# Patient Record
Sex: Female | Born: 1940 | Race: Black or African American | Hispanic: No | Smoking: Former smoker
Health system: Southern US, Community
[De-identification: ages and names within clinical notes are randomized; demographics above are authoritative.]

## PROBLEM LIST (undated history)

## (undated) DIAGNOSIS — E13319 Other specified diabetes mellitus with unspecified diabetic retinopathy without macular edema: Secondary | ICD-10-CM

## (undated) DIAGNOSIS — O039 Complete or unspecified spontaneous abortion without complication: Secondary | ICD-10-CM

## (undated) DIAGNOSIS — M545 Low back pain, unspecified: Secondary | ICD-10-CM

## (undated) DIAGNOSIS — I219 Acute myocardial infarction, unspecified: Secondary | ICD-10-CM

## (undated) DIAGNOSIS — N19 Unspecified kidney failure: Secondary | ICD-10-CM

## (undated) DIAGNOSIS — I499 Cardiac arrhythmia, unspecified: Secondary | ICD-10-CM

## (undated) DIAGNOSIS — F419 Anxiety disorder, unspecified: Secondary | ICD-10-CM

## (undated) DIAGNOSIS — I10 Essential (primary) hypertension: Secondary | ICD-10-CM

## (undated) DIAGNOSIS — N289 Disorder of kidney and ureter, unspecified: Secondary | ICD-10-CM

## (undated) DIAGNOSIS — Z860100 Personal history of colon polyps, unspecified: Secondary | ICD-10-CM

## (undated) DIAGNOSIS — E119 Type 2 diabetes mellitus without complications: Secondary | ICD-10-CM

## (undated) DIAGNOSIS — D649 Anemia, unspecified: Secondary | ICD-10-CM

## (undated) DIAGNOSIS — IMO0001 Reserved for inherently not codable concepts without codable children: Secondary | ICD-10-CM

## (undated) DIAGNOSIS — K219 Gastro-esophageal reflux disease without esophagitis: Secondary | ICD-10-CM

## (undated) DIAGNOSIS — E1321 Other specified diabetes mellitus with diabetic nephropathy: Secondary | ICD-10-CM

## (undated) DIAGNOSIS — E785 Hyperlipidemia, unspecified: Secondary | ICD-10-CM

## (undated) DIAGNOSIS — M109 Gout, unspecified: Secondary | ICD-10-CM

## (undated) DIAGNOSIS — G629 Polyneuropathy, unspecified: Secondary | ICD-10-CM

## (undated) DIAGNOSIS — Z992 Dependence on renal dialysis: Secondary | ICD-10-CM

## (undated) DIAGNOSIS — I2510 Atherosclerotic heart disease of native coronary artery without angina pectoris: Secondary | ICD-10-CM

## (undated) HISTORY — DX: Gastro-esophageal reflux disease without esophagitis: K21.9

## (undated) HISTORY — DX: Personal history of colonic polyps: Z86.010

## (undated) HISTORY — DX: Essential (primary) hypertension: I10

## (undated) HISTORY — DX: Type 2 diabetes mellitus without complications: E11.9

## (undated) HISTORY — PX: CHOLECYSTECTOMY: SHX55

## (undated) HISTORY — PX: PERITONEAL CATHETER INSERTION: SHX2223

## (undated) HISTORY — DX: Hyperlipidemia, unspecified: E78.5

## (undated) HISTORY — DX: Disorder of kidney and ureter, unspecified: N28.9

## (undated) HISTORY — DX: Personal history of colon polyps, unspecified: Z86.0100

## (undated) HISTORY — DX: Other specified diabetes mellitus with unspecified diabetic retinopathy without macular edema: E13.319

## (undated) HISTORY — DX: Other specified diabetes mellitus with diabetic nephropathy: E13.21

## (undated) HISTORY — DX: Anemia, unspecified: D64.9

## (undated) HISTORY — DX: Reserved for inherently not codable concepts without codable children: IMO0001

## (undated) HISTORY — DX: Unspecified kidney failure: N19

## (undated) HISTORY — DX: Complete or unspecified spontaneous abortion without complication: O03.9

---

## 1981-11-05 HISTORY — PX: ABDOMINAL HYSTERECTOMY: SHX81

## 1985-11-05 HISTORY — PX: BACK SURGERY: SHX140

## 2004-08-05 ENCOUNTER — Ambulatory Visit: Payer: Self-pay | Admitting: Internal Medicine

## 2004-09-21 ENCOUNTER — Ambulatory Visit: Payer: Self-pay | Admitting: Internal Medicine

## 2004-10-16 ENCOUNTER — Ambulatory Visit: Payer: Self-pay | Admitting: Internal Medicine

## 2004-11-05 ENCOUNTER — Ambulatory Visit: Payer: Self-pay | Admitting: Internal Medicine

## 2004-12-06 ENCOUNTER — Ambulatory Visit: Payer: Self-pay | Admitting: Internal Medicine

## 2005-01-17 ENCOUNTER — Ambulatory Visit: Payer: Self-pay | Admitting: Internal Medicine

## 2005-01-19 ENCOUNTER — Ambulatory Visit: Payer: Self-pay | Admitting: Internal Medicine

## 2005-02-03 ENCOUNTER — Ambulatory Visit: Payer: Self-pay | Admitting: Internal Medicine

## 2005-03-05 ENCOUNTER — Ambulatory Visit: Payer: Self-pay | Admitting: Internal Medicine

## 2005-03-07 ENCOUNTER — Ambulatory Visit: Payer: Self-pay | Admitting: Internal Medicine

## 2005-05-04 ENCOUNTER — Ambulatory Visit: Payer: Self-pay | Admitting: Gastroenterology

## 2005-05-23 ENCOUNTER — Ambulatory Visit: Payer: Self-pay | Admitting: Internal Medicine

## 2005-05-30 ENCOUNTER — Ambulatory Visit: Payer: Self-pay | Admitting: Internal Medicine

## 2005-06-14 ENCOUNTER — Ambulatory Visit: Payer: Self-pay | Admitting: Internal Medicine

## 2005-07-06 ENCOUNTER — Ambulatory Visit: Payer: Self-pay | Admitting: Internal Medicine

## 2005-08-05 ENCOUNTER — Ambulatory Visit: Payer: Self-pay | Admitting: Internal Medicine

## 2005-09-07 ENCOUNTER — Ambulatory Visit: Payer: Self-pay | Admitting: Internal Medicine

## 2005-10-16 ENCOUNTER — Ambulatory Visit: Payer: Self-pay | Admitting: Internal Medicine

## 2005-11-02 ENCOUNTER — Ambulatory Visit: Payer: Self-pay | Admitting: Family Medicine

## 2006-01-01 ENCOUNTER — Ambulatory Visit: Payer: Self-pay | Admitting: Family Medicine

## 2006-01-17 ENCOUNTER — Ambulatory Visit: Payer: Self-pay | Admitting: Internal Medicine

## 2006-02-03 ENCOUNTER — Ambulatory Visit: Payer: Self-pay | Admitting: Internal Medicine

## 2006-02-14 ENCOUNTER — Ambulatory Visit: Payer: Self-pay | Admitting: Internal Medicine

## 2006-05-01 ENCOUNTER — Ambulatory Visit: Payer: Self-pay | Admitting: Internal Medicine

## 2006-05-05 ENCOUNTER — Ambulatory Visit: Payer: Self-pay | Admitting: Internal Medicine

## 2006-06-03 ENCOUNTER — Ambulatory Visit: Payer: Self-pay | Admitting: Gastroenterology

## 2006-06-03 LAB — HM COLONOSCOPY

## 2006-06-05 ENCOUNTER — Ambulatory Visit: Payer: Self-pay | Admitting: Internal Medicine

## 2006-08-19 ENCOUNTER — Ambulatory Visit: Payer: Self-pay | Admitting: Internal Medicine

## 2006-08-27 ENCOUNTER — Ambulatory Visit: Payer: Self-pay

## 2006-09-22 ENCOUNTER — Other Ambulatory Visit: Payer: Self-pay

## 2006-09-22 ENCOUNTER — Emergency Department: Payer: Self-pay | Admitting: Emergency Medicine

## 2006-10-23 ENCOUNTER — Ambulatory Visit: Payer: Self-pay | Admitting: Internal Medicine

## 2006-11-22 ENCOUNTER — Ambulatory Visit: Payer: Self-pay | Admitting: Internal Medicine

## 2006-11-28 ENCOUNTER — Ambulatory Visit: Payer: Self-pay | Admitting: Family Medicine

## 2006-12-06 ENCOUNTER — Ambulatory Visit: Payer: Self-pay | Admitting: Internal Medicine

## 2007-01-14 ENCOUNTER — Ambulatory Visit: Payer: Self-pay | Admitting: Internal Medicine

## 2007-01-20 ENCOUNTER — Encounter: Payer: Self-pay | Admitting: Orthopedic Surgery

## 2007-02-04 ENCOUNTER — Encounter: Payer: Self-pay | Admitting: Orthopedic Surgery

## 2007-02-19 ENCOUNTER — Ambulatory Visit: Payer: Self-pay | Admitting: Internal Medicine

## 2007-02-19 LAB — CONVERTED CEMR LAB
ALT: 18 units/L (ref 0–40)
Albumin: 3.2 g/dL — ABNORMAL LOW (ref 3.5–5.2)
BUN: 35 mg/dL — ABNORMAL HIGH (ref 6–23)
CO2: 27 meq/L (ref 19–32)
Cholesterol: 137 mg/dL (ref 0–200)
Creatinine,U: 147.5 mg/dL
Eosinophils Absolute: 0.2 10*3/uL (ref 0.0–0.6)
GFR calc non Af Amer: 30 mL/min
HDL: 52.8 mg/dL (ref >39.0)
Hemoglobin: 8.9 g/dL — ABNORMAL LOW (ref 12.0–15.0)
Hgb A1c MFr Bld: 7 % — ABNORMAL HIGH (ref 4.6–6.0)
Lymphocytes Relative: 29.4 % (ref 12.0–46.0)
MCHC: 34.3 g/dL (ref 30.0–36.0)
MCV: 94.2 fL (ref 78.0–100.0)
Monocytes Absolute: 0.5 10*3/uL (ref 0.2–0.7)
Monocytes Relative: 8 % (ref 3.0–11.0)
Neutro Abs: 4 10*3/uL (ref 1.4–7.7)
Phosphorus: 4.2 mg/dL (ref 2.3–4.6)
Platelets: 235 10*3/uL (ref 150–400)
Potassium: 4.1 meq/L (ref 3.5–5.1)
Sodium: 143 meq/L (ref 135–145)
Triglycerides: 93 mg/dL (ref 0–149)

## 2007-02-22 ENCOUNTER — Emergency Department: Payer: Self-pay | Admitting: Emergency Medicine

## 2007-02-25 ENCOUNTER — Emergency Department: Payer: Self-pay | Admitting: Emergency Medicine

## 2007-02-28 ENCOUNTER — Encounter: Payer: Self-pay | Admitting: Internal Medicine

## 2007-02-28 ENCOUNTER — Telehealth (INDEPENDENT_AMBULATORY_CARE_PROVIDER_SITE_OTHER): Payer: Self-pay | Admitting: *Deleted

## 2007-02-28 DIAGNOSIS — M545 Low back pain: Secondary | ICD-10-CM

## 2007-03-06 ENCOUNTER — Encounter: Payer: Self-pay | Admitting: Orthopedic Surgery

## 2007-03-11 ENCOUNTER — Telehealth (INDEPENDENT_AMBULATORY_CARE_PROVIDER_SITE_OTHER): Payer: Self-pay | Admitting: *Deleted

## 2007-03-12 ENCOUNTER — Ambulatory Visit: Payer: Self-pay | Admitting: Internal Medicine

## 2007-03-12 LAB — CONVERTED CEMR LAB
Glucose, Urine, Semiquant: NEGATIVE
Ketones, urine, test strip: NEGATIVE
Urobilinogen, UA: 0.2
pH: 5

## 2007-03-13 ENCOUNTER — Encounter: Admission: RE | Admit: 2007-03-13 | Discharge: 2007-03-13 | Payer: Self-pay | Admitting: Orthopedic Surgery

## 2007-04-06 HISTORY — PX: BACK SURGERY: SHX140

## 2007-04-17 ENCOUNTER — Inpatient Hospital Stay (HOSPITAL_COMMUNITY): Admission: RE | Admit: 2007-04-17 | Discharge: 2007-04-19 | Payer: Self-pay | Admitting: Orthopedic Surgery

## 2007-05-06 ENCOUNTER — Ambulatory Visit: Payer: Self-pay | Admitting: Internal Medicine

## 2007-05-14 DIAGNOSIS — I10 Essential (primary) hypertension: Secondary | ICD-10-CM

## 2007-05-14 DIAGNOSIS — E785 Hyperlipidemia, unspecified: Secondary | ICD-10-CM | POA: Insufficient documentation

## 2007-05-14 DIAGNOSIS — D539 Nutritional anemia, unspecified: Secondary | ICD-10-CM | POA: Insufficient documentation

## 2007-05-21 ENCOUNTER — Ambulatory Visit: Payer: Self-pay | Admitting: Internal Medicine

## 2007-05-22 LAB — CONVERTED CEMR LAB
Albumin: 3.2 g/dL — ABNORMAL LOW (ref 3.5–5.2)
Basophils Relative: 0.7 % (ref 0.0–1.0)
CO2: 27 meq/L (ref 19–32)
Chloride: 111 meq/L (ref 96–112)
Creatinine, Ser: 1.9 mg/dL — ABNORMAL HIGH (ref 0.4–1.2)
Eosinophils Relative: 3.8 % (ref 0.0–5.0)
GFR calc non Af Amer: 28 mL/min
Glucose, Bld: 134 mg/dL — ABNORMAL HIGH (ref 70–99)
HCT: 25.5 % — ABNORMAL LOW (ref 36.0–46.0)
HDL: 42.4 mg/dL (ref >39.0)
Hemoglobin: 8.8 g/dL — ABNORMAL LOW (ref 12.0–15.0)
LDL Cholesterol: 74 mg/dL (ref 0–99)
Lymphocytes Relative: 33 % (ref 12.0–46.0)
Monocytes Absolute: 0.5 10*3/uL (ref 0.2–0.7)
Neutro Abs: 3.6 10*3/uL (ref 1.4–7.7)
Neutrophils Relative %: 54.4 % (ref 43.0–77.0)
Phosphorus: 3.6 mg/dL (ref 2.3–4.6)
RDW: 15.2 % — ABNORMAL HIGH (ref 11.5–14.6)
Sodium: 147 meq/L — ABNORMAL HIGH (ref 135–145)
TSH: 1.15 microintl units/mL (ref 0.35–5.50)
VLDL: 19 mg/dL (ref 0–40)
WBC: 6.6 10*3/uL (ref 4.5–10.5)

## 2007-06-05 ENCOUNTER — Encounter: Payer: Self-pay | Admitting: Internal Medicine

## 2007-06-06 ENCOUNTER — Ambulatory Visit: Payer: Self-pay | Admitting: Internal Medicine

## 2007-06-10 ENCOUNTER — Encounter: Payer: Self-pay | Admitting: Orthopedic Surgery

## 2007-06-27 ENCOUNTER — Ambulatory Visit: Payer: Self-pay | Admitting: Internal Medicine

## 2007-07-07 ENCOUNTER — Ambulatory Visit: Payer: Self-pay | Admitting: Internal Medicine

## 2007-07-07 ENCOUNTER — Encounter: Payer: Self-pay | Admitting: Orthopedic Surgery

## 2007-07-18 ENCOUNTER — Ambulatory Visit: Payer: Self-pay | Admitting: Family Medicine

## 2007-07-18 DIAGNOSIS — J0110 Acute frontal sinusitis, unspecified: Secondary | ICD-10-CM

## 2007-08-06 ENCOUNTER — Encounter: Payer: Self-pay | Admitting: Orthopedic Surgery

## 2007-09-25 ENCOUNTER — Ambulatory Visit: Payer: Self-pay | Admitting: Internal Medicine

## 2007-10-06 ENCOUNTER — Ambulatory Visit: Payer: Self-pay | Admitting: Internal Medicine

## 2007-10-22 ENCOUNTER — Ambulatory Visit: Payer: Self-pay | Admitting: Internal Medicine

## 2007-10-23 LAB — CONVERTED CEMR LAB
Albumin: 3.6 g/dL (ref 3.5–5.2)
BUN: 45 mg/dL — ABNORMAL HIGH (ref 6–23)
Basophils Absolute: 0 10*3/uL (ref 0.0–0.1)
Bilirubin, Direct: 0.1 mg/dL (ref 0.0–0.3)
Chloride: 109 meq/L (ref 96–112)
Cholesterol: 143 mg/dL (ref 0–200)
Creatinine, Ser: 2.1 mg/dL — ABNORMAL HIGH (ref 0.4–1.2)
Eosinophils Absolute: 0.4 10*3/uL (ref 0.0–0.6)
GFR calc non Af Amer: 25 mL/min
HCT: 29.5 % — ABNORMAL LOW (ref 36.0–46.0)
HDL: 47.2 mg/dL (ref >39.0)
Hgb A1c MFr Bld: 7.6 % — ABNORMAL HIGH (ref 4.6–6.0)
MCHC: 33.3 g/dL (ref 30.0–36.0)
MCV: 92.2 fL (ref 78.0–100.0)
Monocytes Absolute: 0.5 10*3/uL (ref 0.2–0.7)
Monocytes Relative: 6.9 % (ref 3.0–11.0)
Neutrophils Relative %: 56.8 % (ref 43.0–77.0)
RBC: 3.2 M/uL — ABNORMAL LOW (ref 3.87–5.11)
TSH: 0.98 microintl units/mL (ref 0.35–5.50)
Total CHOL/HDL Ratio: 3

## 2007-11-27 ENCOUNTER — Encounter: Payer: Self-pay | Admitting: Internal Medicine

## 2007-11-27 ENCOUNTER — Ambulatory Visit: Payer: Self-pay | Admitting: Internal Medicine

## 2007-12-01 ENCOUNTER — Encounter (INDEPENDENT_AMBULATORY_CARE_PROVIDER_SITE_OTHER): Payer: Self-pay | Admitting: *Deleted

## 2007-12-07 ENCOUNTER — Ambulatory Visit: Payer: Self-pay | Admitting: Internal Medicine

## 2008-01-20 ENCOUNTER — Telehealth (INDEPENDENT_AMBULATORY_CARE_PROVIDER_SITE_OTHER): Payer: Self-pay | Admitting: *Deleted

## 2008-02-04 ENCOUNTER — Ambulatory Visit: Payer: Self-pay | Admitting: Internal Medicine

## 2008-02-17 ENCOUNTER — Telehealth: Payer: Self-pay | Admitting: Family Medicine

## 2008-02-23 ENCOUNTER — Ambulatory Visit: Payer: Self-pay | Admitting: Internal Medicine

## 2008-02-23 DIAGNOSIS — K219 Gastro-esophageal reflux disease without esophagitis: Secondary | ICD-10-CM

## 2008-02-26 LAB — CONVERTED CEMR LAB
ALT: 16 units/L (ref 0–35)
Basophils Absolute: 0.1 10*3/uL (ref 0.0–0.1)
Cholesterol: 148 mg/dL (ref 0–200)
Glucose, Bld: 218 mg/dL — ABNORMAL HIGH (ref 70–99)
HCT: 30.9 % — ABNORMAL LOW (ref 36.0–46.0)
Hemoglobin: 9.9 g/dL — ABNORMAL LOW (ref 12.0–15.0)
Hgb A1c MFr Bld: 8.6 % — ABNORMAL HIGH (ref 4.6–6.0)
MCHC: 32 g/dL (ref 30.0–36.0)
MCV: 95.5 fL (ref 78.0–100.0)
Monocytes Absolute: 0.4 10*3/uL (ref 0.1–1.0)
Monocytes Relative: 6.6 % (ref 3.0–12.0)
Neutro Abs: 3.6 10*3/uL (ref 1.4–7.7)
Phosphorus: 3.6 mg/dL (ref 2.3–4.6)
Potassium: 4 meq/L (ref 3.5–5.1)
RDW: 13.6 % (ref 11.5–14.6)
Sodium: 145 meq/L (ref 135–145)
TSH: 0.68 microintl units/mL (ref 0.35–5.50)
Total Bilirubin: 0.6 mg/dL (ref 0.3–1.2)
Triglycerides: 125 mg/dL (ref 0–149)

## 2008-02-27 ENCOUNTER — Telehealth: Payer: Self-pay | Admitting: Internal Medicine

## 2008-03-26 ENCOUNTER — Ambulatory Visit: Payer: Self-pay | Admitting: Internal Medicine

## 2008-04-05 ENCOUNTER — Ambulatory Visit: Payer: Self-pay | Admitting: Internal Medicine

## 2008-06-05 ENCOUNTER — Ambulatory Visit: Payer: Self-pay | Admitting: Internal Medicine

## 2008-06-25 ENCOUNTER — Ambulatory Visit: Payer: Self-pay | Admitting: Internal Medicine

## 2008-06-28 ENCOUNTER — Telehealth: Payer: Self-pay | Admitting: Internal Medicine

## 2008-06-29 ENCOUNTER — Ambulatory Visit: Payer: Self-pay | Admitting: Internal Medicine

## 2008-07-05 LAB — CONVERTED CEMR LAB
CO2: 26 meq/L (ref 19–32)
Chloride: 106 meq/L (ref 96–112)
GFR calc non Af Amer: 19 mL/min
Glucose, Bld: 174 mg/dL — ABNORMAL HIGH (ref 70–99)
Hemoglobin: 10.5 g/dL — ABNORMAL LOW (ref 12.0–15.0)
Hgb A1c MFr Bld: 7.2 % — ABNORMAL HIGH (ref 4.6–6.0)
Lymphocytes Relative: 31.1 % (ref 12.0–46.0)
Monocytes Relative: 7.1 % (ref 3.0–12.0)
Platelets: 230 10*3/uL (ref 150–400)
Potassium: 4.3 meq/L (ref 3.5–5.1)
RDW: 13.2 % (ref 11.5–14.6)
Sodium: 144 meq/L (ref 135–145)

## 2008-07-06 ENCOUNTER — Ambulatory Visit: Payer: Self-pay | Admitting: Internal Medicine

## 2008-07-08 ENCOUNTER — Telehealth: Payer: Self-pay | Admitting: Internal Medicine

## 2008-10-05 ENCOUNTER — Telehealth: Payer: Self-pay | Admitting: Internal Medicine

## 2008-10-25 ENCOUNTER — Ambulatory Visit: Payer: Self-pay | Admitting: Internal Medicine

## 2008-10-25 DIAGNOSIS — K5289 Other specified noninfective gastroenteritis and colitis: Secondary | ICD-10-CM | POA: Insufficient documentation

## 2008-10-27 LAB — CONVERTED CEMR LAB
BUN: 49 mg/dL — ABNORMAL HIGH (ref 6–23)
Bilirubin, Direct: 0.1 mg/dL (ref 0.0–0.3)
Chloride: 106 meq/L (ref 96–112)
Eosinophils Absolute: 0.3 10*3/uL (ref 0.0–0.7)
Eosinophils Relative: 4.3 % (ref 0.0–5.0)
GFR calc Af Amer: 24 mL/min
HCT: 30.3 % — ABNORMAL LOW (ref 36.0–46.0)
HDL: 40.1 mg/dL (ref >39.0)
Hgb A1c MFr Bld: 9.7 % — ABNORMAL HIGH (ref 4.6–6.0)
MCV: 94.9 fL (ref 78.0–100.0)
Monocytes Absolute: 0.6 10*3/uL (ref 0.1–1.0)
Neutro Abs: 3.5 10*3/uL (ref 1.4–7.7)
Platelets: 181 10*3/uL (ref 150–400)
Potassium: 3.6 meq/L (ref 3.5–5.1)
RDW: 12.5 % (ref 11.5–14.6)
Sodium: 141 meq/L (ref 135–145)
TSH: 1.21 microintl units/mL (ref 0.35–5.50)
Total Bilirubin: 0.8 mg/dL (ref 0.3–1.2)
Total Protein: 6.6 g/dL (ref 6.0–8.3)
Triglycerides: 126 mg/dL (ref 0–149)

## 2008-11-15 ENCOUNTER — Ambulatory Visit: Payer: Self-pay | Admitting: Nephrology

## 2008-11-22 ENCOUNTER — Encounter: Payer: Self-pay | Admitting: Internal Medicine

## 2008-11-23 ENCOUNTER — Telehealth: Payer: Self-pay | Admitting: Internal Medicine

## 2008-11-25 ENCOUNTER — Ambulatory Visit: Payer: Self-pay | Admitting: Family Medicine

## 2008-11-25 DIAGNOSIS — E119 Type 2 diabetes mellitus without complications: Secondary | ICD-10-CM

## 2008-11-30 ENCOUNTER — Telehealth: Payer: Self-pay | Admitting: Internal Medicine

## 2008-12-08 ENCOUNTER — Encounter: Payer: Self-pay | Admitting: Internal Medicine

## 2008-12-08 ENCOUNTER — Telehealth: Payer: Self-pay | Admitting: Internal Medicine

## 2009-01-12 ENCOUNTER — Encounter: Payer: Self-pay | Admitting: Internal Medicine

## 2009-01-17 ENCOUNTER — Encounter: Payer: Self-pay | Admitting: Internal Medicine

## 2009-01-26 ENCOUNTER — Telehealth: Payer: Self-pay | Admitting: Internal Medicine

## 2009-01-27 ENCOUNTER — Encounter: Payer: Self-pay | Admitting: Internal Medicine

## 2009-01-27 ENCOUNTER — Ambulatory Visit: Payer: Self-pay | Admitting: *Deleted

## 2009-01-28 ENCOUNTER — Encounter: Payer: Self-pay | Admitting: Internal Medicine

## 2009-02-15 ENCOUNTER — Ambulatory Visit: Payer: Self-pay | Admitting: Internal Medicine

## 2009-02-15 DIAGNOSIS — G479 Sleep disorder, unspecified: Secondary | ICD-10-CM | POA: Insufficient documentation

## 2009-02-15 DIAGNOSIS — M109 Gout, unspecified: Secondary | ICD-10-CM

## 2009-02-16 LAB — CONVERTED CEMR LAB
Albumin: 3.5 g/dL (ref 3.5–5.2)
BUN: 42 mg/dL — ABNORMAL HIGH (ref 6–23)
Basophils Absolute: 0 10*3/uL (ref 0.0–0.1)
Chloride: 110 meq/L (ref 96–112)
Creatinine, Ser: 2.4 mg/dL — ABNORMAL HIGH (ref 0.4–1.2)
Eosinophils Relative: 1.9 % (ref 0.0–5.0)
Glucose, Bld: 185 mg/dL — ABNORMAL HIGH (ref 70–99)
HCT: 32.6 % — ABNORMAL LOW (ref 36.0–46.0)
Hgb A1c MFr Bld: 7.7 % — ABNORMAL HIGH (ref 4.6–6.5)
Lymphocytes Relative: 40.9 % (ref 12.0–46.0)
Monocytes Relative: 9.5 % (ref 3.0–12.0)
Neutrophils Relative %: 47.3 % (ref 43.0–77.0)
Phosphorus: 3.7 mg/dL (ref 2.3–4.6)
Platelets: 187 10*3/uL (ref 150.0–400.0)
RDW: 13.6 % (ref 11.5–14.6)
WBC: 4.7 10*3/uL (ref 4.5–10.5)

## 2009-03-02 ENCOUNTER — Encounter: Payer: Self-pay | Admitting: Internal Medicine

## 2009-04-13 ENCOUNTER — Encounter: Payer: Self-pay | Admitting: Internal Medicine

## 2009-06-20 ENCOUNTER — Ambulatory Visit: Payer: Self-pay | Admitting: Internal Medicine

## 2009-06-22 LAB — CONVERTED CEMR LAB
Basophils Relative: 0 % (ref 0.0–3.0)
CO2: 24 meq/L (ref 19–32)
Calcium: 8.9 mg/dL (ref 8.4–10.5)
Creatinine, Ser: 2.3 mg/dL — ABNORMAL HIGH (ref 0.4–1.2)
Eosinophils Absolute: 0.1 10*3/uL (ref 0.0–0.7)
Eosinophils Relative: 1.4 % (ref 0.0–5.0)
Hemoglobin: 10.7 g/dL — ABNORMAL LOW (ref 12.0–15.0)
Lymphocytes Relative: 32.8 % (ref 12.0–46.0)
MCHC: 33.8 g/dL (ref 30.0–36.0)
MCV: 99.1 fL (ref 78.0–100.0)
Monocytes Absolute: 0.2 10*3/uL (ref 0.1–1.0)
Neutro Abs: 3.8 10*3/uL (ref 1.4–7.7)
Neutrophils Relative %: 62.1 % (ref 43.0–77.0)
Potassium: 4.5 meq/L (ref 3.5–5.1)
RBC: 3.18 M/uL — ABNORMAL LOW (ref 3.87–5.11)
Sodium: 141 meq/L (ref 135–145)
WBC: 6.1 10*3/uL (ref 4.5–10.5)

## 2009-07-04 ENCOUNTER — Encounter: Payer: Self-pay | Admitting: Internal Medicine

## 2009-07-13 ENCOUNTER — Ambulatory Visit: Payer: Self-pay | Admitting: Internal Medicine

## 2009-07-13 ENCOUNTER — Encounter: Payer: Self-pay | Admitting: Internal Medicine

## 2009-07-15 ENCOUNTER — Encounter: Payer: Self-pay | Admitting: Internal Medicine

## 2009-07-20 ENCOUNTER — Telehealth: Payer: Self-pay | Admitting: Internal Medicine

## 2009-08-05 ENCOUNTER — Ambulatory Visit: Payer: Self-pay | Admitting: Internal Medicine

## 2009-08-18 ENCOUNTER — Encounter: Payer: Self-pay | Admitting: Internal Medicine

## 2009-08-18 ENCOUNTER — Ambulatory Visit: Payer: Self-pay | Admitting: Internal Medicine

## 2009-09-05 ENCOUNTER — Ambulatory Visit: Payer: Self-pay | Admitting: Internal Medicine

## 2009-09-12 ENCOUNTER — Encounter: Payer: Self-pay | Admitting: Internal Medicine

## 2009-11-23 ENCOUNTER — Encounter: Payer: Self-pay | Admitting: Internal Medicine

## 2009-12-09 ENCOUNTER — Telehealth: Payer: Self-pay | Admitting: Internal Medicine

## 2009-12-12 ENCOUNTER — Encounter: Payer: Self-pay | Admitting: Internal Medicine

## 2009-12-13 ENCOUNTER — Ambulatory Visit: Payer: Self-pay | Admitting: Internal Medicine

## 2009-12-13 DIAGNOSIS — G589 Mononeuropathy, unspecified: Secondary | ICD-10-CM | POA: Insufficient documentation

## 2009-12-13 DIAGNOSIS — R002 Palpitations: Secondary | ICD-10-CM | POA: Insufficient documentation

## 2009-12-13 DIAGNOSIS — E119 Type 2 diabetes mellitus without complications: Secondary | ICD-10-CM | POA: Insufficient documentation

## 2009-12-19 ENCOUNTER — Encounter: Payer: Self-pay | Admitting: Internal Medicine

## 2009-12-19 ENCOUNTER — Ambulatory Visit: Payer: Self-pay | Admitting: Internal Medicine

## 2009-12-20 LAB — CONVERTED CEMR LAB
Albumin: 3.2 g/dL — ABNORMAL LOW (ref 3.5–5.2)
Eosinophils Relative: 3.7 % (ref 0.0–5.0)
HCT: 30.9 % — ABNORMAL LOW (ref 36.0–46.0)
HDL: 57.6 mg/dL (ref >39.00)
Hemoglobin: 10.3 g/dL — ABNORMAL LOW (ref 12.0–15.0)
Hgb A1c MFr Bld: 9.4 % — ABNORMAL HIGH (ref 4.6–6.5)
LDL Cholesterol: 110 mg/dL — ABNORMAL HIGH (ref 0–99)
Lymphs Abs: 1.8 10*3/uL (ref 0.7–4.0)
MCV: 98.1 fL (ref 78.0–100.0)
Monocytes Absolute: 0.6 10*3/uL (ref 0.1–1.0)
Monocytes Relative: 9.7 % (ref 3.0–12.0)
Neutro Abs: 3.2 10*3/uL (ref 1.4–7.7)
Total Bilirubin: 0.7 mg/dL (ref 0.3–1.2)
Total CHOL/HDL Ratio: 3
Triglycerides: 161 mg/dL — ABNORMAL HIGH (ref 0.0–149.0)
VLDL: 32.2 mg/dL (ref 0.0–40.0)
WBC: 5.8 10*3/uL (ref 4.5–10.5)

## 2010-01-16 ENCOUNTER — Encounter: Payer: Self-pay | Admitting: Internal Medicine

## 2010-02-03 ENCOUNTER — Ambulatory Visit: Payer: Self-pay | Admitting: Internal Medicine

## 2010-02-06 ENCOUNTER — Encounter: Payer: Self-pay | Admitting: Internal Medicine

## 2010-02-20 ENCOUNTER — Encounter: Payer: Self-pay | Admitting: Internal Medicine

## 2010-02-20 ENCOUNTER — Ambulatory Visit: Payer: Self-pay | Admitting: Internal Medicine

## 2010-03-01 ENCOUNTER — Encounter: Payer: Self-pay | Admitting: Internal Medicine

## 2010-03-05 ENCOUNTER — Ambulatory Visit: Payer: Self-pay | Admitting: Internal Medicine

## 2010-03-08 ENCOUNTER — Telehealth: Payer: Self-pay | Admitting: Internal Medicine

## 2010-03-08 ENCOUNTER — Encounter: Payer: Self-pay | Admitting: Internal Medicine

## 2010-03-13 ENCOUNTER — Ambulatory Visit: Payer: Self-pay | Admitting: Internal Medicine

## 2010-03-15 LAB — CONVERTED CEMR LAB: Hgb A1c MFr Bld: 8.5 % — ABNORMAL HIGH (ref 4.6–6.5)

## 2010-03-17 ENCOUNTER — Telehealth: Payer: Self-pay | Admitting: Internal Medicine

## 2010-03-28 ENCOUNTER — Encounter: Payer: Self-pay | Admitting: Internal Medicine

## 2010-05-26 ENCOUNTER — Encounter: Payer: Self-pay | Admitting: Internal Medicine

## 2010-06-21 ENCOUNTER — Ambulatory Visit: Payer: Self-pay | Admitting: Internal Medicine

## 2010-06-23 LAB — CONVERTED CEMR LAB: Hgb A1c MFr Bld: 8.9 % — ABNORMAL HIGH (ref 4.6–6.5)

## 2010-07-21 ENCOUNTER — Encounter: Payer: Self-pay | Admitting: Internal Medicine

## 2010-07-21 ENCOUNTER — Ambulatory Visit: Payer: Self-pay | Admitting: Internal Medicine

## 2010-07-24 ENCOUNTER — Encounter: Payer: Self-pay | Admitting: Internal Medicine

## 2010-07-31 ENCOUNTER — Encounter: Payer: Self-pay | Admitting: Internal Medicine

## 2010-08-01 ENCOUNTER — Ambulatory Visit: Payer: Self-pay | Admitting: Vascular Surgery

## 2010-08-02 ENCOUNTER — Observation Stay: Payer: Self-pay | Admitting: Nephrology

## 2010-08-03 ENCOUNTER — Ambulatory Visit: Payer: Self-pay | Admitting: Vascular Surgery

## 2010-08-24 ENCOUNTER — Telehealth: Payer: Self-pay | Admitting: Internal Medicine

## 2010-09-08 ENCOUNTER — Telehealth: Payer: Self-pay | Admitting: Internal Medicine

## 2010-09-19 ENCOUNTER — Encounter: Payer: Self-pay | Admitting: Internal Medicine

## 2010-09-27 ENCOUNTER — Telehealth: Payer: Self-pay | Admitting: Internal Medicine

## 2010-10-05 ENCOUNTER — Ambulatory Visit: Payer: Self-pay | Admitting: Internal Medicine

## 2010-10-06 LAB — CONVERTED CEMR LAB
ALT: 45 units/L — ABNORMAL HIGH (ref 0–35)
AST: 43 units/L — ABNORMAL HIGH (ref 0–37)
Albumin: 2.9 g/dL — ABNORMAL LOW (ref 3.5–5.2)
Alkaline Phosphatase: 155 units/L — ABNORMAL HIGH (ref 39–117)
Cholesterol: 196 mg/dL (ref 0–200)
Total CHOL/HDL Ratio: 5
Total Protein: 5.8 g/dL — ABNORMAL LOW (ref 6.0–8.3)
Triglycerides: 191 mg/dL — ABNORMAL HIGH (ref 0.0–149.0)

## 2010-10-23 ENCOUNTER — Ambulatory Visit: Payer: Self-pay | Admitting: Internal Medicine

## 2010-10-23 DIAGNOSIS — N186 End stage renal disease: Secondary | ICD-10-CM | POA: Insufficient documentation

## 2010-10-23 LAB — HM DIABETES FOOT EXAM

## 2010-10-24 LAB — CONVERTED CEMR LAB
AST: 22 units/L (ref 0–37)
Albumin: 3.1 g/dL — ABNORMAL LOW (ref 3.5–5.2)
Free T4: 0.83 ng/dL (ref 0.60–1.60)
Hgb A1c MFr Bld: 9.4 % — ABNORMAL HIGH (ref 4.6–6.5)

## 2010-11-07 ENCOUNTER — Encounter (INDEPENDENT_AMBULATORY_CARE_PROVIDER_SITE_OTHER): Payer: Self-pay | Admitting: *Deleted

## 2010-12-05 NOTE — Letter (Signed)
Summary: [name]   Imported By: Edmonia James 04/10/2010 08:47:45  _____________________________________________________________________  External Attachment:    Type:   Image     Comment:   External Document  Appended Document: [name] withdrawn from follow up with Dr Inez Pilgrim chronic anemia from CRF sees Dr Holley Raring

## 2010-12-05 NOTE — Letter (Signed)
Summary: [facility]   Imported By: Edmonia James 03/16/2010 10:26:44  _____________________________________________________________________  External Attachment:    Type:   Image     Comment:   External Document

## 2010-12-05 NOTE — Letter (Signed)
Summary: Results Follow up Letter  [place] [facility]  [address] [place], [place] [address]   Phone: [phone]  Fax: [phone]    07/24/2010 MRN: [id number]  Laurie Steele [address] [place],   [address]  Dear Laurie Steele,  The following are the results of your recent test(s):  Test         Result    Pap Smear:        Normal _____  Not Normal _____ Comments: ______________________________________________________ Cholesterol: LDL(Bad cholesterol):         Your goal is less than:         HDL (Good cholesterol):       Your goal is more than: Comments:  ______________________________________________________ Mammogram:        Normal __X___  Not Normal _____ Comments: mammo is fine repeat recommended in 1-2 years  ___________________________________________________________________ Hemoccult:        Normal _____  Not normal _______ Comments:    _____________________________________________________________________ Other Tests:    We routinely do not discuss normal results over the telephone.  If you desire a copy of the results, or you have any questions about this information we can discuss them at your next office visit.   Sincerely,      Viviana Simpler, MD

## 2010-12-05 NOTE — Assessment & Plan Note (Signed)
Summary: 3MTH FOLLOW UP / LFW   Vital Signs:  Patient profile:   70 year old female Weight:      214 pounds Temp:     97.6 degrees F oral Pulse rate:   68 / minute Pulse rhythm:   regular BP sitting:   156 / 80  (left arm) Cuff size:   large  Vitals Entered By: Edwin Dada CMA Deborra Medina) (Mar 13, 2010 10:07 AM) CC: check blood pressure   History of Present Illness: Has increased insulin up to 50 units Finished the diabetic counselling tried to follow diet but has some practical problems with it Seems that if she ate through the day as they recommended, her sugars were higher in PM AM  81-174  PM 80-332 Did have one hypoglycemic reaction--she has tried to follow a better regimen for her  Renal function has worsened Has decided on peritoneal dialysis--att least to start  BP up last week Off lisinopril since last visit Saw CMA for BP viist last week --no changes made  Has red area on tongue for over a month no pain  Allergies: No Known Drug Allergies  Past History:  Past medical, surgical, family and social histories (including risk factors) reviewed for relevance to current acute and chronic problems.  Past Medical History: Reviewed history from 06/20/2009 and no changes required. Colonic polyps, hx of Hyperlipidemia Hypertension Renal insufficiency----------------------------------------------Dr Lateef NIDDM with retinopathy and nephropathy--------------------Dr Brennan(eyes) Chronic anemia---------------------------------------------------Dr Gittin Glaucoma GERD Gout  Past Surgical History: Reviewed history from 05/21/2007 and no changes required. Cholecystectomy 1997 Hysterectomy 1983 Disc (Back) 1987 Miscarriage/  D & C Vaginal deliveries x 4 Adenosone Cardiolite normal EF 58% 10/07 6/08  Back surgery again ([place])  Family History: Reviewed history from 05/14/2007 and no changes required. Dad died @67   MI Mom with HTN, hyperlipidemia CAD  strong in family  Social History: Reviewed history from 05/14/2007 and no changes required. Retired [employer] 2003 Divorced with 4 children Former Smoker--quit 1992 Alcohol use-no  Review of Systems       did lose 7# since last visit some swelling of left hand over the past week Sleep has been affected by worrying about impending dialysis--this makes her wake up  Physical Exam  General:  alert.  NAD Mouth:  tongue has reddish areas bilaterally at tip of tongue (where papillae gone) No inflammation No distinct lesion No palpable abnormality Neck:  supple, no masses, and no cervical lymphadenopathy.   Lungs:  normal respiratory effort and normal breath sounds.   Heart:  normal rate, regular rhythm, no murmur, and no gallop.   Extremities:  no sig edema Psych:  normally interactive, good eye contact, not anxious appearing, and not depressed appearing.     Impression & Recommendations:  Problem # 1:  DIABETES MELLITUS, TYPE II, UNCONTROLLED (ICD-250.02) Assessment Improved  seems to be better has too much variability on the NPH---will change to lantus  The following medications were removed from the medication list:    Lisinopril 40 Mg Tabs (Lisinopril) .Marland Kitchen... Take one by mouth daily    Humulin N 100 Unit/ml Susp (Insulin isophane human) ..... Inject 50 units in the am and 20 units in the pm Her updated medication list for this problem includes:    Lantus 100 Unit/ml Soln (Insulin glargine) .Marland KitchenMarland KitchenMarland KitchenMarland Kitchen 70 units daily for diabetes    Glipizide Xl 10 Mg Tb24 (Glipizide) .Marland Kitchen... Take one by mouth daily    Diovan 320 Mg Tabs (Valsartan) .Marland Kitchen... Take 1 by mouth once  daily  Labs Reviewed: Creat: 2.3 (06/20/2009)     Last Eye Exam: stable retinopathy (07/06/2009) Reviewed HgBA1c results: 9.4 (12/13/2009)  10.1 (06/20/2009)  Orders: Prescription Created Electronically [phone]) Venipuncture [id number]) TLB-A1C / Hgb A1C (Glycohemoglobin) (83036-A1C)  Problem # 2:  HYPERTENSION  (ICD-401.9) Assessment: Comment Only was up last week but back to her usual no changes off lisinopril now--not sure if any change in renal function with this  The following medications were removed from the medication list:    Lisinopril 40 Mg Tabs (Lisinopril) .Marland Kitchen... Take one by mouth daily Her updated medication list for this problem includes:    Terazosin Hcl 10 Mg Caps (Terazosin hcl) .Marland Kitchen... Take one by mouth daily    Clonidine Hcl 0.3 Mg Tabs (Clonidine hcl) .Marland Kitchen... Take one by mouth two times a day    Diovan 320 Mg Tabs (Valsartan) .Marland Kitchen... Take 1 by mouth once daily  BP today: 156/80 Prior BP: 150/80 (12/13/2009)  Labs Reviewed: K+: 4.5 (06/20/2009) Creat: : 2.3 (06/20/2009)   Chol: 200 (12/13/2009)   HDL: 57.60 (12/13/2009)   LDL: 110 (12/13/2009)   TG: 161.0 (12/13/2009)  Problem # 3:  RENAL INSUFFICIENCY (ICD-588.9) Assessment: Comment Only Dr Holley Raring managing will look to peritoneal dialysis when she needs renal replacement  Complete Medication List: 1)  Lantus 100 Unit/ml Soln (Insulin glargine) .... 70 units daily for diabetes 2)  Terazosin Hcl 10 Mg Caps (Terazosin hcl) .... Take one by mouth daily 3)  Clonidine Hcl 0.3 Mg Tabs (Clonidine hcl) .... Take one by mouth two times a day 4)  Simvastatin 80 Mg Tabs (Simvastatin) .... Take one by mouth daily 5)  Ferrous Sulfate 324 Mg Tbec (Ferrous sulfate) .... Take one by mouth daily 6)  Glipizide Xl 10 Mg Tb24 (Glipizide) .... Take one by mouth daily 7)  Norco 5-325 Mg Tabs (Hydrocodone-acetaminophen) .... Take 1 tablet every 2-4 hours as needed pain 8)  Diovan 320 Mg Tabs (Valsartan) .... Take 1 by mouth once daily 9)  Allopurinol 300 Mg Tabs (Allopurinol) .... Take 1 by mouth once daily as needed 10)  Multivitamins Tabs (Multiple vitamin) .... Take one by mouth daily 11)  Bd Syringes 0.3cc  12)  Freestyle Test Strp (Glucose blood) .... Use twice daily 13)  Freestyle Lancets Misc (Lancets) .... Test  two times a day or as  directed  Patient Instructions: 1)  Please schedule a follow-up appointment in 3 months .  Prescriptions: LANTUS 100 UNIT/ML SOLN (INSULIN GLARGINE) 70 units daily for diabetes  #3000 units x 11   Entered and Authorized by:   Claris Gower MD   Signed by:   Claris Gower MD on 03/13/2010   Method used:   Electronically to        [facility] (retail)       [address]       [place], [place]  [address]       Ph: [phone]       Fax: [phone]   [id number]   Current Allergies (reviewed today): No known allergies

## 2010-12-05 NOTE — Progress Notes (Signed)
Summary: PRAVASTATIN SODIUM  Phone Note Outgoing Call   Summary of Call: Please call patient new information has come out about simvastatin and it is not recommended to be taken along with amlodipine (which she needs for her blood pressure)  Please have her stop the simvastatin and change to pravastatin 80mg  daily (1 year Rx) have her come in for lipid and hepatic profiles in 4-6 weeks Initial call taken by: Claris Gower MD,  August 24, 2010 1:38 PM  Follow-up for Phone Call        Spoke with patient and advised results. Rx sent to Pharmacy, pt will call back to schedule lab appt, pt was sleep. Follow-up by: Edwin Dada CMA Deborra Medina),  August 25, 2010 8:49 AM    New/Updated Medications: PRAVASTATIN SODIUM 80 MG TABS (PRAVASTATIN SODIUM) Take 1 by mouth once daily Prescriptions: PRAVASTATIN SODIUM 80 MG TABS (PRAVASTATIN SODIUM) Take 1 by mouth once daily  #30 x 12   Entered by:   Edwin Dada CMA ([place])   Authorized by:   Claris Gower MD   Signed by:   Edwin Dada CMA ([place]) on 08/25/2010   Method used:   Electronically to        [facility] (retail)       [place], [place]  [address]       Ph: [phone]       Fax: [phone]   RxID:   [id number]    Influenza Immunization History:    Influenza # 1:  Historical (08/16/2010)

## 2010-12-05 NOTE — Consult Note (Signed)
Summary: [facility]   Imported By: Edmonia James 07/03/2010 08:25:20  _____________________________________________________________________  External Attachment:    Type:   Image     Comment:   External Document  Appended Document: [place] stable anemia Hgb 10.3 No symptoms to warrant Rx with procrit

## 2010-12-05 NOTE — Consult Note (Signed)
Summary: [facility]   Imported By: Laural Benes 06/02/2010 10:58:09  _____________________________________________________________________  External Attachment:    Type:   Image     Comment:   External Document  Appended Document: [facility] stable but close to needing dialysis

## 2010-12-05 NOTE — Letter (Signed)
Summary: [facility]   Imported By: Laural Benes 01/23/2010 09:18:08  _____________________________________________________________________  External Attachment:    Type:   Image     Comment:   External Document  Appended Document: [facility] going to "choices" class soon as she will likely need renal replacement therapy soon

## 2010-12-05 NOTE — Letter (Signed)
Summary: [facility]   Imported By: Edmonia James 08/25/2009 12:21:01  _____________________________________________________________________  External Attachment:    Type:   Image     Comment:   External Document

## 2010-12-05 NOTE — Consult Note (Signed)
Summary: [facility]   Imported By: Edmonia James 04/05/2010 10:04:55  _____________________________________________________________________  External Attachment:    Type:   Image     Comment:   External Document  Appended Document: [facility] approaching dialysis time has chosen peritoneal dialysis

## 2010-12-05 NOTE — Consult Note (Signed)
Summary: Laurie Legato, MD  Laurie Legato, MD   Imported By: Edmonia James 11/29/2009 13:53:57  _____________________________________________________________________  External Attachment:    Type:   Image     Comment:   External Document  Appended Document: Laurie Holley Raring, MD CKD stage IV slowly progressing has begun discussions about dialysis

## 2010-12-05 NOTE — Progress Notes (Signed)
Summary: pneumonia shot   Phone Note Call from Patient Call back at Home Phone [phone]   Caller: Patient Call For: Claris Gower MD Summary of Call: Patient is asking if she needs a pneumonia shot. If so she would like to come in for nurse visit. Please advise.  Initial call taken by: Lacretia Nicks,  September 08, 2010 9:11 AM  Follow-up for Phone Call        yes, she should get one It was an oversight that she didn't get one yet  okay as long as she got her flu shot over 1 month ago Follow-up by: Claris Gower MD,  September 08, 2010 9:44 AM  Additional Follow-up for Phone Call Additional follow up Details #1::        [facility] for patient to return my call.  Lacretia Nicks  September 08, 2010 9:47 AM  Left message with husband for patient to return my call.  Lacretia Nicks  September 11, 2010 3:37 PM      Additional Follow-up for Phone Call Additional follow up Details #2::    Patient called back and said that she will be receiving her pneumonia shot through [facility].  Follow-up by: Lacretia Nicks,  September 11, 2010 4:23 PM

## 2010-12-05 NOTE — Progress Notes (Signed)
Summary: blood sugar is elevated  Phone Note Call from Patient Call back at Home Phone [phone]   Caller: Patient Summary of Call: Pt is concerned about her blood sugar being up, runs between 200-300.  She is taking 10 mg of glipizide and 70 units of lantus daily.  She takes her lantus in the morning because she goes to dialysis at night, but she is asking if she should change that.  She wanted Dr. Silvio Pate to give her some advise about this but I told her he is out until monday. Initial call taken by: Marty Heck CMA, [place],  September 27, 2010 12:11 PM  Follow-up for Phone Call        Looks like her diabetes  has not been under great control for a while.  I would leave dose for now and discuss with Dr. Silvio Pate next week. Arnette Norris MD  September 27, 2010 12:46 PM  Advised pt. Follow-up by: Marty Heck CMA, AAMA,  September 27, 2010 12:53 PM  Additional Follow-up for Phone Call Additional follow up Details #1::        the dialysis may be affecting her diabetic control She probably needs more lantus It shouldn't matter when she takes it but occ it helps to split the dose  Have her try 40 units two times a day and see how that works for a couple of weeks. If no better, have her call back Claris Gower MD  September 29, 2010 8:38 AM   Spoke with patient and advised results.  Additional Follow-up by: Edwin Dada CMA Deborra Medina),  October 02, 2010 4:14 PM

## 2010-12-05 NOTE — Consult Note (Signed)
Summary: [facility]   Imported By: Edmonia James 03/08/2010 11:56:14  _____________________________________________________________________  External Attachment:    Type:   Image     Comment:   External Document  Appended Document: [facility] worsening nephropathy trying off lisinopril [facility] peritoneal dialysis ---when needed

## 2010-12-05 NOTE — Assessment & Plan Note (Signed)
Summary: 3 M F/U DLO   Vital Signs:  Patient profile:   70 year old female Weight:      218 pounds Temp:     97.7 degrees F oral Pulse rate:   64 / minute Pulse rhythm:   regular BP sitting:   140 / 70  (left arm) Cuff size:   large  Vitals Entered By: Edwin Dada CMA Deborra Medina) (June 21, 2010 10:39 AM) CC: 3 month follow-up   History of Present Illness: DOing fairly well Still planning peritoneal dialysis when she needs it Has upcoming appt to consider putting in the abd port  Changed lantus to AM this month Felt her AM sugars were dropping too much with evening dosing Doing much better--"better regulated" No recent hypoglycemic reactions but has been as low as 67 (without symptoms)  Has tried exericsing at [employer] Caused a lot of joint pain and weakness so she stopped Plans to try water aerobics discussed trying to walk   No chest pain No SOB No headaches  Allergies: No Known Drug Allergies  Past History:  Past medical, surgical, family and social histories (including risk factors) reviewed for relevance to current acute and chronic problems.  Past Medical History: Reviewed history from 06/20/2009 and no changes required. Colonic polyps, hx of Hyperlipidemia Hypertension Renal insufficiency----------------------------------------------Dr Lateef NIDDM with retinopathy and nephropathy--------------------Dr Brennan(eyes) Chronic anemia---------------------------------------------------Dr Gittin Glaucoma GERD Gout  Past Surgical History: Reviewed history from 05/21/2007 and no changes required. Cholecystectomy 1997 Hysterectomy 1983 Disc (Back) 1987 Miscarriage/  D & C Vaginal deliveries x 4 Adenosone Cardiolite normal EF 58% 10/07 6/08  Back surgery again ([place])  Family History: Reviewed history from 05/14/2007 and no changes required. Dad died @67   MI Mom with HTN, hyperlipidemia CAD strong in family  Social History: Reviewed history from  05/14/2007 and no changes required. Retired [employer] 2003 Divorced with 4 children Former Smoker--quit 1992 Alcohol use-no  Review of Systems       weight is up 4#--she feels she has cut back on eating May have some fluid in legs Had been depressed for a while about the prospect of dialysis---feels she is coming to terms with it  Physical Exam  General:  alert and normal appearance.   Neck:  supple, no masses, no thyromegaly, no carotid bruits, and no cervical lymphadenopathy.   Lungs:  normal respiratory effort, no intercostal retractions, no accessory muscle use, and normal breath sounds.   Heart:  normal rate, regular rhythm, no murmur, and no gallop.   Pulses:  1+ in feet Extremities:  trace edema in feet Skin:  no rashes and no suspicious lesions.   Psych:  normally interactive, good eye contact, not anxious appearing, and not depressed appearing.    Diabetes Management Exam:    Foot Exam (with socks and/or shoes not present):       Sensory-Pinprick/Light touch:          Left medial foot (L-4): normal          Left dorsal foot (L-5): normal          Left lateral foot (S-1): normal          Right medial foot (L-4): normal          Right dorsal foot (L-5): normal          Right lateral foot (S-1): normal       Inspection:          Left foot: normal  Right foot: normal       Nails:          Left foot: normal          Right foot: normal   Impression & Recommendations:  Problem # 1:  DIABETES MELLITUS, TYPE II (ICD-250.00) Assessment Comment Only  seems to be better goal is A1c <9% Important to avoid sig hypoglycemia--esp with renal failure  Her updated medication list for this problem includes:    Lantus 100 Unit/ml Soln (Insulin glargine) .Marland KitchenMarland KitchenMarland KitchenMarland Kitchen 70 units daily for diabetes    Glipizide Xl 10 Mg Tb24 (Glipizide) .Marland Kitchen... Take one by mouth daily    Diovan 320 Mg Tabs (Valsartan) .Marland Kitchen... Take 1 by mouth once daily  Orders: Venipuncture [id number]) TLB-A1C /  Hgb A1C (Glycohemoglobin) (83036-A1C)  Problem # 2:  HYPERTENSION (ICD-401.9) Assessment: Improved seems better in general she thinks the high numbers were due to stress as she was getting used to the idea of dialysis  Her updated medication list for this problem includes:    Terazosin Hcl 10 Mg Caps (Terazosin hcl) .Marland Kitchen... Take one by mouth daily    Clonidine Hcl 0.3 Mg Tabs (Clonidine hcl) .Marland Kitchen... Take one by mouth two times a day    Diovan 320 Mg Tabs (Valsartan) .Marland Kitchen... Take 1 by mouth once daily    Amlodipine Besylate 5 Mg Tabs (Amlodipine besylate) .Marland Kitchen... Take 1 by mouth once daily  BP today: 140/70 Prior BP: 156/80 (03/13/2010)  Labs Reviewed: K+: 4.5 (06/20/2009) Creat: : 2.3 (06/20/2009)   Chol: 200 (12/13/2009)   HDL: 57.60 (12/13/2009)   LDL: 110 (12/13/2009)   TG: 161.0 (12/13/2009)  Problem # 3:  GOUT (ICD-274.9) Assessment: Unchanged has been quiet eating better now also dose decreased by Dr Holley Raring  The following medications were removed from the medication list:    Allopurinol 300 Mg Tabs (Allopurinol) .Marland Kitchen... Take 1 by mouth once daily as needed Her updated medication list for this problem includes:    Allopurinol 100 Mg Tabs (Allopurinol) .Marland Kitchen... 1 tab daily to prevent gout  Problem # 4:  RENAL INSUFFICIENCY (ICD-588.9) Assessment: Comment Only planning peritoneal dialysis  Complete Medication List: 1)  Lantus 100 Unit/ml Soln (Insulin glargine) .... 70 units daily for diabetes 2)  Terazosin Hcl 10 Mg Caps (Terazosin hcl) .... Take one by mouth daily 3)  Clonidine Hcl 0.3 Mg Tabs (Clonidine hcl) .... Take one by mouth two times a day 4)  Simvastatin 80 Mg Tabs (Simvastatin) .... Take one by mouth daily 5)  Ferrous Sulfate 324 Mg Tbec (Ferrous sulfate) .... Take one by mouth daily 6)  Glipizide Xl 10 Mg Tb24 (Glipizide) .... Take one by mouth daily 7)  Norco 5-325 Mg Tabs (Hydrocodone-acetaminophen) .... Take 1 tablet every 2-4 hours as needed pain 8)  Diovan 320 Mg  Tabs (Valsartan) .... Take 1 by mouth once daily 9)  Amlodipine Besylate 5 Mg Tabs (Amlodipine besylate) .... Take 1 by mouth once daily 10)  Multivitamins Tabs (Multiple vitamin) .... Take one by mouth daily 11)  Freestyle Test Strp (Glucose blood) .... Use twice daily 12)  Freestyle Lancets Misc (Lancets) .... Test  two times a day or as directed 13)  Allopurinol 100 Mg Tabs (Allopurinol) .Marland Kitchen.. 1 tab daily to prevent gout  Other Orders: Radiology Referral (Radiology)  Patient Instructions: 1)  Please schedule a follow-up appointment in 4 months .  2)  Schedule your mammogram.   Current Allergies (reviewed today): No known allergies

## 2010-12-05 NOTE — Assessment & Plan Note (Signed)
Summary: 75 M F/U DLO   Vital Signs:  Patient profile:   70 year old female Weight:      221 pounds BMI:     34.74 Temp:     98 degrees F oral Pulse rate:   72 / minute Pulse rhythm:   regular BP sitting:   150 / 80  (left arm) Cuff size:   large  Vitals Entered By: Edwin Dada CMA Deborra Medina) (December 13, 2009 9:03 AM) CC: 6 month follow-up   History of Present Illness: Still having trouble with her feet Doesn't hurt like the gout mostly at night Occ affects her sleep  Having trouble with sugar--going up a lot trying to walk but hard with the cold weather [facility] Occ mild hypoglycemic reactions. Down to  ~60 twice---did feel it slightly has noted in early morning and at night  Notes some palpitations at times mostly in bed at night--can hear it Not really too fast---"kinda" No SOB  Gout fairly quiet but occ flares can't afford the colchicine now---only colcrys now   Allergies: No Known Drug Allergies  Past History:  Past medical, surgical, family and social histories (including risk factors) reviewed for relevance to current acute and chronic problems.  Past Medical History: Reviewed history from 06/20/2009 and no changes required. Colonic polyps, hx of Hyperlipidemia Hypertension Renal insufficiency----------------------------------------------Dr Lateef NIDDM with retinopathy and nephropathy--------------------Dr Brennan(eyes) Chronic anemia---------------------------------------------------Dr Gittin Glaucoma GERD Gout  Past Surgical History: Reviewed history from 05/21/2007 and no changes required. Cholecystectomy 1997 Hysterectomy 1983 Disc (Back) 1987 Miscarriage/  D & C Vaginal deliveries x 4 Adenosone Cardiolite normal EF 58% 10/07 6/08  Back surgery again ([place])  Family History: Reviewed history from 05/14/2007 and no changes required. Dad died @67   MI Mom with HTN, hyperlipidemia CAD strong in family  Social  History: Reviewed history from 05/14/2007 and no changes required. Retired [employer] 2003 Divorced with 4 children Former Smoker--quit 1992 Alcohol use-no  Review of Systems       The patient complains of dyspnea on exertion.  The patient denies syncope.         Has gained 15# since last visit!!! some swelling in ankles Mild change in exercise tolerance  Physical Exam  General:  alert and normal appearance.   Neck:  supple, no masses, no thyromegaly, no carotid bruits, and no cervical lymphadenopathy.   Lungs:  normal respiratory effort and normal breath sounds.   Heart:  normal rate, regular rhythm, no murmur, and no gallop.   Abdomen:  soft and non-tender.   Pulses:  1+ in feet Extremities:  no edema Neurologic:  alert & oriented X3, strength normal in all extremities, and gait normal.   Skin:  no suspicious lesions and no ulcerations.   Psych:  normally interactive, good eye contact, not anxious appearing, and not depressed appearing.    Diabetes Management Exam:    Foot Exam (with socks and/or shoes not present):       Sensory-Pinprick/Light touch:          Left medial foot (L-4): diminished          Left dorsal foot (L-5): diminished          Left lateral foot (S-1): diminished          Right medial foot (L-4): diminished          Right dorsal foot (L-5): diminished          Right lateral foot (S-1): diminished  Inspection:          Left foot: normal          Right foot: normal       Nails:          Left foot: normal          Right foot: normal    Eye Exam:       Eye Exam done elsewhere          Date: 07/06/2009          Results: stable retinopathy          Done by: Dr Tobe Sos   Impression & Recommendations:  Problem # 1:  DIABETES MELLITUS, TYPE II, UNCONTROLLED (ICD-250.02) Assessment New  clearly not eating right has gained weight discussed alternatives P: [facility] may want to adjust insulin dosing but mainly needs  counselling about lifestyle  The following medications were removed from the medication list:    Diovan Hct 320-25 Mg Tabs (Valsartan-hydrochlorothiazide) .Marland Kitchen... Take 1 tablet by mouth once a day Her updated medication list for this problem includes:    Lisinopril 40 Mg Tabs (Lisinopril) .Marland Kitchen... Take one by mouth daily    Humulin N 100 Unit/ml Susp (Insulin isophane human) ..... Inject 35 units in the am and 20 units in the pm    Glipizide Xl 10 Mg Tb24 (Glipizide) .Marland Kitchen... Take one by mouth daily    Diovan 320 Mg Tabs (Valsartan) .Marland Kitchen... Take 1 by mouth once daily  Orders: TLB-A1C / Hgb A1C (Glycohemoglobin) (83036-A1C) Diabetic Clinic Referral (Diabetic)  Problem # 2:  PALPITATIONS (ICD-785.1) Assessment: New  doesn't really sound pathologic  Orders: EKG w/ Interpretation (93000)  Problem # 3:  NEUROPATHY (ICD-355.9) Assessment: Deteriorated  will check B12 start gabapentin at night to see if this helps  Orders: TLB-B12, Serum-Total ONLY [id number]) Venipuncture HR:[id number])  Problem # 4:  RENAL INSUFFICIENCY (ICD-588.9) Assessment: Comment Only Dr Holley Raring managing  Problem # 5:  HYPERTENSION (ICD-401.9) Assessment: Unchanged  fair control no changes now will work on lifestyle  The following medications were removed from the medication list:    Diovan Hct 320-25 Mg Tabs (Valsartan-hydrochlorothiazide) .Marland Kitchen... Take 1 tablet by mouth once a day Her updated medication list for this problem includes:    Terazosin Hcl 10 Mg Caps (Terazosin hcl) .Marland Kitchen... Take one by mouth daily    Lisinopril 40 Mg Tabs (Lisinopril) .Marland Kitchen... Take one by mouth daily    Clonidine Hcl 0.3 Mg Tabs (Clonidine hcl) .Marland Kitchen... Take one by mouth two times a day    Diovan 320 Mg Tabs (Valsartan) .Marland Kitchen... Take 1 by mouth once daily  BP today: 150/80 Prior BP: 150/80 (06/20/2009)  Labs Reviewed: K+: 4.5 (06/20/2009) Creat: : 2.3 (06/20/2009)   Chol: 135 (10/25/2008)   HDL: 40.1 (10/25/2008)   LDL: 70 (10/25/2008)   TG: 126  (10/25/2008)  Orders: TLB-CBC Platelet - w/Differential (85025-CBCD) TLB-TSH (Thyroid Stimulating Hormone) (84443-TSH)  Complete Medication List: 1)  Terazosin Hcl 10 Mg Caps (Terazosin hcl) .... Take one by mouth daily 2)  Lisinopril 40 Mg Tabs (Lisinopril) .... Take one by mouth daily 3)  Clonidine Hcl 0.3 Mg Tabs (Clonidine hcl) .... Take one by mouth two times a day 4)  Humulin N 100 Unit/ml Susp (Insulin isophane human) .... Inject 35 units in the am and 20 units in the pm 5)  Simvastatin 80 Mg Tabs (Simvastatin) .... Take one by mouth daily 6)  Ferrous Sulfate 324 Mg Tbec (Ferrous sulfate) .Marland KitchenMarland KitchenMarland Kitchen  Take one by mouth daily 7)  Multivitamins Tabs (Multiple vitamin) .... Take one by mouth daily 8)  Glipizide Xl 10 Mg Tb24 (Glipizide) .... Take one by mouth daily 9)  Bd Syringes 0.3cc  10)  Freestyle Test Strp (Glucose blood) .... Use twice daily 11)  Freestyle Lancets Misc (Lancets) .... Test  two times a day or as directed 12)  Colchicine 0.6 Mg Tabs (Colchicine) .Marland Kitchen.. 1 two times a day for gout 13)  Norco 5-325 Mg Tabs (Hydrocodone-acetaminophen) .... Take 1 tablet every 2-4 hours as needed pain 14)  Diovan 320 Mg Tabs (Valsartan) .... Take 1 by mouth once daily 15)  Gabapentin 300 Mg Caps (Gabapentin) .Marland Kitchen.. 1-2 tabs at bedtime for diabetic nerve pain in feet  Other Orders: TLB-Lipid Panel (80061-LIPID) TLB-Hepatic/Liver Function Pnl (80076-HEPATIC)  Patient Instructions: 1)  Please schedule a follow-up appointment in 3 months .  2)  Referral Appointment Information 3)  Day/Date: 4)  Time: 5)  Place/MD: 6)  Address: 7)  Phone/Fax: 8)  Patient given appointment information. Information/Orders faxed/mailed. Prescriptions: GABAPENTIN 300 MG CAPS (GABAPENTIN) 1-2 tabs at bedtime for diabetic nerve pain in feet  #60 x 11   Entered and Authorized by:   Claris Gower MD   Signed by:   Claris Gower MD on 12/13/2009   Method used:   Electronically to        [facility].* (retail)       [address]       [place], [place]  [address]       Ph: [phone]       Fax: [phone]   RxID:   [id number]   Current Allergies (reviewed today): No known allergies    EKG  Procedure date:  12/13/2009  Findings:      sinus @60  Normal

## 2010-12-05 NOTE — Letter (Signed)
Summary: [place] / [place] / [place] By: Laural Benes 02/13/2010 14:25:00  _____________________________________________________________________  External Attachment:    Type:   Image     Comment:   External Document  Appended Document: [facility] [place] done with full lifestyle counselling

## 2010-12-05 NOTE — Letter (Signed)
Summary: [facility]   Imported By: Laural Benes 08/04/2010 14:26:15  _____________________________________________________________________  External Attachment:    Type:   Image     Comment:   External Document  Appended Document: [facility] planning placement of peritoneal dialysis catheter and starting dialysis 3 weeks later

## 2010-12-05 NOTE — Letter (Signed)
Summary: [facility]  [facility]   Imported By: Bubba Hales 09/27/2010 12:44:37  _____________________________________________________________________  External Attachment:    Type:   Image     Comment:   External Document  Appended Document: [facility] post op peritoneal catheter

## 2010-12-05 NOTE — Progress Notes (Signed)
Summary: blood sugar low  Phone Note Call from Patient Call back at Home Phone [phone]   Caller: Patient Call For: Claris Gower MD Summary of Call: Pt started lantus yesterday around 5 pm and this morning her fasting blood sugar was 61.  She is getting 70 units of lantus and she is asking if she should continue with that dose, should she take this later in the evening?  Please advise on what she should do. Initial call taken by: Marty Heck CMA,  Mar 17, 2010 11:06 AM  Follow-up for Phone Call        have her change to 60 units daily May be too much at 15 If she has more low sugar reactions over the weekend, have her go down to 50 units and call to let us know on Monday Follow-up by: Claris Gower MD,  Mar 17, 2010 1:46 PM  Additional Follow-up for Phone Call Additional follow up Details #1::        left message on machine at home for patient to return my call. tried to leave message, machine just beeps. DeShannon Smith CMA Deborra Medina)  Mar 17, 2010 3:31 PM   Advised pt.                Marty Heck CMA  Mar 17, 2010 4:06 PM

## 2010-12-05 NOTE — Progress Notes (Signed)
Summary: refill request for vicodin  Phone Note Refill Request Message from:  Fax from Pharmacy  Refills Requested: Medication #1:  vicodin 5-325   Last Refilled: 01/24/2009 Faxed refill request from [facility], this is no longer on pt's med list.  [phone]  Initial call taken by: Marty Heck CMA,  December 09, 2009 4:11 PM  Follow-up for Phone Call        need to clarify with pt since no longer on med list  Follow-up by: Allena Earing MD,  December 09, 2009 4:52 PM  Additional Follow-up for Phone Call Additional follow up Details #1::        Pt's phone number is disconnected.             Marty Heck CMA  December 09, 2009 5:08 PM  Okay to refill has been given this in past but taken off list at last visit Okay #60 x 0  Additional Follow-up by: Claris Gower MD,  December 12, 2009 1:31 PM    Additional Follow-up for Phone Call Additional follow up Details #2::    rx called into pharmacy Follow-up by: Edwin Dada CMA Deborra Medina),  December 12, 2009 3:57 PM  New/Updated Medications: NORCO 5-325 MG TABS (HYDROCODONE-ACETAMINOPHEN) take 1 tablet every 2-4 hours as needed pain Prescriptions: NORCO 5-325 MG TABS (HYDROCODONE-ACETAMINOPHEN) take 1 tablet every 2-4 hours as needed pain  #60 x 0   Entered by:   Edwin Dada CMA ([place])   Authorized by:   Claris Gower MD   Signed by:   Edwin Dada CMA ([place]) on 12/12/2009   Method used:   Telephoned to ...       [facility] (retail)       [address]       [place], [place]  [address]       Ph: [phone]       Fax: [phone]   RxID:   [id number]

## 2010-12-05 NOTE — Progress Notes (Signed)
Summary: bp  Phone Note Call from Patient Call back at Home Phone [phone]   Caller: Patient Call For: Claris Gower MD Summary of Call: Patient was seen by her kidney dr. this morning and her bp was up. It was 206/93. Patient has an appt. with you on the 9th. I let patient know to call her kidney dr. to see if they wanted to change any of her medications and to keep her appt. with you on the 9th. She said that she would call kidney dr. Initial call taken by: Lacretia Nicks,  Mar 08, 2010 2:07 PM  Follow-up for Phone Call        Please call Dr Holley Raring to make sure we are treating her correctly. Does he want her back on lisinopril? Are there any other changes he recommends before I can see her next week? Richard Horton Marshall MD  Mar 08, 2010 2:27 PM   calling pt to see if she spoke with Dr. Holley Raring, left message on machine at home for patient to return my call.  DeShannon Smith CMA Deborra Medina)  Mar 08, 2010 4:04 PM   patient has appt with Dr. Silvio Pate, 03/13/2010 @ 10:00pm, pt never called back, I left several messages, answering machine was a little different, just started beeping. DeShannon Smith CMA Deborra Medina),  Mar 10, 2010 11:13 AM  patient called back, pt states she got upset when Dr. Holley Raring told her she will have to go on dialysis, she thinks that's why her bp was so high. Pt also states her pinky finger is swollen, she thinks it's gout, she was given Allopurinal by Dr. Paulla Dolly and pt states it doesn't work, but pt stated she doesn't take it everyday either. I advised pt to take everyday until she see Dr. Silvio Pate on Monday. DeShannon Tamala Julian CMA Deborra Medina)  Mar 10, 2010 11:30 AM   Additional Follow-up for Phone Call Additional follow up Details #1::        I asked YOU to call Dr Holley Raring or get his last note--not have her call him!!!!!  I will review her BP at upcoming appt but need the note from Dr Holley Raring!! Richard Horton Marshall MD  Mar 11, 2010 1:00 PM   last office note is in chart, pt was last  seen 03/01/2010 , pt never saw Dr. Holley Raring on 03/08/2010 she just went in for BP check, his nurse is faxing over BP check. I never spoke to pt until Friday, I was going by phone note that Caryl Pina asked pt to call Dr. Holley Raring. DeShannon Smith CMA Deborra Medina)  Mar 13, 2010 8:54 AM   See note BP 206/93 Better today in office here Additional Follow-up by: Claris Gower MD,  Mar 13, 2010 10:36 AM

## 2010-12-07 NOTE — Assessment & Plan Note (Signed)
Summary: 4MTH FOLLOW UP / LFW   Vital Signs:  Patient profile:   70 year old female Weight:      222 pounds BMI:     34.90 Temp:     97.7 degrees F oral Pulse rate:   64 / minute Pulse rhythm:   regular BP sitting:   112 / 62  (left arm) Cuff size:   large  Vitals Entered By: Edwin Dada CMA Deborra Medina) (October 23, 2010 10:07 AM) CC: 4 month follow-up   History of Present Illness: DOing okay  Still takes getting used to the peritoneal dialysis does it overnight "feels like I'm pregnant" May effect her sleep at times No abd pain or infections  Did cut the statin dose No problems with this  Diabetes control seems to be improving Better in the past few weeks--only over 200 twice Mostly in mid 100's trying to increase walking---knees not hurting as much No hypoglycemia --there is some glucose in the dialysate  No chest pain No SOB  Allergies: No Known Drug Allergies  Past History:  Past medical, surgical, family and social histories (including risk factors) reviewed for relevance to current acute and chronic problems.  Past Medical History: Reviewed history from 06/20/2009 and no changes required. Colonic polyps, hx of Hyperlipidemia Hypertension Renal insufficiency----------------------------------------------Dr Lateef NIDDM with retinopathy and nephropathy--------------------Dr Brennan(eyes) Chronic anemia---------------------------------------------------Dr Gittin Glaucoma GERD Gout  Past Surgical History: Reviewed history from 05/21/2007 and no changes required. Cholecystectomy 1997 Hysterectomy 1983 Disc (Back) 1987 Miscarriage/  D & C Vaginal deliveries x 4 Adenosone Cardiolite normal EF 58% 10/07 6/08  Back surgery again ([place])  Family History: Reviewed history from 05/14/2007 and no changes required. Dad died @67   MI Mom with HTN, hyperlipidemia CAD strong in family  Social History: Reviewed history from 05/14/2007 and no changes  required. Retired [employer] 2003 Divorced with 4 children Former Smoker--quit 1992 Alcohol use-no  Review of Systems       appetite is fair weight is up 4# since last visit sleeps is okay despite the dialysis  Physical Exam  General:  alert and normal appearance.   Neck:  supple, no masses, no thyromegaly, no carotid bruits, and no cervical lymphadenopathy.   Lungs:  normal respiratory effort, no intercostal retractions, no accessory muscle use, and normal breath sounds.   Heart:  normal rate, regular rhythm, no murmur, and no gallop.   Pulses:  faint in feet Extremities:  no sig edema Skin:  no suspicious lesions and no ulcerations.   Psych:  normally interactive, good eye contact, not anxious appearing, and not depressed appearing.    Diabetes Management Exam:    Foot Exam (with socks and/or shoes not present):       Sensory-Pinprick/Light touch:          Left medial foot (L-4): normal          Left dorsal foot (L-5): normal          Left lateral foot (S-1): normal          Right medial foot (L-4): normal          Right dorsal foot (L-5): normal          Right lateral foot (S-1): normal       Inspection:          Left foot: normal          Right foot: normal       Nails:  Left foot: normal          Right foot: normal    Eye Exam:       Eye Exam done elsewhere          Date: 01/24/202011          Results: no retinopathy          Done by: Dr Tobe Sos   Impression & Recommendations:  Problem # 1:  DIABETES MELLITUS, TYPE II (ICD-250.00) Assessment Comment Only  will accept A1c up to about 9% given past hypoglycemia and renal failure consider increasing lantus as needed  The following medications were removed from the medication list:    Diovan 320 Mg Tabs (Valsartan) .Marland Kitchen... Take 1 by mouth once daily Her updated medication list for this problem includes:    Lantus 100 Unit/ml Soln (Insulin glargine) .Marland KitchenMarland KitchenMarland KitchenMarland Kitchen 70 units daily for diabetes    Glipizide Xl 10  Mg Tb24 (Glipizide) .Marland Kitchen... Take one by mouth daily  Labs Reviewed: Creat: 2.3 (06/20/2009)     Last Eye Exam: no retinopathy (01/24/202011) Reviewed HgBA1c results: 8.9 (06/21/2010)  8.5 (03/13/2010)  Orders: TLB-A1C / Hgb A1C (Glycohemoglobin) (83036-A1C) TLB-T4 (Thyrox), Free [phone])  Problem # 2:  HYPERTENSION (ICD-401.9) Assessment: Unchanged good control no changes needed  The following medications were removed from the medication list:    Diovan 320 Mg Tabs (Valsartan) .Marland Kitchen... Take 1 by mouth once daily Her updated medication list for this problem includes:    Amlodipine Besylate 5 Mg Tabs (Amlodipine besylate) .Marland Kitchen... Take 1 by mouth once daily    Terazosin Hcl 10 Mg Caps (Terazosin hcl) .Marland Kitchen... Take one by mouth daily    Clonidine Hcl 0.3 Mg Tabs (Clonidine hcl) .Marland Kitchen... Take one by mouth two times a day    Hydralazine Hcl 50 Mg Tabs (Hydralazine hcl) .Marland Kitchen... Take 1 by mouth three times a day  BP today: 112/62 Prior BP: 140/70 (06/21/2010)  Labs Reviewed: K+: 4.5 (06/20/2009) Creat: : 2.3 (06/20/2009)   Chol: 196 (11/09/2018/04/2410)   HDL: 41.90 (12/04/2018/04/2410)   LDL: 116 (11/19/2018/04/2410)   TG: 191.0 (11/18/2018/04/2410)  Problem # 3:  HYPERLIPIDEMIA (ICD-272.4) Assessment: Unchanged  no side effects will recheck LFTs though  Her updated medication list for this problem includes:    Pravastatin Sodium 80 Mg Tabs (Pravastatin sodium) .Marland Kitchen... Take 1/2  by mouth once daily  Labs Reviewed: SGOT: 43 (11/05/2018/04/2410)   SGPT: 45 (11/23/2018/04/2410)   HDL:41.90 (11/23/2018/04/2410), 57.60 (12/13/2009)  LDL:116 (11/25/2018/04/2410), 110 (12/13/2009)  Chol:196 (11/08/2018/04/2410), 200 (12/13/2009)  Trig:191.0 (11/07/2018/04/2410), 161.0 (12/13/2009)  Orders: TLB-Hepatic/Liver Function Pnl (80076-HEPATIC) Venipuncture [id number])  Problem # 4:  END STAGE RENAL DISEASE (ICD-585.6) Assessment: Comment Only Dr Holley Raring managing seems to be doing okay with the peritoneal dialysis  Complete Medication List: 1)  Amlodipine Besylate 5 Mg Tabs  (Amlodipine besylate) .... Take 1 by mouth once daily 2)  Allopurinol 100 Mg Tabs (Allopurinol) .Marland Kitchen.. 1 tab daily to prevent gout 3)  Pravastatin Sodium 80 Mg Tabs (Pravastatin sodium) .... Take 1/2  by mouth once daily 4)  Lantus 100 Unit/ml Soln (Insulin glargine) .... 70 units daily for diabetes 5)  Terazosin Hcl 10 Mg Caps (Terazosin hcl) .... Take one by mouth daily 6)  Clonidine Hcl 0.3 Mg Tabs (Clonidine hcl) .... Take one by mouth two times a day 7)  Glipizide Xl 10 Mg Tb24 (Glipizide) .... Take one by mouth daily 8)  Freestyle Test Strp (Glucose blood) .... Use twice daily 9)  Freestyle Lancets Misc (Lancets) .... Test  two times a day or as directed 10)  Hydralazine Hcl 50 Mg Tabs (Hydralazine hcl) .... Take 1 by mouth three times a day 11)  Calcitriol 0.5 Mcg Caps (Calcitriol) .... Take 1 by mouth once daily  Patient Instructions: 1)  Please schedule a follow-up appointment in 4 months .    Orders Added: 1)  Est. Patient Level IV [id number] 2)  TLB-Hepatic/Liver Function Pnl [80076-HEPATIC] 3)  Venipuncture [36415] 4)  TLB-A1C / Hgb A1C (Glycohemoglobin) [83036-A1C] 5)  TLB-T4 (Thyrox), Free [id number]   Immunization History:  Tetanus/Td Immunization History:    Tetanus/Td:  Td (05/22/2004)  Influenza Immunization History:    Influenza:  Historical (08/16/2010)  Pneumovax Immunization History:    Pneumovax:  Historical (08/25/2010)   Immunization History:  Pneumovax Immunization History:    Pneumovax:  Historical (08/25/2010)  Current Allergies (reviewed today): No known allergies

## 2010-12-07 NOTE — Miscellaneous (Signed)
Summary: med list update- syringes  Clinical Lists Changes  Medications: Added new medication of BD INSULIN SYR ULTRAFINE II 31G X 5/16" 1 ML MISC (INSULIN SYRINGE-NEEDLE U-100) use as directed     Prior Medications: AMLODIPINE BESYLATE 5 MG TABS (AMLODIPINE BESYLATE) take 1 by mouth once daily ALLOPURINOL 100 MG TABS (ALLOPURINOL) 1 tab daily to prevent gout PRAVASTATIN SODIUM 80 MG TABS (PRAVASTATIN SODIUM) Take 1/2  by mouth once daily LANTUS 100 UNIT/ML SOLN (INSULIN GLARGINE) 40 units two times a day TERAZOSIN HCL 10 MG CAPS (TERAZOSIN HCL) Take one by mouth daily CLONIDINE HCL 0.3 MG  TABS (CLONIDINE HCL) Take one by mouth two times a day GLIPIZIDE XL 10 MG  TB24 (GLIPIZIDE) Take one by mouth daily FREESTYLE TEST  STRP (GLUCOSE BLOOD) Use twice daily FREESTYLE LANCETS  MISC (LANCETS) test  two times a day or as directed HYDRALAZINE HCL 50 MG TABS (HYDRALAZINE HCL) take 1 by mouth three times a day CALCITRIOL 0.5 MCG CAPS (CALCITRIOL) take 1 by mouth once daily BD INSULIN SYR ULTRAFINE II 31G X 5/16" 1 ML MISC (INSULIN SYRINGE-NEEDLE U-100) use as directed Current Allergies: No known allergies

## 2010-12-08 NOTE — Consult Note (Signed)
Summary: [facility]   Imported By: Edmonia James 12/26/2009 13:31:10  _____________________________________________________________________  External Attachment:    Type:   Image     Comment:   External Document  Appended Document: [facility] done follow up planned

## 2011-01-06 ENCOUNTER — Encounter: Payer: Self-pay | Admitting: Internal Medicine

## 2011-01-16 ENCOUNTER — Encounter: Payer: Self-pay | Admitting: Internal Medicine

## 2011-01-17 ENCOUNTER — Telehealth: Payer: Self-pay | Admitting: Internal Medicine

## 2011-01-19 ENCOUNTER — Emergency Department: Payer: Self-pay | Admitting: Unknown Physician Specialty

## 2011-01-23 NOTE — Progress Notes (Signed)
Summary: form for diabetic supplies  Phone Note From Pharmacy   Caller: [facility] [place] Summary of Call: Form for diabetic supplies is on your desk, I spoke with the patient and she does want these supplies. Initial call taken by: Marty Heck CMA, AAMA,  January 17, 2011 10:38 AM  Follow-up for Phone Call        this was done yesterday by Dr.Pavan Bring, form was signed and sent back, also will be scanned. Follow-up by: Edwin Dada CMA Deborra Medina),  January 17, 2011 11:15 AM

## 2011-02-01 NOTE — Medication Information (Signed)
Summary: [facility]   Imported By: Jamelle Haring 01/22/2011 11:33:43  _____________________________________________________________________  External Attachment:    Type:   Image     Comment:   External Document

## 2011-02-06 ENCOUNTER — Telehealth: Payer: Self-pay | Admitting: *Deleted

## 2011-02-06 NOTE — Telephone Encounter (Signed)
Patient calling asking what she can do about her blood sugars being so high, pt states they have been over 200, this morning at 3am blood sugar was 301, and again this morning it was 222. Patient states that her dialysis medication has some sort of sugar in it, per the nurse. Please advise

## 2011-02-06 NOTE — Telephone Encounter (Signed)
Yes it does and when she has the dwell in --it can increase her sugars Since her diabetes control has not been overall optimal--I would have her increase her PM lantus to 50 (make sure she was taking 40 now) If her AM sugars remain over 180, have her call in the next couple of weeks and we can decide about further increases

## 2011-02-07 NOTE — Telephone Encounter (Signed)
Spoke with patient and advised results, she will call if anymore changes.

## 2011-02-20 ENCOUNTER — Ambulatory Visit (INDEPENDENT_AMBULATORY_CARE_PROVIDER_SITE_OTHER): Payer: Medicare Other | Admitting: Internal Medicine

## 2011-02-20 ENCOUNTER — Encounter: Payer: Self-pay | Admitting: Internal Medicine

## 2011-02-20 VITALS — BP 138/60 | HR 64 | Temp 97.8°F | Ht 67.0 in | Wt 229.0 lb

## 2011-02-20 DIAGNOSIS — E119 Type 2 diabetes mellitus without complications: Secondary | ICD-10-CM

## 2011-02-20 DIAGNOSIS — I10 Essential (primary) hypertension: Secondary | ICD-10-CM

## 2011-02-20 DIAGNOSIS — N186 End stage renal disease: Secondary | ICD-10-CM

## 2011-02-20 DIAGNOSIS — E785 Hyperlipidemia, unspecified: Secondary | ICD-10-CM

## 2011-02-20 NOTE — Progress Notes (Signed)
Subjective:    Patient ID: Laurie Steele, female    DOB: Jun 02, 1941, 70 y.o.   MRN: [id number]  HPI Has increased the lantus to 40/50 Averaging about 179 Checks bid usually Fasting sugars 120-130 No hypoglycemic reactions Tries to be careful with eating  Still on peritoneal dialysis Leaves her dwells in 11PM -7AM Seems to be doing well on this Still doesn't like the feeling-- "like being pregnant"  BP has been good She checks daily No chest pain No SOB Occ edema---as the day goes on. Generally gone in AM  Current outpatient prescriptions:allopurinol (ZYLOPRIM) 100 MG tablet, Take 100 mg by mouth daily. To prevent gout , Disp: , Rfl: ;  amLODipine (NORVASC) 5 MG tablet, Take 5 mg by mouth daily.  , Disp: , Rfl: ;  calcitRIOL (ROCALTROL) 0.5 MCG capsule, Take 0.5 mcg by mouth daily.  , Disp: , Rfl: ;  cinacalcet (SENSIPAR) 30 MG tablet, Take 30 mg by mouth daily.  , Disp: , Rfl:  cloNIDine (CATAPRES) 0.3 MG tablet, Take 0.3 mg by mouth 2 (two) times daily.  , Disp: , Rfl: ;  glipiZIDE (GLUCOTROL) 10 MG 24 hr tablet, Take 10 mg by mouth daily.  , Disp: , Rfl: ;  glucose blood (FREESTYLE TEST STRIPS) test strip, 1 each by Other route as needed. Use as instructed , Disp: , Rfl: ;  hydrALAZINE (APRESOLINE) 50 MG tablet, Take 50 mg by mouth 3 (three) times daily.  , Disp: , Rfl:  insulin glargine (LANTUS) 100 UNIT/ML injection, Inject 40 Units into the skin 2 (two) times daily.  , Disp: , Rfl: ;  Insulin Syringe-Needle U-100 (B-D INS SYRINGE 0.5CC/31GX5/16) 31G X 5/16" 0.5 ML MISC, by Does not apply route as directed.  , Disp: , Rfl: ;  Lancets (FREESTYLE) lancets, 1 each by Other route as needed. Use as instructed , Disp: , Rfl: ;  losartan (COZAAR) 50 MG tablet, Take 50 mg by mouth daily.  , Disp: , Rfl:  pravastatin (PRAVACHOL) 80 MG tablet, Take 80 mg by mouth. 1/2 by mouth once a day , Disp: , Rfl: ;  sevelamer (RENVELA) 800 MG tablet, Take 800 mg by mouth 3 (three) times daily with meals.   , Disp: , Rfl: ;  terazosin (HYTRIN) 10 MG capsule, Take 10 mg by mouth daily.  , Disp: , Rfl:   Past Medical History  Diagnosis Date  . Hx of colonic polyps   . Hyperlipidemia   . Hypertension   . Renal failure     Dr Holley Raring  . 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 Chronic anemia     Dr Inez Pilgrim  . Glaucoma   . GERD (gastroesophageal reflux disease)   . Gout   . Miscarriage   . Vaginal delivery     x 4    Past Surgical History  Procedure Date  . Cholecystectomy   . Abdominal hysterectomy 1983  . Back surgery 6/08    Dr Collier Salina  . Back surgery 1987    disc    Family History  Problem Relation Age of Onset  . Heart attack Father   . Hypertension Mother   . Hyperlipidemia Mother   . Coronary artery disease      strong fam hx    History   Social History  .  Marital Status: Divorced    Spouse Name: N/A    Number of Children: 67  . Years of Education: N/A   Occupational History  . retired     [employer] 2003   Social History Main Topics  . Smoking status: Former Smoker    Quit date: 11/05/1990  . Smokeless tobacco: Not on file  . Alcohol Use: No  . Drug Use: Not on file  . Sexually Active: Not on file   Other Topics Concern  . Not on file   Social History Narrative  . No narrative on file      Review of Systems No appetite in the morning--generally does okay at other times Weight is up a few pounds--not eating a lot though Sleeps okay most of the time--if her stomach is not too uncomfortable     Objective:   Physical Exam  Constitutional: She appears well-developed and well-nourished. No distress.  Neck: Normal range of motion. No thyromegaly present.  Cardiovascular: Normal rate, regular rhythm and normal heart sounds.  Exam reveals no gallop.   No murmur heard.      Faint pulses in  each foot  Pulmonary/Chest: Effort normal and breath sounds normal. No respiratory distress. She has no wheezes. She has no rales.  Abdominal: Soft. There is no tenderness.  Musculoskeletal: She exhibits no edema and no tenderness.  Lymphadenopathy:    She has no cervical adenopathy.  Skin: No rash noted.       No foot ulcers  Psychiatric: She has a normal mood and affect. Her behavior is normal. Judgment and thought content normal.          Assessment & Plan:

## 2011-02-26 ENCOUNTER — Other Ambulatory Visit: Payer: Self-pay | Admitting: *Deleted

## 2011-02-26 MED ORDER — FREESTYLE LANCETS MISC: Status: DC

## 2011-02-26 MED ORDER — GLUCOSE BLOOD VI STRP: ORAL_STRIP | Status: DC

## 2011-03-20 NOTE — Op Note (Signed)
NAMENOELIA, Laurie Steele              ACCOUNT NO.:  [id number]   MEDICAL RECORD NO.:  [id number]          PATIENT TYPE:  AMB   LOCATION:  DAY                          FACILITY[facility]  [facility]   PHYSICIAN:  Tarri Glenn, M.D.  DATE OF BIRTH:  04-17-1941   DATE OF PROCEDURE:  04/17/2007  DATE OF DISCHARGE:                               OPERATIVE REPORT   PREOPERATIVE DIAGNOSIS:  1. 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  2. 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  3. Central and foraminal stenosis L3-4, L4-5, L5-S1.   POSTOPERATIVE DIAGNOSIS:  1. 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  2. 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  3. Central and foraminal stenosis L3-4, L4-5, L5-S1.   OPERATION:  Central and foraminal decompression L3-4, L4-5, L5-S1.   SURGEON:  Tarri Glenn, M.D.   ASSISTANT:  Kipp Brood. Gladstone Lighter, M.D.   ANESTHESIA:  General.   INDICATIONS FOR PROCEDURE:  She had had a laminectomy at L5-S1 and she  thinks on the left side back in early 1990s by now retired Publishing rights manager  in town. She is having bilateral buttock and leg pain left greater than  right.  She is a diabetic and has very narrow L5-S1 disk space and  degenerative spondylolisthesis of L4-L5 which on myelogram  flexion/extension views demonstrate only 1 mm of shifting.  Post  myelogram CT scan suggested that there was a disk herniation at the L5-  S1 left.  See operative description below for additional details of  surgery and findings.   PROCEDURE:  Prophylactic antibiotics, satisfactory general anesthesia,  knee-chest position on the [place] frame, time-out performed. Back was  prepped with DuraPrep draped in sterile field. Ioban employed.  Initial  entry at the old surgical incision. Myelogram was unclear as to how much  decompression she had had at 5-1, could have been central.  We  first to  identified what I felt was the spinous process of L4, tagged with a  Kocher clamp.  The lateral x-ray confirmed that this was L4. Based on  this I then began dissecting soft tissue off the neural arches from L4  to the sacrum. At L5-S1 we took great care bilaterally but did find  fairly intact L5 laminae and sacrum.  Working first cephalad I removed  the spinous process of L4 as well as the neural arch working superiorly  and continuing to decompress as long as there was spinal stenosis. This  involved our decompressing her all way up to the neural arch of L3  confirmed with a second lateral x-ray with markers.  We did some minor  lateral recess decompression as well and then began working distally  down to the sacrum. Then brought in the microscope and we did the finer  points of decompression bilaterally to clear L4-5 which seemed to be  most major portion of her compression. She had only 1 mm of slippage of  flexion/extension lateral x-rays so we did not feel that any fusion was  indicated.  L5-S1 on the right side we had no problems at all  decompressing the dura and performing wide foraminotomy for the S1 nerve  root. On the left side the foraminotomy was easily performed.  However  just cephalad to this, the dura was tightly adherent to the lateral  recess bone and we felt there was too much risk for a dural tear by  trying to work a lateral ward to look at the disk.  Because the dura and  the S1 nerve root and the foramen were so well decompressed.  We felt  that this was not necessary.  At the conclusion three level  decompression I irrigated the wound well, placed Gelfoam soaked in  thrombin over the dura, removed the self-retaining McCullough  retractors, placed a quarter inch Penrose drain with safety pin through  the right lower lumbar area, and under direct visualization closed the  wound around it with interrupted #1 Vicryl in the fascia and deep  subcutaneous  tissue, 2-0 Vicryl superficial subcutaneous tissue, staples  in the skin. Betadine Adaptic dry sterile dressing were applied.  She  was gently placed on the PACU bed and taken to recovery room in  satisfactory condition with no known complications.  Estimated blood  loss was 100 mL, no blood replacement.           ______________________________  Tarri Glenn, M.D.     JA/MEDQ  D:  04/17/2007  T:  04/17/2007  Job:  [id number]

## 2011-04-19 ENCOUNTER — Other Ambulatory Visit: Payer: Self-pay | Admitting: *Deleted

## 2011-04-19 MED ORDER — TERAZOSIN HCL 10 MG PO CAPS: 10.0000 mg | ORAL_CAPSULE | Freq: Every day | ORAL | Status: DC

## 2011-04-19 MED ORDER — GLIPIZIDE ER 10 MG PO TB24: 10.0000 mg | ORAL_TABLET | Freq: Every day | ORAL | Status: DC

## 2011-06-22 ENCOUNTER — Encounter: Payer: Self-pay | Admitting: Internal Medicine

## 2011-06-22 ENCOUNTER — Ambulatory Visit (INDEPENDENT_AMBULATORY_CARE_PROVIDER_SITE_OTHER): Payer: Medicare Other | Admitting: Internal Medicine

## 2011-06-22 DIAGNOSIS — E785 Hyperlipidemia, unspecified: Secondary | ICD-10-CM

## 2011-06-22 DIAGNOSIS — I10 Essential (primary) hypertension: Secondary | ICD-10-CM

## 2011-06-22 DIAGNOSIS — N186 End stage renal disease: Secondary | ICD-10-CM

## 2011-06-22 DIAGNOSIS — E119 Type 2 diabetes mellitus without complications: Secondary | ICD-10-CM

## 2011-06-22 LAB — HEPATIC FUNCTION PANEL
ALT: 25 U/L (ref 0–35)
AST: 20 U/L (ref 0–37)
Albumin: 3.6 g/dL (ref 3.5–5.2)

## 2011-06-22 LAB — CBC WITH DIFFERENTIAL/PLATELET
Basophils Relative: 0.4 % (ref 0.0–3.0)
Eosinophils Relative: 3.3 % (ref 0.0–5.0)
Lymphocytes Relative: 28.3 % (ref 12.0–46.0)
Monocytes Absolute: 0.5 10*3/uL (ref 0.1–1.0)
Monocytes Relative: 8.5 % (ref 3.0–12.0)
Neutrophils Relative %: 59.5 % (ref 43.0–77.0)
Platelets: 211 10*3/uL (ref 150.0–400.0)
RBC: 3.11 Mil/uL — ABNORMAL LOW (ref 3.87–5.11)
WBC: 6.4 10*3/uL (ref 4.5–10.5)

## 2011-06-22 LAB — LDL CHOLESTEROL, DIRECT: Direct LDL: 115.4 mg/dL

## 2011-06-22 LAB — LIPID PANEL
HDL: 42.6 mg/dL (ref >39.00)
Triglycerides: 225 mg/dL — ABNORMAL HIGH (ref 0.0–149.0)
VLDL: 45 mg/dL — ABNORMAL HIGH (ref 0.0–40.0)

## 2011-06-22 NOTE — Assessment & Plan Note (Signed)
Control appears to be markedly better with changes from Dr Gabriel Carina Will defer diabetic management to her Check labs today and send to her

## 2011-06-22 NOTE — Assessment & Plan Note (Signed)
Doing well with peritoneal dialysis Adjusting to the feeling and generally tolerating well

## 2011-06-22 NOTE — Progress Notes (Signed)
Subjective:    Patient ID: Laurie Steele, female    DOB: [birth year], 70 y.o.   MRN: [id number]  HPI Has been trying to eat better Weight is down 8# Has been trying to go to pool for exercise twice a week (and machines there also)  Checks sugars bid now Averaging 120's at night, lower fasting in AM Hasn't had any over 200  Has adjusted down on the dextrose percentage in her dialysate Saw Dr Gabriel Carina endocrinologist Started her on novolog and sugars are much better Has been more used to dialysis---still doesn't like the feeling in her abdomen  No chest pain No SOB Occ edema but nothing persistent  Still on statin No apparent problems  Current Outpatient Prescriptions on File Prior to Visit  Medication Sig Dispense Refill  . allopurinol (ZYLOPRIM) 100 MG tablet Take 100 mg by mouth daily. To prevent gout       . amLODipine (NORVASC) 5 MG tablet Take 5 mg by mouth daily.        . calcitRIOL (ROCALTROL) 0.5 MCG capsule Take 0.5 mcg by mouth daily.        . cloNIDine (CATAPRES) 0.3 MG tablet Take 0.3 mg by mouth 2 (two) times daily.        Marland Kitchen glipiZIDE (GLUCOTROL) 10 MG 24 hr tablet Take 1 tablet (10 mg total) by mouth daily.  30 tablet  11  . hydrALAZINE (APRESOLINE) 50 MG tablet Take 50 mg by mouth 3 (three) times daily.        . insulin glargine (LANTUS) 100 UNIT/ML injection Inject 40 Units into the skin every morning. And 50 units in the evening      . Insulin Syringe-Needle U-100 (B-D INS SYRINGE 0.5CC/31GX5/16) 31G X 5/16" 0.5 ML MISC by Does not apply route as directed.        Marland Kitchen losartan (COZAAR) 50 MG tablet Take 50 mg by mouth daily.        . pravastatin (PRAVACHOL) 80 MG tablet Take 80 mg by mouth. 1/2 by mouth once a day       . sevelamer (RENVELA) 800 MG tablet Take 800 mg by mouth 3 (three) times daily with meals.        . terazosin (HYTRIN) 10 MG capsule Take 1 capsule (10 mg total) by mouth at bedtime.  30 capsule  11    No Known Allergies  Past Medical History    Diagnosis Date  . Hx of colonic polyps   . Hyperlipidemia   . Hypertension   . Renal failure     Dr Holley Raring  . 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 Chronic anemia     Dr Inez Pilgrim  . Glaucoma   . GERD (gastroesophageal reflux disease)   . Gout   . Miscarriage   . Vaginal delivery     x 4    Past Surgical History  Procedure Date  . Cholecystectomy   . Abdominal hysterectomy 1983  . Back surgery 6/08    Dr Collier Salina  . Back surgery 1987    disc    Family History  Problem Relation Age of Onset  . Heart attack Father   . Hypertension Mother   . Hyperlipidemia Mother   . Coronary artery disease      strong fam hx  History   Social History  . Marital Status: Divorced    Spouse Name: N/A    Number of Children: 70  . Years of Education: N/A   Occupational History  . retired     [employer]   Social History Main Topics  . Smoking status: Former Smoker    Quit date: 11/05/1990  . Smokeless tobacco: Not on file  . Alcohol Use: No  . Drug Use: Not on file  . Sexually Active: Not on file   Other Topics Concern  . Not on file   Social History Narrative  . No narrative on file   Review of Systems Appetite is fair Sleeps okay--occ affected by her abdominal fullness Generally happy and satisfied    Objective:   Physical Exam  Constitutional: She appears well-developed and well-nourished. No distress.  Neck: Normal range of motion. Neck supple.  Cardiovascular: Normal rate, regular rhythm, normal heart sounds and intact distal pulses.  Exam reveals no gallop.   No murmur heard. Pulmonary/Chest: Effort normal and breath sounds normal. No respiratory distress. She has no wheezes. She has no rales.  Musculoskeletal: Normal range of motion. She exhibits no edema and no tenderness.  Lymphadenopathy:     She has no cervical adenopathy.  Psychiatric: She has a normal mood and affect. Her behavior is normal. Judgment and thought content normal.          Assessment & Plan:

## 2011-06-22 NOTE — Assessment & Plan Note (Signed)
No problems with the med Will check labs

## 2011-06-22 NOTE — Assessment & Plan Note (Signed)
BP Readings from Last 3 Encounters:  06/22/11 140/80  02/20/11 138/60  10/23/10 112/62   Good control No changes needed

## 2011-06-26 ENCOUNTER — Telehealth: Payer: Self-pay | Admitting: *Deleted

## 2011-06-26 ENCOUNTER — Encounter: Payer: Self-pay | Admitting: *Deleted

## 2011-06-26 NOTE — Telephone Encounter (Signed)
.  left message to have patient return my call.  

## 2011-06-26 NOTE — Telephone Encounter (Signed)
Message copied by Despina Hidden on Tue Jun 26, 2011  3:38 PM ------      Message from: Viviana Simpler I      Created: Fri Jun 22, 2011  3:55 PM       Please call      Diabetic control is much better with HgbA1c down to 8.1%.      Chol pretty good with total of 196 and LDL or bad chol of 115. Please continue the pravastatin      Blood count shows anemia still but this has been about the same for 3 years and is related to the kidney failure      Liver tests are normal            Copy to Dr Gabriel Carina

## 2011-06-26 NOTE — Telephone Encounter (Signed)
Patient called back and I advised results. Labs faxed to [facility]

## 2011-08-01 ENCOUNTER — Other Ambulatory Visit: Payer: Self-pay | Admitting: *Deleted

## 2011-08-01 MED ORDER — CLONIDINE HCL 0.3 MG PO TABS: 0.3000 mg | ORAL_TABLET | Freq: Two times a day (BID) | ORAL | Status: DC

## 2011-08-12 ENCOUNTER — Emergency Department: Payer: Self-pay | Admitting: Emergency Medicine

## 2011-08-16 ENCOUNTER — Encounter: Payer: Self-pay | Admitting: Family Medicine

## 2011-08-16 ENCOUNTER — Ambulatory Visit (INDEPENDENT_AMBULATORY_CARE_PROVIDER_SITE_OTHER): Payer: Medicare Other | Admitting: Family Medicine

## 2011-08-16 VITALS — BP 104/60 | HR 56 | Temp 97.5°F

## 2011-08-16 DIAGNOSIS — R05 Cough: Secondary | ICD-10-CM | POA: Insufficient documentation

## 2011-08-16 MED ORDER — AZITHROMYCIN 250 MG PO TABS: ORAL_TABLET | ORAL | Status: AC

## 2011-08-16 NOTE — Patient Instructions (Signed)
I think you have continued bronchitis. Add on zpack (another antibiotic). Get plenty of rest, continue tessalon perls. Please let us know if fever >101.5, or worsening cough or trouble breathing or not improving as expected. Good to see you today, call us with questions.

## 2011-08-16 NOTE — Progress Notes (Signed)
  Subjective:    Patient ID: Laurie Steele, female    DOB: 1941-04-11, 70 y.o.   MRN: [id number]  HPI CC: cough  Presents with caregiver, son.  2wk h/o coughing.  Cough productive of white thick sputum.  At night trouble sleeping from cough.  Chest and stomach pain, HA from coughing.  Mild sinus/head congestion.  Seen at [facility] on Sunday, dx bronchitis and placed on amoxicillin 500mg  tid and tessalon perls.  Taking for 4 days.  Did vomit 2 wks ago, thinks had stomach virus.  None since.  No fevers, ST, ear pain or tooth pain, other abd pain, n/d.    On peritoneal dialysis, nightly.  Kidney doctor is Dr. Holley Raring.  Thinks DM doing well.  No sick contacts.  Smokers at home, smoke outside.  No h/o asthma, COPD.  Review of Systems per HPI    Objective:   Physical Exam  Nursing note and vitals reviewed. Constitutional: She appears well-nourished. No distress.  HENT:  Head: Normocephalic and atraumatic.  Right Ear: Hearing, tympanic membrane, external ear and ear canal normal.  Left Ear: Hearing, tympanic membrane, external ear and ear canal normal.  Nose: Mucosal edema present. No rhinorrhea. Right sinus exhibits no maxillary sinus tenderness and no frontal sinus tenderness. Left sinus exhibits maxillary sinus tenderness. Left sinus exhibits no frontal sinus tenderness.  Mouth/Throat: Uvula is midline, oropharynx is clear and moist and mucous membranes are normal. No oropharyngeal exudate, posterior oropharyngeal edema, posterior oropharyngeal erythema or tonsillar abscesses.  Eyes: Conjunctivae and EOM are normal. Pupils are equal, round, and reactive to light.  Neck: Normal range of motion. Neck supple.  Cardiovascular: Normal rate, regular rhythm, normal heart sounds and intact distal pulses.   No murmur heard. Pulmonary/Chest: Effort normal and breath sounds normal. No respiratory distress. She has no wheezes. She has no rales.  Lymphadenopathy:    She has no cervical adenopathy.    Skin: Skin is warm and dry. No rash noted.  Psychiatric: She has a normal mood and affect.      Assessment & Plan:

## 2011-08-16 NOTE — Assessment & Plan Note (Addendum)
With congestion.  Trouble sleeping at night.   Bronchitis. Will add on zpack to better cover atypicals in pt on nightly peritoneal dialysis with 2+ wk h/o cough. Advised to notify us if not improving as expected or any worsening. No evidence of PNA on exam, good O2.

## 2011-08-23 LAB — HEMOGLOBIN AND HEMATOCRIT, BLOOD
HCT: 25.7 — ABNORMAL LOW
HCT: 29 — ABNORMAL LOW
Hemoglobin: 8.6 — ABNORMAL LOW
Hemoglobin: 9.6 — ABNORMAL LOW

## 2011-08-23 LAB — URINALYSIS, ROUTINE W REFLEX MICROSCOPIC
Glucose, UA: NEGATIVE
Nitrite: POSITIVE — AB
Protein, ur: 100 — AB
Protein, ur: 30 — AB
Urobilinogen, UA: 0.2
Urobilinogen, UA: 0.2

## 2011-08-23 LAB — BASIC METABOLIC PANEL
BUN: 49 — ABNORMAL HIGH
CO2: 27
Chloride: 107
Creatinine, Ser: 2 — ABNORMAL HIGH
Glucose, Bld: 120 — ABNORMAL HIGH

## 2011-08-23 LAB — ABO/RH: ABO/RH(D): O POS

## 2011-08-23 LAB — URINE MICROSCOPIC-ADD ON

## 2011-08-23 LAB — APTT: aPTT: 30

## 2011-09-03 ENCOUNTER — Other Ambulatory Visit: Payer: Self-pay | Admitting: *Deleted

## 2011-09-03 MED ORDER — PRAVASTATIN SODIUM 80 MG PO TABS: 80.0000 mg | ORAL_TABLET | Freq: Every day | ORAL | Status: DC

## 2011-09-03 NOTE — Telephone Encounter (Signed)
rx sent to pharmacy by e-script  

## 2011-10-11 ENCOUNTER — Encounter: Payer: Self-pay | Admitting: Internal Medicine

## 2011-11-14 ENCOUNTER — Other Ambulatory Visit: Payer: Self-pay | Admitting: *Deleted

## 2011-11-14 MED ORDER — "INSULIN SYRINGE-NEEDLE U-100 31G X 5/16"" 0.5 ML MISC": Status: DC

## 2011-12-18 ENCOUNTER — Encounter: Payer: Self-pay | Admitting: Internal Medicine

## 2011-12-18 ENCOUNTER — Ambulatory Visit (INDEPENDENT_AMBULATORY_CARE_PROVIDER_SITE_OTHER): Payer: Medicare Other | Admitting: Internal Medicine

## 2011-12-18 DIAGNOSIS — G479 Sleep disorder, unspecified: Secondary | ICD-10-CM

## 2011-12-18 DIAGNOSIS — N058 Unspecified nephritic syndrome with other morphologic changes: Secondary | ICD-10-CM

## 2011-12-18 DIAGNOSIS — N186 End stage renal disease: Secondary | ICD-10-CM

## 2011-12-18 DIAGNOSIS — G478 Other sleep disorders: Secondary | ICD-10-CM

## 2011-12-18 DIAGNOSIS — I10 Essential (primary) hypertension: Secondary | ICD-10-CM

## 2011-12-18 DIAGNOSIS — E1129 Type 2 diabetes mellitus with other diabetic kidney complication: Secondary | ICD-10-CM | POA: Insufficient documentation

## 2011-12-18 DIAGNOSIS — E785 Hyperlipidemia, unspecified: Secondary | ICD-10-CM

## 2011-12-18 NOTE — Assessment & Plan Note (Signed)
Dr Gabriel Carina is managing  I will let her check the labs and send me report Seems to be reasonably controlled

## 2011-12-18 NOTE — Assessment & Plan Note (Signed)
Uncomfortable with the peritoneal dialysis but is dealing with it

## 2011-12-18 NOTE — Progress Notes (Signed)
Subjective:    Patient ID: Laurie Steele, female    DOB: 1941-08-22, 71 y.o.   MRN: [id number]  HPI Feels tired Notes back pain during the PD dwells---tired feeling Dialysis nurse did cut down the volume from 2000 to 1500cc but has increased from 4-5 exchanges Being considered for renal transplant  Still sees Dr Gabriel Carina for diabetes Hard to keep up with all her appts Checks sugars bid--doesn't adjust now  Occ mild hypoglycemic reactions--normally if gets off machine and not having the dialysate solution in her  Did have chills and felt hot a few weeks ago EMS checked her out but she declined ER visit Did not recur  No chest pain Only SOB is when she is carrying dwell in her belly  Some trouble sleeping at times Hard getting comfortable with abdomen full of fluid  Current Outpatient Prescriptions on File Prior to Visit  Medication Sig Dispense Refill  . allopurinol (ZYLOPRIM) 100 MG tablet Take 100 mg by mouth daily. To prevent gout       . amLODipine (NORVASC) 5 MG tablet Take 5 mg by mouth daily.        Marland Kitchen amoxicillin (AMOXIL) 500 MG capsule Take 500 mg by mouth 3 (three) times daily.        . benzonatate (TESSALON) 100 MG capsule Take 100-200 mg by mouth 3 (three) times daily as needed.        . calcitRIOL (ROCALTROL) 0.5 MCG capsule Take 0.5 mcg by mouth daily.        . cinacalcet (SENSIPAR) 30 MG tablet Take 30 mg by mouth daily.        . cloNIDine (CATAPRES) 0.3 MG tablet Take 1 tablet (0.3 mg total) by mouth 2 (two) times daily.  60 tablet  11  . FREESTYLE LITE test strip 2 (two) times daily as needed.       Marland Kitchen glipiZIDE (GLUCOTROL) 10 MG 24 hr tablet Take 1 tablet (10 mg total) by mouth daily.  30 tablet  11  . hydrALAZINE (APRESOLINE) 50 MG tablet Take 50 mg by mouth 3 (three) times daily.        . insulin glargine (LANTUS) 100 UNIT/ML injection Inject 80 Units into the skin at bedtime.       . Insulin Syringe-Needle U-100 (B-D INS SYRINGE 0.5CC/31GX5/16) 31G X 5/16" 0.5  ML MISC Use as directed  100 each  3  . Lancets (FREESTYLE) lancets 2 (two) times daily as needed.       Marland Kitchen losartan (COZAAR) 50 MG tablet Take 50 mg by mouth daily.        . multivitamin (RENA-VIT) TABS tablet Take 1 tablet by mouth daily.        Marland Kitchen NOVOLOG 100 UNIT/ML injection Take 20 units with breakfast and 32 units with supper      . pravastatin (PRAVACHOL) 80 MG tablet Take 1 tablet (80 mg total) by mouth daily.  30 tablet  11  . sevelamer (RENVELA) 800 MG tablet Take 800 mg by mouth 3 (three) times daily with meals.        . terazosin (HYTRIN) 10 MG capsule Take 1 capsule (10 mg total) by mouth at bedtime.  30 capsule  11    No Known Allergies  Past Medical History  Diagnosis Date  . Hx of colonic polyps   . Hyperlipidemia   . Hypertension   . Renal failure     Dr Holley Raring  . NIDDM (non-insulin dependent diabetes mellitus)  with retinopathy , and nephropathy  . 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 Chronic anemia     Dr Inez Pilgrim  . Glaucoma   . GERD (gastroesophageal reflux disease)   . Gout   . Miscarriage   . Vaginal delivery     x 4    Past Surgical History  Procedure Date  . Cholecystectomy   . Abdominal hysterectomy 1983  . Back surgery 6/08    Dr Collier Salina  . Back surgery 1987    disc    Family History  Problem Relation Age of Onset  . Heart attack Father   . Hypertension Mother   . Hyperlipidemia Mother   . Coronary artery disease      strong fam hx    History   Social History  . Marital Status: Divorced    Spouse Name: N/A    Number of Children: 22  . Years of Education: N/A   Occupational History  . retired     [employer]   Social History Main Topics  . Smoking status: Former Smoker    Quit date: 11/05/1990  . Smokeless tobacco: Not on file  . Alcohol Use: No  . Drug Use: Not on file  . Sexually Active: Not on file   Other Topics Concern  . Not on file    Social History Narrative  . No narrative on file   Review of Systems Bowels are okay on miralax  Still voids--pretty frequent Has nasal drainage--this causes cough     Objective:   Physical Exam  Constitutional: She appears well-developed and well-nourished. No distress.  Neck: Normal range of motion. Neck supple.  Cardiovascular: Normal rate, regular rhythm and normal heart sounds.  Exam reveals no gallop.   No murmur heard. Pulmonary/Chest: Effort normal and breath sounds normal. No respiratory distress. She has no wheezes. She has no rales.  Abdominal: Soft. There is no tenderness.  Musculoskeletal: She exhibits no edema and no tenderness.  Lymphadenopathy:    She has no cervical adenopathy.  Psychiatric: She has a normal mood and affect. Her behavior is normal.          Assessment & Plan:

## 2011-12-18 NOTE — Assessment & Plan Note (Signed)
BP Readings from Last 3 Encounters:  12/18/11 130/60  08/16/11 104/60  06/22/11 140/80   Good control No changes needed

## 2011-12-18 NOTE — Assessment & Plan Note (Signed)
Lab Results  Component Value Date   LDLCALC 116* 10/05/2010   Fair control Last LDL 115 Not clear that aggressive statins are useful in dialysis patients so will not increase meds

## 2011-12-18 NOTE — Assessment & Plan Note (Signed)
Mostly from PD discomfort No meds

## 2011-12-24 ENCOUNTER — Other Ambulatory Visit: Payer: Self-pay | Admitting: *Deleted

## 2011-12-24 MED ORDER — INSULIN GLARGINE 100 UNIT/ML ~~LOC~~ SOLN: 80.0000 [IU] | Freq: Every day | SUBCUTANEOUS | Status: DC

## 2012-01-30 ENCOUNTER — Other Ambulatory Visit: Payer: Self-pay | Admitting: *Deleted

## 2012-01-30 MED ORDER — FREESTYLE LANCETS MISC: Status: DC

## 2012-02-29 ENCOUNTER — Emergency Department: Payer: Self-pay | Admitting: Emergency Medicine

## 2012-02-29 LAB — COMPREHENSIVE METABOLIC PANEL
Albumin: 3.1 g/dL — ABNORMAL LOW (ref 3.4–5.0)
Alkaline Phosphatase: 89 U/L (ref 50–136)
BUN: 63 mg/dL — ABNORMAL HIGH (ref 7–18)
Bilirubin,Total: 0.5 mg/dL (ref 0.2–1.0)
Calcium, Total: 8.8 mg/dL (ref 8.5–10.1)
Chloride: 105 mmol/L (ref 98–107)
EGFR (African American): 7 — ABNORMAL LOW
EGFR (Non-African Amer.): 6 — ABNORMAL LOW
Osmolality: 305 (ref 275–301)
SGOT(AST): 26 U/L (ref 15–37)

## 2012-02-29 LAB — URINALYSIS, COMPLETE
Bacteria: NONE SEEN
Ph: 8 (ref 4.5–8.0)
Protein: 100
RBC,UR: 1 /HPF (ref 0–5)
Specific Gravity: 1.008 (ref 1.003–1.030)
Squamous Epithelial: 1

## 2012-02-29 LAB — CBC WITH DIFFERENTIAL/PLATELET
Basophil #: 0.1 10*3/uL (ref 0.0–0.1)
Eosinophil #: 0.1 10*3/uL (ref 0.0–0.7)
HCT: 32.4 % — ABNORMAL LOW (ref 35.0–47.0)
HGB: 10.7 g/dL — ABNORMAL LOW (ref 12.0–16.0)
Lymphocyte #: 1.5 10*3/uL (ref 1.0–3.6)
Lymphocyte %: 14.2 %
MCH: 35 pg — ABNORMAL HIGH (ref 26.0–34.0)
MCV: 106 fL — ABNORMAL HIGH (ref 80–100)
Monocyte #: 0.6 x10 3/mm (ref 0.2–0.9)
Monocyte %: 5.5 %
Neutrophil %: 78.6 %
RDW: 14.7 % — ABNORMAL HIGH (ref 11.5–14.5)
WBC: 10.7 10*3/uL (ref 3.6–11.0)

## 2012-03-03 ENCOUNTER — Ambulatory Visit (INDEPENDENT_AMBULATORY_CARE_PROVIDER_SITE_OTHER): Payer: Medicare Other | Admitting: Family Medicine

## 2012-03-03 ENCOUNTER — Encounter: Payer: Self-pay | Admitting: Family Medicine

## 2012-03-03 ENCOUNTER — Telehealth: Payer: Self-pay | Admitting: Internal Medicine

## 2012-03-03 VITALS — BP 150/60 | HR 66 | Temp 97.8°F | Ht 67.0 in | Wt 223.2 lb

## 2012-03-03 DIAGNOSIS — J40 Bronchitis, not specified as acute or chronic: Secondary | ICD-10-CM

## 2012-03-03 MED ORDER — AZITHROMYCIN 250 MG PO TABS: ORAL_TABLET | ORAL | Status: AC

## 2012-03-03 NOTE — Progress Notes (Signed)
Subjective:    Patient ID: Laurie Steele, female    DOB: 1941/04/22, 71 y.o.   MRN: [id number]  HPI Here for productive cough   Started at least a week ago -- post nasal drip and sore throat - then burning in chest and cough  Eyes are gooky- some crust  No fever - but had chills a couple of times  Phlegm - is green/ yellow phlegm  Feels a little wheeze when she lies down  Non smoker- quit many years ago   No lung dz or copd or asthma known   Took some tussin otc for cough -- helps some  Took claritin too  Is also a dialysis patient     Pulse ox is 97%  Patient Active Problem List  Diagnoses  . HYPERLIPIDEMIA  . GOUT  . ANEMIA, CHRONIC  . NEUROPATHY  . HYPERTENSION  . GERD  . SLEEP DISORDER  . END STAGE RENAL DISEASE  . Type II or unspecified type diabetes mellitus with renal manifestations, not stated as uncontrolled  . Bronchitis   Past Medical History  Diagnosis Date  . Hx of colonic polyps   . Hyperlipidemia   . Hypertension   . Renal failure     Dr Holley Raring  . 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 Chronic anemia     Dr Inez Pilgrim  . Glaucoma   . GERD (gastroesophageal reflux disease)   . Gout   . Miscarriage   . Vaginal delivery     x 4   Past Surgical History  Procedure Date  . Cholecystectomy   . Abdominal hysterectomy 1983  . Back surgery 6/08    Dr Collier Salina  . Back surgery 1987    disc   History  Substance Use Topics  . Smoking status: Former Smoker    Quit date: 11/05/1990  . Smokeless tobacco: Never Used  . Alcohol Use: No   Family History  Problem Relation Age of Onset  . Heart attack Father   . Hypertension Mother   . Hyperlipidemia Mother   . Coronary artery disease      strong fam hx   No Known Allergies Current Outpatient Prescriptions on File Prior to Visit  Medication  Sig Dispense Refill  . allopurinol (ZYLOPRIM) 100 MG tablet Take 100 mg by mouth daily. To prevent gout       . amLODipine (NORVASC) 5 MG tablet Take 5 mg by mouth daily.        . calcitRIOL (ROCALTROL) 0.5 MCG capsule Take 0.5 mcg by mouth daily.        . cinacalcet (SENSIPAR) 30 MG tablet Take 30 mg by mouth daily.        . cloNIDine (CATAPRES) 0.3 MG tablet Take 1 tablet (0.3 mg total) by mouth 2 (two) times daily.  60 tablet  11  . FREESTYLE LITE test strip 2 (two) times daily as needed.       Marland Kitchen FREESTYLE LITE test strip       . glipiZIDE (GLUCOTROL) 10 MG 24 hr tablet Take 1 tablet (10 mg total) by mouth daily.  30 tablet  11  . hydrALAZINE (APRESOLINE) 50 MG tablet Take 50 mg by mouth 3 (three) times daily.        . insulin glargine (LANTUS) 100 UNIT/ML injection  Inject 80 Units into the skin at bedtime.  900 mL  11  . Insulin Syringe-Needle U-100 (B-D INS SYRINGE 0.5CC/31GX5/16) 31G X 5/16" 0.5 ML MISC Use as directed  100 each  3  . Lancets (FREESTYLE) lancets two times daily as needed. Dx: 250.40  100 each  6  . Lancets (FREESTYLE) lancets       . losartan (COZAAR) 50 MG tablet Take 50 mg by mouth daily.        . multivitamin (RENA-VIT) TABS tablet Take 1 tablet by mouth daily.        Marland Kitchen NOVOLOG 100 UNIT/ML injection Take 20 units with breakfast and 32 units with supper      . pravastatin (PRAVACHOL) 80 MG tablet Take 1 tablet (80 mg total) by mouth daily.  30 tablet  11  . sevelamer (RENVELA) 800 MG tablet Take 800 mg by mouth 3 (three) times daily with meals.        . terazosin (HYTRIN) 10 MG capsule Take 1 capsule (10 mg total) by mouth at bedtime.  30 capsule  11       Review of Systems Review of Systems  Constitutional: Negative for fever, appetite change, and unexpected weight change.  Eyes: Negative for pain and visual disturbance.  ENT pos for cong/ rhinorrhea, facial pain mild  Respiratory: Negative for sob or wheeze/ pos for harsh cough  Cardiovascular: Negative for cp  or palpitations    Gastrointestinal: Negative for nausea, diarrhea and constipation.  Genitourinary: Negative for urgency and frequency.  Skin: Negative for pallor or rash   Neurological: Negative for weakness, light-headedness, numbness and headaches.  Hematological: Negative for adenopathy. Does not bruise/bleed easily.  Psychiatric/Behavioral: Negative for dysphoric mood. The patient is not nervous/anxious.         Objective:   Physical Exam  Constitutional: She appears well-developed and well-nourished. No distress.  HENT:  Head: Normocephalic and atraumatic.  Right Ear: External ear normal.  Left Ear: External ear normal.  Mouth/Throat: Oropharynx is clear and moist. No oropharyngeal exudate.       Nares are injected and congested  Mild maxillary sinus tenderness bilat  Throat- post nasal drip   Eyes: Conjunctivae and EOM are normal. Pupils are equal, round, and reactive to light. Right eye exhibits no discharge. Left eye exhibits no discharge.  Neck: Normal range of motion. Neck supple. No JVD present. Carotid bruit is not present. No thyromegaly present.  Cardiovascular: Normal rate, regular rhythm, normal heart sounds and intact distal pulses.  Exam reveals no gallop.   Pulmonary/Chest: Effort normal and breath sounds normal. No respiratory distress. She has no wheezes. She has no rales.       Harsh bs diffusely , no rales or wheeze occ scattered rhonchi Not sob   Lymphadenopathy:    She has no cervical adenopathy.  Neurological: She is alert.  Skin: Skin is warm and dry.  Psychiatric: She has a normal mood and affect.          Assessment & Plan:

## 2012-03-03 NOTE — Patient Instructions (Signed)
Take the zithromax for bronchitis (you may also be starting a sinus infection )  Nasal saline spray is good for congestion Continue tussin for cough  Get rest and fluids  Update if not starting to improve in a week or if worsening

## 2012-03-03 NOTE — Telephone Encounter (Signed)
aller: Nyala/Patient; PCP: Viviana Simpler I.; CB#[id number]; ; ; Call regarding Cough/Congestion;  Pt is calling for an appt today/ transferred to the nurse by the office. Pt has had a cough x 1 week. No fever. She is sometimes short of breath from the congestion . Rn triaged per cough and r/o emergent sx. Rn scheduled pt for 11L45 today with Dr. Glori Bickers.

## 2012-03-03 NOTE — Telephone Encounter (Signed)
Okay 

## 2012-03-03 NOTE — Assessment & Plan Note (Signed)
With prod cough and also some maxillary sinus tenderness Pt is also dialysis patient  Will cover with zpak and monitor  Disc symptomatic care - see instructions on AVS  Update if not starting to improve in a week or if worsening  e

## 2012-03-18 ENCOUNTER — Other Ambulatory Visit: Payer: Self-pay | Admitting: *Deleted

## 2012-03-18 MED ORDER — GLUCOSE BLOOD VI STRP: ORAL_STRIP | Status: DC

## 2012-04-14 ENCOUNTER — Other Ambulatory Visit: Payer: Self-pay | Admitting: *Deleted

## 2012-04-14 MED ORDER — GLIPIZIDE ER 10 MG PO TB24: 10.0000 mg | ORAL_TABLET | Freq: Every day | ORAL | Status: DC

## 2012-04-14 MED ORDER — "INSULIN SYRINGE-NEEDLE U-100 31G X 5/16"" 0.5 ML MISC": Status: DC

## 2012-04-14 MED ORDER — TERAZOSIN HCL 10 MG PO CAPS: 10.0000 mg | ORAL_CAPSULE | Freq: Every day | ORAL | Status: DC

## 2012-07-14 ENCOUNTER — Other Ambulatory Visit: Payer: Self-pay | Admitting: *Deleted

## 2012-07-14 MED ORDER — CLONIDINE HCL 0.3 MG PO TABS: 0.3000 mg | ORAL_TABLET | Freq: Two times a day (BID) | ORAL | Status: DC

## 2012-07-17 ENCOUNTER — Telehealth: Payer: Self-pay

## 2012-07-17 DIAGNOSIS — Z1231 Encounter for screening mammogram for malignant neoplasm of breast: Secondary | ICD-10-CM

## 2012-07-17 NOTE — Telephone Encounter (Signed)
Pt request referral for mammogram to [facility]. Pt trying to get on Kidney transplant list at [facility] and mammogram is required before can get on list.Please advise.

## 2012-07-18 NOTE — Telephone Encounter (Signed)
[facility] appt made patient aware.

## 2012-08-08 ENCOUNTER — Ambulatory Visit: Payer: Self-pay | Admitting: Internal Medicine

## 2012-08-11 ENCOUNTER — Encounter: Payer: Self-pay | Admitting: *Deleted

## 2012-08-17 ENCOUNTER — Emergency Department: Payer: Self-pay | Admitting: Emergency Medicine

## 2012-08-18 LAB — URINALYSIS, COMPLETE
Bilirubin,UR: NEGATIVE
Glucose,UR: 150 mg/dL (ref 0–75)
Nitrite: NEGATIVE
Protein: 100
RBC,UR: 1 /HPF (ref 0–5)
WBC UR: 7 /HPF (ref 0–5)

## 2012-08-18 LAB — CBC WITH DIFFERENTIAL/PLATELET
Basophil %: 0.5 %
HCT: 32.7 % — ABNORMAL LOW (ref 35.0–47.0)
HGB: 11.1 g/dL — ABNORMAL LOW (ref 12.0–16.0)
Lymphocyte %: 19.6 %
MCHC: 34 g/dL (ref 32.0–36.0)
Monocyte %: 8.1 %
Neutrophil #: 7.8 10*3/uL — ABNORMAL HIGH (ref 1.4–6.5)
WBC: 11 10*3/uL (ref 3.6–11.0)

## 2012-08-18 LAB — COMPREHENSIVE METABOLIC PANEL
Albumin: 3.4 g/dL (ref 3.4–5.0)
Calcium, Total: 9 mg/dL (ref 8.5–10.1)
Co2: 27 mmol/L (ref 21–32)
Glucose: 79 mg/dL (ref 65–99)
Potassium: 3.6 mmol/L (ref 3.5–5.1)
SGOT(AST): 28 U/L (ref 15–37)
Total Protein: 7.2 g/dL (ref 6.4–8.2)

## 2012-08-18 LAB — BODY FLUID CELL COUNT WITH DIFFERENTIAL
Eosinophil: 4 %
Nucleated Cell Count: 716 /mm3

## 2012-08-19 LAB — URINE CULTURE

## 2012-08-22 LAB — BODY FLUID CULTURE

## 2012-09-01 ENCOUNTER — Telehealth: Payer: Self-pay | Admitting: Internal Medicine

## 2012-09-01 NOTE — Telephone Encounter (Signed)
I understand Please have her request records if her new physician wants them

## 2012-09-01 NOTE — Telephone Encounter (Signed)
Patient called to let you know she has found another PCP and she cancelled her appointments.  Patient said it was difficult for her to drive to our office and she found a PCP closer to her home.  She said you took very good care of her and she appreciates it.

## 2012-09-04 ENCOUNTER — Ambulatory Visit: Payer: Self-pay | Admitting: Gastroenterology

## 2012-10-28 ENCOUNTER — Other Ambulatory Visit: Payer: Self-pay | Admitting: *Deleted

## 2012-10-28 MED ORDER — PRAVASTATIN SODIUM 80 MG PO TABS: 80.0000 mg | ORAL_TABLET | Freq: Every day | ORAL | Status: DC

## 2012-11-28 ENCOUNTER — Observation Stay: Payer: Self-pay | Admitting: Internal Medicine

## 2012-11-28 LAB — HEPATIC FUNCTION PANEL A (ARMC)
Albumin: 3.3 g/dL — ABNORMAL LOW (ref 3.4–5.0)
Alkaline Phosphatase: 191 U/L — ABNORMAL HIGH (ref 50–136)
Bilirubin,Total: 0.2 mg/dL (ref 0.2–1.0)
SGOT(AST): 26 U/L (ref 15–37)
SGPT (ALT): 40 U/L (ref 12–78)

## 2012-11-28 LAB — CK TOTAL AND CKMB (NOT AT ARMC)
CK, Total: 69 U/L (ref 21–215)
CK-MB: 1.4 ng/mL (ref 0.5–3.6)

## 2012-11-28 LAB — MAGNESIUM: Magnesium: 1.9 mg/dL

## 2012-11-28 LAB — BASIC METABOLIC PANEL
Anion Gap: 10 (ref 7–16)
Calcium, Total: 9.4 mg/dL (ref 8.5–10.1)
Chloride: 107 mmol/L (ref 98–107)
Co2: 25 mmol/L (ref 21–32)
EGFR (African American): 8 — ABNORMAL LOW
EGFR (Non-African Amer.): 7 — ABNORMAL LOW
Glucose: 234 mg/dL — ABNORMAL HIGH (ref 65–99)
Osmolality: 305 (ref 275–301)
Potassium: 4 mmol/L (ref 3.5–5.1)

## 2012-11-28 LAB — CBC
HCT: 32.7 % — ABNORMAL LOW (ref 35.0–47.0)
MCH: 36 pg — ABNORMAL HIGH (ref 26.0–34.0)
MCHC: 34.1 g/dL (ref 32.0–36.0)
MCV: 106 fL — ABNORMAL HIGH (ref 80–100)
Platelet: 213 10*3/uL (ref 150–440)
RDW: 14.6 % — ABNORMAL HIGH (ref 11.5–14.5)
WBC: 8.7 10*3/uL (ref 3.6–11.0)

## 2012-11-28 LAB — PHOSPHORUS: Phosphorus: 4.2 mg/dL (ref 2.5–4.9)

## 2012-11-29 LAB — TROPONIN I
Troponin-I: 0.26 ng/mL — ABNORMAL HIGH
Troponin-I: 0.75 ng/mL — ABNORMAL HIGH

## 2012-11-29 LAB — LIPID PANEL
Cholesterol: 164 mg/dL (ref 0–200)
HDL Cholesterol: 37 mg/dL — ABNORMAL LOW (ref 40–60)
Ldl Cholesterol, Calc: 68 mg/dL (ref 0–100)
Triglycerides: 293 mg/dL — ABNORMAL HIGH (ref 0–200)
VLDL Cholesterol, Calc: 59 mg/dL — ABNORMAL HIGH (ref 5–40)

## 2012-11-29 LAB — CK TOTAL AND CKMB (NOT AT ARMC)
CK, Total: 84 U/L (ref 21–215)
CK-MB: 1.8 ng/mL (ref 0.5–3.6)
CK-MB: 3.4 ng/mL (ref 0.5–3.6)

## 2012-11-30 LAB — COMPREHENSIVE METABOLIC PANEL
Albumin: 3 g/dL — ABNORMAL LOW (ref 3.4–5.0)
Alkaline Phosphatase: 125 U/L (ref 50–136)
BUN: 51 mg/dL — ABNORMAL HIGH (ref 7–18)
Bilirubin,Total: 0.3 mg/dL (ref 0.2–1.0)
Co2: 26 mmol/L (ref 21–32)
Creatinine: 5.85 mg/dL — ABNORMAL HIGH (ref 0.60–1.30)
EGFR (African American): 8 — ABNORMAL LOW
EGFR (Non-African Amer.): 7 — ABNORMAL LOW
Glucose: 114 mg/dL — ABNORMAL HIGH (ref 65–99)
Osmolality: 294 (ref 275–301)
Potassium: 3.8 mmol/L (ref 3.5–5.1)
SGOT(AST): 21 U/L (ref 15–37)
Total Protein: 6.9 g/dL (ref 6.4–8.2)

## 2012-11-30 LAB — CBC WITH DIFFERENTIAL/PLATELET
Eosinophil #: 0.1 10*3/uL (ref 0.0–0.7)
HCT: 30.7 % — ABNORMAL LOW (ref 35.0–47.0)
Lymphocyte #: 2.2 10*3/uL (ref 1.0–3.6)
Lymphocyte %: 21.9 %
MCH: 35 pg — ABNORMAL HIGH (ref 26.0–34.0)
MCV: 105 fL — ABNORMAL HIGH (ref 80–100)
Neutrophil %: 67.1 %
RBC: 2.94 10*6/uL — ABNORMAL LOW (ref 3.80–5.20)
RDW: 14.3 % (ref 11.5–14.5)
WBC: 10.2 10*3/uL (ref 3.6–11.0)

## 2012-12-15 ENCOUNTER — Encounter: Payer: Medicare Other | Admitting: Internal Medicine

## 2013-01-26 ENCOUNTER — Encounter: Payer: Medicare Other | Admitting: Internal Medicine

## 2013-02-13 ENCOUNTER — Other Ambulatory Visit: Payer: Self-pay | Admitting: *Deleted

## 2013-02-13 MED ORDER — FREESTYLE LANCETS MISC: Status: DC

## 2013-02-23 ENCOUNTER — Other Ambulatory Visit: Payer: Self-pay | Admitting: *Deleted

## 2013-02-23 MED ORDER — PRAVASTATIN SODIUM 80 MG PO TABS: 80.0000 mg | ORAL_TABLET | Freq: Every day | ORAL | Status: DC

## 2013-03-25 ENCOUNTER — Emergency Department: Payer: Self-pay | Admitting: Emergency Medicine

## 2013-03-25 ENCOUNTER — Other Ambulatory Visit: Payer: Self-pay | Admitting: *Deleted

## 2013-03-25 LAB — URINALYSIS, COMPLETE
Bilirubin,UR: NEGATIVE
Glucose,UR: 50 mg/dL (ref 0–75)
Ketone: NEGATIVE
Nitrite: NEGATIVE
Protein: 100
RBC,UR: 2 /HPF (ref 0–5)
Specific Gravity: 1.012 (ref 1.003–1.030)
Squamous Epithelial: 5
WBC UR: 104 /HPF (ref 0–5)

## 2013-03-25 MED ORDER — GLUCOSE BLOOD VI STRP: ORAL_STRIP | Status: DC

## 2013-04-10 ENCOUNTER — Other Ambulatory Visit: Payer: Self-pay | Admitting: *Deleted

## 2013-04-10 MED ORDER — FREESTYLE LANCETS MISC: Status: DC

## 2013-05-04 ENCOUNTER — Other Ambulatory Visit: Payer: Self-pay | Admitting: *Deleted

## 2013-05-28 ENCOUNTER — Other Ambulatory Visit: Payer: Self-pay | Admitting: *Deleted

## 2013-05-28 MED ORDER — INSULIN GLARGINE 100 UNIT/ML ~~LOC~~ SOLN: 80.0000 [IU] | Freq: Every day | SUBCUTANEOUS | Status: DC

## 2013-05-28 MED ORDER — FREESTYLE LANCETS MISC: Status: DC

## 2013-06-08 ENCOUNTER — Other Ambulatory Visit: Payer: Self-pay | Admitting: *Deleted

## 2013-06-08 MED ORDER — "INSULIN SYRINGE-NEEDLE U-100 31G X 5/16"" 0.5 ML MISC": Status: DC

## 2013-06-08 MED ORDER — PRAVASTATIN SODIUM 80 MG PO TABS: 80.0000 mg | ORAL_TABLET | Freq: Every day | ORAL | Status: DC

## 2013-08-18 ENCOUNTER — Ambulatory Visit: Payer: Self-pay | Admitting: Internal Medicine

## 2013-10-20 ENCOUNTER — Emergency Department: Payer: Self-pay | Admitting: Emergency Medicine

## 2013-10-20 LAB — CBC WITH DIFFERENTIAL/PLATELET
Basophil %: 0.6 %
Eosinophil #: 0.2 10*3/uL (ref 0.0–0.7)
Lymphocyte #: 2 10*3/uL (ref 1.0–3.6)
Lymphocyte %: 15.9 %
MCH: 35.4 pg — ABNORMAL HIGH (ref 26.0–34.0)
Neutrophil %: 74.3 %
Platelet: 207 10*3/uL (ref 150–440)
RBC: 3.14 10*6/uL — ABNORMAL LOW (ref 3.80–5.20)
RDW: 14.3 % (ref 11.5–14.5)

## 2013-10-20 LAB — URINALYSIS, COMPLETE
Leukocyte Esterase: NEGATIVE
Protein: 100
RBC,UR: 3 /HPF (ref 0–5)

## 2013-10-20 LAB — COMPREHENSIVE METABOLIC PANEL
Albumin: 3.2 g/dL — ABNORMAL LOW (ref 3.4–5.0)
BUN: 42 mg/dL — ABNORMAL HIGH (ref 7–18)
Bilirubin,Total: 0.3 mg/dL (ref 0.2–1.0)
Calcium, Total: 9.2 mg/dL (ref 8.5–10.1)
EGFR (Non-African Amer.): 5 — ABNORMAL LOW
Osmolality: 294 (ref 275–301)
Potassium: 3.3 mmol/L — ABNORMAL LOW (ref 3.5–5.1)
SGPT (ALT): 33 U/L (ref 12–78)
Sodium: 142 mmol/L (ref 136–145)

## 2013-10-20 LAB — TROPONIN I
Troponin-I: <0.02 ng/mL
Troponin-I: 0.02 ng/mL

## 2013-10-20 LAB — RAPID INFLUENZA A&B ANTIGENS

## 2013-10-30 ENCOUNTER — Emergency Department: Payer: Self-pay | Admitting: Emergency Medicine

## 2013-10-30 LAB — LIPASE, BLOOD: Lipase: 79 U/L (ref 73–393)

## 2013-10-30 LAB — CBC WITH DIFFERENTIAL/PLATELET
Basophil #: 0 10*3/uL (ref 0.0–0.1)
Basophil %: 0.4 %
Eosinophil #: 0.2 10*3/uL (ref 0.0–0.7)
HCT: 33.8 % — ABNORMAL LOW (ref 35.0–47.0)
Lymphocyte #: 2.3 10*3/uL (ref 1.0–3.6)
Lymphocyte %: 26.7 %
MCH: 36.4 pg — ABNORMAL HIGH (ref 26.0–34.0)
MCHC: 34.3 g/dL (ref 32.0–36.0)
MCV: 106 fL — ABNORMAL HIGH (ref 80–100)
Monocyte #: 1.1 x10 3/mm — ABNORMAL HIGH (ref 0.2–0.9)
Monocyte %: 13 %
Neutrophil #: 5 10*3/uL (ref 1.4–6.5)
Platelet: 175 10*3/uL (ref 150–440)
RBC: 3.18 10*6/uL — ABNORMAL LOW (ref 3.80–5.20)
RDW: 14.4 % (ref 11.5–14.5)
WBC: 8.7 10*3/uL (ref 3.6–11.0)

## 2013-10-30 LAB — COMPREHENSIVE METABOLIC PANEL
Albumin: 2.9 g/dL — ABNORMAL LOW (ref 3.4–5.0)
Alkaline Phosphatase: 96 U/L
Anion Gap: 7 (ref 7–16)
BUN: 44 mg/dL — ABNORMAL HIGH (ref 7–18)
Bilirubin,Total: 0.3 mg/dL (ref 0.2–1.0)
Calcium, Total: 7.9 mg/dL — ABNORMAL LOW (ref 8.5–10.1)
Co2: 27 mmol/L (ref 21–32)
Creatinine: 7.15 mg/dL — ABNORMAL HIGH (ref 0.60–1.30)
EGFR (African American): 6 — ABNORMAL LOW
Glucose: 176 mg/dL — ABNORMAL HIGH (ref 65–99)
Osmolality: 293 (ref 275–301)
SGOT(AST): 35 U/L (ref 15–37)
SGPT (ALT): 32 U/L (ref 12–78)
Sodium: 139 mmol/L (ref 136–145)

## 2013-10-30 LAB — URINALYSIS, COMPLETE
Bilirubin,UR: NEGATIVE
Glucose,UR: 50 mg/dL (ref 0–75)
Granular Cast: 1
Ketone: NEGATIVE
Leukocyte Esterase: NEGATIVE
Nitrite: NEGATIVE
Ph: 5 (ref 4.5–8.0)
Protein: 100
RBC,UR: 3 /HPF (ref 0–5)
Specific Gravity: 1.013 (ref 1.003–1.030)

## 2014-01-19 ENCOUNTER — Ambulatory Visit: Payer: Self-pay | Admitting: Gastroenterology

## 2014-01-27 ENCOUNTER — Other Ambulatory Visit: Payer: Self-pay | Admitting: *Deleted

## 2014-01-27 MED ORDER — PRAVASTATIN SODIUM 80 MG PO TABS: 80.0000 mg | ORAL_TABLET | Freq: Every day | ORAL | Status: DC

## 2014-04-29 ENCOUNTER — Other Ambulatory Visit: Payer: Self-pay | Admitting: *Deleted

## 2014-04-29 MED ORDER — "INSULIN SYRINGE-NEEDLE U-100 31G X 5/16"" 0.5 ML MISC": Status: DC

## 2014-08-11 ENCOUNTER — Emergency Department: Payer: Self-pay | Admitting: Emergency Medicine

## 2014-08-12 ENCOUNTER — Emergency Department: Payer: Self-pay | Admitting: Emergency Medicine

## 2014-08-12 LAB — CBC WITH DIFFERENTIAL/PLATELET
BASOS ABS: 0 10*3/uL (ref 0.0–0.1)
BASOS PCT: 0.3 %
EOS ABS: 0.3 10*3/uL (ref 0.0–0.7)
EOS PCT: 2.7 %
HCT: 34.4 % — ABNORMAL LOW (ref 35.0–47.0)
HGB: 11.4 g/dL — AB (ref 12.0–16.0)
Lymphocyte #: 1.9 10*3/uL (ref 1.0–3.6)
Lymphocyte %: 18.4 %
MCH: 36.1 pg — ABNORMAL HIGH (ref 26.0–34.0)
MCHC: 33.1 g/dL (ref 32.0–36.0)
MCV: 109 fL — ABNORMAL HIGH (ref 80–100)
Monocyte #: 0.9 x10 3/mm (ref 0.2–0.9)
Monocyte %: 8.7 %
NEUTROS ABS: 7.1 10*3/uL — AB (ref 1.4–6.5)
NEUTROS PCT: 69.9 %
Platelet: 199 10*3/uL (ref 150–440)
RBC: 3.15 10*6/uL — ABNORMAL LOW (ref 3.80–5.20)
RDW: 14.5 % (ref 11.5–14.5)
WBC: 10.1 10*3/uL (ref 3.6–11.0)

## 2014-08-12 LAB — COMPREHENSIVE METABOLIC PANEL
ALBUMIN: 3.4 g/dL (ref 3.4–5.0)
ANION GAP: 11 (ref 7–16)
Alkaline Phosphatase: 145 U/L — ABNORMAL HIGH
BILIRUBIN TOTAL: 0.2 mg/dL (ref 0.2–1.0)
BUN: 65 mg/dL — AB (ref 7–18)
CHLORIDE: 100 mmol/L (ref 98–107)
CREATININE: 9.02 mg/dL — AB (ref 0.60–1.30)
Calcium, Total: 8.9 mg/dL (ref 8.5–10.1)
Co2: 28 mmol/L (ref 21–32)
EGFR (Non-African Amer.): 5 — ABNORMAL LOW
GFR CALC AF AMER: 6 — AB
Glucose: 173 mg/dL — ABNORMAL HIGH (ref 65–99)
OSMOLALITY: 300 (ref 275–301)
Potassium: 4.3 mmol/L (ref 3.5–5.1)
SGOT(AST): 30 U/L (ref 15–37)
SGPT (ALT): 32 U/L
SODIUM: 139 mmol/L (ref 136–145)
Total Protein: 7.6 g/dL (ref 6.4–8.2)

## 2014-08-12 LAB — URINALYSIS, COMPLETE
BACTERIA: NONE SEEN
Bilirubin,UR: NEGATIVE
Glucose,UR: 50 mg/dL (ref 0–75)
KETONE: NEGATIVE
Leukocyte Esterase: NEGATIVE
Nitrite: NEGATIVE
PH: 7 (ref 4.5–8.0)
SPECIFIC GRAVITY: 1.009 (ref 1.003–1.030)
WBC UR: <1 /HPF (ref 0–5)

## 2014-08-12 LAB — LIPASE, BLOOD: Lipase: 98 U/L (ref 73–393)

## 2014-08-12 LAB — TROPONIN I

## 2014-10-27 ENCOUNTER — Emergency Department: Payer: Self-pay | Admitting: Emergency Medicine

## 2014-10-27 LAB — COMPREHENSIVE METABOLIC PANEL
ALK PHOS: 133 U/L — AB
ANION GAP: 9 (ref 7–16)
AST: 16 U/L (ref 15–37)
Albumin: 2.9 g/dL — ABNORMAL LOW (ref 3.4–5.0)
BUN: 44 mg/dL — ABNORMAL HIGH (ref 7–18)
Bilirubin,Total: 0.3 mg/dL (ref 0.2–1.0)
CHLORIDE: 104 mmol/L (ref 98–107)
CREATININE: 7.75 mg/dL — AB (ref 0.60–1.30)
Calcium, Total: 7.7 mg/dL — ABNORMAL LOW (ref 8.5–10.1)
Co2: 29 mmol/L (ref 21–32)
EGFR (Non-African Amer.): 5 — ABNORMAL LOW
GFR CALC AF AMER: 7 — AB
GLUCOSE: 108 mg/dL — AB (ref 65–99)
Osmolality: 295 (ref 275–301)
POTASSIUM: 3.2 mmol/L — AB (ref 3.5–5.1)
SGPT (ALT): 28 U/L
Sodium: 142 mmol/L (ref 136–145)
TOTAL PROTEIN: 7 g/dL (ref 6.4–8.2)

## 2014-10-27 LAB — CBC
HCT: 30 % — AB (ref 35.0–47.0)
HGB: 9.7 g/dL — AB (ref 12.0–16.0)
MCH: 35.5 pg — AB (ref 26.0–34.0)
MCHC: 32.5 g/dL (ref 32.0–36.0)
MCV: 109 fL — AB (ref 80–100)
Platelet: 209 10*3/uL (ref 150–440)
RBC: 2.74 10*6/uL — ABNORMAL LOW (ref 3.80–5.20)
RDW: 14.3 % (ref 11.5–14.5)
WBC: 13.2 10*3/uL — ABNORMAL HIGH (ref 3.6–11.0)

## 2014-10-27 LAB — TROPONIN I
Troponin-I: 0.03 ng/mL
Troponin-I: 0.03 ng/mL

## 2015-02-25 NOTE — Discharge Summary (Signed)
PATIENT NAME:  ALIDIA, BARDER MR#:  [id number] DATE OF BIRTH:  01/31/1941  DATE OF ADMISSION:  11/28/2012 DATE OF DISCHARGE:  11/30/2012  TYPE OF DISCHARGE: The patient is discharged to home.   REASON FOR ADMISSION: Chest pain.   HISTORY OF PRESENT ILLNESS: The patient is a 74 year old female with a history of end-stage renal disease, on peritoneal dialysis, as well as diabetes and hyperlipidemia who presented with chest pain. EKG was nonspecific, and she was admitted for further evaluation.   PAST MEDICAL HISTORY: 1. End-stage renal disease, on peritoneal dialysis.  2. Gout.  3. Chronic back pain hyperlipidemia.  4. Diabetes, on insulin.  5. Status post appendectomy.  6. Status post hysterectomy.  7. Status post cholecystectomy.   MEDICATIONS ON ADMISSION: Please see admission note.   ALLERGIES: No known drug allergies.   SOCIAL HISTORY: Negative for alcohol or tobacco abuse.   FAMILY HISTORY: Hypertension.   REVIEW OF SYSTEMS: As per HPI.   PHYSICAL EXAMINATION: GENERAL: The patient was in no acute distress.  VITAL SIGNS: Vital signs were remarkable for a blood pressure of 205/95 with a heart rate of 85. She was afebrile.  HEENT: Exam was unremarkable.  NECK: Supple without JVD.  LUNGS: Clear.  CARDIAC: Regular rate and rhythm with normal S1 and S2.  ABDOMEN: Soft and nontender.  EXTREMITIES: Without edema.  NEUROLOGICAL:  Exam was grossly nonfocal.   HOSPITAL COURSE: The patient was admitted with chest pain. Serial enzymes were drawn. Her troponin peaked at 0.76. She underwent stress Myoview which revealed a fixed defect, per Dr. Clayborn Bigness, with no evidence of ischemia. She remained asymptomatic in the hospital with no evidence of chest pain. Outpatient cardiology follow-up was recommended by Dr. Clayborn Bigness. The patient wanted to go home on Sunday and was in no acute distress. She was subsequently discharged home for further outpatient evaluation.   DISCHARGE  DIAGNOSES: 1. Chest pain, presumably cardiac in nature.  2. Abnormal Myoview with no evidence of ischemia.  3. Accelerated hypertension.  4. End-stage renal disease, on peritoneal dialysis.  5. Hyperlipidemia.  6. Diabetes, on insulin.   DISCHARGE MEDICATIONS: 1. Amlodipine 5 mg p.o. daily.  2. Aspirin 81 mg p.o. daily.  3. Rocaltrol 0.25 mcg p.o. daily.  4. Sensipar 30 mg p.o. daily.  5. Clonidine 0.3 mg p.o. b.i.d.  6. Hydralazine 50 mg p.o. t.i.d.  7. Lantus 70 units subcutaneous at bedtime.  8. Humalog 45 units subcutaneous at bedtime.  9. Pantoprazole 40 mg p.o. daily.  10. Pravastatin 80 mg p.o. at bedtime.  11. Hytrin 5 mg p.o. at bedtime.  12. Cozaar 50 mg p.o. daily.  13. Sevelamer 800 mg p.o. t.i.d.  14. Imdur 30 mg p.o. b.i.d.  15. Nitrostat p.r.n. chest pain.  16. Zofran 4 mg p.o. q. 4 hours p.r.n. nausea and vomiting.  FOLLOW-UP PLANS AND APPOINTMENTS: The patient was discharged on a renal diet. She will continue peritoneal dialysis. She will follow up with Dr. Ginette Pitman within 24 to 48 hours. She is to return to the Emergency Room if her chest pain should recur.   ____________________________ Leonie Douglas. Doy Hutching, MD jds:cb D: 11/30/2012 08:19:19 ET T: 11/30/2012 08:50:09 ET JOB#: [id number]  cc: Leonie Douglas. Doy Hutching, MD, <Dictator> JEFFREY Lennice Sites MD ELECTRONICALLY SIGNED 11/30/2012 10:24

## 2015-02-25 NOTE — H&P (Signed)
PATIENT NAME:  Laurie Steele, Laurie Steele MR#:  [id number] DATE OF BIRTH:  June 11, 1941  DATE OF ADMISSION:  11/28/2012  PRIMARY CARE PHYSICIAN:  Dr. Doy Hutching.   CHIEF COMPLAINT: Chest pain.   HISTORY OF PRESENT ILLNESS:  A 74 year old female presents with chest pain. The patient said that her chest pain started last night while she was laying in bed. It is worse when laying in bed. No other relieving or aggravating factors. She says it is a pressure-like pain that is located substernally, does not radiate anywhere. It is not associated with any shortness of breath, nausea or vomiting. It lasted actually all afternoon, and so she came here for further evaluation. Right now, she is not complaining of any pressure or pain. In the ER, she received some aspirin. EKG does not show any acute ST changes.    REVIEW OF SYSTEMS:   CONSTITUTIONAL:  No fever, fatigue, weakness.  EYES:  No blurred or double vision.  EAR, NOSE, THROAT:  No hearing loss or seasonal allergies.  RESPIRATORY:  No cough, wheezing, hemoptysis or COPD.  CARDIOVASCULAR:  Chest pain/pressure as mentioned above. No palpitations, orthopnea, edema, arrhythmia, dyspnea on exertion or syncope.  GASTROINTESTINAL:  No nausea, vomiting, diarrhea or abdominal pain.  GENITOURINARY: No dysuria or hematuria. ENDOCRINE:  No polyuria or polydipsia. She is on peritoneal dialysis.  HEMATOLOGIC:  Positive anemia of chronic disease.  No easy bruising. SKIN:  No rash or lesions. MUSCULOSKELETAL:  No limited activity.    NEUROLOGIC:  No history of CVA or TIA. PSYCHIATRIC:  No history of anxiety or depression.   PAST MEDICAL HISTORY:   1.  Gout. 2.  Peritoneal dialysis with end-stage renal disease.  3.  Chronic back pain.  4.  Hyperlipidemia.  5.  Diabetes.    6.  Hypertension  PAST SURGICAL HISTORY: 1.  Appendectomy.  2.  Cholecystectomy.  3.  Back surgery.  4.  Hysterectomy.  MEDICATIONS: 1.  Trazadone 4m at bedtime.  2.  Sensipar 30 mg daily.   3.  Renvela 800 mg t.i.d.  4.  Pravastatin 80 mg at bedtime.  5.  Zofran 4 mg q.8 hours p.r.n.  6.  Lantus 70 units at bedtime.  7.  Hydralazine 50 mg t.i.d.  8.  Humalog 20 units in the a.m.  9.  Humalog 45 units at bedtime.  10.  Glipizide 10 mg daily. 11.  Clonidine 0.3 mg b.i.d.  12.  Calcitriol 0.25 mcg daily.  13.  Norvasc 5 mg daily.  14.  Allopurinol 100 mg daily.   ALLERGIES:  No known drug allergies.   FAMILY HISTORY:  Hypertension.   SOCIAL HISTORY:  The patient denies any tobacco, alcohol or drug use.   PHYSICAL EXAMINATION: VITAL SIGNS:  Temperature 98.2, pulse is 85, respirations 20, blood pressure 205/95, and 99% on room air.  GENERAL: The patient is alert and oriented, not in acute distress.  HEENT:  Head is atraumatic. Pupils are round and reactive. Sclerae injected. Mucous membranes  are  moist.  Oropharynx is clear. NECK:  Supple without JVD, carotid bruit or enlarged thyroid.  CARDIOVASCULAR:  Regular rate and rhythm. No murmurs, gallops or rubs. PMI is not displaced.  LUNGS:  Clear to auscultation without crackles, rales, rhonchi or wheezing.  ABDOMEN: Obese. Bowel sounds are positive. Nontender. Hard to appreciate organomegaly. She does appear have peritoneal dialysis catheter.  EXTREMITIES:  No clubbing, cyanosis or edema.  NEUROLOGIC:  Cranial nerves II through XII are intact. No focal deficits.  SKIN:  Without rash or lesions.   LABORATORY DATA: White blood cells 8.7, hemoglobin 11.1, hematocrit 33, platelets are 213. Sodium 142, potassium 4.0, chloride 107, bicarb 25, BUN 54, creatinine 5.67, glucose 234. Troponin 0.02, CK 69, CK-MB 1.4. Bilirubin 0.2, alk phos 191, ALT 40, AST 26, total protein 7.3, albumin 3.3.  Magnesium 1.9, phosphorus 4.2.  EKG shows no acute ST elevation or depression with normal sinus rhythm.   ASSESSMENT AND PLAN:  A 74 year old female with history of end-stage renal disease on peritoneal with diabetes, who presents with chest  pain.  1.  Chest pain. The patient will be admitted to telemetry. We will continue to cycle her cardiac enzymes. We will order a Myoview. If the troponins are negative, she may proceed with Myoview in the a.m.   I have written for some nitro and aspirin. We will check fasting lipids.  2.  Accelerated hypertension. We will reset the patient's outpatient medications.  3.  End-stage renal disease on peritoneal dialysis.  I have spoken with Dr. Holley Raring, and he says the patient does not need to have dialysis tonight, but if she stays another night, then she will need dialysis.  4.  Diabetes. We will continue her on outpatient medications, ADA diet and Sliding Scale Insulin  5.  Anemia of chronic disease: monitor  CODE STATUS: The patient is FULL CODE STATUS .   TIME SPENT: 35 and 32.    ____________________________ Pharrell Ledford P. Benjie Karvonen, MD spm:dm D: 11/28/2012 20:50:00 ET T: 11/28/2012 22:30:02 ET JOB#: [id number]  cc: Neng Albee P. Benjie Karvonen, MD, <Dictator> Harrell Niehoff P Sidnie Swalley MD ELECTRONICALLY SIGNED 11/30/2012 22:06

## 2015-07-13 ENCOUNTER — Emergency Department
Admission: EM | Admit: 2015-07-13 | Discharge: 2015-07-13 | Disposition: A | Discharge status: Home / Self Care | Payer: Medicare Other | Attending: Emergency Medicine | Admitting: Emergency Medicine

## 2015-07-13 ENCOUNTER — Emergency Department: Payer: Medicare Other

## 2015-07-13 ENCOUNTER — Encounter: Payer: Self-pay | Admitting: Emergency Medicine

## 2015-07-13 DIAGNOSIS — R0789 Other chest pain: Secondary | ICD-10-CM | POA: Insufficient documentation

## 2015-07-13 DIAGNOSIS — I10 Essential (primary) hypertension: Secondary | ICD-10-CM | POA: Insufficient documentation

## 2015-07-13 DIAGNOSIS — Z87891 Personal history of nicotine dependence: Secondary | ICD-10-CM | POA: Insufficient documentation

## 2015-07-13 DIAGNOSIS — Z79899 Other long term (current) drug therapy: Secondary | ICD-10-CM | POA: Diagnosis not present

## 2015-07-13 DIAGNOSIS — Z7982 Long term (current) use of aspirin: Secondary | ICD-10-CM | POA: Diagnosis not present

## 2015-07-13 DIAGNOSIS — E119 Type 2 diabetes mellitus without complications: Secondary | ICD-10-CM | POA: Diagnosis not present

## 2015-07-13 DIAGNOSIS — Z794 Long term (current) use of insulin: Secondary | ICD-10-CM | POA: Insufficient documentation

## 2015-07-13 DIAGNOSIS — Z7951 Long term (current) use of inhaled steroids: Secondary | ICD-10-CM | POA: Insufficient documentation

## 2015-07-13 DIAGNOSIS — R112 Nausea with vomiting, unspecified: Secondary | ICD-10-CM | POA: Insufficient documentation

## 2015-07-13 DIAGNOSIS — R079 Chest pain, unspecified: Secondary | ICD-10-CM | POA: Diagnosis present

## 2015-07-13 LAB — COMPREHENSIVE METABOLIC PANEL
ALBUMIN: 2.9 g/dL — AB (ref 3.5–5.0)
ALK PHOS: 132 U/L — AB (ref 38–126)
ALT: 18 U/L (ref 14–54)
AST: 29 U/L (ref 15–41)
Anion gap: 14 (ref 5–15)
BILIRUBIN TOTAL: 0.1 mg/dL — AB (ref 0.3–1.2)
BUN: 55 mg/dL — AB (ref 6–20)
CALCIUM: 8.4 mg/dL — AB (ref 8.9–10.3)
CO2: 25 mmol/L (ref 22–32)
CREATININE: 8.83 mg/dL — AB (ref 0.44–1.00)
Chloride: 102 mmol/L (ref 101–111)
GFR calc Af Amer: 4 mL/min — ABNORMAL LOW (ref >60)
GFR calc non Af Amer: 4 mL/min — ABNORMAL LOW (ref >60)
GLUCOSE: 216 mg/dL — AB (ref 65–99)
Potassium: 3.5 mmol/L (ref 3.5–5.1)
SODIUM: 141 mmol/L (ref 135–145)
Total Protein: 6.3 g/dL — ABNORMAL LOW (ref 6.5–8.1)

## 2015-07-13 LAB — CBC
HEMATOCRIT: 27.7 % — AB (ref 35.0–47.0)
HEMOGLOBIN: 9.5 g/dL — AB (ref 12.0–16.0)
MCH: 37.5 pg — ABNORMAL HIGH (ref 26.0–34.0)
MCHC: 34.3 g/dL (ref 32.0–36.0)
MCV: 109.5 fL — AB (ref 80.0–100.0)
Platelets: 180 10*3/uL (ref 150–440)
RBC: 2.53 MIL/uL — ABNORMAL LOW (ref 3.80–5.20)
RDW: 16.2 % — ABNORMAL HIGH (ref 11.5–14.5)
WBC: 10.3 10*3/uL (ref 3.6–11.0)

## 2015-07-13 LAB — TROPONIN I: Troponin I: <0.03 ng/mL (ref <0.031)

## 2015-07-13 MED ORDER — HYDRALAZINE HCL 25 MG PO TABS
50.0000 mg | ORAL_TABLET | Freq: Once | ORAL | Status: AC
  Administered 2015-07-13: 50 mg via ORAL
  Filled 2015-07-13: qty 2

## 2015-07-13 MED ORDER — CLONIDINE HCL 0.1 MG PO TABS
0.3000 mg | ORAL_TABLET | Freq: Once | ORAL | Status: AC
  Administered 2015-07-13: 0.3 mg via ORAL
  Filled 2015-07-13: qty 3

## 2015-07-13 MED ORDER — ONDANSETRON HCL 4 MG/2ML IJ SOLN
4.0000 mg | Freq: Once | INTRAMUSCULAR | Status: AC
  Administered 2015-07-13: 4 mg via INTRAVENOUS
  Filled 2015-07-13: qty 2

## 2015-07-13 MED ORDER — LOSARTAN POTASSIUM 25 MG PO TABS
50.0000 mg | ORAL_TABLET | Freq: Once | ORAL | Status: AC
  Administered 2015-07-13: 50 mg via ORAL
  Filled 2015-07-13: qty 2

## 2015-07-13 NOTE — ED Notes (Addendum)
Pt presents to ED via EMS with c/o center chest pain, describes as heaviness associated with SOB on exertion, onset x2 days, worse today. 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 Pt taken 325mg  ASA and EMS given sublingual nitro x1.

## 2015-07-13 NOTE — ED Provider Notes (Signed)
[facility] Emergency Department Provider Note  ____________________________________________  Time seen: 1915 p.m.   I have reviewed the triage vital signs and the nursing notes.   HISTORY  Chief Complaint Chest Pain  chest pressure    HPI Laurie Steele is a 74 y.o. female who has a history of hypertension, diabetes, and renal failure, and he uses peritoneal dialysis, presents the emergency department with chest pressure for the past 2 days.  She reports that she has been told she had a heart attack at some point, but it was not a notable event when it happened. It is unclear what this prior diagnosis was based on, but he suspected it was simple based on EKG changes that most of the noted at some point.  Regardless, she may have a history of cardiac disease. She does see Dr. Ubaldo Glassing, cardiologist.  She denies any sharp chest pain or radiating pain. She denies any nausea or vomiting. She has felt warm and flushed at times, but without diaphoresis.  The patient did come in by EMS and was given sublingual nitroglycerin. She reports that her heaviness eased after that.  The patient has taken her regular medicines earlier today, but she has not taken her evening medications. She is on multiple antihypertensives.   Past Medical History  Diagnosis Date  . Hx of colonic polyps   . Hyperlipidemia   . Hypertension   . Renal failure     Dr Holley Raring  . 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 Chronic anemia     Dr Inez Pilgrim  . Glaucoma   . GERD (gastroesophageal reflux disease)   . Gout   . Miscarriage   . Vaginal delivery     x 4    Patient Active Problem List   Diagnosis Date Noted  . Bronchitis 03/03/2012  . Type II or unspecified type diabetes mellitus with renal manifestations, not stated as uncontrolled  12/18/2011  . END STAGE RENAL DISEASE 10/23/2010  . NEUROPATHY 12/13/2009  . GOUT 02/15/2009  . SLEEP DISORDER 02/15/2009  . GERD 02/23/2008  . HYPERLIPIDEMIA 05/14/2007  . ANEMIA, CHRONIC 05/14/2007  . HYPERTENSION 05/14/2007    Past Surgical History  Procedure Laterality Date  . Cholecystectomy    . Abdominal hysterectomy  1983  . Back surgery  6/08    Dr Collier Salina  . Back surgery  1987    disc    Current Outpatient Rx  Name  Route  Sig  Dispense  Refill  . amLODipine (NORVASC) 5 MG tablet   Oral   Take 5 mg by mouth daily.           Marland Kitchen aspirin EC 325 MG tablet   Oral   Take 325 mg by mouth daily.         . B Complex-C-Folic Acid (DIALYVITE Q000111Q PO)   Oral   Take 1 tablet by mouth daily.         . calcitRIOL (ROCALTROL) 0.5 MCG capsule   Oral   Take 0.5 mcg by mouth daily.           . cinacalcet (SENSIPAR) 90 MG tablet   Oral   Take 90 mg by mouth daily.         . cloNIDine (CATAPRES) 0.3 MG tablet   Oral  Take 1 tablet (0.3 mg total) by mouth 2 (two) times daily.   60 tablet   11   . fluticasone (FLONASE) 50 MCG/ACT nasal spray   Each Nare   Place 2 sprays into both nostrils daily.         . hydrALAZINE (APRESOLINE) 50 MG tablet   Oral   Take 50 mg by mouth 3 (three) times daily.           . insulin aspart (NOVOLOG) 100 UNIT/ML injection   Subcutaneous   Inject 20-48 Units into the skin 2 (two) times daily. Pt uses 20 units in the morning and 48 units in the evening.         . insulin glargine (LANTUS) 100 UNIT/ML injection   Subcutaneous   Inject 0.8 mLs (80 Units total) into the skin at bedtime. Patient taking differently: Inject 70 Units into the skin at bedtime.    7 vial   3   . ipratropium (ATROVENT) 0.03 % nasal spray   Each Nare   Place 1 spray into both nostrils 3 (three) times daily as needed for rhinitis.         Marland Kitchen isosorbide mononitrate (IMDUR) 30 MG 24 hr tablet   Oral   Take 30 mg by mouth 2 (two) times  daily.         Marland Kitchen latanoprost (XALATAN) 0.005 % ophthalmic solution   Both Eyes   Place 1 drop into both eyes at bedtime.         Marland Kitchen losartan (COZAAR) 50 MG tablet   Oral   Take 50 mg by mouth at bedtime.          . multivitamin (RENA-VIT) TABS tablet   Oral   Take 1 tablet by mouth daily.           . ondansetron (ZOFRAN) 8 MG tablet   Oral   Take 8 mg by mouth every 8 (eight) hours as needed for nausea or vomiting.         . pantoprazole (PROTONIX) 40 MG tablet   Oral   Take 40 mg by mouth daily.         . pravastatin (PRAVACHOL) 80 MG tablet   Oral   Take 1 tablet (80 mg total) by mouth daily.   90 tablet   3   . sevelamer (RENVELA) 800 MG tablet   Oral   Take 800 mg by mouth 3 (three) times daily with meals.           . timolol (BETIMOL) 0.5 % ophthalmic solution   Both Eyes   Place 1 drop into both eyes 2 (two) times daily.           Allergies Review of patient's allergies indicates no known allergies.  Family History  Problem Relation Age of Onset  . Heart attack Father   . Hypertension Mother   . Hyperlipidemia Mother   . Coronary artery disease      strong fam hx    Social History Social History  Substance Use Topics  . Smoking status: Former Smoker    Quit date: 11/05/1990  . Smokeless tobacco: Never Used  . Alcohol Use: No    Review of Systems  Constitutional: Negative for fever. ENT: Negative for sore throat. Cardiovascular: Chest pressure, see history of present illness Respiratory: Negative for shortness of breath. Gastrointestinal: Negative for abdominal pain, vomiting and diarrhea. Genitourinary: Negative for dysuria. Musculoskeletal: No myalgias or injuries. Skin: Negative for rash.  Neurological: Negative for headaches   10-point ROS otherwise negative.  ____________________________________________   PHYSICAL EXAM:  VITAL SIGNS: ED Triage Vitals  Enc Vitals Group     BP 07/13/15 1859 176/67 mmHg     Pulse  Rate 07/13/15 1859 90     Resp 07/13/15 1859 20     Temp 07/13/15 1859 98.4 F (36.9 C)     Temp Source 07/13/15 1859 Oral     SpO2 07/13/15 1859 98 %     Weight 07/13/15 1903 213 lb (96.616 kg)     Height 07/13/15 1903 5' 7.5" (1.715 m)     Head Cir --      Peak Flow --      Pain Score 07/13/15 1900 3     Pain Loc --      Pain Edu? --      Excl. in [place]? --     Constitutional: Alert and oriented. Appears a little uncomfortable but in no acute distress.Marland Kitchen ENT   Head: Normocephalic and atraumatic.   Nose: No congestion/rhinnorhea. Cardiovascular: Normal rate of 90 but a little irregular, no murmur noted Respiratory:  Normal respiratory effort, no tachypnea.    Breath sounds are clear and equal bilaterally.  Gastrointestinal: Soft and nontender. No distention.  Back: No muscle spasm, no tenderness, no CVA tenderness. Musculoskeletal: No deformity noted. Nontender with normal range of motion in all extremities.  Mild edema noted. Neurologic:  Normal speech and language. No gross focal neurologic deficits are appreciated.  Skin:  Skin is warm, dry. No rash noted. Psychiatric: Patient is alert and communicative. She has a somewhat flat affect but the affect is otherwise normal. Speech and behavior are normal.  ____________________________________________    LABS (pertinent positives/negatives)  Labs Reviewed  CBC - Abnormal; Notable for the following:    RBC 2.53 (*)    Hemoglobin 9.5 (*)    HCT 27.7 (*)    MCV 109.5 (*)    MCH 37.5 (*)    RDW 16.2 (*)    All other components within normal limits  COMPREHENSIVE METABOLIC PANEL - Abnormal; Notable for the following:    Glucose, Bld 216 (*)    BUN 55 (*)    Creatinine, Ser 8.83 (*)    Calcium 8.4 (*)    Total Protein 6.3 (*)    Albumin 2.9 (*)    Alkaline Phosphatase 132 (*)    Total Bilirubin 0.1 (*)    GFR calc non Af Amer 4 (*)    GFR calc Af Amer 4 (*)    All other components within normal limits  TROPONIN I      ____________________________________________   EKG  ED ECG REPORT I, Tiearra Colwell W, the attending physician, personally viewed and interpreted this ECG.   Date: 07/13/2015  EKG Time: 1848  Rate: 90  Rhythm: Sinus rhythm with premature atrial complexes  Axis: Left axis deviation at -39  Intervals: Normal  ST&T Change: None noted   ____________________________________________    RADIOLOGY   IMPRESSION: No acute cardiopulmonary process.  ____________________________________________   INITIAL IMPRESSION / ASSESSMENT AND PLAN / ED COURSE  Pertinent labs & imaging results that were available during my care of the patient were reviewed by me and considered in my medical decision making (see chart for details).  74 year old female with chest pressure. She has diabetes and hypertension as well as renal failure. She has a bit less pressure now than she did earlier, but she still appears a little bit uncomfortable.  Her first troponin level is negative.   The patient has notable hypertension currently. This includes blood pressures that are 204/70. She is on multiple antihypertensives. We will treat her with her evening doses of hydralazine, clonidine, and losartan.  ----------------------------------------- 9:21 PM on 07/13/2015 -----------------------------------------  The patient has had some emesis prior to being given her antihypertensives. She was then able to tolerate the antihypertensives after receiving Zofran. She reports her chest discomfort is now resolved. In addition to the chest discomfort being resolved status post emesis, her blood pressure has eased down to 149/71. The patient feels comfortable and would like to go home at this point. I have asked her to follow-up closely with her primary doctor asked noted clinic.   ____________________________________________   FINAL CLINICAL IMPRESSION(S) / ED DIAGNOSES  Final diagnoses:  Chest pressure   Non-intractable vomiting with nausea, vomiting of unspecified type  Essential hypertension      Ahmed Prima, MD 07/13/15 2148

## 2015-07-13 NOTE — Discharge Instructions (Signed)
Your blood pressure was notably elevated, but eased when she had vomited. Your chest discomfort went away at this time as well. You were treated with your usual evening dose of antihypertensives. If you have other nighttime medicines other than clonidine, losartan, and hydralazine, take those when you get home. Call [facility] clinic tomorrow for a follow-up appointment soon. You should be seen in the next 2 days or early next week. If you have further chest discomfort or any other urgent concerns, return to the emergency department immediately.  Hypertension Hypertension is another name for high blood pressure. High blood pressure forces your heart to work harder to pump blood. A blood pressure reading has two numbers, which includes a higher number over a lower number (example: 110/72). HOME CARE   Have your blood pressure rechecked by your doctor.  Only take medicine as told by your doctor. Follow the directions carefully. The medicine does not work as well if you skip doses. Skipping doses also puts you at risk for problems.  Do not smoke.  Monitor your blood pressure at home as told by your doctor. GET HELP IF:  You think you are having a reaction to the medicine you are taking.  You have repeat headaches or feel dizzy.  You have puffiness (swelling) in your ankles.  You have trouble with your vision. GET HELP RIGHT AWAY IF:   You get a very bad headache and are confused.  You feel weak, numb, or faint.  You get chest or belly (abdominal) pain.  You throw up (vomit).  You cannot breathe very well. MAKE SURE YOU:   Understand these instructions.  Will watch your condition.  Will get help right away if you are not doing well or get worse. Document Released: 04/09/2008 Document Revised: 10/27/2013 Document Reviewed: 08/14/2013 [facility] Patient Information 2015 [place], [place]. This information is not intended to replace advice given to you by your health care provider. Make  sure you discuss any questions you have with your health care provider.

## 2015-07-31 ENCOUNTER — Emergency Department: Payer: Medicare Other

## 2015-07-31 DIAGNOSIS — Z79899 Other long term (current) drug therapy: Secondary | ICD-10-CM | POA: Diagnosis not present

## 2015-07-31 DIAGNOSIS — Z87891 Personal history of nicotine dependence: Secondary | ICD-10-CM | POA: Diagnosis not present

## 2015-07-31 DIAGNOSIS — N186 End stage renal disease: Secondary | ICD-10-CM | POA: Insufficient documentation

## 2015-07-31 DIAGNOSIS — Z7982 Long term (current) use of aspirin: Secondary | ICD-10-CM | POA: Diagnosis not present

## 2015-07-31 DIAGNOSIS — R079 Chest pain, unspecified: Secondary | ICD-10-CM | POA: Insufficient documentation

## 2015-07-31 DIAGNOSIS — R112 Nausea with vomiting, unspecified: Secondary | ICD-10-CM | POA: Insufficient documentation

## 2015-07-31 DIAGNOSIS — E1122 Type 2 diabetes mellitus with diabetic chronic kidney disease: Secondary | ICD-10-CM | POA: Insufficient documentation

## 2015-07-31 DIAGNOSIS — E11319 Type 2 diabetes mellitus with unspecified diabetic retinopathy without macular edema: Secondary | ICD-10-CM | POA: Insufficient documentation

## 2015-07-31 DIAGNOSIS — R0602 Shortness of breath: Secondary | ICD-10-CM | POA: Diagnosis not present

## 2015-07-31 DIAGNOSIS — Z794 Long term (current) use of insulin: Secondary | ICD-10-CM | POA: Diagnosis not present

## 2015-07-31 DIAGNOSIS — I120 Hypertensive chronic kidney disease with stage 5 chronic kidney disease or end stage renal disease: Secondary | ICD-10-CM | POA: Insufficient documentation

## 2015-07-31 LAB — BASIC METABOLIC PANEL
ANION GAP: 13 (ref 5–15)
BUN: 53 mg/dL — ABNORMAL HIGH (ref 6–20)
CO2: 29 mmol/L (ref 22–32)
Calcium: 9.4 mg/dL (ref 8.9–10.3)
Chloride: 103 mmol/L (ref 101–111)
Creatinine, Ser: 9.63 mg/dL — ABNORMAL HIGH (ref 0.44–1.00)
GFR, EST AFRICAN AMERICAN: 4 mL/min — AB (ref >60)
GFR, EST NON AFRICAN AMERICAN: 3 mL/min — AB (ref >60)
GLUCOSE: 62 mg/dL — AB (ref 65–99)
Potassium: 3.4 mmol/L — ABNORMAL LOW (ref 3.5–5.1)
Sodium: 145 mmol/L (ref 135–145)

## 2015-07-31 LAB — TROPONIN I: Troponin I: 0.03 ng/mL (ref <0.031)

## 2015-07-31 LAB — CBC
HEMATOCRIT: 29.3 % — AB (ref 35.0–47.0)
HEMOGLOBIN: 9.9 g/dL — AB (ref 12.0–16.0)
MCH: 36.7 pg — AB (ref 26.0–34.0)
MCHC: 33.7 g/dL (ref 32.0–36.0)
MCV: 108.7 fL — ABNORMAL HIGH (ref 80.0–100.0)
Platelets: 193 10*3/uL (ref 150–440)
RBC: 2.69 MIL/uL — AB (ref 3.80–5.20)
RDW: 15.5 % — ABNORMAL HIGH (ref 11.5–14.5)
WBC: 14.4 10*3/uL — ABNORMAL HIGH (ref 3.6–11.0)

## 2015-07-31 NOTE — ED Notes (Addendum)
Pt to triage via w/c with no distress noted; pt reports N/V tonight with midsternal CP; pt seen here recently for same and told "might be my esophagus or my lungs or something"; pt currently with PD--has not performed tonight

## 2015-08-01 ENCOUNTER — Emergency Department
Admission: EM | Admit: 2015-08-01 | Discharge: 2015-08-01 | Disposition: A | Discharge status: Home / Self Care | Payer: Medicare Other | Attending: Emergency Medicine | Admitting: Emergency Medicine

## 2015-08-01 DIAGNOSIS — R079 Chest pain, unspecified: Secondary | ICD-10-CM

## 2015-08-01 DIAGNOSIS — R112 Nausea with vomiting, unspecified: Secondary | ICD-10-CM

## 2015-08-01 LAB — LIPASE, BLOOD: LIPASE: 22 U/L (ref 22–51)

## 2015-08-01 LAB — TROPONIN I: Troponin I: 0.03 ng/mL (ref <0.031)

## 2015-08-01 MED ORDER — METOCLOPRAMIDE HCL 5 MG/ML IJ SOLN
10.0000 mg | Freq: Once | INTRAMUSCULAR | Status: AC
  Administered 2015-08-01: 10 mg via INTRAVENOUS
  Filled 2015-08-01: qty 2

## 2015-08-01 MED ORDER — GI COCKTAIL ~~LOC~~
30.0000 mL | Freq: Once | ORAL | Status: AC
  Administered 2015-08-01: 30 mL via ORAL
  Filled 2015-08-01: qty 30

## 2015-08-01 MED ORDER — METOCLOPRAMIDE HCL 10 MG PO TABS: 10.0000 mg | ORAL_TABLET | Freq: Three times a day (TID) | ORAL | Status: DC | PRN

## 2015-08-01 MED ORDER — ONDANSETRON HCL 4 MG/2ML IJ SOLN
4.0000 mg | Freq: Once | INTRAMUSCULAR | Status: AC
  Administered 2015-08-01: 4 mg via INTRAVENOUS
  Filled 2015-08-01: qty 2

## 2015-08-01 NOTE — ED Notes (Signed)
PO challenge finished, pt denies pain, n/v at this time.

## 2015-08-01 NOTE — ED Provider Notes (Signed)
[facility] Emergency Department Provider Note  ____________________________________________  Time seen: Approximately 0038 AM  I have reviewed the triage vital signs and the nursing notes.   HISTORY  Chief Complaint Emesis and Chest Pain    HPI Laurie Steele is a 74 y.o. female who comes into the hospital with vomiting. The patient reports that she vomited from about 7 PM to 8:30. She reports that afterwards she felt some burning in her chest and felt as though there was some fluid in her throat. The patient reports that she felt hot when she developed the chest pain. She was nervous that she decided to come in and get it checked out. The patient reports that when she burps she feels better and her chest. She had some mild shortness of breath and sweats and the pain in her chest currently a 3 out of 10. She reports that she's never had the burning before but has had some chest pressure before the patient is end-stage renal disease and does peritoneal dialysis nightly. The patient reports that she is supposed be currently doing dialysis at this time. The patient denies any abdominal pain but reports that she does feel some nausea. His unsure exactly why she is feeling unwell. She denies any headache or lightheadedness no blurry vision no other complaints at this time.   Past Medical History  Diagnosis Date  . Hx of colonic polyps   . Hyperlipidemia   . Hypertension   . Renal failure     Dr Holley Raring  . 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 Chronic anemia     Dr Inez Pilgrim  . Glaucoma   . GERD (gastroesophageal reflux disease)   . Gout   . Miscarriage   . Vaginal delivery     x 4    Patient Active Problem List   Diagnosis Date Noted  . Bronchitis 03/03/2012  . Type II or unspecified type diabetes mellitus  with renal manifestations, not stated as uncontrolled 12/18/2011  . END STAGE RENAL DISEASE 10/23/2010  . NEUROPATHY 12/13/2009  . GOUT 02/15/2009  . SLEEP DISORDER 02/15/2009  . GERD 02/23/2008  . HYPERLIPIDEMIA 05/14/2007  . ANEMIA, CHRONIC 05/14/2007  . HYPERTENSION 05/14/2007    Past Surgical History  Procedure Laterality Date  . Cholecystectomy    . Abdominal hysterectomy  1983  . Back surgery  6/08    Dr Collier Salina  . Back surgery  1987    disc    Current Outpatient Rx  Name  Route  Sig  Dispense  Refill  . amLODipine (NORVASC) 5 MG tablet   Oral   Take 5 mg by mouth daily.           Marland Kitchen aspirin EC 325 MG tablet   Oral   Take 325 mg by mouth daily.         . B Complex-C-Folic Acid (DIALYVITE Q000111Q PO)   Oral   Take 1 tablet by mouth daily.         . calcitRIOL (ROCALTROL) 0.5 MCG capsule   Oral   Take 0.5 mcg by mouth daily.           . cinacalcet (SENSIPAR) 90 MG tablet   Oral   Take 90 mg by mouth daily.         Marland Kitchen  cloNIDine (CATAPRES) 0.3 MG tablet   Oral   Take 1 tablet (0.3 mg total) by mouth 2 (two) times daily.   60 tablet   11   . fluticasone (FLONASE) 50 MCG/ACT nasal spray   Each Nare   Place 2 sprays into both nostrils daily.         . hydrALAZINE (APRESOLINE) 50 MG tablet   Oral   Take 50 mg by mouth 3 (three) times daily.           . insulin aspart (NOVOLOG) 100 UNIT/ML injection   Subcutaneous   Inject 20-48 Units into the skin 2 (two) times daily. Pt uses 20 units in the morning and 48 units in the evening.         . insulin glargine (LANTUS) 100 UNIT/ML injection   Subcutaneous   Inject 0.8 mLs (80 Units total) into the skin at bedtime. Patient taking differently: Inject 70 Units into the skin at bedtime.    7 vial   3   . ipratropium (ATROVENT) 0.03 % nasal spray   Each Nare   Place 1 spray into both nostrils 3 (three) times daily as needed for rhinitis.         Marland Kitchen isosorbide mononitrate (IMDUR) 30 MG 24 hr  tablet   Oral   Take 30 mg by mouth 2 (two) times daily.         Marland Kitchen latanoprost (XALATAN) 0.005 % ophthalmic solution   Both Eyes   Place 1 drop into both eyes at bedtime.         Marland Kitchen losartan (COZAAR) 50 MG tablet   Oral   Take 50 mg by mouth at bedtime.          . metoCLOPramide (REGLAN) 10 MG tablet   Oral   Take 1 tablet (10 mg total) by mouth every 8 (eight) hours as needed for nausea.   15 tablet   0   . multivitamin (RENA-VIT) TABS tablet   Oral   Take 1 tablet by mouth daily.           . ondansetron (ZOFRAN) 8 MG tablet   Oral   Take 8 mg by mouth every 8 (eight) hours as needed for nausea or vomiting.         . pantoprazole (PROTONIX) 40 MG tablet   Oral   Take 40 mg by mouth daily.         . pravastatin (PRAVACHOL) 80 MG tablet   Oral   Take 1 tablet (80 mg total) by mouth daily.   90 tablet   3   . sevelamer (RENVELA) 800 MG tablet   Oral   Take 800 mg by mouth 3 (three) times daily with meals.           . timolol (BETIMOL) 0.5 % ophthalmic solution   Both Eyes   Place 1 drop into both eyes 2 (two) times daily.           Allergies Review of patient's allergies indicates no known allergies.  Family History  Problem Relation Age of Onset  . Heart attack Father   . Hypertension Mother   . Hyperlipidemia Mother   . Coronary artery disease      strong fam hx    Social History Social History  Substance Use Topics  . Smoking status: Former Smoker    Quit date: 11/05/1990  . Smokeless tobacco: Never Used  . Alcohol Use: No    Review of  Systems Constitutional: No fever/chills Eyes: No visual changes. ENT: No sore throat. Cardiovascular:  chest pain. Respiratory:  shortness of breath. Gastrointestinal: Nausea and vomiting with No abdominal pain.    No diarrhea.  No constipation. Genitourinary: Negative for dysuria. Musculoskeletal: Negative for back pain. Skin: Negative for rash. Neurological: Negative for headaches, focal  weakness or numbness.  10-point ROS otherwise negative.  ____________________________________________   PHYSICAL EXAM:  VITAL SIGNS: ED Triage Vitals  Enc Vitals Group     BP 07/31/15 2306 185/62 mmHg     Pulse Rate 07/31/15 2306 102     Resp 07/31/15 2306 20     Temp 07/31/15 2306 97.9 F (36.6 C)     Temp Source 07/31/15 2306 Oral     SpO2 07/31/15 2306 96 %     Weight 07/31/15 2306 215 lb (97.523 kg)     Height 07/31/15 2306 5\' 7"  (1.702 m)     Head Cir --      Peak Flow --      Pain Score 07/31/15 2305 4     Pain Loc --      Pain Edu? --      Excl. in [place]? --     Constitutional: Alert and oriented. Well appearing and in mild distress. Eyes: Conjunctivae are normal. PERRL. EOMI. Head: Atraumatic. Nose: No congestion/rhinnorhea. Mouth/Throat: Mucous membranes are moist.  Oropharynx non-erythematous. Cardiovascular: Normal rate, regular rhythm. Grossly normal heart sounds.  Good peripheral circulation. Respiratory: Normal respiratory effort.  No retractions. Lungs CTAB. Gastrointestinal: Soft and nontender. No distention.  Musculoskeletal: No lower extremity tenderness nor edema.   Neurologic:  Normal speech and language.  Skin:  Skin is warm, dry and intact.  Psychiatric: Mood and affect are normal.   ____________________________________________   LABS (all labs ordered are listed, but only abnormal results are displayed)  Labs Reviewed  BASIC METABOLIC PANEL - Abnormal; Notable for the following:    Potassium 3.4 (*)    Glucose, Bld 62 (*)    BUN 53 (*)    Creatinine, Ser 9.63 (*)    GFR calc non Af Amer 3 (*)    GFR calc Af Amer 4 (*)    All other components within normal limits  CBC - Abnormal; Notable for the following:    WBC 14.4 (*)    RBC 2.69 (*)    Hemoglobin 9.9 (*)    HCT 29.3 (*)    MCV 108.7 (*)    MCH 36.7 (*)    RDW 15.5 (*)    All other components within normal limits  TROPONIN I  LIPASE, BLOOD  TROPONIN I    ____________________________________________  EKG  ED ECG REPORT I, Loney Hering, the attending physician, personally viewed and interpreted this ECG.   Date: 07/31/2015  EKG Time: 2313  Rate: 94  Rhythm: normal sinus rhythm  Axis: left axis deviation  Intervals:none  ST&T Change: none  ____________________________________________  RADIOLOGY  CXR: No acute pulmonary process ____________________________________________   PROCEDURES  Procedure(s) performed: None  Critical Care performed: No  ____________________________________________   INITIAL IMPRESSION / ASSESSMENT AND PLAN / ED COURSE  Pertinent labs & imaging results that were available during my care of the patient were reviewed by me and considered in my medical decision making (see chart for details).  This is 74 year old female who comes in today with some chest pain and vomiting. The patient's blood work is unremarkable and her chest x-ray is also negative. I did give the  patient dose of Zofran and a GI cocktail and the patient did have some vomiting. I then gave the patient a dose of Reglan and monitored her and the vomiting improved. After some time I did give the patient some ice chips and then crackers and ginger ale which she was able to keep down without difficulty. The patient's blood work does not show any significant abnormality. She does have some mild elevation of her white blood cell count but does not have any abdominal pain or pneumonia on x-ray. The patient does peritoneal dialysis and has not had any difficulty with that as well. As the patient is able to keep down some solids and liquids a field the patient is able to be discharged home. I will have the patient follow up with her primary care physician and I will give her some Reglan for her nausea and vomiting. Otherwise the patient has no further questions or complaints that she'll be discharged  home. ____________________________________________   FINAL CLINICAL IMPRESSION(S) / ED DIAGNOSES  Final diagnoses:  Chest pain, unspecified chest pain type  Non-intractable vomiting with nausea, vomiting of unspecified type      Loney Hering, MD 08/01/15 [phone]

## 2015-08-01 NOTE — ED Notes (Signed)
Patient states she threw up around 1900 tonight. Threw up about 3 times over the next hour. Since then has had epigastric pain, throat pain. States she feels better when she burps.

## 2015-08-01 NOTE — Discharge Instructions (Signed)
Chest Pain (Nonspecific) °It is often hard to give a specific diagnosis for the cause of chest pain. There is always a chance that your pain could be related to something serious, such as a heart attack or a blood clot in the lungs. You need to follow up with your health care provider for further evaluation. °CAUSES  °· Heartburn. °· Pneumonia or bronchitis. °· Anxiety or stress. °· Inflammation around your heart (pericarditis) or lung (pleuritis or pleurisy). °· A blood clot in the lung. °· A collapsed lung (pneumothorax). It can develop suddenly on its own (spontaneous pneumothorax) or from trauma to the chest. °· Shingles infection (herpes zoster virus). °The chest wall is composed of bones, muscles, and cartilage. Any of these can be the source of the pain. °· The bones can be bruised by injury. °· The muscles or cartilage can be strained by coughing or overwork. °· The cartilage can be affected by inflammation and become sore (costochondritis). °DIAGNOSIS  °Lab tests or other studies may be needed to find the cause of your pain. Your health care provider may have you take a test called an ambulatory electrocardiogram (ECG). An ECG records your heartbeat patterns over a 24-hour period. You may also have other tests, such as: °· Transthoracic echocardiogram (TTE). During echocardiography, sound waves are used to evaluate how blood flows through your heart. °· Transesophageal echocardiogram (TEE). °· Cardiac monitoring. This allows your health care provider to monitor your heart rate and rhythm in real time. °· Holter monitor. This is a portable device that records your heartbeat and can help diagnose heart arrhythmias. It allows your health care provider to track your heart activity for several days, if needed. °· Stress tests by exercise or by giving medicine that makes the heart beat faster. °TREATMENT  °· Treatment depends on what may be causing your chest pain. Treatment may include: °· Acid blockers for  heartburn. °· Anti-inflammatory medicine. °· Pain medicine for inflammatory conditions. °· Antibiotics if an infection is present. °· You may be advised to change lifestyle habits. This includes stopping smoking and avoiding alcohol, caffeine, and chocolate. °· You may be advised to keep your head raised (elevated) when sleeping. This reduces the chance of acid going backward from your stomach into your esophagus. °Most of the time, nonspecific chest pain will improve within 2-3 days with rest and mild pain medicine.  °HOME CARE INSTRUCTIONS  °· If antibiotics were prescribed, take them as directed. Finish them even if you start to feel better. °· For the next few days, avoid physical activities that bring on chest pain. Continue physical activities as directed. °· Do not use any tobacco products, including cigarettes, chewing tobacco, or electronic cigarettes. °· Avoid drinking alcohol. °· Only take medicine as directed by your health care provider. °· Follow your health care provider's suggestions for further testing if your chest pain does not go away. °· Keep any follow-up appointments you made. If you do not go to an appointment, you could develop lasting (chronic) problems with pain. If there is any problem keeping an appointment, call to reschedule. °SEEK MEDICAL CARE IF:  °· Your chest pain does not go away, even after treatment. °· You have a rash with blisters on your chest. °· You have a fever. °SEEK IMMEDIATE MEDICAL CARE IF:  °· You have increased chest pain or pain that spreads to your arm, neck, jaw, back, or abdomen. °· You have shortness of breath. °· You have an increasing cough, or you cough   up blood.  You have severe back or abdominal pain.  You feel nauseous or vomit.  You have severe weakness.  You faint.  You have chills. This is an emergency. Do not wait to see if the pain will go away. Get medical help at once. Call your local emergency services (911 in [place] Do not drive  yourself to the hospital. MAKE SURE YOU:   Understand these instructions.  Will watch your condition.  Will get help right away if you are not doing well or get worse. Document Released: 08/01/2005 Document Revised: 10/27/2013 Document Reviewed: 05/27/2008 [facility] Patient Information 2015 [place], [place]. This information is not intended to replace advice given to you by your health care provider. Make sure you discuss any questions you have with your health care provider.  Nausea and Vomiting Nausea is a sick feeling that often comes before throwing up (vomiting). Vomiting is a reflex where stomach contents come out of your mouth. Vomiting can cause severe loss of body fluids (dehydration). Children and elderly adults can become dehydrated quickly, especially if they also have diarrhea. Nausea and vomiting are symptoms of a condition or disease. It is important to find the cause of your symptoms. CAUSES   Direct irritation of the stomach lining. This irritation can result from increased acid production (gastroesophageal reflux disease), infection, food poisoning, taking certain medicines (such as nonsteroidal anti-inflammatory drugs), alcohol use, or tobacco use.  Signals from the brain.These signals could be caused by a headache, heat exposure, an inner ear disturbance, increased pressure in the brain from injury, infection, a tumor, or a concussion, pain, emotional stimulus, or metabolic problems.  An obstruction in the gastrointestinal tract (bowel obstruction).  Illnesses such as diabetes, hepatitis, gallbladder problems, appendicitis, kidney problems, cancer, sepsis, atypical symptoms of a heart attack, or eating disorders.  Medical treatments such as chemotherapy and radiation.  Receiving medicine that makes you sleep (general anesthetic) during surgery. DIAGNOSIS Your caregiver may ask for tests to be done if the problems do not improve after a few days. Tests may also be done if  symptoms are severe or if the reason for the nausea and vomiting is not clear. Tests may include:  Urine tests.  Blood tests.  Stool tests.  Cultures (to look for evidence of infection).  X-rays or other imaging studies. Test results can help your caregiver make decisions about treatment or the need for additional tests. TREATMENT You need to stay well hydrated. Drink frequently but in small amounts.You may wish to drink water, sports drinks, clear broth, or eat frozen ice pops or gelatin dessert to help stay hydrated.When you eat, eating slowly may help prevent nausea.There are also some antinausea medicines that may help prevent nausea. HOME CARE INSTRUCTIONS   Take all medicine as directed by your caregiver.  If you do not have an appetite, do not force yourself to eat. However, you must continue to drink fluids.  If you have an appetite, eat a normal diet unless your caregiver tells you differently.  Eat a variety of complex carbohydrates (rice, wheat, potatoes, bread), lean meats, yogurt, fruits, and vegetables.  Avoid high-fat foods because they are more difficult to digest.  Drink enough water and fluids to keep your urine clear or pale yellow.  If you are dehydrated, ask your caregiver for specific rehydration instructions. Signs of dehydration may include:  Severe thirst.  Dry lips and mouth.  Dizziness.  Dark urine.  Decreasing urine frequency and amount.  Confusion.  Rapid breathing or  pulse. SEEK IMMEDIATE MEDICAL CARE IF:   You have blood or brown flecks (like coffee grounds) in your vomit.  You have black or bloody stools.  You have a severe headache or stiff neck.  You are confused.  You have severe abdominal pain.  You have chest pain or trouble breathing.  You do not urinate at least once every 8 hours.  You develop cold or clammy skin.  You continue to vomit for longer than 24 to 48 hours.  You have a fever. MAKE SURE YOU:    Understand these instructions.  Will watch your condition.  Will get help right away if you are not doing well or get worse. Document Released: 10/22/2005 Document Revised: 01/14/2012 Document Reviewed: 03/21/2011 [facility] Patient Information 2015 [employer], [place]. This information is not intended to replace advice given to you by your health care provider. Make sure you discuss any questions you have with your health care provider.

## 2015-08-01 NOTE — ED Notes (Signed)
Patient appears to be improving after getting medication.  Resting quietly. Husband at bedside. Patient on monitor.

## 2015-08-08 ENCOUNTER — Emergency Department: Payer: Medicare Other

## 2015-08-08 ENCOUNTER — Emergency Department
Admission: EM | Admit: 2015-08-08 | Discharge: 2015-08-08 | Disposition: A | Discharge status: Home / Self Care | Payer: Medicare Other | Attending: Emergency Medicine | Admitting: Emergency Medicine

## 2015-08-08 ENCOUNTER — Encounter: Payer: Self-pay | Admitting: Emergency Medicine

## 2015-08-08 DIAGNOSIS — Z87891 Personal history of nicotine dependence: Secondary | ICD-10-CM | POA: Insufficient documentation

## 2015-08-08 DIAGNOSIS — Z79899 Other long term (current) drug therapy: Secondary | ICD-10-CM | POA: Insufficient documentation

## 2015-08-08 DIAGNOSIS — Z7982 Long term (current) use of aspirin: Secondary | ICD-10-CM | POA: Insufficient documentation

## 2015-08-08 DIAGNOSIS — K6289 Other specified diseases of anus and rectum: Secondary | ICD-10-CM | POA: Insufficient documentation

## 2015-08-08 DIAGNOSIS — E11319 Type 2 diabetes mellitus with unspecified diabetic retinopathy without macular edema: Secondary | ICD-10-CM | POA: Diagnosis not present

## 2015-08-08 DIAGNOSIS — E1140 Type 2 diabetes mellitus with diabetic neuropathy, unspecified: Secondary | ICD-10-CM | POA: Diagnosis not present

## 2015-08-08 DIAGNOSIS — Z794 Long term (current) use of insulin: Secondary | ICD-10-CM | POA: Diagnosis not present

## 2015-08-08 DIAGNOSIS — I10 Essential (primary) hypertension: Secondary | ICD-10-CM | POA: Diagnosis not present

## 2015-08-08 DIAGNOSIS — F419 Anxiety disorder, unspecified: Secondary | ICD-10-CM | POA: Diagnosis not present

## 2015-08-08 LAB — COMPREHENSIVE METABOLIC PANEL
ALK PHOS: 88 U/L (ref 38–126)
ALT: 16 U/L (ref 14–54)
ANION GAP: 14 (ref 5–15)
AST: 24 U/L (ref 15–41)
Albumin: 3.1 g/dL — ABNORMAL LOW (ref 3.5–5.0)
BILIRUBIN TOTAL: 0.4 mg/dL (ref 0.3–1.2)
BUN: 45 mg/dL — ABNORMAL HIGH (ref 6–20)
CALCIUM: 8.9 mg/dL (ref 8.9–10.3)
CO2: 26 mmol/L (ref 22–32)
Chloride: 97 mmol/L — ABNORMAL LOW (ref 101–111)
Creatinine, Ser: 9.31 mg/dL — ABNORMAL HIGH (ref 0.44–1.00)
GFR, EST AFRICAN AMERICAN: 4 mL/min — AB (ref >60)
GFR, EST NON AFRICAN AMERICAN: 4 mL/min — AB (ref >60)
GLUCOSE: 177 mg/dL — AB (ref 65–99)
POTASSIUM: 3.7 mmol/L (ref 3.5–5.1)
Sodium: 137 mmol/L (ref 135–145)
TOTAL PROTEIN: 6.8 g/dL (ref 6.5–8.1)

## 2015-08-08 LAB — CBC
HEMATOCRIT: 29.1 % — AB (ref 35.0–47.0)
HEMOGLOBIN: 9.4 g/dL — AB (ref 12.0–16.0)
MCH: 35.6 pg — ABNORMAL HIGH (ref 26.0–34.0)
MCHC: 32.4 g/dL (ref 32.0–36.0)
MCV: 109.9 fL — AB (ref 80.0–100.0)
Platelets: 238 10*3/uL (ref 150–440)
RBC: 2.65 MIL/uL — AB (ref 3.80–5.20)
RDW: 15.4 % — AB (ref 11.5–14.5)
WBC: 17.9 10*3/uL — AB (ref 3.6–11.0)

## 2015-08-08 MED ORDER — CIPROFLOXACIN HCL 500 MG PO TABS: 500.0000 mg | ORAL_TABLET | Freq: Two times a day (BID) | ORAL | Status: DC

## 2015-08-08 MED ORDER — MORPHINE SULFATE (PF) 2 MG/ML IV SOLN
2.0000 mg | Freq: Once | INTRAVENOUS | Status: AC
  Administered 2015-08-08: 2 mg via INTRAVENOUS
  Filled 2015-08-08: qty 1

## 2015-08-08 MED ORDER — HYDROCODONE-ACETAMINOPHEN 5-325 MG PO TABS: 1.0000 | ORAL_TABLET | ORAL | Status: DC | PRN

## 2015-08-08 MED ORDER — METRONIDAZOLE 500 MG PO TABS: 500.0000 mg | ORAL_TABLET | Freq: Two times a day (BID) | ORAL | Status: DC

## 2015-08-08 MED ORDER — ONDANSETRON HCL 4 MG/2ML IJ SOLN
4.0000 mg | Freq: Once | INTRAMUSCULAR | Status: AC
  Administered 2015-08-08: 4 mg via INTRAVENOUS
  Filled 2015-08-08: qty 2

## 2015-08-08 MED ORDER — IOHEXOL 240 MG/ML SOLN
25.0000 mL | Freq: Once | INTRAMUSCULAR | Status: AC | PRN
  Administered 2015-08-08: 25 mL via ORAL

## 2015-08-08 MED ORDER — IOHEXOL 300 MG/ML  SOLN
100.0000 mL | Freq: Once | INTRAMUSCULAR | Status: AC | PRN
  Administered 2015-08-08: 100 mL via INTRAVENOUS

## 2015-08-08 MED ORDER — DOCUSATE SODIUM 100 MG PO CAPS: 100.0000 mg | ORAL_CAPSULE | Freq: Two times a day (BID) | ORAL | Status: DC

## 2015-08-08 NOTE — ED Notes (Signed)
Reports rectal pain since Saturday. Denies bleeding or abd pain.  States preparation h is not working

## 2015-08-08 NOTE — ED Provider Notes (Addendum)
[facility] Emergency Department Provider Note  ____________________________________________  Time seen: On arrival  I have reviewed the triage vital signs and the nursing notes.   HISTORY  Chief Complaint Rectal Bleeding    HPI Laurie Steele is a 74 y.o. female who presents with rectal pain for 3 days. She reports at first she thought this was related to hemorrhoids and tried Preparation H with no improvement. She is never had this pain before. She denies fevers chills. She has no abdominal pain. She does have a history of bowel incontinence and wears diapers. She has not had nausea or vomiting. No injury to the area.She reports the pain is so severe she has difficulty ambulating     Past Medical History  Diagnosis Date  . Hx of colonic polyps   . Hyperlipidemia   . Hypertension   . Renal failure     Dr Holley Raring  . 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 Chronic anemia     Dr Inez Pilgrim  . Glaucoma   . GERD (gastroesophageal reflux disease)   . Gout   . Miscarriage   . Vaginal delivery     x 4    Patient Active Problem List   Diagnosis Date Noted  . Bronchitis 03/03/2012  . Type II or unspecified type diabetes mellitus with renal manifestations, not stated as uncontrolled 12/18/2011  . END STAGE RENAL DISEASE 10/23/2010  . NEUROPATHY 12/13/2009  . GOUT 02/15/2009  . SLEEP DISORDER 02/15/2009  . GERD 02/23/2008  . HYPERLIPIDEMIA 05/14/2007  . ANEMIA, CHRONIC 05/14/2007  . HYPERTENSION 05/14/2007    Past Surgical History  Procedure Laterality Date  . Cholecystectomy    . Abdominal hysterectomy  1983  . Back surgery  6/08    Dr Collier Salina  . Back surgery  1987    disc    Current Outpatient Rx  Name  Route  Sig  Dispense  Refill  . amLODipine (NORVASC) 5 MG tablet    Oral   Take 5 mg by mouth daily.           Marland Kitchen aspirin EC 325 MG tablet   Oral   Take 325 mg by mouth daily.         . B Complex-C-Folic Acid (DIALYVITE Q000111Q PO)   Oral   Take 1 tablet by mouth daily.         . calcitRIOL (ROCALTROL) 0.5 MCG capsule   Oral   Take 0.5 mcg by mouth daily.           . cinacalcet (SENSIPAR) 90 MG tablet   Oral   Take 90 mg by mouth daily.         . cloNIDine (CATAPRES) 0.3 MG tablet   Oral   Take 1 tablet (0.3 mg total) by mouth 2 (two) times daily.   60 tablet   11   . fluticasone (FLONASE) 50 MCG/ACT nasal spray   Each Nare   Place 2 sprays into both nostrils daily.         . hydrALAZINE (APRESOLINE) 50 MG tablet   Oral   Take 50 mg by mouth 3 (three) times daily.           . insulin aspart (NOVOLOG) 100 UNIT/ML injection   Subcutaneous   Inject 20-48  Units into the skin 2 (two) times daily. Pt uses 20 units in the morning and 48 units in the evening.         . insulin glargine (LANTUS) 100 UNIT/ML injection   Subcutaneous   Inject 0.8 mLs (80 Units total) into the skin at bedtime. Patient taking differently: Inject 70 Units into the skin at bedtime.    7 vial   3   . ipratropium (ATROVENT) 0.03 % nasal spray   Each Nare   Place 1 spray into both nostrils 3 (three) times daily as needed for rhinitis.         Marland Kitchen isosorbide mononitrate (IMDUR) 30 MG 24 hr tablet   Oral   Take 30 mg by mouth 2 (two) times daily.         Marland Kitchen latanoprost (XALATAN) 0.005 % ophthalmic solution   Both Eyes   Place 1 drop into both eyes at bedtime.         Marland Kitchen losartan (COZAAR) 50 MG tablet   Oral   Take 50 mg by mouth at bedtime.          . metoCLOPramide (REGLAN) 10 MG tablet   Oral   Take 1 tablet (10 mg total) by mouth every 8 (eight) hours as needed for nausea.   15 tablet   0   . multivitamin (RENA-VIT) TABS tablet   Oral   Take 1 tablet by mouth daily.           . ondansetron (ZOFRAN) 8 MG tablet   Oral   Take 8 mg  by mouth every 8 (eight) hours as needed for nausea or vomiting.         . pantoprazole (PROTONIX) 40 MG tablet   Oral   Take 40 mg by mouth daily.         . pravastatin (PRAVACHOL) 80 MG tablet   Oral   Take 1 tablet (80 mg total) by mouth daily.   90 tablet   3   . sevelamer (RENVELA) 800 MG tablet   Oral   Take 800 mg by mouth 3 (three) times daily with meals.           . timolol (BETIMOL) 0.5 % ophthalmic solution   Both Eyes   Place 1 drop into both eyes 2 (two) times daily.           Allergies Review of patient's allergies indicates no known allergies.  Family History  Problem Relation Age of Onset  . Heart attack Father   . Hypertension Mother   . Hyperlipidemia Mother   . Coronary artery disease      strong fam hx    Social History Social History  Substance Use Topics  . Smoking status: Former Smoker    Quit date: 11/05/1990  . Smokeless tobacco: Never Used  . Alcohol Use: No    Review of Systems  Constitutional: Negative for fever. Eyes: Negative for visual changes. ENT: Negative for sore throat Cardiovascular: Negative for chest pain. Respiratory: Negative for shortness of breath. Gastrointestinal: Negative for abdominal pain, vomiting. Positive for rectal pain Genitourinary: Negative for dysuria. Musculoskeletal: Negative for back pain. Skin: Negative for rash. Neurological: Negative for headaches or focal weakness Psychiatric: Mild anxiety    ____________________________________________   PHYSICAL EXAM:  VITAL SIGNS: ED Triage Vitals  Enc Vitals Group     BP 08/08/15 1047 143/57 mmHg     Pulse Rate 08/08/15 1047 92     Resp 08/08/15 1047  16     Temp 08/08/15 1047 98.7 F (37.1 C)     Temp Source 08/08/15 1047 Oral     SpO2 08/08/15 1047 96 %     Weight 08/08/15 1047 215 lb (97.523 kg)     Height 08/08/15 1047 5\' 7"  (1.702 m)     Head Cir --      Peak Flow --      Pain Score 08/08/15 1048 10     Pain Loc --      Pain  Edu? --      Excl. in [place]? --      Constitutional: Alert and oriented. Well appearing and in no distress. Eyes: Conjunctivae are normal.  ENT   Head: Normocephalic and atraumatic.   Mouth/Throat: Mucous membranes are moist. Cardiovascular: Normal rate, regular rhythm. Normal and symmetric distal pulses are present in all extremities. No murmurs, rubs, or gallops. Respiratory: Normal respiratory effort without tachypnea nor retractions. Breath sounds are clear and equal bilaterally.  Gastrointestinal: Soft and non-tender in all quadrants. No distention. There is no CVA tenderness. Patient with tenderness to palpation ofanus, no abscess or abnormalities felt. No skin tear seen. No hemorrhoids seen. She does have some skin breakdown around the anus but this is mild Genitourinary: deferred Musculoskeletal: Nontender with normal range of motion in all extremities. No lower extremity tenderness nor edema. Neurologic:  Normal speech and language. No gross focal neurologic deficits are appreciated. Skin:  Skin is warm, dry and intact. No rash noted. Psychiatric: Mood and affect are normal. Patient exhibits appropriate insight and judgment.  ____________________________________________    LABS (pertinent positives/negatives)  Labs Reviewed  CBC - Abnormal; Notable for the following:    WBC 17.9 (*)    RBC 2.65 (*)    Hemoglobin 9.4 (*)    HCT 29.1 (*)    MCV 109.9 (*)    MCH 35.6 (*)    RDW 15.4 (*)    All other components within normal limits  COMPREHENSIVE METABOLIC PANEL - Abnormal; Notable for the following:    Chloride 97 (*)    Glucose, Bld 177 (*)    BUN 45 (*)    Creatinine, Ser 9.31 (*)    Albumin 3.1 (*)    GFR calc non Af Amer 4 (*)    GFR calc Af Amer 4 (*)    All other components within normal limits    ____________________________________________   EKG  None  ____________________________________________    RADIOLOGY I have personally reviewed any  xrays that were ordered on this patient: CT abdomen pelvis ordered  ____________________________________________   PROCEDURES  Procedure(s) performed: none  Critical Care performed: none  ____________________________________________   INITIAL IMPRESSION / ASSESSMENT AND PLAN / ED COURSE  Pertinent labs & imaging results that were available during my care of the patient were reviewed by me and considered in my medical decision making (see chart for details).  Unclear cause of patient's pain. Given her elevated white blood cell count to palpation exam I will order CT pelvis to evaluate the rectum.  ----------------------------------------- 3:34 PM on 08/08/2015 ----------------------------------------- I will ask Dr. Reita Cliche to follow-up CT abdomen pelvis  ----------------------------------------- 3:59 PM on 08/08/2015 -----------------------------------------  CT results returned while still here, no acute abnormalities seen on CT although she does have significant stool in her colon. Patient does have an elevated white blood cell count but she has had this in the past and she has no fever nor tachycardia. She has no abdominal pain,  her pain is limited to the rectum.  I've offered admission to the patient but she prefers to go home. I recommended sitz bath's, pain medication and stool softeners and close PCP follow-up. I also stressed that the patient should return to the emergency department if any fevers chills or worsening pain. She and her daughter agree with this plan. She will follow-up with her PCP for her elevated BP ____________________________________________   FINAL CLINICAL IMPRESSION(S) / ED DIAGNOSES  Final diagnoses:  Anal or rectal pain     Lavonia Drafts, MD 08/08/15 [place], MD 08/08/15 [phone]

## 2015-08-08 NOTE — Discharge Instructions (Signed)
Proctitis Proctitis is the swelling and soreness (inflammation) of the lining of the rectum. The rectum is at the end of the large intestine and is attached to the anus. The inflammation causes pain and discomfort. It may be short-term (acute) or long-lasting (chronic). CAUSES Inflammation in the rectum can be caused by many things, such as:  Sexually transmitted diseases (STDs).  Infection.  Anal-rectal trauma or injury.  Ulcerative colitis or Crohn's disease.  Radiation therapy directed near the rectum.  Antibiotic therapy. SYMPTOMS  Sudden, uncomfortable, and frequent urge to have a bowel movement.  Anal or rectal pain.  Abdominal cramping or pain.  Sensation that the rectum is full.  Rectal bleeding.  Pus or mucus discharge from anus.  Diarrhea or frequent soft, loose stools. DIAGNOSIS Diagnosis may include the following:  A history and physical exam.  An STD test.  Blood tests.  Stool tests.  Rectal culture.  A procedure to evaluate the anal canal (anoscopy).  Procedures to look at part, or the entire large bowel (sigmoidoscopy, colonoscopy). TREATMENT Treatment of proctitis depends on the cause. Reducing the symptoms of inflammation and eliminating infection are the main goals of treatment. Treatment may include:  Home remedies and lifestyle, such as sitz baths and avoiding food right before bedtime.  Topical ointments, foams, suppositories, or enemas, such as corticosteroids or anti-inflammatories.  Antibiotic or antiviral medicines to treat infection or to control harmful bacteria.  Medicines to control diarrhea, soften stools, and reduce pain.  Medicines to suppress the immune system.  Avoiding the activity that caused rectal trauma.  Nutritional, dietary, or herbal supplements.  Heat or laser therapy for persistent bleeding.  A dilation procedure to enlarge a narrowed rectum.  Surgery, though rare, may be necessary to repair damaged rectal  lining. HOME CARE INSTRUCTIONS Only take medicines that are recommended or approved by your caregiver.Do not take anti-diarrhea medicine without your caregiver's approval. SEEK MEDICAL CARE IF:  You often experience one or more of the symptoms noted above.  You keep experiencing symptoms after treatment.  You have questions or concerns about your symptoms or treatment plan. MAKE SURE YOU:  Understand these instructions.  Will watch your condition.  Will get help right away if you are not doing well or get worse. [place] of Diabetes and Digestive and Kidney Disease (NIDDK): www.digestive.https://gonzalez-best.com/ Document Released: 10/11/2011 Document Revised: 02/16/2013 Document Reviewed: 10/11/2011 [facility] Patient Information 2015 [place], [place]. This information is not intended to replace advice given to you by your health care provider. Make sure you discuss any questions you have with your health care provider.

## 2015-08-08 NOTE — ED Notes (Signed)
Pt getting dressed at this time.

## 2015-08-08 NOTE — ED Notes (Addendum)
Pt states sat she notice a pain near her rectum states she thought it was hemidrosis so used Preparation H states the pain has only gotten worse.   Denies any n/v/d

## 2015-08-10 ENCOUNTER — Emergency Department
Admission: EM | Admit: 2015-08-10 | Discharge: 2015-08-10 | Disposition: A | Discharge status: Home / Self Care | Payer: Medicare Other | Attending: Emergency Medicine | Admitting: Emergency Medicine

## 2015-08-10 DIAGNOSIS — Z792 Long term (current) use of antibiotics: Secondary | ICD-10-CM | POA: Insufficient documentation

## 2015-08-10 DIAGNOSIS — Z992 Dependence on renal dialysis: Secondary | ICD-10-CM | POA: Insufficient documentation

## 2015-08-10 DIAGNOSIS — E1122 Type 2 diabetes mellitus with diabetic chronic kidney disease: Secondary | ICD-10-CM | POA: Insufficient documentation

## 2015-08-10 DIAGNOSIS — E11319 Type 2 diabetes mellitus with unspecified diabetic retinopathy without macular edema: Secondary | ICD-10-CM | POA: Insufficient documentation

## 2015-08-10 DIAGNOSIS — Z87891 Personal history of nicotine dependence: Secondary | ICD-10-CM | POA: Diagnosis not present

## 2015-08-10 DIAGNOSIS — Z794 Long term (current) use of insulin: Secondary | ICD-10-CM | POA: Insufficient documentation

## 2015-08-10 DIAGNOSIS — N390 Urinary tract infection, site not specified: Secondary | ICD-10-CM | POA: Insufficient documentation

## 2015-08-10 DIAGNOSIS — N186 End stage renal disease: Secondary | ICD-10-CM | POA: Diagnosis not present

## 2015-08-10 DIAGNOSIS — R339 Retention of urine, unspecified: Secondary | ICD-10-CM | POA: Diagnosis present

## 2015-08-10 DIAGNOSIS — I120 Hypertensive chronic kidney disease with stage 5 chronic kidney disease or end stage renal disease: Secondary | ICD-10-CM | POA: Insufficient documentation

## 2015-08-10 DIAGNOSIS — Z7982 Long term (current) use of aspirin: Secondary | ICD-10-CM | POA: Insufficient documentation

## 2015-08-10 DIAGNOSIS — Z79899 Other long term (current) drug therapy: Secondary | ICD-10-CM | POA: Diagnosis not present

## 2015-08-10 DIAGNOSIS — K5900 Constipation, unspecified: Secondary | ICD-10-CM | POA: Insufficient documentation

## 2015-08-10 LAB — COMPREHENSIVE METABOLIC PANEL
ALBUMIN: 2.9 g/dL — AB (ref 3.5–5.0)
ALT: 15 U/L (ref 14–54)
ANION GAP: 15 (ref 5–15)
AST: 17 U/L (ref 15–41)
Alkaline Phosphatase: 88 U/L (ref 38–126)
BUN: 48 mg/dL — ABNORMAL HIGH (ref 6–20)
CHLORIDE: 97 mmol/L — AB (ref 101–111)
CO2: 25 mmol/L (ref 22–32)
Calcium: 8.5 mg/dL — ABNORMAL LOW (ref 8.9–10.3)
Creatinine, Ser: 9.91 mg/dL — ABNORMAL HIGH (ref 0.44–1.00)
GFR calc non Af Amer: 3 mL/min — ABNORMAL LOW (ref >60)
GFR, EST AFRICAN AMERICAN: 4 mL/min — AB (ref >60)
Glucose, Bld: 133 mg/dL — ABNORMAL HIGH (ref 65–99)
Potassium: 3.4 mmol/L — ABNORMAL LOW (ref 3.5–5.1)
SODIUM: 137 mmol/L (ref 135–145)
Total Bilirubin: 0.4 mg/dL (ref 0.3–1.2)
Total Protein: 6.8 g/dL (ref 6.5–8.1)

## 2015-08-10 LAB — URINALYSIS COMPLETE WITH MICROSCOPIC (ARMC ONLY)
Bilirubin Urine: NEGATIVE
Glucose, UA: NEGATIVE mg/dL
Ketones, ur: NEGATIVE mg/dL
Nitrite: NEGATIVE
PROTEIN: 100 mg/dL — AB
SPECIFIC GRAVITY, URINE: 1.02 (ref 1.005–1.030)
pH: 7 (ref 5.0–8.0)

## 2015-08-10 LAB — CBC
HCT: 28.7 % — ABNORMAL LOW (ref 35.0–47.0)
HEMOGLOBIN: 9.6 g/dL — AB (ref 12.0–16.0)
MCH: 36.3 pg — AB (ref 26.0–34.0)
MCHC: 33.4 g/dL (ref 32.0–36.0)
MCV: 108.8 fL — AB (ref 80.0–100.0)
Platelets: 241 10*3/uL (ref 150–440)
RBC: 2.63 MIL/uL — AB (ref 3.80–5.20)
RDW: 15 % — ABNORMAL HIGH (ref 11.5–14.5)
WBC: 15.8 10*3/uL — ABNORMAL HIGH (ref 3.6–11.0)

## 2015-08-10 LAB — GLUCOSE, CAPILLARY: Glucose-Capillary: 146 mg/dL — ABNORMAL HIGH (ref 65–99)

## 2015-08-10 LAB — LIPASE, BLOOD: LIPASE: 15 U/L — AB (ref 22–51)

## 2015-08-10 MED ORDER — MORPHINE SULFATE (PF) 4 MG/ML IV SOLN
INTRAVENOUS | Status: AC
  Administered 2015-08-10: 4 mg via INTRAMUSCULAR
  Filled 2015-08-10: qty 1

## 2015-08-10 MED ORDER — POLYETHYLENE GLYCOL 3350 17 GM/SCOOP PO POWD: 17.0000 g | Freq: Every day | ORAL | Status: AC

## 2015-08-10 MED ORDER — CEPHALEXIN 250 MG PO CAPS: 250.0000 mg | ORAL_CAPSULE | Freq: Every day | ORAL | Status: AC

## 2015-08-10 MED ORDER — MORPHINE SULFATE (PF) 4 MG/ML IV SOLN
4.0000 mg | Freq: Once | INTRAVENOUS | Status: AC
  Administered 2015-08-10: 4 mg via INTRAMUSCULAR

## 2015-08-10 MED ORDER — CEPHALEXIN 250 MG PO CAPS
250.0000 mg | ORAL_CAPSULE | Freq: Once | ORAL | Status: AC
  Administered 2015-08-10: 250 mg via ORAL
  Filled 2015-08-10: qty 1

## 2015-08-10 NOTE — ED Notes (Signed)
Soap suds enema administered. Pt tolerated well. Pt now sitting on toilet attempting BM. Pt to notify staff when ready to return to stretcher. Call bell within reach. MD and Primary RN made aware.

## 2015-08-10 NOTE — ED Notes (Signed)
Pt states she is a dialysis pt with home peritoneal diaylsis, states she normal urinate 3x/day and states she has not urinated since yesterday but feels like she needs to.. Pt also c/o having loose stools since Sunday and was seen here for same with lower back pain

## 2015-08-10 NOTE — Discharge Instructions (Signed)
Constipation, Adult Constipation is when a person:  Poops (has a bowel movement) less than 3 times a week.  Has a hard time pooping.  Has poop that is dry, hard, or bigger than normal. HOME CARE   Eat foods with a lot of fiber in them. This includes fruits, vegetables, beans, and whole grains such as brown rice.  Avoid fatty foods and foods with a lot of sugar. This includes french fries, hamburgers, cookies, candy, and soda.  If you are not getting enough fiber from food, take products with added fiber in them (supplements).  Drink enough fluid to keep your pee (urine) clear or pale yellow.  Exercise on a regular basis, or as told by your doctor.  Go to the restroom when you feel like you need to poop. Do not hold it.  Only take medicine as told by your doctor. Do not take medicines that help you poop (laxatives) without talking to your doctor first. GET HELP RIGHT AWAY IF:   You have bright red blood in your poop (stool).  Your constipation lasts more than 4 days or gets worse.  You have belly (abdominal) or butt (rectal) pain.  You have thin poop (as thin as a pencil).  You lose weight, and it cannot be explained. MAKE SURE YOU:   Understand these instructions.  Will watch your condition.  Will get help right away if you are not doing well or get worse.   This information is not intended to replace advice given to you by your health care provider. Make sure you discuss any questions you have with your health care provider.   Document Released: 04/09/2008 Document Revised: 11/12/2014 Document Reviewed: 08/03/2013 Elsevier Interactive Patient Education 2016 Elsevier Inc.  Urinary Tract Infection A urinary tract infection (UTI) can occur any place along the urinary tract. The tract includes the kidneys, ureters, bladder, and urethra. A type of germ called bacteria often causes a UTI. UTIs are often helped with antibiotic medicine.  HOME CARE   If given, take  antibiotics as told by your doctor. Finish them even if you start to feel better.  Drink enough fluids to keep your pee (urine) clear or pale yellow.  Avoid tea, drinks with caffeine, and bubbly (carbonated) drinks.  Pee often. Avoid holding your pee in for a long time.  Pee before and after having sex (intercourse).  Wipe from front to back after you poop (bowel movement) if you are a woman. Use each tissue only once. GET HELP RIGHT AWAY IF:   You have back pain.  You have lower belly (abdominal) pain.  You have chills.  You feel sick to your stomach (nauseous).  You throw up (vomit).  Your burning or discomfort with peeing does not go away.  You have a fever.  Your symptoms are not better in 3 days. MAKE SURE YOU:   Understand these instructions.  Will watch your condition.  Will get help right away if you are not doing well or get worse.   This information is not intended to replace advice given to you by your health care provider. Make sure you discuss any questions you have with your health care provider.   Document Released: 04/09/2008 Document Revised: 11/12/2014 Document Reviewed: 05/22/2012 Elsevier Interactive Patient Education [employer].

## 2015-08-10 NOTE — ED Provider Notes (Signed)
[facility] Emergency Department Provider Note  Time seen: 3:57 PM  I have reviewed the triage vital signs and the nursing notes.   HISTORY  Chief Complaint Diarrhea and Urinary Retention    HPI Laurie Steele is a 74 y.o. female with a past medical history of hypertension, hyperlipidemia, end-stage renal disease on peritoneal dialysis, diabetes, presents to the emergency department today with lower back pain, constipation, and being unable to urinate. According to the patient for the past few days she has had lower back pain and constipation. She was seen for the same here in 08/08/15. Had a CT scan showing considerable amount of stool within the colon, but no acutely concerning findings. Patient states she was placed on stool softeners, but is still not had a bowel movement. Today she has not been able to urinate since last night, So she came to the emergency department. Denies any abdominal pain. Denies any nausea or vomiting. States she has had some liquid stool but no solid stool recently. Describes her lower back pain as moderate, constipation as moderate.   Past Medical History  Diagnosis Date  . Hx of colonic polyps   . Hyperlipidemia   . Hypertension   . Renal failure     Dr Holley Raring  . 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 Chronic anemia     Dr Inez Pilgrim  . Glaucoma   . GERD (gastroesophageal reflux disease)   . Gout   . Miscarriage   . Vaginal delivery     x 4    Patient Active Problem List   Diagnosis Date Noted  . Bronchitis 03/03/2012  . Type II or unspecified type diabetes mellitus with renal manifestations, not stated as uncontrolled 12/18/2011  . END STAGE RENAL DISEASE 10/23/2010  . NEUROPATHY 12/13/2009  . GOUT 02/15/2009  . SLEEP DISORDER 02/15/2009  . GERD 02/23/2008   . HYPERLIPIDEMIA 05/14/2007  . ANEMIA, CHRONIC 05/14/2007  . HYPERTENSION 05/14/2007    Past Surgical History  Procedure Laterality Date  . Cholecystectomy    . Abdominal hysterectomy  1983  . Back surgery  6/08    Dr Collier Salina  . Back surgery  1987    disc    Current Outpatient Rx  Name  Route  Sig  Dispense  Refill  . amLODipine (NORVASC) 5 MG tablet   Oral   Take 5 mg by mouth daily.           Marland Kitchen aspirin EC 325 MG tablet   Oral   Take 325 mg by mouth daily.         . B Complex-C-Folic Acid (DIALYVITE [id number] PO)   Oral   Take 1 tablet by mouth daily.         . calcitRIOL (ROCALTROL) 0.5 MCG capsule   Oral   Take 0.5 mcg by mouth daily.           . cinacalcet (SENSIPAR) 90 MG tablet   Oral   Take 90 mg by mouth daily.         . ciprofloxacin (CIPRO) 500 MG tablet   Oral   Take 1 tablet (500 mg total) by mouth 2 (two) times daily.   14 tablet   0   . cloNIDine (CATAPRES) 0.3 MG tablet   Oral  Take 1 tablet (0.3 mg total) by mouth 2 (two) times daily.   60 tablet   11   . docusate sodium (COLACE) 100 MG capsule   Oral   Take 1 capsule (100 mg total) by mouth 2 (two) times daily.   20 capsule   2   . fluticasone (FLONASE) 50 MCG/ACT nasal spray   Each Nare   Place 2 sprays into both nostrils daily.         . hydrALAZINE (APRESOLINE) 50 MG tablet   Oral   Take 50 mg by mouth 3 (three) times daily.           Marland Kitchen HYDROcodone-acetaminophen (NORCO/VICODIN) 5-325 MG tablet   Oral   Take 1 tablet by mouth every 4 (four) hours as needed for moderate pain.   20 tablet   0   . insulin aspart (NOVOLOG) 100 UNIT/ML injection   Subcutaneous   Inject 20-48 Units into the skin 2 (two) times daily. Pt uses 20 units in the morning and 48 units in the evening.         . insulin glargine (LANTUS) 100 UNIT/ML injection   Subcutaneous   Inject 0.8 mLs (80 Units total) into the skin at bedtime. Patient taking differently: Inject 70 Units into the skin  at bedtime.    7 vial   3   . ipratropium (ATROVENT) 0.03 % nasal spray   Each Nare   Place 1 spray into both nostrils 3 (three) times daily as needed for rhinitis.         Marland Kitchen isosorbide mononitrate (IMDUR) 30 MG 24 hr tablet   Oral   Take 30 mg by mouth 2 (two) times daily.         Marland Kitchen latanoprost (XALATAN) 0.005 % ophthalmic solution   Both Eyes   Place 1 drop into both eyes at bedtime.         Marland Kitchen losartan (COZAAR) 50 MG tablet   Oral   Take 50 mg by mouth at bedtime.          . metoCLOPramide (REGLAN) 10 MG tablet   Oral   Take 1 tablet (10 mg total) by mouth every 8 (eight) hours as needed for nausea.   15 tablet   0   . metroNIDAZOLE (FLAGYL) 500 MG tablet   Oral   Take 1 tablet (500 mg total) by mouth 2 (two) times daily after a meal.   14 tablet   0   . multivitamin (RENA-VIT) TABS tablet   Oral   Take 1 tablet by mouth daily.           . ondansetron (ZOFRAN) 8 MG tablet   Oral   Take 8 mg by mouth every 8 (eight) hours as needed for nausea or vomiting.         . pantoprazole (PROTONIX) 40 MG tablet   Oral   Take 40 mg by mouth daily.         . pravastatin (PRAVACHOL) 80 MG tablet   Oral   Take 1 tablet (80 mg total) by mouth daily.   90 tablet   3   . sevelamer (RENVELA) 800 MG tablet   Oral   Take 800 mg by mouth 3 (three) times daily with meals.           . timolol (BETIMOL) 0.5 % ophthalmic solution   Both Eyes   Place 1 drop into both eyes 2 (two) times daily.  Allergies Review of patient's allergies indicates no known allergies.  Family History  Problem Relation Age of Onset  . Heart attack Father   . Hypertension Mother   . Hyperlipidemia Mother   . Coronary artery disease      strong fam hx    Social History Social History  Substance Use Topics  . Smoking status: Former Smoker    Quit date: 11/05/1990  . Smokeless tobacco: Never Used  . Alcohol Use: No    Review of Systems Constitutional: Negative  for fever. Cardiovascular: Negative for chest pain. Respiratory: Negative for shortness of breath. Gastrointestinal: Negative for abdominal pain, nausea or vomiting. Some liquid stool but no solid stool recently. Positive for constipation. Genitourinary: Negative for dysuria. Musculoskeletal:  Moderate lower back pain. Neurological: Negative for headache 10-point ROS otherwise negative.  ____________________________________________   PHYSICAL EXAM:  VITAL SIGNS: ED Triage Vitals  Enc Vitals Group     BP 08/10/15 1303 132/53 mmHg     Pulse Rate 08/10/15 1303 89     Resp --      Temp 08/10/15 1303 98.9 F (37.2 C)     Temp Source 08/10/15 1303 Oral     SpO2 08/10/15 1303 98 %     Weight 08/10/15 1303 215 lb (97.523 kg)     Height 08/10/15 1303 5' 7.5" (1.715 m)     Head Cir --      Peak Flow --      Pain Score 08/10/15 1303 10     Pain Loc --      Pain Edu? --      Excl. in [place]? --     Constitutional: Alert and oriented. Well appearing and in no distress. Eyes: Normal exam ENT   Head: Normocephalic and atraumatic.Marland Kitchen   Mouth/Throat: Mucous membranes are moist. Cardiovascular: Normal rate, regular rhythm. No murmur Respiratory: Normal respiratory effort without tachypnea nor retractions. Breath sounds are clear and equal bilaterally. No wheezes/rales/rhonchi. Gastrointestinal: Soft and nontender. No distention.   Musculoskeletal: Nontender with normal range of motion in all extremities Neurologic:  Normal speech and language. No gross focal neurologic deficits  Psychiatric: Mood and affect are normal. Speech and behavior are normal. ____________________________________________    INITIAL IMPRESSION / ASSESSMENT AND PLAN / ED COURSE  Pertinent labs & imaging results that were available during my care of the patient were reviewed by me and considered in my medical decision making (see chart for details).  Patient with CT scan 08/08/15 showing considerable amount of  stool within the rectum. Patient has constipation ongoing, now with urinary retention. Suspect likely fecal impaction. We'll attempt manual disimpaction, and enema treatment to relieve constipation which in turn should relieve patient's urinary retention. Her labs are largely at her baseline currently. No distress. Nontender abdomen.  Large amount of stool removed by manual disimpaction. Patient tolerated very well. We will dose a soapsuds enema, and monitor for relief.  Urinalysis results showing urinary tract infection. We will be give renally dosed Keflex (250 mg daily).  ------------------------------------------------------------------------------------------------------------------- Fecal Disimpaction Procedure Note:  Performed by me:  Patient placed in the lateral recumbent position with knees drawn towards chest. Nurse present for patient support. Large amount of hard brown stool removed. No complications during procedure.   ------------------------------------------------------------------------------------------------------------------   Patient states some stool after enema, but a lot of liquid. She has been able to urinate a large amount twice per patient. No longer has discomfort. I offered the patient's second enema, she states she would much rather  go home, and try MiraLAX. I discussed with the patient trial of home MiraLAX, follow up with her primary care doctor tomorrow, and return to the emergency department for any abdominal pain, fever, urinary retention, or continued constipation, patient agreeable to plan.  ____________________________________________   FINAL CLINICAL IMPRESSION(S) / ED DIAGNOSES  Constipation Urinary retention Lower back pain   Harvest Dark, MD 08/10/15 2114

## 2015-08-17 ENCOUNTER — Other Ambulatory Visit: Payer: Self-pay | Admitting: Nurse Practitioner

## 2015-08-17 ENCOUNTER — Ambulatory Visit: Payer: 59

## 2015-08-17 DIAGNOSIS — K5900 Constipation, unspecified: Secondary | ICD-10-CM

## 2015-08-18 ENCOUNTER — Ambulatory Visit
Admission: RE | Admit: 2015-08-18 | Discharge: 2015-08-18 | Disposition: A | Discharge status: Home / Self Care | Payer: Medicare Other | Source: Ambulatory Visit | Attending: Nurse Practitioner | Admitting: Nurse Practitioner

## 2015-08-18 DIAGNOSIS — K5900 Constipation, unspecified: Secondary | ICD-10-CM

## 2015-08-18 DIAGNOSIS — K5909 Other constipation: Secondary | ICD-10-CM | POA: Diagnosis not present

## 2015-11-06 HISTORY — PX: INSERTION OF DIALYSIS CATHETER: SHX1324

## 2015-11-22 ENCOUNTER — Other Ambulatory Visit: Payer: Self-pay | Admitting: Internal Medicine

## 2015-11-22 DIAGNOSIS — Z1231 Encounter for screening mammogram for malignant neoplasm of breast: Secondary | ICD-10-CM

## 2015-12-05 ENCOUNTER — Ambulatory Visit
Admission: RE | Admit: 2015-12-05 | Discharge: 2015-12-05 | Disposition: A | Discharge status: Home / Self Care | Payer: Medicare Other | Source: Ambulatory Visit | Attending: Internal Medicine | Admitting: Internal Medicine

## 2015-12-05 ENCOUNTER — Other Ambulatory Visit: Payer: Self-pay | Admitting: Internal Medicine

## 2015-12-05 DIAGNOSIS — Z1231 Encounter for screening mammogram for malignant neoplasm of breast: Secondary | ICD-10-CM

## 2015-12-12 ENCOUNTER — Emergency Department
Admission: EM | Admit: 2015-12-12 | Discharge: 2015-12-12 | Disposition: A | Discharge status: Home / Self Care | Payer: Medicare Other | Attending: Emergency Medicine | Admitting: Emergency Medicine

## 2015-12-12 ENCOUNTER — Encounter: Payer: Self-pay | Admitting: Emergency Medicine

## 2015-12-12 DIAGNOSIS — Z792 Long term (current) use of antibiotics: Secondary | ICD-10-CM | POA: Diagnosis not present

## 2015-12-12 DIAGNOSIS — Z7982 Long term (current) use of aspirin: Secondary | ICD-10-CM | POA: Diagnosis not present

## 2015-12-12 DIAGNOSIS — Z87891 Personal history of nicotine dependence: Secondary | ICD-10-CM | POA: Insufficient documentation

## 2015-12-12 DIAGNOSIS — Z79899 Other long term (current) drug therapy: Secondary | ICD-10-CM | POA: Insufficient documentation

## 2015-12-12 DIAGNOSIS — Z794 Long term (current) use of insulin: Secondary | ICD-10-CM | POA: Diagnosis not present

## 2015-12-12 DIAGNOSIS — Z7951 Long term (current) use of inhaled steroids: Secondary | ICD-10-CM | POA: Diagnosis not present

## 2015-12-12 DIAGNOSIS — Z7984 Long term (current) use of oral hypoglycemic drugs: Secondary | ICD-10-CM | POA: Insufficient documentation

## 2015-12-12 DIAGNOSIS — E13319 Other specified diabetes mellitus with unspecified diabetic retinopathy without macular edema: Secondary | ICD-10-CM | POA: Diagnosis not present

## 2015-12-12 DIAGNOSIS — R42 Dizziness and giddiness: Secondary | ICD-10-CM | POA: Diagnosis present

## 2015-12-12 DIAGNOSIS — E1322 Other specified diabetes mellitus with diabetic chronic kidney disease: Secondary | ICD-10-CM | POA: Diagnosis not present

## 2015-12-12 DIAGNOSIS — I120 Hypertensive chronic kidney disease with stage 5 chronic kidney disease or end stage renal disease: Secondary | ICD-10-CM | POA: Diagnosis not present

## 2015-12-12 DIAGNOSIS — E1321 Other specified diabetes mellitus with diabetic nephropathy: Secondary | ICD-10-CM | POA: Diagnosis not present

## 2015-12-12 DIAGNOSIS — N186 End stage renal disease: Secondary | ICD-10-CM | POA: Diagnosis not present

## 2015-12-12 DIAGNOSIS — I10 Essential (primary) hypertension: Secondary | ICD-10-CM

## 2015-12-12 LAB — COMPREHENSIVE METABOLIC PANEL
ALK PHOS: 92 U/L (ref 38–126)
ALT: 29 U/L (ref 14–54)
AST: 27 U/L (ref 15–41)
Albumin: 3.5 g/dL (ref 3.5–5.0)
Anion gap: 17 — ABNORMAL HIGH (ref 5–15)
BILIRUBIN TOTAL: 0.4 mg/dL (ref 0.3–1.2)
BUN: 63 mg/dL — ABNORMAL HIGH (ref 6–20)
CALCIUM: 8.9 mg/dL (ref 8.9–10.3)
CO2: 23 mmol/L (ref 22–32)
CREATININE: 9.87 mg/dL — AB (ref 0.44–1.00)
Chloride: 100 mmol/L — ABNORMAL LOW (ref 101–111)
GFR, EST AFRICAN AMERICAN: 4 mL/min — AB (ref >60)
GFR, EST NON AFRICAN AMERICAN: 3 mL/min — AB (ref >60)
Glucose, Bld: 154 mg/dL — ABNORMAL HIGH (ref 65–99)
Potassium: 3.7 mmol/L (ref 3.5–5.1)
Sodium: 140 mmol/L (ref 135–145)
TOTAL PROTEIN: 7.3 g/dL (ref 6.5–8.1)

## 2015-12-12 LAB — TROPONIN I: TROPONIN I: 0.03 ng/mL (ref <0.031)

## 2015-12-12 LAB — CBC
HEMATOCRIT: 30 % — AB (ref 35.0–47.0)
HEMOGLOBIN: 9.9 g/dL — AB (ref 12.0–16.0)
MCH: 35.3 pg — AB (ref 26.0–34.0)
MCHC: 33 g/dL (ref 32.0–36.0)
MCV: 107.1 fL — AB (ref 80.0–100.0)
PLATELETS: 252 10*3/uL (ref 150–440)
RBC: 2.8 MIL/uL — AB (ref 3.80–5.20)
RDW: 15.9 % — ABNORMAL HIGH (ref 11.5–14.5)
WBC: 13.6 10*3/uL — ABNORMAL HIGH (ref 3.6–11.0)

## 2015-12-12 NOTE — ED Notes (Signed)
Pt discharged to home.  Family member driving.  Discharge instructions reviewed.  Verbalized understanding.  No questions or concerns at this time.  Teach back verified.  Pt in NAD.  No items left in ED.   

## 2015-12-12 NOTE — Discharge Instructions (Signed)
Hypertension Hypertension, commonly called high blood pressure, is when the force of blood pumping through your arteries is too strong. Your arteries are the blood vessels that carry blood from your heart throughout your body. A blood pressure reading consists of a higher number over a lower number, such as 110/72. The higher number (systolic) is the pressure inside your arteries when your heart pumps. The lower number (diastolic) is the pressure inside your arteries when your heart relaxes. Ideally you want your blood pressure below 120/80. Hypertension forces your heart to work harder to pump blood. Your arteries may become narrow or stiff. Having untreated or uncontrolled hypertension can cause heart attack, stroke, kidney disease, and other problems. RISK FACTORS Some risk factors for high blood pressure are controllable. Others are not.  Risk factors you cannot control include:   Race. You may be at higher risk if you are African American.  Age. Risk increases with age.  Gender. Men are at higher risk than women before age 45 years. After age 65, women are at higher risk than men. Risk factors you can control include:  Not getting enough exercise or physical activity.  Being overweight.  Getting too much fat, sugar, calories, or salt in your diet.  Drinking too much alcohol. SIGNS AND SYMPTOMS Hypertension does not usually cause signs or symptoms. Extremely high blood pressure (hypertensive crisis) may cause headache, anxiety, shortness of breath, and nosebleed. DIAGNOSIS To check if you have hypertension, your health care provider will measure your blood pressure while you are seated, with your arm held at the level of your heart. It should be measured at least twice using the same arm. Certain conditions can cause a difference in blood pressure between your right and left arms. A blood pressure reading that is higher than normal on one occasion does not mean that you need treatment. If  it is not clear whether you have high blood pressure, you may be asked to return on a different day to have your blood pressure checked again. Or, you may be asked to monitor your blood pressure at home for 1 or more weeks. TREATMENT Treating high blood pressure includes making lifestyle changes and possibly taking medicine. Living a healthy lifestyle can help lower high blood pressure. You may need to change some of your habits. Lifestyle changes may include:  Following the DASH diet. This diet is high in fruits, vegetables, and whole grains. It is low in salt, red meat, and added sugars.  Keep your sodium intake below 2,300 mg per day.  Getting at least 30-45 minutes of aerobic exercise at least 4 times per week.  Losing weight if necessary.  Not smoking.  Limiting alcoholic beverages.  Learning ways to reduce stress. Your health care provider may prescribe medicine if lifestyle changes are not enough to get your blood pressure under control, and if one of the following is true:  You are 18-59 years of age and your systolic blood pressure is above 140.  You are 60 years of age or older, and your systolic blood pressure is above 150.  Your diastolic blood pressure is above 90.  You have diabetes, and your systolic blood pressure is over 140 or your diastolic blood pressure is over 90.  You have kidney disease and your blood pressure is above 140/90.  You have heart disease and your blood pressure is above 140/90. Your personal target blood pressure may vary depending on your medical conditions, your age, and other factors. HOME CARE INSTRUCTIONS    Have your blood pressure rechecked as directed by your health care provider.   Take medicines only as directed by your health care provider. Follow the directions carefully. Blood pressure medicines must be taken as prescribed. The medicine does not work as well when you skip doses. Skipping doses also puts you at risk for  problems.  Do not smoke.   Monitor your blood pressure at home as directed by your health care provider. SEEK MEDICAL CARE IF:   You think you are having a reaction to medicines taken.  You have recurrent headaches or feel dizzy.  You have swelling in your ankles.  You have trouble with your vision. SEEK IMMEDIATE MEDICAL CARE IF:  You develop a severe headache or confusion.  You have unusual weakness, numbness, or feel faint.  You have severe chest or abdominal pain.  You vomit repeatedly.  You have trouble breathing. MAKE SURE YOU:   Understand these instructions.  Will watch your condition.  Will get help right away if you are not doing well or get worse.   This information is not intended to replace advice given to you by your health care provider. Make sure you discuss any questions you have with your health care provider.   Document Released: 10/22/2005 Document Revised: 03/08/2015 Document Reviewed: 08/14/2013 Elsevier Interactive Patient Education 2016 Elsevier Inc.  

## 2015-12-12 NOTE — ED Notes (Signed)
Pt reports vomit x 1 today and HTN at hoime "200 over something" pt reports dizziness.  Denies pain, TIA hx, or other s/sx.  Pt reports taking BP meds at home but denies vomiting the meds.

## 2015-12-12 NOTE — ED Provider Notes (Signed)
[facility] Emergency Department Provider Note  ____________________________________________   I have reviewed the triage vital signs and the nursing notes.   HISTORY  Chief Complaint Dizziness and Hypertension    HPI Laurie Steele is a 75 y.o. female who presents with complaints of high blood pressure and a brief period of dizziness today. Patient does have a history of end-stage renal disease and hypertension which is typically well controlled with medications. However today she wasn't feeling quite like herself and decided to take her blood pressure which was elevated. She attempted to rest for half an hour and recheck it and it was higher. She did become briefly dizzy at this point but denies chest pain or syncope. In the emergency department she feels completely well and has no symptoms     Past Medical History  Diagnosis Date  . Hx of colonic polyps   . Hyperlipidemia   . Hypertension   . Renal failure     Dr Holley Raring  . 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 Chronic anemia     Dr Inez Pilgrim  . Glaucoma   . GERD (gastroesophageal reflux disease)   . Gout   . Miscarriage   . Vaginal delivery     x 4    Patient Active Problem List   Diagnosis Date Noted  . Bronchitis 03/03/2012  . Type II or unspecified type diabetes mellitus with renal manifestations, not stated as uncontrolled 12/18/2011  . END STAGE RENAL DISEASE 10/23/2010  . NEUROPATHY 12/13/2009  . GOUT 02/15/2009  . SLEEP DISORDER 02/15/2009  . GERD 02/23/2008  . HYPERLIPIDEMIA 05/14/2007  . ANEMIA, CHRONIC 05/14/2007  . HYPERTENSION 05/14/2007    Past Surgical History  Procedure Laterality Date  . Cholecystectomy    . Abdominal hysterectomy  1983  . Back surgery  6/08    Dr Collier Salina  . Back surgery  1987    disc     Current Outpatient Rx  Name  Route  Sig  Dispense  Refill  . amLODipine (NORVASC) 5 MG tablet   Oral   Take 5 mg by mouth daily.           Marland Kitchen aspirin EC 325 MG tablet   Oral   Take 325 mg by mouth daily.         . B Complex-C-Folic Acid (DIALYVITE Q000111Q PO)   Oral   Take 1 tablet by mouth daily.         . calcitRIOL (ROCALTROL) 0.5 MCG capsule   Oral   Take 0.5 mcg by mouth daily.           . cinacalcet (SENSIPAR) 90 MG tablet   Oral   Take 90 mg by mouth daily.         . ciprofloxacin (CIPRO) 500 MG tablet   Oral   Take 1 tablet (500 mg total) by mouth 2 (two) times daily.   14 tablet   0   . cloNIDine (CATAPRES) 0.3 MG tablet   Oral   Take 1 tablet (0.3 mg total) by mouth 2 (two) times daily.   60 tablet   11   . docusate sodium (COLACE) 100 MG capsule   Oral   Take 1 capsule (100 mg total) by mouth 2 (two) times daily.  20 capsule   2   . fluticasone (FLONASE) 50 MCG/ACT nasal spray   Each Nare   Place 2 sprays into both nostrils daily.         . hydrALAZINE (APRESOLINE) 50 MG tablet   Oral   Take 50 mg by mouth 3 (three) times daily.           Marland Kitchen HYDROcodone-acetaminophen (NORCO/VICODIN) 5-325 MG tablet   Oral   Take 1 tablet by mouth every 4 (four) hours as needed for moderate pain.   20 tablet   0   . insulin aspart (NOVOLOG) 100 UNIT/ML injection   Subcutaneous   Inject 20-48 Units into the skin 2 (two) times daily. Pt uses 20 units in the morning and 48 units in the evening.         . insulin glargine (LANTUS) 100 UNIT/ML injection   Subcutaneous   Inject 0.8 mLs (80 Units total) into the skin at bedtime. Patient taking differently: Inject 70 Units into the skin at bedtime.    7 vial   3   . ipratropium (ATROVENT) 0.03 % nasal spray   Each Nare   Place 1 spray into both nostrils 3 (three) times daily as needed for rhinitis.         Marland Kitchen isosorbide mononitrate (IMDUR) 30 MG 24 hr tablet   Oral   Take 30 mg by mouth 2  (two) times daily.         Marland Kitchen latanoprost (XALATAN) 0.005 % ophthalmic solution   Both Eyes   Place 1 drop into both eyes at bedtime.         Marland Kitchen losartan (COZAAR) 50 MG tablet   Oral   Take 50 mg by mouth at bedtime.          . metoCLOPramide (REGLAN) 10 MG tablet   Oral   Take 1 tablet (10 mg total) by mouth every 8 (eight) hours as needed for nausea.   15 tablet   0   . metroNIDAZOLE (FLAGYL) 500 MG tablet   Oral   Take 1 tablet (500 mg total) by mouth 2 (two) times daily after a meal.   14 tablet   0   . multivitamin (RENA-VIT) TABS tablet   Oral   Take 1 tablet by mouth daily.           . ondansetron (ZOFRAN) 8 MG tablet   Oral   Take 8 mg by mouth every 8 (eight) hours as needed for nausea or vomiting.         . pantoprazole (PROTONIX) 40 MG tablet   Oral   Take 40 mg by mouth daily.         . pravastatin (PRAVACHOL) 80 MG tablet   Oral   Take 1 tablet (80 mg total) by mouth daily.   90 tablet   3   . sevelamer (RENVELA) 800 MG tablet   Oral   Take 800 mg by mouth 3 (three) times daily with meals.           . timolol (BETIMOL) 0.5 % ophthalmic solution   Both Eyes   Place 1 drop into both eyes 2 (two) times daily.           Allergies Review of patient's allergies indicates no known allergies.  Family History  Problem Relation Age of Onset  . Heart attack Father   . Hypertension Mother   . Hyperlipidemia Mother   . Coronary artery disease  strong fam hx  . Breast cancer Neg Hx     Social History Social History  Substance Use Topics  . Smoking status: Former Smoker    Quit date: 11/05/1990  . Smokeless tobacco: Never Used  . Alcohol Use: No    Review of Systems  Constitutional: Negative for fever. Dizziness earlier today Eyes: Negative for visual changes. ENT: Negative for sore throat Cardiovascular: Negative for chest pain. Respiratory: Negative for shortness of breath. Negative for cough Gastrointestinal: Negative  for abdominal pain  Musculoskeletal: Negative for back pain. Skin: Negative for rash. Neurological: Negative for headaches or focal weakness Psychiatric: No anxiety    ____________________________________________   PHYSICAL EXAM:  VITAL SIGNS: ED Triage Vitals  Enc Vitals Group     BP 12/12/15 1913 189/70 mmHg     Pulse Rate 12/12/15 1913 85     Resp 12/12/15 1913 18     Temp 12/12/15 1913 98.1 F (36.7 C)     Temp Source 12/12/15 1913 Oral     SpO2 12/12/15 1913 95 %     Weight 12/12/15 1913 210 lb (95.255 kg)     Height 12/12/15 1913 5\' 7"  (1.702 m)     Head Cir --      Peak Flow --      Pain Score --      Pain Loc --      Pain Edu? --      Excl. in [place]? --      Constitutional: Alert and oriented. Well appearing and in no distress. Eyes: Conjunctivae are normal.  ENT   Head: Normocephalic and atraumatic.   Mouth/Throat: Mucous membranes are moist. Cardiovascular: Normal rate, regular rhythm. Normal and symmetric distal pulses are present in all extremities. No murmurs, rubs, or gallops. Respiratory: Normal respiratory effort without tachypnea nor retractions. Breath sounds are clear and equal bilaterally.   Genitourinary: deferred Musculoskeletal: Nontender with normal range of motion in all extremities. No lower extremity tenderness nor edema. Neurologic:  Normal speech and language. No gross focal neurologic deficits are appreciated. Skin:  Skin is warm, dry and intact. No rash noted. Psychiatric: Mood and affect are normal. Patient exhibits appropriate insight and judgment.  ____________________________________________    LABS (pertinent positives/negatives)  Labs Reviewed  CBC - Abnormal; Notable for the following:    WBC 13.6 (*)    RBC 2.80 (*)    Hemoglobin 9.9 (*)    HCT 30.0 (*)    MCV 107.1 (*)    MCH 35.3 (*)    RDW 15.9 (*)    All other components within normal limits  COMPREHENSIVE METABOLIC PANEL - Abnormal; Notable for the  following:    Chloride 100 (*)    Glucose, Bld 154 (*)    BUN 63 (*)    Creatinine, Ser 9.87 (*)    GFR calc non Af Amer 3 (*)    GFR calc Af Amer 4 (*)    Anion gap 17 (*)    All other components within normal limits  TROPONIN I    ____________________________________________   EKG  ED ECG REPORT I, Lavonia Drafts, the attending physician, personally viewed and interpreted this ECG.  Date: 12/12/2015 EKG Time: 7:41 PM Rate: 76 Rhythm: normal sinus rhythm QRS Axis: normal Intervals: normal ST/T Wave abnormalities: normal Conduction Disturbances: none Narrative Interpretation: Occasional PVCs   ____________________________________________    RADIOLOGY I have personally reviewed any xrays that were ordered on this patient: None  ____________________________________________   PROCEDURES  Procedure(s)  performed: none  Critical Care performed: none  ____________________________________________   INITIAL IMPRESSION / ASSESSMENT AND PLAN / ED COURSE  Pertinent labs & imaging results that were available during my care of the patient were reviewed by me and considered in my medical decision making (see chart for details).  Patient with asymptomatic high blood pressure. Her lab work is unchanged from prior. Her exam is benign. She has no infectious etiology. Her EKG is reassuring. Because she feels well and has not had any symptoms during the last 2 hours I feel outpatient follow-up is appropriate. She will call her nephrologist to recheck her blood pressure this week and adjust her medications as needed  ____________________________________________   FINAL CLINICAL IMPRESSION(S) / ED DIAGNOSES  Final diagnoses:  Essential hypertension     Lavonia Drafts, MD 12/13/15 0000

## 2016-03-14 ENCOUNTER — Inpatient Hospital Stay
Admission: EM | Admit: 2016-03-14 | Discharge: 2016-03-16 | DRG: 640 | Disposition: A | Discharge status: Home / Self Care | Payer: Medicare Other | Attending: Internal Medicine | Admitting: Internal Medicine

## 2016-03-14 ENCOUNTER — Encounter: Payer: Self-pay | Admitting: Emergency Medicine

## 2016-03-14 DIAGNOSIS — D631 Anemia in chronic kidney disease: Secondary | ICD-10-CM | POA: Diagnosis present

## 2016-03-14 DIAGNOSIS — Z79899 Other long term (current) drug therapy: Secondary | ICD-10-CM

## 2016-03-14 DIAGNOSIS — K219 Gastro-esophageal reflux disease without esophagitis: Secondary | ICD-10-CM | POA: Diagnosis present

## 2016-03-14 DIAGNOSIS — H409 Unspecified glaucoma: Secondary | ICD-10-CM | POA: Diagnosis present

## 2016-03-14 DIAGNOSIS — Z794 Long term (current) use of insulin: Secondary | ICD-10-CM

## 2016-03-14 DIAGNOSIS — E785 Hyperlipidemia, unspecified: Secondary | ICD-10-CM | POA: Diagnosis present

## 2016-03-14 DIAGNOSIS — Z87891 Personal history of nicotine dependence: Secondary | ICD-10-CM

## 2016-03-14 DIAGNOSIS — E1122 Type 2 diabetes mellitus with diabetic chronic kidney disease: Secondary | ICD-10-CM | POA: Diagnosis present

## 2016-03-14 DIAGNOSIS — N186 End stage renal disease: Secondary | ICD-10-CM | POA: Diagnosis present

## 2016-03-14 DIAGNOSIS — E876 Hypokalemia: Secondary | ICD-10-CM | POA: Diagnosis present

## 2016-03-14 DIAGNOSIS — M109 Gout, unspecified: Secondary | ICD-10-CM | POA: Diagnosis present

## 2016-03-14 DIAGNOSIS — Z992 Dependence on renal dialysis: Secondary | ICD-10-CM | POA: Diagnosis not present

## 2016-03-14 DIAGNOSIS — Z7982 Long term (current) use of aspirin: Secondary | ICD-10-CM | POA: Diagnosis not present

## 2016-03-14 DIAGNOSIS — E11319 Type 2 diabetes mellitus with unspecified diabetic retinopathy without macular edema: Secondary | ICD-10-CM | POA: Diagnosis present

## 2016-03-14 DIAGNOSIS — B349 Viral infection, unspecified: Secondary | ICD-10-CM | POA: Diagnosis present

## 2016-03-14 DIAGNOSIS — I120 Hypertensive chronic kidney disease with stage 5 chronic kidney disease or end stage renal disease: Secondary | ICD-10-CM | POA: Diagnosis present

## 2016-03-14 DIAGNOSIS — Z7984 Long term (current) use of oral hypoglycemic drugs: Secondary | ICD-10-CM

## 2016-03-14 DIAGNOSIS — Z86010 Personal history of colonic polyps: Secondary | ICD-10-CM

## 2016-03-14 DIAGNOSIS — Z8249 Family history of ischemic heart disease and other diseases of the circulatory system: Secondary | ICD-10-CM | POA: Diagnosis not present

## 2016-03-14 HISTORY — DX: Dependence on renal dialysis: Z99.2

## 2016-03-14 LAB — COMPREHENSIVE METABOLIC PANEL
ALK PHOS: 83 U/L (ref 38–126)
ALT: 25 U/L (ref 14–54)
ANION GAP: 14 (ref 5–15)
AST: 23 U/L (ref 15–41)
Albumin: 3.8 g/dL (ref 3.5–5.0)
BILIRUBIN TOTAL: 0.5 mg/dL (ref 0.3–1.2)
BUN: 56 mg/dL — ABNORMAL HIGH (ref 6–20)
CALCIUM: 13.4 mg/dL — AB (ref 8.9–10.3)
CO2: 27 mmol/L (ref 22–32)
Chloride: 97 mmol/L — ABNORMAL LOW (ref 101–111)
Creatinine, Ser: 11.44 mg/dL — ABNORMAL HIGH (ref 0.44–1.00)
GFR, EST AFRICAN AMERICAN: 3 mL/min — AB (ref >60)
GFR, EST NON AFRICAN AMERICAN: 3 mL/min — AB (ref >60)
GLUCOSE: 150 mg/dL — AB (ref 65–99)
POTASSIUM: 3.2 mmol/L — AB (ref 3.5–5.1)
Sodium: 138 mmol/L (ref 135–145)
TOTAL PROTEIN: 7.5 g/dL (ref 6.5–8.1)

## 2016-03-14 LAB — URINALYSIS COMPLETE WITH MICROSCOPIC (ARMC ONLY)
BILIRUBIN URINE: NEGATIVE
Bacteria, UA: NONE SEEN
GLUCOSE, UA: 50 mg/dL — AB
HGB URINE DIPSTICK: NEGATIVE
KETONES UR: NEGATIVE mg/dL
NITRITE: NEGATIVE
Protein, ur: 100 mg/dL — AB
RBC / HPF: NONE SEEN RBC/hpf (ref 0–5)
Specific Gravity, Urine: 1.014 (ref 1.005–1.030)
pH: 5 (ref 5.0–8.0)

## 2016-03-14 LAB — CBC
HCT: 34.8 % — ABNORMAL LOW (ref 35.0–47.0)
HEMOGLOBIN: 11.4 g/dL — AB (ref 12.0–16.0)
MCH: 35.3 pg — AB (ref 26.0–34.0)
MCHC: 32.9 g/dL (ref 32.0–36.0)
MCV: 107.5 fL — AB (ref 80.0–100.0)
Platelets: 258 10*3/uL (ref 150–440)
RBC: 3.24 MIL/uL — ABNORMAL LOW (ref 3.80–5.20)
RDW: 16.3 % — AB (ref 11.5–14.5)
WBC: 14.1 10*3/uL — ABNORMAL HIGH (ref 3.6–11.0)

## 2016-03-14 LAB — CALCIUM: CALCIUM: 12.8 mg/dL — AB (ref 8.9–10.3)

## 2016-03-14 LAB — MAGNESIUM: Magnesium: 2.2 mg/dL (ref 1.7–2.4)

## 2016-03-14 MED ORDER — LATANOPROST 0.005 % OP SOLN
1.0000 [drp] | Freq: Every day | OPHTHALMIC | Status: DC
  Administered 2016-03-16: 1 [drp] via OPHTHALMIC
  Filled 2016-03-14: qty 2.5

## 2016-03-14 MED ORDER — AMLODIPINE BESYLATE 5 MG PO TABS
5.0000 mg | ORAL_TABLET | Freq: Every day | ORAL | Status: DC
  Administered 2016-03-15 – 2016-03-16 (×2): 5 mg via ORAL
  Filled 2016-03-14 (×2): qty 1

## 2016-03-14 MED ORDER — TIMOLOL MALEATE 0.5 % OP SOLN: 1.0000 [drp] | Freq: Two times a day (BID) | OPHTHALMIC | Status: DC

## 2016-03-14 MED ORDER — DOCUSATE SODIUM 100 MG PO CAPS
100.0000 mg | ORAL_CAPSULE | Freq: Two times a day (BID) | ORAL | Status: DC
  Administered 2016-03-15 – 2016-03-16 (×4): 100 mg via ORAL
  Filled 2016-03-14 (×4): qty 1

## 2016-03-14 MED ORDER — CLONIDINE HCL 0.1 MG PO TABS
0.3000 mg | ORAL_TABLET | Freq: Two times a day (BID) | ORAL | Status: DC
  Administered 2016-03-15 – 2016-03-16 (×4): 0.3 mg via ORAL
  Filled 2016-03-14 (×4): qty 3

## 2016-03-14 MED ORDER — ACETAMINOPHEN 325 MG PO TABS: 650.0000 mg | ORAL_TABLET | Freq: Four times a day (QID) | ORAL | Status: DC | PRN

## 2016-03-14 MED ORDER — ACETAMINOPHEN 650 MG RE SUPP: 650.0000 mg | Freq: Four times a day (QID) | RECTAL | Status: DC | PRN

## 2016-03-14 MED ORDER — SODIUM CHLORIDE 0.9% FLUSH
3.0000 mL | Freq: Two times a day (BID) | INTRAVENOUS | Status: DC
  Administered 2016-03-15 (×2): 3 mL via INTRAVENOUS

## 2016-03-14 MED ORDER — ONDANSETRON 4 MG PO TBDP
4.0000 mg | ORAL_TABLET | Freq: Once | ORAL | Status: AC
  Administered 2016-03-14: 4 mg via ORAL

## 2016-03-14 MED ORDER — PRAVASTATIN SODIUM 20 MG PO TABS
80.0000 mg | ORAL_TABLET | Freq: Every day | ORAL | Status: DC
  Administered 2016-03-15 – 2016-03-16 (×2): 80 mg via ORAL
  Filled 2016-03-14 (×2): qty 4

## 2016-03-14 MED ORDER — POTASSIUM CHLORIDE CRYS ER 20 MEQ PO TBCR
40.0000 meq | EXTENDED_RELEASE_TABLET | Freq: Once | ORAL | Status: AC
Start: 2016-03-14 — End: 2016-03-15
  Administered 2016-03-15: 40 meq via ORAL
  Filled 2016-03-14: qty 2

## 2016-03-14 MED ORDER — INSULIN ASPART 100 UNIT/ML ~~LOC~~ SOLN
0.0000 [IU] | Freq: Every day | SUBCUTANEOUS | Status: DC
  Administered 2016-03-15: 2 [IU] via SUBCUTANEOUS
  Filled 2016-03-14: qty 2

## 2016-03-14 MED ORDER — BISACODYL 5 MG PO TBEC: 5.0000 mg | DELAYED_RELEASE_TABLET | Freq: Every day | ORAL | Status: DC | PRN

## 2016-03-14 MED ORDER — PANTOPRAZOLE SODIUM 40 MG PO TBEC
40.0000 mg | DELAYED_RELEASE_TABLET | Freq: Every day | ORAL | Status: DC
  Administered 2016-03-15 – 2016-03-16 (×2): 40 mg via ORAL
  Filled 2016-03-14 (×2): qty 1

## 2016-03-14 MED ORDER — RENA-VITE PO TABS
1.0000 | ORAL_TABLET | Freq: Every day | ORAL | Status: DC
  Administered 2016-03-15 – 2016-03-16 (×2): 1 via ORAL
  Filled 2016-03-14 (×2): qty 1

## 2016-03-14 MED ORDER — HEPARIN SODIUM (PORCINE) 5000 UNIT/ML IJ SOLN
5000.0000 [IU] | Freq: Three times a day (TID) | INTRAMUSCULAR | Status: DC
  Administered 2016-03-15 – 2016-03-16 (×4): 5000 [IU] via SUBCUTANEOUS
  Filled 2016-03-14 (×5): qty 1

## 2016-03-14 MED ORDER — SODIUM CHLORIDE 0.9 % IV SOLN
INTRAVENOUS | Status: AC
  Administered 2016-03-14: 22:00:00 via INTRAVENOUS

## 2016-03-14 MED ORDER — ONDANSETRON HCL 4 MG PO TABS: 8.0000 mg | ORAL_TABLET | Freq: Three times a day (TID) | ORAL | Status: DC | PRN

## 2016-03-14 MED ORDER — TRAZODONE HCL 50 MG PO TABS: 25.0000 mg | ORAL_TABLET | Freq: Every evening | ORAL | Status: DC | PRN

## 2016-03-14 MED ORDER — ONDANSETRON HCL 4 MG/2ML IJ SOLN: 4.0000 mg | Freq: Four times a day (QID) | INTRAMUSCULAR | Status: DC | PRN

## 2016-03-14 MED ORDER — SODIUM CHLORIDE 0.9 % IV SOLN
INTRAVENOUS | Status: DC
  Administered 2016-03-15 (×2): via INTRAVENOUS

## 2016-03-14 MED ORDER — INSULIN ASPART 100 UNIT/ML ~~LOC~~ SOLN: 0.0000 [IU] | Freq: Three times a day (TID) | SUBCUTANEOUS | Status: DC

## 2016-03-14 MED ORDER — FUROSEMIDE 10 MG/ML IJ SOLN
40.0000 mg | Freq: Once | INTRAMUSCULAR | Status: AC
  Administered 2016-03-14: 40 mg via INTRAVENOUS
  Filled 2016-03-14: qty 4

## 2016-03-14 MED ORDER — ONDANSETRON HCL 4 MG PO TABS: 4.0000 mg | ORAL_TABLET | Freq: Four times a day (QID) | ORAL | Status: DC | PRN

## 2016-03-14 MED ORDER — ASPIRIN EC 325 MG PO TBEC
325.0000 mg | DELAYED_RELEASE_TABLET | Freq: Every day | ORAL | Status: DC
  Administered 2016-03-15 – 2016-03-16 (×2): 325 mg via ORAL
  Filled 2016-03-14 (×2): qty 1

## 2016-03-14 MED ORDER — LOSARTAN POTASSIUM 50 MG PO TABS
50.0000 mg | ORAL_TABLET | Freq: Every day | ORAL | Status: DC
  Administered 2016-03-15 (×2): 50 mg via ORAL
  Filled 2016-03-14 (×2): qty 1

## 2016-03-14 NOTE — ED Provider Notes (Signed)
[facility] Emergency Department Provider Note   ____________________________________________  Time seen: Approximately [id number] PM  I have reviewed the triage vital signs and the nursing notes.   HISTORY  Chief Complaint Emesis   HPI Laurie Steele is a 75 y.o. female with a history of renal failure on peritoneal dialysis was presenting to the emergency department today with nausea vomiting and forearm and hand cramping. She denies any pain. Denies any chest pain or shortness of breath. Says that she is compliant with her dialysis.   Past Medical History  Diagnosis Date  . Hx of colonic polyps   . Hyperlipidemia   . Hypertension   . Renal failure     Dr Holley Raring  . 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 Chronic anemia     Dr Inez Pilgrim  . Glaucoma   . GERD (gastroesophageal reflux disease)   . Gout   . Miscarriage   . Vaginal delivery     x 4  . Peritoneal dialysis status [facility])     Patient Active Problem List   Diagnosis Date Noted  . Bronchitis 03/03/2012  . Type II or unspecified type diabetes mellitus with renal manifestations, not stated as uncontrolled 12/18/2011  . END STAGE RENAL DISEASE 10/23/2010  . NEUROPATHY 12/13/2009  . GOUT 02/15/2009  . SLEEP DISORDER 02/15/2009  . GERD 02/23/2008  . HYPERLIPIDEMIA 05/14/2007  . ANEMIA, CHRONIC 05/14/2007  . HYPERTENSION 05/14/2007    Past Surgical History  Procedure Laterality Date  . Cholecystectomy    . Abdominal hysterectomy  1983  . Back surgery  6/08    Dr Collier Salina  . Back surgery  1987    disc    Current Outpatient Rx  Name  Route  Sig  Dispense  Refill  . amLODipine (NORVASC) 5 MG tablet   Oral   Take 5 mg by mouth daily.           Marland Kitchen aspirin EC 325 MG tablet   Oral   Take 325 mg by mouth daily.         . B Complex-C-Folic Acid (DIALYVITE Q000111Q PO)   Oral   Take 1 tablet by mouth daily.         . calcitRIOL (ROCALTROL) 0.5 MCG capsule   Oral   Take 0.5 mcg by mouth daily.           . cinacalcet (SENSIPAR) 90 MG tablet   Oral   Take 90 mg by mouth daily.         . ciprofloxacin (CIPRO) 500 MG tablet   Oral   Take 1 tablet (500 mg total) by mouth 2 (two) times daily.   14 tablet   0   . cloNIDine (CATAPRES) 0.3 MG tablet   Oral   Take 1 tablet (0.3 mg total) by mouth 2 (two) times daily.   60 tablet   11   . docusate sodium (COLACE) 100 MG capsule   Oral   Take 1 capsule (100 mg total) by mouth 2 (two) times daily.   20 capsule   2   . fluticasone (FLONASE) 50 MCG/ACT nasal spray   Each Nare   Place 2 sprays into both nostrils daily.         . hydrALAZINE (APRESOLINE) 50 MG  tablet   Oral   Take 50 mg by mouth 3 (three) times daily.           Marland Kitchen HYDROcodone-acetaminophen (NORCO/VICODIN) 5-325 MG tablet   Oral   Take 1 tablet by mouth every 4 (four) hours as needed for moderate pain.   20 tablet   0   . insulin aspart (NOVOLOG) 100 UNIT/ML injection   Subcutaneous   Inject 20-48 Units into the skin 2 (two) times daily. Pt uses 20 units in the morning and 48 units in the evening.         . insulin glargine (LANTUS) 100 UNIT/ML injection   Subcutaneous   Inject 0.8 mLs (80 Units total) into the skin at bedtime. Patient taking differently: Inject 70 Units into the skin at bedtime.    7 vial   3   . ipratropium (ATROVENT) 0.03 % nasal spray   Each Nare   Place 1 spray into both nostrils 3 (three) times daily as needed for rhinitis.         Marland Kitchen isosorbide mononitrate (IMDUR) 30 MG 24 hr tablet   Oral   Take 30 mg by mouth 2 (two) times daily.         Marland Kitchen latanoprost (XALATAN) 0.005 % ophthalmic solution   Both Eyes   Place 1 drop into both eyes at bedtime.         Marland Kitchen losartan (COZAAR) 50 MG tablet   Oral   Take 50 mg by mouth at bedtime.           . metoCLOPramide (REGLAN) 10 MG tablet   Oral   Take 1 tablet (10 mg total) by mouth every 8 (eight) hours as needed for nausea.   15 tablet   0   . metroNIDAZOLE (FLAGYL) 500 MG tablet   Oral   Take 1 tablet (500 mg total) by mouth 2 (two) times daily after a meal.   14 tablet   0   . multivitamin (RENA-VIT) TABS tablet   Oral   Take 1 tablet by mouth daily.           . ondansetron (ZOFRAN) 8 MG tablet   Oral   Take 8 mg by mouth every 8 (eight) hours as needed for nausea or vomiting.         . pantoprazole (PROTONIX) 40 MG tablet   Oral   Take 40 mg by mouth daily.         . pravastatin (PRAVACHOL) 80 MG tablet   Oral   Take 1 tablet (80 mg total) by mouth daily.   90 tablet   3   . sevelamer (RENVELA) 800 MG tablet   Oral   Take 800 mg by mouth 3 (three) times daily with meals.           . timolol (BETIMOL) 0.5 % ophthalmic solution   Both Eyes   Place 1 drop into both eyes 2 (two) times daily.           Allergies Review of patient's allergies indicates no known allergies.  Family History  Problem Relation Age of Onset  . Heart attack Father   . Hypertension Mother   . Hyperlipidemia Mother   . Coronary artery disease      strong fam hx  . Breast cancer Neg Hx     Social History Social History  Substance Use Topics  . Smoking status: Former Smoker    Quit date: 11/05/1990  . Smokeless tobacco:  Never Used  . Alcohol Use: No    Review of Systems Constitutional: No fever/chills Eyes: No visual changes. ENT: No sore throat. Cardiovascular: Denies chest pain. Respiratory: Denies shortness of breath. Gastrointestinal: No abdominal pain.    No diarrhea.  No constipation. Genitourinary: Negative for dysuria. Musculoskeletal: Negative for back pain. Skin: Negative for rash. Neurological: Negative for headaches, focal weakness or numbness.  10-point ROS otherwise negative.  ____________________________________________   PHYSICAL  EXAM:  VITAL SIGNS: ED Triage Vitals  Enc Vitals Group     BP 03/14/16 2014 153/59 mmHg     Pulse Rate 03/14/16 2014 90     Resp 03/14/16 2014 18     Temp 03/14/16 2014 98.3 F (36.8 C)     Temp Source 03/14/16 2014 Oral     SpO2 03/14/16 2014 95 %     Weight 03/14/16 2013 210 lb (95.255 kg)     Height 03/14/16 2013 5\' 7"  (1.702 m)     Head Cir --      Peak Flow --      Pain Score --      Pain Loc --      Pain Edu? --      Excl. in [place]? --     Constitutional: Alert and oriented. Well appearing and in no acute distress. Eyes: Conjunctivae are normal. PERRL. EOMI. Head: Atraumatic. Nose: No congestion/rhinnorhea. Mouth/Throat: Mucous membranes are moist.   Neck: No stridor.   Cardiovascular: Normal rate, regular rhythm. Grossly normal heart sounds.   Respiratory: Normal respiratory effort.  No retractions. Lungs CTAB. Gastrointestinal: Soft and nontender. No distention.Side around the peritoneal dialysis catheter is clean dry and intact without any surrounding induration or pus. Musculoskeletal: No lower extremity tenderness nor edema.  No joint effusions. Neurologic:  Normal speech and language. No gross focal neurologic deficits are appreciated.  Skin:  Skin is warm, dry and intact. No rash noted. Psychiatric: Mood and affect are normal. Speech and behavior are normal.  ____________________________________________   LABS (all labs ordered are listed, but only abnormal results are displayed)  Labs Reviewed  CBC - Abnormal; Notable for the following:    WBC 14.1 (*)    RBC 3.24 (*)    Hemoglobin 11.4 (*)    HCT 34.8 (*)    MCV 107.5 (*)    MCH 35.3 (*)    RDW 16.3 (*)    All other components within normal limits  COMPREHENSIVE METABOLIC PANEL - Abnormal; Notable for the following:    Potassium 3.2 (*)    Chloride 97 (*)    Glucose, Bld 150 (*)    BUN 56 (*)    Creatinine, Ser 11.44 (*)    Calcium 13.4 (*)    GFR calc non Af Amer 3 (*)    GFR calc Af Amer 3 (*)     All other components within normal limits  URINALYSIS COMPLETEWITH MICROSCOPIC ([facility] ONLY) - Abnormal; Notable for the following:    Color, Urine YELLOW (*)    APPearance CLEAR (*)    Glucose, UA 50 (*)    Protein, ur 100 (*)    Leukocytes, UA TRACE (*)    Squamous Epithelial / LPF 0-5 (*)    All other components within normal limits   ____________________________________________  EKG   ____________________________________________  RADIOLOGY   ____________________________________________   PROCEDURES   ____________________________________________   INITIAL IMPRESSION / ASSESSMENT AND PLAN / ED COURSE  Pertinent labs & imaging results that were available during my  care of the patient were reviewed by me and considered in my medical decision making (see chart for details).  ----------------------------------------- 9:54 PM on 03/14/2016 -----------------------------------------  Discussed case with Dr. Karlyne Greenspan of the renal service who recommends fluids as well as Lasix. The patient does make urine. We will be putting the patient in the hospital. Dr. Karlyne Greenspan also says the patient will be dialyzed in the morning.  The patient as well as her son is at bedside. They're understanding of what to comply. Signed out to Dr. Manuella Ghazi.   ____________________________________________   FINAL CLINICAL IMPRESSION(S) / ED DIAGNOSES  Symptomatic hypercalcemia.    NEW MEDICATIONS STARTED DURING THIS VISIT:  New Prescriptions   No medications on file     Note:  This document was prepared using Dragon voice recognition software and may include unintentional dictation errors.    Orbie Pyo, MD 03/14/16 2155

## 2016-03-14 NOTE — ED Notes (Signed)
Critical calcium 13.4, MD made aware

## 2016-03-14 NOTE — H&P (Addendum)
[place] [facility] [place] NAME: Laurie Steele    MR#:  [id number]  DATE OF BIRTH:  16-Aug-1941  DATE OF ADMISSION:  03/14/2016  PRIMARY CARE PHYSICIAN: [name], MD   REQUESTING/REFERRING PHYSICIAN: Orbie Pyo, MD  CHIEF COMPLAINT:   Chief Complaint  Patient presents with  . Emesis    HISTORY OF PRESENT ILLNESS:  Laurie Steele  is a 75 y.o. female with a known history of Hypertension, diabetes, on peritoneal dialysis is being admitted for symptomatic hypercalcemia.  Patient started having vomiting today. She reports at least 4-5 episodes and unable to keep anything down. She also reports forearm and hand cramping for which she decided to come to the emergency department.  While in the ED she was noted to have calcium of 13.44 for which ED physician discussed with nephrology who recommended admission. PAST MEDICAL HISTORY:   Past Medical History  Diagnosis Date  . Hx of colonic polyps   . Hyperlipidemia   . Hypertension   . Renal failure     Dr Holley Raring  . 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 Chronic anemia     Dr Inez Pilgrim  . Glaucoma   . GERD (gastroesophageal reflux disease)   . Gout   . Miscarriage   . Vaginal delivery     x 4  . Peritoneal dialysis status ([place])     PAST SURGICAL HISTORY:   Past Surgical History  Procedure Laterality Date  . Cholecystectomy    . Abdominal hysterectomy  1983  . Back surgery  6/08    Dr Collier Salina  . Back surgery  1987    disc    SOCIAL HISTORY:   Social History  Substance Use Topics  . Smoking status: Former Smoker    Quit date: 11/05/1990  . Smokeless tobacco: Never Used  . Alcohol Use: No    FAMILY HISTORY:   Family History  Problem Relation Age of Onset  . Heart attack Father   .  Hypertension Mother   . Hyperlipidemia Mother   . Coronary artery disease      strong fam hx  . Breast cancer Neg Hx     DRUG ALLERGIES:  No Known Allergies  REVIEW OF SYSTEMS:   Review of Systems  Constitutional: Negative for fever, weight loss, malaise/fatigue and diaphoresis.  HENT: Negative for ear discharge, ear pain, hearing loss, nosebleeds, sore throat and tinnitus.   Eyes: Negative for blurred vision and pain.  Respiratory: Negative for cough, hemoptysis, shortness of breath and wheezing.   Cardiovascular: Negative for chest pain, palpitations, orthopnea and leg swelling.  Gastrointestinal: Positive for nausea and vomiting. Negative for heartburn, abdominal pain, diarrhea, constipation and blood in stool.  Genitourinary: Negative for dysuria, urgency and frequency.  Musculoskeletal: Positive for myalgias. Negative for back pain.  Skin: Negative for itching and rash.  Neurological: Negative for dizziness, tingling, tremors, focal weakness, seizures, weakness and headaches.  Psychiatric/Behavioral: Negative for depression. The patient is not nervous/anxious.    MEDICATIONS AT HOME:   Prior to Admission medications   Medication Sig Start Date End Date Taking? Authorizing Provider  amLODipine (NORVASC) 5 MG tablet Take 5 mg by mouth daily.      Historical Provider, MD  aspirin EC 325 MG tablet Take 325 mg  by mouth daily.    Historical Provider, MD  B Complex-C-Folic Acid (DIALYVITE Q000111Q PO) Take 1 tablet by mouth daily.    Historical Provider, MD  calcitRIOL (ROCALTROL) 0.5 MCG capsule Take 0.5 mcg by mouth daily.      Historical Provider, MD  cinacalcet (SENSIPAR) 90 MG tablet Take 90 mg by mouth daily.    Historical Provider, MD  ciprofloxacin (CIPRO) 500 MG tablet Take 1 tablet (500 mg total) by mouth 2 (two) times daily. 08/08/15   Lavonia Drafts, MD  cloNIDine (CATAPRES) 0.3 MG tablet Take 1 tablet (0.3 mg total) by mouth 2 (two) times daily. 07/14/12   Venia Carbon, MD   docusate sodium (COLACE) 100 MG capsule Take 1 capsule (100 mg total) by mouth 2 (two) times daily. 08/08/15 08/07/16  Lavonia Drafts, MD  fluticasone (FLONASE) 50 MCG/ACT nasal spray Place 2 sprays into both nostrils daily.    Historical Provider, MD  hydrALAZINE (APRESOLINE) 50 MG tablet Take 50 mg by mouth 3 (three) times daily.      Historical Provider, MD  HYDROcodone-acetaminophen (NORCO/VICODIN) 5-325 MG tablet Take 1 tablet by mouth every 4 (four) hours as needed for moderate pain. 08/08/15   Lavonia Drafts, MD  insulin aspart (NOVOLOG) 100 UNIT/ML injection Inject 20-48 Units into the skin 2 (two) times daily. Pt uses 20 units in the morning and 48 units in the evening.    Historical Provider, MD  insulin glargine (LANTUS) 100 UNIT/ML injection Inject 0.8 mLs (80 Units total) into the skin at bedtime. Patient taking differently: Inject 70 Units into the skin at bedtime.  05/28/13   Venia Carbon, MD  ipratropium (ATROVENT) 0.03 % nasal spray Place 1 spray into both nostrils 3 (three) times daily as needed for rhinitis.    Historical Provider, MD  isosorbide mononitrate (IMDUR) 30 MG 24 hr tablet Take 30 mg by mouth 2 (two) times daily.    Historical Provider, MD  latanoprost (XALATAN) 0.005 % ophthalmic solution Place 1 drop into both eyes at bedtime.    Historical Provider, MD  losartan (COZAAR) 50 MG tablet Take 50 mg by mouth at bedtime.     Historical Provider, MD  metoCLOPramide (REGLAN) 10 MG tablet Take 1 tablet (10 mg total) by mouth every 8 (eight) hours as needed for nausea. 08/01/15 07/31/16  Loney Hering, MD  metroNIDAZOLE (FLAGYL) 500 MG tablet Take 1 tablet (500 mg total) by mouth 2 (two) times daily after a meal. 08/08/15   Lavonia Drafts, MD  multivitamin (RENA-VIT) TABS tablet Take 1 tablet by mouth daily.      Historical Provider, MD  ondansetron (ZOFRAN) 8 MG tablet Take 8 mg by mouth every 8 (eight) hours as needed for nausea or vomiting.    Historical Provider, MD   pantoprazole (PROTONIX) 40 MG tablet Take 40 mg by mouth daily.    Historical Provider, MD  pravastatin (PRAVACHOL) 80 MG tablet Take 1 tablet (80 mg total) by mouth daily. 01/27/14   Venia Carbon, MD  sevelamer (RENVELA) 800 MG tablet Take 800 mg by mouth 3 (three) times daily with meals.      Historical Provider, MD  timolol (BETIMOL) 0.5 % ophthalmic solution Place 1 drop into both eyes 2 (two) times daily.    Historical Provider, MD      VITAL SIGNS:  Blood pressure 172/69, pulse 80, temperature 98.3 F (36.8 C), temperature source Oral, resp. rate 19, height 5\' 7"  (1.702 m), weight 95.255 kg (210 lb),  SpO2 99 %. PHYSICAL EXAMINATION:  Physical Exam  Constitutional: She is oriented to person, place, and time and well-developed, well-nourished, and in no distress.  HENT:  Head: Normocephalic and atraumatic.  Eyes: Conjunctivae and EOM are normal. Pupils are equal, round, and reactive to light.  Neck: Normal range of motion. Neck supple. No tracheal deviation present. No thyromegaly present.  Cardiovascular: Normal rate, regular rhythm and normal heart sounds.   Pulmonary/Chest: Effort normal and breath sounds normal. No respiratory distress. She has no wheezes. She exhibits no tenderness.  Abdominal: Soft. Bowel sounds are normal. She exhibits no distension. There is no tenderness.  Musculoskeletal: Normal range of motion.  Neurological: She is alert and oriented to person, place, and time. No cranial nerve deficit.  Skin: Skin is warm and dry. No rash noted.  Psychiatric: Mood and affect normal.   LABORATORY PANEL:   CBC  Recent Labs Lab 03/14/16 2018  WBC 14.1*  HGB 11.4*  HCT 34.8*  PLT 258   ------------------------------------------------------------------------------------------------------------------  Chemistries   Recent Labs Lab 03/14/16 2018  NA 138  K 3.2*  CL 97*  CO2 27  GLUCOSE 150*  BUN 56*  CREATININE 11.44*  CALCIUM 13.4*  AST 23  ALT 25   ALKPHOS 25  BILITOT 0.5   IMPRESSION AND PLAN:  75 yo female with hypertension, diabetes on peritoneal dialysis is being admitted for symptomatic hypercalcemia  * Hypercalcemia - Unknown etiology at this time - Could be drug-induced or due to dehydration  - We will hold calcitriol, give IV hydration and recheck in the morning - Monitor her on off unit telemetry - will check again STAT and also request ionized calcium - check Intact PTH to determine further etiology  * Hypokalemia - Replete and recheck - Could be due to vomiting  * Nausea & Vomiting - Symptomatic management - Likely viral infection - IV hydration  * Diabetes mellitus - Sugar is running around 150 so hold off her insulin, Lantus and home dose of diabetic medication, just keep her on sliding scale insulin at this time.   - Check hemoglobin A1c  * ESRD on PD - Nephro c/s for PD need    All the records are reviewed and case discussed with ED provider. Management plans discussed with the patient, family and they are in agreement.  CODE STATUS: Full code  TOTAL TIME TAKING CARE OF THIS PATIENT: 45 minutes.    [facility], Raiden Haydu M.D on 03/14/2016 at 10:56 PM  Between 7am to 6pm - Pager - [phone]  After 6pm go to [email or web address] - password [email or web address] [place] [facility]  Office  [phone]  CC: Primary care physician; [name], MD   Note: This dictation was prepared with Dragon dictation along with smaller phrase technology. Any transcriptional errors that result from this process are unintentional.

## 2016-03-14 NOTE — ED Notes (Signed)
Pt in with co vomiting since yest denies any abd pain or diarrhea states has had same symptoms in the past with unknown cause.

## 2016-03-15 LAB — CBC
HCT: 28.8 % — ABNORMAL LOW (ref 35.0–47.0)
HEMOGLOBIN: 9.6 g/dL — AB (ref 12.0–16.0)
MCH: 36 pg — AB (ref 26.0–34.0)
MCHC: 33.3 g/dL (ref 32.0–36.0)
MCV: 108.1 fL — ABNORMAL HIGH (ref 80.0–100.0)
PLATELETS: 185 10*3/uL (ref 150–440)
RBC: 2.67 MIL/uL — ABNORMAL LOW (ref 3.80–5.20)
RDW: 16.6 % — ABNORMAL HIGH (ref 11.5–14.5)
WBC: 11.5 10*3/uL — ABNORMAL HIGH (ref 3.6–11.0)

## 2016-03-15 LAB — BASIC METABOLIC PANEL
Anion gap: 10 (ref 5–15)
BUN: 56 mg/dL — ABNORMAL HIGH (ref 6–20)
CALCIUM: 11.5 mg/dL — AB (ref 8.9–10.3)
CHLORIDE: 98 mmol/L — AB (ref 101–111)
CO2: 29 mmol/L (ref 22–32)
CREATININE: 11.13 mg/dL — AB (ref 0.44–1.00)
GFR calc Af Amer: 3 mL/min — ABNORMAL LOW (ref >60)
GFR calc non Af Amer: 3 mL/min — ABNORMAL LOW (ref >60)
GLUCOSE: 62 mg/dL — AB (ref 65–99)
Potassium: 3.5 mmol/L (ref 3.5–5.1)
Sodium: 137 mmol/L (ref 135–145)

## 2016-03-15 LAB — GLUCOSE, CAPILLARY
GLUCOSE-CAPILLARY: 48 mg/dL — AB (ref 65–99)
GLUCOSE-CAPILLARY: 96 mg/dL (ref 65–99)
GLUCOSE-CAPILLARY: 232 mg/dL — AB (ref 65–99)
Glucose-Capillary: 59 mg/dL — ABNORMAL LOW (ref 65–99)
Glucose-Capillary: 89 mg/dL (ref 65–99)
Glucose-Capillary: 90 mg/dL (ref 65–99)
Glucose-Capillary: 91 mg/dL (ref 65–99)

## 2016-03-15 LAB — HEMOGLOBIN A1C: HEMOGLOBIN A1C: 5.6 % (ref 4.0–6.0)

## 2016-03-15 MED ORDER — GENTAMICIN SULFATE 0.1 % EX CREA
1.0000 "application " | TOPICAL_CREAM | Freq: Every day | CUTANEOUS | Status: DC
  Administered 2016-03-15 (×2): 1 via TOPICAL
  Filled 2016-03-15: qty 15

## 2016-03-15 MED ORDER — NEPRO/CARBSTEADY PO LIQD
237.0000 mL | ORAL | Status: DC
  Administered 2016-03-16: 237 mL via ORAL

## 2016-03-15 MED ORDER — HEPARIN 1000 UNIT/ML FOR PERITONEAL DIALYSIS
500.0000 [IU] | INTRAMUSCULAR | Status: DC | PRN
  Filled 2016-03-15: qty 0.5

## 2016-03-15 MED ORDER — DELFLEX-LC/1.5% DEXTROSE 346 MOSM/L IP SOLN
INTRAPERITONEAL | Status: DC
  Administered 2016-03-15: 17:00:00 via INTRAPERITONEAL
  Filled 2016-03-15 (×2): qty 3000

## 2016-03-15 NOTE — Progress Notes (Signed)
Pre-peritoneal dialysis

## 2016-03-15 NOTE — Progress Notes (Signed)
Initial Nutrition Assessment   INTERVENTION:   -Cater to pt preferences on Heart healthy/Carb Modified Diet Order -Recommend Nepro Shake po daily, each supplement provides 425 kcal and 19 grams protein   NUTRITION DIAGNOSIS:   Inadequate oral intake related to acute illness as evidenced by per patient/family report.  GOAL:   Patient will meet greater than or equal to 90% of their needs  MONITOR:   PO intake, Supplement acceptance, Labs, I & O's, Weight trends  REASON FOR ASSESSMENT:   Malnutrition Screening Tool    ASSESSMENT:   Pt admitted with emesis 4-5 times yesterday and hypercalcemia. Pt with h/o ESRD on PD.   Past Medical History  Diagnosis Date  . Hx of colonic polyps   . Hyperlipidemia   . Hypertension   . Renal failure     Dr Holley Raring  . 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 Chronic anemia     Dr Inez Pilgrim  . Glaucoma   . GERD (gastroesophageal reflux disease)   . Gout   . Miscarriage   . Vaginal delivery     x 4  . Peritoneal dialysis status [facility])     Diet Order:  Diet heart healthy/carb modified Room service appropriate?: Yes; Fluid consistency:: Thin   Pt reports having a low blood sugar this morning and thus drinking 2 apple juices and having breakfast consisting of eggs and [place] sausage. Pt reports not getting to drink her coffee as she had had so much juice. Pt reported tolerating well this morning.  Pt reports her appetite has been progressively worsening over the past week PTA. Pt reports yesterday having some breakfast but then vomitting up twice that morning. Pt reports trying to eat some fruit for lunch however with the same result. Pt reports usually she has a good appetite and eats 3 meals per day sometimes with snacks and reports drinking Nepro in the morning. Pt reports knowing  that she needs to eat good sources of protein, which she feels she has been doing well and pt reports seeing the dietitian at [facility] once monthly.  Medications: Colace, SS novolog, Renal MVI, Protonix  Labs: Electrolyte/Renal Profile and Glucose Profile:   Recent Labs Lab 03/14/16 2018 03/14/16 2317 03/15/16 0410  NA 138  --  137  K 3.2*  --  3.5  CL 97*  --  98*  CO2 27  --  29  BUN 56*  --  56*  CREATININE 11.44*  --  11.13*  CALCIUM 13.4* 12.8* 11.5*  MG  --  2.2  --   GLUCOSE 150*  --  62*   Gastrointestinal Profile: Last BM: 03/14/2016   Nutrition-Focused Physical Exam Findings: Nutrition-Focused physical exam completed. Findings are WDL for fat depletion, muscle depletion, and edema.     Weight Change: Pt reports her weight fluctuates depending on the dialysate she uses. Pt reports weighing herself every morning after her PD run. Pt reports the highest weight in the am has been 215-216lbs and her lowest was 208-209lbs.   Skin:  Reviewed, no issues   Height:   Ht Readings from Last 1 Encounters:  03/14/16 5\' 7"  (1.702 m)    Weight:   Wt Readings from Last 1 Encounters:  03/15/16 216 lb 8 oz (98.204 kg)   Wt Readings from Last 10  Encounters:  03/15/16 216 lb 8 oz (98.204 kg)  12/12/15 210 lb (95.255 kg)  08/10/15 215 lb (97.523 kg)  08/08/15 215 lb (97.523 kg)  07/31/15 215 lb (97.523 kg)  07/13/15 213 lb (96.616 kg)  03/03/12 223 lb 4 oz (101.266 kg)  12/18/11 224 lb (101.606 kg)  06/22/11 221 lb (100.245 kg)  02/20/11 229 lb (103.874 kg)    BMI:  Body mass index is 33.9 kg/(m^2).  Estimated Nutritional Needs:   Kcal:  2350-2850kcals  Protein:  117-127g protein  Fluid:  2L +UOP  EDUCATION NEEDS:   Education needs no appropriate at this time   Dwyane Luo, RD, LDN Pager [phone] Weekend/On-Call Pager [phone]

## 2016-03-15 NOTE — Progress Notes (Signed)
Subjective:   Patient known to our practice from outpatient.  She is followed by Dr. Holley Raring for peritoneal dialysis She states that she presents for complaint of nausea which started at home.no cough, sputum, fevers or chills. She reports generalized weakness. Upon admission, she was noted to have severe hypercalcemia of 13.4.   Today's level has improved to 11.5 with IV hydration Patient denies taking TUMS, calcium supplements.  Outpatient, she was on calcitriol   Objective:  Vital signs in last 24 hours:  Temp:  [98 F (36.7 C)-98.3 F (36.8 C)] 98.1 F (36.7 C) (05/11 1412) Pulse Rate:  [56-90] 60 (05/11 1412) Resp:  [14-19] 15 (05/11 1412) BP: (122-211)/(48-78) 122/48 mmHg (05/11 1412) SpO2:  [94 %-99 %] 98 % (05/11 1412) Weight:  [95.255 kg (210 lb)-98.204 kg (216 lb 8 oz)] 98.204 kg (216 lb 8 oz) (05/11 0500)  Weight change:  Filed Weights   03/14/16 2013 03/14/16 2358 03/15/16 0500  Weight: 95.255 kg (210 lb) 98.204 kg (216 lb 8 oz) 98.204 kg (216 lb 8 oz)    Intake/Output:    Intake/Output Summary (Last 24 hours) at 03/15/16 1548 Last data filed at 03/15/16 1300  Gross per 24 hour  Intake 1136.16 ml  Output      0 ml  Net 1136.16 ml     Physical Exam: General: No acute distress, laying in the bed  HEENT anicteric, moist mucous membranes  Neck supple  Pulm/lungs Clear auscultation bilaterally  CVS/Heart Regular rhythm, no rub or gallop  Abdomen:  Soft, nontender  Extremities: Trace peripheral edema  Neurologic: Alert, oriented  Skin: No acute rashes  Access: PD catheter, exit site nontender       Basic Metabolic Panel:   Recent Labs Lab 03/14/16 2018 03/14/16 2317 03/15/16 0410  NA 138  --  137  K 3.2*  --  3.5  CL 97*  --  98*  CO2 27  --  29  GLUCOSE 150*  --  62*  BUN 56*  --  56*  CREATININE 11.44*  --  11.13*  CALCIUM 13.4* 12.8* 11.5*  MG  --  2.2  --      CBC:  Recent Labs Lab 03/14/16 2018 03/15/16 0410  WBC 14.1* 11.5*   HGB 11.4* 9.6*  HCT 34.8* 28.8*  MCV 107.5* 108.1*  PLT 258 185      Microbiology:  No results found for this or any previous visit (from the past 720 hour(s)).  Coagulation Studies: No results for input(s): LABPROT, INR in the last 72 hours.  Urinalysis:  Recent Labs  03/14/16 2044  COLORURINE YELLOW*  LABSPEC 1.014  PHURINE 5.0  GLUCOSEU 50*  HGBUR NEGATIVE  BILIRUBINUR NEGATIVE  KETONESUR NEGATIVE  PROTEINUR 100*  NITRITE NEGATIVE  LEUKOCYTESUR TRACE*      Imaging: No results found.   Medications:     . amLODipine  5 mg Oral Daily  . aspirin EC  325 mg Oral Daily  . cloNIDine  0.3 mg Oral BID  . dialysis solution 1.5% low-MG/low-CA   Intraperitoneal Q24H  . docusate sodium  100 mg Oral BID  . [START ON 03/16/2016] feeding supplement (NEPRO CARB STEADY)  237 mL Oral Q24H  . gentamicin cream  1 application Topical Daily  . heparin  5,000 Units Subcutaneous Q8H  . insulin aspart  0-15 Units Subcutaneous TID WC  . insulin aspart  0-5 Units Subcutaneous QHS  . latanoprost  1 drop Both Eyes QHS  . losartan  50  mg Oral QHS  . multivitamin  1 tablet Oral Daily  . pantoprazole  40 mg Oral QAC breakfast  . pravastatin  80 mg Oral Daily  . sodium chloride flush  3 mL Intravenous Q12H   acetaminophen **OR** acetaminophen, bisacodyl, heparin, ondansetron **OR** ondansetron (ZOFRAN) IV, ondansetron, traZODone  Assessment/ Plan:  75 y.o. female with end-stage renal disease, hypertension, diabetes presents for symptomatic hypercalcemia  1.  End-stage renal disease We will continue her outpatient PD prescription out 1700 cc x 6 cycles. Use 1.5% solution  2.  Hypercalcemia Hold calcitriol Agree with IV hydration Avoid calcium supplements Obtain SPEP, UPEP  3.anemia chronic kidney disease Hemoglobin 9.6, MCV high at 108.1 Agree with rena-vite     LOS: 1 Camren Lipsett 5/11/20173:48 PM

## 2016-03-15 NOTE — Progress Notes (Signed)
Patient alert and oriented. Blood sugar was 48, asymtomatic, two cups apple juice given . rechecked blood sugar was 90. Patient is eating breakfast right now. We wili continue to monitor.

## 2016-03-15 NOTE — Progress Notes (Signed)
Connected to peritoneal dialysis cycler. 

## 2016-03-15 NOTE — Progress Notes (Signed)
[place] at [place] NAME: Laurie Steele    MR#:  [id number]  DATE OF BIRTH:  1941/01/30  SUBJECTIVE:  CHIEF COMPLAINT:   Chief Complaint  Patient presents with  . Emesis   No nausea today, had Calcium very high, with IV fluids now 11.5. She is worried as her abdomen is getting bigger and should get Peritoenal dialysis soon.  REVIEW OF SYSTEMS:  CONSTITUTIONAL: No fever, fatigue or weakness.  EYES: No blurred or double vision.  EARS, NOSE, AND THROAT: No tinnitus or ear pain.  RESPIRATORY: No cough, shortness of breath, wheezing or hemoptysis.  CARDIOVASCULAR: No chest pain, orthopnea, edema.  GASTROINTESTINAL: No nausea, vomiting, diarrhea or abdominal pain.  GENITOURINARY: No dysuria, hematuria.  ENDOCRINE: No polyuria, nocturia,  HEMATOLOGY: No anemia, easy bruising or bleeding SKIN: No rash or lesion. MUSCULOSKELETAL: No joint pain or arthritis.   NEUROLOGIC: No tingling, numbness, weakness.  PSYCHIATRY: No anxiety or depression.   ROS  DRUG ALLERGIES:  No Known Allergies  VITALS:  Blood pressure 122/48, pulse 60, temperature 98.1 F (36.7 C), temperature source Oral, resp. rate 15, height 5\' 7"  (1.702 m), weight 98.204 kg (216 lb 8 oz), SpO2 98 %.  PHYSICAL EXAMINATION:  GENERAL:  75 y.o.-year-old patient lying in the bed with no acute distress.  EYES: Pupils equal, round, reactive to light and accommodation. No scleral icterus. Extraocular muscles intact.  HEENT: Head atraumatic, normocephalic. Oropharynx and nasopharynx clear.  NECK:  Supple, no jugular venous distention. No thyroid enlargement, no tenderness.  LUNGS: Normal breath sounds bilaterally, no wheezing, rales,rhonchi or crepitation. No use of accessory muscles of respiration.  CARDIOVASCULAR: S1, S2 normal. No murmurs, rubs, or gallops.  ABDOMEN: Soft, nontender, nondistended. Bowel sounds present. No organomegaly or mass. PD catheter in place. EXTREMITIES: No  pedal edema, cyanosis, or clubbing.  NEUROLOGIC: Cranial nerves II through XII are intact. Muscle strength 5/5 in all extremities. Sensation intact. Gait not checked.  PSYCHIATRIC: The patient is alert and oriented x 3.  SKIN: No obvious rash, lesion, or ulcer.   Physical Exam LABORATORY PANEL:   CBC  Recent Labs Lab 03/15/16 0410  WBC 11.5*  HGB 9.6*  HCT 28.8*  PLT 185   ------------------------------------------------------------------------------------------------------------------  Chemistries   Recent Labs Lab 03/14/16 2018 03/14/16 2317 03/15/16 0410  NA 138  --  137  K 3.2*  --  3.5  CL 97*  --  98*  CO2 27  --  29  GLUCOSE 150*  --  62*  BUN 56*  --  56*  CREATININE 11.44*  --  11.13*  CALCIUM 13.4* 12.8* 11.5*  MG  --  2.2  --   AST 23  --   --   ALT 25  --   --   ALKPHOS 83  --   --   BILITOT 0.5  --   --    ------------------------------------------------------------------------------------------------------------------  Cardiac Enzymes No results for input(s): TROPONINI in the last 168 hours. ------------------------------------------------------------------------------------------------------------------  RADIOLOGY:  No results found.  ASSESSMENT AND PLAN:   Active Problems:   Hypercalcemia  * Hypercalcemia - Unknown etiology at this time- likely calcitriol use as out pt. - We will hold calcitriol, give IV hydration  - Monitor her on off unit telemetry - check Intact PTH to determine further etiology -Appreciated nephrology help.  * Hypokalemia - Replete and recheck - Could be due to vomiting  * Nausea & Vomiting - Symptomatic management - Likely viral infection -  IV hydration  * Diabetes mellitus - Sugar is running around 150 so hold off her insulin, Lantus and home dose of diabetic medication, just keep her on sliding scale insulin at this time.  - Check hemoglobin A1c  * ESRD on PD - Nephro c/s for PD need    All the  records are reviewed and case discussed with Care Management/Social Workerr. Management plans discussed with the patient, family and they are in agreement.  CODE STATUS: Full.  TOTAL TIME TAKING CARE OF THIS PATIENT: 35 minutes.   POSSIBLE D/C IN 1-2 DAYS, DEPENDING ON CLINICAL CONDITION.   Vaughan Basta M.D on 03/15/2016   Between 7am to 6pm - Pager - [phone]  After 6pm go to [email or web address] - password [email or web address] [facility]  Office  [phone]  CC: Primary care physician; [name], MD  Note: This dictation was prepared with Dragon dictation along with smaller phrase technology. Any transcriptional errors that result from this process are unintentional.

## 2016-03-15 NOTE — Progress Notes (Signed)
Inpatient Diabetes Program Recommendations  AACE/ADA: New Consensus Statement on Inpatient Glycemic Control (2015)  Target Ranges:  Prepandial:   less than 140 mg/dL      Peak postprandial:   less than 180 mg/dL (1-2 hours)      Critically ill patients:  140 - 180 mg/dL  Results for Laurie Steele, Laurie Steele (MRN [id number]) as of 03/15/2016 09:14  Ref. Range 03/15/2016 01:02 03/15/2016 01:26 03/15/2016 07:52 03/15/2016 08:14  Glucose-Capillary Latest Ref Range: 65-99 mg/dL 59 (L) 89 48 (L) 90   Review of Glycemic Control  Diabetes history: DM2 Outpatient Diabetes medications: Lantus 70 units QHS, Novolog 20 units with breakfast, Novolog 48 units with supper Current orders for Inpatient glycemic control: Novolog 0-15 units TID with meals, Novolog 0-5 units QHS  Inpatient Diabetes Program Recommendations: Inpatient Consult: Patient is followed by Dr. Gabriel Carina as an outpatient. Please consider consulting Dr. Gabriel Carina for assistance with inpatient glycemic control.  NOTE: Patient has not received any insulin since being admitted to the hospital. However, she has experienced 2 episodes of hypoglycemia since being admitted. Recommend consulting Dr. Gabriel Carina for assistance with inpatient glycemic management.  Thanks, Barnie Alderman, RN, MSN, CDE Diabetes Coordinator Inpatient Diabetes Program [phone] (Team Pager from [place] to [place]) [phone] ([facility] office) [phone] [facility] office) [phone] [facility] office)

## 2016-03-16 LAB — PROTEIN ELECTROPHORESIS, SERUM
A/G RATIO SPE: 1.2 (ref 0.7–1.7)
ALBUMIN ELP: 2.9 g/dL (ref 2.9–4.4)
ALPHA-1-GLOBULIN: 0.2 g/dL (ref 0.0–0.4)
ALPHA-2-GLOBULIN: 0.8 g/dL (ref 0.4–1.0)
BETA GLOBULIN: 0.7 g/dL (ref 0.7–1.3)
GLOBULIN, TOTAL: 2.4 g/dL (ref 2.2–3.9)
Gamma Globulin: 0.8 g/dL (ref 0.4–1.8)
Total Protein ELP: 5.3 g/dL — ABNORMAL LOW (ref 6.0–8.5)

## 2016-03-16 LAB — PROTEIN ELECTRO, RANDOM URINE
ALBUMIN ELP UR: 44.9 %
ALPHA-1-GLOBULIN, U: 9.2 %
ALPHA-2-GLOBULIN, U: 9.6 %
Beta Globulin, U: 22.8 %
GAMMA GLOBULIN, U: 13.5 %
Total Protein, Urine: 137.2 mg/dL

## 2016-03-16 LAB — BASIC METABOLIC PANEL
Anion gap: 9 (ref 5–15)
BUN: 56 mg/dL — AB (ref 6–20)
CHLORIDE: 100 mmol/L — AB (ref 101–111)
CO2: 29 mmol/L (ref 22–32)
Calcium: 10.7 mg/dL — ABNORMAL HIGH (ref 8.9–10.3)
Creatinine, Ser: 10.76 mg/dL — ABNORMAL HIGH (ref 0.44–1.00)
GFR calc Af Amer: 4 mL/min — ABNORMAL LOW (ref >60)
GFR calc non Af Amer: 3 mL/min — ABNORMAL LOW (ref >60)
Glucose, Bld: 121 mg/dL — ABNORMAL HIGH (ref 65–99)
POTASSIUM: 3.6 mmol/L (ref 3.5–5.1)
SODIUM: 138 mmol/L (ref 135–145)

## 2016-03-16 LAB — PARATHYROID HORMONE, INTACT (NO CA)
PTH: 83 pg/mL — ABNORMAL HIGH (ref 15–65)
PTH: 111 pg/mL — ABNORMAL HIGH (ref 15–65)

## 2016-03-16 LAB — GLUCOSE, CAPILLARY
GLUCOSE-CAPILLARY: 104 mg/dL — AB (ref 65–99)
GLUCOSE-CAPILLARY: 82 mg/dL (ref 65–99)

## 2016-03-16 LAB — CALCIUM, IONIZED: Calcium, Ionized, Serum: 7.3 mg/dL — ABNORMAL HIGH (ref 4.5–5.6)

## 2016-03-16 NOTE — Progress Notes (Signed)
Subjective:   Patient known to our practice from outpatient.  She is followed by Dr. Holley Raring for peritoneal dialysis She states that she presents for complaint of nausea which started at home.no cough, sputum, fevers or chills. She reports generalized weakness. Upon admission, she was noted to have severe hypercalcemia of 13.4.   Today's level has improved to 10.7  with IV hydration Patient denies taking TUMS, calcium supplements.  Outpatient, she was on calcitriol Feels better this morning.  Ready for discharge   Objective:  Vital signs in last 24 hours:  Temp:  [98.1 F (36.7 C)-98.3 F (36.8 C)] 98.3 F (36.8 C) (05/11 2223) Pulse Rate:  [60-65] 65 (05/11 2223) Resp:  [15-16] 16 (05/11 2223) BP: (122-163)/(48-64) 163/64 mmHg (05/11 2223) SpO2:  [98 %-100 %] 100 % (05/11 2223) Weight:  [100.472 kg (221 lb 8 oz)] 100.472 kg (221 lb 8 oz) (05/12 0300)  Weight change: 5.216 kg (11 lb 8 oz) Filed Weights   03/14/16 2358 03/15/16 0500 03/16/16 0300  Weight: 98.204 kg (216 lb 8 oz) 98.204 kg (216 lb 8 oz) 100.472 kg (221 lb 8 oz)    Intake/Output:    Intake/Output Summary (Last 24 hours) at 03/16/16 1031 Last data filed at 03/16/16 0921  Gross per 24 hour  Intake    540 ml  Output    351 ml  Net    189 ml     Physical Exam: General: No acute distress, laying in the bed  HEENT anicteric, moist mucous membranes  Neck supple  Pulm/lungs Clear auscultation bilaterally  CVS/Heart Regular rhythm, no rub or gallop  Abdomen:  Soft, nontender  Extremities: Trace peripheral edema  Neurologic: Alert, oriented  Skin: No acute rashes  Access: PD catheter, exit site nontender       Basic Metabolic Panel:   Recent Labs Lab 03/14/16 2018 03/14/16 2317 03/15/16 0410 03/16/16 0412  NA 138  --  137 138  K 3.2*  --  3.5 3.6  CL 97*  --  98* 100*  CO2 27  --  29 29  GLUCOSE 150*  --  62* 121*  BUN 56*  --  56* 56*  CREATININE 11.44*  --  11.13* 10.76*  CALCIUM 13.4*  12.8* 11.5* 10.7*  MG  --  2.2  --   --      CBC:  Recent Labs Lab 03/14/16 2018 03/15/16 0410  WBC 14.1* 11.5*  HGB 11.4* 9.6*  HCT 34.8* 28.8*  MCV 107.5* 108.1*  PLT 258 185      Microbiology:  No results found for this or any previous visit (from the past 720 hour(s)).  Coagulation Studies: No results for input(s): LABPROT, INR in the last 72 hours.  Urinalysis:  Recent Labs  03/14/16 2044  COLORURINE YELLOW*  LABSPEC 1.014  PHURINE 5.0  GLUCOSEU 50*  HGBUR NEGATIVE  BILIRUBINUR NEGATIVE  KETONESUR NEGATIVE  PROTEINUR 100*  NITRITE NEGATIVE  LEUKOCYTESUR TRACE*      Imaging: No results found.   Medications:     . amLODipine  5 mg Oral Daily  . aspirin EC  325 mg Oral Daily  . cloNIDine  0.3 mg Oral BID  . dialysis solution 1.5% low-MG/low-CA   Intraperitoneal Q24H  . docusate sodium  100 mg Oral BID  . feeding supplement (NEPRO CARB STEADY)  237 mL Oral Q24H  . gentamicin cream  1 application Topical Daily  . heparin  5,000 Units Subcutaneous Q8H  . insulin aspart  0-15  Units Subcutaneous TID WC  . insulin aspart  0-5 Units Subcutaneous QHS  . latanoprost  1 drop Both Eyes QHS  . losartan  50 mg Oral QHS  . multivitamin  1 tablet Oral Daily  . pantoprazole  40 mg Oral QAC breakfast  . pravastatin  80 mg Oral Daily  . sodium chloride flush  3 mL Intravenous Q12H   acetaminophen **OR** acetaminophen, bisacodyl, heparin, ondansetron **OR** ondansetron (ZOFRAN) IV, ondansetron, traZODone  Assessment/ Plan:  75 y.o. female with end-stage renal disease, hypertension, diabetes presents for symptomatic hypercalcemia  1.  End-stage renal disease We will continue her outpatient PD prescription out 1700 cc x 6 cycles. Use 1.5% solution Did well last night  2.  Hypercalcemia DISCONTINUE calcitriol (ROCALTROL) Improved Will f/u on SPEP as outpatient  3.anemia chronic kidney disease Hemoglobin 9.6, MCV high at 108.1 Agree with  rena-vite     LOS: 2 Rayley Gao 5/12/201710:31 AM

## 2016-03-16 NOTE — Discharge Summary (Signed)
[place] [facility] [place] NAME: Laurie Steele    MR#:  [id number]  DATE OF BIRTH:  20-Jan-1941  DATE OF ADMISSION:  03/14/2016 ADMITTING PHYSICIAN: Max Sane, MD  DATE OF DISCHARGE: 03/16/2016  PRIMARY CARE PHYSICIAN: [name], MD    ADMISSION DIAGNOSIS:  Hypercalcemia [E83.52]  DISCHARGE DIAGNOSIS:  Active Problems:   Hypercalcemia   SECONDARY DIAGNOSIS:   Past Medical History  Diagnosis Date  . Hx of colonic polyps   . Hyperlipidemia   . Hypertension   . Renal failure     Dr Holley Raring  . 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 Chronic anemia     Dr Inez Pilgrim  . Glaucoma   . GERD (gastroesophageal reflux disease)   . Gout   . Miscarriage   . Vaginal delivery     x 4  . Peritoneal dialysis status [facility])     HOSPITAL COURSE:   * Hypercalcemia - Unknown etiology at this time- likely calcitriol use as out pt. - We will hold calcitriol, give IV hydration  - Monitor her on off unit telemetry - check Intact PTH to determine further etiology -Appreciated nephrology help. - Calcium level normal now. - nephrology suggested to stop calcitrol on discharge.  * Hypokalemia - Replete and recheck - Could be due to vomiting  * Nausea & Vomiting - Symptomatic management - Likely viral infection - IV hydration  * Diabetes mellitus - Sugar is running around 150 so hold off her insulin, Lantus and home dose of diabetic medication, just keep her on sliding scale insulin at this time.  - Check hemoglobin A1c  * ESRD on PD - Nephro c/s for PD need   DISCHARGE CONDITIONS:    Stable/  CONSULTS OBTAINED:     DRUG ALLERGIES:  No Known Allergies  DISCHARGE MEDICATIONS:   Current Discharge Medication List    CONTINUE these medications which have NOT  CHANGED   Details  amLODipine (NORVASC) 5 MG tablet Take 5 mg by mouth daily.      aspirin EC 325 MG tablet Take 325 mg by mouth daily.    !! B Complex-C-Folic Acid (DIALYVITE Q000111Q PO) Take 1 tablet by mouth daily.    cinacalcet (SENSIPAR) 90 MG tablet Take 90 mg by mouth daily.    cloNIDine (CATAPRES) 0.3 MG tablet Take 1 tablet (0.3 mg total) by mouth 2 (two) times daily. Qty: 60 tablet, Refills: 11    docusate sodium (COLACE) 100 MG capsule Take 1 capsule (100 mg total) by mouth 2 (two) times daily. Qty: 20 capsule, Refills: 2    fluticasone (FLONASE) 50 MCG/ACT nasal spray Place 2 sprays into both nostrils daily.    hydrALAZINE (APRESOLINE) 50 MG tablet Take 50 mg by mouth 3 (three) times daily.      HYDROcodone-acetaminophen (NORCO/VICODIN) 5-325 MG tablet Take 1 tablet by mouth every 4 (four) hours as needed for moderate pain. Qty: 20 tablet, Refills: 0    insulin aspart (NOVOLOG) 100 UNIT/ML injection Inject 20-48 Units into the skin 2 (two) times daily. Pt uses 20 units in the morning and 48 units in the evening.    insulin glargine (LANTUS) 100 UNIT/ML injection Inject 0.8 mLs (80 Units total) into the skin at bedtime. Qty: 7 vial, Refills: 3  ipratropium (ATROVENT) 0.03 % nasal spray Place 1 spray into both nostrils 3 (three) times daily as needed for rhinitis.    isosorbide mononitrate (IMDUR) 30 MG 24 hr tablet Take 30 mg by mouth 2 (two) times daily.    losartan (COZAAR) 50 MG tablet Take 50 mg by mouth at bedtime.     metoCLOPramide (REGLAN) 10 MG tablet Take 1 tablet (10 mg total) by mouth every 8 (eight) hours as needed for nausea. Qty: 15 tablet, Refills: 0    !! multivitamin (RENA-VIT) TABS tablet Take 1 tablet by mouth daily.      ondansetron (ZOFRAN) 8 MG tablet Take 8 mg by mouth every 8 (eight) hours as needed for nausea or vomiting.    pantoprazole (PROTONIX) 40 MG tablet Take 40 mg by mouth daily.    pravastatin (PRAVACHOL) 80 MG tablet Take 1  tablet (80 mg total) by mouth daily. Qty: 90 tablet, Refills: 3    sevelamer (RENVELA) 800 MG tablet Take 800 mg by mouth 3 (three) times daily with meals.       !! - Potential duplicate medications found. Please discuss with provider.    STOP taking these medications     latanoprost (XALATAN) 0.005 % ophthalmic solution      timolol (BETIMOL) 0.5 % ophthalmic solution      calcitRIOL (ROCALTROL) 0.5 MCG capsule      ciprofloxacin (CIPRO) 500 MG tablet      metroNIDAZOLE (FLAGYL) 500 MG tablet          DISCHARGE INSTRUCTIONS:    Follow with nephrology clinic in 2 weeks.  If you experience worsening of your admission symptoms, develop shortness of breath, life threatening emergency, suicidal or homicidal thoughts you must seek medical attention immediately by calling 911 or calling your MD immediately  if symptoms less severe.  You Must read complete instructions/literature along with all the possible adverse reactions/side effects for all the Medicines you take and that have been prescribed to you. Take any new Medicines after you have completely understood and accept all the possible adverse reactions/side effects.   Please note  You were cared for by a hospitalist during your hospital stay. If you have any questions about your discharge medications or the care you received while you were in the hospital after you are discharged, you can call the unit and asked to speak with the hospitalist on call if the hospitalist that took care of you is not available. Once you are discharged, your primary care physician will handle any further medical issues. Please note that NO REFILLS for any discharge medications will be authorized once you are discharged, as it is imperative that you return to your primary care physician (or establish a relationship with a primary care physician if you do not have one) for your aftercare needs so that they can reassess your need for medications and monitor  your lab values.    Today   CHIEF COMPLAINT:   Chief Complaint  Patient presents with  . Emesis    HISTORY OF PRESENT ILLNESS:  Laurie Steele  is a 75 y.o. female with a known history of Hypertension, diabetes, on peritoneal dialysis is being admitted for symptomatic hypercalcemia. Patient started having vomiting today. She reports at least 4-5 episodes and unable to keep anything down. She also reports forearm and hand cramping for which she decided to come to the emergency department. While in the ED she was noted to have calcium of 13.44 for which ED physician  discussed with nephrology who recommended admission.   VITAL SIGNS:  Blood pressure 163/64, pulse 65, temperature 98.3 F (36.8 C), temperature source Oral, resp. rate 16, height 5\' 7"  (1.702 m), weight 100.472 kg (221 lb 8 oz), SpO2 100 %.  I/O:   Intake/Output Summary (Last 24 hours) at 03/16/16 1107 Last data filed at 03/16/16 0921  Gross per 24 hour  Intake    540 ml  Output    351 ml  Net    189 ml    PHYSICAL EXAMINATION:   GENERAL: 75 y.o.-year-old patient lying in the bed with no acute distress.  EYES: Pupils equal, round, reactive to light and accommodation. No scleral icterus. Extraocular muscles intact.  HEENT: Head atraumatic, normocephalic. Oropharynx and nasopharynx clear.  NECK: Supple, no jugular venous distention. No thyroid enlargement, no tenderness.  LUNGS: Normal breath sounds bilaterally, no wheezing, rales,rhonchi or crepitation. No use of accessory muscles of respiration.  CARDIOVASCULAR: S1, S2 normal. No murmurs, rubs, or gallops.  ABDOMEN: Soft, nontender, nondistended. Bowel sounds present. No organomegaly or mass. PD catheter in place. EXTREMITIES: No pedal edema, cyanosis, or clubbing.  NEUROLOGIC: Cranial nerves II through XII are intact. Muscle strength 5/5 in all extremities. Sensation intact. Gait not checked.  PSYCHIATRIC: The patient is alert and oriented x 3.   SKIN: No obvious rash, lesion, or ulcer.   DATA REVIEW:   CBC  Recent Labs Lab 03/15/16 0410  WBC 11.5*  HGB 9.6*  HCT 28.8*  PLT 185    Chemistries   Recent Labs Lab 03/14/16 2018 03/14/16 2317  03/16/16 0412  NA 138  --   < > 138  K 3.2*  --   < > 3.6  CL 97*  --   < > 100*  CO2 27  --   < > 29  GLUCOSE 150*  --   < > 121*  BUN 56*  --   < > 56*  CREATININE 11.44*  --   < > 10.76*  CALCIUM 13.4* 12.8*  < > 10.7*  MG  --  2.2  --   --   AST 23  --   --   --   ALT 25  --   --   --   ALKPHOS 83  --   --   --   BILITOT 0.5  --   --   --   < > = values in this interval not displayed.  Cardiac Enzymes No results for input(s): TROPONINI in the last 168 hours.  Microbiology Results  Results for orders placed or performed in visit on 10/20/13  Influenza A&B Antigens [facility])     Status: None   Collection Time: 10/20/13  6:50 AM  Result Value Ref Range Status   Micro Text Report   Final       SOURCE: SWAB    COMMENT                   NEGATIVE FOR INFLUENZA A (ANTIGEN ABSENT)   COMMENT                   NEGATIVE FOR INFLUENZA B (ANTIGEN ABSENT)   ANTIBIOTIC  RADIOLOGY:  No results found.  EKG:   Orders placed or performed during the hospital encounter of 03/14/16  . ED EKG  . ED EKG  . EKG 12-Lead  . EKG 12-Lead      Management plans discussed with the patient, family and they are in agreement.  CODE STATUS:     Code Status Orders        Start     Ordered   03/14/16 2350  Full code   Continuous     03/14/16 2350    Code Status History    Date Active Date Inactive Code Status Order ID Comments User Context   This patient has a current code status but no historical code status.      TOTAL TIME TAKING CARE OF THIS PATIENT: 35 minutes.    Vaughan Basta M.D on 03/16/2016 at 11:07 AM  Between 7am to 6pm - Pager - [phone]  After 6pm go to [email or web address] - password [email or web address]  [facility]  Office  [phone]  CC: Primary care physician; [name], MD   Note: This dictation was prepared with Dragon dictation along with smaller phrase technology. Any transcriptional errors that result from this process are unintentional.

## 2016-03-16 NOTE — Progress Notes (Signed)
Disconnected from cycler

## 2016-04-05 ENCOUNTER — Emergency Department: Payer: Medicare Other

## 2016-04-05 ENCOUNTER — Observation Stay
Admission: EM | Admit: 2016-04-05 | Discharge: 2016-04-06 | Disposition: A | Discharge status: Home / Self Care | Payer: Medicare Other | Attending: Internal Medicine | Admitting: Internal Medicine

## 2016-04-05 ENCOUNTER — Encounter: Payer: Self-pay | Admitting: Emergency Medicine

## 2016-04-05 DIAGNOSIS — E1122 Type 2 diabetes mellitus with diabetic chronic kidney disease: Secondary | ICD-10-CM | POA: Diagnosis not present

## 2016-04-05 DIAGNOSIS — N2581 Secondary hyperparathyroidism of renal origin: Secondary | ICD-10-CM | POA: Insufficient documentation

## 2016-04-05 DIAGNOSIS — Z79899 Other long term (current) drug therapy: Secondary | ICD-10-CM | POA: Insufficient documentation

## 2016-04-05 DIAGNOSIS — R079 Chest pain, unspecified: Secondary | ICD-10-CM

## 2016-04-05 DIAGNOSIS — Z992 Dependence on renal dialysis: Secondary | ICD-10-CM | POA: Diagnosis not present

## 2016-04-05 DIAGNOSIS — M109 Gout, unspecified: Secondary | ICD-10-CM | POA: Diagnosis not present

## 2016-04-05 DIAGNOSIS — K219 Gastro-esophageal reflux disease without esophagitis: Secondary | ICD-10-CM | POA: Insufficient documentation

## 2016-04-05 DIAGNOSIS — Z9049 Acquired absence of other specified parts of digestive tract: Secondary | ICD-10-CM | POA: Insufficient documentation

## 2016-04-05 DIAGNOSIS — E11319 Type 2 diabetes mellitus with unspecified diabetic retinopathy without macular edema: Secondary | ICD-10-CM | POA: Diagnosis not present

## 2016-04-05 DIAGNOSIS — Z7982 Long term (current) use of aspirin: Secondary | ICD-10-CM | POA: Insufficient documentation

## 2016-04-05 DIAGNOSIS — Z9889 Other specified postprocedural states: Secondary | ICD-10-CM | POA: Insufficient documentation

## 2016-04-05 DIAGNOSIS — N186 End stage renal disease: Secondary | ICD-10-CM | POA: Insufficient documentation

## 2016-04-05 DIAGNOSIS — E1121 Type 2 diabetes mellitus with diabetic nephropathy: Secondary | ICD-10-CM | POA: Insufficient documentation

## 2016-04-05 DIAGNOSIS — R0789 Other chest pain: Secondary | ICD-10-CM | POA: Diagnosis not present

## 2016-04-05 DIAGNOSIS — Z86010 Personal history of colonic polyps: Secondary | ICD-10-CM | POA: Insufficient documentation

## 2016-04-05 DIAGNOSIS — Z7951 Long term (current) use of inhaled steroids: Secondary | ICD-10-CM | POA: Insufficient documentation

## 2016-04-05 DIAGNOSIS — H409 Unspecified glaucoma: Secondary | ICD-10-CM | POA: Insufficient documentation

## 2016-04-05 DIAGNOSIS — Z794 Long term (current) use of insulin: Secondary | ICD-10-CM | POA: Insufficient documentation

## 2016-04-05 DIAGNOSIS — Z87891 Personal history of nicotine dependence: Secondary | ICD-10-CM | POA: Diagnosis not present

## 2016-04-05 DIAGNOSIS — D631 Anemia in chronic kidney disease: Secondary | ICD-10-CM | POA: Diagnosis not present

## 2016-04-05 DIAGNOSIS — I120 Hypertensive chronic kidney disease with stage 5 chronic kidney disease or end stage renal disease: Secondary | ICD-10-CM | POA: Diagnosis not present

## 2016-04-05 LAB — CBC
HEMATOCRIT: 28.2 % — AB (ref 35.0–47.0)
HEMOGLOBIN: 9.4 g/dL — AB (ref 12.0–16.0)
MCH: 35.3 pg — AB (ref 26.0–34.0)
MCHC: 33.3 g/dL (ref 32.0–36.0)
MCV: 105.8 fL — AB (ref 80.0–100.0)
Platelets: 228 10*3/uL (ref 150–440)
RBC: 2.66 MIL/uL — ABNORMAL LOW (ref 3.80–5.20)
RDW: 15.5 % — ABNORMAL HIGH (ref 11.5–14.5)
WBC: 13.1 10*3/uL — ABNORMAL HIGH (ref 3.6–11.0)

## 2016-04-05 LAB — MAGNESIUM: Magnesium: 2 mg/dL (ref 1.7–2.4)

## 2016-04-05 LAB — COMPREHENSIVE METABOLIC PANEL
ALK PHOS: 58 U/L (ref 38–126)
ALT: 26 U/L (ref 14–54)
AST: 25 U/L (ref 15–41)
Albumin: 3.2 g/dL — ABNORMAL LOW (ref 3.5–5.0)
Anion gap: 17 — ABNORMAL HIGH (ref 5–15)
BILIRUBIN TOTAL: 0.5 mg/dL (ref 0.3–1.2)
BUN: 54 mg/dL — ABNORMAL HIGH (ref 6–20)
CALCIUM: 7 mg/dL — AB (ref 8.9–10.3)
CO2: 24 mmol/L (ref 22–32)
CREATININE: 12.07 mg/dL — AB (ref 0.44–1.00)
Chloride: 100 mmol/L — ABNORMAL LOW (ref 101–111)
GFR, EST AFRICAN AMERICAN: 3 mL/min — AB (ref >60)
GFR, EST NON AFRICAN AMERICAN: 3 mL/min — AB (ref >60)
Glucose, Bld: 163 mg/dL — ABNORMAL HIGH (ref 65–99)
Potassium: 3.5 mmol/L (ref 3.5–5.1)
SODIUM: 141 mmol/L (ref 135–145)
Total Protein: 6.7 g/dL (ref 6.5–8.1)

## 2016-04-05 LAB — TROPONIN I
Troponin I: 0.04 ng/mL — ABNORMAL HIGH (ref <0.031)
Troponin I: 0.03 ng/mL (ref <0.031)

## 2016-04-05 LAB — GLUCOSE, CAPILLARY: Glucose-Capillary: 265 mg/dL — ABNORMAL HIGH (ref 65–99)

## 2016-04-05 MED ORDER — ONDANSETRON HCL 4 MG PO TABS: 8.0000 mg | ORAL_TABLET | Freq: Three times a day (TID) | ORAL | Status: DC | PRN

## 2016-04-05 MED ORDER — GABAPENTIN 100 MG PO CAPS
100.0000 mg | ORAL_CAPSULE | Freq: Three times a day (TID) | ORAL | Status: DC
  Administered 2016-04-05 – 2016-04-06 (×2): 100 mg via ORAL
  Filled 2016-04-05 (×3): qty 1

## 2016-04-05 MED ORDER — ACETAMINOPHEN 650 MG RE SUPP: 650.0000 mg | Freq: Four times a day (QID) | RECTAL | Status: DC | PRN

## 2016-04-05 MED ORDER — CINACALCET HCL 30 MG PO TABS
90.0000 mg | ORAL_TABLET | Freq: Every day | ORAL | Status: DC
  Administered 2016-04-06: 90 mg via ORAL
  Filled 2016-04-05: qty 3

## 2016-04-05 MED ORDER — ISOSORBIDE MONONITRATE ER 30 MG PO TB24
30.0000 mg | ORAL_TABLET | Freq: Two times a day (BID) | ORAL | Status: DC
  Administered 2016-04-05 – 2016-04-06 (×2): 30 mg via ORAL
  Filled 2016-04-05 (×2): qty 1

## 2016-04-05 MED ORDER — SEVELAMER CARBONATE 800 MG PO TABS
800.0000 mg | ORAL_TABLET | Freq: Three times a day (TID) | ORAL | Status: DC
  Filled 2016-04-05 (×2): qty 1

## 2016-04-05 MED ORDER — HYDRALAZINE HCL 50 MG PO TABS
50.0000 mg | ORAL_TABLET | Freq: Three times a day (TID) | ORAL | Status: DC
  Administered 2016-04-05 – 2016-04-06 (×3): 50 mg via ORAL
  Filled 2016-04-05 (×3): qty 1

## 2016-04-05 MED ORDER — RENA-VITE PO TABS
1.0000 | ORAL_TABLET | Freq: Every day | ORAL | Status: DC
  Administered 2016-04-06: 1 via ORAL
  Filled 2016-04-05: qty 1

## 2016-04-05 MED ORDER — CLONIDINE HCL 0.1 MG PO TABS
0.3000 mg | ORAL_TABLET | Freq: Two times a day (BID) | ORAL | Status: DC
  Administered 2016-04-05 – 2016-04-06 (×2): 0.3 mg via ORAL
  Filled 2016-04-05 (×2): qty 3

## 2016-04-05 MED ORDER — LOSARTAN POTASSIUM 25 MG PO TABS
100.0000 mg | ORAL_TABLET | Freq: Every day | ORAL | Status: DC
  Administered 2016-04-06: 100 mg via ORAL
  Filled 2016-04-05: qty 4

## 2016-04-05 MED ORDER — PRAVASTATIN SODIUM 40 MG PO TABS
80.0000 mg | ORAL_TABLET | Freq: Every day | ORAL | Status: DC
  Administered 2016-04-05: 80 mg via ORAL
  Filled 2016-04-05 (×2): qty 2

## 2016-04-05 MED ORDER — DELFLEX-LC/1.5% DEXTROSE 346 MOSM/L IP SOLN
INTRAPERITONEAL | Status: DC
  Administered 2016-04-05: 20:00:00 via INTRAPERITONEAL
  Filled 2016-04-05: qty 3000

## 2016-04-05 MED ORDER — FLUTICASONE PROPIONATE 50 MCG/ACT NA SUSP
2.0000 | Freq: Every day | NASAL | Status: DC | PRN
  Filled 2016-04-05: qty 16

## 2016-04-05 MED ORDER — INSULIN GLARGINE 100 UNIT/ML ~~LOC~~ SOLN
50.0000 [IU] | Freq: Every day | SUBCUTANEOUS | Status: DC
  Administered 2016-04-05: 50 [IU] via SUBCUTANEOUS
  Filled 2016-04-05 (×2): qty 0.5

## 2016-04-05 MED ORDER — NITROGLYCERIN 2 % TD OINT
1.0000 [in_us] | TOPICAL_OINTMENT | Freq: Once | TRANSDERMAL | Status: AC
  Administered 2016-04-05: 1 [in_us] via TOPICAL
  Filled 2016-04-05: qty 1

## 2016-04-05 MED ORDER — METOCLOPRAMIDE HCL 10 MG PO TABS: 10.0000 mg | ORAL_TABLET | Freq: Three times a day (TID) | ORAL | Status: DC | PRN

## 2016-04-05 MED ORDER — ALLOPURINOL 100 MG PO TABS
100.0000 mg | ORAL_TABLET | Freq: Every day | ORAL | Status: DC
  Administered 2016-04-06: 100 mg via ORAL
  Filled 2016-04-05: qty 1

## 2016-04-05 MED ORDER — ASPIRIN EC 81 MG PO TBEC
81.0000 mg | DELAYED_RELEASE_TABLET | Freq: Every day | ORAL | Status: DC
  Administered 2016-04-06: 81 mg via ORAL
  Filled 2016-04-05 (×2): qty 1

## 2016-04-05 MED ORDER — IPRATROPIUM BROMIDE 0.03 % NA SOLN
1.0000 | Freq: Two times a day (BID) | NASAL | Status: DC
  Administered 2016-04-05: 1 via NASAL
  Filled 2016-04-05: qty 30

## 2016-04-05 MED ORDER — HEPARIN SODIUM (PORCINE) 5000 UNIT/ML IJ SOLN
5000.0000 [IU] | Freq: Three times a day (TID) | INTRAMUSCULAR | Status: DC
  Administered 2016-04-05 – 2016-04-06 (×2): 5000 [IU] via SUBCUTANEOUS
  Filled 2016-04-05 (×3): qty 1

## 2016-04-05 MED ORDER — SODIUM CHLORIDE 0.9% FLUSH
3.0000 mL | Freq: Two times a day (BID) | INTRAVENOUS | Status: DC
Start: 2016-04-05 — End: 2016-04-06
  Administered 2016-04-06: 3 mL via INTRAVENOUS

## 2016-04-05 MED ORDER — POLYETHYLENE GLYCOL 3350 17 G PO PACK
17.0000 g | PACK | Freq: Every day | ORAL | Status: DC
  Administered 2016-04-05 – 2016-04-06 (×2): 17 g via ORAL
  Filled 2016-04-05 (×2): qty 1

## 2016-04-05 MED ORDER — ALPRAZOLAM 0.5 MG PO TABS: 0.2500 mg | ORAL_TABLET | Freq: Every evening | ORAL | Status: DC | PRN

## 2016-04-05 MED ORDER — MORPHINE SULFATE (PF) 4 MG/ML IV SOLN: 4.0000 mg | Freq: Once | INTRAVENOUS | Status: DC

## 2016-04-05 MED ORDER — LATANOPROST 0.005 % OP SOLN
1.0000 [drp] | Freq: Every day | OPHTHALMIC | Status: DC
  Administered 2016-04-05: 1 [drp] via OPHTHALMIC
  Filled 2016-04-05 (×2): qty 2.5

## 2016-04-05 MED ORDER — DOCUSATE SODIUM 100 MG PO CAPS
100.0000 mg | ORAL_CAPSULE | Freq: Two times a day (BID) | ORAL | Status: DC
  Administered 2016-04-05 – 2016-04-06 (×2): 100 mg via ORAL
  Filled 2016-04-05 (×2): qty 1

## 2016-04-05 MED ORDER — PANTOPRAZOLE SODIUM 40 MG PO TBEC
40.0000 mg | DELAYED_RELEASE_TABLET | Freq: Two times a day (BID) | ORAL | Status: DC
  Administered 2016-04-05 – 2016-04-06 (×2): 40 mg via ORAL
  Filled 2016-04-05 (×2): qty 1

## 2016-04-05 MED ORDER — LACTULOSE 10 GM/15ML PO SOLN: 10.0000 g | Freq: Two times a day (BID) | ORAL | Status: DC | PRN

## 2016-04-05 MED ORDER — AMITRIPTYLINE HCL 25 MG PO TABS: 25.0000 mg | ORAL_TABLET | Freq: Every evening | ORAL | Status: DC | PRN

## 2016-04-05 MED ORDER — HYDROCODONE-ACETAMINOPHEN 5-325 MG PO TABS
1.0000 | ORAL_TABLET | ORAL | Status: DC | PRN
Start: 2016-04-05 — End: 2016-04-06

## 2016-04-05 MED ORDER — AMLODIPINE BESYLATE 5 MG PO TABS
10.0000 mg | ORAL_TABLET | Freq: Every day | ORAL | Status: DC
  Administered 2016-04-06: 10 mg via ORAL
  Filled 2016-04-05: qty 2

## 2016-04-05 MED ORDER — POLYETHYLENE GLYCOL 3350 17 GM/SCOOP PO POWD
17.0000 g | Freq: Every day | ORAL | Status: DC
  Filled 2016-04-05: qty 255

## 2016-04-05 MED ORDER — GENTAMICIN SULFATE 0.1 % EX CREA
1.0000 "application " | TOPICAL_CREAM | Freq: Every day | CUTANEOUS | Status: DC
  Administered 2016-04-05: 1 via TOPICAL
  Filled 2016-04-05 (×2): qty 15

## 2016-04-05 MED ORDER — ACETAMINOPHEN 325 MG PO TABS: 650.0000 mg | ORAL_TABLET | Freq: Four times a day (QID) | ORAL | Status: DC | PRN

## 2016-04-05 MED ORDER — ATENOLOL 25 MG PO TABS
25.0000 mg | ORAL_TABLET | Freq: Every day | ORAL | Status: DC
  Administered 2016-04-06: 25 mg via ORAL
  Filled 2016-04-05 (×2): qty 1

## 2016-04-05 MED ORDER — CYCLOBENZAPRINE HCL 10 MG PO TABS: 5.0000 mg | ORAL_TABLET | Freq: Three times a day (TID) | ORAL | Status: DC | PRN

## 2016-04-05 NOTE — Progress Notes (Signed)
Pre peritoneal dialysis tx 

## 2016-04-05 NOTE — Progress Notes (Signed)
Connected to peritoneal dialysis cycler. 

## 2016-04-05 NOTE — Progress Notes (Signed)
RN was notified from central tele that pt had a six beat run of V tach. Pt immediately came back to NSR and has been NSR since the V tach run. Pt is asymptomatic and VSS. MD notified and was informed that potassium levels were normal and MD wanted to proceed with troponin lab being drawn and to add a magnesium level to that troponin lab.

## 2016-04-05 NOTE — ED Provider Notes (Signed)
[facility] Emergency Department Provider Note        Time seen: ----------------------------------------- 12:36 PM on 04/05/2016 -----------------------------------------    I have reviewed the triage vital signs and the nursing notes.   HISTORY  Chief Complaint Chest Pain    HPI Laurie Steele is a 75 y.o. female who presents to ER for chest pain.Patient had chest pain last night as well as sore throat, nausea and vomiting, weakness as well as some shortness of breath. Patient states the pain bothered her all night where she couldn't sleep. She said some of these symptoms before but never this bad. She's not had any abdominal pain, peritoneal dialysate fluid is clear, no fever.   Past Medical History  Diagnosis Date  . Hx of colonic polyps   . Hyperlipidemia   . Hypertension   . Renal failure     Dr Holley Raring  . 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 Chronic anemia     Dr Inez Pilgrim  . Glaucoma   . GERD (gastroesophageal reflux disease)   . Gout   . Miscarriage   . Vaginal delivery     x 4  . Peritoneal dialysis status [facility])     Patient Active Problem List   Diagnosis Date Noted  . Hypercalcemia 03/14/2016  . Bronchitis 03/03/2012  . Type II or unspecified type diabetes mellitus with renal manifestations, not stated as uncontrolled 12/18/2011  . END STAGE RENAL DISEASE 10/23/2010  . NEUROPATHY 12/13/2009  . GOUT 02/15/2009  . SLEEP DISORDER 02/15/2009  . GERD 02/23/2008  . HYPERLIPIDEMIA 05/14/2007  . ANEMIA, CHRONIC 05/14/2007  . HYPERTENSION 05/14/2007    Past Surgical History  Procedure Laterality Date  . Cholecystectomy    . Abdominal hysterectomy  1983  . Back surgery  6/08    Dr Collier Salina  . Back surgery  1987    disc    Allergies Review of patient's  allergies indicates no known allergies.  Social History Social History  Substance Use Topics  . Smoking status: Former Smoker    Quit date: 11/05/1990  . Smokeless tobacco: Never Used  . Alcohol Use: No    Review of Systems Constitutional: Negative for fever. Eyes: Negative for visual changes. ENT: Positive for sore throat Cardiovascular: Positive for chest pain Respiratory: As if her shortness of breath Gastrointestinal: Negative for abdominal pain, positive for vomiting Genitourinary: Negative for dysuria. Musculoskeletal: Negative for back pain. Skin: Negative for rash. Neurological: Negative for headaches, positive for weakness  10-point ROS otherwise negative.  ____________________________________________   PHYSICAL EXAM:  VITAL SIGNS: ED Triage Vitals  Enc Vitals Group     BP --      Pulse --      Resp --      Temp --      Temp src --      SpO2 --      Weight --      Height --      Head Cir --      Peak Flow --      Pain Score --      Pain Loc --      Pain Edu? --      Excl. in [place]? --     Constitutional: Alert and oriented. Generally ill-appearing, no acute distress  Eyes: Conjunctivae are normal. PERRL. Normal extraocular movements. ENT   Head: Normocephalic and atraumatic.   Nose: No congestion/rhinnorhea.   Mouth/Throat: Mucous membranes are moist.   Neck: No stridor. Cardiovascular: Normal rate, regular rhythm. No murmurs, rubs, or gallops. Respiratory: Normal respiratory effort without tachypnea nor retractions. Breath sounds are clear and equal bilaterally. No wheezes/rales/rhonchi. Gastrointestinal: Soft and nontender. Normal bowel sounds, perineal dialysis catheter is unremarkable Musculoskeletal: Nontender with normal range of motion in all extremities. No lower extremity tenderness nor edema. Neurologic:  Normal speech and language. No gross focal neurologic deficits are appreciated.  Skin:  Skin is warm, dry and intact. No rash  noted. Psychiatric: Depressed mood and affect ____________________________________________  EKG: Interpreted by me. Sinus rhythm with rate 84 bpm, normal PR interval, normal QRS, normal QT interval. Left axis deviation  ____________________________________________  ED COURSE:  Pertinent labs & imaging results that were available during my care of the patient were reviewed by me and considered in my medical decision making (see chart for details). Patient presents to ER with multiple symptoms. We'll check basic labs, give morphine for pain and reevaluate. ____________________________________________    LABS (pertinent positives/negatives)  Labs Reviewed  CBC - Abnormal; Notable for the following:    WBC 13.1 (*)    RBC 2.66 (*)    Hemoglobin 9.4 (*)    HCT 28.2 (*)    MCV 105.8 (*)    MCH 35.3 (*)    RDW 15.5 (*)    All other components within normal limits  TROPONIN I - Abnormal; Notable for the following:    Troponin I 0.04 (*)    All other components within normal limits  COMPREHENSIVE METABOLIC PANEL - Abnormal; Notable for the following:    Chloride 100 (*)    Glucose, Bld 163 (*)    BUN 54 (*)    Creatinine, Ser 12.07 (*)    Calcium 7.0 (*)    Albumin 3.2 (*)    GFR calc non Af Amer 3 (*)    GFR calc Af Amer 3 (*)    Anion gap 17 (*)    All other components within normal limits    RADIOLOGY Images were viewed by me  Chest x-ray IMPRESSION: No acute cardiopulmonary abnormality seen.  ____________________________________________  FINAL ASSESSMENT AND PLAN  Chest pain, End-stage renal disease  Plan: Patient with labs and imaging as dictated above. Patient presents with chest pain that has resolved spontaneously. Patient will be given nitroglycerin paste, will discuss with the hospitalist for admission for cardiac rule out.   Earleen Newport, MD   Note: This dictation was prepared with Dragon dictation. Any transcriptional errors that result from  this process are unintentional   Earleen Newport, MD 04/05/16 1444

## 2016-04-05 NOTE — ED Notes (Signed)
Pt presents to ED with reports of chest pain since last night.

## 2016-04-05 NOTE — H&P (Signed)
[facility] NAME: Laurie Steele    MR#:  [id number]  DATE OF BIRTH:  1941/10/04  DATE OF ADMISSION:  04/05/2016  PRIMARY CARE PHYSICIAN: [name], MD   REQUESTING/REFERRING PHYSICIAN: Cephas Darby  CHIEF COMPLAINT:   Chief Complaint  Patient presents with  . Chest Pain    HISTORY OF PRESENT ILLNESS:  Laurie Steele  is a 75 y.o. female presents with chest pain starting last night at 11 PM to 7 AM. She felt like something was caught in her throat and in her chest. She was having pain all last night. Still feeling bad this morning. She was having some dry heaves. Chest discomfort felt like a pressure center of the chest nothing made it better or worse. Also had some shortness of breath with this episode. Currently patient feels okay. Patient did have some applesauce for breakfast and ate a [place] sandwich for lunch and is able to swallow her own saliva. Last night she did have salmon for dinner. Patient's first troponin was a little bit borderline. She refused a stress test. Currently chest pain-free.  PAST MEDICAL HISTORY:   Past Medical History  Diagnosis Date  . Hx of colonic polyps   . Hyperlipidemia   . Hypertension   . Renal failure     Dr Holley Raring  . 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 Chronic anemia     Dr Inez Pilgrim  . Glaucoma   . GERD (gastroesophageal reflux disease)   . Gout   . Miscarriage   . Vaginal delivery     x 4  . Peritoneal dialysis status ([place])     PAST SURGICAL HISTORY:   Past Surgical History  Procedure Laterality Date  . Cholecystectomy    . Abdominal hysterectomy  1983  . Back surgery  6/08    Dr Collier Salina  . Back surgery  1987    disc    SOCIAL HISTORY:   Social History  Substance Use Topics  .  Smoking status: Former Smoker    Quit date: 11/05/1990  . Smokeless tobacco: Never Used  . Alcohol Use: No    FAMILY HISTORY:   Family History  Problem Relation Age of Onset  . Heart attack Father   . Hypertension Mother   . Hyperlipidemia Mother   . Coronary artery disease      strong fam hx  . Breast cancer Neg Hx     DRUG ALLERGIES:  No Known Allergies  REVIEW OF SYSTEMS:  CONSTITUTIONAL: No fever, fatigue or weakness. Positive hot chills. Positive for fatigue. EYES: No blurred or double vision. Wears glasses.  EARS, NOSE, AND THROAT: No tinnitus or ear pain. No sore throat positive for runny nose RESPIRATORY: No cough, positive shortness of breath, no wheezing or hemoptysis.  CARDIOVASCULAR: Positive chest pain, no orthopnea.  GASTROINTESTINAL: Positive for dry heaves. No vomiting, diarrhea or abdominal pain. No blood in bowel movements GENITOURINARY: No dysuria, hematuria.  ENDOCRINE: No polyuria, nocturia,  HEMATOLOGY: No anemia, easy bruising or bleeding SKIN: No rash or lesion. MUSCULOSKELETAL: No joint pain or arthritis.   NEUROLOGIC: No tingling, numbness, weakness.  PSYCHIATRY: No anxiety or depression.   MEDICATIONS AT HOME:   Prior to Admission medications   Medication Sig Start Date End Date Taking? Authorizing  Provider  allopurinol (ZYLOPRIM) 100 MG tablet Take 1 tablet by mouth daily as needed.   Yes Historical Provider, MD  ALPRAZolam Duanne Moron) 0.25 MG tablet Take 1 tablet by mouth at bedtime as needed.   Yes Historical Provider, MD  amitriptyline (ELAVIL) 25 MG tablet Take 1 tablet by mouth at bedtime as needed (For foot pain).  01/02/16  Yes Historical Provider, MD  amLODipine (NORVASC) 10 MG tablet Take 10 mg by mouth daily. 03/20/16  Yes Historical Provider, MD  aspirin EC 325 MG tablet Take 325 mg by mouth daily.   Yes Historical Provider, MD  atenolol (TENORMIN) 25 MG tablet Take 1 tablet by mouth daily.   Yes Historical Provider, MD  B Complex-C-Folic  Acid (DIALYVITE 800 PO) Take 1 tablet by mouth daily.   Yes Historical Provider, MD  cinacalcet (SENSIPAR) 90 MG tablet Take 90 mg by mouth daily.   Yes Historical Provider, MD  cloNIDine (CATAPRES) 0.3 MG tablet Take 1 tablet (0.3 mg total) by mouth 2 (two) times daily. 07/14/12  Yes Venia Carbon, MD  cyclobenzaprine (FLEXERIL) 5 MG tablet Take 1 tablet by mouth 3 (three) times daily as needed.   Yes Historical Provider, MD  docusate sodium (COLACE) 100 MG capsule Take 1 capsule (100 mg total) by mouth 2 (two) times daily. 08/08/15 08/07/16 Yes Lavonia Drafts, MD  fluticasone [facility]) 50 MCG/ACT nasal spray Place 2 sprays into both nostrils daily as needed for allergies.    Yes Historical Provider, MD  gabapentin (NEURONTIN) 100 MG capsule Take 1 capsule by mouth 3 (three) times daily. 08/09/15 08/08/16 Yes Historical Provider, MD  HUMALOG 100 UNIT/ML injection Inject 20-48 Units into the skin 2 (two) times daily. Pt. Takes 20 units before breakfast and 48 units before supper. 02/16/16  Yes Historical Provider, MD  hydrALAZINE (APRESOLINE) 50 MG tablet Take 50 mg by mouth 3 (three) times daily.     Yes Historical Provider, MD  HYDROcodone-acetaminophen (NORCO/VICODIN) 5-325 MG tablet Take 1 tablet by mouth every 4 (four) hours as needed for moderate pain. 08/08/15  Yes Lavonia Drafts, MD  insulin glargine (LANTUS) 100 UNIT/ML injection Inject 0.8 mLs (80 Units total) into the skin at bedtime. Patient taking differently: Inject 70 Units into the skin at bedtime.  05/28/13  Yes Venia Carbon, MD  ipratropium (ATROVENT) 0.03 % nasal spray Place 1 spray into both nostrils 3 (three) times daily as needed for rhinitis.   Yes Historical Provider, MD  isosorbide mononitrate (IMDUR) 30 MG 24 hr tablet Take 30 mg by mouth 2 (two) times daily.   Yes Historical Provider, MD  lactulose (CHRONULAC) 10 GM/15ML solution Take 15-30 mLs by mouth daily as needed. 11/22/15  Yes Historical Provider, MD  latanoprost (XALATAN)  0.005 % ophthalmic solution Place 1 drop into both eyes at bedtime. 03/20/16  Yes Historical Provider, MD  losartan (COZAAR) 100 MG tablet Take 100 mg by mouth daily. 03/20/16  Yes Historical Provider, MD  metoCLOPramide (REGLAN) 10 MG tablet Take 1 tablet (10 mg total) by mouth every 8 (eight) hours as needed for nausea. 08/01/15 07/31/16 Yes Loney Hering, MD  multivitamin (RENA-VIT) TABS tablet Take 1 tablet by mouth daily.     Yes Historical Provider, MD  ondansetron (ZOFRAN) 8 MG tablet Take 8 mg by mouth every 8 (eight) hours as needed for nausea or vomiting.   Yes Historical Provider, MD  pantoprazole (PROTONIX) 40 MG tablet Take 40 mg by mouth daily.   Yes Historical Provider, MD  polyethylene glycol powder (GLYCOLAX/MIRALAX) powder Take 17 g by mouth daily.   Yes Historical Provider, MD  pravastatin (PRAVACHOL) 80 MG tablet Take 1 tablet (80 mg total) by mouth daily. Patient taking differently: Take 80 mg by mouth at bedtime.  01/27/14  Yes Venia Carbon, MD  sevelamer (RENVELA) 800 MG tablet Take 800 mg by mouth 3 (three) times daily with meals.     Yes Historical Provider, MD      VITAL SIGNS:  Blood pressure 143/55, pulse 71, temperature 98.6 F (37 C), temperature source Oral, resp. rate 18, height 5' 7.5" (1.715 m), weight 97.523 kg (215 lb), SpO2 99 %.  PHYSICAL EXAMINATION:  GENERAL:  75 y.o.-year-old patient lying in the bed with no acute distress.  EYES: Pupils equal, round, reactive to light and accommodation. No scleral icterus. Extraocular muscles intact.  HEENT: Head atraumatic, normocephalic. Oropharynx and nasopharynx clear.  NECK:  Supple, no jugular venous distention. No thyroid enlargement, no tenderness.  LUNGS: Normal breath sounds bilaterally, no wheezing, rales,rhonchi or crepitation. No use of accessory muscles of respiration.  CARDIOVASCULAR: S1, S2 normal. No murmurs, rubs, or gallops.  ABDOMEN: Soft, nontender, nondistended. Bowel sounds present. No  organomegaly or mass.  EXTREMITIES: No pedal edema, cyanosis, or clubbing.  NEUROLOGIC: Cranial nerves II through XII are intact. Muscle strength 5/5 in all extremities. Sensation intact. Gait not checked.  PSYCHIATRIC: The patient is alert and oriented x 3.  SKIN: No rash, lesion, or ulcer.   LABORATORY PANEL:   CBC  Recent Labs Lab 04/05/16 1235  WBC 13.1*  HGB 9.4*  HCT 28.2*  PLT 228   ------------------------------------------------------------------------------------------------------------------  Chemistries   Recent Labs Lab 04/05/16 1235  NA 141  K 3.5  CL 100*  CO2 24  GLUCOSE 163*  BUN 54*  CREATININE 12.07*  CALCIUM 7.0*  AST 25  ALT 26  ALKPHOS 58  BILITOT 0.5   ------------------------------------------------------------------------------------------------------------------  Cardiac Enzymes  Recent Labs Lab 04/05/16 1235  TROPONINI 0.04*   ------------------------------------------------------------------------------------------------------------------  RADIOLOGY:  Dg Chest Port 1 View  04/05/2016  CLINICAL DATA:  Chest pain. EXAM: PORTABLE CHEST 1 VIEW COMPARISON:  July 31, 2015. FINDINGS: The heart size and mediastinal contours are within normal limits. Both lungs are clear. No pneumothorax or pleural effusion is noted. The visualized skeletal structures are unremarkable. IMPRESSION: No acute cardiopulmonary abnormality seen. Electronically Signed   By: Marijo Conception, M.D.   On: 04/05/2016 13:19    EKG:   Normal sinus rhythm 84 bpm left axis deviation RSR prime.   IMPRESSION AND PLAN:   1. Chest pain and borderline troponin. This actually sounds GI in nature to me where she ate a salmon patty and it may have got caught up in her esophagus. She had pain all night and then relieve this morning. She was able to eat applesauce and a [place] sandwich today so likely things have passed. We do not have any GI coverage today. Patient refuses a  stress test. I will get 2 more troponins and cardiology consultation in the a.m. Continue aspirin. Order Protonix twice a day.   2. Peritoneal dialysis consult nephrology for peritoneal dialysis orders   3. Anemia of chronic disease likely with dialysis patients   4. Diabetes. Lantus and sliding scale 5. Essential hypertension continue usual medications 6. Hyperlipidemia unspecified continue cholesterol medication 7. Gout on allopurinol 8. Glaucoma on latanoprost  All the records are reviewed and case discussed with ED provider. Management plans discussed with the patient, family  and they are in agreement.  CODE STATUS: Full code   TOTAL TIME TAKING CARE OF THIS PATIENT: 50  minutes.    Loletha Grayer M.D on 04/05/2016 at 4:20 PM  Between 7am to 6pm - Pager - [phone]  After 6pm call admission pager [phone]  [facility] Office  [phone]  CC: Primary care physician; [name], MD

## 2016-04-06 DIAGNOSIS — R0789 Other chest pain: Secondary | ICD-10-CM | POA: Diagnosis not present

## 2016-04-06 LAB — GLUCOSE, CAPILLARY
GLUCOSE-CAPILLARY: 130 mg/dL — AB (ref 65–99)
Glucose-Capillary: 106 mg/dL — ABNORMAL HIGH (ref 65–99)
Glucose-Capillary: 146 mg/dL — ABNORMAL HIGH (ref 65–99)

## 2016-04-06 LAB — BASIC METABOLIC PANEL
Anion gap: 17 — ABNORMAL HIGH (ref 5–15)
BUN: 55 mg/dL — ABNORMAL HIGH (ref 6–20)
CO2: 25 mmol/L (ref 22–32)
Calcium: 7 mg/dL — ABNORMAL LOW (ref 8.9–10.3)
Chloride: 99 mmol/L — ABNORMAL LOW (ref 101–111)
Creatinine, Ser: 11.98 mg/dL — ABNORMAL HIGH (ref 0.44–1.00)
GFR calc Af Amer: 3 mL/min — ABNORMAL LOW (ref >60)
GFR calc non Af Amer: 3 mL/min — ABNORMAL LOW (ref >60)
Glucose, Bld: 150 mg/dL — ABNORMAL HIGH (ref 65–99)
Potassium: 3.1 mmol/L — ABNORMAL LOW (ref 3.5–5.1)
Sodium: 141 mmol/L (ref 135–145)

## 2016-04-06 LAB — CBC
HEMATOCRIT: 25.3 % — AB (ref 35.0–47.0)
Hemoglobin: 8.6 g/dL — ABNORMAL LOW (ref 12.0–16.0)
MCH: 36.6 pg — ABNORMAL HIGH (ref 26.0–34.0)
MCHC: 33.8 g/dL (ref 32.0–36.0)
MCV: 108.3 fL — AB (ref 80.0–100.0)
Platelets: 184 10*3/uL (ref 150–440)
RBC: 2.34 MIL/uL — AB (ref 3.80–5.20)
RDW: 15.3 % — AB (ref 11.5–14.5)
WBC: 8.7 10*3/uL (ref 3.6–11.0)

## 2016-04-06 MED ORDER — POTASSIUM CHLORIDE CRYS ER 20 MEQ PO TBCR: 20.0000 meq | EXTENDED_RELEASE_TABLET | Freq: Once | ORAL | Status: DC

## 2016-04-06 NOTE — Progress Notes (Signed)
Pt alert and oriented. Discharged to home. IV site removed. Concerns addressed. Discharge summary given to patient

## 2016-04-06 NOTE — Care Management Obs Status (Signed)
[facility] NOTIFICATION   Patient Details  Name: GEORGINA ORMONDE MRN: [id number] Date of Birth: [birth year]   Medicare Observation Status Notification Given:  Yes    Katrina Stack, RN 04/06/2016, 9:36 AM

## 2016-04-06 NOTE — Care Management Note (Signed)
Patient is active at [facility] on PD.  I am sending admission records now and will follow up with additional records at discharge.  Iran Sizer   Dialysis Coordinator   [phone]

## 2016-04-06 NOTE — Discharge Summary (Signed)
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 Hx of colonic polyps   . Hyperlipidemia   . Hypertension   . Renal failure     Dr Holley Raring  . 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 Chronic anemia     Dr Inez Pilgrim  . Glaucoma   . GERD (gastroesophageal reflux disease)   . Gout   . Miscarriage   . Vaginal delivery     x 4  . Peritoneal dialysis status [facility])     HOSPITAL COURSE:  Laurie Steele is a 75 y.o. female presents with chest pain starting last night at 11 PM to 7 AM. She felt like something was caught in her throat and in her chest. She was having pain all last night. Still feeling bad this morning. She was having some dry heaves. Chest discomfort felt like a pressure center of the chest nothing made it better or worse  1.Chest pain and borderline troponin. This actually sounds GI in nature to me where she ate a salmon patty and it may have got caught up in her esophagus. She had pain all night and then relieve this morning. She was able to eat applesauce and a [place] sandwich yday and Tolerating by mouth diet today.  -Patient denies any chest pain. EKG is unremarkable. Her repeat cardiac marker troponin is 0.03  -Spoke with cardiology okayed to follow up as outpatient  -Patient refuses a stress test. -Continue aspirin. -  Order Protonix twice a day.  2. Peritoneal dialysis  -consulted nephrology for peritoneal dialysis orders  3. Anemia of chronic disease likely with dialysis patients  4. Diabetes. Lantus and sliding scale 5. Essential hypertension continue usual medications 6. Hyperlipidemia unspecified continue cholesterol medication 7. Gout on allopurinol 8. Glaucoma on latanoprost  Overall appears at baseline. Discharge to home. CONSULTS OBTAINED:  Treatment Team:  Lavonia Dana, MD  DRUG ALLERGIES:  No Known Allergies  DISCHARGE MEDICATIONS:   Current Discharge Medication List    CONTINUE these medications which have NOT CHANGED   Details  allopurinol (ZYLOPRIM) 100 MG tablet Take 1 tablet by mouth daily as needed.    ALPRAZolam (XANAX) 0.25 MG tablet Take 1 tablet by mouth at bedtime as needed.    amitriptyline (ELAVIL) 25 MG tablet Take 1 tablet by mouth at bedtime as needed (For foot pain).  Refills: 3    amLODipine (NORVASC) 10 MG tablet Take 10 mg by mouth daily. Refills: 11    aspirin EC 325 MG tablet Take 325 mg by mouth daily.    atenolol (TENORMIN) 25 MG tablet Take 1 tablet by mouth daily.    !! B Complex-C-Folic Acid (DIALYVITE Q000111Q PO) Take 1 tablet by mouth daily.    cinacalcet (SENSIPAR) 90 MG tablet Take 90 mg by mouth daily.    cloNIDine (  CATAPRES) 0.3 MG tablet Take 1 tablet (0.3 mg total) by mouth 2 (two) times daily. Qty: 60 tablet, Refills: 11    cyclobenzaprine (FLEXERIL) 5 MG tablet Take 1 tablet by mouth 3 (three) times daily as needed.    docusate sodium (COLACE) 100 MG capsule Take 1 capsule (100 mg total) by mouth 2 (two) times daily. Qty: 20 capsule, Refills: 2    fluticasone (FLONASE) 50 MCG/ACT nasal spray Place 2 sprays into both nostrils daily as needed for allergies.     gabapentin (NEURONTIN) 100 MG capsule Take 1 capsule by mouth 3 (three) times daily.    HUMALOG 100 UNIT/ML injection Inject 20-48 Units into the skin 2 (two) times  daily. Pt. Takes 20 units before breakfast and 48 units before supper. Refills: 1    hydrALAZINE (APRESOLINE) 50 MG tablet Take 50 mg by mouth 3 (three) times daily.      HYDROcodone-acetaminophen (NORCO/VICODIN) 5-325 MG tablet Take 1 tablet by mouth every 4 (four) hours as needed for moderate pain. Qty: 20 tablet, Refills: 0    insulin glargine (LANTUS) 100 UNIT/ML injection Inject 0.8 mLs (80 Units total) into the skin at bedtime. Qty: 7 vial, Refills: 3    ipratropium (ATROVENT) 0.03 % nasal spray Place 1 spray into both nostrils 3 (three) times daily as needed for rhinitis.    isosorbide mononitrate (IMDUR) 30 MG 24 hr tablet Take 30 mg by mouth 2 (two) times daily.    lactulose (CHRONULAC) 10 GM/15ML solution Take 15-30 mLs by mouth daily as needed.    latanoprost (XALATAN) 0.005 % ophthalmic solution Place 1 drop into both eyes at bedtime. Refills: 6    losartan (COZAAR) 100 MG tablet Take 100 mg by mouth daily. Refills: 11    metoCLOPramide (REGLAN) 10 MG tablet Take 1 tablet (10 mg total) by mouth every 8 (eight) hours as needed for nausea. Qty: 15 tablet, Refills: 0    !! multivitamin (RENA-VIT) TABS tablet Take 1 tablet by mouth daily.      ondansetron (ZOFRAN) 8 MG tablet Take 8 mg by mouth every 8 (eight) hours as needed for nausea or vomiting.    pantoprazole (PROTONIX) 40 MG tablet Take 40 mg by mouth daily.    polyethylene glycol powder (GLYCOLAX/MIRALAX) powder Take 17 g by mouth daily.    pravastatin (PRAVACHOL) 80 MG tablet Take 1 tablet (80 mg total) by mouth daily. Qty: 90 tablet, Refills: 3    sevelamer (RENVELA) 800 MG tablet Take 800 mg by mouth 3 (three) times daily with meals.       !! - Potential duplicate medications found. Please discuss with provider.      If you experience worsening of your admission symptoms, develop shortness of breath, life threatening emergency, suicidal or homicidal thoughts you must seek medical attention immediately by  calling 911 or calling your MD immediately  if symptoms less severe.  You Must read complete instructions/literature along with all the possible adverse reactions/side effects for all the Medicines you take and that have been prescribed to you. Take any new Medicines after you have completely understood and accept all the possible adverse reactions/side effects.   Please note  You were cared for by a hospitalist during your hospital stay. If you have any questions about your discharge medications or the care you received while you were in the hospital after you are discharged, you can call the unit and asked to speak with the hospitalist on call if the hospitalist that took  care of you is not available. Once you are discharged, your primary care physician will handle any further medical issues. Please note that NO REFILLS for any discharge medications will be authorized once you are discharged, as it is imperative that you return to your primary care physician (or establish a relationship with a primary care physician if you do not have one) for your aftercare needs so that they can reassess your need for medications and monitor your lab values. Today   SUBJECTIVE   Doing well. Denies chest pain tolerating diet.  VITAL SIGNS:  Blood pressure 157/64, pulse 67, temperature 97.7 F (36.5 C), temperature source Oral, resp. rate 18, height 5' 7.5" (1.715 m), weight 100.109 kg (220 lb 11.2 oz), SpO2 100 %.  I/O:   Intake/Output Summary (Last 24 hours) at 04/06/16 1317 Last data filed at 04/06/16 0900  Gross per 24 hour  Intake    240 ml  Output    334 ml  Net    -94 ml    PHYSICAL EXAMINATION:  GENERAL:  75 y.o.-year-old patient lying in the bed with no acute distress.  EYES: Pupils equal, round, reactive to light and accommodation. No scleral icterus. Extraocular muscles intact.  HEENT: Head atraumatic, normocephalic. Oropharynx and nasopharynx clear.  NECK:  Supple, no jugular venous  distention. No thyroid enlargement, no tenderness.  LUNGS: Normal breath sounds bilaterally, no wheezing, rales,rhonchi or crepitation. No use of accessory muscles of respiration.  CARDIOVASCULAR: S1, S2 normal. No murmurs, rubs, or gallops.  ABDOMEN: Soft, non-tender, non-distended. Bowel sounds present. No organomegaly or mass. PD catheter site looks okay EXTREMITIES: No pedal edema, cyanosis, or clubbing.  NEUROLOGIC: Cranial nerves II through XII are intact. Muscle strength 5/5 in all extremities. Sensation intact. Gait not checked.  PSYCHIATRIC: The patient is alert and oriented x 3.  SKIN: No obvious rash, lesion, or ulcer.   DATA REVIEW:   CBC   Recent Labs Lab 04/06/16 0411  WBC 8.7  HGB 8.6*  HCT 25.3*  PLT 184    Chemistries   Recent Labs Lab 04/05/16 1235 04/05/16 2004 04/06/16 0411  NA 141  --  141  K 3.5  --  3.1*  CL 100*  --  99*  CO2 24  --  25  GLUCOSE 163*  --  150*  BUN 54*  --  55*  CREATININE 12.07*  --  11.98*  CALCIUM 7.0*  --  7.0*  MG  --  2.0  --   AST 25  --   --   ALT 26  --   --   ALKPHOS 58  --   --   BILITOT 0.5  --   --     Microbiology Results   No results found for this or any previous visit (from the past 240 hour(s)).  RADIOLOGY:  Dg Chest Port 1 View  04/05/2016  CLINICAL DATA:  Chest pain. EXAM: PORTABLE CHEST 1 VIEW COMPARISON:  July 31, 2015. FINDINGS: The heart size and mediastinal contours are within normal limits. Both lungs are clear. No pneumothorax or pleural effusion is noted. The visualized skeletal structures are unremarkable. IMPRESSION: No acute cardiopulmonary abnormality seen. Electronically Signed   By: Marijo Conception, M.D.   On: 04/05/2016 13:19     Management plans discussed with the patient, family and they are in agreement.  CODE STATUS:     Code Status Orders        Start     Ordered  04/05/16 1614  Full code   Continuous     04/05/16 1614    Code Status History    Date Active Date  Inactive Code Status Order ID Comments User Context   03/14/2016 11:50 PM 03/16/2016  3:34 PM Full Code [id number]  Max Sane, MD Inpatient      TOTAL TIME TAKING CARE OF THIS PATIENT: 40 minutes.    Reynolds Kittel M.D on 04/06/2016 at 1:17 PM  Between 7am to 6pm - Pager - [phone] After 6pm go to [email or web address] - password [email or web address] [facility]  Office  [phone]  CC: Primary care physician; [name], MD

## 2016-04-06 NOTE — Care Management (Signed)
Placed in observation for chest pain.  Current PD therapy through [facility] [place] in all adls, denies issues accessing medical care, obtaining medications or with transportation.  Current with her PCP.  No discharge needs identified at present by care manager or members of care team.  Notified dialysis coordinator.

## 2016-04-06 NOTE — Progress Notes (Signed)
Inpatient Diabetes Program Recommendations  AACE/ADA: New Consensus Statement on Inpatient Glycemic Control (2015)  Target Ranges:  Prepandial:   less than 140 mg/dL      Peak postprandial:   less than 180 mg/dL (1-2 hours)      Critically ill patients:  140 - 180 mg/dL   Results for Laurie Steele, Laurie Steele (MRN [id number]) as of 04/06/2016 09:34  Ref. Range 04/05/2016 22:38 04/06/2016 07:31  Glucose-Capillary Latest Ref Range: 65-99 mg/dL 265 (H) 106 (H)    Admit with: CP  History: DM, ESRD  Home DM Meds: Lantus 70 units QHS       Humalog 20 units QAM with breakfast/ 48 units QPM with supper  Current Insulin Orders: Lantus 50 units QHS      MD- Please start Novolog Sensitive Correction Scale/ SSI (0-9 units) TID AC + HS      --Will follow patient during hospitalization--  Wyn Quaker RN, MSN, CDE Diabetes Coordinator Inpatient Glycemic Control Team Team Pager: [phone] (8a-5p)

## 2016-04-06 NOTE — Discharge Instructions (Signed)
Resume your PD as before 

## 2016-04-06 NOTE — Progress Notes (Signed)
Post dialysis assessment 

## 2016-04-06 NOTE — Progress Notes (Signed)
Subjective:   Admitted for chest pain.  Received peritoneal dialysis last night. Tolerated well. UF of 159   Objective:  Vital signs in last 24 hours:  Temp:  [97.7 F (36.5 C)-98.9 F (37.2 C)] 97.7 F (36.5 C) (06/02 1210) Pulse Rate:  [64-81] 67 (06/02 1210) Resp:  [17-25] 18 (06/02 1210) BP: (129-157)/(46-71) 157/64 mmHg (06/02 1210) SpO2:  [94 %-100 %] 100 % (06/02 1210) Weight:  [100.109 kg (220 lb 11.2 oz)] 100.109 kg (220 lb 11.2 oz) (06/02 0631)  Weight change:  Filed Weights   04/05/16 1238 04/06/16 0631  Weight: 97.523 kg (215 lb) 100.109 kg (220 lb 11.2 oz)    Intake/Output:    Intake/Output Summary (Last 24 hours) at 04/06/16 1246 Last data filed at 04/06/16 0900  Gross per 24 hour  Intake    240 ml  Output    334 ml  Net    -94 ml     Physical Exam: General: No acute distress, laying in the bed  HEENT anicteric, moist mucous membranes  Neck supple  Pulm/lungs Clear auscultation bilaterally  CVS/Heart Regular rhythm, no rub or gallop  Abdomen:  Soft, nontender  Extremities: Trace peripheral edema  Neurologic: Alert, oriented  Skin: No acute rashes  Access: PD catheter, exit site nontender       Basic Metabolic Panel:   Recent Labs Lab 04/05/16 1235 04/05/16 2004 04/06/16 0411  NA 141  --  141  K 3.5  --  3.1*  CL 100*  --  99*  CO2 24  --  25  GLUCOSE 163*  --  150*  BUN 54*  --  55*  CREATININE 12.07*  --  11.98*  CALCIUM 7.0*  --  7.0*  MG  --  2.0  --      CBC:  Recent Labs Lab 04/05/16 1235 04/06/16 0411  WBC 13.1* 8.7  HGB 9.4* 8.6*  HCT 28.2* 25.3*  MCV 105.8* 108.3*  PLT 228 184      Microbiology:  No results found for this or any previous visit (from the past 720 hour(s)).  Coagulation Studies: No results for input(s): LABPROT, INR in the last 72 hours.  Urinalysis: No results for input(s): COLORURINE, LABSPEC, PHURINE, GLUCOSEU, HGBUR, BILIRUBINUR, KETONESUR, PROTEINUR, UROBILINOGEN, NITRITE,  LEUKOCYTESUR in the last 72 hours.  Invalid input(s): APPERANCEUR    Imaging: Dg Chest Port 1 View  04/05/2016  CLINICAL DATA:  Chest pain. EXAM: PORTABLE CHEST 1 VIEW COMPARISON:  July 31, 2015. FINDINGS: The heart size and mediastinal contours are within normal limits. Both lungs are clear. No pneumothorax or pleural effusion is noted. The visualized skeletal structures are unremarkable. IMPRESSION: No acute cardiopulmonary abnormality seen. Electronically Signed   By: Marijo Conception, M.D.   On: 04/05/2016 13:19     Medications:     . allopurinol  100 mg Oral Daily  . amLODipine  10 mg Oral Daily  . aspirin EC  81 mg Oral Daily  . atenolol  25 mg Oral Daily  . cinacalcet  90 mg Oral Q breakfast  . cloNIDine  0.3 mg Oral BID  . dialysis solution 1.5% low-MG/low-CA   Intraperitoneal Q24H  . docusate sodium  100 mg Oral BID  . gabapentin  100 mg Oral TID  . gentamicin cream  1 application Topical Daily  . heparin  5,000 Units Subcutaneous Q8H  . hydrALAZINE  50 mg Oral TID  . insulin glargine  50 Units Subcutaneous QHS  . ipratropium  1 spray Each Nare BID  . isosorbide mononitrate  30 mg Oral BID  . latanoprost  1 drop Both Eyes QHS  . losartan  100 mg Oral Daily  .  morphine injection  4 mg Intravenous Once  . multivitamin  1 tablet Oral Daily  . pantoprazole  40 mg Oral BID  . polyethylene glycol  17 g Oral Daily  . pravastatin  80 mg Oral QHS  . sevelamer carbonate  800 mg Oral TID WC  . sodium chloride flush  3 mL Intravenous Q12H   acetaminophen **OR** acetaminophen, ALPRAZolam, amitriptyline, cyclobenzaprine, fluticasone, HYDROcodone-acetaminophen, lactulose, metoCLOPramide, ondansetron  Assessment/ Plan:  75 y.o. female with end-stage renal disease, hypertension, diabetes presents for symptomatic hypercalcemia  1.  End-stage renal disease: tolerating peritoneal dialysis treatment well.  We will continue her outpatient PD prescription out 1700 cc x 6 cycles.  8  hours Use 1.5% adn 2.5% solution  2.  Hypertension: elevated - home regimen of amlodipine, clonidine, and atenolol.   3. Secondary Hyperparathyroidism with history of hypercalcemia: Calcium is actually low.  - off calcitriol - discontinue cinacalcet  4. Anemia of chronic kidney disease: hemoglobin 8.6 - hold epo due to possible ischemic event.       LOS:  Laurie Steele 6/2/201712:46 PM

## 2016-04-27 ENCOUNTER — Ambulatory Visit
Admission: RE | Admit: 2016-04-27 | Discharge: 2016-04-27 | Disposition: A | Discharge status: Home / Self Care | Payer: Medicare Other | Source: Ambulatory Visit | Attending: Nephrology | Admitting: Nephrology

## 2016-04-27 ENCOUNTER — Other Ambulatory Visit: Payer: Self-pay | Admitting: Nephrology

## 2016-04-27 DIAGNOSIS — T82838A Hemorrhage of vascular prosthetic devices, implants and grafts, initial encounter: Secondary | ICD-10-CM

## 2016-04-27 DIAGNOSIS — R918 Other nonspecific abnormal finding of lung field: Secondary | ICD-10-CM | POA: Insufficient documentation

## 2016-04-27 DIAGNOSIS — T859XXA Unspecified complication of internal prosthetic device, implant and graft, initial encounter: Secondary | ICD-10-CM | POA: Diagnosis not present

## 2016-04-27 DIAGNOSIS — I517 Cardiomegaly: Secondary | ICD-10-CM | POA: Insufficient documentation

## 2016-05-02 ENCOUNTER — Other Ambulatory Visit: Payer: Self-pay | Admitting: Vascular Surgery

## 2016-05-02 NOTE — ED Notes (Signed)
ED ECG REPORT I, Doran Stabler, the attending physician, personally viewed and interpreted this ECG.   Date: 05/10/02017  EKG Time: 2208  Rate: 77  Rhythm: normal sinus rhythm with PVC times one  Axis: Normal axis  Intervals:none  ST&T Change: No ST elevations or depressions. No abnormal T-wave inversions.   Orbie Pyo, MD 05/02/16 [phone]

## 2016-05-02 NOTE — ED Provider Notes (Signed)
ED ECG REPORT I, Doran Stabler, the attending physician, personally viewed and interpreted this ECG.   Date: 03/14/2016  EKG Time: 2208  Rate: 77  Rhythm: normal sinus rhythm with PVC times one  Axis: Normal axis  Intervals:none  ST&T Change: No ST segment elevations or depressions. No abnormal T-wave inversions.   Orbie Pyo, MD 05/02/16 [phone]

## 2016-05-06 ENCOUNTER — Encounter: Payer: Self-pay | Admitting: Emergency Medicine

## 2016-05-06 ENCOUNTER — Emergency Department
Admission: EM | Admit: 2016-05-06 | Discharge: 2016-05-06 | Disposition: A | Discharge status: Home / Self Care | Payer: Medicare Other | Attending: Emergency Medicine | Admitting: Emergency Medicine

## 2016-05-06 DIAGNOSIS — Z87891 Personal history of nicotine dependence: Secondary | ICD-10-CM | POA: Diagnosis not present

## 2016-05-06 DIAGNOSIS — M2550 Pain in unspecified joint: Secondary | ICD-10-CM

## 2016-05-06 DIAGNOSIS — I120 Hypertensive chronic kidney disease with stage 5 chronic kidney disease or end stage renal disease: Secondary | ICD-10-CM | POA: Diagnosis not present

## 2016-05-06 DIAGNOSIS — Z7984 Long term (current) use of oral hypoglycemic drugs: Secondary | ICD-10-CM | POA: Diagnosis not present

## 2016-05-06 DIAGNOSIS — Z7951 Long term (current) use of inhaled steroids: Secondary | ICD-10-CM | POA: Diagnosis not present

## 2016-05-06 DIAGNOSIS — Z992 Dependence on renal dialysis: Secondary | ICD-10-CM | POA: Diagnosis not present

## 2016-05-06 DIAGNOSIS — E1122 Type 2 diabetes mellitus with diabetic chronic kidney disease: Secondary | ICD-10-CM | POA: Diagnosis not present

## 2016-05-06 DIAGNOSIS — Z79899 Other long term (current) drug therapy: Secondary | ICD-10-CM | POA: Diagnosis not present

## 2016-05-06 DIAGNOSIS — E11319 Type 2 diabetes mellitus with unspecified diabetic retinopathy without macular edema: Secondary | ICD-10-CM | POA: Diagnosis not present

## 2016-05-06 DIAGNOSIS — E1140 Type 2 diabetes mellitus with diabetic neuropathy, unspecified: Secondary | ICD-10-CM | POA: Insufficient documentation

## 2016-05-06 DIAGNOSIS — N186 End stage renal disease: Secondary | ICD-10-CM | POA: Diagnosis not present

## 2016-05-06 DIAGNOSIS — E785 Hyperlipidemia, unspecified: Secondary | ICD-10-CM | POA: Insufficient documentation

## 2016-05-06 DIAGNOSIS — Z794 Long term (current) use of insulin: Secondary | ICD-10-CM | POA: Insufficient documentation

## 2016-05-06 DIAGNOSIS — R52 Pain, unspecified: Secondary | ICD-10-CM | POA: Insufficient documentation

## 2016-05-06 LAB — COMPREHENSIVE METABOLIC PANEL
ALK PHOS: 82 U/L (ref 38–126)
ALT: 38 U/L (ref 14–54)
ANION GAP: 22 — AB (ref 5–15)
AST: 28 U/L (ref 15–41)
Albumin: 3.2 g/dL — ABNORMAL LOW (ref 3.5–5.0)
BUN: 86 mg/dL — ABNORMAL HIGH (ref 6–20)
CALCIUM: 9.9 mg/dL (ref 8.9–10.3)
CO2: 22 mmol/L (ref 22–32)
Chloride: 95 mmol/L — ABNORMAL LOW (ref 101–111)
Creatinine, Ser: 14.69 mg/dL — ABNORMAL HIGH (ref 0.44–1.00)
GFR calc non Af Amer: 2 mL/min — ABNORMAL LOW (ref >60)
GFR, EST AFRICAN AMERICAN: 2 mL/min — AB (ref >60)
Glucose, Bld: 164 mg/dL — ABNORMAL HIGH (ref 65–99)
POTASSIUM: 5 mmol/L (ref 3.5–5.1)
SODIUM: 139 mmol/L (ref 135–145)
Total Bilirubin: 0.4 mg/dL (ref 0.3–1.2)
Total Protein: 6.6 g/dL (ref 6.5–8.1)

## 2016-05-06 LAB — CBC
HCT: 24.7 % — ABNORMAL LOW (ref 35.0–47.0)
HEMOGLOBIN: 8.4 g/dL — AB (ref 12.0–16.0)
MCH: 35.3 pg — AB (ref 26.0–34.0)
MCHC: 33.8 g/dL (ref 32.0–36.0)
MCV: 104.5 fL — ABNORMAL HIGH (ref 80.0–100.0)
PLATELETS: 180 10*3/uL (ref 150–440)
RBC: 2.36 MIL/uL — ABNORMAL LOW (ref 3.80–5.20)
RDW: 16 % — AB (ref 11.5–14.5)
WBC: 8.6 10*3/uL (ref 3.6–11.0)

## 2016-05-06 LAB — SEDIMENTATION RATE: Sed Rate: 108 mm/hr — ABNORMAL HIGH (ref 0–30)

## 2016-05-06 MED ORDER — DEXAMETHASONE SODIUM PHOSPHATE 10 MG/ML IJ SOLN
10.0000 mg | Freq: Once | INTRAMUSCULAR | Status: AC
  Administered 2016-05-06: 10 mg via INTRAMUSCULAR
  Filled 2016-05-06: qty 1

## 2016-05-06 MED ORDER — DEXAMETHASONE SODIUM PHOSPHATE 10 MG/ML IJ SOLN
INTRAMUSCULAR | Status: AC
  Administered 2016-05-06: 10 mg via INTRAMUSCULAR
  Filled 2016-05-06: qty 1

## 2016-05-06 NOTE — ED Provider Notes (Signed)
[facility] Emergency Department Provider Note  ____________________________________________    I have reviewed the triage vital signs and the nursing notes.   HISTORY  Chief Complaint Pain and Gout    HPI Laurie Steele is a 75 y.o. female who presents with complaints of multiple joint pains. Patient reports primarily her right elbow has been sore and her primary care physician diagnosed her with possible gout. She has been taking her medications with little relief in her pain. She also complains of pain in her knees and feet and lower back as well all of which started approximately 2 weeks ago at the same time as her elbow. She denies fevers or chills. No dizziness. She has never had this before. No injury. She does have chronic kidney disease and does peritoneal dialysis. No recent travel.     Past Medical History  Diagnosis Date  . Hx of colonic polyps   . Hyperlipidemia   . Hypertension   . Renal failure     Dr Holley Raring  . 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 Chronic anemia     Dr Inez Pilgrim  . Glaucoma   . GERD (gastroesophageal reflux disease)   . Gout   . Miscarriage   . Vaginal delivery     x 4  . Peritoneal dialysis status [facility])     Patient Active Problem List   Diagnosis Date Noted  . Chest pain 04/05/2016  . Hypercalcemia 03/14/2016  . Bronchitis 03/03/2012  . Type II or unspecified type diabetes mellitus with renal manifestations, not stated as uncontrolled 12/18/2011  . END STAGE RENAL DISEASE 10/23/2010  . NEUROPATHY 12/13/2009  . GOUT 02/15/2009  . SLEEP DISORDER 02/15/2009  . GERD 02/23/2008  . HYPERLIPIDEMIA 05/14/2007  . ANEMIA, CHRONIC 05/14/2007  . HYPERTENSION 05/14/2007    Past Surgical History  Procedure Laterality Date  . Cholecystectomy    .  Abdominal hysterectomy  1983  . Back surgery  6/08    Dr Collier Salina  . Back surgery  1987    disc    Current Outpatient Rx  Name  Route  Sig  Dispense  Refill  . allopurinol (ZYLOPRIM) 100 MG tablet   Oral   Take 1 tablet by mouth daily as needed.         . ALPRAZolam (XANAX) 0.25 MG tablet   Oral   Take 1 tablet by mouth at bedtime as needed.         Marland Kitchen amitriptyline (ELAVIL) 25 MG tablet   Oral   Take 1 tablet by mouth at bedtime as needed (For foot pain).       3   . amLODipine (NORVASC) 10 MG tablet   Oral   Take 10 mg by mouth daily.      11   . aspirin EC 325 MG tablet   Oral   Take 325 mg by mouth daily.         Marland Kitchen atenolol (TENORMIN) 25 MG tablet   Oral   Take 1 tablet by mouth daily.         . B Complex-C-Folic Acid (DIALYVITE 832 PO)   Oral   Take 1 tablet by mouth daily.         . cinacalcet (SENSIPAR) 90 MG tablet   Oral  Take 90 mg by mouth daily.         . cloNIDine (CATAPRES) 0.3 MG tablet   Oral   Take 1 tablet (0.3 mg total) by mouth 2 (two) times daily.   60 tablet   11   . cyclobenzaprine (FLEXERIL) 5 MG tablet   Oral   Take 1 tablet by mouth 3 (three) times daily as needed.         . docusate sodium (COLACE) 100 MG capsule   Oral   Take 1 capsule (100 mg total) by mouth 2 (two) times daily.   20 capsule   2   . fluticasone (FLONASE) 50 MCG/ACT nasal spray   Each Nare   Place 2 sprays into both nostrils daily as needed for allergies.          Marland Kitchen gabapentin (NEURONTIN) 100 MG capsule   Oral   Take 1 capsule by mouth 3 (three) times daily.         Marland Kitchen HUMALOG 100 UNIT/ML injection   Subcutaneous   Inject 20-48 Units into the skin 2 (two) times daily. Pt. Takes 20 units before breakfast and 48 units before supper.      1     Dispense as written.   . hydrALAZINE (APRESOLINE) 50 MG tablet   Oral   Take 50 mg by mouth 3 (three) times daily.           Marland Kitchen HYDROcodone-acetaminophen (NORCO/VICODIN) 5-325 MG  tablet   Oral   Take 1 tablet by mouth every 4 (four) hours as needed for moderate pain.   20 tablet   0   . insulin glargine (LANTUS) 100 UNIT/ML injection   Subcutaneous   Inject 0.8 mLs (80 Units total) into the skin at bedtime. Patient taking differently: Inject 70 Units into the skin at bedtime.    7 vial   3   . ipratropium (ATROVENT) 0.03 % nasal spray   Each Nare   Place 1 spray into both nostrils 3 (three) times daily as needed for rhinitis.         Marland Kitchen isosorbide mononitrate (IMDUR) 30 MG 24 hr tablet   Oral   Take 30 mg by mouth 2 (two) times daily.         Marland Kitchen lactulose (CHRONULAC) 10 GM/15ML solution   Oral   Take 15-30 mLs by mouth daily as needed.         . latanoprost (XALATAN) 0.005 % ophthalmic solution   Both Eyes   Place 1 drop into both eyes at bedtime.      6   . losartan (COZAAR) 100 MG tablet   Oral   Take 100 mg by mouth daily.      11   . metoCLOPramide (REGLAN) 10 MG tablet   Oral   Take 1 tablet (10 mg total) by mouth every 8 (eight) hours as needed for nausea.   15 tablet   0   . multivitamin (RENA-VIT) TABS tablet   Oral   Take 1 tablet by mouth daily.           . ondansetron (ZOFRAN) 8 MG tablet   Oral   Take 8 mg by mouth every 8 (eight) hours as needed for nausea or vomiting.         . pantoprazole (PROTONIX) 40 MG tablet   Oral   Take 40 mg by mouth daily.         . polyethylene glycol powder (GLYCOLAX/MIRALAX) powder  Oral   Take 17 g by mouth daily.         . pravastatin (PRAVACHOL) 80 MG tablet   Oral   Take 1 tablet (80 mg total) by mouth daily. Patient taking differently: Take 80 mg by mouth at bedtime.    90 tablet   3   . sevelamer (RENVELA) 800 MG tablet   Oral   Take 800 mg by mouth 3 (three) times daily with meals.             Allergies Review of patient's allergies indicates no known allergies.  Family History  Problem Relation Age of Onset  . Heart attack Father   . Hypertension  Mother   . Hyperlipidemia Mother   . Coronary artery disease      strong fam hx  . Breast cancer Neg Hx     Social History Social History  Substance Use Topics  . Smoking status: Former Smoker    Quit date: 11/05/1990  . Smokeless tobacco: Never Used  . Alcohol Use: No    Review of Systems  Constitutional: Negative for fever. Eyes: Negative for redness ENT: Negative for sore throat Cardiovascular: Negative for chest pain Respiratory: Negative for shortness of breath. Gastrointestinal: Negative forNausea or vomiting Genitourinary: Negative for dysuria. Musculoskeletal: As above Skin: Negative for rash. Neurological: Negative for focal weakness, no headache Psychiatric: no anxiety    ____________________________________________   PHYSICAL EXAM:  VITAL SIGNS: ED Triage Vitals  Enc Vitals Group     BP 05/06/16 1520 116/46 mmHg     Pulse Rate 05/06/16 1520 96     Resp 05/06/16 1520 18     Temp --      Temp src --      SpO2 05/06/16 1520 98 %     Weight 05/06/16 1520 212 lb (96.163 kg)     Height 05/06/16 1520 5' 5"  (1.651 m)     Head Cir --      Peak Flow --      Pain Score 05/06/16 1518 4     Pain Loc --      Pain Edu? --      Excl. in [place]? --      Constitutional: Alert and oriented. Well appearing and in no distress.  Eyes: Conjunctivae are normal. No erythema or injection ENT   Head: Normocephalic and atraumatic.   Mouth/Throat: Mucous membranes are moist. Cardiovascular: Normal rate, regular rhythm. Normal and symmetric distal pulses are present in the upper extremities. Respiratory: Normal respiratory effort without tachypnea nor retractions. Breath sounds are clear and equal bilaterally.  Gastrointestinal: Soft and non-tender in all quadrants. No distention. There is no CVA tenderness. Genitourinary: deferred Musculoskeletal: Nontender with normal range of motion in all extremities. Patient is able to range her right elbow without difficulty, no  significant swelling, no bony tenderness to palpation. No erythema. Neurologic:  Normal speech and language. No gross focal neurologic deficits are appreciated. Skin:  Skin is warm, dry and intact. No rash noted. Psychiatric: Mood and affect are normal. Patient exhibits appropriate insight and judgment.  ____________________________________________    LABS (pertinent positives/negatives)  Labs Reviewed  CBC - Abnormal; Notable for the following:    RBC 2.36 (*)    Hemoglobin 8.4 (*)    HCT 24.7 (*)    MCV 104.5 (*)    MCH 35.3 (*)    RDW 16.0 (*)    All other components within normal limits  COMPREHENSIVE METABOLIC PANEL - Abnormal; Notable  for the following:    Chloride 95 (*)    Glucose, Bld 164 (*)    BUN 86 (*)    Creatinine, Ser 14.69 (*)    Albumin 3.2 (*)    GFR calc non Af Amer 2 (*)    GFR calc Af Amer 2 (*)    Anion gap 22 (*)    All other components within normal limits  SEDIMENTATION RATE - Abnormal; Notable for the following:    Sed Rate 108 (*)    All other components within normal limits    ____________________________________________   EKG  None  ____________________________________________    RADIOLOGY  None  ____________________________________________   PROCEDURES  Procedure(s) performed: none  Critical Care performed: none  ____________________________________________   INITIAL IMPRESSION / ASSESSMENT AND PLAN / ED COURSE  Pertinent labs & imaging results that were available during my care of the patient were reviewed by me and considered in my medical decision making (see chart for details).  Patient with multiple joint symptoms of unclear etiology. Not consistent with septic joint or sepsis. ESR is significantly elevated, this may be affected by her kidney disease. I will treat her with IM Decadron and have her follow-up with rheumatology for further workup.  ____________________________________________   FINAL CLINICAL  IMPRESSION(S) / ED DIAGNOSES  Final diagnoses:  Pain, joint, multiple sites          Lavonia Drafts, MD 05/06/16 [phone]

## 2016-05-06 NOTE — Discharge Instructions (Signed)

## 2016-05-06 NOTE — ED Notes (Signed)
C/O right arm, right elbow, back, and bilateral knee pain x 2 weeks.  Seen by PCP for same complaints about 2 weeks ago and patient was diagnosed with Gout.  Patient states pain persists.

## 2016-05-06 NOTE — ED Notes (Addendum)
Patient seen by her PCP on 04/26/16 and was diagnosed with gout. Patient was given Allopurinol, Norco, and Amitriptyline for pain. Patient states that she is still having pain in her right elbow, neck, both knees, and both feet. Patient states that pain has not really improved any since seeing her PCP.   Patient reports difficulty with ambulation due to the pain in her feet.

## 2016-05-11 ENCOUNTER — Emergency Department
Admission: EM | Admit: 2016-05-11 | Discharge: 2016-05-11 | Disposition: A | Discharge status: Home / Self Care | Payer: Medicare Other | Attending: Emergency Medicine | Admitting: Emergency Medicine

## 2016-05-11 ENCOUNTER — Emergency Department: Payer: Medicare Other

## 2016-05-11 DIAGNOSIS — Z87891 Personal history of nicotine dependence: Secondary | ICD-10-CM | POA: Diagnosis not present

## 2016-05-11 DIAGNOSIS — Z86010 Personal history of colonic polyps: Secondary | ICD-10-CM | POA: Diagnosis not present

## 2016-05-11 DIAGNOSIS — Z992 Dependence on renal dialysis: Secondary | ICD-10-CM | POA: Diagnosis not present

## 2016-05-11 DIAGNOSIS — E785 Hyperlipidemia, unspecified: Secondary | ICD-10-CM | POA: Insufficient documentation

## 2016-05-11 DIAGNOSIS — E11319 Type 2 diabetes mellitus with unspecified diabetic retinopathy without macular edema: Secondary | ICD-10-CM | POA: Insufficient documentation

## 2016-05-11 DIAGNOSIS — Z7982 Long term (current) use of aspirin: Secondary | ICD-10-CM | POA: Insufficient documentation

## 2016-05-11 DIAGNOSIS — Z7951 Long term (current) use of inhaled steroids: Secondary | ICD-10-CM | POA: Diagnosis not present

## 2016-05-11 DIAGNOSIS — R079 Chest pain, unspecified: Secondary | ICD-10-CM

## 2016-05-11 DIAGNOSIS — Z794 Long term (current) use of insulin: Secondary | ICD-10-CM | POA: Diagnosis not present

## 2016-05-11 DIAGNOSIS — I120 Hypertensive chronic kidney disease with stage 5 chronic kidney disease or end stage renal disease: Secondary | ICD-10-CM | POA: Insufficient documentation

## 2016-05-11 DIAGNOSIS — N186 End stage renal disease: Secondary | ICD-10-CM | POA: Diagnosis not present

## 2016-05-11 DIAGNOSIS — R0789 Other chest pain: Secondary | ICD-10-CM | POA: Insufficient documentation

## 2016-05-11 DIAGNOSIS — Z79899 Other long term (current) drug therapy: Secondary | ICD-10-CM | POA: Insufficient documentation

## 2016-05-11 DIAGNOSIS — Z8719 Personal history of other diseases of the digestive system: Secondary | ICD-10-CM | POA: Insufficient documentation

## 2016-05-11 DIAGNOSIS — E1122 Type 2 diabetes mellitus with diabetic chronic kidney disease: Secondary | ICD-10-CM | POA: Insufficient documentation

## 2016-05-11 LAB — BASIC METABOLIC PANEL
Anion gap: 24 — ABNORMAL HIGH (ref 5–15)
BUN: 96 mg/dL — AB (ref 6–20)
CO2: 19 mmol/L — ABNORMAL LOW (ref 22–32)
CREATININE: 14.47 mg/dL — AB (ref 0.44–1.00)
Calcium: 10 mg/dL (ref 8.9–10.3)
Chloride: 94 mmol/L — ABNORMAL LOW (ref 101–111)
GFR calc Af Amer: 2 mL/min — ABNORMAL LOW (ref >60)
GFR, EST NON AFRICAN AMERICAN: 2 mL/min — AB (ref >60)
GLUCOSE: 220 mg/dL — AB (ref 65–99)
POTASSIUM: 5 mmol/L (ref 3.5–5.1)
SODIUM: 137 mmol/L (ref 135–145)

## 2016-05-11 LAB — CBC
HEMATOCRIT: 25.4 % — AB (ref 35.0–47.0)
Hemoglobin: 8.6 g/dL — ABNORMAL LOW (ref 12.0–16.0)
MCH: 35.6 pg — ABNORMAL HIGH (ref 26.0–34.0)
MCHC: 33.9 g/dL (ref 32.0–36.0)
MCV: 105.1 fL — AB (ref 80.0–100.0)
PLATELETS: 211 10*3/uL (ref 150–440)
RBC: 2.41 MIL/uL — ABNORMAL LOW (ref 3.80–5.20)
RDW: 15.9 % — AB (ref 11.5–14.5)
WBC: 10.7 10*3/uL (ref 3.6–11.0)

## 2016-05-11 LAB — TROPONIN I
TROPONIN I: 0.05 ng/mL — AB (ref <0.03)
Troponin I: 0.05 ng/mL (ref <0.03)

## 2016-05-11 MED ORDER — GI COCKTAIL ~~LOC~~
30.0000 mL | Freq: Once | ORAL | Status: AC
  Administered 2016-05-11: 30 mL via ORAL
  Filled 2016-05-11: qty 30

## 2016-05-11 MED ORDER — HYDROCODONE-ACETAMINOPHEN 5-325 MG PO TABS
1.0000 | ORAL_TABLET | Freq: Once | ORAL | Status: AC
  Administered 2016-05-11: 1 via ORAL
  Filled 2016-05-11: qty 1

## 2016-05-11 MED ORDER — GABAPENTIN 100 MG PO CAPS
100.0000 mg | ORAL_CAPSULE | Freq: Once | ORAL | Status: AC
  Administered 2016-05-11: 100 mg via ORAL
  Filled 2016-05-11: qty 1

## 2016-05-11 NOTE — ED Notes (Signed)
Pt son called to come and take pt home after discharge

## 2016-05-11 NOTE — ED Notes (Signed)
Pt son would like to be called with status to pick up pt or if admitted - His name is [name] number is [phone] - Daughter number is [phone] name is Melody

## 2016-05-11 NOTE — ED Notes (Signed)
Pt arrived to ED via [facility] c/o SOB. Per EMS pt receives peritoneal dialysis every night, and gets her labs checked monthly; this afternoon while waiting to go get her labs pt begin to feel short of breath and started to have chest pain. Pt reports taking 325 of ASA, and EMS administered 1 nitro. Pt alert and oriented in no acute distress at this time.

## 2016-05-11 NOTE — ED Notes (Signed)
Called pharmacy to obtain gabapentin

## 2016-05-11 NOTE — ED Provider Notes (Addendum)
[facility] Emergency Department Provider Note  Time seen: 3:27 PM  I have reviewed the triage vital signs and the nursing notes.   HISTORY  Chief Complaint Shortness of Breath and Chest Pain    HPI [name] is a 75 y.o. female with a past medical history of hyperlipidemia, hypertension, renal failure on nightly peritoneal dialysis, diabetes, who presents to the emergency department for chest pain. According to the patient around 1 PM today she developed chest tightness with shortness of breath. States it lasted approximately one hour, largely resolved at this time. States he was moderate chest discomfort which she describes as a tightness sensation. Denies any nausea or diaphoresis. Patient states she does not get chest pain often. EMS dosed the patient nitroglycerin and aspirin. She states the chest pain was resolved by time she arrived to the emergency department.     Past Medical History  Diagnosis Date  . Hx of colonic polyps   . Hyperlipidemia   . Hypertension   . Renal failure     Dr Holley Raring  . 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 Chronic anemia     Dr Inez Pilgrim  . Glaucoma   . GERD (gastroesophageal reflux disease)   . Gout   . Miscarriage   . Vaginal delivery     x 4  . Peritoneal dialysis status [facility])     Patient Active Problem List   Diagnosis Date Noted  . Chest pain 04/05/2016  . Hypercalcemia 03/14/2016  . Bronchitis 03/03/2012  . Type II or unspecified type diabetes mellitus with renal manifestations, not stated as uncontrolled 12/18/2011  . END STAGE RENAL DISEASE 10/23/2010  . NEUROPATHY 12/13/2009  . GOUT 02/15/2009  . SLEEP DISORDER 02/15/2009  . GERD 02/23/2008  . HYPERLIPIDEMIA 05/14/2007  . ANEMIA, CHRONIC 05/14/2007  . HYPERTENSION 05/14/2007     Past Surgical History  Procedure Laterality Date  . Cholecystectomy    . Abdominal hysterectomy  1983  . Back surgery  6/08    Dr Collier Salina  . Back surgery  1987    disc    Current Outpatient Rx  Name  Route  Sig  Dispense  Refill  . allopurinol (ZYLOPRIM) 100 MG tablet   Oral   Take 1 tablet by mouth daily as needed.         . ALPRAZolam (XANAX) 0.25 MG tablet   Oral   Take 1 tablet by mouth at bedtime as needed.         Marland Kitchen amitriptyline (ELAVIL) 25 MG tablet   Oral   Take 1 tablet by mouth at bedtime as needed (For foot pain).       3   . amLODipine (NORVASC) 10 MG tablet   Oral   Take 10 mg by mouth daily.      11   . aspirin EC 325 MG tablet   Oral   Take 325 mg by mouth daily.         Marland Kitchen atenolol (TENORMIN) 25 MG tablet   Oral   Take 1 tablet by mouth daily.         . B Complex-C-Folic Acid (DIALYVITE Q000111Q PO)   Oral   Take 1 tablet by mouth daily.         . cinacalcet (SENSIPAR)  90 MG tablet   Oral   Take 90 mg by mouth daily.         . cloNIDine (CATAPRES) 0.3 MG tablet   Oral   Take 1 tablet (0.3 mg total) by mouth 2 (two) times daily.   60 tablet   11   . cyclobenzaprine (FLEXERIL) 5 MG tablet   Oral   Take 1 tablet by mouth 3 (three) times daily as needed.         . docusate sodium (COLACE) 100 MG capsule   Oral   Take 1 capsule (100 mg total) by mouth 2 (two) times daily.   20 capsule   2   . fluticasone (FLONASE) 50 MCG/ACT nasal spray   Each Nare   Place 2 sprays into both nostrils daily as needed for allergies.          Marland Kitchen gabapentin (NEURONTIN) 100 MG capsule   Oral   Take 1 capsule by mouth 3 (three) times daily.         Marland Kitchen HUMALOG 100 UNIT/ML injection   Subcutaneous   Inject 20-48 Units into the skin 2 (two) times daily. Pt. Takes 20 units before breakfast and 48 units before supper.      1     Dispense as written.   . hydrALAZINE (APRESOLINE) 50 MG tablet   Oral   Take 50 mg by mouth 3 (three)  times daily.           Marland Kitchen HYDROcodone-acetaminophen (NORCO/VICODIN) 5-325 MG tablet   Oral   Take 1 tablet by mouth every 4 (four) hours as needed for moderate pain.   20 tablet   0   . insulin glargine (LANTUS) 100 UNIT/ML injection   Subcutaneous   Inject 0.8 mLs (80 Units total) into the skin at bedtime. Patient taking differently: Inject 70 Units into the skin at bedtime.    7 vial   3   . ipratropium (ATROVENT) 0.03 % nasal spray   Each Nare   Place 1 spray into both nostrils 3 (three) times daily as needed for rhinitis.         Marland Kitchen isosorbide mononitrate (IMDUR) 30 MG 24 hr tablet   Oral   Take 30 mg by mouth 2 (two) times daily.         Marland Kitchen lactulose (CHRONULAC) 10 GM/15ML solution   Oral   Take 15-30 mLs by mouth daily as needed.         . latanoprost (XALATAN) 0.005 % ophthalmic solution   Both Eyes   Place 1 drop into both eyes at bedtime.      6   . losartan (COZAAR) 100 MG tablet   Oral   Take 100 mg by mouth daily.      11   . metoCLOPramide (REGLAN) 10 MG tablet   Oral   Take 1 tablet (10 mg total) by mouth every 8 (eight) hours as needed for nausea.   15 tablet   0   . multivitamin (RENA-VIT) TABS tablet   Oral   Take 1 tablet by mouth daily.           . ondansetron (ZOFRAN) 8 MG tablet   Oral   Take 8 mg by mouth every 8 (eight) hours as needed for nausea or vomiting.         . pantoprazole (PROTONIX) 40 MG tablet   Oral   Take 40 mg by mouth daily.         Marland Kitchen  polyethylene glycol powder (GLYCOLAX/MIRALAX) powder   Oral   Take 17 g by mouth daily.         . pravastatin (PRAVACHOL) 80 MG tablet   Oral   Take 1 tablet (80 mg total) by mouth daily. Patient taking differently: Take 80 mg by mouth at bedtime.    90 tablet   3   . sevelamer (RENVELA) 800 MG tablet   Oral   Take 800 mg by mouth 3 (three) times daily with meals.             Allergies Review of patient's allergies indicates no known allergies.  Family  History  Problem Relation Age of Onset  . Heart attack Father   . Hypertension Mother   . Hyperlipidemia Mother   . Coronary artery disease      strong fam hx  . Breast cancer Neg Hx     Social History Social History  Substance Use Topics  . Smoking status: Former Smoker    Quit date: 11/05/1990  . Smokeless tobacco: Never Used  . Alcohol Use: No    Review of Systems Constitutional: Negative for fever. Cardiovascular: Positive for chest tightness. Respiratory: Negative for shortness of breath. Gastrointestinal: Negative for abdominal pain Musculoskeletal: Negative for back pain. Neurological: Negative for headache 10-point ROS otherwise negative.  ____________________________________________   PHYSICAL EXAM:  VITAL SIGNS: ED Triage Vitals  Enc Vitals Group     BP 05/11/16 1446 169/83 mmHg     Pulse Rate 05/11/16 1446 110     Resp 05/11/16 1446 16     Temp 05/11/16 1446 97.9 F (36.6 C)     Temp Source 05/11/16 1446 Oral     SpO2 05/11/16 1437 98 %     Weight 05/11/16 1446 211 lb 12.8 oz (96.072 kg)     Height 05/11/16 1446 5\' 5"  (1.651 m)     Head Cir --      Peak Flow --      Pain Score 05/11/16 1444 2     Pain Loc --      Pain Edu? --      Excl. in [place]? --     Constitutional: Alert and oriented. Well appearing and in no distress. Eyes: Normal exam ENT   Head: Normocephalic and atraumatic   Mouth/Throat: Mucous membranes are moist. Cardiovascular: Normal rate, regular rhythm. No murmur Respiratory: Normal respiratory effort without tachypnea nor retractions. Breath sounds are clear and chest is nontender to palpation. Gastrointestinal: Soft and nontender. No distention.  Peritoneal dialysis catheter present. Musculoskeletal: Nontender with normal range of motion in all extremities. Neurologic:  Normal speech and language. No gross focal neurologic deficits Skin:  Skin is warm, dry and intact.  Psychiatric: Mood and affect are  normal.  ____________________________________________    EKG  EKG reviewed and interpreted by myself shows sinus tachycardia 105 bpm, narrow QRS, normal axis, normal intervals, nonspecific but no concerning ST changes.  ____________________________________________    RADIOLOGY  Chest x-ray negative   INITIAL IMPRESSION / ASSESSMENT AND PLAN / ED COURSE  Pertinent labs & imaging results that were available during my care of the patient were reviewed by me and considered in my medical decision making (see chart for details).  Patient presents the emergency department for chest tightness and shortness of breath beginning around 1 PM, resolved by the time she arrived to the emergency department. Denies any complaints at this time. We will check labs, chest x-ray, and closely monitor the patient on  cardiac monitoring.  The patient's labs have resulted with a slight troponin elevation 0.05. Patient does have a GFR of 2, states the chest pain is gone but she is now having some epigastric burning. We'll dose of GI cocktail and monitor for symptom relief. We will repeat a troponin 3 hours after the initial troponin was drawn. Patient is asking for food stating that she is hungry.  Repeat troponin is unchanged. Patient states she is feeling well. We'll discharge home with PCP and cardiology follow-up. Patient is agreeable to plan. I discussed return precautions for return of any chest pain, trouble breathing.  ____________________________________________   FINAL CLINICAL IMPRESSION(S) / ED DIAGNOSES  Chest pain   Harvest Dark, MD 05/11/16 Clarissa  Harvest Dark, MD 05/11/16 2013

## 2016-05-11 NOTE — Discharge Instructions (Signed)
You have been seen in the emergency department today for chest pain. Your workup has shown normal results. As we discussed please follow-up with your primary care physician in the next 1-2 days for recheck. Return to the emergency department for any further chest pain, trouble breathing, or any other symptom personally concerning to yourself. ° °Nonspecific Chest Pain °It is often hard to find the cause of chest pain. There is always a chance that your pain could be related to something serious, such as a heart attack or a blood clot in your lungs. Chest pain can also be caused by conditions that are not life-threatening. If you have chest pain, it is very important to follow up with your doctor. ° °HOME CARE °· If you were prescribed an antibiotic medicine, finish it all even if you start to feel better. °· Avoid any activities that cause chest pain. °· Do not use any tobacco products, including cigarettes, chewing tobacco, or electronic cigarettes. If you need help quitting, ask your doctor. °· Do not drink alcohol. °· Take medicines only as told by your doctor. °· Keep all follow-up visits as told by your doctor. This is important. This includes any further testing if your chest pain does not go away. °· Your doctor may tell you to keep your head raised (elevated) while you sleep. °· Make lifestyle changes as told by your doctor. These may include: °¨ Getting regular exercise. Ask your doctor to suggest some activities that are safe for you. °¨ Eating a heart-healthy diet. Your doctor or a diet specialist (dietitian) can help you to learn healthy eating options. °¨ Maintaining a healthy weight. °¨ Managing diabetes, if necessary. °¨ Reducing stress. °GET HELP IF: °· Your chest pain does not go away, even after treatment. °· You have a rash with blisters on your chest. °· You have a fever. °GET HELP RIGHT AWAY IF: °· Your chest pain is worse. °· You have an increasing cough, or you cough up blood. °· You have  severe belly (abdominal) pain. °· You feel extremely weak. °· You pass out (faint). °· You have chills. °· You have sudden, unexplained chest discomfort. °· You have sudden, unexplained discomfort in your arms, back, neck, or jaw. °· You have shortness of breath at any time. °· You suddenly start to sweat, or your skin gets clammy. °· You feel nauseous. °· You vomit. °· You suddenly feel light-headed or dizzy. °· Your heart begins to beat quickly, or it feels like it is skipping beats. °These symptoms may be an emergency. Do not wait to see if the symptoms will go away. Get medical help right away. Call your local emergency services (911 in the [place] Do not drive yourself to the hospital. °  °This information is not intended to replace advice given to you by your health care provider. Make sure you discuss any questions you have with your health care provider. °  °Document Released: 04/09/2008 Document Revised: 11/12/2014 Document Reviewed: 05/28/2014 °Elsevier Interactive Patient Education ©2016 [employer] ° °

## 2016-05-11 NOTE — ED Notes (Signed)
Lab called with elevated troponin of 0.05 - reported to [facility]

## 2016-05-15 ENCOUNTER — Encounter
Admission: RE | Admit: 2016-05-15 | Discharge: 2016-05-15 | Disposition: A | Discharge status: Home / Self Care | Payer: Medicare Other | Source: Ambulatory Visit | Attending: Vascular Surgery | Admitting: Vascular Surgery

## 2016-05-15 DIAGNOSIS — Z01812 Encounter for preprocedural laboratory examination: Secondary | ICD-10-CM | POA: Diagnosis present

## 2016-05-15 HISTORY — DX: Low back pain, unspecified: M54.50

## 2016-05-15 HISTORY — DX: Low back pain: M54.5

## 2016-05-15 LAB — CBC WITH DIFFERENTIAL/PLATELET
BASOS ABS: 0 10*3/uL (ref 0–0.1)
BASOS PCT: 0 %
EOS PCT: 1 %
Eosinophils Absolute: 0.1 10*3/uL (ref 0–0.7)
HCT: 21.9 % — ABNORMAL LOW (ref 35.0–47.0)
Hemoglobin: 7.5 g/dL — ABNORMAL LOW (ref 12.0–16.0)
Lymphocytes Relative: 28 %
Lymphs Abs: 3.1 10*3/uL (ref 1.0–3.6)
MCH: 35.5 pg — ABNORMAL HIGH (ref 26.0–34.0)
MCHC: 34.3 g/dL (ref 32.0–36.0)
MCV: 103.7 fL — ABNORMAL HIGH (ref 80.0–100.0)
MONO ABS: 0.9 10*3/uL (ref 0.2–0.9)
Monocytes Relative: 9 %
Neutro Abs: 6.8 10*3/uL — ABNORMAL HIGH (ref 1.4–6.5)
Neutrophils Relative %: 62 %
PLATELETS: 189 10*3/uL (ref 150–440)
RBC: 2.11 MIL/uL — ABNORMAL LOW (ref 3.80–5.20)
RDW: 16.1 % — AB (ref 11.5–14.5)
WBC: 11 10*3/uL (ref 3.6–11.0)

## 2016-05-15 LAB — APTT: APTT: 24 s (ref 24–36)

## 2016-05-15 LAB — BASIC METABOLIC PANEL
Anion gap: 19 — ABNORMAL HIGH (ref 5–15)
BUN: 106 mg/dL — AB (ref 6–20)
CHLORIDE: 95 mmol/L — AB (ref 101–111)
CO2: 22 mmol/L (ref 22–32)
CREATININE: 13.92 mg/dL — AB (ref 0.44–1.00)
Calcium: 7.7 mg/dL — ABNORMAL LOW (ref 8.9–10.3)
GFR calc Af Amer: 3 mL/min — ABNORMAL LOW (ref >60)
GFR, EST NON AFRICAN AMERICAN: 2 mL/min — AB (ref >60)
GLUCOSE: 97 mg/dL (ref 65–99)
Potassium: 4 mmol/L (ref 3.5–5.1)
SODIUM: 136 mmol/L (ref 135–145)

## 2016-05-15 LAB — SURGICAL PCR SCREEN
MRSA, PCR: NEGATIVE
Staphylococcus aureus: NEGATIVE

## 2016-05-15 LAB — PROTIME-INR
INR: 0.91
Prothrombin Time: 12.5 seconds (ref 11.4–15.0)

## 2016-05-15 MED ORDER — CHLORHEXIDINE GLUCONATE CLOTH 2 % EX PADS
6.0000 | MEDICATED_PAD | Freq: Once | CUTANEOUS | Status: DC
  Filled 2016-05-15: qty 6

## 2016-05-15 NOTE — Pre-Procedure Instructions (Signed)
Called and faxed labs to Dr Delana Meyer.   Office notified regarding BP readings.

## 2016-05-15 NOTE — Patient Instructions (Addendum)
  Your procedure is scheduled on: 05/25/16 Fri Report to Same Day Surgery 2nd floor medical mall To find out your arrival time please call [phone] between 1PM - 3PM on 05/24/16 Thurs  Remember: Instructions that are not followed completely may result in serious medical risk, up to and including death, or upon the discretion of your surgeon and anesthesiologist your surgery may need to be rescheduled.    _x___ 1. Do not eat food or drink liquids after midnight. No gum chewing or hard candies.     ____ 2. No Alcohol for 24 hours before or after surgery.   ____3. No Smoking for 24 prior to surgery.   ____  4. Bring all medications with you on the day of surgery if instructed.    __x__ 5. Notify your doctor if there is any change in your medical condition     (cold, fever, infections).     Do not wear jewelry, make-up, hairpins, clips or nail polish.  Do not wear lotions, powders, or perfumes. You may wear deodorant.  Do not shave 48 hours prior to surgery. Men may shave face and neck.  Do not bring valuables to the hospital.    [facility] is not responsible for any belongings or valuables.               Contacts, dentures or bridgework may not be worn into surgery.  Leave your suitcase in the car. After surgery it may be brought to your room.  For patients admitted to the hospital, discharge time is determined by your treatment team.   Patients discharged the day of surgery will not be allowed to drive home.    Please read over the following fact sheets that you were given:   [facility] Preparing for Surgery and or MRSA Information   _x___ Take these medicines the morning of surgery with A SIP OF WATER:    1. hydrALAZINE (APRESOLINE) 50 MG tablet  2.atenolol (TENORMIN) 25 MG tablet  3.gabapentin (NEURONTIN) 100 MG capsule  4.losartan (COZAAR) 100 MG tablet  5.  6.  ____ Fleet Enema (as directed)   _x___ Use CHG Soap or sage wipes as directed on instruction sheet    ____ Use inhalers on the day of surgery and bring to hospital day of surgery  ____ Stop metformin 2 days prior to surgery    _x___ Take 1/2 of usual insulin dose the night before surgery and none on the morning of           surgery.   ____ Stop aspirin or coumadin, or plavix  _x__ Stop Anti-inflammatories such as Advil, Aleve, Ibuprofen, Motrin, Naproxen,          Naprosyn, Goodies powders or aspirin products. Ok to take Tylenol.   ____ Stop supplements until after surgery.    ____ Bring C-Pap to the hospital.

## 2016-05-21 ENCOUNTER — Emergency Department: Payer: Medicare Other

## 2016-05-21 ENCOUNTER — Inpatient Hospital Stay
Admission: EM | Admit: 2016-05-21 | Discharge: 2016-05-28 | DRG: 871 | Disposition: A | Discharge status: Home Health | Payer: Medicare Other | Attending: Internal Medicine | Admitting: Internal Medicine

## 2016-05-21 ENCOUNTER — Encounter: Payer: Self-pay | Admitting: Intensive Care

## 2016-05-21 DIAGNOSIS — Z7982 Long term (current) use of aspirin: Secondary | ICD-10-CM

## 2016-05-21 DIAGNOSIS — Z794 Long term (current) use of insulin: Secondary | ICD-10-CM | POA: Diagnosis not present

## 2016-05-21 DIAGNOSIS — T68XXXA Hypothermia, initial encounter: Secondary | ICD-10-CM

## 2016-05-21 DIAGNOSIS — R680 Hypothermia, not associated with low environmental temperature: Secondary | ICD-10-CM | POA: Diagnosis present

## 2016-05-21 DIAGNOSIS — I120 Hypertensive chronic kidney disease with stage 5 chronic kidney disease or end stage renal disease: Secondary | ICD-10-CM | POA: Diagnosis present

## 2016-05-21 DIAGNOSIS — R609 Edema, unspecified: Secondary | ICD-10-CM

## 2016-05-21 DIAGNOSIS — N186 End stage renal disease: Secondary | ICD-10-CM | POA: Diagnosis present

## 2016-05-21 DIAGNOSIS — N2581 Secondary hyperparathyroidism of renal origin: Secondary | ICD-10-CM | POA: Diagnosis present

## 2016-05-21 DIAGNOSIS — Z87891 Personal history of nicotine dependence: Secondary | ICD-10-CM | POA: Diagnosis not present

## 2016-05-21 DIAGNOSIS — D631 Anemia in chronic kidney disease: Secondary | ICD-10-CM | POA: Diagnosis present

## 2016-05-21 DIAGNOSIS — E11649 Type 2 diabetes mellitus with hypoglycemia without coma: Secondary | ICD-10-CM | POA: Diagnosis present

## 2016-05-21 DIAGNOSIS — M109 Gout, unspecified: Secondary | ICD-10-CM | POA: Diagnosis present

## 2016-05-21 DIAGNOSIS — H409 Unspecified glaucoma: Secondary | ICD-10-CM | POA: Diagnosis present

## 2016-05-21 DIAGNOSIS — E1140 Type 2 diabetes mellitus with diabetic neuropathy, unspecified: Secondary | ICD-10-CM | POA: Diagnosis present

## 2016-05-21 DIAGNOSIS — A401 Sepsis due to streptococcus, group B: Principal | ICD-10-CM | POA: Diagnosis present

## 2016-05-21 DIAGNOSIS — E1165 Type 2 diabetes mellitus with hyperglycemia: Secondary | ICD-10-CM | POA: Diagnosis not present

## 2016-05-21 DIAGNOSIS — Z79899 Other long term (current) drug therapy: Secondary | ICD-10-CM | POA: Diagnosis not present

## 2016-05-21 DIAGNOSIS — R531 Weakness: Secondary | ICD-10-CM | POA: Diagnosis present

## 2016-05-21 DIAGNOSIS — E1122 Type 2 diabetes mellitus with diabetic chronic kidney disease: Secondary | ICD-10-CM | POA: Diagnosis present

## 2016-05-21 DIAGNOSIS — E785 Hyperlipidemia, unspecified: Secondary | ICD-10-CM | POA: Diagnosis present

## 2016-05-21 DIAGNOSIS — G9341 Metabolic encephalopathy: Secondary | ICD-10-CM | POA: Diagnosis present

## 2016-05-21 DIAGNOSIS — E11319 Type 2 diabetes mellitus with unspecified diabetic retinopathy without macular edema: Secondary | ICD-10-CM | POA: Diagnosis present

## 2016-05-21 DIAGNOSIS — K219 Gastro-esophageal reflux disease without esophagitis: Secondary | ICD-10-CM | POA: Diagnosis present

## 2016-05-21 DIAGNOSIS — Z992 Dependence on renal dialysis: Secondary | ICD-10-CM | POA: Diagnosis not present

## 2016-05-21 DIAGNOSIS — E162 Hypoglycemia, unspecified: Secondary | ICD-10-CM | POA: Diagnosis present

## 2016-05-21 LAB — COMPREHENSIVE METABOLIC PANEL
ALT: 72 U/L — AB (ref 14–54)
ANION GAP: 18 — AB (ref 5–15)
AST: 38 U/L (ref 15–41)
Albumin: 3.3 g/dL — ABNORMAL LOW (ref 3.5–5.0)
Alkaline Phosphatase: 92 U/L (ref 38–126)
BUN: 87 mg/dL — AB (ref 6–20)
CALCIUM: 7.8 mg/dL — AB (ref 8.9–10.3)
CO2: 23 mmol/L (ref 22–32)
Chloride: 97 mmol/L — ABNORMAL LOW (ref 101–111)
Creatinine, Ser: 13.3 mg/dL — ABNORMAL HIGH (ref 0.44–1.00)
GFR calc non Af Amer: 2 mL/min — ABNORMAL LOW (ref >60)
GFR, EST AFRICAN AMERICAN: 3 mL/min — AB (ref >60)
GLUCOSE: 105 mg/dL — AB (ref 65–99)
POTASSIUM: 3.7 mmol/L (ref 3.5–5.1)
SODIUM: 138 mmol/L (ref 135–145)
TOTAL PROTEIN: 6.5 g/dL (ref 6.5–8.1)
Total Bilirubin: 0.4 mg/dL (ref 0.3–1.2)

## 2016-05-21 LAB — GLUCOSE, CAPILLARY
GLUCOSE-CAPILLARY: 105 mg/dL — AB (ref 65–99)
Glucose-Capillary: 72 mg/dL (ref 65–99)
Glucose-Capillary: 121 mg/dL — ABNORMAL HIGH (ref 65–99)
Glucose-Capillary: 130 mg/dL — ABNORMAL HIGH (ref 65–99)
Glucose-Capillary: 108 mg/dL — ABNORMAL HIGH (ref 65–99)

## 2016-05-21 LAB — URINALYSIS COMPLETE WITH MICROSCOPIC (ARMC ONLY)
BACTERIA UA: NONE SEEN
BILIRUBIN URINE: NEGATIVE
Glucose, UA: NEGATIVE mg/dL
Ketones, ur: NEGATIVE mg/dL
Nitrite: NEGATIVE
PH: 5 (ref 5.0–8.0)
PROTEIN: 30 mg/dL — AB
Specific Gravity, Urine: 1.013 (ref 1.005–1.030)

## 2016-05-21 LAB — TROPONIN I: TROPONIN I: 0.06 ng/mL — AB (ref <0.03)

## 2016-05-21 LAB — CBC WITH DIFFERENTIAL/PLATELET
BASOS ABS: 0 10*3/uL (ref 0–0.1)
Basophils Relative: 0 %
EOS ABS: 0.1 10*3/uL (ref 0–0.7)
EOS PCT: 1 %
HCT: 21.8 % — ABNORMAL LOW (ref 35.0–47.0)
Hemoglobin: 7.3 g/dL — ABNORMAL LOW (ref 12.0–16.0)
Lymphocytes Relative: 8 %
Lymphs Abs: 1 10*3/uL (ref 1.0–3.6)
MCH: 34.9 pg — AB (ref 26.0–34.0)
MCHC: 33.4 g/dL (ref 32.0–36.0)
MCV: 104.3 fL — ABNORMAL HIGH (ref 80.0–100.0)
Monocytes Absolute: 0.8 10*3/uL (ref 0.2–0.9)
Monocytes Relative: 6 %
Neutro Abs: 11 10*3/uL — ABNORMAL HIGH (ref 1.4–6.5)
Neutrophils Relative %: 85 %
PLATELETS: 201 10*3/uL (ref 150–440)
RBC: 2.09 MIL/uL — AB (ref 3.80–5.20)
RDW: 16.7 % — ABNORMAL HIGH (ref 11.5–14.5)
WBC: 12.9 10*3/uL — AB (ref 3.6–11.0)

## 2016-05-21 LAB — TSH: TSH: 1.587 u[IU]/mL (ref 0.350–4.500)

## 2016-05-21 LAB — T4, FREE: FREE T4: 0.82 ng/dL (ref 0.61–1.12)

## 2016-05-21 LAB — LACTIC ACID, PLASMA: Lactic Acid, Venous: 1.6 mmol/L (ref 0.5–1.9)

## 2016-05-21 MED ORDER — AMITRIPTYLINE HCL 25 MG PO TABS: 25.0000 mg | ORAL_TABLET | Freq: Every evening | ORAL | Status: DC | PRN

## 2016-05-21 MED ORDER — ALPRAZOLAM 0.25 MG PO TABS
0.2500 mg | ORAL_TABLET | Freq: Every evening | ORAL | Status: DC | PRN
  Administered 2016-05-22 (×2): 0.25 mg via ORAL
  Filled 2016-05-21 (×2): qty 1

## 2016-05-21 MED ORDER — CLONIDINE HCL 0.1 MG PO TABS
0.3000 mg | ORAL_TABLET | Freq: Two times a day (BID) | ORAL | Status: DC
  Filled 2016-05-21: qty 3

## 2016-05-21 MED ORDER — ISOSORBIDE MONONITRATE ER 30 MG PO TB24
30.0000 mg | ORAL_TABLET | Freq: Two times a day (BID) | ORAL | Status: DC
  Administered 2016-05-22 – 2016-05-28 (×12): 30 mg via ORAL
  Filled 2016-05-21 (×13): qty 1

## 2016-05-21 MED ORDER — ASPIRIN EC 325 MG PO TBEC
325.0000 mg | DELAYED_RELEASE_TABLET | Freq: Every day | ORAL | Status: DC
  Administered 2016-05-21 – 2016-05-28 (×8): 325 mg via ORAL
  Filled 2016-05-21 (×8): qty 1

## 2016-05-21 MED ORDER — CINACALCET HCL 30 MG PO TABS
90.0000 mg | ORAL_TABLET | Freq: Every day | ORAL | Status: DC
  Administered 2016-05-23 – 2016-05-28 (×6): 90 mg via ORAL
  Filled 2016-05-21 (×6): qty 3

## 2016-05-21 MED ORDER — HEPARIN 1000 UNIT/ML FOR PERITONEAL DIALYSIS
3000.0000 [IU] | INTRAMUSCULAR | Status: DC | PRN
  Filled 2016-05-21: qty 3

## 2016-05-21 MED ORDER — PIPERACILLIN-TAZOBACTAM 3.375 G IVPB
3.3750 g | Freq: Once | INTRAVENOUS | Status: AC
  Administered 2016-05-21: 3.375 g via INTRAVENOUS
  Filled 2016-05-21: qty 50

## 2016-05-21 MED ORDER — LOSARTAN POTASSIUM 50 MG PO TABS
100.0000 mg | ORAL_TABLET | Freq: Every day | ORAL | Status: DC
  Administered 2016-05-21 – 2016-05-28 (×7): 100 mg via ORAL
  Filled 2016-05-21 (×7): qty 2

## 2016-05-21 MED ORDER — HEPARIN SODIUM (PORCINE) 5000 UNIT/ML IJ SOLN
5000.0000 [IU] | Freq: Three times a day (TID) | INTRAMUSCULAR | Status: DC
  Administered 2016-05-21 – 2016-05-28 (×20): 5000 [IU] via SUBCUTANEOUS
  Filled 2016-05-21 (×20): qty 1

## 2016-05-21 MED ORDER — AMLODIPINE BESYLATE 10 MG PO TABS
10.0000 mg | ORAL_TABLET | Freq: Every day | ORAL | Status: DC
  Administered 2016-05-21 – 2016-05-22 (×2): 10 mg via ORAL
  Filled 2016-05-21 (×2): qty 1

## 2016-05-21 MED ORDER — HYDRALAZINE HCL 50 MG PO TABS
50.0000 mg | ORAL_TABLET | Freq: Three times a day (TID) | ORAL | Status: DC
  Administered 2016-05-21 – 2016-05-22 (×2): 50 mg via ORAL
  Filled 2016-05-21 (×5): qty 1

## 2016-05-21 MED ORDER — PREDNISONE 20 MG PO TABS
20.0000 mg | ORAL_TABLET | Freq: Every day | ORAL | Status: DC
  Administered 2016-05-23 – 2016-05-26 (×2): 20 mg via ORAL
  Filled 2016-05-21: qty 1

## 2016-05-21 MED ORDER — SEVELAMER CARBONATE 800 MG PO TABS
2400.0000 mg | ORAL_TABLET | Freq: Three times a day (TID) | ORAL | Status: DC
  Administered 2016-05-21 – 2016-05-28 (×18): 2400 mg via ORAL
  Filled 2016-05-21 (×18): qty 3

## 2016-05-21 MED ORDER — ONDANSETRON HCL 4 MG PO TABS: 4.0000 mg | ORAL_TABLET | Freq: Four times a day (QID) | ORAL | Status: DC | PRN

## 2016-05-21 MED ORDER — PRAVASTATIN SODIUM 40 MG PO TABS
80.0000 mg | ORAL_TABLET | Freq: Every day | ORAL | Status: DC
  Administered 2016-05-21 – 2016-05-27 (×7): 80 mg via ORAL
  Filled 2016-05-21 (×7): qty 2

## 2016-05-21 MED ORDER — LATANOPROST 0.005 % OP SOLN
1.0000 [drp] | Freq: Every day | OPHTHALMIC | Status: DC
  Administered 2016-05-21 – 2016-05-27 (×7): 1 [drp] via OPHTHALMIC
  Filled 2016-05-21: qty 2.5

## 2016-05-21 MED ORDER — GENTAMICIN SULFATE 0.1 % EX CREA
1.0000 "application " | TOPICAL_CREAM | Freq: Every day | CUTANEOUS | Status: DC
  Administered 2016-05-21 – 2016-05-22 (×2): 1 via TOPICAL
  Filled 2016-05-21: qty 15

## 2016-05-21 MED ORDER — INSULIN ASPART 100 UNIT/ML ~~LOC~~ SOLN
0.0000 [IU] | Freq: Three times a day (TID) | SUBCUTANEOUS | Status: DC
  Administered 2016-05-21: 1 [IU] via SUBCUTANEOUS
  Administered 2016-05-22: 2 [IU] via SUBCUTANEOUS
  Administered 2016-05-22: 9 [IU] via SUBCUTANEOUS
  Administered 2016-05-23: 2 [IU] via SUBCUTANEOUS
  Administered 2016-05-23: 5 [IU] via SUBCUTANEOUS
  Administered 2016-05-23: 2 [IU] via SUBCUTANEOUS
  Administered 2016-05-24 (×2): 3 [IU] via SUBCUTANEOUS
  Administered 2016-05-24 – 2016-05-25 (×2): 2 [IU] via SUBCUTANEOUS
  Administered 2016-05-25 (×2): 1 [IU] via SUBCUTANEOUS
  Administered 2016-05-26: 2 [IU] via SUBCUTANEOUS
  Administered 2016-05-26 – 2016-05-27 (×3): 3 [IU] via SUBCUTANEOUS
  Administered 2016-05-27: 1 [IU] via SUBCUTANEOUS
  Administered 2016-05-28: 3 [IU] via SUBCUTANEOUS
  Administered 2016-05-28: 1 [IU] via SUBCUTANEOUS
  Filled 2016-05-21 (×2): qty 1
  Filled 2016-05-21: qty 5
  Filled 2016-05-21: qty 1
  Filled 2016-05-21: qty 2
  Filled 2016-05-21: qty 1
  Filled 2016-05-21 (×2): qty 2
  Filled 2016-05-21: qty 3
  Filled 2016-05-21: qty 2
  Filled 2016-05-21: qty 1
  Filled 2016-05-21: qty 2
  Filled 2016-05-21 (×3): qty 3
  Filled 2016-05-21: qty 2
  Filled 2016-05-21: qty 9
  Filled 2016-05-21 (×2): qty 3

## 2016-05-21 MED ORDER — CLONIDINE HCL 0.1 MG PO TABS
0.1000 mg | ORAL_TABLET | Freq: Once | ORAL | Status: AC
  Administered 2016-05-21: 0.1 mg via ORAL

## 2016-05-21 MED ORDER — PREDNISONE 20 MG PO TABS
40.0000 mg | ORAL_TABLET | Freq: Every day | ORAL | Status: DC
  Administered 2016-05-21 – 2016-05-22 (×2): 40 mg via ORAL
  Filled 2016-05-21 (×2): qty 2

## 2016-05-21 MED ORDER — GABAPENTIN 100 MG PO CAPS
100.0000 mg | ORAL_CAPSULE | Freq: Three times a day (TID) | ORAL | Status: DC
  Administered 2016-05-21 – 2016-05-28 (×21): 100 mg via ORAL
  Filled 2016-05-21 (×21): qty 1

## 2016-05-21 MED ORDER — ONDANSETRON HCL 4 MG/2ML IJ SOLN: 4.0000 mg | Freq: Four times a day (QID) | INTRAMUSCULAR | Status: DC | PRN

## 2016-05-21 MED ORDER — PREDNISONE 10 MG PO TABS
10.0000 mg | ORAL_TABLET | Freq: Every day | ORAL | Status: DC
Start: 2016-05-24 — End: 2016-05-25
  Administered 2016-05-24: 10 mg via ORAL
  Filled 2016-05-21: qty 1

## 2016-05-21 MED ORDER — FLUTICASONE PROPIONATE 50 MCG/ACT NA SUSP
2.0000 | Freq: Every day | NASAL | Status: DC | PRN
  Filled 2016-05-21: qty 16

## 2016-05-21 MED ORDER — INSULIN ASPART 100 UNIT/ML ~~LOC~~ SOLN
0.0000 [IU] | Freq: Every day | SUBCUTANEOUS | Status: DC
Start: 2016-05-21 — End: 2016-05-28
  Administered 2016-05-22 – 2016-05-23 (×2): 3 [IU] via SUBCUTANEOUS
  Administered 2016-05-25 – 2016-05-26 (×2): 2 [IU] via SUBCUTANEOUS
  Filled 2016-05-21: qty 3
  Filled 2016-05-21: qty 2
  Filled 2016-05-21: qty 3
  Filled 2016-05-21: qty 2

## 2016-05-21 MED ORDER — ACETAMINOPHEN 325 MG PO TABS
650.0000 mg | ORAL_TABLET | Freq: Four times a day (QID) | ORAL | Status: DC | PRN
  Administered 2016-05-21 – 2016-05-26 (×6): 650 mg via ORAL
  Filled 2016-05-21 (×6): qty 2

## 2016-05-21 MED ORDER — METOCLOPRAMIDE HCL 10 MG PO TABS: 10.0000 mg | ORAL_TABLET | Freq: Three times a day (TID) | ORAL | Status: DC | PRN

## 2016-05-21 MED ORDER — CLONIDINE HCL 0.1 MG PO TABS
0.3000 mg | ORAL_TABLET | Freq: Two times a day (BID) | ORAL | Status: DC
  Administered 2016-05-22: 0.3 mg via ORAL
  Filled 2016-05-21 (×2): qty 3

## 2016-05-21 MED ORDER — PREDNISONE 20 MG PO TABS
30.0000 mg | ORAL_TABLET | Freq: Every day | ORAL | Status: DC
  Administered 2016-05-22: 30 mg via ORAL
  Filled 2016-05-21 (×3): qty 1

## 2016-05-21 MED ORDER — ALLOPURINOL 100 MG PO TABS
100.0000 mg | ORAL_TABLET | Freq: Every day | ORAL | Status: DC
  Administered 2016-05-21 – 2016-05-28 (×8): 100 mg via ORAL
  Filled 2016-05-21 (×8): qty 1

## 2016-05-21 MED ORDER — CYCLOBENZAPRINE HCL 10 MG PO TABS
5.0000 mg | ORAL_TABLET | Freq: Three times a day (TID) | ORAL | Status: DC | PRN
  Administered 2016-05-22 – 2016-05-26 (×2): 5 mg via ORAL
  Filled 2016-05-21 (×2): qty 1

## 2016-05-21 MED ORDER — ACETAMINOPHEN 650 MG RE SUPP: 650.0000 mg | Freq: Four times a day (QID) | RECTAL | Status: DC | PRN

## 2016-05-21 MED ORDER — VANCOMYCIN HCL IN DEXTROSE 1-5 GM/200ML-% IV SOLN
1000.0000 mg | Freq: Once | INTRAVENOUS | Status: AC
  Administered 2016-05-21: 1000 mg via INTRAVENOUS
  Filled 2016-05-21: qty 200

## 2016-05-21 MED ORDER — ATENOLOL 25 MG PO TABS
25.0000 mg | ORAL_TABLET | Freq: Every day | ORAL | Status: DC
  Administered 2016-05-21 – 2016-05-27 (×5): 25 mg via ORAL
  Filled 2016-05-21 (×7): qty 1

## 2016-05-21 MED ORDER — DELFLEX-LC/1.5% DEXTROSE 346 MOSM/L IP SOLN
INTRAPERITONEAL | Status: DC
  Administered 2016-05-21: 23:00:00 via INTRAPERITONEAL
  Filled 2016-05-21 (×2): qty 3000

## 2016-05-21 MED ORDER — HYDROCODONE-ACETAMINOPHEN 5-325 MG PO TABS
1.0000 | ORAL_TABLET | Freq: Two times a day (BID) | ORAL | Status: DC | PRN
  Administered 2016-05-21 – 2016-05-28 (×4): 1 via ORAL
  Filled 2016-05-21 (×4): qty 1

## 2016-05-21 NOTE — ED Notes (Signed)
Bair hugger applied.

## 2016-05-21 NOTE — ED Provider Notes (Signed)
[facility] Emergency Department Provider Note   ____________________________________________  Time seen: Approximately 11:53 AM  I have reviewed the triage vital signs and the nursing notes.   HISTORY  Chief Complaint Hypoglycemia    HPI Laurie Steele is a 75 y.o. female with end-stage renal disease on nightly peritoneal dialysis, diabetes, GERD, hypertension and hyperlipidemia who presents for evaluation of confusion, severe generalized weakness as well as hypoglycemia today, gradual onset, severe, improved with D50. Family reports that today the patient was confused, she was generally weak unable to put on her clothing. They called 911 and on EMS arrival her blood sugar was 31 which improved to 210 with an amp of D50. Patient denies any chest pain, difficulty breathing, vomiting, diarrhea, fevers or chills. She did not take any medicines for diabetes this morning but she also has not eaten. She was recently treated for gout but has otherwise been in her usual state of health.   Past Medical History  Diagnosis Date  . Hx of colonic polyps   . Hyperlipidemia   . Hypertension   . Renal failure     Dr Holley Raring  . 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 Chronic anemia     Dr Inez Pilgrim  . Glaucoma   . GERD (gastroesophageal reflux disease)   . Gout   . Miscarriage   . Vaginal delivery     x 4  . Peritoneal dialysis status ([place])   . Lower back pain     Patient Active Problem List   Diagnosis Date Noted  . Chest pain 04/05/2016  . Hypercalcemia 03/14/2016  . Bronchitis 03/03/2012  . Type II or unspecified type diabetes mellitus with renal manifestations, not stated as uncontrolled 12/18/2011  . END STAGE RENAL DISEASE 10/23/2010  . NEUROPATHY 12/13/2009  . GOUT 02/15/2009  . SLEEP  DISORDER 02/15/2009  . GERD 02/23/2008  . HYPERLIPIDEMIA 05/14/2007  . ANEMIA, CHRONIC 05/14/2007  . HYPERTENSION 05/14/2007    Past Surgical History  Procedure Laterality Date  . Cholecystectomy    . Abdominal hysterectomy  1983  . Back surgery  6/08    Dr Collier Salina  . Back surgery  1987    disc  . Peritoneal catheter insertion      Current Outpatient Rx  Name  Route  Sig  Dispense  Refill  . allopurinol (ZYLOPRIM) 100 MG tablet   Oral   Take 1 tablet by mouth daily.          Marland Kitchen ALPRAZolam (XANAX) 0.25 MG tablet   Oral   Take 1 tablet by mouth at bedtime as needed for sleep.          Marland Kitchen amitriptyline (ELAVIL) 25 MG tablet   Oral   Take 1 tablet by mouth at bedtime as needed (For foot pain).       3   . amLODipine (NORVASC) 10 MG tablet   Oral   Take 10 mg by mouth daily. Take it mid-day      11   . aspirin EC 325 MG tablet   Oral   Take 325 mg by mouth daily.         Marland Kitchen atenolol (TENORMIN) 25 MG tablet   Oral   Take 1 tablet by mouth daily.         Marland Kitchen  B Complex-C-Folic Acid (DIALYVITE Q000111Q PO)   Oral   Take 1 tablet by mouth daily.         . cinacalcet (SENSIPAR) 90 MG tablet   Oral   Take 90 mg by mouth daily. With lunch         . cloNIDine (CATAPRES) 0.3 MG tablet   Oral   Take 0.3 mg by mouth 2 (two) times daily.         . cyclobenzaprine (FLEXERIL) 5 MG tablet   Oral   Take 1 tablet by mouth 3 (three) times daily as needed for muscle spasms.          . fluticasone (FLONASE) 50 MCG/ACT nasal spray   Each Nare   Place 2 sprays into both nostrils daily as needed for allergies.          Marland Kitchen gabapentin (NEURONTIN) 100 MG capsule   Oral   Take 1 capsule by mouth 3 (three) times daily.         Marland Kitchen HUMALOG 100 UNIT/ML injection   Subcutaneous   Inject 20-48 Units into the skin 2 (two) times daily. Pt. Takes 20 units before breakfast and 48 units before supper.      1     Dispense as written.   . hydrALAZINE (APRESOLINE) 50 MG  tablet   Oral   Take 50 mg by mouth 3 (three) times daily.           Marland Kitchen HYDROcodone-acetaminophen (NORCO/VICODIN) 5-325 MG tablet   Oral   Take 1 tablet by mouth 2 (two) times daily as needed for moderate pain.          Marland Kitchen insulin glargine (LANTUS) 100 UNIT/ML injection   Subcutaneous   Inject 0.8 mLs (80 Units total) into the skin at bedtime. Patient taking differently: Inject 70 Units into the skin at bedtime.    7 vial   3   . ipratropium (ATROVENT) 0.03 % nasal spray   Each Nare   Place 1 spray into both nostrils 3 (three) times daily as needed for rhinitis.         Marland Kitchen isosorbide mononitrate (IMDUR) 30 MG 24 hr tablet   Oral   Take 30 mg by mouth 2 (two) times daily.         Marland Kitchen latanoprost (XALATAN) 0.005 % ophthalmic solution   Both Eyes   Place 1 drop into both eyes at bedtime.      6   . losartan (COZAAR) 100 MG tablet   Oral   Take 100 mg by mouth daily.      11   . metoCLOPramide (REGLAN) 10 MG tablet   Oral   Take 10 mg by mouth 3 (three) times daily as needed for nausea.          . pravastatin (PRAVACHOL) 80 MG tablet   Oral   Take 1 tablet (80 mg total) by mouth daily. Patient taking differently: Take 80 mg by mouth at bedtime.    90 tablet   3   . predniSONE (DELTASONE) 10 MG tablet   Oral   Take 10-60 mg by mouth daily. 60 mg daily on day 1, then 50 mg daily on day 2, then 40 mg daily on day 3, then 30 mg daily on day 4, then 20 mg daily on day 5, then 10 mg daily on day 6, then stop         . sevelamer (RENVELA) 800 MG tablet   Oral  Take 2,400 mg by mouth 3 (three) times daily with meals.            Allergies Review of patient's allergies indicates no known allergies.  Family History  Problem Relation Age of Onset  . Heart attack Father   . Hypertension Mother   . Hyperlipidemia Mother   . Coronary artery disease      strong fam hx  . Breast cancer Neg Hx     Social History Social History  Substance Use Topics  .  Smoking status: Former Smoker    Quit date: 11/05/1990  . Smokeless tobacco: Never Used  . Alcohol Use: No    Review of Systems Constitutional: No fever/chills Eyes: No visual changes. ENT: No sore throat. Cardiovascular: Denies chest pain. Respiratory: Denies shortness of breath. Gastrointestinal: No abdominal pain.  No nausea, no vomiting.  No diarrhea.  No constipation. Genitourinary: Negative for dysuria. Musculoskeletal: Negative for back pain. Skin: Negative for rash. Neurological: Negative for headaches, focal weakness or numbness.  10-point ROS otherwise negative.  ____________________________________________   PHYSICAL EXAM:  VITAL SIGNS: ED Triage Vitals  Enc Vitals Group     BP 05/21/16 1131 163/74 mmHg     Pulse Rate 05/21/16 1131 86     Resp 05/21/16 1131 18     Temp 05/21/16 1151 95.4 F (35.2 C)     Temp Source 05/21/16 1151 Rectal     SpO2 05/21/16 1131 96 %     Weight 05/21/16 1131 214 lb 15.2 oz (97.5 kg)     Height 05/21/16 1131 5\' 7"  (1.702 m)     Head Cir --      Peak Flow --      Pain Score --      Pain Loc --      Pain Edu? --      Excl. in [place]? --     Constitutional: Alert and oriented to self and place and year but confused about what day it is, what medicines she has taken today, which she has eaten. Nontoxic-appearing and in no acute distress. Eyes: Conjunctivae are normal. PERRL. EOMI. Head: Atraumatic. Nose: No congestion/rhinnorhea. Mouth/Throat: Mucous membranes are moist.  Oropharynx non-erythematous. Neck: No stridor.  Supple without meningismus. Cardiovascular: Normal rate, regular rhythm. Grossly normal heart sounds.  Good peripheral circulation. Respiratory: Normal respiratory effort.  No retractions. Lungs CTAB. Gastrointestinal: Soft and nontender. No distention. PT catheter in the abdomen without surrounding erythema him a tenderness or drainage. No CVA tenderness. Genitourinary: deferred Musculoskeletal: No lower extremity  tenderness nor edema.  No joint effusions. Neurologic:  Normal speech and language. No gross focal neurologic deficits are appreciated. 5 strength in bilateral upper and lower extremity, sensation intact to light touch throughout. Skin:  Skin is warm, dry and intact. No rash noted. Psychiatric: Mood and affect are normal. Speech and behavior are normal.  ____________________________________________   LABS (all labs ordered are listed, but only abnormal results are displayed)  Labs Reviewed  CBC WITH DIFFERENTIAL/PLATELET - Abnormal; Notable for the following:    WBC 12.9 (*)    RBC 2.09 (*)    Hemoglobin 7.3 (*)    HCT 21.8 (*)    MCV 104.3 (*)    MCH 34.9 (*)    RDW 16.7 (*)    Neutro Abs 11.0 (*)    All other components within normal limits  COMPREHENSIVE METABOLIC PANEL - Abnormal; Notable for the following:    Chloride 97 (*)    Glucose, Bld 105 (*)  BUN 87 (*)    Creatinine, Ser 13.30 (*)    Calcium 7.8 (*)    Albumin 3.3 (*)    ALT 72 (*)    GFR calc non Af Amer 2 (*)    GFR calc Af Amer 3 (*)    Anion gap 18 (*)    All other components within normal limits  TROPONIN I - Abnormal; Notable for the following:    Troponin I 0.06 (*)    All other components within normal limits  GLUCOSE, CAPILLARY - Abnormal; Notable for the following:    Glucose-Capillary 105 (*)    All other components within normal limits  GLUCOSE, CAPILLARY - Abnormal; Notable for the following:    Glucose-Capillary 121 (*)    All other components within normal limits  CULTURE, BLOOD (ROUTINE X 2)  CULTURE, BLOOD (ROUTINE X 2)  LACTIC ACID, PLASMA  TSH  T4, FREE  GLUCOSE, CAPILLARY  LACTIC ACID, PLASMA  URINALYSIS COMPLETEWITH MICROSCOPIC ([facility] ONLY)   ____________________________________________  EKG  ED ECG REPORT I, Joanne Gavel, the attending physician, personally viewed and interpreted this ECG.   Date: 05/21/2016  EKG Time: 11:42  Rate: 84  Rhythm: normal sinus rhythm   Axis: left  Intervals:none  ST&T Change: No acute ST elevation or acute ST depression. Prolonged QTC at 510 ms.  ____________________________________________  RADIOLOGY  CXR ____________________________________________   PROCEDURES  Procedure(s) performed: None  Procedures  Critical Care performed: No  ____________________________________________   INITIAL IMPRESSION / ASSESSMENT AND PLAN / ED COURSE  Pertinent labs & imaging results that were available during my care of the patient were reviewed by me and considered in my medical decision making (see chart for details).  Laurie Steele is a 75 y.o. female with end-stage renal disease on nightly peritoneal dialysis, diabetes, GERD, hypertension and hyperlipidemia who presents for evaluation of confusion, severe generalized weakness as well as hypoglycemia today. On exam, she is nontoxic appearing and in no acute distress. Her vital signs are notable for hypothermia, rectal temperature is 95.4. The remainder of her vital signs are stable and she is afebrile. She has an intact neurological examination and is awake, alert and oriented though mildly confused about what happened today. Left sugar on arrival was in the 100s. I will encourage her to eat and we will check serial glucose measurements. Her hypothermia is concerning, we'll obtain labs, blood cultures, lactic acid, chest x-ray, thyroid studies and apply bear hugger.  ----------------------------------------- 2:13 PM on 05/21/2016 ----------------------------------------- Temp improving to 96.6. She is awake, alert, oriented, eating and drinking. Her other vital signs remain stable. I reviewed her labs which show mild leukocytosis, chronic stable anemia. CMP with creatinine elevation at 13 however this is expected in the setting of end-stage renal disease, normal potassium. Troponin is elevated at 0.06 however this appears to be a chronic elevation. Normal thyroid studies.  Normal chest x-ray. She meets 2 out of 4 Sirs criteria for hypothermia as well as leukocytosis, I've given IV vancomycin and Zosyn. She is maintaining adequate blood pressure, mentating appropriately, has a reassuring venous lactic acid so will not give 30 mL/kg bolus of normal saline due to risk of precipitating flash pulmonary edema in this patient with end-stage renal disease on dialysis. I discussed the case with Dr. Lenore Cordia for admission. UA pending at time of admission.  ____________________________________________   FINAL CLINICAL IMPRESSION(S) / ED DIAGNOSES  Final diagnoses:  Hypothermia, initial encounter  Hypoglycemia  Weakness  NEW MEDICATIONS STARTED DURING THIS VISIT:  New Prescriptions   No medications on file     Note:  This document was prepared using Dragon voice recognition software and may include unintentional dictation errors.    Joanne Gavel, MD 05/21/16 1415

## 2016-05-21 NOTE — H&P (Signed)
[facility] [place] NAME: Laurie Steele    MR#:  [id number]  DATE OF BIRTH:  Nov 04, 1941  DATE OF ADMISSION:  05/21/2016  PRIMARY CARE PHYSICIAN: [name], MD   REQUESTING/REFERRING PHYSICIAN: Dr. Darrick Penna   CHIEF COMPLAINT:   Chief Complaint  Patient presents with  . Hypoglycemia    HISTORY OF PRESENT ILLNESS:  Laurie Steele  is a 75 y.o. female with a known history of Stage renal disease on peritoneal dialysis, insulin-dependent diabetes, glaucoma, GERD, history of gout, hypertension, hyperlipidemia who presents to the hospital due to altered mental status. Patient apparently was found lethargic and altered at her home by her son who called EMS. Upon EMS arrival she was noted to be hypoglycemic with blood sugars in the 30s. She was brought to the emergency room and given some dextrose and her blood sugars have improved and is currently in the 70s to 80s. She was also noted to be hypothermic with the a temperature of 95.4. Patient was in usual state of health and denies any complaints prior to her being found altered and lethargic this morning. She denies any dysuria, abdominal pain, nausea, vomiting, chest pain, shortness of breath or any other associated symptoms presently. Given her persistent hypoglycemia and hypothermia Hospital services were contacted further treatment and evaluation.  PAST MEDICAL HISTORY:   Past Medical History  Diagnosis Date  . Hx of colonic polyps   . Hyperlipidemia   . Hypertension   . Renal failure     Dr Holley Raring  . 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 Chronic anemia     Dr Inez Pilgrim  . Glaucoma   . GERD (gastroesophageal reflux disease)   . Gout   . Miscarriage   . Vaginal delivery     x 4  . Peritoneal dialysis status ([place])   .  Lower back pain     PAST SURGICAL HISTORY:   Past Surgical History  Procedure Laterality Date  . Cholecystectomy    . Abdominal hysterectomy  1983  . Back surgery  6/08    Dr Collier Salina  . Back surgery  1987    disc  . Peritoneal catheter insertion      SOCIAL HISTORY:   Social History  Substance Use Topics  . Smoking status: Former Smoker    Quit date: 11/05/1990  . Smokeless tobacco: Never Used  . Alcohol Use: No    FAMILY HISTORY:   Family History  Problem Relation Age of Onset  . Heart attack Father   . Hypertension Mother   . Hyperlipidemia Mother   . Coronary artery disease      strong fam hx  . Breast cancer Neg Hx     DRUG ALLERGIES:  No Known Allergies  REVIEW OF SYSTEMS:   Review of Systems  Constitutional: Positive for chills. Negative for fever and weight loss.  HENT: Negative for congestion, nosebleeds and tinnitus.   Eyes: Negative for blurred vision, double vision and redness.  Respiratory: Negative for cough, hemoptysis and shortness of breath.   Cardiovascular: Negative for chest pain, orthopnea, leg swelling and PND.  Gastrointestinal: Negative for nausea, vomiting, abdominal pain, diarrhea and melena.  Genitourinary: Negative for dysuria, urgency and hematuria.  Musculoskeletal: Negative for joint pain and falls.  Neurological: Negative  for dizziness, tingling, sensory change, focal weakness, seizures, weakness and headaches.  Endo/Heme/Allergies: Negative for polydipsia. Does not bruise/bleed easily.  Psychiatric/Behavioral: Negative for depression and memory loss. The patient is not nervous/anxious.     MEDICATIONS AT HOME:   Prior to Admission medications   Medication Sig Start Date End Date Taking? Authorizing Provider  allopurinol (ZYLOPRIM) 100 MG tablet Take 1 tablet by mouth daily.    Yes Historical Provider, MD  ALPRAZolam Duanne Moron) 0.25 MG tablet Take 1 tablet by mouth at bedtime as needed for sleep.    Yes Historical Provider,  MD  amitriptyline (ELAVIL) 25 MG tablet Take 1 tablet by mouth at bedtime as needed (For foot pain).  01/02/16  Yes Historical Provider, MD  amLODipine (NORVASC) 10 MG tablet Take 10 mg by mouth daily. Take it mid-day 03/20/16  Yes Historical Provider, MD  aspirin EC 325 MG tablet Take 325 mg by mouth daily.   Yes Historical Provider, MD  atenolol (TENORMIN) 25 MG tablet Take 1 tablet by mouth daily.   Yes Historical Provider, MD  B Complex-C-Folic Acid (DIALYVITE Q000111Q PO) Take 1 tablet by mouth daily.   Yes Historical Provider, MD  cinacalcet (SENSIPAR) 90 MG tablet Take 90 mg by mouth daily. With lunch   Yes Historical Provider, MD  cloNIDine (CATAPRES) 0.3 MG tablet Take 0.3 mg by mouth 2 (two) times daily.   Yes Historical Provider, MD  cyclobenzaprine (FLEXERIL) 5 MG tablet Take 1 tablet by mouth 3 (three) times daily as needed for muscle spasms.    Yes Historical Provider, MD  fluticasone (FLONASE) 50 MCG/ACT nasal spray Place 2 sprays into both nostrils daily as needed for allergies.    Yes Historical Provider, MD  gabapentin (NEURONTIN) 100 MG capsule Take 1 capsule by mouth 3 (three) times daily. 08/09/15 08/08/16 Yes Historical Provider, MD  HUMALOG 100 UNIT/ML injection Inject 20-48 Units into the skin 2 (two) times daily. Pt. Takes 20 units before breakfast and 48 units before supper. 02/16/16  Yes Historical Provider, MD  hydrALAZINE (APRESOLINE) 50 MG tablet Take 50 mg by mouth 3 (three) times daily.     Yes Historical Provider, MD  HYDROcodone-acetaminophen (NORCO/VICODIN) 5-325 MG tablet Take 1 tablet by mouth 2 (two) times daily as needed for moderate pain.    Yes Historical Provider, MD  insulin glargine (LANTUS) 100 UNIT/ML injection Inject 0.8 mLs (80 Units total) into the skin at bedtime. Patient taking differently: Inject 70 Units into the skin at bedtime.  05/28/13  Yes Venia Carbon, MD  ipratropium (ATROVENT) 0.03 % nasal spray Place 1 spray into both nostrils 3 (three) times  daily as needed for rhinitis.   Yes Historical Provider, MD  isosorbide mononitrate (IMDUR) 30 MG 24 hr tablet Take 30 mg by mouth 2 (two) times daily.   Yes Historical Provider, MD  latanoprost (XALATAN) 0.005 % ophthalmic solution Place 1 drop into both eyes at bedtime. 03/20/16  Yes Historical Provider, MD  losartan (COZAAR) 100 MG tablet Take 100 mg by mouth daily. 03/20/16  Yes Historical Provider, MD  metoCLOPramide (REGLAN) 10 MG tablet Take 10 mg by mouth 3 (three) times daily as needed for nausea.    Yes Historical Provider, MD  pravastatin (PRAVACHOL) 80 MG tablet Take 1 tablet (80 mg total) by mouth daily. Patient taking differently: Take 80 mg by mouth at bedtime.  01/27/14  Yes Venia Carbon, MD  predniSONE (DELTASONE) 10 MG tablet Take 10-60 mg by mouth daily. 60 mg daily  on day 1, then 50 mg daily on day 2, then 40 mg daily on day 3, then 30 mg daily on day 4, then 20 mg daily on day 5, then 10 mg daily on day 6, then stop 05/19/16  Yes Historical Provider, MD  sevelamer (RENVELA) 800 MG tablet Take 2,400 mg by mouth 3 (three) times daily with meals.    Yes Historical Provider, MD      VITAL SIGNS:  Blood pressure 163/74, pulse 86, temperature 95.4 F (35.2 C), temperature source Rectal, resp. rate 18, height 5\' 7"  (1.702 m), weight 97.5 kg (214 lb 15.2 oz), SpO2 96 %.  PHYSICAL EXAMINATION:  Physical Exam  GENERAL:  75 y.o.-year-old patient lying in the bed in no acute distress.  EYES: Pupils equal, round, reactive to light and accommodation. No scleral icterus. Extraocular muscles intact.  HEENT: Head atraumatic, normocephalic. Oropharynx and nasopharynx clear. No oropharyngeal erythema, moist oral mucosa  NECK:  Supple, no jugular venous distention. No thyroid enlargement, no tenderness.  LUNGS: Normal breath sounds bilaterally, no wheezing, rales, rhonchi. No use of accessory muscles of respiration.  CARDIOVASCULAR: S1, S2 RRR. No murmurs, rubs, gallops, clicks.  ABDOMEN:  Soft, nontender, nondistended. Bowel sounds present. No organomegaly or mass. Positive peritoneal dialysis catheter in place without any acute drainage noted. EXTREMITIES: + 1 edema b/l, No cyanosis, or clubbing. + 2 pedal & radial pulses b/l.   NEUROLOGIC: Cranial nerves II through XII are intact. No focal Motor or sensory deficits appreciated b/l PSYCHIATRIC: The patient is alert and oriented x 3. Good affect.  SKIN: No obvious rash, lesion, or ulcer.   LABORATORY PANEL:   CBC  Recent Labs Lab 05/21/16 1136  WBC 12.9*  HGB 7.3*  HCT 21.8*  PLT 201   ------------------------------------------------------------------------------------------------------------------  Chemistries   Recent Labs Lab 05/21/16 1136  NA 138  K 3.7  CL 97*  CO2 23  GLUCOSE 105*  BUN 87*  CREATININE 13.30*  CALCIUM 7.8*  AST 38  ALT 72*  ALKPHOS 92  BILITOT 0.4   ------------------------------------------------------------------------------------------------------------------  Cardiac Enzymes  Recent Labs Lab 05/21/16 1136  TROPONINI 0.06*   ------------------------------------------------------------------------------------------------------------------  RADIOLOGY:  Dg Chest 2 View  05/21/2016  CLINICAL DATA:  Hypoglycemia and hypothermia. EXAM: CHEST  2 VIEW COMPARISON:  05/11/2016 FINDINGS: The heart size and mediastinal contours are within normal limits. Both lungs are clear. The visualized skeletal structures are unremarkable. IMPRESSION: Normal chest x-ray. Electronically Signed   By: Marijo Sanes M.D.   On: 05/21/2016 12:57     IMPRESSION AND PLAN:   75 year old female with past history of diabetes insulin-dependent, end-stage renal disease on peritoneal dialysis, essential hypertension, glaucoma, history of gout, hyperlipidemia who presents to the hospital due to altered mental status and noted to be hypoglycemic and also hypothermic.  1. Altered mental status-metabolic  encephalopathy secondary to the hypothermia and hypoglycemia. -Mental status much improved now as patient's blood sugars have improved and also with a bear hugger. -Follow mental status and follow blood sugars closely.  2. Hypoglycemia-etiology unclear. Patient's insulin dose has not been changed recently. -I will hold her Lantus and also NovoLog twice daily. Place on sliding scale insulin. Check hemoglobin A1c and get a diabetes coordinator consult. -Follow blood sugars closely.  3. Hypothermia-etiology unclear. Possible underlying sepsis but source unclear. -Patient has a bear heartburn her temperature is improving. Empirically patient has been given a dose of vancomycin, Zosyn. -Her chest x-ray, urinalysis were negative presently. Possible source could be her peritoneal fluid  as she is on peritoneal dialysis. Discussed this with nephrology who plans on doing a peritoneal fluid culture.   4. End-stage renal disease on peritoneal dialysis-nephrology has been consulted. Continue care as per them. -Culture peritoneal fluid.  5. Essential hypertension-continue atenolol, amlodipine, clonidine, hydralazine, losartan.  6. Secondary hyperparathyroidism-continue Sensipar, Renvela.  7. Diabetic neuropathy-continue gabapentin.  8. Hyperlipidemia-continue Pravachol.  9. History of gout-no acute attack. Continue allopurinol.   All the records are reviewed and case discussed with ED provider. Management plans discussed with the patient, family and they are in agreement.  CODE STATUS: Full  TOTAL TIME TAKING CARE OF THIS PATIENT: 45 minutes.    Henreitta Leber M.D on 05/21/2016 at 2:30 PM  Between 7am to 6pm - Pager - [phone]  After 6pm go to [email or web address] - password [email or web address] [facility]  Office  [phone]  CC: Primary care physician; [name], MD

## 2016-05-21 NOTE — Progress Notes (Signed)
Pre-peritoneal dialysis

## 2016-05-21 NOTE — ED Notes (Signed)
Patient arrived by EMS from home. When EMS arrived they got a blood sugar of 31, gave an amp of D50 and blood sugar was 210. Blood sugar 105 upon arrival to ED. Patient was last seen normal last night at midnight. Patient reports the last thing she remembers is being hungry this morning. Patient currently gets dialysis and has HX of diabetes. Patient A&O x4 upon arrival to ED

## 2016-05-21 NOTE — Progress Notes (Signed)
Subjective:   Patient is known to our practice from outpatient dialysis She is followed by Dr. Holley Raring for peritoneal dialysis She presents for hypoglycemia Patient states that her blood sugar was recorded low at 33. She did not feel well therefore presented for evaluation   Objective:  Vital signs in last 24 hours:  Temp:  [95.4 F (35.2 C)-98.6 F (37 C)] 98.6 F (37 C) (07/17 1620) Pulse Rate:  [86-95] 95 (07/17 1620) Resp:  [14-29] 18 (07/17 1620) BP: (152-166)/(54-74) 152/54 mmHg (07/17 1620) SpO2:  [96 %-100 %] 98 % (07/17 1620) Weight:  [97.5 kg (214 lb 15.2 oz)] 97.5 kg (214 lb 15.2 oz) (07/17 1131)  Weight change:  Filed Weights   05/21/16 1131  Weight: 97.5 kg (214 lb 15.2 oz)    Intake/Output:    Intake/Output Summary (Last 24 hours) at 05/21/16 1756 Last data filed at 05/21/16 1600  Gross per 24 hour  Intake      0 ml  Output    225 ml  Net   -225 ml     Physical Exam: General: No acute distress, sitting in bed eating dinner   HEENT Anicteric, moist mucous membranes   Neck Supple   Pulm/lungs Lungs are clear to auscultation bilaterally, normal breathing effort   CVS/Heart Regular rhythm, no rub or gallop   Abdomen:  Soft, nontender, exit site nontender   Extremities: Trace peripheral edema   Neurologic: Alert, oriented   Skin: No acute rashes   Access: PD catheter        Basic Metabolic Panel:   Recent Labs Lab 05/15/16 1416 05/21/16 1136  NA 136 138  K 4.0 3.7  CL 95* 97*  CO2 22 23  GLUCOSE 97 105*  BUN 106* 87*  CREATININE 13.92* 13.30*  CALCIUM 7.7* 7.8*     CBC:  Recent Labs Lab 05/15/16 1416 05/21/16 1136  WBC 11.0 12.9*  NEUTROABS 6.8* 11.0*  HGB 7.5* 7.3*  HCT 21.9* 21.8*  MCV 103.7* 104.3*  PLT 189 201      Microbiology:  Recent Results (from the past 720 hour(s))  Surgical pcr screen     Status: None   Collection Time: 05/15/16  2:16 PM  Result Value Ref Range Status   MRSA, PCR NEGATIVE NEGATIVE Final    Staphylococcus aureus NEGATIVE NEGATIVE Final    Comment:        The Xpert SA Assay (FDA approved for NASAL specimens in patients over 80 years of age), is one component of a comprehensive surveillance program.  Test performance has been validated by [facility] for patients greater than or equal to 91 year old. It is not intended to diagnose infection nor to guide or monitor treatment.     Coagulation Studies: No results for input(s): LABPROT, INR in the last 72 hours.  Urinalysis:  Recent Labs  05/21/16 1420  COLORURINE YELLOW*  LABSPEC 1.013  PHURINE 5.0  GLUCOSEU NEGATIVE  HGBUR 2+*  BILIRUBINUR NEGATIVE  KETONESUR NEGATIVE  PROTEINUR 30*  NITRITE NEGATIVE  LEUKOCYTESUR 2+*      Imaging: Dg Chest 2 View  05/21/2016  CLINICAL DATA:  Hypoglycemia and hypothermia. EXAM: CHEST  2 VIEW COMPARISON:  05/11/2016 FINDINGS: The heart size and mediastinal contours are within normal limits. Both lungs are clear. The visualized skeletal structures are unremarkable. IMPRESSION: Normal chest x-ray. Electronically Signed   By: Marijo Sanes M.D.   On: 05/21/2016 12:57     Medications:     . allopurinol  100 mg Oral Daily  . amLODipine  10 mg Oral Q1500  . aspirin EC  325 mg Oral Daily  . atenolol  25 mg Oral Daily  . cinacalcet  90 mg Oral Q lunch  . cloNIDine  0.3 mg Oral BID  . gabapentin  100 mg Oral TID  . heparin  5,000 Units Subcutaneous Q8H  . hydrALAZINE  50 mg Oral TID  . insulin aspart  0-5 Units Subcutaneous QHS  . insulin aspart  0-9 Units Subcutaneous TID WC  . isosorbide mononitrate  30 mg Oral BID  . latanoprost  1 drop Both Eyes QHS  . losartan  100 mg Oral Daily  . piperacillin-tazobactam (ZOSYN)  IV  3.375 g Intravenous Once  . pravastatin  80 mg Oral QHS  . predniSONE  40 mg Oral Q breakfast   And  . [START ON 05/22/2016] predniSONE  30 mg Oral Q breakfast   And  . [START ON 05/23/2016] predniSONE  20 mg Oral Q breakfast   And  . [START ON  05/24/2016] predniSONE  10 mg Oral Q breakfast  . sevelamer carbonate  2,400 mg Oral TID WC   acetaminophen **OR** acetaminophen, ALPRAZolam, amitriptyline, cyclobenzaprine, fluticasone, HYDROcodone-acetaminophen, metoCLOPramide, ondansetron **OR** ondansetron (ZOFRAN) IV  Assessment/ Plan:  75 y.o. female end-stage renal disease, peritoneal dialysis, insulin-dependent diabetes, with complications of retinopathy, nephropathy, glaucoma, GERD, history of gout, hypertension, hyperlipidemia  1. End-stage renal disease We will continue her home doses regimen. Patient reports 1700 cc 6 exchanges  2. Secondary hyperparathyroidism Monitor phosphorus Continue sevelamer home dose  3. Anemia of chronic kidney disease We'll start her on Procrit Iron studies  4. Hypoglycemia, - Workup in progress to evaluate for infection Patient received empiric antibiotics in the emergency room We will send PD fluid  for cell count and culture   LOS: 0 Sonya Gunnoe 7/17/20175:56 PM

## 2016-05-21 NOTE — Progress Notes (Signed)
Peritoneal Dialysis started

## 2016-05-22 LAB — BLOOD CULTURE ID PANEL (REFLEXED)
Acinetobacter baumannii: NOT DETECTED
CANDIDA ALBICANS: NOT DETECTED
CANDIDA TROPICALIS: NOT DETECTED
CARBAPENEM RESISTANCE: NOT DETECTED
Candida glabrata: NOT DETECTED
Candida krusei: NOT DETECTED
Candida parapsilosis: NOT DETECTED
ENTEROBACTER CLOACAE COMPLEX: NOT DETECTED
ENTEROCOCCUS SPECIES: NOT DETECTED
Enterobacteriaceae species: NOT DETECTED
Escherichia coli: NOT DETECTED
HAEMOPHILUS INFLUENZAE: NOT DETECTED
Klebsiella oxytoca: NOT DETECTED
Klebsiella pneumoniae: NOT DETECTED
LISTERIA MONOCYTOGENES: NOT DETECTED
METHICILLIN RESISTANCE: NOT DETECTED
Neisseria meningitidis: NOT DETECTED
PROTEUS SPECIES: NOT DETECTED
Pseudomonas aeruginosa: NOT DETECTED
STAPHYLOCOCCUS AUREUS BCID: NOT DETECTED
STAPHYLOCOCCUS SPECIES: NOT DETECTED
STREPTOCOCCUS PNEUMONIAE: NOT DETECTED
STREPTOCOCCUS PYOGENES: NOT DETECTED
Serratia marcescens: NOT DETECTED
Streptococcus agalactiae: DETECTED — AB
Streptococcus species: DETECTED — AB
VANCOMYCIN RESISTANCE: NOT DETECTED

## 2016-05-22 LAB — GLUCOSE, CAPILLARY
Glucose-Capillary: 393 mg/dL — ABNORMAL HIGH (ref 65–99)
Glucose-Capillary: 217 mg/dL — ABNORMAL HIGH (ref 65–99)
Glucose-Capillary: 196 mg/dL — ABNORMAL HIGH (ref 65–99)
Glucose-Capillary: 272 mg/dL — ABNORMAL HIGH (ref 65–99)

## 2016-05-22 LAB — BASIC METABOLIC PANEL
Anion gap: 16 — ABNORMAL HIGH (ref 5–15)
BUN: 96 mg/dL — ABNORMAL HIGH (ref 6–20)
CALCIUM: 7.9 mg/dL — AB (ref 8.9–10.3)
CO2: 23 mmol/L (ref 22–32)
CREATININE: 12.82 mg/dL — AB (ref 0.44–1.00)
Chloride: 98 mmol/L — ABNORMAL LOW (ref 101–111)
GFR calc non Af Amer: 2 mL/min — ABNORMAL LOW (ref >60)
GFR, EST AFRICAN AMERICAN: 3 mL/min — AB (ref >60)
Glucose, Bld: 402 mg/dL — ABNORMAL HIGH (ref 65–99)
Potassium: 4.6 mmol/L (ref 3.5–5.1)
SODIUM: 137 mmol/L (ref 135–145)

## 2016-05-22 LAB — IRON AND TIBC
IRON: 135 ug/dL (ref 28–170)
Saturation Ratios: 55 % — ABNORMAL HIGH (ref 10.4–31.8)
TIBC: 248 ug/dL — ABNORMAL LOW (ref 250–450)
UIBC: 113 ug/dL

## 2016-05-22 LAB — CBC
HCT: 21.1 % — ABNORMAL LOW (ref 35.0–47.0)
Hemoglobin: 6.9 g/dL — ABNORMAL LOW (ref 12.0–16.0)
MCH: 34.2 pg — ABNORMAL HIGH (ref 26.0–34.0)
MCHC: 32.7 g/dL (ref 32.0–36.0)
MCV: 104.4 fL — ABNORMAL HIGH (ref 80.0–100.0)
PLATELETS: 186 10*3/uL (ref 150–440)
RBC: 2.02 MIL/uL — AB (ref 3.80–5.20)
RDW: 16.9 % — ABNORMAL HIGH (ref 11.5–14.5)
WBC: 12.5 10*3/uL — AB (ref 3.6–11.0)

## 2016-05-22 LAB — TRANSFERRIN: Transferrin: 176 mg/dL — ABNORMAL LOW (ref 192–382)

## 2016-05-22 LAB — BODY FLUID CELL COUNT WITH DIFFERENTIAL
EOS FL: 0 %
Lymphs, Fluid: 32 %
Monocyte-Macrophage-Serous Fluid: 63 %
Neutrophil Count, Fluid: 5 %
Other Cells, Fluid: 0 %
WBC FLUID: 36 uL

## 2016-05-22 LAB — HEMOGLOBIN A1C: Hgb A1c MFr Bld: 6.3 % — ABNORMAL HIGH (ref 4.0–6.0)

## 2016-05-22 LAB — PATHOLOGIST SMEAR REVIEW

## 2016-05-22 LAB — FERRITIN: FERRITIN: 1709 ng/mL — AB (ref 11–307)

## 2016-05-22 MED ORDER — DELFLEX-LC/1.5% DEXTROSE 346 MOSM/L IP SOLN
INTRAPERITONEAL | Status: DC
  Administered 2016-05-22: 18:00:00 via INTRAPERITONEAL
  Administered 2016-05-27 (×2): 12 L via INTRAPERITONEAL
  Filled 2016-05-22: qty 3000

## 2016-05-22 MED ORDER — INSULIN GLARGINE 100 UNIT/ML ~~LOC~~ SOLN
35.0000 [IU] | Freq: Every morning | SUBCUTANEOUS | Status: DC
  Administered 2016-05-22 – 2016-05-24 (×3): 35 [IU] via SUBCUTANEOUS
  Filled 2016-05-22 (×3): qty 0.35

## 2016-05-22 MED ORDER — DEXTROSE 5 % IV SOLN
500.0000 mg | Freq: Two times a day (BID) | INTRAVENOUS | Status: DC
  Administered 2016-05-22 – 2016-05-26 (×10): 500 mg via INTRAVENOUS
  Filled 2016-05-22 (×11): qty 5

## 2016-05-22 MED ORDER — EPOETIN ALFA 20000 UNIT/ML IJ SOLN
20000.0000 [IU] | INTRAMUSCULAR | Status: DC
  Filled 2016-05-22: qty 1

## 2016-05-22 MED ORDER — GENTAMICIN SULFATE 0.1 % EX CREA
1.0000 "application " | TOPICAL_CREAM | Freq: Every day | CUTANEOUS | Status: DC
  Administered 2016-05-22 – 2016-05-27 (×6): 1 via TOPICAL
  Filled 2016-05-22 (×2): qty 15

## 2016-05-22 NOTE — Progress Notes (Signed)
Pharmacy Antibiotic Follow-up Note  Laurie Steele is a 75 y.o. year-old female admitted on 05/21/2016.  The patient is currently on peritoneal dialysis.  Assessment/Plan: Lab reports positive blood culture, GPC, strep agalactiae in anaerobic bottles of 2 sets. Spoke with Dr. Jannifer Franklin, okay to begin cefazolin 500 mg IV Q12H. Peritoneal fluid culture pending.  Temp (24hrs), Avg:97.3 F (36.3 C), Min:95.4 F (35.2 C), Max:99 F (37.2 C)   Recent Labs Lab 05/15/16 1416 05/21/16 1136  WBC 11.0 12.9*    Recent Labs Lab 05/15/16 1416 05/21/16 1136  CREATININE 13.92* 13.30*   Estimated Creatinine Clearance: 4.3 mL/min (by C-G formula based on Cr of 13.3).    No Known Allergies   Thank you for allowing pharmacy to be a part of this patient's care.  Laural Benes, Pharm.D., BCPS Clinical Pharmacist 05/22/2016 1:40 AM

## 2016-05-22 NOTE — Progress Notes (Addendum)
Inpatient Diabetes Program Recommendations  AACE/ADA: New Consensus Statement on Inpatient Glycemic Control (2015)  Target Ranges:  Prepandial:   less than 140 mg/dL      Peak postprandial:   less than 180 mg/dL (1-2 hours)      Critically ill patients:  140 - 180 mg/dL   Lab Results  Component Value Date   GLUCAP 393* 05/22/2016   HGBA1C 6.3* 05/21/2016    Review of Glycemic Control:  Results for SHELLBY, MELTZER (MRN [id number]) as of 05/22/2016 08:20  Ref. Range 05/21/2016 12:29 05/21/2016 13:51 05/21/2016 17:21 05/21/2016 21:26 05/22/2016 07:43  Glucose-Capillary Latest Ref Range: 65-99 mg/dL 72 121 (H) 130 (H) 108 (H) 393 (H)    Diabetes history: Type 2 diabetes Outpatient Diabetes medications: Humalog 20 units breakfast and 48 units supper, Lantus 70 units q HS Current orders for Inpatient glycemic control:  Novolog sensitive tid with meals and HS  Inpatient Diabetes Program Recommendations:    Please consider restarting 1/2 of home doses of insulin.  Consider Lantus 35 units (give first dose this AM) daily.  Also consider Novolog meal coverage 10 units tid with meals (Hold if patient eats less than 50%).  Note that without basal insulin CBG's increased.  Will likely need titration of insulin doses. Patient see's Dr. Gabriel Carina and would benefit from f/u visit with her ASAP after discharge.    Thanks, Adah Perl, RN, BC-ADM Inpatient Diabetes Coordinator Pager [phone] (8a-5p)  10:15 a Addendum:  Discussed low blood sugar treatment, signs and symptoms with patient.  Patient states that she self administers insulin at home.  Discussed importance of checking blood sugars closely and recognizing signs and symptoms of low blood sugars.  She states that she usually treats lows with juice.  Discussed 15/15 rule.  Gave patient handout regarding hypoglycemia and treatment and blood sugar diary.  Patient verbalized understanding.  Unsure of cause of low blood sugars at this time.  Agree  with restart of 1/2 of basal insulin.  Will need follow-up with Dr. Gabriel Carina.

## 2016-05-22 NOTE — Progress Notes (Signed)
Subjective:   Patient is known to our practice from outpatient dialysis She is followed by Dr. Holley Raring for peritoneal dialysis She presents for hypoglycemia  Underwent peritoneal dialysis overnight. No problems reported Peritoneal dialysis fluid evaluation is negative for peritonitis Today she complains of right arm pain   Objective:  Vital signs in last 24 hours:  Temp:  [96.1 F (35.6 C)-99 F (37.2 C)] 98.8 F (37.1 C) (07/18 0845) Pulse Rate:  [72-95] 72 (07/18 0845) Resp:  [14-29] 18 (07/18 0845) BP: (106-166)/(35-84) 142/35 mmHg (07/18 0845) SpO2:  [93 %-100 %] 100 % (07/18 0845)  Weight change:  Filed Weights   05/21/16 1131  Weight: 97.5 kg (214 lb 15.2 oz)    Intake/Output:    Intake/Output Summary (Last 24 hours) at 05/22/16 1248 Last data filed at 05/22/16 0945  Gross per 24 hour  Intake    240 ml  Output   1100 ml  Net   -860 ml     Physical Exam: General: No acute distress,   HEENT Anicteric, moist mucous membranes   Neck Supple   Pulm/lungs Lungs are clear to auscultation bilaterally, normal breathing effort   CVS/Heart Regular rhythm, no rub or gallop   Abdomen:  Soft, nontender, exit site nontender   Extremities: Trace peripheral edema   Neurologic: Alert, oriented   Skin: No acute rashes   Access: PD catheter        Basic Metabolic Panel:   Recent Labs Lab 05/15/16 1416 05/21/16 1136 05/22/16 0444  NA 136 138 137  K 4.0 3.7 4.6  CL 95* 97* 98*  CO2 22 23 23   GLUCOSE 97 105* 402*  BUN 106* 87* 96*  CREATININE 13.92* 13.30* 12.82*  CALCIUM 7.7* 7.8* 7.9*     CBC:  Recent Labs Lab 05/15/16 1416 05/21/16 1136 05/22/16 0444  WBC 11.0 12.9* 12.5*  NEUTROABS 6.8* 11.0*  --   HGB 7.5* 7.3* 6.9*  HCT 21.9* 21.8* 21.1*  MCV 103.7* 104.3* 104.4*  PLT 189 201 186      Microbiology:  Recent Results (from the past 720 hour(s))  Surgical pcr screen     Status: None   Collection Time: 05/15/16  2:16 PM  Result Value Ref  Range Status   MRSA, PCR NEGATIVE NEGATIVE Final   Staphylococcus aureus NEGATIVE NEGATIVE Final    Comment:        The Xpert SA Assay (FDA approved for NASAL specimens in patients over 69 years of age), is one component of a comprehensive surveillance program.  Test performance has been validated by [facility] for patients greater than or equal to 70 year old. It is not intended to diagnose infection nor to guide or monitor treatment.   Blood culture (routine x 2)     Status: None (Preliminary result)   Collection Time: 05/21/16 12:01 PM  Result Value Ref Range Status   Specimen Description   Final    BLOOD RIGHT ANTECUBITAL Performed at [facility]    Special Requests BOTTLES DRAWN AEROBIC AND ANAEROBIC 4CC  Final   Culture  Setup Time   Final    GRAM POSITIVE COCCI ANAEROBIC BOTTLE ONLY CRITICAL RESULT CALLED TO, READ BACK BY AND VERIFIED WITH: NATE COOKSON 05/22/16 0130 TLB/SGD    Culture GRAM POSITIVE COCCI  Final   Report Status PENDING  Incomplete  Blood culture (routine x 2)     Status: None (Preliminary result)   Collection Time: 05/21/16 12:01 PM  Result Value Ref Range  Status   Specimen Description BLOOD LEFT HAND  Final   Special Requests BOTTLES DRAWN AEROBIC AND ANAEROBIC  [place]  Final   Culture  Setup Time   Final    GRAM POSITIVE COCCI ANAEROBIC BOTTLE ONLY CRITICAL RESULT CALLED TO, READ BACK BY AND VERIFIED WITH: [place] ON 05/22/16 AT 0130 BY TLB CONFIRMED BY TLB/[facility]    Culture   Final    GRAM POSITIVE COCCI ANAEROBIC BOTTLE ONLY IDENTIFICATION TO FOLLOW    Report Status PENDING  Incomplete  Blood Culture ID Panel (Reflexed)     Status: Abnormal   Collection Time: 05/21/16 12:01 PM  Result Value Ref Range Status   Enterococcus species NOT DETECTED NOT DETECTED Final   Vancomycin resistance NOT DETECTED NOT DETECTED Final   Listeria monocytogenes NOT DETECTED NOT DETECTED Final   Staphylococcus species NOT DETECTED NOT DETECTED Final    Staphylococcus aureus NOT DETECTED NOT DETECTED Final   Methicillin resistance NOT DETECTED NOT DETECTED Final   Streptococcus species DETECTED (A) NOT DETECTED Final    Comment: CRITICAL RESULT CALLED TO, READ BACK BY AND VERIFIED WITH: NATE COOKSON ON 05/22/16 AT 0130 BY TLB    Streptococcus agalactiae DETECTED (A) NOT DETECTED Final    Comment: CRITICAL RESULT CALLED TO, READ BACK BY AND VERIFIED WITH: NATE COOKSON ON 05/22/16 AT 0130 BY TLB    Streptococcus pneumoniae NOT DETECTED NOT DETECTED Final   Streptococcus pyogenes NOT DETECTED NOT DETECTED Final   Acinetobacter baumannii NOT DETECTED NOT DETECTED Final   Enterobacteriaceae species NOT DETECTED NOT DETECTED Final   Enterobacter cloacae complex NOT DETECTED NOT DETECTED Final   Escherichia coli NOT DETECTED NOT DETECTED Final   Klebsiella oxytoca NOT DETECTED NOT DETECTED Final   Klebsiella pneumoniae NOT DETECTED NOT DETECTED Final   Proteus species NOT DETECTED NOT DETECTED Final   Serratia marcescens NOT DETECTED NOT DETECTED Final   Carbapenem resistance NOT DETECTED NOT DETECTED Final   Haemophilus influenzae NOT DETECTED NOT DETECTED Final   Neisseria meningitidis NOT DETECTED NOT DETECTED Final   Pseudomonas aeruginosa NOT DETECTED NOT DETECTED Final   Candida albicans NOT DETECTED NOT DETECTED Final   Candida glabrata NOT DETECTED NOT DETECTED Final   Candida krusei NOT DETECTED NOT DETECTED Final   Candida parapsilosis NOT DETECTED NOT DETECTED Final   Candida tropicalis NOT DETECTED NOT DETECTED Final  Body fluid culture     Status: None (Preliminary result)   Collection Time: 05/21/16 10:31 PM  Result Value Ref Range Status   Specimen Description PERITONEAL  Final   Special Requests Normal  Final   Gram Stain   Final    NO WBC SEEN NO ORGANISMS SEEN Performed at [facility]    Culture PENDING  Incomplete   Report Status PENDING  Incomplete    Coagulation Studies: No results for input(s):  LABPROT, INR in the last 72 hours.  Urinalysis:  Recent Labs  05/21/16 1420  COLORURINE YELLOW*  LABSPEC 1.013  PHURINE 5.0  GLUCOSEU NEGATIVE  HGBUR 2+*  BILIRUBINUR NEGATIVE  KETONESUR NEGATIVE  PROTEINUR 30*  NITRITE NEGATIVE  LEUKOCYTESUR 2+*      Imaging: Dg Chest 2 View  05/21/2016  CLINICAL DATA:  Hypoglycemia and hypothermia. EXAM: CHEST  2 VIEW COMPARISON:  05/11/2016 FINDINGS: The heart size and mediastinal contours are within normal limits. Both lungs are clear. The visualized skeletal structures are unremarkable. IMPRESSION: Normal chest x-ray. Electronically Signed   By: Marijo Sanes M.D.   On: 05/21/2016 12:57  Medications:     . allopurinol  100 mg Oral Daily  . amLODipine  10 mg Oral Q1500  . aspirin EC  325 mg Oral Daily  . atenolol  25 mg Oral Daily  .  ceFAZolin (ANCEF) IV  500 mg Intravenous Q12H  . cinacalcet  90 mg Oral Q lunch  . cloNIDine  0.3 mg Oral BID  . dialysis solution 1.5% low-MG/low-CA   Intraperitoneal Q24H  . gabapentin  100 mg Oral TID  . gentamicin cream  1 application Topical Daily  . heparin  5,000 Units Subcutaneous Q8H  . hydrALAZINE  50 mg Oral TID  . insulin aspart  0-5 Units Subcutaneous QHS  . insulin aspart  0-9 Units Subcutaneous TID WC  . insulin glargine  35 Units Subcutaneous q morning - 10a  . isosorbide mononitrate  30 mg Oral BID  . latanoprost  1 drop Both Eyes QHS  . losartan  100 mg Oral Daily  . pravastatin  80 mg Oral QHS  . predniSONE  40 mg Oral Q breakfast   And  . predniSONE  30 mg Oral Q breakfast   And  . [START ON 05/23/2016] predniSONE  20 mg Oral Q breakfast   And  . [START ON 05/24/2016] predniSONE  10 mg Oral Q breakfast  . sevelamer carbonate  2,400 mg Oral TID WC   acetaminophen **OR** acetaminophen, ALPRAZolam, amitriptyline, cyclobenzaprine, fluticasone, heparin, HYDROcodone-acetaminophen, metoCLOPramide, ondansetron **OR** ondansetron (ZOFRAN) IV  Assessment/ Plan:  75 y.o. female  end-stage renal disease, peritoneal dialysis, insulin-dependent diabetes, with complications of retinopathy, nephropathy, glaucoma, GERD, history of gout, hypertension, hyperlipidemia  1. End-stage renal disease We will continue her home doses regimen. Patient reports 1700 cc 6 exchanges  2. Secondary hyperparathyroidism Monitor phosphorus Continue sevelamer home dose  3. Anemia of chronic kidney disease We'll start her on Procrit Iron acceptable  4. Hypoglycemia, - Workup in progress to evaluate for infection Patient received empiric antibiotics in the emergency room PD fluid cell count and culture are neg for peritonitis    LOS: 1 Tavionna Grout 7/18/201712:48 PM

## 2016-05-22 NOTE — Progress Notes (Signed)
Peritoneal dialysis connected

## 2016-05-22 NOTE — Progress Notes (Signed)
Pre-peritoneal dialysis connection.

## 2016-05-22 NOTE — Progress Notes (Signed)
[facility]                                                                                                                                                                                            Patient Demographics   Marlean Snowberger, is a 75 y.o. female, DOB - 12/21/1940, [id number]  Admit date - 05/21/2016   Admitting Physician Henreitta Leber, MD  Outpatient Primary MD for the patient is [name], MD   LOS - 1  Subjective: Patient reports that she is feeling little better still very weak denies any chest pain or shortness of breath    Review of Systems:   CONSTITUTIONAL: No documented fever. Positive fatigue, positive weakness. No weight gain, no weight loss.  EYES: No blurry or double vision.  ENT: No tinnitus. No postnasal drip. No redness of the oropharynx.  RESPIRATORY: No cough, no wheeze, no hemoptysis. No dyspnea.  CARDIOVASCULAR: No chest pain. No orthopnea. No palpitations. No syncope.  GASTROINTESTINAL: No nausea, no vomiting or diarrhea. No abdominal pain. No melena or hematochezia.  GENITOURINARY: No dysuria or hematuria.  ENDOCRINE: No polyuria or nocturia. No heat or cold intolerance.  HEMATOLOGY: No anemia. No bruising. No bleeding.  INTEGUMENTARY: No rashes. No lesions.  MUSCULOSKELETAL: No arthritis. No swelling. No gout.  NEUROLOGIC: No numbness, tingling, or ataxia. No seizure-type activity.  PSYCHIATRIC: No anxiety. No insomnia. No ADD.    Vitals:   Filed Vitals:   05/21/16 2100 05/21/16 2140 05/22/16 0503 05/22/16 0845  BP: 120/46 142/55 106/84 142/35  Pulse: 87  78 72  Temp: 99 F (37.2 C)  98.2 F (36.8 C) 98.8 F (37.1 C)  TempSrc: Oral  Oral Oral  Resp: 20  20 18   Height:      Weight:      SpO2: 93%  93% 100%    Wt Readings from Last 3 Encounters:  05/21/16 97.5 kg (214 lb 15.2 oz)  05/15/16 96.163 kg (212 lb)  05/11/16 96.072 kg (211 lb 12.8 oz)     Intake/Output  Summary (Last 24 hours) at 05/22/16 1212 Last data filed at 05/22/16 0945  Gross per 24 hour  Intake    240 ml  Output   1100 ml  Net   -860 ml    Physical Exam:   GENERAL: Pleasant-appearing in no apparent distress.  HEAD, EYES, EARS, NOSE AND THROAT: Atraumatic, normocephalic. Extraocular muscles are intact. Pupils equal and reactive to light. Sclerae anicteric. No conjunctival injection. No oro-pharyngeal erythema.  NECK: Supple. There is no jugular venous distention. No bruits, no lymphadenopathy, no thyromegaly.  HEART:  Regular rate and rhythm,. No murmurs, no rubs, no clicks.  LUNGS: Clear to auscultation bilaterally. No rales or rhonchi. No wheezes.  ABDOMEN: Soft, flat, nontender, nondistended. Has good bowel sounds. No hepatosplenomegaly appreciated. Has a paratonia on dialysis and catheter without any erythema around it or drainage EXTREMITIES: No evidence of any cyanosis, clubbing, or peripheral edema.  +2 pedal and radial pulses bilaterally.  NEUROLOGIC: The patient is alert, awake, and oriented x3 with no focal motor or sensory deficits appreciated bilaterally.  SKIN: Moist and warm with no rashes appreciated.  Psych: Not anxious, depressed LN: No inguinal LN enlargement    Antibiotics   Anti-infectives    Start     Dose/Rate Route Frequency Ordered Stop   05/22/16 0145  ceFAZolin (ANCEF) 500 mg in dextrose 5 % 100 mL IVPB     500 mg 210 mL/hr over 30 Minutes Intravenous Every 12 hours 05/22/16 0138     05/21/16 1400  vancomycin (VANCOCIN) IVPB 1000 mg/200 mL premix     1,000 mg 200 mL/hr over 60 Minutes Intravenous  Once 05/21/16 1355 05/21/16 1524   05/21/16 1400  piperacillin-tazobactam (ZOSYN) IVPB 3.375 g     3.375 g 12.5 mL/hr over 240 Minutes Intravenous  Once 05/21/16 1355 05/21/16 1827      Medications   Scheduled Meds: . allopurinol  100 mg Oral Daily  . amLODipine  10 mg Oral Q1500  . aspirin EC  325 mg Oral Daily  . atenolol  25 mg Oral Daily  .   ceFAZolin (ANCEF) IV  500 mg Intravenous Q12H  . cinacalcet  90 mg Oral Q lunch  . cloNIDine  0.3 mg Oral BID  . dialysis solution 1.5% low-MG/low-CA   Intraperitoneal Q24H  . gabapentin  100 mg Oral TID  . gentamicin cream  1 application Topical Daily  . heparin  5,000 Units Subcutaneous Q8H  . hydrALAZINE  50 mg Oral TID  . insulin aspart  0-5 Units Subcutaneous QHS  . insulin aspart  0-9 Units Subcutaneous TID WC  . insulin glargine  35 Units Subcutaneous q morning - 10a  . isosorbide mononitrate  30 mg Oral BID  . latanoprost  1 drop Both Eyes QHS  . losartan  100 mg Oral Daily  . pravastatin  80 mg Oral QHS  . predniSONE  40 mg Oral Q breakfast   And  . predniSONE  30 mg Oral Q breakfast   And  . [START ON 05/23/2016] predniSONE  20 mg Oral Q breakfast   And  . [START ON 05/24/2016] predniSONE  10 mg Oral Q breakfast  . sevelamer carbonate  2,400 mg Oral TID WC   Continuous Infusions:  PRN Meds:.acetaminophen **OR** acetaminophen, ALPRAZolam, amitriptyline, cyclobenzaprine, fluticasone, heparin, HYDROcodone-acetaminophen, metoCLOPramide, ondansetron **OR** ondansetron (ZOFRAN) IV   Data Review:   Micro Results Recent Results (from the past 240 hour(s))  Surgical pcr screen     Status: None   Collection Time: 05/15/16  2:16 PM  Result Value Ref Range Status   MRSA, PCR NEGATIVE NEGATIVE Final   Staphylococcus aureus NEGATIVE NEGATIVE Final    Comment:        The Xpert SA Assay (FDA approved for NASAL specimens in patients over 5 years of age), is one component of a comprehensive surveillance program.  Test performance has been validated by [facility] for patients greater than or equal to 61 year old. It is not intended to diagnose infection nor to guide or monitor treatment.  Blood culture (routine x 2)     Status: None (Preliminary result)   Collection Time: 05/21/16 12:01 PM  Result Value Ref Range Status   Specimen Description   Final    BLOOD RIGHT  ANTECUBITAL Performed at [facility]  Final   Culture  Setup Time   Final    GRAM POSITIVE COCCI ANAEROBIC BOTTLE ONLY CRITICAL RESULT CALLED TO, READ BACK BY AND VERIFIED WITH: NATE COOKSON 05/22/16 0130 TLB/SGD    Culture GRAM POSITIVE COCCI  Final   Report Status PENDING  Incomplete  Blood culture (routine x 2)     Status: None (Preliminary result)   Collection Time: 05/21/16 12:01 PM  Result Value Ref Range Status   Specimen Description BLOOD LEFT HAND  Final   Special Requests BOTTLES DRAWN AEROBIC AND ANAEROBIC  [place]  Final   Culture  Setup Time   Final    GRAM POSITIVE COCCI ANAEROBIC BOTTLE ONLY CRITICAL RESULT CALLED TO, READ BACK BY AND VERIFIED WITH: [place] ON 05/22/16 AT 0130 BY TLB CONFIRMED BY TLB/[facility]    Culture   Final    GRAM POSITIVE COCCI ANAEROBIC BOTTLE ONLY IDENTIFICATION TO FOLLOW    Report Status PENDING  Incomplete  Blood Culture ID Panel (Reflexed)     Status: Abnormal   Collection Time: 05/21/16 12:01 PM  Result Value Ref Range Status   Enterococcus species NOT DETECTED NOT DETECTED Final   Vancomycin resistance NOT DETECTED NOT DETECTED Final   Listeria monocytogenes NOT DETECTED NOT DETECTED Final   Staphylococcus species NOT DETECTED NOT DETECTED Final   Staphylococcus aureus NOT DETECTED NOT DETECTED Final   Methicillin resistance NOT DETECTED NOT DETECTED Final   Streptococcus species DETECTED (A) NOT DETECTED Final    Comment: CRITICAL RESULT CALLED TO, READ BACK BY AND VERIFIED WITH: NATE COOKSON ON 05/22/16 AT 0130 BY TLB    Streptococcus agalactiae DETECTED (A) NOT DETECTED Final    Comment: CRITICAL RESULT CALLED TO, READ BACK BY AND VERIFIED WITH: NATE COOKSON ON 05/22/16 AT 0130 BY TLB    Streptococcus pneumoniae NOT DETECTED NOT DETECTED Final   Streptococcus pyogenes NOT DETECTED NOT DETECTED Final   Acinetobacter baumannii NOT DETECTED NOT DETECTED Final    Enterobacteriaceae species NOT DETECTED NOT DETECTED Final   Enterobacter cloacae complex NOT DETECTED NOT DETECTED Final   Escherichia coli NOT DETECTED NOT DETECTED Final   Klebsiella oxytoca NOT DETECTED NOT DETECTED Final   Klebsiella pneumoniae NOT DETECTED NOT DETECTED Final   Proteus species NOT DETECTED NOT DETECTED Final   Serratia marcescens NOT DETECTED NOT DETECTED Final   Carbapenem resistance NOT DETECTED NOT DETECTED Final   Haemophilus influenzae NOT DETECTED NOT DETECTED Final   Neisseria meningitidis NOT DETECTED NOT DETECTED Final   Pseudomonas aeruginosa NOT DETECTED NOT DETECTED Final   Candida albicans NOT DETECTED NOT DETECTED Final   Candida glabrata NOT DETECTED NOT DETECTED Final   Candida krusei NOT DETECTED NOT DETECTED Final   Candida parapsilosis NOT DETECTED NOT DETECTED Final   Candida tropicalis NOT DETECTED NOT DETECTED Final  Body fluid culture     Status: None (Preliminary result)   Collection Time: 05/21/16 10:31 PM  Result Value Ref Range Status   Specimen Description PERITONEAL  Final   Special Requests Normal  Final   Gram Stain   Final    NO WBC SEEN NO ORGANISMS SEEN Performed at [facility]  Culture PENDING  Incomplete   Report Status PENDING  Incomplete    Radiology Reports Dg Chest 2 View  05/21/2016  CLINICAL DATA:  Hypoglycemia and hypothermia. EXAM: CHEST  2 VIEW COMPARISON:  05/11/2016 FINDINGS: The heart size and mediastinal contours are within normal limits. Both lungs are clear. The visualized skeletal structures are unremarkable. IMPRESSION: Normal chest x-ray. Electronically Signed   By: Marijo Sanes M.D.   On: 05/21/2016 12:57   Dg Chest 2 View  05/11/2016  CLINICAL DATA:  Shortness of breath and chest pain developing while waiting for peritoneal dialysis today. EXAM: CHEST  2 VIEW COMPARISON:  Chest x-rays dated 04/27/2016 and 04/05/2016. Comparison also made to chest x-ray dated 07/31/2015. FINDINGS: Heart size is  upper normal, stable. Overall cardiomediastinal silhouette is stable in size and configuration. Lungs are clear. No pleural effusion or pneumothorax seen. Elevation of the right hemidiaphragm is stable. Osseous and soft tissue structures about the chest are otherwise unremarkable. IMPRESSION: No active cardiopulmonary disease. Electronically Signed   By: Franki Cabot M.D.   On: 05/11/2016 15:31   Dg Abd Acute W/chest  04/27/2016  CLINICAL DATA:  Dialysis catheter placement.  Bleeding. EXAM: DG ABDOMEN ACUTE W/ 1V CHEST COMPARISON:  04/05/2016. FINDINGS: Will mediastinum and hilar structures are unremarkable. Mild cardiomegaly with mild pulmonary vascular prominence. No focal alveolar infiltrate. No pleural effusion or pneumothorax. Surgical clips right upper quadrant. Peritoneal dialysis catheter noted coiled in the pelvis. Moderate amount of stool in the colon. Pelvic calcifications noted consistent phleboliths. Aortoiliac atherosclerotic vascular calcification. No acute bony abnormality. Lower lumbar laminectomy. IMPRESSION: 1.  Cardiomegaly with mild pulmonary vascular prominence. 2.  Peritoneal dialysis catheter noted coiled in the pelvis. Electronically Signed   By: Marcello Moores  Register   On: 04/27/2016 12:18     CBC  Recent Labs Lab 05/15/16 1416 05/21/16 1136 05/22/16 0444  WBC 11.0 12.9* 12.5*  HGB 7.5* 7.3* 6.9*  HCT 21.9* 21.8* 21.1*  PLT 189 201 186  MCV 103.7* 104.3* 104.4*  MCH 35.5* 34.9* 34.2*  MCHC 34.3 33.4 32.7  RDW 16.1* 16.7* 16.9*  LYMPHSABS 3.1 1.0  --   MONOABS 0.9 0.8  --   EOSABS 0.1 0.1  --   BASOSABS 0.0 0.0  --     Chemistries   Recent Labs Lab 05/15/16 1416 05/21/16 1136 05/22/16 0444  NA 136 138 137  K 4.0 3.7 4.6  CL 95* 97* 98*  CO2 22 23 23   GLUCOSE 97 105* 402*  BUN 106* 87* 96*  CREATININE 13.92* 13.30* 12.82*  CALCIUM 7.7* 7.8* 7.9*  AST  --  38  --   ALT  --  72*  --   ALKPHOS  --  92  --   BILITOT  --  0.4  --     ------------------------------------------------------------------------------------------------------------------ estimated creatinine clearance is 4.5 mL/min (by C-G formula based on Cr of 12.82). ------------------------------------------------------------------------------------------------------------------  Recent Labs  05/21/16 1136  HGBA1C 6.3*   ------------------------------------------------------------------------------------------------------------------ No results for input(s): CHOL, HDL, LDLCALC, TRIG, CHOLHDL, LDLDIRECT in the last 72 hours. ------------------------------------------------------------------------------------------------------------------  Recent Labs  05/21/16 1136  TSH 1.587   ------------------------------------------------------------------------------------------------------------------  Recent Labs  05/22/16 0444  FERRITIN 1709*  TIBC 248*  IRON 135    Coagulation profile  Recent Labs Lab 05/15/16 1416  INR 0.91    No results for input(s): DDIMER in the last 72 hours.  Cardiac Enzymes  Recent Labs Lab 05/21/16 1136  TROPONINI 0.06*   ------------------------------------------------------------------------------------------------------------------ Invalid input(s): POCBNP  Assessment & Plan   75 year old female with past history of diabetes insulin-dependent, end-stage renal disease on peritoneal dialysis, essential hypertension, glaucoma, history of gout, hyperlipidemia who presents to the hospital due to altered mental status and noted to be hypoglycemic and also hypothermic.  1.Acute encephalopathy due to due to combination of hypoglycemia and hypothermia now mental status improved    2. Hypoglycemia- suspect due to underlying infection. Patient's blood sugars now in the 400s was seen by diabetic educator recommended to start half dose of her home insulin. Which I will go ahead and order follow her blood sugars  closely    3. Hypothermia-due to sepsis. Blood cultures are showing gram positive cocci, will elevate official bacterial identification currently continued therapy With Ancef and vancomycin   4. End-stage renal disease on peritoneal dialysis-  await cultures from paratonia fluid Nephrologist following the patient   5. Essential hypertension-continue atenolol, amlodipine, clonidine, hydralazine, losartan. Blood pressure stable  6. Secondary hyperparathyroidism-continue Sensipar, Renvela.  7. Diabetic neuropathy-continue gabapentin.  8. Hyperlipidemia-continue Pravachol.  9. Acute gout flare Arty on allopurinol which will continue patient Arty started on prednisone taper    Code Status Orders        Start     Ordered   05/21/16 1629  Full code   Continuous     05/21/16 1628    Code Status History    Date Active Date Inactive Code Status Order ID Comments User Context   04/05/2016  4:14 PM 04/06/2016  9:07 PM Full Code [id number]  Loletha Grayer, MD ED   03/14/2016 11:50 PM 03/16/2016  3:34 PM Full Code [id number]  Max Sane, MD Inpatient           Consults Nephrology  DVT Prophylaxis heparin  Lab Results  Component Value Date   PLT 186 05/22/2016     Time Spent in minutes   105min Greater than 50% of time spent in care coordination and counseling patient regarding the condition and plan of care.   Dustin Flock M.D on 05/22/2016 at 12:12 PM  Between 7am to 6pm - Pager - [phone]  After 6pm go to [email or web address] - password [email or web address] [facility]   Office  [phone]

## 2016-05-22 NOTE — Progress Notes (Signed)
Disconnected from peritoneal cycler

## 2016-05-23 LAB — BASIC METABOLIC PANEL
ANION GAP: 16 — AB (ref 5–15)
BUN: 98 mg/dL — AB (ref 6–20)
CO2: 24 mmol/L (ref 22–32)
Calcium: 8 mg/dL — ABNORMAL LOW (ref 8.9–10.3)
Chloride: 97 mmol/L — ABNORMAL LOW (ref 101–111)
Creatinine, Ser: 12.27 mg/dL — ABNORMAL HIGH (ref 0.44–1.00)
GFR calc Af Amer: 3 mL/min — ABNORMAL LOW (ref >60)
GFR calc non Af Amer: 3 mL/min — ABNORMAL LOW (ref >60)
GLUCOSE: 187 mg/dL — AB (ref 65–99)
POTASSIUM: 3.8 mmol/L (ref 3.5–5.1)
Sodium: 137 mmol/L (ref 135–145)

## 2016-05-23 LAB — CBC
HEMATOCRIT: 17.9 % — AB (ref 35.0–47.0)
Hemoglobin: 6.1 g/dL — ABNORMAL LOW (ref 12.0–16.0)
MCH: 35.3 pg — AB (ref 26.0–34.0)
MCHC: 33.9 g/dL (ref 32.0–36.0)
MCV: 104.2 fL — AB (ref 80.0–100.0)
Platelets: 168 10*3/uL (ref 150–440)
RBC: 1.72 MIL/uL — AB (ref 3.80–5.20)
RDW: 16.5 % — AB (ref 11.5–14.5)
WBC: 13.5 10*3/uL — AB (ref 3.6–11.0)

## 2016-05-23 LAB — GLUCOSE, CAPILLARY
GLUCOSE-CAPILLARY: 269 mg/dL — AB (ref 65–99)
Glucose-Capillary: 162 mg/dL — ABNORMAL HIGH (ref 65–99)
Glucose-Capillary: 192 mg/dL — ABNORMAL HIGH (ref 65–99)
Glucose-Capillary: 296 mg/dL — ABNORMAL HIGH (ref 65–99)

## 2016-05-23 LAB — ABO/RH: ABO/RH(D): O POS

## 2016-05-23 LAB — PREPARE RBC (CROSSMATCH)

## 2016-05-23 LAB — HEPATITIS B SURFACE ANTIGEN: HEP B S AG: NEGATIVE

## 2016-05-23 MED ORDER — SODIUM CHLORIDE 0.9 % IV SOLN
Freq: Once | INTRAVENOUS | Status: AC
  Administered 2016-05-23: 12:00:00 via INTRAVENOUS

## 2016-05-23 NOTE — Progress Notes (Signed)
Patient connected to peritoneal dialysis cycler.

## 2016-05-23 NOTE — Progress Notes (Signed)
Subjective:   Patient is known to our practice from outpatient dialysis She is followed by Dr. Holley Raring for peritoneal dialysis She presents for hypoglycemia Tolerated CCPD well overnight. No problems reported. Patient is very lethargic today but able to answer a few questions.  Hemoglobin is down to 6.1.  No complaints of shortness of breath. No nausea or vomiting.     Objective:  Vital signs in last 24 hours:  Temp:  [97.9 F (36.6 C)-99.3 F (37.4 C)] 97.9 F (36.6 C) (07/19 0931) Pulse Rate:  [61-81] 61 (07/19 1013) Resp:  [17-19] 17 (07/19 0931) BP: (106-186)/(42-84) 126/56 mmHg (07/19 1013) SpO2:  [94 %-100 %] 99 % (07/19 1013) Weight:  [102.604 kg (226 lb 3.2 oz)] 102.604 kg (226 lb 3.2 oz) (07/18 1301)  Weight change: 5.104 kg (11 lb 4 oz) Filed Weights   05/21/16 1131 05/22/16 1301  Weight: 97.5 kg (214 lb 15.2 oz) 102.604 kg (226 lb 3.2 oz)    Intake/Output:    Intake/Output Summary (Last 24 hours) at 05/23/16 1057 Last data filed at 05/23/16 1024  Gross per 24 hour  Intake    320 ml  Output    925 ml  Net   -605 ml     Physical Exam: General: No acute distress, lethargic and laying in bed.  HEENT Anicteric, moist mucous membranes   Neck Supple   Pulm/lungs Lungs are clear to auscultation bilaterally, normal breathing effort   CVS/Heart Regular rhythm, no rub or gallop   Abdomen:  Soft, nontender, exit site nontender   Extremities: Trace peripheral edema   Neurologic: Lethargic  Skin: No acute rashes   Access: PD catheter        Basic Metabolic Panel:   Recent Labs Lab 05/21/16 1136 05/22/16 0444 05/23/16 0528  NA 138 137 137  K 3.7 4.6 3.8  CL 97* 98* 97*  CO2 23 23 24   GLUCOSE 105* 402* 187*  BUN 87* 96* 98*  CREATININE 13.30* 12.82* 12.27*  CALCIUM 7.8* 7.9* 8.0*     CBC:  Recent Labs Lab 05/21/16 1136 05/22/16 0444 05/23/16 0528  WBC 12.9* 12.5* 13.5*  NEUTROABS 11.0*  --   --   HGB 7.3* 6.9* 6.1*  HCT 21.8* 21.1*  17.9*  MCV 104.3* 104.4* 104.2*  PLT 201 186 168      Microbiology:  Recent Results (from the past 720 hour(s))  Surgical pcr screen     Status: None   Collection Time: 05/15/16  2:16 PM  Result Value Ref Range Status   MRSA, PCR NEGATIVE NEGATIVE Final   Staphylococcus aureus NEGATIVE NEGATIVE Final    Comment:        The Xpert SA Assay (FDA approved for NASAL specimens in patients over 48 years of age), is one component of a comprehensive surveillance program.  Test performance has been validated by [facility] for patients greater than or equal to 25 year old. It is not intended to diagnose infection nor to guide or monitor treatment.   Blood culture (routine x 2)     Status: Abnormal (Preliminary result)   Collection Time: 05/21/16 12:01 PM  Result Value Ref Range Status   Specimen Description BLOOD RIGHT ANTECUBITAL  Final   Special Requests BOTTLES DRAWN AEROBIC AND ANAEROBIC 4CC  Final   Culture  Setup Time   Final    GRAM POSITIVE COCCI ANAEROBIC BOTTLE ONLY CRITICAL RESULT CALLED TO, READ BACK BY AND VERIFIED WITH: NATE COOKSON 05/22/16 0130 TLB/SGD  Culture (A)  Final    GROUP B STREP(S.AGALACTIAE)ISOLATED Virtually 100% of S. agalactiae (Group B) strains are susceptible to Penicillin.  For Penicillin-allergic patients, Erythromycin (85-95% sensitive) and Clindamycin (80% sensitive) are drugs of choice. Contact microbiology lab to request sensitivities if  needed within 7 days. Performed at [facility]    Report Status PENDING  Incomplete  Blood culture (routine x 2)     Status: Abnormal (Preliminary result)   Collection Time: 05/21/16 12:01 PM  Result Value Ref Range Status   Specimen Description BLOOD LEFT HAND  Final   Special Requests BOTTLES DRAWN AEROBIC AND ANAEROBIC  [place]  Final   Culture  Setup Time   Final    GRAM POSITIVE COCCI ANAEROBIC BOTTLE ONLY CRITICAL RESULT CALLED TO, READ BACK BY AND VERIFIED WITH: NATE COOKSON ON 05/22/16 AT  0130 BY TLB CONFIRMED BY TLB/[facility]    Culture (A)  Final    GROUP B STREP(S.AGALACTIAE)ISOLATED SUSCEPTIBILITIES TO FOLLOW Virtually 100% of S. agalactiae (Group B) strains are susceptible to Penicillin.  For Penicillin-allergic patients, Erythromycin (85-95% sensitive) and Clindamycin (80% sensitive) are drugs of choice. Contact microbiology lab to request sensitivities if  needed within 7 days. Performed at [facility]    Report Status PENDING  Incomplete  Blood Culture ID Panel (Reflexed)     Status: Abnormal   Collection Time: 05/21/16 12:01 PM  Result Value Ref Range Status   Enterococcus species NOT DETECTED NOT DETECTED Final   Vancomycin resistance NOT DETECTED NOT DETECTED Final   Listeria monocytogenes NOT DETECTED NOT DETECTED Final   Staphylococcus species NOT DETECTED NOT DETECTED Final   Staphylococcus aureus NOT DETECTED NOT DETECTED Final   Methicillin resistance NOT DETECTED NOT DETECTED Final   Streptococcus species DETECTED (A) NOT DETECTED Final    Comment: CRITICAL RESULT CALLED TO, READ BACK BY AND VERIFIED WITH: NATE COOKSON ON 05/22/16 AT 0130 BY TLB    Streptococcus agalactiae DETECTED (A) NOT DETECTED Final    Comment: CRITICAL RESULT CALLED TO, READ BACK BY AND VERIFIED WITH: NATE COOKSON ON 05/22/16 AT 0130 BY TLB    Streptococcus pneumoniae NOT DETECTED NOT DETECTED Final   Streptococcus pyogenes NOT DETECTED NOT DETECTED Final   Acinetobacter baumannii NOT DETECTED NOT DETECTED Final   Enterobacteriaceae species NOT DETECTED NOT DETECTED Final   Enterobacter cloacae complex NOT DETECTED NOT DETECTED Final   Escherichia coli NOT DETECTED NOT DETECTED Final   Klebsiella oxytoca NOT DETECTED NOT DETECTED Final   Klebsiella pneumoniae NOT DETECTED NOT DETECTED Final   Proteus species NOT DETECTED NOT DETECTED Final   Serratia marcescens NOT DETECTED NOT DETECTED Final   Carbapenem resistance NOT DETECTED NOT DETECTED Final   Haemophilus influenzae  NOT DETECTED NOT DETECTED Final   Neisseria meningitidis NOT DETECTED NOT DETECTED Final   Pseudomonas aeruginosa NOT DETECTED NOT DETECTED Final   Candida albicans NOT DETECTED NOT DETECTED Final   Candida glabrata NOT DETECTED NOT DETECTED Final   Candida krusei NOT DETECTED NOT DETECTED Final   Candida parapsilosis NOT DETECTED NOT DETECTED Final   Candida tropicalis NOT DETECTED NOT DETECTED Final  Body fluid culture     Status: None (Preliminary result)   Collection Time: 05/21/16 10:31 PM  Result Value Ref Range Status   Specimen Description PERITONEAL  Final   Special Requests Normal  Final   Gram Stain   Final    NO WBC SEEN NO ORGANISMS SEEN Performed at [facility]    Culture PENDING  Incomplete   Report Status PENDING  Incomplete    Coagulation Studies: No results for input(s): LABPROT, INR in the last 72 hours.  Urinalysis:  Recent Labs  05/21/16 1420  COLORURINE YELLOW*  LABSPEC 1.013  PHURINE 5.0  GLUCOSEU NEGATIVE  HGBUR 2+*  BILIRUBINUR NEGATIVE  KETONESUR NEGATIVE  PROTEINUR 30*  NITRITE NEGATIVE  LEUKOCYTESUR 2+*      Imaging: Dg Chest 2 View  05/21/2016  CLINICAL DATA:  Hypoglycemia and hypothermia. EXAM: CHEST  2 VIEW COMPARISON:  05/11/2016 FINDINGS: The heart size and mediastinal contours are within normal limits. Both lungs are clear. The visualized skeletal structures are unremarkable. IMPRESSION: Normal chest x-ray. Electronically Signed   By: Marijo Sanes M.D.   On: 05/21/2016 12:57     Medications:     . allopurinol  100 mg Oral Daily  . amLODipine  10 mg Oral Q1500  . aspirin EC  325 mg Oral Daily  . atenolol  25 mg Oral Daily  .  ceFAZolin (ANCEF) IV  500 mg Intravenous Q12H  . cinacalcet  90 mg Oral Q lunch  . cloNIDine  0.3 mg Oral BID  . dialysis solution 1.5% low-MG/low-CA   Intraperitoneal Q24H  . epoetin (EPOGEN/PROCRIT) injection  20,000 Units Subcutaneous Weekly  . gabapentin  100 mg Oral TID  . gentamicin  cream  1 application Topical Daily  . heparin  5,000 Units Subcutaneous Q8H  . hydrALAZINE  50 mg Oral TID  . insulin aspart  0-5 Units Subcutaneous QHS  . insulin aspart  0-9 Units Subcutaneous TID WC  . insulin glargine  35 Units Subcutaneous q morning - 10a  . isosorbide mononitrate  30 mg Oral BID  . latanoprost  1 drop Both Eyes QHS  . losartan  100 mg Oral Daily  . pravastatin  80 mg Oral QHS  . predniSONE  40 mg Oral Q breakfast   And  . predniSONE  30 mg Oral Q breakfast   And  . predniSONE  20 mg Oral Q breakfast   And  . [START ON 05/24/2016] predniSONE  10 mg Oral Q breakfast  . sevelamer carbonate  2,400 mg Oral TID WC   acetaminophen **OR** acetaminophen, ALPRAZolam, amitriptyline, cyclobenzaprine, fluticasone, heparin, HYDROcodone-acetaminophen, metoCLOPramide, ondansetron **OR** ondansetron (ZOFRAN) IV  Assessment/ Plan:  75 y.o. female end-stage renal disease, peritoneal dialysis, insulin-dependent diabetes, with complications of retinopathy, nephropathy, glaucoma, GERD, history of gout, hypertension, hyperlipidemia  1. End-stage renal disease We will continue her home doses regimen. Patient reports 1700 cc 6 exchanges  2. Secondary hyperparathyroidism Monitor phosphorus Continue sevelamer home dose  3. Anemia of chronic kidney disease We'll start her on Procrit Iron acceptable Check stool for guac. If positive may need GI work-up. No evidence of active bleed.  4. Hypoglycemia, - Workup in progress to evaluate for infection Patient received empiric antibiotics in the emergency room PD fluid cell count and culture are neg for peritonitis  5. Lethargy - Recommend to discontinue psychoactive medications including Xanax.    LOS: 2 Laurie Steele 7/19/201710:57 AM

## 2016-05-23 NOTE — Progress Notes (Signed)
Disconnected from peritoneal cycler

## 2016-05-23 NOTE — Progress Notes (Signed)
Dr. Marcille Blanco notified of pt HGB 6.1. MD will pass this information on to nephrologist. Will continue to monitor.

## 2016-05-23 NOTE — Progress Notes (Signed)
[place] [facility] [place] NAME: Laurie Steele    MR#:  [id number]  DATE OF BIRTH:  [birth year]  SUBJECTIVE:  Pt. Here due to hypoglycemia, hypothermia due to sepsis.  BC + for Group B Strep.  Lethargic but follows commands.  Hg. Down to 6.1 today. No other complaints.   REVIEW OF SYSTEMS:    Review of Systems  Constitutional: Negative for fever and chills.  HENT: Negative for congestion and tinnitus.   Eyes: Negative for blurred vision and double vision.  Respiratory: Negative for cough, shortness of breath and wheezing.   Cardiovascular: Negative for chest pain, orthopnea and PND.  Gastrointestinal: Negative for nausea, vomiting, abdominal pain and diarrhea.  Genitourinary: Negative for dysuria and hematuria.  Neurological: Positive for weakness (generalized). Negative for dizziness, sensory change and focal weakness.  All other systems reviewed and are negative.   Nutrition: Renal/carb modified Tolerating Diet: Yes Tolerating PT: Await Eval.   DRUG ALLERGIES:  No Known Allergies  VITALS:  Blood pressure 126/56, pulse 61, temperature 97.9 F (36.6 C), temperature source Oral, resp. rate 17, height 5\' 7"  (1.702 m), weight 103.284 kg (227 lb 11.2 oz), SpO2 99 %.  PHYSICAL EXAMINATION:   Physical Exam  GENERAL:  75 y.o.-year-old patient lethargic lying in the bed in no acute distress.  EYES: Pupils equal, round, reactive to light and accommodation. No scleral icterus. Extraocular muscles intact.  HEENT: Head atraumatic, normocephalic. Oropharynx and nasopharynx clear.  NECK:  Supple, no jugular venous distention. No thyroid enlargement, no tenderness.  LUNGS: Normal breath sounds bilaterally, no wheezing, rales, rhonchi. No use of accessory muscles of respiration.  CARDIOVASCULAR: S1, S2 normal. No murmurs, rubs, or gallops.  ABDOMEN: Soft, nontender, nondistended. Bowel sounds present. No organomegaly or mass. +PD cath in place w/out  acute drainage.  EXTREMITIES: No cyanosis, clubbing or edema b/l.    NEUROLOGIC: Cranial nerves II through XII are intact. No focal Motor or sensory deficits b/l.  Globally weak PSYCHIATRIC: The patient is alert and oriented x 3.  SKIN: No obvious rash, lesion, or ulcer.    LABORATORY PANEL:   CBC  Recent Labs Lab 05/23/16 0528  WBC 13.5*  HGB 6.1*  HCT 17.9*  PLT 168   ------------------------------------------------------------------------------------------------------------------  Chemistries   Recent Labs Lab 05/21/16 1136  05/23/16 0528  NA 138  < > 137  K 3.7  < > 3.8  CL 97*  < > 97*  CO2 23  < > 24  GLUCOSE 105*  < > 187*  BUN 87*  < > 98*  CREATININE 13.30*  < > 12.27*  CALCIUM 7.8*  < > 8.0*  AST 38  --   --   ALT 72*  --   --   ALKPHOS 92  --   --   BILITOT 0.4  --   --   < > = values in this interval not displayed. ------------------------------------------------------------------------------------------------------------------  Cardiac Enzymes  Recent Labs Lab 05/21/16 1136  TROPONINI 0.06*   ------------------------------------------------------------------------------------------------------------------  RADIOLOGY:  No results found.   ASSESSMENT AND PLAN:   75 year old female with past history of diabetes insulin-dependent, end-stage renal disease on peritoneal dialysis, essential hypertension, glaucoma, history of gout, hyperlipidemia who presents to the hospital due to altered mental status and noted to be hypoglycemic and also hypothermic.  1. Altered mental status-metabolic encephalopathy secondary to the hypothermia and hypoglycemia. -Hypoglycemia, hypothermia and now resolved. Mental status improved but still remains somewhat lethargic.  2. Hypoglycemia-this is  likely secondary to sepsis. Now resolved. -Now more hyperglycemic. Continue Lantus, sliding scale insulin for now.  3. Hypothermia-this is secondary to sepsis. Now  resolved. -Blood cultures positive for group B strep. The source is unclear. Continue IV Ancef. -Chest x-ray, urinalysis and peritoneal fluid analysis on negative for any acute infection.  4. Sepsis-patient's blood cultures positive for group B strep. Source unclear. -Continue IV Ancef. We'll discuss with infectious disease over the phone.  5. Anemia of chronic disease-patient's hemoglobin down to 6.1 today. Likely secondary to end-stage renal disease. -We'll check Hemoccults. Transfuse 1 unit of packed red blood cells. Repeat hemoglobin the morning.  6. End-stage renal disease on peritoneal dialysis-nephrology has been consulted. Continue care as per them. -Culture peritoneal fluid.  7. Essential hypertension-continue atenolol, amlodipine, clonidine, hydralazine, losartan.  8. Secondary hyperparathyroidism-continue Sensipar, Renvela.  9. Diabetic neuropathy-continue gabapentin.  10. Hyperlipidemia-continue Pravachol.  11. History of gout-no acute attack. Continue allopurinol.   All the records are reviewed and case discussed with Care Management/Social Workerr. Management plans discussed with the patient, family and they are in agreement.  CODE STATUS: Full  DVT Prophylaxis: Hep. SQ  TOTAL TIME TAKING CARE OF THIS PATIENT: 30 minutes.   POSSIBLE D/C IN 2-3 DAYS, DEPENDING ON CLINICAL CONDITION.   Henreitta Leber M.D on 05/23/2016 at 2:36 PM  Between 7am to 6pm - Pager - [phone]  After 6pm go to [email or web address] - password [email or web address] [facility]  Office  [phone]  CC: Primary care physician; [name], MD

## 2016-05-24 LAB — TYPE AND SCREEN
ABO/RH(D): O POS
ANTIBODY SCREEN: NEGATIVE
Unit division: 0

## 2016-05-24 LAB — CBC
HEMATOCRIT: 22.9 % — AB (ref 35.0–47.0)
HEMOGLOBIN: 7.7 g/dL — AB (ref 12.0–16.0)
MCH: 33.1 pg (ref 26.0–34.0)
MCHC: 33.7 g/dL (ref 32.0–36.0)
MCV: 98.4 fL (ref 80.0–100.0)
Platelets: 179 10*3/uL (ref 150–440)
RBC: 2.32 MIL/uL — ABNORMAL LOW (ref 3.80–5.20)
RDW: 21.9 % — ABNORMAL HIGH (ref 11.5–14.5)
WBC: 15 10*3/uL — ABNORMAL HIGH (ref 3.6–11.0)

## 2016-05-24 LAB — GLUCOSE, CAPILLARY
GLUCOSE-CAPILLARY: 191 mg/dL — AB (ref 65–99)
GLUCOSE-CAPILLARY: 226 mg/dL — AB (ref 65–99)
GLUCOSE-CAPILLARY: 305 mg/dL — AB (ref 65–99)
Glucose-Capillary: 236 mg/dL — ABNORMAL HIGH (ref 65–99)

## 2016-05-24 LAB — CULTURE, BLOOD (ROUTINE X 2)

## 2016-05-24 LAB — OCCULT BLOOD X 1 CARD TO LAB, STOOL: Fecal Occult Bld: NEGATIVE

## 2016-05-24 MED ORDER — INSULIN GLARGINE 100 UNIT/ML ~~LOC~~ SOLN
42.0000 [IU] | Freq: Every morning | SUBCUTANEOUS | Status: DC
  Administered 2016-05-25 – 2016-05-28 (×4): 42 [IU] via SUBCUTANEOUS
  Filled 2016-05-24 (×5): qty 0.42

## 2016-05-24 NOTE — Progress Notes (Signed)
Notified Dr. Verdell Carmine that patient has been experencing pain in the right arm for over a month now. No new orders received will continue to monitor.

## 2016-05-24 NOTE — Progress Notes (Signed)
PD tx started

## 2016-05-24 NOTE — Care Management (Signed)
Chart reviewed. Currently PD dialysis, [facility]. PCP is Dr. Ginette Pitman. PT consult pending.

## 2016-05-24 NOTE — Progress Notes (Signed)
Subjective:   Patient is more alert today. She is sitting up and eating breakfast when seen. No nausea of vomiting. No SOB.  Tolerated CCPD okay with no problems.  Hemoglobin improved after blood transfusion.    Objective:  Vital signs in last 24 hours:  Temp:  [97.7 F (36.5 C)-98.4 F (36.9 C)] 98.4 F (36.9 C) (07/20 0550) Pulse Rate:  [61-75] 68 (07/20 0550) Resp:  [17-20] 17 (07/20 0550) BP: (106-154)/(42-72) 154/58 mmHg (07/20 0550) SpO2:  [96 %-100 %] 98 % (07/20 0550) Weight:  [100.608 kg (221 lb 12.8 oz)-103.284 kg (227 lb 11.2 oz)] 100.608 kg (221 lb 12.8 oz) (07/20 0616)  Weight change: 0.68 kg (1 lb 8 oz) Filed Weights   05/22/16 1301 05/23/16 1239 05/24/16 0616  Weight: 102.604 kg (226 lb 3.2 oz) 103.284 kg (227 lb 11.2 oz) 100.608 kg (221 lb 12.8 oz)    Intake/Output:    Intake/Output Summary (Last 24 hours) at 05/24/16 0859 Last data filed at 05/24/16 0756  Gross per 24 hour  Intake    941 ml  Output    525 ml  Net    416 ml     Physical Exam: General: No acute distress, sitting in bed.  HEENT Anicteric, moist mucous membranes   Neck Supple   Pulm/lungs Lungs are clear to auscultation bilaterally, normal breathing effort   CVS/Heart Regular rhythm, no rub or gallop   Abdomen:  Soft, nontender, exit site nontender   Extremities: Trace peripheral edema   Neurologic: Alert and oriented  Skin: No acute rashes   Access: PD catheter        Basic Metabolic Panel:   Recent Labs Lab 05/21/16 1136 05/22/16 0444 05/23/16 0528  NA 138 137 137  K 3.7 4.6 3.8  CL 97* 98* 97*  CO2 23 23 24   GLUCOSE 105* 402* 187*  BUN 87* 96* 98*  CREATININE 13.30* 12.82* 12.27*  CALCIUM 7.8* 7.9* 8.0*     CBC:  Recent Labs Lab 05/21/16 1136 05/22/16 0444 05/23/16 0528 05/24/16 0435  WBC 12.9* 12.5* 13.5* 15.0*  NEUTROABS 11.0*  --   --   --   HGB 7.3* 6.9* 6.1* 7.7*  HCT 21.8* 21.1* 17.9* 22.9*  MCV 104.3* 104.4* 104.2* 98.4  PLT 201 186 168 179       Microbiology:  Recent Results (from the past 720 hour(s))  Surgical pcr screen     Status: None   Collection Time: 05/15/16  2:16 PM  Result Value Ref Range Status   MRSA, PCR NEGATIVE NEGATIVE Final   Staphylococcus aureus NEGATIVE NEGATIVE Final    Comment:        The Xpert SA Assay (FDA approved for NASAL specimens in patients over 55 years of age), is one component of a comprehensive surveillance program.  Test performance has been validated by [facility] for patients greater than or equal to 33 year old. It is not intended to diagnose infection nor to guide or monitor treatment.   Blood culture (routine x 2)     Status: Abnormal (Preliminary result)   Collection Time: 05/21/16 12:01 PM  Result Value Ref Range Status   Specimen Description BLOOD RIGHT ANTECUBITAL  Final   Special Requests BOTTLES DRAWN AEROBIC AND ANAEROBIC 4CC  Final   Culture  Setup Time   Final    GRAM POSITIVE COCCI ANAEROBIC BOTTLE ONLY CRITICAL RESULT CALLED TO, READ BACK BY AND VERIFIED WITH: NATE COOKSON 05/22/16 0130 TLB/SGD    Culture (A)  Final    GROUP B STREP(S.AGALACTIAE)ISOLATED Virtually 100% of S. agalactiae (Group B) strains are susceptible to Penicillin.  For Penicillin-allergic patients, Erythromycin (85-95% sensitive) and Clindamycin (80% sensitive) are drugs of choice. Contact microbiology lab to request sensitivities if  needed within 7 days. Performed at [facility]    Report Status PENDING  Incomplete  Blood culture (routine x 2)     Status: Abnormal (Preliminary result)   Collection Time: 05/21/16 12:01 PM  Result Value Ref Range Status   Specimen Description BLOOD LEFT HAND  Final   Special Requests BOTTLES DRAWN AEROBIC AND ANAEROBIC  [place]  Final   Culture  Setup Time   Final    GRAM POSITIVE COCCI ANAEROBIC BOTTLE ONLY CRITICAL RESULT CALLED TO, READ BACK BY AND VERIFIED WITH: NATE COOKSON ON 05/22/16 AT 0130 BY TLB CONFIRMED BY TLB/KBH    Culture (A)   Final    GROUP B STREP(S.AGALACTIAE)ISOLATED SUSCEPTIBILITIES TO FOLLOW Virtually 100% of S. agalactiae (Group B) strains are susceptible to Penicillin.  For Penicillin-allergic patients, Erythromycin (85-95% sensitive) and Clindamycin (80% sensitive) are drugs of choice. Contact microbiology lab to request sensitivities if  needed within 7 days. Performed at [facility]    Report Status PENDING  Incomplete  Blood Culture ID Panel (Reflexed)     Status: Abnormal   Collection Time: 05/21/16 12:01 PM  Result Value Ref Range Status   Enterococcus species NOT DETECTED NOT DETECTED Final   Vancomycin resistance NOT DETECTED NOT DETECTED Final   Listeria monocytogenes NOT DETECTED NOT DETECTED Final   Staphylococcus species NOT DETECTED NOT DETECTED Final   Staphylococcus aureus NOT DETECTED NOT DETECTED Final   Methicillin resistance NOT DETECTED NOT DETECTED Final   Streptococcus species DETECTED (A) NOT DETECTED Final    Comment: CRITICAL RESULT CALLED TO, READ BACK BY AND VERIFIED WITH: NATE COOKSON ON 05/22/16 AT 0130 BY TLB    Streptococcus agalactiae DETECTED (A) NOT DETECTED Final    Comment: CRITICAL RESULT CALLED TO, READ BACK BY AND VERIFIED WITH: NATE COOKSON ON 05/22/16 AT 0130 BY TLB    Streptococcus pneumoniae NOT DETECTED NOT DETECTED Final   Streptococcus pyogenes NOT DETECTED NOT DETECTED Final   Acinetobacter baumannii NOT DETECTED NOT DETECTED Final   Enterobacteriaceae species NOT DETECTED NOT DETECTED Final   Enterobacter cloacae complex NOT DETECTED NOT DETECTED Final   Escherichia coli NOT DETECTED NOT DETECTED Final   Klebsiella oxytoca NOT DETECTED NOT DETECTED Final   Klebsiella pneumoniae NOT DETECTED NOT DETECTED Final   Proteus species NOT DETECTED NOT DETECTED Final   Serratia marcescens NOT DETECTED NOT DETECTED Final   Carbapenem resistance NOT DETECTED NOT DETECTED Final   Haemophilus influenzae NOT DETECTED NOT DETECTED Final   Neisseria  meningitidis NOT DETECTED NOT DETECTED Final   Pseudomonas aeruginosa NOT DETECTED NOT DETECTED Final   Candida albicans NOT DETECTED NOT DETECTED Final   Candida glabrata NOT DETECTED NOT DETECTED Final   Candida krusei NOT DETECTED NOT DETECTED Final   Candida parapsilosis NOT DETECTED NOT DETECTED Final   Candida tropicalis NOT DETECTED NOT DETECTED Final  Body fluid culture     Status: None (Preliminary result)   Collection Time: 05/21/16 10:31 PM  Result Value Ref Range Status   Specimen Description PERITONEAL  Final   Special Requests Normal  Final   Gram Stain NO WBC SEEN NO ORGANISMS SEEN   Final   Culture   Final    NO GROWTH 1 DAY Performed at [facility]    Report Status PENDING  Incomplete    Coagulation Studies: No results for input(s): LABPROT, INR in the last 72 hours.  Urinalysis:  Recent Labs  05/21/16 1420  COLORURINE YELLOW*  LABSPEC 1.013  PHURINE 5.0  GLUCOSEU NEGATIVE  HGBUR 2+*  BILIRUBINUR NEGATIVE  KETONESUR NEGATIVE  PROTEINUR 30*  NITRITE NEGATIVE  LEUKOCYTESUR 2+*      Imaging: No results found.   Medications:     . allopurinol  100 mg Oral Daily  . aspirin EC  325 mg Oral Daily  . atenolol  25 mg Oral Daily  .  ceFAZolin (ANCEF) IV  500 mg Intravenous Q12H  . cinacalcet  90 mg Oral Q lunch  . dialysis solution 1.5% low-MG/low-CA   Intraperitoneal Q24H  . epoetin (EPOGEN/PROCRIT) injection  20,000 Units Subcutaneous Weekly  . gabapentin  100 mg Oral TID  . gentamicin cream  1 application Topical Daily  . heparin  5,000 Units Subcutaneous Q8H  . insulin aspart  0-5 Units Subcutaneous QHS  . insulin aspart  0-9 Units Subcutaneous TID WC  . insulin glargine  35 Units Subcutaneous q morning - 10a  . isosorbide mononitrate  30 mg Oral BID  . latanoprost  1 drop Both Eyes QHS  . losartan  100 mg Oral Daily  . pravastatin  80 mg Oral QHS  . predniSONE  40 mg Oral Q breakfast   And  . predniSONE  30 mg Oral Q breakfast    And  . predniSONE  20 mg Oral Q breakfast   And  . predniSONE  10 mg Oral Q breakfast  . sevelamer carbonate  2,400 mg Oral TID WC   acetaminophen **OR** acetaminophen, ALPRAZolam, amitriptyline, cyclobenzaprine, fluticasone, heparin, HYDROcodone-acetaminophen, metoCLOPramide, ondansetron **OR** ondansetron (ZOFRAN) IV  Assessment/ Plan:  75 y.o. female end-stage renal disease, peritoneal dialysis, insulin-dependent diabetes, with complications of retinopathy, nephropathy, glaucoma, GERD, history of gout, hypertension, hyperlipidemia  1. End-stage renal disease We will continue her home doses regimen. Patient reports 1700 cc 6 exchanges  2. Secondary hyperparathyroidism Monitor phosphorus Continue sevelamer home dose  3. Anemia of chronic kidney disease We'll start her on Procrit Iron acceptable Check stool for guaIc. If positive may need GI work-up. No evidence of active bleed.  4. Hypoglycemia, - Workup revealed Group B Strep bacteremia Patient received Cefazolin for treatment. PD fluid cell count and culture are neg for peritonitis   LOS: 3 Elsa Ploch 7/20/20178:59 AM

## 2016-05-24 NOTE — Evaluation (Signed)
Physical Therapy Evaluation Patient Details Name: Laurie Steele MRN: [id number] DOB: Mar 19, 1941 Today's Date: 05/24/2016   History of Present Illness  Pt is a 75 yr old female with past history of IDDM, ESRD on peritoneal dialysis, HTN, glaucoma, retinopathy, peripheral neuropathy, h/o gout, HLD, and chronic anemia who presents to the hospital due to [place] and admitted with hypoglycemia and hypothermia.     Clinical Impression  Prior to admission, pt was requiring physical assist for functional mobility with 4-wheel walker, but only "sometimes" when she was feeling weak. Pt lives in a 1-story, level entry home with her son, but is alone for periods of time throughout the day. Currently pt is min A x2 for sit <> stand transfers and ambulation x 1ft with RW, distance limited by fatigue. Pt is significantly deconditioned, demonstrated by her decreased strength, balance, and independence with functional mobility. Pt would benefit from skilled PT to address noted impairments and functional limitations.  Recommend pt discharge to STR when medically appropriate.     Follow Up Recommendations SNF    Equipment Recommendations   (to be determined by post acute care)    Recommendations for Other Services       Precautions / Restrictions Precautions Precautions: Fall Precaution Comments: Peritoneal dialysis port; gait belt high Restrictions Weight Bearing Restrictions: No      Mobility  Bed Mobility General bed mobility comments: Received in chair, returned to chair; not assessed  Transfers Overall transfer level: Needs assistance Equipment used: Rolling walker (2 wheeled) Transfers: Sit to/from Stand Sit to Stand: Min assist;+2 physical assistance General transfer comment: Vc's for technique and DME use. Increased time required. Able to perform standing marching at RW, though this task requires considerable effort.  Ambulation/Gait Ambulation/Gait assistance: Min assist;+2 physical  assistance (and close seating surfaces for safety) Ambulation Distance (Feet): 8 Feet Assistive device: Rolling walker (2 wheeled) Gait Pattern/deviations: Decreased step length - right;Decreased step length - left;Shuffle;Narrow base of support;Step-to pattern Gait velocity: Decreased   General Gait Details: With increasing distance, pt initially begins to demonstrate more fluid/less effortful gait. But, after ~7ft, pt looking extremely fatigued and slowing down considerably.   Stairs    Wheelchair Mobility    Modified Rankin (Stroke Patients Only)       Balance Overall balance assessment: Needs assistance Sitting-balance support: Bilateral upper extremity supported;Feet supported Sitting balance-Leahy Scale: Good   Postural control: Posterior lean Standing balance support: Bilateral upper extremity supported (on RW) Standing balance-Leahy Scale: Poor Standing balance comment: Pt initially assumes stand with a posterior lean requiring mod A x2 to prevent LOB. With increased time and effort, pt is able to shift bodyweight over base of support requiring only min A x2 to maintain balance with RW.      Pertinent Vitals/Pain Pain Assessment: 0-10 Pain Score: 3  Pain Location: RUE just proximal to elbow Pain Descriptors / Indicators: Aching;Constant Pain Intervention(s): Limited activity within patient's tolerance;Monitored during session (RN notified)  HR and O2 monitored throughout session and maintained WFL.    Home Living Family/patient expects to be discharged to:: Private residence Living Arrangements: Children Available Help at Discharge: Family;Available PRN/intermittently Type of Home: House Home Access: Level entry Home Layout: One level Home Equipment: Walker - 4 wheels;Grab bars - toilet;Grab bars - tub/shower    Prior Function Level of Independence: Independent with assistive device(s);Needs assistance  Comments: Requires assist "sometimes" for ambulation.  Daughter comes most days to assist with ADLs of bathing and grooming.  Extremity/Trunk Assessment   Upper Extremity Assessment: Generalized weakness  Lower Extremity Assessment: Generalized weakness (partial range against gravity) Light touch sensation intact bilaterally  Cervical / Trunk Assessment: Normal    Communication   Communication: No difficulties  Cognition Arousal/Alertness: Awake/alert Behavior During Therapy: Flat affect Overall Cognitive Status: Within Functional Limits for tasks assessed    General Comments General comments (skin integrity, edema, etc.): peritoneal dialysis port L abdomen  Nursing cleared pt for participation in physical therapy.  Pt agreeable to PT session.           Assessment/Plan    PT Assessment Patient needs continued PT services  PT Diagnosis Difficulty walking;Generalized weakness   PT Problem List Decreased strength;Decreased activity tolerance;Decreased balance;Decreased mobility;Decreased knowledge of use of DME  PT Treatment Interventions DME instruction;Gait training;Functional mobility training;Therapeutic activities;Therapeutic exercise;Balance training;Patient/family education   PT Goals (Current goals can be found in the Care Plan section) Acute Rehab PT Goals Patient Stated Goal: To get stronger PT Goal Formulation: With patient Time For Goal Achievement: 06/07/16 Potential to Achieve Goals: Good    Frequency Min 2X/week   Barriers to discharge Assist levels       End of Session Equipment Utilized During Treatment: Gait belt (high) Activity Tolerance: Patient limited by fatigue Patient left: in chair;with call bell/phone within reach;with chair alarm set;with family/visitor present Nurse Communication: Mobility status;Precautions (RUE pain)         Time: [id number] PT Time Calculation (min) (ACUTE ONLY): 21 min   Charges:         PT G Codes:        Yeilyn Gent, SPT 05/24/2016, 4:15 PM

## 2016-05-24 NOTE — Progress Notes (Signed)
POST PD [place]

## 2016-05-24 NOTE — NC FL2 (Signed)
[place] LEVEL OF CARE SCREENING TOOL     IDENTIFICATION  Patient Name: Laurie Steele Birthdate: Oct 05, 1941 Sex: female Admission Date (Current Location): 05/21/2016  [place] and [place] Number:  Engineering geologist and Address:  [facility], [address] [place], [place] [address]      Provider Number: [id number]  Attending Physician Name and Address:  Henreitta Leber, MD  Relative Name and Phone Number:       Current Level of Care: Hospital Recommended Level of Care: [place] Prior Approval Number:    Date Approved/Denied:   PASRR Number:  [id number])  Discharge Plan: SNF    Current Diagnoses: Patient Active Problem List   Diagnosis Date Noted  . Hypoglycemia 05/21/2016  . Chest pain 04/05/2016  . Hypercalcemia 03/14/2016  . Bronchitis 03/03/2012  . Type II or unspecified type diabetes mellitus with renal manifestations, not stated as uncontrolled 12/18/2011  . END STAGE RENAL DISEASE 10/23/2010  . NEUROPATHY 12/13/2009  . GOUT 02/15/2009  . SLEEP DISORDER 02/15/2009  . GERD 02/23/2008  . HYPERLIPIDEMIA 05/14/2007  . ANEMIA, CHRONIC 05/14/2007  . HYPERTENSION 05/14/2007    Orientation RESPIRATION BLADDER Height & Weight     Self, Time, Situation, Place  Normal Continent Weight: 221 lb 12.8 oz (100.608 kg) Height:  5\' 7"  (170.2 cm)  BEHAVIORAL SYMPTOMS/MOOD NEUROLOGICAL BOWEL NUTRITION STATUS   (None)  (None) Continent Diet (renal/carb modified with fluid restriction )  AMBULATORY STATUS COMMUNICATION OF NEEDS Skin   Extensive Assist Verbally Normal                       Personal Care Assistance Level of Assistance  Bathing, Feeding, Dressing Bathing Assistance: Limited assistance Feeding assistance: Independent Dressing Assistance: Limited assistance     Functional Limitations Info  Sight, Hearing, Speech Sight Info: Adequate Hearing Info: Adequate Speech Info: Adequate     SPECIAL CARE FACTORS FREQUENCY  PT (By licensed PT), OT (By licensed OT)     PT Frequency:  (5) OT Frequency:  (5)            Contractures      Additional Factors Info  Insulin Sliding Scale, Code Status, Allergies Code Status Info:  (Full Code) Allergies Info:  (No Known Allergies)   Insulin Sliding Scale Info:  (insulin glargine (LANTUS) injection 42 Units 42 Units, Subcutaneous, Every morning - 10a; insulin aspart (novoLOG) injection 0-5 Units 0-5 Units, Subcutaneous, Daily at bedtime; insulin aspart (novoLOG) injection 0-9 Units 0-9 Units, Subcutaneous, 3 time)       Current Medications (05/24/2016):  This is the current hospital active medication list Current Facility-Administered Medications  Medication Dose Route Frequency Provider Last Rate Last Dose  . acetaminophen (TYLENOL) tablet 650 mg  650 mg Oral Q6H PRN Henreitta Leber, MD   650 mg at 05/24/16 [id number]   Or  . acetaminophen (TYLENOL) suppository 650 mg  650 mg Rectal Q6H PRN Henreitta Leber, MD      . allopurinol (ZYLOPRIM) tablet 100 mg  100 mg Oral Daily Henreitta Leber, MD   100 mg at 05/24/16 1111  . ALPRAZolam Duanne Moron) tablet 0.25 mg  0.25 mg Oral QHS PRN Henreitta Leber, MD   0.25 mg at 05/22/16 2223  . amitriptyline (ELAVIL) tablet 25 mg  25 mg Oral QHS PRN Henreitta Leber, MD      . aspirin EC tablet 325 mg  325 mg Oral Daily Vivek J  Sainani, MD   325 mg at 05/24/16 1111  . atenolol (TENORMIN) tablet 25 mg  25 mg Oral Daily Henreitta Leber, MD   25 mg at 05/24/16 1112  . ceFAZolin (ANCEF) 500 mg in dextrose 5 % 100 mL IVPB  500 mg Intravenous Q12H Lance Coon, MD   500 mg at 05/24/16 0610  . cinacalcet (SENSIPAR) tablet 90 mg  90 mg Oral Q lunch Henreitta Leber, MD   90 mg at 05/24/16 1200  . cyclobenzaprine (FLEXERIL) tablet 5 mg  5 mg Oral TID PRN Henreitta Leber, MD   5 mg at 05/22/16 0501  . dialysis solution 1.5% low-MG/low-CA dianeal solution   Intraperitoneal Q24H Harmeet Singh, MD      . epoetin  alfa (EPOGEN,PROCRIT) injection 20,000 Units  20,000 Units Subcutaneous Weekly Harmeet Singh, MD      . fluticasone (FLONASE) 50 MCG/ACT nasal spray 2 spray  2 spray Each Nare Daily PRN Henreitta Leber, MD      . gabapentin (NEURONTIN) capsule 100 mg  100 mg Oral TID Henreitta Leber, MD   100 mg at 05/24/16 1112  . gentamicin cream (GARAMYCIN) 0.1 % 1 application  1 application Topical Daily Harmeet Singh, MD   1 application at [id number]  . heparin 1000 unit/ml injection 3,000 Units  3,000 Units Intraperitoneal PRN Harmeet Singh, MD      . heparin injection 5,000 Units  5,000 Units Subcutaneous Q8H Henreitta Leber, MD   5,000 Units at 05/24/16 1459  . HYDROcodone-acetaminophen (NORCO/VICODIN) 5-325 MG per tablet 1 tablet  1 tablet Oral BID PRN Henreitta Leber, MD   1 tablet at 05/21/16 2358  . insulin aspart (novoLOG) injection 0-5 Units  0-5 Units Subcutaneous QHS Henreitta Leber, MD   3 Units at 05/23/16 2237  . insulin aspart (novoLOG) injection 0-9 Units  0-9 Units Subcutaneous TID WC Henreitta Leber, MD   3 Units at 05/24/16 1200  . [START ON 05/25/2016] insulin glargine (LANTUS) injection 42 Units  42 Units Subcutaneous q morning - 10a Henreitta Leber, MD      . isosorbide mononitrate (IMDUR) 24 hr tablet 30 mg  30 mg Oral BID Henreitta Leber, MD   30 mg at 05/24/16 1111  . latanoprost (XALATAN) 0.005 % ophthalmic solution 1 drop  1 drop Both Eyes QHS Henreitta Leber, MD   1 drop at 05/23/16 2237  . losartan (COZAAR) tablet 100 mg  100 mg Oral Daily Henreitta Leber, MD   100 mg at 05/24/16 1111  . metoCLOPramide (REGLAN) tablet 10 mg  10 mg Oral TID PRN Henreitta Leber, MD      . ondansetron [facility]) tablet 4 mg  4 mg Oral Q6H PRN Henreitta Leber, MD       Or  . ondansetron (ZOFRAN) injection 4 mg  4 mg Intravenous Q6H PRN Henreitta Leber, MD      . pravastatin (PRAVACHOL) tablet 80 mg  80 mg Oral QHS Henreitta Leber, MD   80 mg at 05/23/16 2237  . predniSONE (DELTASONE) tablet 40 mg  40  mg Oral Q breakfast Henreitta Leber, MD   40 mg at 05/22/16 0800   And  . predniSONE (DELTASONE) tablet 30 mg  30 mg Oral Q breakfast Henreitta Leber, MD   30 mg at 05/22/16 0835   And  . predniSONE (DELTASONE) tablet 20 mg  20 mg Oral Q breakfast Vivek J  Sainani, MD   20 mg at 05/23/16 0758   And  . predniSONE (DELTASONE) tablet 10 mg  10 mg Oral Q breakfast Henreitta Leber, MD   10 mg at 05/24/16 0836  . sevelamer carbonate (RENVELA) tablet 2,400 mg  2,400 mg Oral TID WC Henreitta Leber, MD   2,400 mg at 05/24/16 1200     Discharge Medications: Please see discharge summary for a list of discharge medications.  Relevant Imaging Results:  Relevant Lab Results:   Additional Information  (SSN [id number])  Lorenso Quarry Nikesh Teschner, LCSW

## 2016-05-24 NOTE — Progress Notes (Signed)
[place] [place] [facility] [place] NAME: Laurie Steele    MR#:  [id number]  DATE OF BIRTH:  1941-04-05  SUBJECTIVE:  Pt. Here due to hypoglycemia, hypothermia due to sepsis.  BC + for Group B Strep.  Hg. Improved post transfusion and will monitor.  Remains lethargic.   REVIEW OF SYSTEMS:    Review of Systems  Constitutional: Negative for fever and chills.  HENT: Negative for congestion and tinnitus.   Eyes: Negative for blurred vision and double vision.  Respiratory: Negative for cough, shortness of breath and wheezing.   Cardiovascular: Negative for chest pain, orthopnea and PND.  Gastrointestinal: Negative for nausea, vomiting, abdominal pain and diarrhea.  Genitourinary: Negative for dysuria and hematuria.  Neurological: Positive for weakness (generalized). Negative for dizziness, sensory change and focal weakness.  All other systems reviewed and are negative.   Nutrition: Renal/carb modified Tolerating Diet: Yes Tolerating PT: Await Eval.   DRUG ALLERGIES:  No Known Allergies  VITALS:  Blood pressure 152/52, pulse 66, temperature 98.1 F (36.7 C), temperature source Oral, resp. rate 18, height 5\' 7"  (1.702 m), weight 100.608 kg (221 lb 12.8 oz), SpO2 99 %.  PHYSICAL EXAMINATION:   Physical Exam  GENERAL:  75 y.o.-year-old patient lethargic lying in the bed lethargic but in no acute distress.   EYES: Pupils equal, round, reactive to light and accommodation. No scleral icterus. Extraocular muscles intact.  HEENT: Head atraumatic, normocephalic. Oropharynx and nasopharynx clear.  NECK:  Supple, no jugular venous distention. No thyroid enlargement, no tenderness.  LUNGS: Normal breath sounds bilaterally, no wheezing, rales, rhonchi. No use of accessory muscles of respiration.  CARDIOVASCULAR: S1, S2 normal. No murmurs, rubs, or gallops.  ABDOMEN: Soft, nontender, nondistended. Bowel sounds present. No organomegaly or mass. +PD cath in place  w/out acute drainage.  EXTREMITIES: No cyanosis, clubbing or edema b/l.    NEUROLOGIC: Cranial nerves II through XII are intact. No focal Motor or sensory deficits b/l.  Globally weak PSYCHIATRIC: The patient is alert and oriented x 3.  SKIN: No obvious rash, lesion, or ulcer.    LABORATORY PANEL:   CBC  Recent Labs Lab 05/24/16 0435  WBC 15.0*  HGB 7.7*  HCT 22.9*  PLT 179   ------------------------------------------------------------------------------------------------------------------  Chemistries   Recent Labs Lab 05/21/16 1136  05/23/16 0528  NA 138  < > 137  K 3.7  < > 3.8  CL 97*  < > 97*  CO2 23  < > 24  GLUCOSE 105*  < > 187*  BUN 87*  < > 98*  CREATININE 13.30*  < > 12.27*  CALCIUM 7.8*  < > 8.0*  AST 38  --   --   ALT 72*  --   --   ALKPHOS 92  --   --   BILITOT 0.4  --   --   < > = values in this interval not displayed. ------------------------------------------------------------------------------------------------------------------  Cardiac Enzymes  Recent Labs Lab 05/21/16 1136  TROPONINI 0.06*   ------------------------------------------------------------------------------------------------------------------  RADIOLOGY:  No results found.   ASSESSMENT AND PLAN:   75 year old female with past history of diabetes insulin-dependent, end-stage renal disease on peritoneal dialysis, essential hypertension, glaucoma, history of gout, hyperlipidemia who presents to the hospital due to altered mental status and noted to be hypoglycemic and also hypothermic.  1. Altered mental status-metabolic encephalopathy secondary to the hypothermia and hypoglycemia. -Hypoglycemia, hypothermia now resolved. Mental status improved but still remains somewhat lethargic.  2. Hypoglycemia-this is likely  secondary to sepsis. Now resolved. -Now more hyperglycemic  Continue Lantus and will advance dose,  Cont. sliding scale insulin for now.  3. Hypothermia-this is  secondary to sepsis. Now resolved. -Blood cultures positive for group B strep. The source is unclear. Continue IV Ancef. -Chest x-ray, urinalysis and peritoneal fluid analysis on negative for any acute infection.  4. Sepsis-patient's blood cultures positive for group B strep. Stress with infectious disease over the phone and the source was likely urinary in nature. We'll check urine culture. -We'll repeat cultures to make sure they're clearing. -Continue IV Ancef.   5. Anemia of chronic disease-patient's hemoglobin down to 6.1 yesterday. Likely secondary to end-stage renal disease. -Hemoccult pending. Hemoglobin improved post transfusion today. We'll follow serial counts.  6. End-stage renal disease on peritoneal dialysis-nephrology following the patient and continue dialysis as per them.  7. Essential hypertension-continue atenolol, amlodipine, clonidine, hydralazine, losartan.  8. Secondary hyperparathyroidism-continue Sensipar, Renvela.  9. Diabetic neuropathy-continue gabapentin.  10. Hyperlipidemia-continue Pravachol.  11. History of gout-no acute attack. Continue allopurinol.  We'll discontinue Foley, get a physical therapy consult.   All the records are reviewed and case discussed with Care Management/Social Workerr. Management plans discussed with the patient, family and they are in agreement.  CODE STATUS: Full  DVT Prophylaxis: Hep. SQ  TOTAL TIME TAKING CARE OF THIS PATIENT: 30 minutes.   POSSIBLE D/C IN 2-3 DAYS, DEPENDING ON CLINICAL CONDITION.   Henreitta Leber M.D on 05/24/2016 at 1:12 PM  Between 7am to 6pm - Pager - [phone]  After 6pm go to [email or web address] - password [email or web address] [facility]  Office  [phone]  CC: Primary care physician; [name], MD

## 2016-05-25 ENCOUNTER — Encounter
Admission: RE | Admit: 2016-05-25 | Discharge: 2016-05-25 | Disposition: A | Discharge status: Home / Self Care | Payer: Medicare Other | Source: Ambulatory Visit | Attending: Internal Medicine | Admitting: Internal Medicine

## 2016-05-25 ENCOUNTER — Inpatient Hospital Stay: Payer: Medicare Other

## 2016-05-25 ENCOUNTER — Encounter: Admission: RE | Payer: Self-pay | Source: Ambulatory Visit

## 2016-05-25 ENCOUNTER — Ambulatory Visit: Admission: RE | Admit: 2016-05-25 | Payer: Medicare Other | Source: Ambulatory Visit | Admitting: Vascular Surgery

## 2016-05-25 LAB — CBC
HEMATOCRIT: 22 % — AB (ref 35.0–47.0)
HEMOGLOBIN: 7.4 g/dL — AB (ref 12.0–16.0)
MCH: 32.7 pg (ref 26.0–34.0)
MCHC: 33.5 g/dL (ref 32.0–36.0)
MCV: 97.6 fL (ref 80.0–100.0)
Platelets: 176 10*3/uL (ref 150–440)
RBC: 2.26 MIL/uL — AB (ref 3.80–5.20)
RDW: 21.6 % — ABNORMAL HIGH (ref 11.5–14.5)
WBC: 13.4 10*3/uL — ABNORMAL HIGH (ref 3.6–11.0)

## 2016-05-25 LAB — TYPE AND SCREEN
ABO/RH(D): O POS
Antibody Screen: NEGATIVE
Unit division: 0

## 2016-05-25 LAB — URINE CULTURE
Culture: >=100000 — AB
Special Requests: NORMAL

## 2016-05-25 LAB — GLUCOSE, CAPILLARY
GLUCOSE-CAPILLARY: 171 mg/dL — AB (ref 65–99)
GLUCOSE-CAPILLARY: 143 mg/dL — AB (ref 65–99)
GLUCOSE-CAPILLARY: 171 mg/dL — AB (ref 65–99)
GLUCOSE-CAPILLARY: 187 mg/dL — AB (ref 65–99)
GLUCOSE-CAPILLARY: 146 mg/dL — AB (ref 65–99)
Glucose-Capillary: 241 mg/dL — ABNORMAL HIGH (ref 65–99)

## 2016-05-25 LAB — BODY FLUID CULTURE
Culture: NO GROWTH
GRAM STAIN: NONE SEEN
SPECIAL REQUESTS: NORMAL

## 2016-05-25 LAB — PHOSPHORUS: PHOSPHORUS: 6.2 mg/dL — AB (ref 2.5–4.6)

## 2016-05-25 SURGERY — LAPAROSCOPIC INSERTION CONTINUOUS AMBULATORY PERITONEAL DIALYSIS  (CAPD) CATHETER
Anesthesia: General

## 2016-05-25 NOTE — Progress Notes (Signed)
PT Cancellation Note  Patient Details Name: Laurie Steele MRN: [id number] DOB: 05-31-1941   Cancelled Treatment:    Reason Eval/Treat Not Completed: Patient at procedure or test/unavailable.  Pt currently off floor and not available for PT.  Will re-attempt PT treatment at a later date/time.   [name] 05/25/2016, 3:37 PM [name], [place]

## 2016-05-25 NOTE — Progress Notes (Signed)
Subjective:   Patient states that she is feeling well today. She complains of right arm and elbow pain. Reports no N/V. No SOB. Completed PD overnight with no problems.   Objective:  Vital signs in last 24 hours:  Temp:  [98.1 F (36.7 C)-98.6 F (37 C)] 98.6 F (37 C) (07/21 0919) Pulse Rate:  [62-66] 64 (07/21 0919) Resp:  [18-20] 19 (07/21 0919) BP: (125-152)/(52-57) 125/57 mmHg (07/21 0919) SpO2:  [95 %-99 %] 95 % (07/21 0919) Weight:  [100.018 kg (220 lb 8 oz)] 100.018 kg (220 lb 8 oz) (07/21 0433)  Weight change: -3.266 kg (-7 lb 3.2 oz) Filed Weights   05/23/16 1239 05/24/16 0616 05/25/16 0433  Weight: 103.284 kg (227 lb 11.2 oz) 100.608 kg (221 lb 12.8 oz) 100.018 kg (220 lb 8 oz)    Intake/Output:    Intake/Output Summary (Last 24 hours) at 05/25/16 1016 Last data filed at 05/25/16 0750  Gross per 24 hour  Intake    290 ml  Output     25 ml  Net    265 ml     Physical Exam: General: No acute distress, laying in bed.  HEENT Anicteric, moist mucous membranes   Neck Supple   Pulm/lungs Lungs are clear to auscultation bilaterally, normal breathing effort   CVS/Heart Regular rhythm, no rub or gallop   Abdomen:  Soft, nontender, exit site nontender   Extremities: Trace peripheral edema. Full range of motion of right arm. No ttp.   Neurologic: Alert and oriented  Skin: No acute rashes   Access: PD catheter        Basic Metabolic Panel:   Recent Labs Lab 05/21/16 1136 05/22/16 0444 05/23/16 0528 05/25/16 0416  NA 138 137 137  --   K 3.7 4.6 3.8  --   CL 97* 98* 97*  --   CO2 23 23 24   --   GLUCOSE 105* 402* 187*  --   BUN 87* 96* 98*  --   CREATININE 13.30* 12.82* 12.27*  --   CALCIUM 7.8* 7.9* 8.0*  --   PHOS  --   --   --  6.2*     CBC:  Recent Labs Lab 05/21/16 1136 05/22/16 0444 05/23/16 0528 05/24/16 0435 05/25/16 0416  WBC 12.9* 12.5* 13.5* 15.0* 13.4*  NEUTROABS 11.0*  --   --   --   --   HGB 7.3* 6.9* 6.1* 7.7* 7.4*  HCT  21.8* 21.1* 17.9* 22.9* 22.0*  MCV 104.3* 104.4* 104.2* 98.4 97.6  PLT 201 186 168 179 176      Microbiology:  Recent Results (from the past 720 hour(s))  Surgical pcr screen     Status: None   Collection Time: 05/15/16  2:16 PM  Result Value Ref Range Status   MRSA, PCR NEGATIVE NEGATIVE Final   Staphylococcus aureus NEGATIVE NEGATIVE Final    Comment:        The Xpert SA Assay (FDA approved for NASAL specimens in patients over 71 years of age), is one component of a comprehensive surveillance program.  Test performance has been validated by [facility] for patients greater than or equal to 65 year old. It is not intended to diagnose infection nor to guide or monitor treatment.   Blood culture (routine x 2)     Status: Abnormal   Collection Time: 05/21/16 12:01 PM  Result Value Ref Range Status   Specimen Description BLOOD RIGHT ANTECUBITAL  Final   Special Requests BOTTLES DRAWN  AEROBIC AND ANAEROBIC 4CC  Final   Culture  Setup Time   Final    GRAM POSITIVE COCCI ANAEROBIC BOTTLE ONLY CRITICAL RESULT CALLED TO, READ BACK BY AND VERIFIED WITH: NATE COOKSON 05/22/16 0130 TLB/SGD    Culture (A)  Final    STREPTOCOCCUS AGALACTIAE SUSCEPTIBILITIES PERFORMED ON PREVIOUS CULTURE WITHIN THE LAST 5 DAYS. Performed at [facility]    Report Status 05/24/2016 FINAL  Final  Blood culture (routine x 2)     Status: Abnormal   Collection Time: 05/21/16 12:01 PM  Result Value Ref Range Status   Specimen Description BLOOD LEFT HAND  Final   Special Requests BOTTLES DRAWN AEROBIC AND ANAEROBIC  [place]  Final   Culture  Setup Time   Final    GRAM POSITIVE COCCI ANAEROBIC BOTTLE ONLY CRITICAL RESULT CALLED TO, READ BACK BY AND VERIFIED WITH: NATE COOKSON ON 05/22/16 AT 0130 BY TLB CONFIRMED BY TLB/KBH    Culture STREPTOCOCCUS AGALACTIAE (A)  Final   Report Status 05/24/2016 FINAL  Final   Organism ID, Bacteria STREPTOCOCCUS AGALACTIAE  Final      Susceptibility    Streptococcus agalactiae - MIC*    CLINDAMYCIN <=0.25 SENSITIVE Sensitive     AMPICILLIN <=0.25 SENSITIVE Sensitive     ERYTHROMYCIN 2 RESISTANT Resistant     VANCOMYCIN 0.5 SENSITIVE Sensitive     CEFTRIAXONE <=0.12 SENSITIVE Sensitive     LEVOFLOXACIN 0.5 SENSITIVE Sensitive     * STREPTOCOCCUS AGALACTIAE  Blood Culture ID Panel (Reflexed)     Status: Abnormal   Collection Time: 05/21/16 12:01 PM  Result Value Ref Range Status   Enterococcus species NOT DETECTED NOT DETECTED Final   Vancomycin resistance NOT DETECTED NOT DETECTED Final   Listeria monocytogenes NOT DETECTED NOT DETECTED Final   Staphylococcus species NOT DETECTED NOT DETECTED Final   Staphylococcus aureus NOT DETECTED NOT DETECTED Final   Methicillin resistance NOT DETECTED NOT DETECTED Final   Streptococcus species DETECTED (A) NOT DETECTED Final    Comment: CRITICAL RESULT CALLED TO, READ BACK BY AND VERIFIED WITH: NATE COOKSON ON 05/22/16 AT 0130 BY TLB    Streptococcus agalactiae DETECTED (A) NOT DETECTED Final    Comment: CRITICAL RESULT CALLED TO, READ BACK BY AND VERIFIED WITH: NATE COOKSON ON 05/22/16 AT 0130 BY TLB    Streptococcus pneumoniae NOT DETECTED NOT DETECTED Final   Streptococcus pyogenes NOT DETECTED NOT DETECTED Final   Acinetobacter baumannii NOT DETECTED NOT DETECTED Final   Enterobacteriaceae species NOT DETECTED NOT DETECTED Final   Enterobacter cloacae complex NOT DETECTED NOT DETECTED Final   Escherichia coli NOT DETECTED NOT DETECTED Final   Klebsiella oxytoca NOT DETECTED NOT DETECTED Final   Klebsiella pneumoniae NOT DETECTED NOT DETECTED Final   Proteus species NOT DETECTED NOT DETECTED Final   Serratia marcescens NOT DETECTED NOT DETECTED Final   Carbapenem resistance NOT DETECTED NOT DETECTED Final   Haemophilus influenzae NOT DETECTED NOT DETECTED Final   Neisseria meningitidis NOT DETECTED NOT DETECTED Final   Pseudomonas aeruginosa NOT DETECTED NOT DETECTED Final   Candida  albicans NOT DETECTED NOT DETECTED Final   Candida glabrata NOT DETECTED NOT DETECTED Final   Candida krusei NOT DETECTED NOT DETECTED Final   Candida parapsilosis NOT DETECTED NOT DETECTED Final   Candida tropicalis NOT DETECTED NOT DETECTED Final  Body fluid culture     Status: None (Preliminary result)   Collection Time: 05/21/16 10:31 PM  Result Value Ref Range Status   Specimen Description PERITONEAL  Final   Special Requests Normal  Final   Gram Stain NO WBC SEEN NO ORGANISMS SEEN   Final   Culture   Final    NO GROWTH 2 DAYS Performed at [facility]    Report Status PENDING  Incomplete  CULTURE, BLOOD (ROUTINE X 2) w Reflex to ID Panel     Status: None (Preliminary result)   Collection Time: 05/24/16 10:10 AM  Result Value Ref Range Status   Specimen Description BLOOD RIGHT ASSIST CONTROL  Final   Special Requests BOTTLES DRAWN AEROBIC AND ANAEROBIC  8CC  Final   Culture NO GROWTH < 24 HOURS  Final   Report Status PENDING  Incomplete  CULTURE, BLOOD (ROUTINE X 2) w Reflex to ID Panel     Status: None (Preliminary result)   Collection Time: 05/24/16 10:10 AM  Result Value Ref Range Status   Specimen Description BLOOD LEFT ASSIST CONTROL  Final   Special Requests BOTTLES DRAWN AEROBIC AND ANAEROBIC  [place]  Final   Culture NO GROWTH < 24 HOURS  Final   Report Status PENDING  Incomplete    Coagulation Studies: No results for input(s): LABPROT, INR in the last 72 hours.  Urinalysis: No results for input(s): COLORURINE, LABSPEC, PHURINE, GLUCOSEU, HGBUR, BILIRUBINUR, KETONESUR, PROTEINUR, UROBILINOGEN, NITRITE, LEUKOCYTESUR in the last 72 hours.  Invalid input(s): APPERANCEUR    Imaging: No results found.   Medications:     . allopurinol  100 mg Oral Daily  . aspirin EC  325 mg Oral Daily  . atenolol  25 mg Oral Daily  .  ceFAZolin (ANCEF) IV  500 mg Intravenous Q12H  . cinacalcet  90 mg Oral Q lunch  . dialysis solution 1.5% low-MG/low-CA    Intraperitoneal Q24H  . epoetin (EPOGEN/PROCRIT) injection  20,000 Units Subcutaneous Weekly  . gabapentin  100 mg Oral TID  . gentamicin cream  1 application Topical Daily  . heparin  5,000 Units Subcutaneous Q8H  . insulin aspart  0-5 Units Subcutaneous QHS  . insulin aspart  0-9 Units Subcutaneous TID WC  . insulin glargine  42 Units Subcutaneous q morning - 10a  . isosorbide mononitrate  30 mg Oral BID  . latanoprost  1 drop Both Eyes QHS  . losartan  100 mg Oral Daily  . pravastatin  80 mg Oral QHS  . predniSONE  40 mg Oral Q breakfast   And  . predniSONE  30 mg Oral Q breakfast   And  . predniSONE  20 mg Oral Q breakfast  . sevelamer carbonate  2,400 mg Oral TID WC   acetaminophen **OR** acetaminophen, ALPRAZolam, amitriptyline, cyclobenzaprine, fluticasone, heparin, HYDROcodone-acetaminophen, metoCLOPramide, ondansetron **OR** ondansetron (ZOFRAN) IV  Assessment/ Plan:  75 y.o. female end-stage renal disease, peritoneal dialysis, insulin-dependent diabetes, with complications of retinopathy, nephropathy, glaucoma, GERD, history of gout, hypertension, hyperlipidemia  1. End-stage renal disease We will continue her home doses regimen. Patient reports 1700 cc 6 exchanges  2. Secondary hyperparathyroidism Phosphorus 6.2 Continue sevelamer home dose  3. Anemia of chronic kidney disease We'll start her on Procrit Iron acceptable Stool for guaIc is negative. No evidence of active bleed.  4. Hypoglycemia, Workup revealed Group B Strep bacteremia Patient received Cefazolin for treatment. PD fluid cell count and culture are neg for peritonitis   LOS: 4 Iliana Hutt 7/21/201710:16 AM

## 2016-05-25 NOTE — Progress Notes (Signed)
[place] at [place] NAME: Laurie Steele    MR#:  [id number]  DATE OF BIRTH:  01/29/1941  SUBJECTIVE:  Pt. Here due to hypoglycemia, hypothermia due to sepsis.  BC + for Group B Strep.  In complaining that her right upper extremity is painful but it's been painful 4 weeks.Marland Kitchen    REVIEW OF SYSTEMS:    Review of Systems  Constitutional: Negative for fever and chills.  HENT: Negative for congestion and tinnitus.   Eyes: Negative for blurred vision and double vision.  Respiratory: Negative for cough, shortness of breath and wheezing.   Cardiovascular: Negative for chest pain, orthopnea and PND.  Gastrointestinal: Negative for nausea, vomiting, abdominal pain and diarrhea.  Genitourinary: Negative for dysuria and hematuria.  Neurological: Positive for weakness (generalized). Negative for dizziness, sensory change and focal weakness.  All other systems reviewed and are negative.   Nutrition: Renal/carb modified Tolerating Diet: Yes Tolerating PT: Eval noted.    DRUG ALLERGIES:  No Known Allergies  VITALS:  Blood pressure 131/59, pulse 62, temperature 98.6 F (37 C), temperature source Oral, resp. rate 19, height 5\' 7"  (1.702 m), weight 100.018 kg (220 lb 8 oz), SpO2 95 %.  PHYSICAL EXAMINATION:   Physical Exam  GENERAL:  75 y.o.-year-old patient lethargic lying in the bed lethargic but in no acute distress.   EYES: Pupils equal, round, reactive to light and accommodation. No scleral icterus. Extraocular muscles intact.  HEENT: Head atraumatic, normocephalic. Oropharynx and nasopharynx clear.  NECK:  Supple, no jugular venous distention. No thyroid enlargement, no tenderness.  LUNGS: Normal breath sounds bilaterally, no wheezing, rales, rhonchi. No use of accessory muscles of respiration.  CARDIOVASCULAR: S1, S2 normal. No murmurs, rubs, or gallops.  ABDOMEN: Soft, nontender, nondistended. Bowel sounds present. No organomegaly or mass. +  PD cath in place w/out acute drainage.  EXTREMITIES: No cyanosis, clubbing, Right upper extremity edema 1+ compared to the left.    NEUROLOGIC: Cranial nerves II through XII are intact. No focal Motor or sensory deficits b/l.  Globally weak PSYCHIATRIC: The patient is alert and oriented x 3.  SKIN: No obvious rash, lesion, or ulcer.    LABORATORY PANEL:   CBC  Recent Labs Lab 05/25/16 0416  WBC 13.4*  HGB 7.4*  HCT 22.0*  PLT 176   ------------------------------------------------------------------------------------------------------------------  Chemistries   Recent Labs Lab 05/21/16 1136  05/23/16 0528  NA 138  < > 137  K 3.7  < > 3.8  CL 97*  < > 97*  CO2 23  < > 24  GLUCOSE 105*  < > 187*  BUN 87*  < > 98*  CREATININE 13.30*  < > 12.27*  CALCIUM 7.8*  < > 8.0*  AST 38  --   --   ALT 72*  --   --   ALKPHOS 92  --   --   BILITOT 0.4  --   --   < > = values in this interval not displayed. ------------------------------------------------------------------------------------------------------------------  Cardiac Enzymes  Recent Labs Lab 05/21/16 1136  TROPONINI 0.06*   ------------------------------------------------------------------------------------------------------------------  RADIOLOGY:  No results found.   ASSESSMENT AND PLAN:   75 year old female with past history of diabetes insulin-dependent, end-stage renal disease on peritoneal dialysis, essential hypertension, glaucoma, history of gout, hyperlipidemia who presents to the hospital due to altered mental status and noted to be hypoglycemic and also hypothermic.  1. Altered mental status-metabolic encephalopathy secondary to the hypothermia and hypoglycemia. -Hypoglycemia, hypothermia now  resolved. Mental status improved and back to baseline.  2. Hypoglycemia-this is likely secondary to sepsis. Now resolved.  3. Hypothermia-this is secondary to sepsis. Now resolved.  4. Sepsis-patient's blood  cultures positive for group B strep. Discussed with infectious disease yesterday and likely source is probably urinary. Urine cultures although growing yeast. -Repeat blood cultures from yesterday are negative. Continue IV Ancef and can likely switch to oral meds in the next day or 2 prior to discharge.  5. Anemia of chronic disease-Likely secondary to end-stage renal disease. -Improved with transfusion will follow serial counts. Hemoccult negative.  6. End-stage renal disease on peritoneal dialysis-nephrology following the patient and continue dialysis as per them.  7. Essential hypertension-continue atenolol, amlodipine, clonidine, hydralazine, losartan.  8. Secondary hyperparathyroidism-continue Sensipar, Renvela.  9. Diabetic neuropathy-continue gabapentin.  10. Hyperlipidemia-continue Pravachol.  11. History of gout-no acute attack. Continue allopurinol.  12. Right upper Ext. Swelling pain - will get Doppler of RUE r/o DVT.  Appreciate PT eval and pt. Will need SNF at discharge.    All the records are reviewed and case discussed with Care Management/Social Workerr. Management plans discussed with the patient, family and they are in agreement.  CODE STATUS: Full  DVT Prophylaxis: Hep. SQ  TOTAL TIME TAKING CARE OF THIS PATIENT: 30 minutes.   POSSIBLE D/C IN 1-2 DAYS, DEPENDING ON CLINICAL CONDITION.   Henreitta Leber M.D on 05/25/2016 at 2:08 PM  Between 7am to 6pm - Pager - [phone]  After 6pm go to [email or web address] - password [email or web address] [facility] [facility]  Office  [phone]  CC: Primary care physician; [name], MD

## 2016-05-25 NOTE — Care Management Important Message (Signed)
Important Message  Patient Details  Name: Laurie Steele MRN: [id number] Date of Birth: 08/10/1941   Medicare Important Message Given:  Yes    Beverly Sessions, RN 05/25/2016, 10:52 AM

## 2016-05-25 NOTE — Progress Notes (Signed)
CCPD STARTED

## 2016-05-25 NOTE — Progress Notes (Signed)
Per Dr. Verdell Carmine bladder scan again at 1800 and in and out cath if greater than 454ml.

## 2016-05-25 NOTE — Clinical Social Work Note (Signed)
Clinical Social Work Assessment  Patient Details  Name: Laurie Steele MRN: [id number] Date of Birth: 1940-12-18  Date of referral:  05/25/16               Reason for consult:  Discharge Planning                Permission sought to share information with:  Family Supports Permission granted to share information::  Yes, Verbal Permission Granted  Name::        Agency::     Relationship::   Laurie Steele)  Contact Information:     Housing/Transportation Living arrangements for the past 2 months:  Single Family Home Source of Information:  Patient Patient Interpreter Needed:  None Criminal Activity/Legal Involvement Pertinent to Current Situation/Hospitalization:  No - Comment as needed Significant Relationships:  Adult Children, Other Family Members Lives with:  Self Do you feel safe going back to the place where you live?  Yes Need for family participation in patient care:  Yes (Comment) Laurie Steele)  Care giving concerns:  PT recommends that patient will benefit from SNF placement at discharge.    Social Worker assessment / plan:  CSW met with patient at bedside. Introduced herself and her role. Patient reports that she feels she will benefit from SNF placement at discharge. Stated that she has not been to SNF. Reported that she's heard that [facility] a good facility. Granted CSW verbal permission to send SNF referral to SNFs in [place]. FL2/ PASRR completed and faxed to SNFs in [facility]. Placed in MDs basket for cosign. Patient granted CSW verbal permission to speak to her children and sister. Reported their information is listed on her facesheet.  CSW presented bed offers. Accepted bed offer at [facility]. CSW informed Maudie Mercury- Development worker, international aid at [place]. CSW will continue to follow and assist.   Employment status:  Retired Nurse, adult PT Recommendations:  [place] / Referral to community resources:   [place]  Patient/Family's Response to care:  Patient is in agreement for SNF placement.   Patient/Family's Understanding of and Emotional Response to Diagnosis, Current Treatment, and Prognosis:  Patient verbalized understanding of her diagnosis, current treatment and prognosis. Thanked CSW for her assistance.   Emotional Assessment Appearance:  Appears stated age Attitude/Demeanor/Rapport:   (None) Affect (typically observed):  Accepting, Calm, Pleasant Orientation:  Oriented to Self, Oriented to Place, Oriented to  Time, Oriented to Situation Alcohol / Substance use:  Not Applicable Psych involvement (Current and /or in the community):  No (Comment)  Discharge Needs  Concerns to be addressed:  Discharge Planning Concerns Readmission within the last 30 days:  No Current discharge risk:  Chronically ill Barriers to Discharge:  Continued Medical Work up   [employer], LCSW 05/25/2016, 2:22 PM

## 2016-05-25 NOTE — Progress Notes (Signed)
PD COMPLETED 

## 2016-05-26 LAB — GLUCOSE, CAPILLARY
GLUCOSE-CAPILLARY: 238 mg/dL — AB (ref 65–99)
Glucose-Capillary: 162 mg/dL — ABNORMAL HIGH (ref 65–99)
Glucose-Capillary: 200 mg/dL — ABNORMAL HIGH (ref 65–99)
Glucose-Capillary: 225 mg/dL — ABNORMAL HIGH (ref 65–99)

## 2016-05-26 MED ORDER — CEPHALEXIN 500 MG PO CAPS
500.0000 mg | ORAL_CAPSULE | Freq: Two times a day (BID) | ORAL | Status: DC
  Administered 2016-05-26 – 2016-05-28 (×4): 500 mg via ORAL
  Filled 2016-05-26 (×4): qty 1

## 2016-05-26 NOTE — Progress Notes (Signed)
[facility] NAME: Laurie Steele    MR#:  [id number]  DATE OF BIRTH:  1941-09-05  SUBJECTIVE: Still complains of right arm pain. Denies any other complaints. Hypoglycemia and HYPOTHERMIA improved.   Pt. Here due to hypoglycemia, hypothermia due to sepsis.  BC + for Group B Strep.  In complaining that her right upper extremity is painful but it's been painful 4 weeks.Marland Kitchen    REVIEW OF SYSTEMS:    Review of Systems  Constitutional: Negative for fever and chills.  HENT: Negative for congestion and tinnitus.   Eyes: Negative for blurred vision and double vision.  Respiratory: Negative for cough, shortness of breath and wheezing.   Cardiovascular: Negative for chest pain, orthopnea and PND.  Gastrointestinal: Negative for nausea, vomiting, abdominal pain and diarrhea.  Genitourinary: Negative for dysuria and hematuria.  Musculoskeletal:       Right arm pain.  Neurological: Positive for weakness (generalized). Negative for dizziness, sensory change and focal weakness.  All other systems reviewed and are negative.   Nutrition: Renal/carb modified Tolerating Diet: Yes Tolerating PT: Eval noted.    DRUG ALLERGIES:  No Known Allergies  VITALS:  Blood pressure 160/55, pulse 65, temperature 98.2 F (36.8 C), temperature source Oral, resp. rate 18, height 5\' 7"  (1.702 m), weight 100.5 kg (221 lb 9 oz), SpO2 99 %.  PHYSICAL EXAMINATION:   Physical Exam  GENERAL:  75 y.o.-year-old patient lethargic lying in the bed lethargic but in no acute distress.   EYES: Pupils equal, round, reactive to light and accommodation. No scleral icterus. Extraocular muscles intact.  HEENT: Head atraumatic, normocephalic. Oropharynx and nasopharynx clear.  NECK:  Supple, no jugular venous distention. No thyroid enlargement, no tenderness.  LUNGS: Normal breath sounds bilaterally, no wheezing, rales, rhonchi. No use of accessory muscles of respiration.   CARDIOVASCULAR: S1, S2 normal. No murmurs, rubs, or gallops.  ABDOMEN: Soft, nontender, nondistended. Bowel sounds present. No organomegaly or mass. + PD cath in place w/out acute drainage.  EXTREMITIES: No cyanosis, clubbing, Right upper extremity edema 1+ compared to the left.    NEUROLOGIC: Cranial nerves II through XII are intact. No focal Motor or sensory deficits b/l.  Globally weak PSYCHIATRIC: The patient is alert and oriented x 3.  SKIN: No obvious rash, lesion, or ulcer.    LABORATORY PANEL:   CBC  Recent Labs Lab 05/25/16 0416  WBC 13.4*  HGB 7.4*  HCT 22.0*  PLT 176   ------------------------------------------------------------------------------------------------------------------  Chemistries   Recent Labs Lab 05/21/16 1136  05/23/16 0528  NA 138  < > 137  K 3.7  < > 3.8  CL 97*  < > 97*  CO2 23  < > 24  GLUCOSE 105*  < > 187*  BUN 87*  < > 98*  CREATININE 13.30*  < > 12.27*  CALCIUM 7.8*  < > 8.0*  AST 38  --   --   ALT 72*  --   --   ALKPHOS 92  --   --   BILITOT 0.4  --   --   < > = values in this interval not displayed. ------------------------------------------------------------------------------------------------------------------  Cardiac Enzymes  Recent Labs Lab 05/21/16 1136  TROPONINI 0.06*   ------------------------------------------------------------------------------------------------------------------  RADIOLOGY:  US Venous Img Upper Uni Right  05/25/2016  CLINICAL DATA:  Right upper extremity pain and edema for the past 3 weeks. Evaluate for DVT. EXAM: RIGHT UPPER EXTREMITY VENOUS DOPPLER ULTRASOUND TECHNIQUE: Gray-scale sonography with graded  compression, as well as color Doppler and duplex ultrasound were performed to evaluate the upper extremity deep venous system from the level of the subclavian vein and including the jugular, axillary, basilic, radial, ulnar and upper cephalic vein. Spectral Doppler was utilized to evaluate flow  at rest and with distal augmentation maneuvers. COMPARISON:  None. FINDINGS: Contralateral Subclavian Vein: Respiratory phasicity is normal and symmetric with the symptomatic side. No evidence of thrombus. Normal compressibility. Internal Jugular Vein: No evidence of thrombus. Normal compressibility, respiratory phasicity and response to augmentation. Subclavian Vein: No evidence of thrombus. Normal compressibility, respiratory phasicity and response to augmentation. Axillary Vein: No evidence of thrombus. Normal compressibility, respiratory phasicity and response to augmentation. Cephalic Vein: No evidence of thrombus. Normal compressibility, respiratory phasicity and response to augmentation. Basilic Vein: No evidence of thrombus. Normal compressibility, respiratory phasicity and response to augmentation. Brachial Veins: No evidence of thrombus. Normal compressibility, respiratory phasicity and response to augmentation. Radial Veins: No evidence of thrombus. Normal compressibility, respiratory phasicity and response to augmentation. Ulnar Veins: No evidence of thrombus. Normal compressibility, respiratory phasicity and response to augmentation. Venous Reflux:  None visualized. Other Findings:  None visualized. IMPRESSION: No evidence of DVT within the right upper extremity. Electronically Signed   By: Sandi Mariscal M.D.   On: 05/25/2016 15:51     ASSESSMENT AND PLAN:   75 year old female with past history of diabetes insulin-dependent, end-stage renal disease on peritoneal dialysis, essential hypertension, glaucoma, history of gout, hyperlipidemia who presents to the hospital due to altered mental status and noted to be hypoglycemic and also hypothermic.  1. Altered mental status-metabolic encephalopathy secondary to the hypothermia and hypoglycemia. -Hypoglycemia, hypothermia now resolved. Mental status improved and back to baseline.  2. Hypoglycemia-this is likely secondary to sepsis. Now  resolved.  3. Hypothermia-this is secondary to sepsis. Now resolved.  4. Sepsis-patient's blood cultures positive for group B strep. Discussed with infectious disease by Dr.Sainani,and likely source is probably urinary. Urine cultures although growing yeast. -Repeat blood cultures from yesterday are negative. Continue IV Ancef and can likely switch to oral meds tomorrow. 5. Anemia of chronic disease-Likely secondary to end-stage renal disease. -Improved with transfusion will follow serial counts. Hemoccult negative.  6. End-stage renal disease on peritoneal dialysis-nephrology following the patient and continue dialysis as per them.  7. Essential hypertension-continue atenolol, amlodipine, clonidine, hydralazine, losartan.  8. Secondary hyperparathyroidism-continue Sensipar, Renvela.  9. Diabetic neuropathy-continue gabapentin.  10. Hyperlipidemia-continue Pravachol.  11. History of gout-no acute attack. Continue allopurinol.  12. Right upper Ext. Swelling pain -  Negative for DVT. Use heating pad.  Appreciate PT eval and pt. Will need SNF at discharge. Likely  D/c to snf on monday D/w daughter  All the records are reviewed and case discussed with Care Management/Social Workerr. Management plans discussed with the patient, family and they are in agreement.  CODE STATUS: Full  DVT Prophylaxis: Hep. SQ  TOTAL TIME TAKING CARE OF THIS PATIENT: 30 minutes.   POSSIBLE D/C IN 1-2 DAYS, DEPENDING ON CLINICAL CONDITION.   Epifanio Lesches M.D on 05/26/2016 at 9:12 AM  Between 7am to 6pm - Pager - [phone]  After 6pm go to [email or web address] - password [email or web address]  Office  [phone]  CC: Primary care physician; [name], MD

## 2016-05-26 NOTE — Progress Notes (Signed)
CCPD COMPLETED

## 2016-05-26 NOTE — Progress Notes (Signed)
Subjective:   Patient states that she is feeling well today. She complains of right arm and elbow pain. Reports no N/V. No SOB. Completed PD overnight with no problems. UF 800 cc   Objective:  Vital signs in last 24 hours:  Temp:  [98.2 F (36.8 C)-98.7 F (37.1 C)] 98.2 F (36.8 C) (07/22 0606) Pulse Rate:  [60-65] 65 (07/22 0606) Resp:  [16-18] 18 (07/22 0606) BP: (129-160)/(55-59) 160/55 mmHg (07/22 0606) SpO2:  [98 %-100 %] 99 % (07/22 0606) Weight:  [100.5 kg (221 lb 9 oz)] 100.5 kg (221 lb 9 oz) (07/22 0524)  Weight change: 0.482 kg (1 lb 1 oz) Filed Weights   05/24/16 0616 05/25/16 0433 05/26/16 0524  Weight: 100.608 kg (221 lb 12.8 oz) 100.018 kg (220 lb 8 oz) 100.5 kg (221 lb 9 oz)    Intake/Output:    Intake/Output Summary (Last 24 hours) at 05/26/16 1057 Last data filed at 05/26/16 1035  Gross per 24 hour  Intake    240 ml  Output   1464 ml  Net  -1224 ml     Physical Exam: General: No acute distress, laying in bed.  HEENT Anicteric, moist mucous membranes   Neck Supple   Pulm/lungs Lungs are clear to auscultation bilaterally, normal breathing effort   CVS/Heart Regular rhythm, no rub or gallop   Abdomen:  Soft, nontender, exit site nontender   Extremities: Trace peripheral edema.    Neurologic: Alert and oriented  Skin: No acute rashes   Access: PD catheter        Basic Metabolic Panel:   Recent Labs Lab 05/21/16 1136 05/22/16 0444 05/23/16 0528 05/25/16 0416  NA 138 137 137  --   K 3.7 4.6 3.8  --   CL 97* 98* 97*  --   CO2 23 23 24   --   GLUCOSE 105* 402* 187*  --   BUN 87* 96* 98*  --   CREATININE 13.30* 12.82* 12.27*  --   CALCIUM 7.8* 7.9* 8.0*  --   PHOS  --   --   --  6.2*     CBC:  Recent Labs Lab 05/21/16 1136 05/22/16 0444 05/23/16 0528 05/24/16 0435 05/25/16 0416  WBC 12.9* 12.5* 13.5* 15.0* 13.4*  NEUTROABS 11.0*  --   --   --   --   HGB 7.3* 6.9* 6.1* 7.7* 7.4*  HCT 21.8* 21.1* 17.9* 22.9* 22.0*  MCV  104.3* 104.4* 104.2* 98.4 97.6  PLT 201 186 168 179 176      Microbiology:  Recent Results (from the past 720 hour(s))  Surgical pcr screen     Status: None   Collection Time: 05/15/16  2:16 PM  Result Value Ref Range Status   MRSA, PCR NEGATIVE NEGATIVE Final   Staphylococcus aureus NEGATIVE NEGATIVE Final    Comment:        The Xpert SA Assay (FDA approved for NASAL specimens in patients over 39 years of age), is one component of a comprehensive surveillance program.  Test performance has been validated by [facility] for patients greater than or equal to 61 year old. It is not intended to diagnose infection nor to guide or monitor treatment.   Blood culture (routine x 2)     Status: Abnormal   Collection Time: 05/21/16 12:01 PM  Result Value Ref Range Status   Specimen Description BLOOD RIGHT ANTECUBITAL  Final   Special Requests BOTTLES DRAWN AEROBIC AND ANAEROBIC 4CC  Final   Culture  Setup Time   Final    GRAM POSITIVE COCCI ANAEROBIC BOTTLE ONLY CRITICAL RESULT CALLED TO, READ BACK BY AND VERIFIED WITH: NATE COOKSON 05/22/16 0130 TLB/SGD    Culture (A)  Final    STREPTOCOCCUS AGALACTIAE SUSCEPTIBILITIES PERFORMED ON PREVIOUS CULTURE WITHIN THE LAST 5 DAYS. Performed at [facility]    Report Status 05/24/2016 FINAL  Final  Blood culture (routine x 2)     Status: Abnormal   Collection Time: 05/21/16 12:01 PM  Result Value Ref Range Status   Specimen Description BLOOD LEFT HAND  Final   Special Requests BOTTLES DRAWN AEROBIC AND ANAEROBIC  [place]  Final   Culture  Setup Time   Final    GRAM POSITIVE COCCI ANAEROBIC BOTTLE ONLY CRITICAL RESULT CALLED TO, READ BACK BY AND VERIFIED WITH: NATE COOKSON ON 05/22/16 AT 0130 BY TLB CONFIRMED BY TLB/KB[facility]    Culture STREPTOCOCCUS AGALACTIAE (A)  Final   Report Status 05/24/2016 FINAL  Final   Organism ID, Bacteria STREPTOCOCCUS AGALACTIAE  Final      Susceptibility   Streptococcus agalactiae - MIC*     CLINDAMYCIN <=0.25 SENSITIVE Sensitive     AMPICILLIN <=0.25 SENSITIVE Sensitive     ERYTHROMYCIN 2 RESISTANT Resistant     VANCOMYCIN 0.5 SENSITIVE Sensitive     CEFTRIAXONE <=0.12 SENSITIVE Sensitive     LEVOFLOXACIN 0.5 SENSITIVE Sensitive     * STREPTOCOCCUS AGALACTIAE  Blood Culture ID Panel (Reflexed)     Status: Abnormal   Collection Time: 05/21/16 12:01 PM  Result Value Ref Range Status   Enterococcus species NOT DETECTED NOT DETECTED Final   Vancomycin resistance NOT DETECTED NOT DETECTED Final   Listeria monocytogenes NOT DETECTED NOT DETECTED Final   Staphylococcus species NOT DETECTED NOT DETECTED Final   Staphylococcus aureus NOT DETECTED NOT DETECTED Final   Methicillin resistance NOT DETECTED NOT DETECTED Final   Streptococcus species DETECTED (A) NOT DETECTED Final    Comment: CRITICAL RESULT CALLED TO, READ BACK BY AND VERIFIED WITH: NATE COOKSON ON 05/22/16 AT 0130 BY TLB    Streptococcus agalactiae DETECTED (A) NOT DETECTED Final    Comment: CRITICAL RESULT CALLED TO, READ BACK BY AND VERIFIED WITH: NATE COOKSON ON 05/22/16 AT 0130 BY TLB    Streptococcus pneumoniae NOT DETECTED NOT DETECTED Final   Streptococcus pyogenes NOT DETECTED NOT DETECTED Final   Acinetobacter baumannii NOT DETECTED NOT DETECTED Final   Enterobacteriaceae species NOT DETECTED NOT DETECTED Final   Enterobacter cloacae complex NOT DETECTED NOT DETECTED Final   Escherichia coli NOT DETECTED NOT DETECTED Final   Klebsiella oxytoca NOT DETECTED NOT DETECTED Final   Klebsiella pneumoniae NOT DETECTED NOT DETECTED Final   Proteus species NOT DETECTED NOT DETECTED Final   Serratia marcescens NOT DETECTED NOT DETECTED Final   Carbapenem resistance NOT DETECTED NOT DETECTED Final   Haemophilus influenzae NOT DETECTED NOT DETECTED Final   Neisseria meningitidis NOT DETECTED NOT DETECTED Final   Pseudomonas aeruginosa NOT DETECTED NOT DETECTED Final   Candida albicans NOT DETECTED NOT DETECTED  Final   Candida glabrata NOT DETECTED NOT DETECTED Final   Candida krusei NOT DETECTED NOT DETECTED Final   Candida parapsilosis NOT DETECTED NOT DETECTED Final   Candida tropicalis NOT DETECTED NOT DETECTED Final  Body fluid culture     Status: None   Collection Time: 05/21/16 10:31 PM  Result Value Ref Range Status   Specimen Description PERITONEAL  Final   Special Requests Normal  Final   Gram  Stain NO WBC SEEN NO ORGANISMS SEEN   Final   Culture   Final    NO GROWTH 3 DAYS Performed at [facility]    Report Status 05/25/2016 FINAL  Final  CULTURE, BLOOD (ROUTINE X 2) w Reflex to ID Panel     Status: None (Preliminary result)   Collection Time: 05/24/16 10:10 AM  Result Value Ref Range Status   Specimen Description BLOOD RIGHT ASSIST CONTROL  Final   Special Requests BOTTLES DRAWN AEROBIC AND ANAEROBIC  8CC  Final   Culture NO GROWTH 2 DAYS  Final   Report Status PENDING  Incomplete  CULTURE, BLOOD (ROUTINE X 2) w Reflex to ID Panel     Status: None (Preliminary result)   Collection Time: 05/24/16 10:10 AM  Result Value Ref Range Status   Specimen Description BLOOD LEFT ASSIST CONTROL  Final   Special Requests BOTTLES DRAWN AEROBIC AND ANAEROBIC  [place]  Final   Culture NO GROWTH 2 DAYS  Final   Report Status PENDING  Incomplete  Urine culture     Status: Abnormal   Collection Time: 05/24/16 11:15 AM  Result Value Ref Range Status   Specimen Description URINE, CATHETERIZED  Final   Special Requests Normal  Final   Culture >=100,000 COLONIES/mL YEAST (A)  Final   Report Status 05/25/2016 FINAL  Final    Coagulation Studies: No results for input(s): LABPROT, INR in the last 72 hours.  Urinalysis: No results for input(s): COLORURINE, LABSPEC, PHURINE, GLUCOSEU, HGBUR, BILIRUBINUR, KETONESUR, PROTEINUR, UROBILINOGEN, NITRITE, LEUKOCYTESUR in the last 72 hours.  Invalid input(s): APPERANCEUR    Imaging: US Venous Img Upper Uni Right  05/25/2016  CLINICAL DATA:   Right upper extremity pain and edema for the past 3 weeks. Evaluate for DVT. EXAM: RIGHT UPPER EXTREMITY VENOUS DOPPLER ULTRASOUND TECHNIQUE: Gray-scale sonography with graded compression, as well as color Doppler and duplex ultrasound were performed to evaluate the upper extremity deep venous system from the level of the subclavian vein and including the jugular, axillary, basilic, radial, ulnar and upper cephalic vein. Spectral Doppler was utilized to evaluate flow at rest and with distal augmentation maneuvers. COMPARISON:  None. FINDINGS: Contralateral Subclavian Vein: Respiratory phasicity is normal and symmetric with the symptomatic side. No evidence of thrombus. Normal compressibility. Internal Jugular Vein: No evidence of thrombus. Normal compressibility, respiratory phasicity and response to augmentation. Subclavian Vein: No evidence of thrombus. Normal compressibility, respiratory phasicity and response to augmentation. Axillary Vein: No evidence of thrombus. Normal compressibility, respiratory phasicity and response to augmentation. Cephalic Vein: No evidence of thrombus. Normal compressibility, respiratory phasicity and response to augmentation. Basilic Vein: No evidence of thrombus. Normal compressibility, respiratory phasicity and response to augmentation. Brachial Veins: No evidence of thrombus. Normal compressibility, respiratory phasicity and response to augmentation. Radial Veins: No evidence of thrombus. Normal compressibility, respiratory phasicity and response to augmentation. Ulnar Veins: No evidence of thrombus. Normal compressibility, respiratory phasicity and response to augmentation. Venous Reflux:  None visualized. Other Findings:  None visualized. IMPRESSION: No evidence of DVT within the right upper extremity. Electronically Signed   By: Sandi Mariscal M.D.   On: 05/25/2016 15:51     Medications:     . allopurinol  100 mg Oral Daily  . aspirin EC  325 mg Oral Daily  . atenolol  25  mg Oral Daily  .  ceFAZolin (ANCEF) IV  500 mg Intravenous Q12H  . cinacalcet  90 mg Oral Q lunch  . dialysis solution 1.5% low-MG/low-CA  Intraperitoneal Q24H  . epoetin (EPOGEN/PROCRIT) injection  20,000 Units Subcutaneous Weekly  . gabapentin  100 mg Oral TID  . gentamicin cream  1 application Topical Daily  . heparin  5,000 Units Subcutaneous Q8H  . insulin aspart  0-5 Units Subcutaneous QHS  . insulin aspart  0-9 Units Subcutaneous TID WC  . insulin glargine  42 Units Subcutaneous q morning - 10a  . isosorbide mononitrate  30 mg Oral BID  . latanoprost  1 drop Both Eyes QHS  . losartan  100 mg Oral Daily  . pravastatin  80 mg Oral QHS  . predniSONE  40 mg Oral Q breakfast   And  . predniSONE  30 mg Oral Q breakfast   And  . predniSONE  20 mg Oral Q breakfast  . sevelamer carbonate  2,400 mg Oral TID WC   acetaminophen **OR** acetaminophen, ALPRAZolam, amitriptyline, cyclobenzaprine, fluticasone, heparin, HYDROcodone-acetaminophen, metoCLOPramide, ondansetron **OR** ondansetron (ZOFRAN) IV  Assessment/ Plan:  75 y.o. female end-stage renal disease, peritoneal dialysis, insulin-dependent diabetes, with complications of retinopathy, nephropathy, glaucoma, GERD, history of gout, hypertension, hyperlipidemia  1. End-stage renal disease We will continue her home doses regimen. 1700 cc 6 exchanges 2.5% and 1.5% PD fluid UF 800 cc  2. Secondary hyperparathyroidism Phosphorus 6.2 Continue sevelamer home dose  3. Anemia of chronic kidney disease Continue SQ Procrit Iron acceptable Stool for guaic is negative. No evidence of active bleed. Patient received blood transfusion this admission  4. Hypoglycemia, Workup revealed Group B Strep bacteremia Patient received Cefazolin for treatment. PD fluid cell count and culture are neg for peritonitis   LOS: 5 Termaine Roupp 7/22/201710:57 AM

## 2016-05-27 LAB — CBC
HCT: 21 % — ABNORMAL LOW (ref 35.0–47.0)
Hemoglobin: 7.1 g/dL — ABNORMAL LOW (ref 12.0–16.0)
MCH: 33 pg (ref 26.0–34.0)
MCHC: 33.7 g/dL (ref 32.0–36.0)
MCV: 98.1 fL (ref 80.0–100.0)
PLATELETS: 154 10*3/uL (ref 150–440)
RBC: 2.14 MIL/uL — AB (ref 3.80–5.20)
RDW: 21.3 % — ABNORMAL HIGH (ref 11.5–14.5)
WBC: 13.6 10*3/uL — ABNORMAL HIGH (ref 3.6–11.0)

## 2016-05-27 LAB — GLUCOSE, CAPILLARY
GLUCOSE-CAPILLARY: 250 mg/dL — AB (ref 65–99)
GLUCOSE-CAPILLARY: 211 mg/dL — AB (ref 65–99)
GLUCOSE-CAPILLARY: 138 mg/dL — AB (ref 65–99)
Glucose-Capillary: 105 mg/dL — ABNORMAL HIGH (ref 65–99)

## 2016-05-27 MED ORDER — CLINDAMYCIN HCL 300 MG PO CAPS: 300.0000 mg | ORAL_CAPSULE | Freq: Three times a day (TID) | ORAL | 0 refills | Status: DC

## 2016-05-27 NOTE — Progress Notes (Signed)
Pre dialysis assessment 

## 2016-05-27 NOTE — Care Management Important Message (Signed)
Important Message  Patient Details  Name: LECRESHA MANNERING MRN: [id number] Date of Birth: 20-Jul-1941   Medicare Important Message Given:  Yes    Hilaria Titsworth A, RN 05/27/2016, 4:11 PM

## 2016-05-27 NOTE — Progress Notes (Signed)
Post dialysis assessment 

## 2016-05-27 NOTE — Progress Notes (Signed)
[facility] NAME: Laurie Steele    MR#:  [id number]  DATE OF BIRTH:  14-Jun-1941  SUBJECTIVE: Still complains of right arm pain. Denies any other complaints. Hypoglycemia and HYPOTHERMIA improved.   Pt. Here due to hypoglycemia, hypothermia due to sepsis.  BC + for Group B Strep.   REVIEW OF SYSTEMS:    Review of Systems  Constitutional: Negative for fever and chills.  HENT: Negative for congestion and tinnitus.   Eyes: Negative for blurred vision and double vision.  Respiratory: Negative for cough, shortness of breath and wheezing.   Cardiovascular: Negative for chest pain, orthopnea and PND.  Gastrointestinal: Negative for nausea, vomiting, abdominal pain and diarrhea.  Genitourinary: Negative for dysuria and hematuria.  Musculoskeletal:       Right arm pain.  Neurological: Positive for weakness (generalized). Negative for dizziness, sensory change and focal weakness.  All other systems reviewed and are negative.   Nutrition: Renal/carb modified Tolerating Diet: Yes Tolerating PT: Eval noted.    DRUG ALLERGIES:  No Known Allergies  VITALS:  Blood pressure (!) 161/84, pulse 63, temperature 98.4 F (36.9 C), temperature source Oral, resp. rate 20, height 5\' 7"  (1.702 m), weight 104 kg (229 lb 4.5 oz), SpO2 100 %.  PHYSICAL EXAMINATION:   Physical Exam  GENERAL:  75 y.o.-year-old patient lethargic lying in the bed lethargic but in no acute distress.   EYES: Pupils equal, round, reactive to light and accommodation. No scleral icterus. Extraocular muscles intact.  HEENT: Head atraumatic, normocephalic. Oropharynx and nasopharynx clear.  NECK:  Supple, no jugular venous distention. No thyroid enlargement, no tenderness.  LUNGS: Normal breath sounds bilaterally, no wheezing, rales, rhonchi. No use of accessory muscles of respiration.  CARDIOVASCULAR: S1, S2 normal. No murmurs, rubs, or gallops.  ABDOMEN: Soft, nontender,  nondistended. Bowel sounds present. No organomegaly or mass. + PD cath in place w/out acute drainage.  EXTREMITIES: No cyanosis, clubbing, Right upper extremity edema 1+ compared to the left.    NEUROLOGIC: Cranial nerves II through XII are intact. No focal Motor or sensory deficits b/l.  Globally weak PSYCHIATRIC: The patient is alert and oriented x 3.  SKIN: No obvious rash, lesion, or ulcer.    LABORATORY PANEL:   CBC  Recent Labs Lab 05/25/16 0416  WBC 13.4*  HGB 7.4*  HCT 22.0*  PLT 176   ------------------------------------------------------------------------------------------------------------------  Chemistries   Recent Labs Lab 05/21/16 1136  05/23/16 0528  NA 138  < > 137  K 3.7  < > 3.8  CL 97*  < > 97*  CO2 23  < > 24  GLUCOSE 105*  < > 187*  BUN 87*  < > 98*  CREATININE 13.30*  < > 12.27*  CALCIUM 7.8*  < > 8.0*  AST 38  --   --   ALT 72*  --   --   ALKPHOS 92  --   --   BILITOT 0.4  --   --   < > = values in this interval not displayed. ------------------------------------------------------------------------------------------------------------------  Cardiac Enzymes  Recent Labs Lab 05/21/16 1136  TROPONINI 0.06*   ------------------------------------------------------------------------------------------------------------------  RADIOLOGY:  US Venous Img Upper Uni Right  Result Date: 05/25/2016 CLINICAL DATA:  Right upper extremity pain and edema for the past 3 weeks. Evaluate for DVT. EXAM: RIGHT UPPER EXTREMITY VENOUS DOPPLER ULTRASOUND TECHNIQUE: Gray-scale sonography with graded compression, as well as color Doppler and duplex ultrasound were performed to evaluate the upper  extremity deep venous system from the level of the subclavian vein and including the jugular, axillary, basilic, radial, ulnar and upper cephalic vein. Spectral Doppler was utilized to evaluate flow at rest and with distal augmentation maneuvers. COMPARISON:  None. FINDINGS:  Contralateral Subclavian Vein: Respiratory phasicity is normal and symmetric with the symptomatic side. No evidence of thrombus. Normal compressibility. Internal Jugular Vein: No evidence of thrombus. Normal compressibility, respiratory phasicity and response to augmentation. Subclavian Vein: No evidence of thrombus. Normal compressibility, respiratory phasicity and response to augmentation. Axillary Vein: No evidence of thrombus. Normal compressibility, respiratory phasicity and response to augmentation. Cephalic Vein: No evidence of thrombus. Normal compressibility, respiratory phasicity and response to augmentation. Basilic Vein: No evidence of thrombus. Normal compressibility, respiratory phasicity and response to augmentation. Brachial Veins: No evidence of thrombus. Normal compressibility, respiratory phasicity and response to augmentation. Radial Veins: No evidence of thrombus. Normal compressibility, respiratory phasicity and response to augmentation. Ulnar Veins: No evidence of thrombus. Normal compressibility, respiratory phasicity and response to augmentation. Venous Reflux:  None visualized. Other Findings:  None visualized. IMPRESSION: No evidence of DVT within the right upper extremity. Electronically Signed   By: Sandi Mariscal M.D.   On: 05/25/2016 15:51     ASSESSMENT AND PLAN:   75 year old female with past history of diabetes insulin-dependent, end-stage renal disease on peritoneal dialysis, essential hypertension, glaucoma, history of gout, hyperlipidemia who presents to the hospital due to altered mental status and noted to be hypoglycemic and also hypothermic.  1. Altered mental status-metabolic encephalopathy secondary to the hypothermia and hypoglycemia. -Hypoglycemia, hypothermia now resolved. Mental status improved and back to baseline.  2. Hypoglycemia-this is likely secondary to sepsis. Now resolved.  3. Hypothermia-this is secondary to sepsis. Now resolved.  4.  Sepsis-patient's blood cultures positive for group B strep. Discussed with infectious disease by Dr.Sainani,and likely source is probably urinary. Urine cultures although growing yeast. -Repeat blood cultures from yesterday are negative.  Start by mouth clindamycin.   5. Anemia of chronic disease-Likely secondary to end-stage renal disease. -Improved with transfusion will follow serial counts. Hemoccult negative. Check cbc today 6. End-stage renal disease on peritoneal dialysis-nephrology following the patient and continue dialysis as per them.  7. Essential hypertension-continue atenolol, amlodipine, clonidine, hydralazine, losartan.  8. Secondary hyperparathyroidism-continue Sensipar, Renvela.  9. Diabetic neuropathy-continue gabapentin.  10. Hyperlipidemia-continue Pravachol.  11. History of gout-  Acute;attack of poly articular gout;finished prednisone,  12. Right upper Ext. Swelling pain -  Negative for DVT. Use heating pad.  Physical therapy recommended SNF placement. But patient needs to be on HD, hemodialysis access for her to go for her to go to rehabilitation. I discussed this with the Dr. Candiss Norse. He will talk to the son.  All the records are reviewed and case discussed with Care Management/Social Workerr. Management plans discussed with the patient, family and they are in agreement.  CODE STATUS: Full  DVT Prophylaxis: Hep. SQ  TOTAL TIME TAKING CARE OF THIS PATIENT: 30 minutes.   POSSIBLE D/C IN 1-2 DAYS, DEPENDING ON CLINICAL CONDITION.   Epifanio Lesches M.D on 05/27/2016 at 11:00 AM  Between 7am to 6pm - Pager - [phone]  After 6pm go to [email or web address] - password [email or web address] [facility]  Office  [phone]  CC: Primary care physician; [name], MD

## 2016-05-27 NOTE — Progress Notes (Signed)
CSW updated Maudie Mercury- Admissions coordinator at [facility] that patient will possibly discharge tomorrow. CSW will continue to follow and assist.  Ernest Pine, MSW, LCSW, [place] Social Worker [phone]

## 2016-05-27 NOTE — Progress Notes (Signed)
Subjective:   Patient states that she is feeling well today. No c/o of N/V. No SOB. Completed PD overnight with no problems.     Objective:  Vital signs in last 24 hours:  Temp:  [97.6 F (36.4 C)-98.9 F (37.2 C)] 97.6 F (36.4 C) (07/23 1226) Pulse Rate:  [60-63] 60 (07/23 1226) Resp:  [16-20] 16 (07/23 1226) BP: (159-181)/(63-90) 159/90 (07/23 1226) SpO2:  [98 %-100 %] 99 % (07/23 1226) Weight:  [104 kg (229 lb 4.5 oz)] 104 kg (229 lb 4.5 oz) (07/23 0517)  Weight change: 3.5 kg (7 lb 11.5 oz) Filed Weights   05/26/16 0524 05/27/16 0500 05/27/16 0517  Weight: 100.5 kg (221 lb 9 oz) 104 kg (229 lb 4.5 oz) 104 kg (229 lb 4.5 oz)    Intake/Output:    Intake/Output Summary (Last 24 hours) at 05/27/16 1354 Last data filed at 05/27/16 0701  Gross per 24 hour  Intake              826 ml  Output              850 ml  Net              -24 ml     Physical Exam: General: No acute distress, laying in bed.  HEENT Anicteric, moist mucous membranes   Neck Supple   Pulm/lungs Lungs are clear to auscultation bilaterally, normal breathing effort   CVS/Heart Regular rhythm, no rub or gallop   Abdomen:  Soft, nontender, exit site nontender   Extremities: Trace peripheral edema.    Neurologic: Alert and oriented  Skin: No acute rashes   Access: PD catheter        Basic Metabolic Panel:   Recent Labs Lab 05/21/16 1136 05/22/16 0444 05/23/16 0528 05/25/16 0416  NA 138 137 137  --   K 3.7 4.6 3.8  --   CL 97* 98* 97*  --   CO2 23 23 24   --   GLUCOSE 105* 402* 187*  --   BUN 87* 96* 98*  --   CREATININE 13.30* 12.82* 12.27*  --   CALCIUM 7.8* 7.9* 8.0*  --   PHOS  --   --   --  6.2*     CBC:  Recent Labs Lab 05/21/16 1136 05/22/16 0444 05/23/16 0528 05/24/16 0435 05/25/16 0416 05/27/16 1126  WBC 12.9* 12.5* 13.5* 15.0* 13.4* 13.6*  NEUTROABS 11.0*  --   --   --   --   --   HGB 7.3* 6.9* 6.1* 7.7* 7.4* 7.1*  HCT 21.8* 21.1* 17.9* 22.9* 22.0* 21.0*  MCV  104.3* 104.4* 104.2* 98.4 97.6 98.1  PLT 201 186 168 179 176 154      Microbiology:  Recent Results (from the past 720 hour(s))  Surgical pcr screen     Status: None   Collection Time: 05/15/16  2:16 PM  Result Value Ref Range Status   MRSA, PCR NEGATIVE NEGATIVE Final   Staphylococcus aureus NEGATIVE NEGATIVE Final    Comment:        The Xpert SA Assay (FDA approved for NASAL specimens in patients over 28 years of age), is one component of a comprehensive surveillance program.  Test performance has been validated by [facility] for patients greater than or equal to 23 year old. It is not intended to diagnose infection nor to guide or monitor treatment.   Blood culture (routine x 2)     Status: Abnormal   Collection  Time: 05/21/16 12:01 PM  Result Value Ref Range Status   Specimen Description BLOOD RIGHT ANTECUBITAL  Final   Special Requests BOTTLES DRAWN AEROBIC AND ANAEROBIC 4CC  Final   Culture  Setup Time   Final    GRAM POSITIVE COCCI ANAEROBIC BOTTLE ONLY CRITICAL RESULT CALLED TO, READ BACK BY AND VERIFIED WITH: NATE COOKSON 05/22/16 0130 TLB/SGD    Culture (A)  Final    STREPTOCOCCUS AGALACTIAE SUSCEPTIBILITIES PERFORMED ON PREVIOUS CULTURE WITHIN THE LAST 5 DAYS. Performed at [facility]    Report Status 05/24/2016 FINAL  Final  Blood culture (routine x 2)     Status: Abnormal   Collection Time: 05/21/16 12:01 PM  Result Value Ref Range Status   Specimen Description BLOOD LEFT HAND  Final   Special Requests BOTTLES DRAWN AEROBIC AND ANAEROBIC  [place]  Final   Culture  Setup Time   Final    GRAM POSITIVE COCCI ANAEROBIC BOTTLE ONLY CRITICAL RESULT CALLED TO, READ BACK BY AND VERIFIED WITH: NATE COOKSON ON 05/22/16 AT 0130 BY TLB CONFIRMED BY TLB/[facility]    Culture STREPTOCOCCUS AGALACTIAE (A)  Final   Report Status 05/24/2016 FINAL  Final   Organism ID, Bacteria STREPTOCOCCUS AGALACTIAE  Final      Susceptibility   Streptococcus agalactiae - MIC*     CLINDAMYCIN <=0.25 SENSITIVE Sensitive     AMPICILLIN <=0.25 SENSITIVE Sensitive     ERYTHROMYCIN 2 RESISTANT Resistant     VANCOMYCIN 0.5 SENSITIVE Sensitive     CEFTRIAXONE <=0.12 SENSITIVE Sensitive     LEVOFLOXACIN 0.5 SENSITIVE Sensitive     * STREPTOCOCCUS AGALACTIAE  Blood Culture ID Panel (Reflexed)     Status: Abnormal   Collection Time: 05/21/16 12:01 PM  Result Value Ref Range Status   Enterococcus species NOT DETECTED NOT DETECTED Final   Vancomycin resistance NOT DETECTED NOT DETECTED Final   Listeria monocytogenes NOT DETECTED NOT DETECTED Final   Staphylococcus species NOT DETECTED NOT DETECTED Final   Staphylococcus aureus NOT DETECTED NOT DETECTED Final   Methicillin resistance NOT DETECTED NOT DETECTED Final   Streptococcus species DETECTED (A) NOT DETECTED Final    Comment: CRITICAL RESULT CALLED TO, READ BACK BY AND VERIFIED WITH: NATE COOKSON ON 05/22/16 AT 0130 BY TLB    Streptococcus agalactiae DETECTED (A) NOT DETECTED Final    Comment: CRITICAL RESULT CALLED TO, READ BACK BY AND VERIFIED WITH: NATE COOKSON ON 05/22/16 AT 0130 BY TLB    Streptococcus pneumoniae NOT DETECTED NOT DETECTED Final   Streptococcus pyogenes NOT DETECTED NOT DETECTED Final   Acinetobacter baumannii NOT DETECTED NOT DETECTED Final   Enterobacteriaceae species NOT DETECTED NOT DETECTED Final   Enterobacter cloacae complex NOT DETECTED NOT DETECTED Final   Escherichia coli NOT DETECTED NOT DETECTED Final   Klebsiella oxytoca NOT DETECTED NOT DETECTED Final   Klebsiella pneumoniae NOT DETECTED NOT DETECTED Final   Proteus species NOT DETECTED NOT DETECTED Final   Serratia marcescens NOT DETECTED NOT DETECTED Final   Carbapenem resistance NOT DETECTED NOT DETECTED Final   Haemophilus influenzae NOT DETECTED NOT DETECTED Final   Neisseria meningitidis NOT DETECTED NOT DETECTED Final   Pseudomonas aeruginosa NOT DETECTED NOT DETECTED Final   Candida albicans NOT DETECTED NOT DETECTED  Final   Candida glabrata NOT DETECTED NOT DETECTED Final   Candida krusei NOT DETECTED NOT DETECTED Final   Candida parapsilosis NOT DETECTED NOT DETECTED Final   Candida tropicalis NOT DETECTED NOT DETECTED Final  Body fluid culture  Status: None   Collection Time: 05/21/16 10:31 PM  Result Value Ref Range Status   Specimen Description PERITONEAL  Final   Special Requests Normal  Final   Gram Stain NO WBC SEEN NO ORGANISMS SEEN   Final   Culture   Final    NO GROWTH 3 DAYS Performed at [facility]    Report Status 05/25/2016 FINAL  Final  CULTURE, BLOOD (ROUTINE X 2) w Reflex to ID Panel     Status: None (Preliminary result)   Collection Time: 05/24/16 10:10 AM  Result Value Ref Range Status   Specimen Description BLOOD RIGHT ASSIST CONTROL  Final   Special Requests BOTTLES DRAWN AEROBIC AND ANAEROBIC  8CC  Final   Culture NO GROWTH 3 DAYS  Final   Report Status PENDING  Incomplete  CULTURE, BLOOD (ROUTINE X 2) w Reflex to ID Panel     Status: None (Preliminary result)   Collection Time: 05/24/16 10:10 AM  Result Value Ref Range Status   Specimen Description BLOOD LEFT ASSIST CONTROL  Final   Special Requests BOTTLES DRAWN AEROBIC AND ANAEROBIC  [place]  Final   Culture NO GROWTH 3 DAYS  Final   Report Status PENDING  Incomplete  Urine culture     Status: Abnormal   Collection Time: 05/24/16 11:15 AM  Result Value Ref Range Status   Specimen Description URINE, CATHETERIZED  Final   Special Requests Normal  Final   Culture >=100,000 COLONIES/mL YEAST (A)  Final   Report Status 05/25/2016 FINAL  Final    Coagulation Studies: No results for input(s): LABPROT, INR in the last 72 hours.  Urinalysis: No results for input(s): COLORURINE, LABSPEC, PHURINE, GLUCOSEU, HGBUR, BILIRUBINUR, KETONESUR, PROTEINUR, UROBILINOGEN, NITRITE, LEUKOCYTESUR in the last 72 hours.  Invalid input(s): APPERANCEUR    Imaging: US Venous Img Upper Uni Right  Result Date:  05/25/2016 CLINICAL DATA:  Right upper extremity pain and edema for the past 3 weeks. Evaluate for DVT. EXAM: RIGHT UPPER EXTREMITY VENOUS DOPPLER ULTRASOUND TECHNIQUE: Gray-scale sonography with graded compression, as well as color Doppler and duplex ultrasound were performed to evaluate the upper extremity deep venous system from the level of the subclavian vein and including the jugular, axillary, basilic, radial, ulnar and upper cephalic vein. Spectral Doppler was utilized to evaluate flow at rest and with distal augmentation maneuvers. COMPARISON:  None. FINDINGS: Contralateral Subclavian Vein: Respiratory phasicity is normal and symmetric with the symptomatic side. No evidence of thrombus. Normal compressibility. Internal Jugular Vein: No evidence of thrombus. Normal compressibility, respiratory phasicity and response to augmentation. Subclavian Vein: No evidence of thrombus. Normal compressibility, respiratory phasicity and response to augmentation. Axillary Vein: No evidence of thrombus. Normal compressibility, respiratory phasicity and response to augmentation. Cephalic Vein: No evidence of thrombus. Normal compressibility, respiratory phasicity and response to augmentation. Basilic Vein: No evidence of thrombus. Normal compressibility, respiratory phasicity and response to augmentation. Brachial Veins: No evidence of thrombus. Normal compressibility, respiratory phasicity and response to augmentation. Radial Veins: No evidence of thrombus. Normal compressibility, respiratory phasicity and response to augmentation. Ulnar Veins: No evidence of thrombus. Normal compressibility, respiratory phasicity and response to augmentation. Venous Reflux:  None visualized. Other Findings:  None visualized. IMPRESSION: No evidence of DVT within the right upper extremity. Electronically Signed   By: Sandi Mariscal M.D.   On: 05/25/2016 15:51     Medications:     . allopurinol  100 mg Oral Daily  . aspirin EC  325 mg  Oral Daily  .  atenolol  25 mg Oral Daily  . cephALEXin  500 mg Oral Q12H  . cinacalcet  90 mg Oral Q lunch  . dialysis solution 1.5% low-MG/low-CA   Intraperitoneal Q24H  . epoetin (EPOGEN/PROCRIT) injection  20,000 Units Subcutaneous Weekly  . gabapentin  100 mg Oral TID  . gentamicin cream  1 application Topical Daily  . heparin  5,000 Units Subcutaneous Q8H  . insulin aspart  0-5 Units Subcutaneous QHS  . insulin aspart  0-9 Units Subcutaneous TID WC  . insulin glargine  42 Units Subcutaneous q morning - 10a  . isosorbide mononitrate  30 mg Oral BID  . latanoprost  1 drop Both Eyes QHS  . losartan  100 mg Oral Daily  . pravastatin  80 mg Oral QHS  . sevelamer carbonate  2,400 mg Oral TID WC   acetaminophen **OR** acetaminophen, ALPRAZolam, amitriptyline, cyclobenzaprine, fluticasone, heparin, HYDROcodone-acetaminophen, metoCLOPramide, ondansetron **OR** ondansetron (ZOFRAN) IV  Assessment/ Plan:  75 y.o. female end-stage renal disease, peritoneal dialysis, insulin-dependent diabetes, with complications of retinopathy, nephropathy, glaucoma, GERD, history of gout, hypertension, hyperlipidemia  1. End-stage renal disease We will continue her home doses regimen. 1700 cc 6 exchanges 2.5% and 1.5% PD fluid   2. Secondary hyperparathyroidism Phosphorus 6.2 Continue sevelamer home dose  3. Anemia of chronic kidney disease Continue SQ Procrit Iron acceptable Stool for guaic is negative. No evidence of active bleed. Patient received blood transfusion this admission  4. Hypoglycemia, Workup revealed Group B Strep bacteremia Patient received Cefazolin for treatment. Npw Cephalexin PD fluid cell count and culture are neg for peritonitis  5. Disposition Physical therapy recommends inpatient rehabilitation.  However discussed with patient that she would have to change modalities if she goes to rehabilitation.  Patient is not willing to do that at present.  It will need to be  reevaluated on Monday whether she can go home with home physical therapy.  If she ends up going for inpatient rehab, she will need a PermCath placement for temporarily changing modalities to hemodialysis.    LOS: 6 Laurie Steele 7/23/20171:54 PM

## 2016-05-28 LAB — GLUCOSE, CAPILLARY
GLUCOSE-CAPILLARY: 220 mg/dL — AB (ref 65–99)
GLUCOSE-CAPILLARY: 127 mg/dL — AB (ref 65–99)

## 2016-05-28 MED ORDER — INSULIN GLARGINE 100 UNIT/ML ~~LOC~~ SOLN: 42.0000 [IU] | Freq: Every day | SUBCUTANEOUS | 3 refills | Status: DC

## 2016-05-28 MED ORDER — INSULIN ASPART 100 UNIT/ML ~~LOC~~ SOLN: 0.0000 [IU] | Freq: Every day | SUBCUTANEOUS | 11 refills | Status: DC

## 2016-05-28 MED ORDER — CEPHALEXIN 500 MG PO CAPS: 500.0000 mg | ORAL_CAPSULE | Freq: Two times a day (BID) | ORAL | 0 refills | Status: DC

## 2016-05-28 NOTE — Care Management (Addendum)
Patient admitted with AMS.  Patient lives at home with her son. Patient has a specialty bed at home (it is not a hospital bed), has a RW, and cane.  States that her BR is is very close by to her bed and she will be able to ambulate to it post discharge.  Son [name] provides transportation to followup appointments.  Obtains medications from [facility].  PT has assessed patient and recommend SNF.  Patient had accepted a bed at [facility], however today patient and son [name] decline SNF and want to return home. See Marlyn Rockett's note.  Patient to discharge with services through [facility].  Corene Cornea with [facility] notified of discharge.  [name] to pick patient up at discharge today RNcm signing off

## 2016-05-28 NOTE — Discharge Instructions (Signed)
Keep log of your sugars at home °

## 2016-05-28 NOTE — Care Management Note (Signed)
Case Management Note  Patient Details  Name: Laurie Steele MRN: [id number] Date of Birth: [birth year]  Subjective/Objective:    Discussed discharge planning with [name]. No family in room. Reviewed the list of home health providers with Mrs Tennis Must and she chose [facility]. For RN and PT. Advised [name] that someone from [facility] would telephone her within 48 hours to schedule appointments with her for the RN and Pt to visit her. [name] verbalized understanding and agreed with this plan.                  Action/Plan:   Expected Discharge Date:                  Expected Discharge Plan:     In-House Referral:     Discharge planning Services     Post Acute Care Choice:    Choice offered to:     DME Arranged:    DME Agency:     HH Arranged:    HH Agency:     Status of Service:     If discussed at [employer] of Stay Meetings, dates discussed:    Additional Comments:  Shacarra Choe A, RN 05/28/2016, 1:05 PM

## 2016-05-28 NOTE — Progress Notes (Signed)
Subjective:  Patient overall appears to be still quite weak. Reporting some pain in the right elbow where IV is. No sign of infiltration. Continues on peritoneal dialysis. Family would like to take the patient home.     Objective:  Vital signs in last 24 hours:  Temp:  [98 F (36.7 C)-98.3 F (36.8 C)] 98.2 F (36.8 C) (07/24 1313) Pulse Rate:  [58-61] 59 (07/24 1313) Resp:  [16-19] 19 (07/24 1313) BP: (132-145)/(56-80) 132/75 (07/24 1313) SpO2:  [97 %-100 %] 98 % (07/24 1313) Weight:  [100.5 kg (221 lb 9 oz)] 100.5 kg (221 lb 9 oz) (07/24 0800)  Weight change:  Filed Weights   05/27/16 0500 05/27/16 0517 05/28/16 0800  Weight: 104 kg (229 lb 4.5 oz) 104 kg (229 lb 4.5 oz) 100.5 kg (221 lb 9 oz)    Intake/Output:    Intake/Output Summary (Last 24 hours) at 05/28/16 1328 Last data filed at 05/28/16 0900  Gross per 24 hour  Intake              360 ml  Output             1422 ml  Net            -1062 ml     Physical Exam: General: No acute distress, laying in bed.  HEENT Anicteric, moist mucous membranes   Neck Supple   Pulm/lungs Lungs are clear to auscultation bilaterally, normal breathing effort   CVS/Heart Regular rhythm, no rub or gallop   Abdomen:  Soft, nontender, bowel sounds present  Extremities: Trace peripheral edema.    Neurologic: Alert and oriented, follows commands  Skin: No acute rashes   Access: PD catheter        Basic Metabolic Panel:   Recent Labs Lab 05/22/16 0444 05/23/16 0528 05/25/16 0416  NA 137 137  --   K 4.6 3.8  --   CL 98* 97*  --   CO2 23 24  --   GLUCOSE 402* 187*  --   BUN 96* 98*  --   CREATININE 12.82* 12.27*  --   CALCIUM 7.9* 8.0*  --   PHOS  --   --  6.2*     CBC:  Recent Labs Lab 05/22/16 0444 05/23/16 0528 05/24/16 0435 05/25/16 0416 05/27/16 1126  WBC 12.5* 13.5* 15.0* 13.4* 13.6*  HGB 6.9* 6.1* 7.7* 7.4* 7.1*  HCT 21.1* 17.9* 22.9* 22.0* 21.0*  MCV 104.4* 104.2* 98.4 97.6 98.1  PLT 186 168  179 176 154      Microbiology:  Recent Results (from the past 720 hour(s))  Surgical pcr screen     Status: None   Collection Time: 05/15/16  2:16 PM  Result Value Ref Range Status   MRSA, PCR NEGATIVE NEGATIVE Final   Staphylococcus aureus NEGATIVE NEGATIVE Final    Comment:        The Xpert SA Assay (FDA approved for NASAL specimens in patients over 46 years of age), is one component of a comprehensive surveillance program.  Test performance has been validated by [facility] for patients greater than or equal to 69 year old. It is not intended to diagnose infection nor to guide or monitor treatment.   Blood culture (routine x 2)     Status: Abnormal   Collection Time: 05/21/16 12:01 PM  Result Value Ref Range Status   Specimen Description BLOOD RIGHT ANTECUBITAL  Final   Special Requests BOTTLES DRAWN AEROBIC AND ANAEROBIC 4CC  Final  Culture  Setup Time   Final    GRAM POSITIVE COCCI ANAEROBIC BOTTLE ONLY CRITICAL RESULT CALLED TO, READ BACK BY AND VERIFIED WITH: NATE COOKSON 05/22/16 0130 TLB/SGD    Culture (A)  Final    STREPTOCOCCUS AGALACTIAE SUSCEPTIBILITIES PERFORMED ON PREVIOUS CULTURE WITHIN THE LAST 5 DAYS. Performed at [facility]    Report Status 05/24/2016 FINAL  Final  Blood culture (routine x 2)     Status: Abnormal   Collection Time: 05/21/16 12:01 PM  Result Value Ref Range Status   Specimen Description BLOOD LEFT HAND  Final   Special Requests BOTTLES DRAWN AEROBIC AND ANAEROBIC  [place]  Final   Culture  Setup Time   Final    GRAM POSITIVE COCCI ANAEROBIC BOTTLE ONLY CRITICAL RESULT CALLED TO, READ BACK BY AND VERIFIED WITH: NATE COOKSON ON 05/22/16 AT 0130 BY TLB CONFIRMED BY TLB/KB[facility]    Culture STREPTOCOCCUS AGALACTIAE (A)  Final   Report Status 05/24/2016 FINAL  Final   Organism ID, Bacteria STREPTOCOCCUS AGALACTIAE  Final      Susceptibility   Streptococcus agalactiae - MIC*    CLINDAMYCIN <=0.25 SENSITIVE Sensitive     AMPICILLIN  <=0.25 SENSITIVE Sensitive     ERYTHROMYCIN 2 RESISTANT Resistant     VANCOMYCIN 0.5 SENSITIVE Sensitive     CEFTRIAXONE <=0.12 SENSITIVE Sensitive     LEVOFLOXACIN 0.5 SENSITIVE Sensitive     * STREPTOCOCCUS AGALACTIAE  Blood Culture ID Panel (Reflexed)     Status: Abnormal   Collection Time: 05/21/16 12:01 PM  Result Value Ref Range Status   Enterococcus species NOT DETECTED NOT DETECTED Final   Vancomycin resistance NOT DETECTED NOT DETECTED Final   Listeria monocytogenes NOT DETECTED NOT DETECTED Final   Staphylococcus species NOT DETECTED NOT DETECTED Final   Staphylococcus aureus NOT DETECTED NOT DETECTED Final   Methicillin resistance NOT DETECTED NOT DETECTED Final   Streptococcus species DETECTED (A) NOT DETECTED Final    Comment: CRITICAL RESULT CALLED TO, READ BACK BY AND VERIFIED WITH: NATE COOKSON ON 05/22/16 AT 0130 BY TLB    Streptococcus agalactiae DETECTED (A) NOT DETECTED Final    Comment: CRITICAL RESULT CALLED TO, READ BACK BY AND VERIFIED WITH: NATE COOKSON ON 05/22/16 AT 0130 BY TLB    Streptococcus pneumoniae NOT DETECTED NOT DETECTED Final   Streptococcus pyogenes NOT DETECTED NOT DETECTED Final   Acinetobacter baumannii NOT DETECTED NOT DETECTED Final   Enterobacteriaceae species NOT DETECTED NOT DETECTED Final   Enterobacter cloacae complex NOT DETECTED NOT DETECTED Final   Escherichia coli NOT DETECTED NOT DETECTED Final   Klebsiella oxytoca NOT DETECTED NOT DETECTED Final   Klebsiella pneumoniae NOT DETECTED NOT DETECTED Final   Proteus species NOT DETECTED NOT DETECTED Final   Serratia marcescens NOT DETECTED NOT DETECTED Final   Carbapenem resistance NOT DETECTED NOT DETECTED Final   Haemophilus influenzae NOT DETECTED NOT DETECTED Final   Neisseria meningitidis NOT DETECTED NOT DETECTED Final   Pseudomonas aeruginosa NOT DETECTED NOT DETECTED Final   Candida albicans NOT DETECTED NOT DETECTED Final   Candida glabrata NOT DETECTED NOT DETECTED Final    Candida krusei NOT DETECTED NOT DETECTED Final   Candida parapsilosis NOT DETECTED NOT DETECTED Final   Candida tropicalis NOT DETECTED NOT DETECTED Final  Body fluid culture     Status: None   Collection Time: 05/21/16 10:31 PM  Result Value Ref Range Status   Specimen Description PERITONEAL  Final   Special Requests Normal  Final  Gram Stain NO WBC SEEN NO ORGANISMS SEEN   Final   Culture   Final    NO GROWTH 3 DAYS Performed at [facility]    Report Status 05/25/2016 FINAL  Final  CULTURE, BLOOD (ROUTINE X 2) w Reflex to ID Panel     Status: None (Preliminary result)   Collection Time: 05/24/16 10:10 AM  Result Value Ref Range Status   Specimen Description BLOOD RIGHT ASSIST CONTROL  Final   Special Requests BOTTLES DRAWN AEROBIC AND ANAEROBIC  8CC  Final   Culture NO GROWTH 4 DAYS  Final   Report Status PENDING  Incomplete  CULTURE, BLOOD (ROUTINE X 2) w Reflex to ID Panel     Status: None (Preliminary result)   Collection Time: 05/24/16 10:10 AM  Result Value Ref Range Status   Specimen Description BLOOD LEFT ASSIST CONTROL  Final   Special Requests BOTTLES DRAWN AEROBIC AND ANAEROBIC  [place]  Final   Culture NO GROWTH 4 DAYS  Final   Report Status PENDING  Incomplete  Urine culture     Status: Abnormal   Collection Time: 05/24/16 11:15 AM  Result Value Ref Range Status   Specimen Description URINE, CATHETERIZED  Final   Special Requests Normal  Final   Culture >=100,000 COLONIES/mL YEAST (A)  Final   Report Status 05/25/2016 FINAL  Final    Coagulation Studies: No results for input(s): LABPROT, INR in the last 72 hours.  Urinalysis: No results for input(s): COLORURINE, LABSPEC, PHURINE, GLUCOSEU, HGBUR, BILIRUBINUR, KETONESUR, PROTEINUR, UROBILINOGEN, NITRITE, LEUKOCYTESUR in the last 72 hours.  Invalid input(s): APPERANCEUR    Imaging: No results found.   Medications:     . allopurinol  100 mg Oral Daily  . aspirin EC  325 mg Oral Daily  .  atenolol  25 mg Oral Daily  . cephALEXin  500 mg Oral Q12H  . cinacalcet  90 mg Oral Q lunch  . dialysis solution 1.5% low-MG/low-CA   Intraperitoneal Q24H  . epoetin (EPOGEN/PROCRIT) injection  20,000 Units Subcutaneous Weekly  . gabapentin  100 mg Oral TID  . gentamicin cream  1 application Topical Daily  . heparin  5,000 Units Subcutaneous Q8H  . insulin aspart  0-5 Units Subcutaneous QHS  . insulin aspart  0-9 Units Subcutaneous TID WC  . insulin glargine  42 Units Subcutaneous q morning - 10a  . isosorbide mononitrate  30 mg Oral BID  . latanoprost  1 drop Both Eyes QHS  . losartan  100 mg Oral Daily  . pravastatin  80 mg Oral QHS  . sevelamer carbonate  2,400 mg Oral TID WC   acetaminophen **OR** acetaminophen, ALPRAZolam, amitriptyline, cyclobenzaprine, fluticasone, heparin, HYDROcodone-acetaminophen, metoCLOPramide, ondansetron **OR** ondansetron (ZOFRAN) IV  Assessment/ Plan:  75 y.o. female end-stage renal disease, peritoneal dialysis, insulin-dependent diabetes, with complications of retinopathy, nephropathy, glaucoma, GERD, history of gout, hypertension, hyperlipidemia  1. End-stage renal disease We will continue her home peritoneal dialysis prescription. 1700 cc 6 exchanges 2.5% and 1.5% PD fluid   2. Secondary hyperparathyroidism Continue to periodically monitor phosphorus.  Continue Renvela for now.  3. Anemia of chronic kidney disease Hemoglobin currently 7.1.  Continued her blood transfusion as the patient remains quite weak.  4. Hypoglycemia, Workup revealed Group B Strep bacteremia Patient received Cefazolin for treatment. Now Cephalexin PD fluid cell count and culture are neg for peritonitis  5. Disposition - discussed with care management, the patient, and family.  Family would like to take the patient home.  Patient states that she would like to stay on peritoneal dialysis for now.  We did counsel the patient that she may need to consider hemodialysis if  she is unable to perform peritoneal dialysis herself.    LOS: 7 Teryn Boerema 7/24/20171:28 PM

## 2016-05-28 NOTE — Progress Notes (Signed)
Physical Therapy Treatment Patient Details Name: Laurie Steele MRN: [id number] DOB: 07/03/1941 Today's Date: 05/28/2016    History of Present Illness Pt is a 75 yr old female with past history of IDDM, ESRD on peritoneal dialysis, HTN, glaucoma, retinopathy, peripheral neuropathy, h/o gout, HLD, and chronic anemia who presents to the hospital due to [place] and admitted with hypoglycemia and hypothermia.     PT Comments    Received order for PT re-evaluation, as pt's family would like to take pt home. Pt did show improvement with mobility this session, but is still requiring physical assist. Min A for sit <> stand transfers with RW and CGA for ambulation x39ft with RW and close chair follow (distance limited by fatigue). Pt ambulates at a gait speed of 0.35 ft/sec, which is well below age-matched norms and places her as at an increased risk for falls.  Recommend SNF versus home with 24/7 supervision/assist.   Follow Up Recommendations  SNF;Supervision/Assistance - 24 hour     Equipment Recommendations   (pt has rollator walker at home)    Recommendations for Other Services       Precautions / Restrictions Precautions Precautions: Fall Precaution Comments: Peritoneal dialysis port; gait belt high Restrictions Weight Bearing Restrictions: No    Mobility  Bed Mobility General bed mobility comments: Received in chair, returned to chair; not assessed  Transfers Overall transfer level: Needs assistance Equipment used: Rolling walker (2 wheeled) Transfers: Sit to/from Stand Sit to Stand: Min assist General transfer comment: Vc's for technique and DME use. Increased time required. Considerable effort required.   Ambulation/Gait Ambulation/Gait assistance: Min guard;+2 safety/equipment (close chair follow) Ambulation Distance (Feet): 50 Feet Assistive device: Rolling walker (2 wheeled) Gait Pattern/deviations: Step-to pattern;Decreased step length - right;Decreased step length -  left;Narrow base of support Gait velocity: 0.35 ft/sec Gait velocity interpretation:  (Well below normal speed for age/gender) General Gait Details: Heavy BUE assist on RW. Very slow and effortful steps. Distance limited by fatigue. No LOB.   Stairs    Wheelchair Mobility    Modified Rankin (Stroke Patients Only)          Cognition Arousal/Alertness: Awake/alert Behavior During Therapy: Flat affect Overall Cognitive Status: Within Functional Limits for tasks assessed    Exercises General Exercises - Lower Extremity Ankle Circles/Pumps: AROM Quad Sets: AROM Short Arc Quad: AROM Heel Slides: AROM Hip ABduction/ADduction: AROM    General Comments General comments (skin integrity, edema, etc.): Dialysis port in tact L abdomen.      Pertinent Vitals/Pain Pain Assessment: 0-10 Pain Score: 3  Pain Location: RUE Pain Descriptors / Indicators: Aching;Constant Pain Intervention(s): Limited activity within patient's tolerance;Monitored during session  HR and O2 monitored throughout session and maintained WFL.            PT Goals (current goals can now be found in the care plan section) Acute Rehab PT Goals Patient Stated Goal: To get stronger PT Goal Formulation: With patient Time For Goal Achievement: 06/07/16 Potential to Achieve Goals: Good Progress towards PT goals: Progressing toward goals    Frequency  Min 2X/week    PT Plan Current plan remains appropriate       End of Session Equipment Utilized During Treatment: Gait belt (high) Activity Tolerance: Patient limited by fatigue Patient left: in chair;with call bell/phone within reach;with chair alarm set     Time: [id number] PT Time Calculation (min) (ACUTE ONLY): 23 min  Charges:  G Codes:      Adysson Revelle, SPT 05/28/2016, 1:37 PM

## 2016-05-28 NOTE — Progress Notes (Signed)
Patient discharged home with family.  [facility].  All discharge instructions reviewed and discharge paperwork given to patient.  Patient verbalized understanding.  IV removed in tact. All questions and concerns addressed. Patient's son here for transfer home.

## 2016-05-28 NOTE — Discharge Summary (Signed)
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 Chronic anemia    Dr Inez Pilgrim  . GERD (gastroesophageal reflux disease)   . Glaucoma   . Gout   . Hx of colonic polyps   . Hyperlipidemia   . Hypertension   . Lower back pain   . Miscarriage   . 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 Peritoneal dialysis status ([place])   . Renal failure    Dr Holley Raring  . 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 Vaginal delivery    x 4    HOSPITAL COURSE:  75 year old female with past history of diabetes insulin-dependent, end-stage renal disease on peritoneal dialysis, essential hypertension, glaucoma, history of gout, hyperlipidemia who presents to the hospital due to altered mental status and noted to be hypoglycemic and also hypothermic.  1. Altered mental status-metabolic encephalopathy secondary to the hypothermia and hypoglycemia. -Hypoglycemia, hypothermia now resolved. Mental status improved and back to baseline.  2. Hypoglycemia-this is likely secondary to sepsis. Now resolved.  3. Hypothermia-this is secondary to sepsis. Now resolved.  4. Sepsis-patient's blood cultures positive for group B strep. Discussed with infectious disease by Dr.Sainani,and likely source is probably urinary. Urine cultures although growing yeast. -Repeat blood cultures from yesterday are  negative.  -cont keflex  5. Anemia of chronic disease-Likely secondary to end-stage renal disease. -Improved with transfusion will follow serial counts. Hemoccult negative.  6. End-stage renal disease on peritoneal dialysis-nephrology following the patient and continue dialysis as per them.  7. Essential hypertension-continue atenolol, amlodipine, clonidine, hydralazine, losartan.  8. Secondary hyperparathyroidism-continue Sensipar, Renvela.  9. Diabetic neuropathy-continue gabapentin.  10. Hyperlipidemia-continue Pravachol.  11. History of gout-  Acute;attack of poly articular gout;finished prednisone,  12. Right upper Ext. Swelling pain -  Negative for DVT. Use heating pad.  Physical therapy recommended SNF placement. But patient needs to be on HD, hemodialysis access for her to go for her to go to rehabilitation. -pt and his son wants pt to go home Will arrange [facility]  CONSULTS OBTAINED:  Treatment Team:  Murlean Iba, MD  DRUG ALLERGIES:  No Known Allergies  DISCHARGE MEDICATIONS:   Current Discharge Medication List    START taking these medications   Details  cephALEXin (KEFLEX) 500 MG capsule Take 1 capsule (500 mg total) by mouth every 12 (twelve) hours. Qty: 8 capsule, Refills: 0    insulin aspart (NOVOLOG) 100 UNIT/ML injection Inject 0-5 Units into the skin at bedtime. Qty: 10 mL, Refills: 11      CONTINUE these medications which have CHANGED   Details  insulin glargine (LANTUS) 100 UNIT/ML injection Inject 0.42 mLs (42 Units total) into the skin at bedtime. Qty: 7 vial, Refills: 3      CONTINUE these medications which have NOT CHANGED   Details  allopurinol (ZYLOPRIM) 100 MG tablet Take 1 tablet by mouth daily.     ALPRAZolam Duanne Moron)  0.25 MG tablet Take 1 tablet by mouth at bedtime as needed for sleep.     amitriptyline (ELAVIL) 25 MG tablet Take 1 tablet by mouth at bedtime as needed (For foot pain).  Refills: 3    amLODipine (NORVASC) 10 MG  tablet Take 10 mg by mouth daily. Take it mid-day Refills: 11    aspirin EC 325 MG tablet Take 325 mg by mouth daily.    atenolol (TENORMIN) 25 MG tablet Take 1 tablet by mouth daily.    B Complex-C-Folic Acid (DIALYVITE Q000111Q PO) Take 1 tablet by mouth daily.    cinacalcet (SENSIPAR) 90 MG tablet Take 90 mg by mouth daily. With lunch    cloNIDine (CATAPRES) 0.3 MG tablet Take 0.3 mg by mouth 2 (two) times daily.    cyclobenzaprine (FLEXERIL) 5 MG tablet Take 1 tablet by mouth 3 (three) times daily as needed for muscle spasms.     fluticasone (FLONASE) 50 MCG/ACT nasal spray Place 2 sprays into both nostrils daily as needed for allergies.     gabapentin (NEURONTIN) 100 MG capsule Take 1 capsule by mouth 3 (three) times daily.    hydrALAZINE (APRESOLINE) 50 MG tablet Take 50 mg by mouth 3 (three) times daily.      HYDROcodone-acetaminophen (NORCO/VICODIN) 5-325 MG tablet Take 1 tablet by mouth 2 (two) times daily as needed for moderate pain.     ipratropium (ATROVENT) 0.03 % nasal spray Place 1 spray into both nostrils 3 (three) times daily as needed for rhinitis.    isosorbide mononitrate (IMDUR) 30 MG 24 hr tablet Take 30 mg by mouth 2 (two) times daily.    latanoprost (XALATAN) 0.005 % ophthalmic solution Place 1 drop into both eyes at bedtime. Refills: 6    losartan (COZAAR) 100 MG tablet Take 100 mg by mouth daily. Refills: 11    metoCLOPramide (REGLAN) 10 MG tablet Take 10 mg by mouth 3 (three) times daily as needed for nausea.     pravastatin (PRAVACHOL) 80 MG tablet Take 1 tablet (80 mg total) by mouth daily. Qty: 90 tablet, Refills: 3    predniSONE (DELTASONE) 10 MG tablet Take 10-60 mg by mouth daily. 60 mg daily on day 1, then 50 mg daily on day 2, then 40 mg daily on day 3, then 30 mg daily on day 4, then 20 mg daily on day 5, then 10 mg daily on day 6, then stop    sevelamer (RENVELA) 800 MG tablet Take 2,400 mg by mouth 3 (three) times daily with meals.        STOP taking these medications     HUMALOG 100 UNIT/ML injection         If you experience worsening of your admission symptoms, develop shortness of breath, life threatening emergency, suicidal or homicidal thoughts you must seek medical attention immediately by calling 911 or calling your MD immediately  if symptoms less severe.  You Must read complete instructions/literature along with all the possible adverse reactions/side effects for all the Medicines you take and that have been prescribed to you. Take any new Medicines after you have completely understood and accept all the possible adverse reactions/side effects.   Please note  You were cared for by a hospitalist during your hospital stay. If you have any questions about your discharge medications or the care you received while you were in the hospital after you are discharged, you can call the unit and asked to speak with the hospitalist on call if the  hospitalist that took care of you is not available. Once you are discharged, your primary care physician will handle any further medical issues. Please note that NO REFILLS for any discharge medications will be authorized once you are discharged, as it is imperative that you return to your primary care physician (or establish a relationship with a primary care physician if you do not have one) for your aftercare needs so that they can reassess your need for medications and monitor your lab values. Today   SUBJECTIVE   No complaints  VITAL SIGNS:  Blood pressure 136/80, pulse (!) 58, temperature 98.3 F (36.8 C), temperature source Oral, resp. rate 18, height 5\' 7"  (1.702 m), weight 100.5 kg (221 lb 9 oz), SpO2 100 %.  I/O:   Intake/Output Summary (Last 24 hours) at 05/28/16 1254 Last data filed at 05/28/16 0900  Gross per 24 hour  Intake              360 ml  Output              618 ml  Net             -258 ml    PHYSICAL EXAMINATION:  GENERAL:  75 y.o.-year-old patient  lying in the bed with no acute distress.  EYES: Pupils equal, round, reactive to light and accommodation. No scleral icterus. Extraocular muscles intact.  HEENT: Head atraumatic, normocephalic. Oropharynx and nasopharynx clear.  NECK:  Supple, no jugular venous distention. No thyroid enlargement, no tenderness.  LUNGS: Normal breath sounds bilaterally, no wheezing, rales,rhonchi or crepitation. No use of accessory muscles of respiration.  CARDIOVASCULAR: S1, S2 normal. No murmurs, rubs, or gallops.  ABDOMEN: Soft, non-tender, non-distended. Bowel sounds present. No organomegaly or mass. PD cath+ EXTREMITIES: No pedal edema, cyanosis, or clubbing.  NEUROLOGIC: Cranial nerves II through XII are intact. Muscle strength 5/5 in all extremities. Sensation intact. Gait not checked.  PSYCHIATRIC: The patient is alert and oriented x 3.  SKIN: No obvious rash, lesion, or ulcer.   DATA REVIEW:   CBC   Recent Labs Lab 05/27/16 1126  WBC 13.6*  HGB 7.1*  HCT 21.0*  PLT 154    Chemistries   Recent Labs Lab 05/23/16 0528  NA 137  K 3.8  CL 97*  CO2 24  GLUCOSE 187*  BUN 98*  CREATININE 12.27*  CALCIUM 8.0*    Microbiology Results   Recent Results (from the past 240 hour(s))  Blood culture (routine x 2)     Status: Abnormal   Collection Time: 05/21/16 12:01 PM  Result Value Ref Range Status   Specimen Description BLOOD RIGHT ANTECUBITAL  Final   Special Requests BOTTLES DRAWN AEROBIC AND ANAEROBIC 4CC  Final   Culture  Setup Time   Final    GRAM POSITIVE COCCI ANAEROBIC BOTTLE ONLY CRITICAL RESULT CALLED TO, READ BACK BY AND VERIFIED WITH: NATE COOKSON 05/22/16 0130 TLB/SGD    Culture (A)  Final    STREPTOCOCCUS AGALACTIAE SUSCEPTIBILITIES PERFORMED ON PREVIOUS CULTURE WITHIN THE LAST 5 DAYS. Performed at [facility]    Report Status 05/24/2016 FINAL  Final  Blood culture (routine x 2)     Status: Abnormal   Collection Time: 05/21/16 12:01 PM  Result Value Ref  Range Status   Specimen Description BLOOD LEFT HAND  Final   Special Requests BOTTLES DRAWN AEROBIC AND ANAEROBIC  [place]  Final   Culture  Setup Time   Final    GRAM POSITIVE COCCI ANAEROBIC  BOTTLE ONLY CRITICAL RESULT CALLED TO, READ BACK BY AND VERIFIED WITH: NATE COOKSON ON 05/22/16 AT 0130 BY TLB CONFIRMED BY TLB/KBH    Culture STREPTOCOCCUS AGALACTIAE (A)  Final   Report Status 05/24/2016 FINAL  Final   Organism ID, Bacteria STREPTOCOCCUS AGALACTIAE  Final      Susceptibility   Streptococcus agalactiae - MIC*    CLINDAMYCIN <=0.25 SENSITIVE Sensitive     AMPICILLIN <=0.25 SENSITIVE Sensitive     ERYTHROMYCIN 2 RESISTANT Resistant     VANCOMYCIN 0.5 SENSITIVE Sensitive     CEFTRIAXONE <=0.12 SENSITIVE Sensitive     LEVOFLOXACIN 0.5 SENSITIVE Sensitive     * STREPTOCOCCUS AGALACTIAE  Blood Culture ID Panel (Reflexed)     Status: Abnormal   Collection Time: 05/21/16 12:01 PM  Result Value Ref Range Status   Enterococcus species NOT DETECTED NOT DETECTED Final   Vancomycin resistance NOT DETECTED NOT DETECTED Final   Listeria monocytogenes NOT DETECTED NOT DETECTED Final   Staphylococcus species NOT DETECTED NOT DETECTED Final   Staphylococcus aureus NOT DETECTED NOT DETECTED Final   Methicillin resistance NOT DETECTED NOT DETECTED Final   Streptococcus species DETECTED (A) NOT DETECTED Final    Comment: CRITICAL RESULT CALLED TO, READ BACK BY AND VERIFIED WITH: NATE COOKSON ON 05/22/16 AT 0130 BY TLB    Streptococcus agalactiae DETECTED (A) NOT DETECTED Final    Comment: CRITICAL RESULT CALLED TO, READ BACK BY AND VERIFIED WITH: NATE COOKSON ON 05/22/16 AT 0130 BY TLB    Streptococcus pneumoniae NOT DETECTED NOT DETECTED Final   Streptococcus pyogenes NOT DETECTED NOT DETECTED Final   Acinetobacter baumannii NOT DETECTED NOT DETECTED Final   Enterobacteriaceae species NOT DETECTED NOT DETECTED Final   Enterobacter cloacae complex NOT DETECTED NOT DETECTED Final   Escherichia  coli NOT DETECTED NOT DETECTED Final   Klebsiella oxytoca NOT DETECTED NOT DETECTED Final   Klebsiella pneumoniae NOT DETECTED NOT DETECTED Final   Proteus species NOT DETECTED NOT DETECTED Final   Serratia marcescens NOT DETECTED NOT DETECTED Final   Carbapenem resistance NOT DETECTED NOT DETECTED Final   Haemophilus influenzae NOT DETECTED NOT DETECTED Final   Neisseria meningitidis NOT DETECTED NOT DETECTED Final   Pseudomonas aeruginosa NOT DETECTED NOT DETECTED Final   Candida albicans NOT DETECTED NOT DETECTED Final   Candida glabrata NOT DETECTED NOT DETECTED Final   Candida krusei NOT DETECTED NOT DETECTED Final   Candida parapsilosis NOT DETECTED NOT DETECTED Final   Candida tropicalis NOT DETECTED NOT DETECTED Final  Body fluid culture     Status: None   Collection Time: 05/21/16 10:31 PM  Result Value Ref Range Status   Specimen Description PERITONEAL  Final   Special Requests Normal  Final   Gram Stain NO WBC SEEN NO ORGANISMS SEEN   Final   Culture   Final    NO GROWTH 3 DAYS Performed at [facility]    Report Status 05/25/2016 FINAL  Final  CULTURE, BLOOD (ROUTINE X 2) w Reflex to ID Panel     Status: None (Preliminary result)   Collection Time: 05/24/16 10:10 AM  Result Value Ref Range Status   Specimen Description BLOOD RIGHT ASSIST CONTROL  Final   Special Requests BOTTLES DRAWN AEROBIC AND ANAEROBIC  8CC  Final   Culture NO GROWTH 4 DAYS  Final   Report Status PENDING  Incomplete  CULTURE, BLOOD (ROUTINE X 2) w Reflex to ID Panel     Status: None (Preliminary result)  Collection Time: 05/24/16 10:10 AM  Result Value Ref Range Status   Specimen Description BLOOD LEFT ASSIST CONTROL  Final   Special Requests BOTTLES DRAWN AEROBIC AND ANAEROBIC  [place]  Final   Culture NO GROWTH 4 DAYS  Final   Report Status PENDING  Incomplete  Urine culture     Status: Abnormal   Collection Time: 05/24/16 11:15 AM  Result Value Ref Range Status   Specimen Description  URINE, CATHETERIZED  Final   Special Requests Normal  Final   Culture >=100,000 COLONIES/mL YEAST (A)  Final   Report Status 05/25/2016 FINAL  Final    RADIOLOGY:  No results found.   Management plans discussed with the patient, family and they are in agreement.  CODE STATUS:     Code Status Orders        Start     Ordered   05/21/16 1629  Full code  Continuous     05/21/16 1628    Code Status History    Date Active Date Inactive Code Status Order ID Comments User Context   04/05/2016  4:14 PM 04/06/2016  9:07 PM Full Code [id number]  Loletha Grayer, MD ED   03/14/2016 11:50 PM 03/16/2016  3:34 PM Full Code [id number]  Max Sane, MD Inpatient      TOTAL TIME TAKING CARE OF THIS PATIENT: 40 minutes.    Gearlene Godsil M.D on 05/28/2016 at 12:54 PM  Between 7am to 6pm - Pager - [phone] After 6pm go to [email or web address] - password [email or web address] [facility]  Office  [phone]  CC: Primary care physician; [name], MD

## 2016-05-28 NOTE — Clinical Social Work Note (Signed)
CSW is being told this morning that patient's son is in the room and stating he will take the patient home rather than patient going to rehab. RN CM to speak with him and patient. CSW will facilitate discharge plans if patient decides to go to [facility]. Shela Leff MSW,LCSW [phone]

## 2016-05-29 LAB — CULTURE, BLOOD (ROUTINE X 2)
Culture: NO GROWTH
Culture: NO GROWTH

## 2016-06-11 ENCOUNTER — Encounter: Payer: Self-pay | Admitting: Emergency Medicine

## 2016-06-11 ENCOUNTER — Emergency Department
Admission: EM | Admit: 2016-06-11 | Discharge: 2016-06-11 | Disposition: A | Discharge status: Home / Self Care | Payer: Medicare Other | Attending: Emergency Medicine | Admitting: Emergency Medicine

## 2016-06-11 DIAGNOSIS — R0789 Other chest pain: Secondary | ICD-10-CM | POA: Diagnosis present

## 2016-06-11 DIAGNOSIS — E1122 Type 2 diabetes mellitus with diabetic chronic kidney disease: Secondary | ICD-10-CM | POA: Diagnosis not present

## 2016-06-11 DIAGNOSIS — R202 Paresthesia of skin: Secondary | ICD-10-CM | POA: Diagnosis not present

## 2016-06-11 DIAGNOSIS — Z87891 Personal history of nicotine dependence: Secondary | ICD-10-CM | POA: Diagnosis not present

## 2016-06-11 DIAGNOSIS — Z79899 Other long term (current) drug therapy: Secondary | ICD-10-CM | POA: Diagnosis not present

## 2016-06-11 DIAGNOSIS — Z7982 Long term (current) use of aspirin: Secondary | ICD-10-CM | POA: Insufficient documentation

## 2016-06-11 DIAGNOSIS — Z794 Long term (current) use of insulin: Secondary | ICD-10-CM | POA: Insufficient documentation

## 2016-06-11 DIAGNOSIS — I120 Hypertensive chronic kidney disease with stage 5 chronic kidney disease or end stage renal disease: Secondary | ICD-10-CM | POA: Diagnosis not present

## 2016-06-11 DIAGNOSIS — N186 End stage renal disease: Secondary | ICD-10-CM | POA: Insufficient documentation

## 2016-06-11 DIAGNOSIS — R079 Chest pain, unspecified: Secondary | ICD-10-CM

## 2016-06-11 LAB — CBC WITH DIFFERENTIAL/PLATELET
BASOS ABS: 0 10*3/uL (ref 0–0.1)
BASOS PCT: 1 %
EOS PCT: 2 %
Eosinophils Absolute: 0.1 10*3/uL (ref 0–0.7)
HCT: 18.9 % — ABNORMAL LOW (ref 35.0–47.0)
Hemoglobin: 6.5 g/dL — ABNORMAL LOW (ref 12.0–16.0)
Lymphocytes Relative: 21 %
Lymphs Abs: 1.8 10*3/uL (ref 1.0–3.6)
MCH: 34.5 pg — ABNORMAL HIGH (ref 26.0–34.0)
MCHC: 34.5 g/dL (ref 32.0–36.0)
MCV: 100.1 fL — ABNORMAL HIGH (ref 80.0–100.0)
MONO ABS: 0.6 10*3/uL (ref 0.2–0.9)
Monocytes Relative: 7 %
Neutro Abs: 6.2 10*3/uL (ref 1.4–6.5)
Neutrophils Relative %: 69 %
PLATELETS: 201 10*3/uL (ref 150–440)
RBC: 1.88 MIL/uL — AB (ref 3.80–5.20)
RDW: 22 % — AB (ref 11.5–14.5)
WBC: 8.8 10*3/uL (ref 3.6–11.0)

## 2016-06-11 LAB — COMPREHENSIVE METABOLIC PANEL
ALBUMIN: 2.7 g/dL — AB (ref 3.5–5.0)
ALK PHOS: 103 U/L (ref 38–126)
ALT: 27 U/L (ref 14–54)
ANION GAP: 17 — AB (ref 5–15)
AST: 31 U/L (ref 15–41)
BUN: 83 mg/dL — ABNORMAL HIGH (ref 6–20)
CHLORIDE: 96 mmol/L — AB (ref 101–111)
CO2: 25 mmol/L (ref 22–32)
Calcium: 8.7 mg/dL — ABNORMAL LOW (ref 8.9–10.3)
Creatinine, Ser: 13.2 mg/dL — ABNORMAL HIGH (ref 0.44–1.00)
GFR calc Af Amer: 3 mL/min — ABNORMAL LOW (ref >60)
GFR calc non Af Amer: 2 mL/min — ABNORMAL LOW (ref >60)
GLUCOSE: 180 mg/dL — AB (ref 65–99)
POTASSIUM: 3.8 mmol/L (ref 3.5–5.1)
SODIUM: 138 mmol/L (ref 135–145)
Total Bilirubin: 0.4 mg/dL (ref 0.3–1.2)
Total Protein: 6.1 g/dL — ABNORMAL LOW (ref 6.5–8.1)

## 2016-06-11 LAB — TROPONIN I: Troponin I: 0.04 ng/mL (ref <0.03)

## 2016-06-11 NOTE — ED Provider Notes (Signed)
[facility] Emergency Department Provider Note  Time seen: 4:03 PM  I have reviewed the triage vital signs and the nursing notes.   HISTORY  Chief Complaint Chest Pain    HPI Laurie Steele is a 75 y.o. female with a past medical history of anemia, gout, hypertension, chronic back pain, CK D, diabetes, who presents to the emergency department with a sensation of bugs crawling on her skin and anxiousness. According to the patient she took her medications today earlier this morning. States around noon she began feeling the sensation of being very fidgety and anxious. States he felt bugs crawling on her skin, fatigue. She is not sure if she took a medication by accident that was not prescribed to her. She is not sure what that medication would've been. She brought her back to medications, but all of which she takes regularly. Patient denies any chest pain but states earlier today she felt chest tightness. Denies any abdominal pain, nausea, vomiting, diarrhea. Does state lower back pain but states this is chronic. Also states extremity pain but states this is chronic due to gout. Patient is currently taking colchicine for a gout flare.  Past Medical History:  Diagnosis Date  . Chronic anemia    Dr Inez Pilgrim  . GERD (gastroesophageal reflux disease)   . Glaucoma   . Gout   . Hx of colonic polyps   . Hyperlipidemia   . Hypertension   . Lower back pain   . Miscarriage   . 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 Peritoneal dialysis status ([place])   . Renal failure    Dr Holley Raring  . 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 Vaginal delivery    x 4    Patient Active Problem List   Diagnosis Date Noted  . Hypoglycemia 05/21/2016  . Chest pain 04/05/2016  . Hypercalcemia 03/14/2016  . Bronchitis 03/03/2012  . Type II or unspecified type diabetes  mellitus with renal manifestations, not stated as uncontrolled 12/18/2011  . END STAGE RENAL DISEASE 10/23/2010  . NEUROPATHY 12/13/2009  . GOUT 02/15/2009  . SLEEP DISORDER 02/15/2009  . GERD 02/23/2008  . HYPERLIPIDEMIA 05/14/2007  . ANEMIA, CHRONIC 05/14/2007  . HYPERTENSION 05/14/2007    Past Surgical History:  Procedure Laterality Date  . ABDOMINAL HYSTERECTOMY  1983  . BACK SURGERY  6/08   Dr Collier [place]ina  . BACK SURGERY  1987   disc  . CHOLECYSTECTOMY    . PERITONEAL CATHETER INSERTION      Prior to Admission medications   Medication Sig Start Date End Date Taking? Authorizing Provider  allopurinol (ZYLOPRIM) 100 MG tablet Take 1 tablet by mouth daily.     Historical Provider, MD  ALPRAZolam Duanne Moron) 0.25 MG tablet Take 1 tablet by mouth at bedtime as needed for sleep.     Historical Provider, MD  amitriptyline (ELAVIL) 25 MG tablet Take 1 tablet by mouth at bedtime as needed (For foot pain).  01/02/16   Historical Provider, MD  amLODipine (NORVASC) 10 MG tablet Take 10 mg by mouth daily. Take it mid-day 03/20/16   Historical Provider, MD  aspirin EC 325 MG tablet Take 325 mg by mouth daily.    Historical Provider, MD  atenolol (TENORMIN) 25 MG tablet Take 1 tablet by mouth daily.    Historical Provider, MD  B Complex-C-Folic Acid (DIALYVITE  800 PO) Take 1 tablet by mouth daily.    Historical Provider, MD  cephALEXin (KEFLEX) 500 MG capsule Take 1 capsule (500 mg total) by mouth every 12 (twelve) hours. 05/28/16   Fritzi Mandes, MD  cinacalcet (SENSIPAR) 90 MG tablet Take 90 mg by mouth daily. With lunch    Historical Provider, MD  cloNIDine (CATAPRES) 0.3 MG tablet Take 0.3 mg by mouth 2 (two) times daily.    Historical Provider, MD  cyclobenzaprine (FLEXERIL) 5 MG tablet Take 1 tablet by mouth 3 (three) times daily as needed for muscle spasms.     Historical Provider, MD  fluticasone (FLONASE) 50 MCG/ACT nasal spray Place 2 sprays into both nostrils daily as needed for allergies.      Historical Provider, MD  gabapentin (NEURONTIN) 100 MG capsule Take 1 capsule by mouth 3 (three) times daily. 08/09/15 08/08/16  Historical Provider, MD  hydrALAZINE (APRESOLINE) 50 MG tablet Take 50 mg by mouth 3 (three) times daily.      Historical Provider, MD  HYDROcodone-acetaminophen (NORCO/VICODIN) 5-325 MG tablet Take 1 tablet by mouth 2 (two) times daily as needed for moderate pain.     Historical Provider, MD  insulin aspart (NOVOLOG) 100 UNIT/ML injection Inject 0-5 Units into the skin at bedtime. 05/28/16   Fritzi Mandes, MD  insulin glargine (LANTUS) 100 UNIT/ML injection Inject 0.42 mLs (42 Units total) into the skin at bedtime. 05/28/16   Fritzi Mandes, MD  ipratropium (ATROVENT) 0.03 % nasal spray Place 1 spray into both nostrils 3 (three) times daily as needed for rhinitis.    Historical Provider, MD  isosorbide mononitrate (IMDUR) 30 MG 24 hr tablet Take 30 mg by mouth 2 (two) times daily.    Historical Provider, MD  latanoprost (XALATAN) 0.005 % ophthalmic solution Place 1 drop into both eyes at bedtime. 03/20/16   Historical Provider, MD  losartan (COZAAR) 100 MG tablet Take 100 mg by mouth daily. 03/20/16   Historical Provider, MD  metoCLOPramide (REGLAN) 10 MG tablet Take 10 mg by mouth 3 (three) times daily as needed for nausea.     Historical Provider, MD  pravastatin (PRAVACHOL) 80 MG tablet Take 1 tablet (80 mg total) by mouth daily. Patient taking differently: Take 80 mg by mouth at bedtime.  01/27/14   Venia Carbon, MD  predniSONE (DELTASONE) 10 MG tablet Take 10-60 mg by mouth daily. 60 mg daily on day 1, then 50 mg daily on day 2, then 40 mg daily on day 3, then 30 mg daily on day 4, then 20 mg daily on day 5, then 10 mg daily on day 6, then stop 05/19/16   Historical Provider, MD  sevelamer (RENVELA) 800 MG tablet Take 2,400 mg by mouth 3 (three) times daily with meals.     Historical Provider, MD    No Known Allergies  Family History  Problem Relation Age of Onset  .  Heart attack Father   . Hypertension Mother   . Hyperlipidemia Mother   . Coronary artery disease      strong fam hx  . Breast cancer Neg Hx     Social History Social History  Substance Use Topics  . Smoking status: Former Smoker    Quit date: 11/05/1990  . Smokeless tobacco: Never Used  . Alcohol use No    Review of Systems Constitutional: Negative for fever.Fatigue. Cardiovascular: Negative for chest pain. Respiratory: Negative for shortness of breath. Gastrointestinal: Negative for abdominal pain Musculoskeletal: Mild lower back pain. Chronic.  Neurological: Negative for headaches, focal weakness or numbness. 10-point ROS otherwise negative.  ____________________________________________   PHYSICAL EXAM:  VITAL SIGNS: ED Triage Vitals  Enc Vitals Group     BP 06/11/16 1351 (!) 159/59     Pulse Rate 06/11/16 1351 87     Resp 06/11/16 1351 18     Temp 06/11/16 1351 98.6 F (37 C)     Temp Source 06/11/16 1351 Oral     SpO2 06/11/16 1351 97 %     Weight 06/11/16 1351 220 lb (99.8 kg)     Height 06/11/16 1351 5\' 7"  (1.702 m)     Head Circumference --      Peak Flow --      Pain Score 06/11/16 1352 3     Pain Loc --      Pain Edu? --      Excl. in [place]? --     Constitutional: Alert and oriented. Well appearing and in no distress. Eyes: Normal exam ENT   Head: Normocephalic and atraumatic.   Mouth/Throat: Mucous membranes are moist. Cardiovascular: Normal rate, regular rhythm. No murmur Respiratory: Normal respiratory effort without tachypnea nor retractions. Breath sounds are clear  Gastrointestinal: Soft and nontender. No distention.   Musculoskeletal: Nontender with normal range of motion in all extremities.  Neurologic:  Normal speech and language. No gross focal neurologic deficits Skin:  Skin is warm, dry and intact.  Psychiatric: Mood and affect are normal. Speech and behavior are normal.   ____________________________________________     EKG  EKG reviewed and interpreted by myself shows normal sinus rhythm at 88 bpm, narrow QRS, left axis deviation, no ST changes. Overall reassuring EKG.  ____________________________________________     INITIAL IMPRESSION / ASSESSMENT AND PLAN / ED COURSE  Pertinent labs & imaging results that were available during my care of the patient were reviewed by me and considered in my medical decision making (see chart for details).  The patient presents the emergency department with symptoms of feeling anxious with bugs crawling on her skin, mild chest tightness this morning, which has resolved. Patient states she no longer feels things crawling on her scan but states she does feel quite tired. We will check labs, EKG and closely monitor in the emergency department.  Labs are largely at the patient's baseline. Patient's hemoglobin is 6.5, close to where it has been over the past one month. This is likely due to anemia of chronic disease. No discussed with the patient the need to follow-up with her primary care doctor this week for repeat labs. She states she has an appointment tomorrow and will discuss it with her doctor then. She states she feels normal at this time. Her troponin is 0.04 which is normal for her. Denies any symptoms at this time. We'll discharge home with PCP follow-up tomorrow, and plan for the PCP to repeat blood work this week. I will send a note to her PCP.  ____________________________________________   FINAL CLINICAL IMPRESSION(S) / ED DIAGNOSES  Chest tightness    Harvest Dark, MD 06/11/16 [phone]

## 2016-06-11 NOTE — ED Notes (Signed)
Labs drawn and specimens sent  Assessment complted  Pt reports  "I have been feeling like I have bugs crawling all over me..myalgias elbow hurts because I have gout."

## 2016-06-11 NOTE — ED Triage Notes (Signed)
Reports taking a new med today she thinks is xanax.  States since taking it she has back pain, chest pain and feels like bugs are crawling all over her.  Pt alert, very fidgety , no resp distress

## 2016-06-11 NOTE — ED Notes (Signed)
Brother and son to stay with pt until she gets a room.

## 2016-06-15 ENCOUNTER — Encounter: Payer: Self-pay | Admitting: Emergency Medicine

## 2016-06-15 ENCOUNTER — Inpatient Hospital Stay
Admission: EM | Admit: 2016-06-15 | Discharge: 2016-06-18 | DRG: 811 | Disposition: A | Discharge status: Home / Self Care | Payer: Medicare Other | Attending: Internal Medicine | Admitting: Internal Medicine

## 2016-06-15 DIAGNOSIS — M25521 Pain in right elbow: Secondary | ICD-10-CM

## 2016-06-15 DIAGNOSIS — Z87891 Personal history of nicotine dependence: Secondary | ICD-10-CM

## 2016-06-15 DIAGNOSIS — D649 Anemia, unspecified: Secondary | ICD-10-CM | POA: Diagnosis present

## 2016-06-15 DIAGNOSIS — I248 Other forms of acute ischemic heart disease: Secondary | ICD-10-CM | POA: Diagnosis present

## 2016-06-15 DIAGNOSIS — H409 Unspecified glaucoma: Secondary | ICD-10-CM | POA: Diagnosis present

## 2016-06-15 DIAGNOSIS — D631 Anemia in chronic kidney disease: Secondary | ICD-10-CM | POA: Diagnosis not present

## 2016-06-15 DIAGNOSIS — Z86010 Personal history of colonic polyps: Secondary | ICD-10-CM

## 2016-06-15 DIAGNOSIS — Z9889 Other specified postprocedural states: Secondary | ICD-10-CM

## 2016-06-15 DIAGNOSIS — Z992 Dependence on renal dialysis: Secondary | ICD-10-CM

## 2016-06-15 DIAGNOSIS — N186 End stage renal disease: Secondary | ICD-10-CM | POA: Diagnosis present

## 2016-06-15 DIAGNOSIS — I120 Hypertensive chronic kidney disease with stage 5 chronic kidney disease or end stage renal disease: Secondary | ICD-10-CM | POA: Diagnosis present

## 2016-06-15 DIAGNOSIS — Z90710 Acquired absence of both cervix and uterus: Secondary | ICD-10-CM

## 2016-06-15 DIAGNOSIS — M25421 Effusion, right elbow: Secondary | ICD-10-CM

## 2016-06-15 DIAGNOSIS — Z8249 Family history of ischemic heart disease and other diseases of the circulatory system: Secondary | ICD-10-CM

## 2016-06-15 DIAGNOSIS — M109 Gout, unspecified: Secondary | ICD-10-CM | POA: Diagnosis present

## 2016-06-15 DIAGNOSIS — E1122 Type 2 diabetes mellitus with diabetic chronic kidney disease: Secondary | ICD-10-CM | POA: Diagnosis present

## 2016-06-15 DIAGNOSIS — Z9049 Acquired absence of other specified parts of digestive tract: Secondary | ICD-10-CM

## 2016-06-15 DIAGNOSIS — Z7982 Long term (current) use of aspirin: Secondary | ICD-10-CM

## 2016-06-15 DIAGNOSIS — Z794 Long term (current) use of insulin: Secondary | ICD-10-CM

## 2016-06-15 DIAGNOSIS — N2581 Secondary hyperparathyroidism of renal origin: Secondary | ICD-10-CM | POA: Diagnosis present

## 2016-06-15 DIAGNOSIS — M19021 Primary osteoarthritis, right elbow: Secondary | ICD-10-CM | POA: Diagnosis present

## 2016-06-15 DIAGNOSIS — E785 Hyperlipidemia, unspecified: Secondary | ICD-10-CM | POA: Diagnosis present

## 2016-06-15 DIAGNOSIS — K219 Gastro-esophageal reflux disease without esophagitis: Secondary | ICD-10-CM | POA: Diagnosis present

## 2016-06-15 DIAGNOSIS — E11319 Type 2 diabetes mellitus with unspecified diabetic retinopathy without macular edema: Secondary | ICD-10-CM | POA: Diagnosis present

## 2016-06-15 DIAGNOSIS — Z79899 Other long term (current) drug therapy: Secondary | ICD-10-CM

## 2016-06-15 DIAGNOSIS — R1310 Dysphagia, unspecified: Secondary | ICD-10-CM | POA: Diagnosis present

## 2016-06-15 LAB — CBC
HEMATOCRIT: 20.2 % — AB (ref 35.0–47.0)
Hemoglobin: 6.8 g/dL — ABNORMAL LOW (ref 12.0–16.0)
MCH: 34.2 pg — AB (ref 26.0–34.0)
MCHC: 33.4 g/dL (ref 32.0–36.0)
MCV: 102.5 fL — AB (ref 80.0–100.0)
Platelets: 224 10*3/uL (ref 150–440)
RBC: 1.97 MIL/uL — ABNORMAL LOW (ref 3.80–5.20)
RDW: 21.1 % — AB (ref 11.5–14.5)
WBC: 9.1 10*3/uL (ref 3.6–11.0)

## 2016-06-15 LAB — PROTIME-INR
INR: 1.18
Prothrombin Time: 15.1 seconds (ref 11.4–15.2)

## 2016-06-15 LAB — GLUCOSE, CAPILLARY: Glucose-Capillary: 150 mg/dL — ABNORMAL HIGH (ref 65–99)

## 2016-06-15 LAB — PREPARE RBC (CROSSMATCH)

## 2016-06-15 LAB — APTT: APTT: 32 s (ref 24–36)

## 2016-06-15 MED ORDER — CINACALCET HCL 30 MG PO TABS
90.0000 mg | ORAL_TABLET | Freq: Every day | ORAL | Status: DC
  Administered 2016-06-16 – 2016-06-17 (×2): 90 mg via ORAL
  Filled 2016-06-15 (×2): qty 3

## 2016-06-15 MED ORDER — HYDRALAZINE HCL 50 MG PO TABS
50.0000 mg | ORAL_TABLET | Freq: Three times a day (TID) | ORAL | Status: DC
  Administered 2016-06-16 – 2016-06-18 (×6): 50 mg via ORAL
  Filled 2016-06-15 (×7): qty 1

## 2016-06-15 MED ORDER — ALLOPURINOL 100 MG PO TABS
100.0000 mg | ORAL_TABLET | Freq: Every day | ORAL | Status: DC
  Administered 2016-06-15 – 2016-06-18 (×4): 100 mg via ORAL
  Filled 2016-06-15 (×4): qty 1

## 2016-06-15 MED ORDER — IPRATROPIUM BROMIDE 0.03 % NA SOLN: 1.0000 | Freq: Three times a day (TID) | NASAL | Status: DC

## 2016-06-15 MED ORDER — SEVELAMER CARBONATE 800 MG PO TABS
2400.0000 mg | ORAL_TABLET | Freq: Three times a day (TID) | ORAL | Status: DC
  Administered 2016-06-16 – 2016-06-18 (×8): 2400 mg via ORAL
  Filled 2016-06-15 (×8): qty 3

## 2016-06-15 MED ORDER — LATANOPROST 0.005 % OP SOLN
1.0000 [drp] | Freq: Every day | OPHTHALMIC | Status: DC
  Administered 2016-06-17: 1 [drp] via OPHTHALMIC
  Filled 2016-06-15 (×2): qty 2.5

## 2016-06-15 MED ORDER — METOCLOPRAMIDE HCL 10 MG PO TABS: 10.0000 mg | ORAL_TABLET | Freq: Three times a day (TID) | ORAL | Status: DC | PRN

## 2016-06-15 MED ORDER — ALPRAZOLAM 0.25 MG PO TABS: 0.2500 mg | ORAL_TABLET | Freq: Every evening | ORAL | Status: DC | PRN

## 2016-06-15 MED ORDER — HYDROCODONE-ACETAMINOPHEN 5-325 MG PO TABS
1.0000 | ORAL_TABLET | ORAL | Status: DC | PRN
  Administered 2016-06-16 – 2016-06-18 (×6): 2 via ORAL
  Filled 2016-06-15 (×7): qty 2

## 2016-06-15 MED ORDER — ATENOLOL 25 MG PO TABS
25.0000 mg | ORAL_TABLET | Freq: Every day | ORAL | Status: DC
  Administered 2016-06-16 – 2016-06-17 (×2): 25 mg via ORAL
  Filled 2016-06-15 (×2): qty 1

## 2016-06-15 MED ORDER — CLONIDINE HCL 0.1 MG PO TABS
0.3000 mg | ORAL_TABLET | Freq: Two times a day (BID) | ORAL | Status: DC
  Administered 2016-06-15 – 2016-06-18 (×6): 0.3 mg via ORAL
  Filled 2016-06-15 (×6): qty 3

## 2016-06-15 MED ORDER — GABAPENTIN 100 MG PO CAPS: 100.0000 mg | ORAL_CAPSULE | Freq: Three times a day (TID) | ORAL | Status: DC

## 2016-06-15 MED ORDER — SODIUM CHLORIDE 0.9 % IV SOLN
INTRAVENOUS | Status: DC
  Administered 2016-06-15 – 2016-06-16 (×2): via INTRAVENOUS

## 2016-06-15 MED ORDER — AMITRIPTYLINE HCL 50 MG PO TABS: 25.0000 mg | ORAL_TABLET | Freq: Every evening | ORAL | Status: DC | PRN

## 2016-06-15 MED ORDER — HYDROCODONE-ACETAMINOPHEN 5-325 MG PO TABS: 1.0000 | ORAL_TABLET | Freq: Two times a day (BID) | ORAL | Status: DC | PRN

## 2016-06-15 MED ORDER — SODIUM CHLORIDE 0.9 % IV SOLN: 10.0000 mL/h | Freq: Once | INTRAVENOUS | Status: DC

## 2016-06-15 MED ORDER — ASPIRIN EC 325 MG PO TBEC
325.0000 mg | DELAYED_RELEASE_TABLET | Freq: Every day | ORAL | Status: DC
  Administered 2016-06-16 – 2016-06-17 (×2): 325 mg via ORAL
  Filled 2016-06-15 (×2): qty 1

## 2016-06-15 MED ORDER — INSULIN ASPART 100 UNIT/ML ~~LOC~~ SOLN
0.0000 [IU] | Freq: Three times a day (TID) | SUBCUTANEOUS | Status: DC
  Administered 2016-06-16: 4 [IU] via SUBCUTANEOUS
  Administered 2016-06-17 (×2): 2 [IU] via SUBCUTANEOUS
  Filled 2016-06-15 (×2): qty 2
  Filled 2016-06-15: qty 4

## 2016-06-15 MED ORDER — ONDANSETRON HCL 4 MG/2ML IJ SOLN
4.0000 mg | Freq: Four times a day (QID) | INTRAMUSCULAR | Status: DC | PRN
  Administered 2016-06-17: 4 mg via INTRAVENOUS
  Filled 2016-06-15: qty 2

## 2016-06-15 MED ORDER — INSULIN GLARGINE 100 UNIT/ML ~~LOC~~ SOLN
42.0000 [IU] | Freq: Every day | SUBCUTANEOUS | Status: DC
  Administered 2016-06-16 – 2016-06-17 (×2): 42 [IU] via SUBCUTANEOUS
  Filled 2016-06-15 (×4): qty 0.42

## 2016-06-15 MED ORDER — LOSARTAN POTASSIUM 50 MG PO TABS
100.0000 mg | ORAL_TABLET | Freq: Every day | ORAL | Status: DC
Start: 2016-06-16 — End: 2016-06-18
  Administered 2016-06-16 – 2016-06-18 (×3): 100 mg via ORAL
  Filled 2016-06-15 (×3): qty 2

## 2016-06-15 MED ORDER — SODIUM CHLORIDE 0.9% FLUSH
3.0000 mL | Freq: Two times a day (BID) | INTRAVENOUS | Status: DC
  Administered 2016-06-15 – 2016-06-18 (×6): 3 mL via INTRAVENOUS

## 2016-06-15 MED ORDER — ACETAMINOPHEN 325 MG PO TABS
650.0000 mg | ORAL_TABLET | Freq: Four times a day (QID) | ORAL | Status: DC | PRN
  Administered 2016-06-15: 650 mg via ORAL
  Filled 2016-06-15: qty 2

## 2016-06-15 MED ORDER — ONDANSETRON HCL 4 MG PO TABS: 4.0000 mg | ORAL_TABLET | Freq: Four times a day (QID) | ORAL | Status: DC | PRN

## 2016-06-15 MED ORDER — AMLODIPINE BESYLATE 10 MG PO TABS
10.0000 mg | ORAL_TABLET | Freq: Every day | ORAL | Status: DC
  Administered 2016-06-16 – 2016-06-18 (×3): 10 mg via ORAL
  Filled 2016-06-15 (×3): qty 1

## 2016-06-15 MED ORDER — DIALYVITE 800 0.8 MG PO TABS: ORAL_TABLET | Freq: Every day | ORAL | Status: DC

## 2016-06-15 MED ORDER — ISOSORBIDE MONONITRATE ER 30 MG PO TB24
30.0000 mg | ORAL_TABLET | Freq: Two times a day (BID) | ORAL | Status: DC
  Administered 2016-06-16 – 2016-06-18 (×4): 30 mg via ORAL
  Filled 2016-06-15 (×5): qty 1

## 2016-06-15 MED ORDER — ACETAMINOPHEN 650 MG RE SUPP: 650.0000 mg | Freq: Four times a day (QID) | RECTAL | Status: DC | PRN

## 2016-06-15 MED ORDER — SODIUM CHLORIDE 0.9 % IV SOLN
Freq: Once | INTRAVENOUS | Status: AC
  Administered 2016-06-15: 23:00:00 via INTRAVENOUS

## 2016-06-15 NOTE — H&P (Signed)
[place] @ [facility] Admission History and Physical Laurie Steele, D.O.  ---------------------------------------------------------------------------------------------------------------------   PATIENT NAME: Laurie Steele MR#: [id number] DATE OF BIRTH: 1941-03-10 DATE OF ADMISSION: 06/15/2016 PRIMARY CARE PHYSICIAN: [name], MD  REQUESTING/REFERRING PHYSICIAN: ED Dr. Marcelene Butte  CHIEF COMPLAINT: Chief Complaint  Patient presents with  . Abnormal Lab    HISTORY OF PRESENT ILLNESS: Laurie Steele is a 75 y.o. female with a known history of chronic anemia, end-stage renal disease on peritoneal dialysis, hypertension, hyperlipidemia, diabetes who had blood work done recently by her primary care provider and was called today and instructed to go to the emergency department for evaluation secondary to low hemoglobin and hematocrit. Patient states that she has had fatigue, weakness but denies any shortness of breath, chest pain, dizziness, lightheadedness. She denies any hematemesis, hematochezia or melena. She denies vaginal bleeding. She states that her last colonoscopy was 5 years ago and that she has had workup for chronic anemia but "they don't know why she is anemic."  Per her records she is supposed to be taking iron supplementation but it is unclear whether or not she is actually taking.  Otherwise there has been no change in status. Patient has been taking medication as prescribed and there has been no recent change in medication or diet.  There has been no recent illness, travel or sick contacts.    Patient denies fevers/chills, dizziness, chest pain, shortness of breath, N/V/C/D, abdominal pain, dysuria/frequency, changes in mental status.    PAST MEDICAL HISTORY: Past Medical History:  Diagnosis Date  . Chronic anemia    Dr Inez Pilgrim  . GERD (gastroesophageal reflux disease)   . Glaucoma   . Gout   . Hx of colonic polyps   . Hyperlipidemia   .  Hypertension   . Lower back pain   . Miscarriage   . 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 Peritoneal dialysis status ([place])   . Renal failure    Dr Holley Raring  . 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 Vaginal delivery    x 4      PAST SURGICAL HISTORY: Past Surgical History:  Procedure Laterality Date  . ABDOMINAL HYSTERECTOMY  1983  . BACK SURGERY  6/08   Dr Collier Salina  . BACK SURGERY  1987   disc  . CHOLECYSTECTOMY    . PERITONEAL CATHETER INSERTION        SOCIAL HISTORY: Social History  Substance Use Topics  . Smoking status: Former Smoker    Quit date: 11/05/1990  . Smokeless tobacco: Never Used  . Alcohol use No      FAMILY HISTORY: Family History  Problem Relation Age of Onset  . Heart attack Father   . Hypertension Mother   . Hyperlipidemia Mother   . Coronary artery disease      strong fam hx  . Breast cancer Neg Hx      MEDICATIONS AT HOME: Prior to Admission medications   Medication Sig Start Date End Date Taking? Authorizing Provider  allopurinol (ZYLOPRIM) 100 MG tablet Take 1 tablet by mouth daily.    Yes Historical Provider, MD  amitriptyline (ELAVIL) 25 MG tablet Take 1 tablet by mouth at bedtime as needed (For foot pain).  01/02/16  Yes Historical Provider, MD  amLODipine (NORVASC) 10 MG tablet Take 10 mg by mouth daily. Take it mid-day 03/20/16  Yes Historical Provider, MD  aspirin EC 325 MG tablet Take 325 mg by mouth daily.   Yes Historical Provider, MD  atenolol (TENORMIN) 25 MG tablet Take 1 tablet by mouth daily.   Yes Historical Provider, MD  cloNIDine (CATAPRES) 0.3 MG tablet Take 0.3 mg by mouth 2 (two) times daily.   Yes Historical Provider, MD  hydrALAZINE (APRESOLINE) 50 MG tablet Take 50 mg by mouth 3 (three) times daily.     Yes Historical Provider, MD  insulin glargine (LANTUS) 100 UNIT/ML injection Inject 0.42  mLs (42 Units total) into the skin at bedtime. 05/28/16  Yes Fritzi Mandes, MD  insulin lispro protamine-lispro (HUMALOG 50/50 MIX) (50-50) 100 UNIT/ML SUSP injection Inject 0-5 Units into the skin at bedtime. Per sliding scale   Yes Historical Provider, MD  latanoprost (XALATAN) 0.005 % ophthalmic solution Place 1 drop into both eyes at bedtime. 03/20/16  Yes Historical Provider, MD  lidocaine (XYLOCAINE) 5 % ointment Apply 1 application topically 3 (three) times daily as needed.   Yes Historical Provider, MD  losartan (COZAAR) 100 MG tablet Take 100 mg by mouth daily. 03/20/16  Yes Historical Provider, MD  metoCLOPramide (REGLAN) 10 MG tablet Take 10 mg by mouth 3 (three) times daily as needed for nausea.    Yes Historical Provider, MD  ALPRAZolam Duanne Moron) 0.25 MG tablet Take 1 tablet by mouth at bedtime as needed for sleep.     Historical Provider, MD  B Complex-C-Folic Acid (DIALYVITE Q000111Q PO) Take 1 tablet by mouth daily.    Historical Provider, MD  cinacalcet (SENSIPAR) 90 MG tablet Take 90 mg by mouth daily. With lunch    Historical Provider, MD  gabapentin (NEURONTIN) 100 MG capsule Take 1 capsule by mouth 3 (three) times daily. 08/09/15 08/08/16  Historical Provider, MD  HYDROcodone-acetaminophen (NORCO/VICODIN) 5-325 MG tablet Take 1 tablet by mouth 2 (two) times daily as needed for moderate pain.     Historical Provider, MD  insulin aspart (NOVOLOG) 100 UNIT/ML injection Inject 0-5 Units into the skin at bedtime. Patient not taking: Reported on 06/15/2016 05/28/16   Fritzi Mandes, MD  ipratropium (ATROVENT) 0.03 % nasal spray Place 1 spray into both nostrils 3 (three) times daily as needed for rhinitis.    Historical Provider, MD  isosorbide mononitrate (IMDUR) 30 MG 24 hr tablet Take 30 mg by mouth 2 (two) times daily.    Historical Provider, MD  sevelamer (RENVELA) 800 MG tablet Take 2,400 mg by mouth 3 (three) times daily with meals.     Historical Provider, MD      DRUG ALLERGIES: No Known  Allergies   REVIEW OF SYSTEMS: CONSTITUTIONAL: No fever/chills, (+) fatigue, (+) weakness. No weight gain/loss, headache EYES: No blurry or double vision. ENT: No tinnitus, postnasal drip, redness or soreness of the oropharynx. RESPIRATORY: No cough, wheeze, hemoptysis, dyspnea. CARDIOVASCULAR: No chest pain, orthopnea, palpitations, syncope. GASTROINTESTINAL: No nausea, vomiting, constipation, diarrhea, abdominal pain, hematemesis, melena or hematochezia. GENITOURINARY: No dysuria or hematuria. No vaginal bleeding. ENDOCRINE: No polyuria or nocturia. No heat or cold intolerance. HEMATOLOGY: No anemia, bruising, bleeding. INTEGUMENTARY: No rashes, ulcers, lesions. MUSCULOSKELETAL: No arthritis, swelling, gout. NEUROLOGIC: No numbness, tingling, weakness or ataxia. No seizure-type activity. PSYCHIATRIC: No anxiety, depression, insomnia.  PHYSICAL EXAMINATION: VITAL SIGNS: Blood pressure (!) 134/55, pulse 82, temperature 98.8 F (37.1 C), temperature source Oral, resp. rate 16, height 5\' 7"  (1.702 m), weight 99.8 kg (220 lb), SpO2 97 %.  GENERAL: 75 y.o.-year-old black female patient, well-developed, well-nourished lying  in the bed in no acute distress.  She is extremely fatigued, falls asleep mid sentence. HEENT: Head atraumatic, normocephalic. Pupils equal, round, reactive to light and accommodation. No scleral icterus. Extraocular muscles intact. Nares are patent. Oropharynx is clear. Mucus membranes moist. NECK: Supple, full range of motion. No JVD, no bruit heard. No thyroid enlargement, no tenderness, no cervical lymphadenopathy. CHEST: Normal breath sounds bilaterally. No wheezing, rales, rhonchi or crackles. No use of accessory muscles of respiration.  No reproducible chest wall tenderness.  CARDIOVASCULAR: S1, S2 normal. Systolic ejection murmur at left sternal border. Cap refill <2 seconds. ABDOMEN: Soft, nontender, nondistended. No rebound, guarding, rigidity. Normoactive bowel  sounds present in all four quadrants. No organomegaly or mass. EXTREMITIES: Full range of motion. No pedal edema, cyanosis, or clubbing. NEUROLOGIC: Cranial nerves II through XII are grossly intact with no focal sensorimotor deficit. Muscle strength 5/5 in all extremities. Sensation intact. Gait not checked. PSYCHIATRIC: The patient is alert and oriented x 3. Normal affect, mood, thought content. SKIN: Warm, dry, and intact without obvious rash, lesion, or ulcer.  LABORATORY PANEL:  CBC  Recent Labs Lab 06/15/16 1553  WBC 9.1  HGB 6.8*  HCT 20.2*  PLT 224   ----------------------------------------------------------------------------------------------------------------- Chemistries  Recent Labs Lab 06/11/16 1611  NA 138  K 3.8  CL 96*  CO2 25  GLUCOSE 180*  BUN 83*  CREATININE 13.20*  CALCIUM 8.7*  AST 31  ALT 27  ALKPHOS 103  BILITOT 0.4   ------------------------------------------------------------------------------------------------------------------ Cardiac Enzymes  Recent Labs Lab 06/11/16 1611  TROPONINI 0.04*   ------------------------------------------------------------------------------------------------------------------  RADIOLOGY: No results found.  EKG: Pending  IMPRESSION AND PLAN:  This is a 75 y.o. female with a history of end-stage renal disease on peritoneal dialysis, anemia of chronic disease, HTN, HLD, now being admitted with: 1. Symptomatic anemia - hemoglobin not far off from her baseline one month ago 7.4 however patient with symptoms of severe fatigue, weakness. Admit to [place] with telemetry monitoring, transfuse 2 units of packed red blood cells and recheck CBC posttransfusion. Iron studies have been done recently however we will reorder and initiate iron by mouth. We'll check EKG. 2.  Pearline Cables zone troponin-likely secondary to demand ischemia however we will trend troponins.   Admit:  Medical bed with telemetry monitoring Diagnoses:  Symptomatically anemia, elevated troponin  Condition: Guarded  Vitals: Per protocol Activity: up with assistance Diet/NutritionHeart healthy, carb controlled  Fluids:  IV normal saline Home Meds: Continue all eInsulin. We'll cover with sliding scale coverage  Consults:  none  DVT Px: SCDs and early ambulation  All the records are reviewed and case discussed with ED provider. Management plans discussed with the patient and/or family who express understanding and agree with plan of care.  CODE STATUS: Full TOTAL TIME TAKING CARE OF THIS PATIENT: 60 minutes.   Jordan Caraveo D.O. on 06/15/2016 at 5:18 PM Between 7am to 6pm - Pager - [phone] After 6pm go to [email or web address] - [employer] [facility] Office [phone] CC: Primary care physician; [name], MD     Note: This dictation was prepared with Dragon dictation along with smaller phrase technology. Any transcriptional errors that result from this process are unintentional.

## 2016-06-15 NOTE — Progress Notes (Signed)
First unit of blood transfusion has been completed at this time. Pt is resting comfortably. VSS.   Angus Seller

## 2016-06-15 NOTE — Progress Notes (Signed)
Second unit of blood transfusion was started at 2240 06/15/2016. Pt resting comfortably, RN will be at pt bedside for the first 15 min. Recheck VS at 2255.  Angus Seller

## 2016-06-15 NOTE — ED Notes (Signed)
This nurse spoke with [name], patient's son, with patient's permission with update.  [name]: [place]: [phone]

## 2016-06-15 NOTE — ED Provider Notes (Addendum)
Time Seen: Approximately *1604  I have reviewed the triage notes  Chief Complaint: Abnormal Lab   History of Present Illness: Laurie Steele is a 75 y.o. female *who presents with symptomatic anemia. Patient had blood drawn by primary physician on Wednesday and was noticed to have a low hemoglobin of 6.4. She states she's had some generalized fatigue, generalized weakness, which he denies any shortness of breath or chest pain. She denies any melena or hematochezia. She denies any abdominal pain. She is not currently on any iron supplements.  Past Medical History:  Diagnosis Date  . Chronic anemia    Dr Inez Pilgrim  . GERD (gastroesophageal reflux disease)   . Glaucoma   . Gout   . Hx of colonic polyps   . Hyperlipidemia   . Hypertension   . Lower back pain   . Miscarriage   . 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 Peritoneal dialysis status ([place])   . Renal failure    Dr Holley Raring  . 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 Vaginal delivery    x 4    Patient Active Problem List   Diagnosis Date Noted  . Hypoglycemia 05/21/2016  . Chest pain 04/05/2016  . Hypercalcemia 03/14/2016  . Bronchitis 03/03/2012  . Type II or unspecified type diabetes mellitus with renal manifestations, not stated as uncontrolled 12/18/2011  . END STAGE RENAL DISEASE 10/23/2010  . NEUROPATHY 12/13/2009  . GOUT 02/15/2009  . SLEEP DISORDER 02/15/2009  . GERD 02/23/2008  . HYPERLIPIDEMIA 05/14/2007  . ANEMIA, CHRONIC 05/14/2007  . HYPERTENSION 05/14/2007    Past Surgical History:  Procedure Laterality Date  . ABDOMINAL HYSTERECTOMY  1983  . BACK SURGERY  6/08   Dr Collier Salina  . BACK SURGERY  1987   disc  . CHOLECYSTECTOMY    . PERITONEAL CATHETER INSERTION      Past Surgical History:  Procedure Laterality Date  . ABDOMINAL HYSTERECTOMY  1983  . BACK SURGERY  6/08   Dr Collier Salina  . BACK SURGERY  1987   disc  . CHOLECYSTECTOMY    . PERITONEAL CATHETER INSERTION      Current Outpatient Rx  . Order #: [id number] Class: Historical Med  . Order #: [id number] Class: Historical Med  . Order #: [id number] Class: Historical Med  . Order #: [id number] Class: Historical Med  . Order #: [id number] Class: Historical Med  . Order #: [id number] Class: Historical Med  . Order #: [id number] Class: Historical Med  . Order #: [id number] Class: Normal  . Order #: [id number] Class: Historical Med  . Order #: [id number] Class: Historical Med  . Order #: [id number] Class: Historical Med  . Order #: [id number] Class: Historical Med  . Order #: [id number] Class: Historical Med  . Order #: [id number] Class: Historical Med  . Order #: [id number] Class: Historical Med  . Order #: [id number] Class: Normal  . Order #: [id number] Class: Normal  . Order #: [id number] Class: Historical Med  . Order #: [id number] Class: Historical Med  . Order #: [id number] Class: Historical Med  . Order #: [id number] Class: Historical Med  . Order #: [id number] Class: Historical Med  . Order #: [id number] Class: Normal  . Order #: [id number] Class: Historical Med  . Order #: [id number] Class: Historical Med    Allergies:  Review of patient's allergies indicates no known allergies.  Family History: Family History  Problem Relation Age of Onset  . Heart attack Father   . Hypertension Mother   .  Hyperlipidemia Mother   . Coronary artery disease      strong fam hx  . Breast cancer Neg Hx     Social History: Social History  Substance Use Topics  . Smoking status: Former Smoker    Quit date: 11/05/1990  . Smokeless tobacco: Never Used  . Alcohol use No     Review of Systems:   10 point review of systems was performed and was otherwise negative:  Constitutional: No fever Eyes: No visual disturbances ENT: No sore throat, ear pain Cardiac: No chest pain Respiratory: No shortness of breath, wheezing, or stridor Abdomen: No  abdominal pain, no vomiting, No diarrhea Endocrine: No weight loss, No night sweats Extremities: No peripheral edema, cyanosis Skin: No rashes, easy bruising Neurologic: No focal weakness, trouble with speech or swollowing Urologic: No dysuria, Hematuria, or urinary frequency Patient denies any vaginal bleeding  Physical Exam:  ED Triage Vitals [06/15/16 1548]  Enc Vitals Group     BP 120/84     Pulse Rate 77     Resp 20     Temp 98.8 F (37.1 C)     Temp Source Oral     SpO2 96 %     Weight 220 lb (99.8 kg)     Height 5\' 7"  (1.702 m)     Head Circumference      Peak Flow      Pain Score      Pain Loc      Pain Edu?      Excl. in [place]?     General: Awake , Alert , and Oriented times 3; GCS 15 Somewhat lethargic but able answer all questions appropriately Head: Normal cephalic , atraumatic Eyes: Pupils equal , round, reactive to light Nose/Throat: No nasal drainage, patent upper airway without erythema or exudate.  Neck: Supple, Full range of motion, No anterior adenopathy or palpable thyroid masses Lungs: Clear to ascultation without wheezes , rhonchi, or rales Heart: Regular rate, regular rhythm without murmurs , gallops , or rubs Abdomen: Soft, non tender without rebound, guarding , or rigidity; bowel sounds positive and symmetric in all 4 quadrants. No organomegaly .        Extremities: 2 plus symmetric pulses. No edema, clubbing or cyanosis Neurologic: normal ambulation, Motor symmetric without deficits, sensory intact Skin: warm, dry, no rashes Rectal exam with chaperone present shows normal sphincter tone. Stool is guaiac negative with no palpable masses in the rectal vault  Labs:   All laboratory work was reviewed including any pertinent negatives or positives listed below:  Labs Reviewed  CBC - Abnormal; Notable for the following:       Result Value   RBC 1.97 (*)    Hemoglobin 6.8 (*)    HCT 20.2 (*)    MCV 102.5 (*)    MCH 34.2 (*)    RDW 21.1 (*)    All  other components within normal limits  PROTIME-INR  APTT  TYPE AND SCREEN  PREPARE RBC (CROSSMATCH)    EKG ED ECG REPORT I, Daymon Larsen, the attending physician, personally viewed and interpreted this ECG.  Date: 06/15/2016 EKG Time: 1716 Rate: 79 Rhythm: normal sinus rhythm QRS Axis: Left axis deviation Incomplete right bundle branch block Intervals: normal ST/T Wave abnormalities: normal Conduction Disturbances: none Narrative Interpretation: unremarkable No acute ischemic changes  Critical Care: * CRITICAL CARE Performed by: Daymon Larsen   Total critical care time: 33 minutes  Critical care time was exclusive of separately  billable procedures and treating other patients.  Critical care was necessary to treat or prevent imminent or life-threatening deterioration.  Critical care was time spent personally by me on the following activities: development of treatment plan with patient and/or surrogate as well as nursing, discussions with consultants, evaluation of patient's response to treatment, examination of patient, obtaining history from patient or surrogate, ordering and performing treatments and interventions, ordering and review of laboratory studies, ordering and review of radiographic studies, pulse oximetry and re-evaluation of patient's condition. Initiation of blood transfusion     Assessment:  Symptomatic anemia       Plan: * Inpatient management Patient was advised to return immediately if condition worsens. Patient was advised to follow up with their primary care physician or other specialized physicians involved in their outpatient care. The patient and/or family member/power of attorney had laboratory results reviewed at the bedside. All questions and concerns were addressed and appropriate discharge instructions were distributed by the nursing staff.             Daymon Larsen, MD 06/15/16 [place], MD 06/15/16  [phone]

## 2016-06-15 NOTE — ED Triage Notes (Signed)
Pt had CBC drawn on Wednesday and pt's MD wants the patient to receive 2 units of blood. Home health nurse reports pt's hemoglobin was 6.4 and hematocrit 19.9.

## 2016-06-16 LAB — CBC
HEMATOCRIT: 25 % — AB (ref 35.0–47.0)
HEMOGLOBIN: 8.8 g/dL — AB (ref 12.0–16.0)
MCH: 34.1 pg — AB (ref 26.0–34.0)
MCHC: 35.4 g/dL (ref 32.0–36.0)
MCV: 96.1 fL (ref 80.0–100.0)
Platelets: 209 10*3/uL (ref 150–440)
RBC: 2.6 MIL/uL — ABNORMAL LOW (ref 3.80–5.20)
RDW: 20.7 % — AB (ref 11.5–14.5)
WBC: 9.2 10*3/uL (ref 3.6–11.0)

## 2016-06-16 LAB — TROPONIN I: TROPONIN I: 0.05 ng/mL — AB (ref <0.03)

## 2016-06-16 LAB — IRON AND TIBC
Iron: 146 ug/dL (ref 28–170)
SATURATION RATIOS: 88 % — AB (ref 10.4–31.8)
TIBC: 167 ug/dL — AB (ref 250–450)
UIBC: 21 ug/dL

## 2016-06-16 LAB — GLUCOSE, CAPILLARY
GLUCOSE-CAPILLARY: 135 mg/dL — AB (ref 65–99)
GLUCOSE-CAPILLARY: 237 mg/dL — AB (ref 65–99)
Glucose-Capillary: 138 mg/dL — ABNORMAL HIGH (ref 65–99)
Glucose-Capillary: 119 mg/dL — ABNORMAL HIGH (ref 65–99)

## 2016-06-16 LAB — PROTIME-INR
INR: 1.07
Prothrombin Time: 13.9 seconds (ref 11.4–15.2)

## 2016-06-16 LAB — FERRITIN: Ferritin: 895 ng/mL — ABNORMAL HIGH (ref 11–307)

## 2016-06-16 MED ORDER — GENTAMICIN SULFATE 0.1 % EX CREA
1.0000 "application " | TOPICAL_CREAM | Freq: Every day | CUTANEOUS | Status: DC
  Administered 2016-06-16 – 2016-06-17 (×2): 1 via TOPICAL
  Filled 2016-06-16: qty 15

## 2016-06-16 MED ORDER — DELFLEX-LC/2.5% DEXTROSE 394 MOSM/L IP SOLN
INTRAPERITONEAL | Status: DC
  Administered 2016-06-16: 18:00:00 via INTRAPERITONEAL
  Filled 2016-06-16 (×4): qty 3000

## 2016-06-16 MED ORDER — EPOETIN ALFA 20000 UNIT/ML IJ SOLN
20000.0000 [IU] | INTRAMUSCULAR | Status: DC
  Filled 2016-06-16: qty 1

## 2016-06-16 MED ORDER — HEPARIN 1000 UNIT/ML FOR PERITONEAL DIALYSIS
500.0000 [IU] | INTRAMUSCULAR | Status: DC | PRN
  Filled 2016-06-16: qty 0.5

## 2016-06-16 MED ORDER — DELFLEX-LC/1.5% DEXTROSE 346 MOSM/L IP SOLN
INTRAPERITONEAL | Status: DC
  Administered 2016-06-16: 18:00:00 via INTRAPERITONEAL
  Administered 2016-06-17: 10.5 L via INTRAPERITONEAL
  Filled 2016-06-16 (×4): qty 3000

## 2016-06-16 MED ORDER — FERROUS SULFATE 325 (65 FE) MG PO TABS
325.0000 mg | ORAL_TABLET | Freq: Every day | ORAL | Status: DC
  Administered 2016-06-16 – 2016-06-18 (×3): 325 mg via ORAL
  Filled 2016-06-16 (×3): qty 1

## 2016-06-16 NOTE — Progress Notes (Signed)
Laurie Steele at [place] NAME: Laurie Steele    MR#:  [id number]  DATE OF BIRTH:  03-Oct-1941  SUBJECTIVE:  Came in after routine blood work showed drop In hgb.  C/o weakness  REVIEW OF SYSTEMS:   Review of Systems  Constitutional: Negative for chills, fever and weight loss.  HENT: Negative for ear discharge, ear pain and nosebleeds.   Eyes: Negative for blurred vision, pain and discharge.  Respiratory: Negative for sputum production, shortness of breath, wheezing and stridor.   Cardiovascular: Negative for chest pain, palpitations, orthopnea and PND.  Gastrointestinal: Negative for abdominal pain, diarrhea, nausea and vomiting.  Genitourinary: Negative for frequency and urgency.  Musculoskeletal: Negative for back pain and joint pain.  Neurological: Positive for weakness. Negative for sensory change, speech change and focal weakness.  Psychiatric/Behavioral: Negative for depression and hallucinations. The patient is not nervous/anxious.   All other systems reviewed and are negative.  Tolerating Diet: Tolerating PT:   DRUG ALLERGIES:  No Known Allergies  VITALS:  Blood pressure (!) 145/53, pulse 75, temperature 98.7 F (37.1 C), temperature source Oral, resp. rate 17, height 5\' 7"  (1.702 m), weight 96.2 kg (212 lb 3 oz), SpO2 98 %.  PHYSICAL EXAMINATION:   Physical Exam  GENERAL:  75 y.o.-year-old patient lying in the bed with no acute distress.  EYES: Pupils equal, round, reactive to light and accommodation. No scleral icterus. Extraocular muscles intact.  HEENT: Head atraumatic, normocephalic. Oropharynx and nasopharynx clear.  NECK:  Supple, no jugular venous distention. No thyroid enlargement, no tenderness.  LUNGS: Normal breath sounds bilaterally, no wheezing, rales, rhonchi. No use of accessory muscles of respiration.  CARDIOVASCULAR: S1, S2 normal. No murmurs, rubs, or gallops.  ABDOMEN: Soft, nontender, nondistended.  Bowel sounds present. No organomegaly or mass.  EXTREMITIES: No cyanosis, clubbing or edema b/l.    NEUROLOGIC: Cranial nerves II through XII are intact. No focal Motor or sensory deficits b/l.   PSYCHIATRIC:  patient is alert and oriented x 3.  SKIN: No obvious rash, lesion, or ulcer.   LABORATORY PANEL:  CBC  Recent Labs Lab 06/16/16 0402  WBC 9.2  HGB 8.8*  HCT 25.0*  PLT 209    Chemistries   Recent Labs Lab 06/11/16 1611  NA 138  K 3.8  CL 96*  CO2 25  GLUCOSE 180*  BUN 83*  CREATININE 13.20*  CALCIUM 8.7*  AST 31  ALT 27  ALKPHOS 103  BILITOT 0.4   Cardiac Enzymes  Recent Labs Lab 06/16/16 0402  TROPONINI 0.05*   RADIOLOGY:  No results found. ASSESSMENT AND PLAN:   75 y.o. African American female end-stage renal disease, peritoneal dialysis, insulin-dependent diabetes, with complications of retinopathy, nephropathy, glaucoma, GERD, history of gout, hypertension, hyperlipidemia  1.Anemia of chronic kidney disease Hemoglobin currently 6.5 Patient received 2 units of blood overnight. Previously, on July 22, she had received another unit of blood Hemoglobin is low despite getting Procrit GI workup pending. Ordered Hemoccult stools  2. End-stage renal disease We will continue her home peritoneal dialysis    3.HTN Continue home meds   Case discussed with Care Management/Social Worker. Management plans discussed with the patient, family and they are in agreement.  CODE STATUS: FUll  DVT Prophylaxis: SCD  TOTAL TIME TAKING CARE OF THIS PATIENT: 76 minutes.  >50% time spent on counselling and coordination of care  POSSIBLE D/C IN2-3 DAYS, DEPENDING ON CLINICAL CONDITION.  Note: This dictation was prepared with Viviann Spare  dictation along with smaller phrase technology. Any transcriptional errors that result from this process are unintentional.  Meta Kroenke M.D on 06/16/2016 at 4:18 PM  Between 7am to 6pm - Pager - [phone]  After 6pm go to  [email or web address] - password [email or web address]  Office  [phone]  CC: Primary care physician; [name], MD

## 2016-06-16 NOTE — Care Management Obs Status (Signed)
[facility] NOTIFICATION   Patient Details  Name: Laurie Steele MRN: [id number] Date of Birth: 1941/02/13   Medicare Observation Status Notification Given:   Yes     Beau Fanny, RN 06/16/2016, 6:11 PM

## 2016-06-16 NOTE — Progress Notes (Signed)
Pt arrived on unit. First unit of blood was started in the ED. Pt is resting comfortably at this time. ED nurse stayed with pt for the first 15 minutes in the ED prior to bring pt to the floor. Will continue to monitor pt.   Angus Seller

## 2016-06-16 NOTE — Progress Notes (Signed)
Subjective:  Patient presented to the emergency room for sensation of bugs crawling on her skin and anxiousness. During workup in the emergency room, hemoglobin was found to be extremely low at 6.5. She was admitted for further Evaluation. She received 2 units of blood transfusion overnight. At present, she is lethargic but able to answer questions. Denies any acute shortness of breath. Denies any bleeding episodes.  Objective:  Vital signs in last 24 hours:  Temp:  [98.1 F (36.7 C)-98.8 F (37.1 C)] 98.7 F (37.1 C) (08/12 0207) Pulse Rate:  [74-85] 75 (08/12 0207) Resp:  [16-20] 17 (08/12 0207) BP: (119-160)/(42-84) 157/61 (08/12 0207) SpO2:  [95 %-100 %] 98 % (08/12 0207) Weight:  [96.2 kg (212 lb 3 oz)-99.8 kg (220 lb)] 96.2 kg (212 lb 3 oz) (08/11 1900)  Weight change:  Filed Weights   06/15/16 1548 06/15/16 1900  Weight: 99.8 kg (220 lb) 96.2 kg (212 lb 3 oz)    Intake/Output:    Intake/Output Summary (Last 24 hours) at 06/16/16 0947 Last data filed at 06/16/16 0533  Gross per 24 hour  Intake              947 ml  Output              325 ml  Net              622 ml     Physical Exam: General: No acute distress, laying in bed.  HEENT Anicteric, moist mucous membranes   Neck Supple   Pulm/lungs Lungs are clear to auscultation bilaterally, normal breathing effort   CVS/Heart Regular rhythm, no rub or gallop   Abdomen:  Soft, nontender, bowel sounds present  Extremities: Trace peripheral edema.    Neurologic: Sleepy but arousable, follows commands  Skin: No acute rashes   Access: PD catheter        Basic Metabolic Panel:   Recent Labs Lab 06/11/16 1611  NA 138  K 3.8  CL 96*  CO2 25  GLUCOSE 180*  BUN 83*  CREATININE 13.20*  CALCIUM 8.7*     CBC:  Recent Labs Lab 06/11/16 1611 06/15/16 1553 06/16/16 0402  WBC 8.8 9.1 9.2  NEUTROABS 6.2  --   --   HGB 6.5* 6.8* 8.8*  HCT 18.9* 20.2* 25.0*  MCV 100.1* 102.5* 96.1  PLT 201 224 209       Microbiology:  Recent Results (from the past 720 hour(s))  Blood culture (routine x 2)     Status: Abnormal   Collection Time: 05/21/16 12:01 PM  Result Value Ref Range Status   Specimen Description BLOOD RIGHT ANTECUBITAL  Final   Special Requests BOTTLES DRAWN AEROBIC AND ANAEROBIC 4CC  Final   Culture  Setup Time   Final    GRAM POSITIVE COCCI ANAEROBIC BOTTLE ONLY CRITICAL RESULT CALLED TO, READ BACK BY AND VERIFIED WITH: NATE COOKSON 05/22/16 0130 TLB/SGD    Culture (A)  Final    STREPTOCOCCUS AGALACTIAE SUSCEPTIBILITIES PERFORMED ON PREVIOUS CULTURE WITHIN THE LAST 5 DAYS. Performed at [facility]    Report Status 05/24/2016 FINAL  Final  Blood culture (routine x 2)     Status: Abnormal   Collection Time: 05/21/16 12:01 PM  Result Value Ref Range Status   Specimen Description BLOOD LEFT HAND  Final   Special Requests BOTTLES DRAWN AEROBIC AND ANAEROBIC  [place]  Final   Culture  Setup Time   Final    GRAM POSITIVE COCCI ANAEROBIC BOTTLE  ONLY CRITICAL RESULT CALLED TO, READ BACK BY AND VERIFIED WITH: NATE COOKSON ON 05/22/16 AT 0130 BY TLB CONFIRMED BY TLB/KB[facility]    Culture STREPTOCOCCUS AGALACTIAE (A)  Final   Report Status 05/24/2016 FINAL  Final   Organism ID, Bacteria STREPTOCOCCUS AGALACTIAE  Final      Susceptibility   Streptococcus agalactiae - MIC*    CLINDAMYCIN <=0.25 SENSITIVE Sensitive     AMPICILLIN <=0.25 SENSITIVE Sensitive     ERYTHROMYCIN 2 RESISTANT Resistant     VANCOMYCIN 0.5 SENSITIVE Sensitive     CEFTRIAXONE <=0.12 SENSITIVE Sensitive     LEVOFLOXACIN 0.5 SENSITIVE Sensitive     * STREPTOCOCCUS AGALACTIAE  Blood Culture ID Panel (Reflexed)     Status: Abnormal   Collection Time: 05/21/16 12:01 PM  Result Value Ref Range Status   Enterococcus species NOT DETECTED NOT DETECTED Final   Vancomycin resistance NOT DETECTED NOT DETECTED Final   Listeria monocytogenes NOT DETECTED NOT DETECTED Final   Staphylococcus species NOT DETECTED NOT  DETECTED Final   Staphylococcus aureus NOT DETECTED NOT DETECTED Final   Methicillin resistance NOT DETECTED NOT DETECTED Final   Streptococcus species DETECTED (A) NOT DETECTED Final    Comment: CRITICAL RESULT CALLED TO, READ BACK BY AND VERIFIED WITH: NATE COOKSON ON 05/22/16 AT 0130 BY TLB    Streptococcus agalactiae DETECTED (A) NOT DETECTED Final    Comment: CRITICAL RESULT CALLED TO, READ BACK BY AND VERIFIED WITH: NATE COOKSON ON 05/22/16 AT 0130 BY TLB    Streptococcus pneumoniae NOT DETECTED NOT DETECTED Final   Streptococcus pyogenes NOT DETECTED NOT DETECTED Final   Acinetobacter baumannii NOT DETECTED NOT DETECTED Final   Enterobacteriaceae species NOT DETECTED NOT DETECTED Final   Enterobacter cloacae complex NOT DETECTED NOT DETECTED Final   Escherichia coli NOT DETECTED NOT DETECTED Final   Klebsiella oxytoca NOT DETECTED NOT DETECTED Final   Klebsiella pneumoniae NOT DETECTED NOT DETECTED Final   Proteus species NOT DETECTED NOT DETECTED Final   Serratia marcescens NOT DETECTED NOT DETECTED Final   Carbapenem resistance NOT DETECTED NOT DETECTED Final   Haemophilus influenzae NOT DETECTED NOT DETECTED Final   Neisseria meningitidis NOT DETECTED NOT DETECTED Final   Pseudomonas aeruginosa NOT DETECTED NOT DETECTED Final   Candida albicans NOT DETECTED NOT DETECTED Final   Candida glabrata NOT DETECTED NOT DETECTED Final   Candida krusei NOT DETECTED NOT DETECTED Final   Candida parapsilosis NOT DETECTED NOT DETECTED Final   Candida tropicalis NOT DETECTED NOT DETECTED Final  Body fluid culture     Status: None   Collection Time: 05/21/16 10:31 PM  Result Value Ref Range Status   Specimen Description PERITONEAL  Final   Special Requests Normal  Final   Gram Stain NO WBC SEEN NO ORGANISMS SEEN   Final   Culture   Final    NO GROWTH 3 DAYS Performed at [facility]    Report Status 05/25/2016 FINAL  Final  CULTURE, BLOOD (ROUTINE X 2) w Reflex to ID Panel      Status: None   Collection Time: 05/24/16 10:10 AM  Result Value Ref Range Status   Specimen Description BLOOD RIGHT ASSIST CONTROL  Final   Special Requests BOTTLES DRAWN AEROBIC AND ANAEROBIC  8CC  Final   Culture NO GROWTH 5 DAYS  Final   Report Status 05/29/2016 FINAL  Final  CULTURE, BLOOD (ROUTINE X 2) w Reflex to ID Panel     Status: None   Collection Time: 05/24/16  10:10 AM  Result Value Ref Range Status   Specimen Description BLOOD LEFT ASSIST CONTROL  Final   Special Requests BOTTLES DRAWN AEROBIC AND ANAEROBIC  [place]  Final   Culture NO GROWTH 5 DAYS  Final   Report Status 05/29/2016 FINAL  Final  Urine culture     Status: Abnormal   Collection Time: 05/24/16 11:15 AM  Result Value Ref Range Status   Specimen Description URINE, CATHETERIZED  Final   Special Requests Normal  Final   Culture >=100,000 COLONIES/mL YEAST (A)  Final   Report Status 05/25/2016 FINAL  Final    Coagulation Studies:  Recent Labs  06/15/16 1553 06/16/16 0402  LABPROT 15.1 13.9  INR 1.18 1.07    Urinalysis: No results for input(s): COLORURINE, LABSPEC, PHURINE, GLUCOSEU, HGBUR, BILIRUBINUR, KETONESUR, PROTEINUR, UROBILINOGEN, NITRITE, LEUKOCYTESUR in the last 72 hours.  Invalid input(s): APPERANCEUR    Imaging: No results found.   Medications:   . sodium chloride 75 mL/hr at 06/15/16 2252   . sodium chloride  10 mL/hr Intravenous Once  . allopurinol  100 mg Oral Daily  . amLODipine  10 mg Oral QAC lunch  . aspirin EC  325 mg Oral Daily  . atenolol  25 mg Oral Daily  . cinacalcet  90 mg Oral Q supper  . cloNIDine  0.3 mg Oral BID  . epoetin (EPOGEN/PROCRIT) injection  20,000 Units Subcutaneous Weekly  . ferrous sulfate  325 mg Oral Q breakfast  . hydrALAZINE  50 mg Oral TID  . insulin aspart  0-12 Units Subcutaneous TID AC & HS  . insulin glargine  42 Units Subcutaneous QHS  . isosorbide mononitrate  30 mg Oral BID  . latanoprost  1 drop Both Eyes QHS  . losartan  100 mg  Oral Daily  . sevelamer carbonate  2,400 mg Oral TID WC  . sodium chloride flush  3 mL Intravenous Q12H   acetaminophen **OR** acetaminophen, amitriptyline, HYDROcodone-acetaminophen, metoCLOPramide, ondansetron **OR** ondansetron (ZOFRAN) IV  Assessment/ Plan:  75 y.o. African American female end-stage renal disease, peritoneal dialysis, insulin-dependent diabetes, with complications of retinopathy, nephropathy, glaucoma, GERD, history of gout, hypertension, hyperlipidemia  1. End-stage renal disease We will continue her home peritoneal dialysis prescription. 1700 cc 6 exchanges 2.5% and 1.5% PD fluid   2. Secondary hyperparathyroidism Continue to periodically monitor phosphorus.  Continue Renvela for now.  3. Anemia of chronic kidney disease Hemoglobin currently 6.5 Patient received 2 units of blood overnight. Previously, on July 22, she had received another unit of blood Hemoglobin is low despite getting Procrit GI workup in progress       LOS: 0 Shaquella Stamant 8/12/20179:47 AM

## 2016-06-16 NOTE — Progress Notes (Signed)
Second of unit of RBC is completed at [facility] 06/16/2016. VSS. Pt is resting comfortably. Will continue to monitor pt.   Angus Seller

## 2016-06-16 NOTE — Progress Notes (Signed)
ACCESSED PD, 10 HOUR DWELL

## 2016-06-16 NOTE — Care Management Note (Addendum)
Case Management Note  Patient Details  Name: Laurie Steele MRN: [id number] Date of Birth: August 03, 1941  Subjective/Objective:   75yo Mrs Laurie Steele was last discharged from [facility] on 05/28/16. Her last ED visit was 06/11/16. She was Admitted to [facility] again on 06/15/16 per weakness and low hemoglobin per chronic CKD anemia. She lives at home with her son. PCP= Dr Ginette Pitman. She is followed by Dr Holley Raring for home Peritoneal Dialysis. Pharmacy= [facility]. Still open to [place] for RN and Aide, but no PT. Case management will follow for discharge planning.                   Action/Plan:   Expected Discharge Date:                  Expected Discharge Plan:     In-House Referral:     Discharge planning Services     Post Acute Care Choice:    Choice offered to:     DME Arranged:    DME Agency:     HH Arranged:    HH Agency:     Status of Service:     If discussed at [employer] of Stay Meetings, dates discussed:    Additional Comments:  Emidio Warrell A, RN 06/16/2016, 1:58 PM

## 2016-06-16 NOTE — Consult Note (Signed)
Consultation  Referring Provider:      Primary Care Physician:  [name], MD Primary Gastroenterologist:         Reason for Consultation:      Laurie Steele is a 75 y.o. female with a known history of chronic anemia, end-stage renal disease on peritoneal dialysis, hypertension, hyperlipidemia, diabetes who had blood work done recently by her primary care provider and was called today and instructed to go to the emergency department for evaluation secondary to low hemoglobin and hematocrit. Patient states that she has had fatigue, weakness but denies any shortness of breath, chest pain, dizziness, lightheadedness. She denies any hematemesis, hematochezia or melena.  HGB was 6.8 but typically is in low 7.0's.   Impression / Plan:   Anemia: Suspect from chronic renal disease.  S/P 2 units of PRBCS last night. No hemoccult reported on admission but occult negative in 05-24-2016.  No records of EGD or colonoscopy since 2007.  Reports no NSAID use.   Obtain stool hemoccults. If positive needs at EGD/colonoscopy as inpatient. Place on po or IV iron supplementation.  Obtain folate and B12 levels.           HPI:   Laurie Steele is a 75 y.o. female   Past Medical History:  Diagnosis Date  . Chronic anemia    Dr Inez Pilgrim  . GERD (gastroesophageal reflux disease)   . Glaucoma   . Gout   . Hx of colonic polyps   . Hyperlipidemia   . Hypertension   . Lower back pain   . Miscarriage   . 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 Peritoneal dialysis status ([place])   . Renal failure    Dr Holley Raring  . 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 Vaginal delivery    x 4    Past Surgical History:  Procedure Laterality Date  . ABDOMINAL HYSTERECTOMY  1983  . BACK SURGERY  6/08   Dr Collier Salina  . BACK SURGERY  1987   disc  . CHOLECYSTECTOMY    . PERITONEAL CATHETER  INSERTION      Family History  Problem Relation Age of Onset  . Heart attack Father   . Hypertension Mother   . Hyperlipidemia Mother   . Coronary artery disease      strong fam hx  . Breast cancer Neg Hx      Social History  Substance Use Topics  . Smoking status: Former Smoker    Quit date: 11/05/1990  . Smokeless tobacco: Never Used  . Alcohol use No    Prior to Admission medications   Medication Sig Start Date End Date Taking? Authorizing Provider  allopurinol (ZYLOPRIM) 100 MG tablet Take 1 tablet by mouth daily.    Yes Historical Provider, MD  amitriptyline (ELAVIL) 25 MG tablet Take 1 tablet by mouth at bedtime as needed (For foot pain).  01/02/16  Yes Historical Provider, MD  amLODipine (NORVASC) 10 MG tablet Take 10 mg by mouth daily. Take it mid-day 03/20/16  Yes Historical Provider, MD  aspirin EC 325 MG tablet Take 325 mg by mouth daily.   Yes Historical Provider, MD  atenolol (TENORMIN) 25 MG tablet Take 1 tablet by mouth daily.   Yes Historical Provider, MD  cloNIDine (CATAPRES) 0.3 MG tablet Take 0.3 mg by mouth 2 (two) times daily.   Yes Historical Provider,  MD  hydrALAZINE (APRESOLINE) 50 MG tablet Take 50 mg by mouth 3 (three) times daily.     Yes Historical Provider, MD  insulin glargine (LANTUS) 100 UNIT/ML injection Inject 0.42 mLs (42 Units total) into the skin at bedtime. 05/28/16  Yes Fritzi Mandes, MD  insulin lispro protamine-lispro (HUMALOG 50/50 MIX) (50-50) 100 UNIT/ML SUSP injection Inject 0-5 Units into the skin at bedtime. Per sliding scale   Yes Historical Provider, MD  latanoprost (XALATAN) 0.005 % ophthalmic solution Place 1 drop into both eyes at bedtime. 03/20/16  Yes Historical Provider, MD  lidocaine (XYLOCAINE) 5 % ointment Apply 1 application topically 3 (three) times daily as needed.   Yes Historical Provider, MD  losartan (COZAAR) 100 MG tablet Take 100 mg by mouth daily. 03/20/16  Yes Historical Provider, MD  metoCLOPramide (REGLAN) 10 MG tablet  Take 10 mg by mouth 3 (three) times daily as needed for nausea.    Yes Historical Provider, MD  ALPRAZolam Duanne Moron) 0.25 MG tablet Take 1 tablet by mouth at bedtime as needed for sleep.     Historical Provider, MD  B Complex-C-Folic Acid (DIALYVITE Q000111Q PO) Take 1 tablet by mouth daily.    Historical Provider, MD  cinacalcet (SENSIPAR) 90 MG tablet Take 90 mg by mouth daily. With lunch    Historical Provider, MD  gabapentin (NEURONTIN) 100 MG capsule Take 1 capsule by mouth 3 (three) times daily. 08/09/15 08/08/16  Historical Provider, MD  HYDROcodone-acetaminophen (NORCO/VICODIN) 5-325 MG tablet Take 1 tablet by mouth 2 (two) times daily as needed for moderate pain.     Historical Provider, MD  insulin aspart (NOVOLOG) 100 UNIT/ML injection Inject 0-5 Units into the skin at bedtime. Patient not taking: Reported on 06/15/2016 05/28/16   Fritzi Mandes, MD  ipratropium (ATROVENT) 0.03 % nasal spray Place 1 spray into both nostrils 3 (three) times daily as needed for rhinitis.    Historical Provider, MD  isosorbide mononitrate (IMDUR) 30 MG 24 hr tablet Take 30 mg by mouth 2 (two) times daily.    Historical Provider, MD  sevelamer (RENVELA) 800 MG tablet Take 2,400 mg by mouth 3 (three) times daily with meals.     Historical Provider, MD    Current Facility-Administered Medications  Medication Dose Route Frequency Provider Last Rate Last Dose  . 0.9 %  sodium chloride infusion  10 mL/hr Intravenous Once Daymon Larsen, MD      . 0.9 %  sodium chloride infusion   Intravenous Continuous Murlean Iba, MD 75 mL/hr at 06/15/16 2252    . acetaminophen (TYLENOL) tablet 650 mg  650 mg Oral Q6H PRN Alexis Hugelmeyer, DO   650 mg at 06/15/16 2317   Or  . acetaminophen (TYLENOL) suppository 650 mg  650 mg Rectal Q6H PRN Alexis Hugelmeyer, DO      . allopurinol (ZYLOPRIM) tablet 100 mg  100 mg Oral Daily Alexis Hugelmeyer, DO   100 mg at 06/16/16 0937  . amitriptyline (ELAVIL) tablet 25 mg  25 mg Oral QHS PRN Alexis  Hugelmeyer, DO      . amLODipine (NORVASC) tablet 10 mg  10 mg Oral QAC lunch Alexis Hugelmeyer, DO   10 mg at 06/16/16 1326  . aspirin EC tablet 325 mg  325 mg Oral Daily Alexis Hugelmeyer, DO   325 mg at 06/16/16 [id number]  . atenolol (TENORMIN) tablet 25 mg  25 mg Oral Daily Alexis Hugelmeyer, DO   25 mg at 06/16/16 [id number]  . cinacalcet (SENSIPAR) tablet 90 mg  90 mg Oral Q supper [employer], DO      . cloNIDine (CATAPRES) tablet 0.3 mg  0.3 mg Oral BID Alexis Hugelmeyer, DO   0.3 mg at 06/16/16 [id number]  . dialysis solution 1.5% low-MG/low-CA dianeal solution   Intraperitoneal Q24H Harmeet Singh, MD      . dialysis solution 2.5% low-MG/low-CA dianeal solution   Intraperitoneal Q24H Harmeet Singh, MD      . epoetin alfa (EPOGEN,PROCRIT) injection 20,000 Units  20,000 Units Subcutaneous Weekly Harmeet Singh, MD      . ferrous sulfate tablet 325 mg  325 mg Oral Q breakfast Lance Coon, MD   325 mg at 06/16/16 [id number]  . gentamicin cream (GARAMYCIN) 0.1 % 1 application  1 application Topical Daily Harmeet Singh, MD      . heparin 1000 unit/ml injection 500 Units  500 Units Intraperitoneal PRN Murlean Iba, MD      . hydrALAZINE (APRESOLINE) tablet 50 mg  50 mg Oral TID Alexis Hugelmeyer, DO   50 mg at 06/16/16 [id number]  . HYDROcodone-acetaminophen (NORCO/VICODIN) 5-325 MG per tablet 1-2 tablet  1-2 tablet Oral Q4H PRN Alexis Hugelmeyer, DO   2 tablet at 06/16/16 0108  . insulin aspart (novoLOG) injection 0-12 Units  0-12 Units Subcutaneous TID AC & HS Alexis Hugelmeyer, DO      . insulin glargine (LANTUS) injection 42 Units  42 Units Subcutaneous QHS Alexis Hugelmeyer, DO      . isosorbide mononitrate (IMDUR) 24 hr tablet 30 mg  30 mg Oral BID Alexis Hugelmeyer, DO   30 mg at 06/16/16 0937  . latanoprost (XALATAN) 0.005 % ophthalmic solution 1 drop  1 drop Both Eyes QHS Alexis Hugelmeyer, DO      . losartan (COZAAR) tablet 100 mg  100 mg Oral Daily Alexis Hugelmeyer, DO   100 mg at 06/16/16 0937  .  metoCLOPramide (REGLAN) tablet 10 mg  10 mg Oral TID PRN Alexis Hugelmeyer, DO      . ondansetron (ZOFRAN) tablet 4 mg  4 mg Oral Q6H PRN Alexis Hugelmeyer, DO       Or  . ondansetron (ZOFRAN) injection 4 mg  4 mg Intravenous Q6H PRN Alexis Hugelmeyer, DO      . sevelamer carbonate (RENVELA) tablet 2,400 mg  2,400 mg Oral TID WC Alexis Hugelmeyer, DO   2,400 mg at 06/16/16 1326  . sodium chloride flush (NS) 0.9 % injection 3 mL  3 mL Intravenous Q12H Alexis Hugelmeyer, DO   3 mL at 06/16/16 0939    Allergies as of 06/15/2016  . (No Known Allergies)     Review of Systems:    This is positive for those things mentioned in the HPI. All other review of systems are negative.       Physical Exam:  Vital signs in last 24 hours: Temp:  [98.1 F (36.7 C)-98.8 F (37.1 C)] 98.7 F (37.1 C) (08/12 0207) Pulse Rate:  [74-85] 75 (08/12 0207) Resp:  [16-20] 17 (08/12 0207) BP: (119-160)/(42-84) 145/53 (08/12 1320) SpO2:  [95 %-100 %] 98 % (08/12 0207) Weight:  [96.2 kg (212 lb 3 oz)-99.8 kg (220 lb)] 96.2 kg (212 lb 3 oz) (08/11 1900) Last BM Date: 06/15/16  General:  Well-developed, well-nourished and in no acute distress Eyes:  anicteric. ENT:   Mouth and posterior pharynx free of lesions.  Neck:   supple w/o thyromegaly or mass.  Lungs: Clear to auscultation bilaterally. Heart:  S1S2, no rubs, murmurs, gallops. Abdomen:  soft, non-tender, no  hepatosplenomegaly, hernia, or mass and BS+.  Rectal: Lymph:  no cervical or supraclavicular adenopathy. Extremities:   no edema Skin   no rash. Neuro:  A&O x 3.  Psych:  appropriate mood and  Affect.   Data Reviewed:   LAB RESULTS:  Recent Labs  06/15/16 1553 06/16/16 0402  WBC 9.1 9.2  HGB 6.8* 8.8*  HCT 20.2* 25.0*  PLT 224 209   BMET No results for input(s): NA, K, CL, CO2, GLUCOSE, BUN, CREATININE, CALCIUM in the last 72 hours. LFT No results for input(s): PROT, ALBUMIN, AST, ALT, ALKPHOS, BILITOT, BILIDIR, IBILI in the  last 72 hours. PT/INR  Recent Labs  06/15/16 1553 06/16/16 0402  LABPROT 15.1 13.9  INR 1.18 1.07    STUDIES: No results found.   PREVIOUS ENDOSCOPIES:             Thanks   LOS: 0 days   San Jetty MD. @  06/16/2016, 2:00 PM

## 2016-06-16 NOTE — Progress Notes (Signed)
Lab notified RN of pt having a critical lab of an elevated troponin being 0.05. Prime Doctor was notified, no new orders given at this time. Will continue to monitor pt.   Laurie Steele

## 2016-06-17 DIAGNOSIS — E785 Hyperlipidemia, unspecified: Secondary | ICD-10-CM | POA: Diagnosis present

## 2016-06-17 DIAGNOSIS — E1122 Type 2 diabetes mellitus with diabetic chronic kidney disease: Secondary | ICD-10-CM | POA: Diagnosis not present

## 2016-06-17 DIAGNOSIS — Z7982 Long term (current) use of aspirin: Secondary | ICD-10-CM

## 2016-06-17 DIAGNOSIS — D649 Anemia, unspecified: Secondary | ICD-10-CM | POA: Diagnosis present

## 2016-06-17 DIAGNOSIS — D631 Anemia in chronic kidney disease: Secondary | ICD-10-CM | POA: Diagnosis present

## 2016-06-17 DIAGNOSIS — M109 Gout, unspecified: Secondary | ICD-10-CM | POA: Diagnosis present

## 2016-06-17 DIAGNOSIS — M545 Low back pain: Secondary | ICD-10-CM

## 2016-06-17 DIAGNOSIS — E663 Overweight: Secondary | ICD-10-CM

## 2016-06-17 DIAGNOSIS — H409 Unspecified glaucoma: Secondary | ICD-10-CM | POA: Diagnosis present

## 2016-06-17 DIAGNOSIS — I248 Other forms of acute ischemic heart disease: Secondary | ICD-10-CM | POA: Diagnosis present

## 2016-06-17 DIAGNOSIS — R1310 Dysphagia, unspecified: Secondary | ICD-10-CM | POA: Diagnosis present

## 2016-06-17 DIAGNOSIS — K219 Gastro-esophageal reflux disease without esophagitis: Secondary | ICD-10-CM

## 2016-06-17 DIAGNOSIS — I120 Hypertensive chronic kidney disease with stage 5 chronic kidney disease or end stage renal disease: Secondary | ICD-10-CM | POA: Diagnosis not present

## 2016-06-17 DIAGNOSIS — N186 End stage renal disease: Secondary | ICD-10-CM

## 2016-06-17 DIAGNOSIS — Z9889 Other specified postprocedural states: Secondary | ICD-10-CM | POA: Diagnosis not present

## 2016-06-17 DIAGNOSIS — Z79899 Other long term (current) drug therapy: Secondary | ICD-10-CM

## 2016-06-17 DIAGNOSIS — Z9049 Acquired absence of other specified parts of digestive tract: Secondary | ICD-10-CM | POA: Diagnosis not present

## 2016-06-17 DIAGNOSIS — N2581 Secondary hyperparathyroidism of renal origin: Secondary | ICD-10-CM | POA: Diagnosis present

## 2016-06-17 DIAGNOSIS — Z90710 Acquired absence of both cervix and uterus: Secondary | ICD-10-CM | POA: Diagnosis not present

## 2016-06-17 DIAGNOSIS — Z8249 Family history of ischemic heart disease and other diseases of the circulatory system: Secondary | ICD-10-CM | POA: Diagnosis not present

## 2016-06-17 DIAGNOSIS — E11319 Type 2 diabetes mellitus with unspecified diabetic retinopathy without macular edema: Secondary | ICD-10-CM

## 2016-06-17 DIAGNOSIS — R5383 Other fatigue: Secondary | ICD-10-CM

## 2016-06-17 DIAGNOSIS — Z992 Dependence on renal dialysis: Secondary | ICD-10-CM | POA: Diagnosis not present

## 2016-06-17 DIAGNOSIS — Z87891 Personal history of nicotine dependence: Secondary | ICD-10-CM | POA: Diagnosis not present

## 2016-06-17 DIAGNOSIS — R531 Weakness: Secondary | ICD-10-CM

## 2016-06-17 DIAGNOSIS — Z86010 Personal history of colonic polyps: Secondary | ICD-10-CM

## 2016-06-17 DIAGNOSIS — R0602 Shortness of breath: Secondary | ICD-10-CM

## 2016-06-17 DIAGNOSIS — M19021 Primary osteoarthritis, right elbow: Secondary | ICD-10-CM | POA: Diagnosis present

## 2016-06-17 DIAGNOSIS — Z794 Long term (current) use of insulin: Secondary | ICD-10-CM | POA: Diagnosis not present

## 2016-06-17 DIAGNOSIS — E1140 Type 2 diabetes mellitus with diabetic neuropathy, unspecified: Secondary | ICD-10-CM

## 2016-06-17 LAB — GLUCOSE, CAPILLARY
GLUCOSE-CAPILLARY: 119 mg/dL — AB (ref 65–99)
GLUCOSE-CAPILLARY: 194 mg/dL — AB (ref 65–99)
GLUCOSE-CAPILLARY: 156 mg/dL — AB (ref 65–99)
Glucose-Capillary: 187 mg/dL — ABNORMAL HIGH (ref 65–99)

## 2016-06-17 LAB — OCCULT BLOOD X 1 CARD TO LAB, STOOL: Fecal Occult Bld: NEGATIVE

## 2016-06-17 MED ORDER — PANTOPRAZOLE SODIUM 40 MG PO TBEC
40.0000 mg | DELAYED_RELEASE_TABLET | Freq: Every day | ORAL | Status: DC
  Administered 2016-06-17 – 2016-06-18 (×2): 40 mg via ORAL
  Filled 2016-06-17: qty 1

## 2016-06-17 NOTE — Consult Note (Signed)
GI Inpatient Follow-up Note  Patient Identification: Laurie Steele is a 75 y.o. female present with severe anemia and heme negative stool x 2 in past month. S/P 2 UPRBCS on this admission. Patient does admit to taking BC powders every 4 hours 1-2 weeks ago for chronic right arm pain. Denies using NSAIDS.   Subjective:  Scheduled Inpatient Medications:  . sodium chloride  10 mL/hr Intravenous Once  . allopurinol  100 mg Oral Daily  . amLODipine  10 mg Oral QAC lunch  . aspirin EC  325 mg Oral Daily  . atenolol  25 mg Oral Daily  . cinacalcet  90 mg Oral Q supper  . cloNIDine  0.3 mg Oral BID  . dialysis solution 1.5% low-MG/low-CA   Intraperitoneal Q24H  . dialysis solution 2.5% low-MG/low-CA   Intraperitoneal Q24H  . epoetin (EPOGEN/PROCRIT) injection  20,000 Units Subcutaneous Weekly  . ferrous sulfate  325 mg Oral Q breakfast  . gentamicin cream  1 application Topical Daily  . hydrALAZINE  50 mg Oral TID  . insulin aspart  0-12 Units Subcutaneous TID AC & HS  . insulin glargine  42 Units Subcutaneous QHS  . isosorbide mononitrate  30 mg Oral BID  . latanoprost  1 drop Both Eyes QHS  . losartan  100 mg Oral Daily  . pantoprazole  40 mg Oral Daily  . sevelamer carbonate  2,400 mg Oral TID WC  . sodium chloride flush  3 mL Intravenous Q12H    Continuous Inpatient Infusions:     PRN Inpatient Medications:  acetaminophen **OR** acetaminophen, amitriptyline, heparin, HYDROcodone-acetaminophen, metoCLOPramide, ondansetron **OR** ondansetron (ZOFRAN) IV  Review of Systems:  Constitutional: Weight is stable.  Eyes: No changes in vision. ENT: No oral lesions, sore throat.  GI: see HPI.  Heme/Lymph: No easy bruising.  CV: No chest pain.  GU: No hematuria.  Integumentary: No rashes.  Neuro: No headaches.  Psych: No depression/anxiety.  Endocrine: No heat/cold intolerance.  Allergic/Immunologic: No urticaria.  Resp: No cough, SOB.  Musculoskeletal: No joint swelling.     Physical Examination: BP (!) 145/50 (BP Location: Left Arm)   Pulse 63   Temp 98.5 F (36.9 C) (Oral)   Resp 18   Ht 5\' 7"  (1.702 m)   Wt 99.3 kg (218 lb 14.4 oz)   SpO2 96%   BMI 34.28 kg/m  Gen: NAD, alert and oriented x 4 HEENT: PEERLA, EOMI, Neck: supple, no JVD or thyromegaly Chest: CTA bilaterally, no wheezes, crackles, or other adventitious sounds CV: RRR, no m/g/c/r Abd: soft, NT, ND, +BS in all four quadrants; no HSM, guarding, ridigity, or rebound tenderness Ext: no edema Skin: no rash or lesions noted   Data: Lab Results  Component Value Date   WBC 9.2 06/16/2016   HGB 8.8 (L) 06/16/2016   HCT 25.0 (L) 06/16/2016   MCV 96.1 06/16/2016   PLT 209 06/16/2016    Recent Labs Lab 06/11/16 1611 06/15/16 1553 06/16/16 0402  HGB 6.5* 6.8* 8.8*   Lab Results  Component Value Date   NA 138 06/11/2016   K 3.8 06/11/2016   CL 96 (L) 06/11/2016   CO2 25 06/11/2016   BUN 83 (H) 06/11/2016   CREATININE 13.20 (H) 06/11/2016   Lab Results  Component Value Date   ALT 27 06/11/2016   AST 31 06/11/2016   ALKPHOS 103 06/11/2016   BILITOT 0.4 06/11/2016    Recent Labs Lab 06/15/16 1553 06/16/16 0402  APTT 32  --   INR  1.18 1.07     Assessment/Plan: Laurie Steele is a 75 y.o. female with severe anemia secondary to multiple causes: renal failure and possible GI bleed (although heme negative)  Recommendations:  Patient was instructed to avoid all NSAIDS and ASA products. This may very well be an additional cause of her anemia. Patient needs other medication choices to control pain. Continue on daily PPI therapy.    Please call with questions or concerns.  San Jetty, MD

## 2016-06-17 NOTE — Progress Notes (Signed)
ccpd completed 

## 2016-06-17 NOTE — Consult Note (Signed)
[facility]  Date of admission:  06/15/2016  Inpatient day:  06/17/16  Consulting physician: Dr. Fritzi Mandes  Reason for Consultation:  Acute on chronic anemia; h/o ESRD.  Hemoccult negative.  Chief Complaint: Laurie Steele is a 75 y.o. female with ESRD on peritoneal dialysis who was admitted through the emergency room with symptomatic anemia.  HPI: The patient notes a history of anemia for about 20 years.  She previously saw Dr. Inez Pilgrim.  She underwent bone marrow aspirate and biopsy approximately 15 years ago (no report available).  She states that she was treated with a shot (? Procrit).  She has a history of ESRD on peritoneal dialysis for 8 years.  She has chronic anemia.   Anemia has slowly progressed since the middle of 2017.  Labs in 12/2015 - 04/2016 revealed a hematocrit 28.2 - 34.8 (hemoglobin 9.4 - 11.4).  On 05/23/2016, hematocrit was 17.9 with a hemoglobin of 6.1.  She received 1 unit of PRBCs.  Her hematocrit has continued to drift down.  CBC on 06/11/2016 revealed a hematocrit of 18.9 with a hemoglobin of 6.5.  MCV was 100.1.  CBC by her PCP on 06/13/2016 revealed a hemoglobin of 6.4.  She was directed to the ER.  She states that her diet is good.  She eats chicken and fish.  She avoids lean meat.  She denies pica.  She denies any melena or hematochezia.  She denies any hematuria or vaginal bleeding.  She previously saw Dr. Candace Cruise.  His notes indicate an EGD and colonoscopy in 2013 and a capsule study in 2014 were negative.  Stool hemoccult was negative on 05/24/2016 and 06/17/2016.   She is followed by Dr. Holley Raring in nephrology.  The patient states that she receives Procrit once a month through [facility].   CBC on 06/15/2016 revealed a hematocrit of 20.2, hemoglobin 6.8 and MCV 102.5.  She received 2 units of PRBCs.    The patient has seen by Dr.  San Jetty, gastroenterologist.  Plan is for EGD/colonoscopy if stool guaiac positive.  Labs on 06/16/2016  revealed a ferritin of 895 (11-307), iron saturation 88% (high), and TIBC 167 (low).   Past Medical History:  Diagnosis Date  . Chronic anemia    Dr Inez Pilgrim  . GERD (gastroesophageal reflux disease)   . Glaucoma   . Gout   . Hx of colonic polyps   . Hyperlipidemia   . Hypertension   . Lower back pain   . Miscarriage   . 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 Peritoneal dialysis status ([place])   . Renal failure    Dr Holley Raring  . 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 Vaginal delivery    x 4    Past Surgical History:  Procedure Laterality Date  . ABDOMINAL HYSTERECTOMY  1983  . BACK SURGERY  6/08   Dr Collier Salina  . BACK SURGERY  1987   disc  . CHOLECYSTECTOMY    . PERITONEAL CATHETER INSERTION      Family History  Problem Relation Age of Onset  . Heart attack Father   . Hypertension Mother   . Hyperlipidemia Mother   . Coronary artery disease      strong fam hx  . Breast cancer Neg Hx     Social History:  reports that she quit smoking about 25 years  ago. She has never used smokeless tobacco. She reports that she does not drink alcohol or use drugs.  The patient is alone today.  Allergies: No Known Allergies  Medications Prior to Admission  Medication Sig Dispense Refill  . allopurinol (ZYLOPRIM) 100 MG tablet Take 1 tablet by mouth daily.     Marland Kitchen amitriptyline (ELAVIL) 25 MG tablet Take 1 tablet by mouth at bedtime as needed (For foot pain).   3  . amLODipine (NORVASC) 10 MG tablet Take 10 mg by mouth daily. Take it mid-day  11  . aspirin EC 325 MG tablet Take 325 mg by mouth daily.    Marland Kitchen atenolol (TENORMIN) 25 MG tablet Take 1 tablet by mouth daily.    . cloNIDine (CATAPRES) 0.3 MG tablet Take 0.3 mg by mouth 2 (two) times daily.    . hydrALAZINE (APRESOLINE) 50 MG tablet Take 50 mg by mouth 3 (three) times daily.      . insulin glargine (LANTUS)  100 UNIT/ML injection Inject 0.42 mLs (42 Units total) into the skin at bedtime. 7 vial 3  . insulin lispro protamine-lispro (HUMALOG 50/50 MIX) (50-50) 100 UNIT/ML SUSP injection Inject 0-5 Units into the skin at bedtime. Per sliding scale    . latanoprost (XALATAN) 0.005 % ophthalmic solution Place 1 drop into both eyes at bedtime.  6  . lidocaine (XYLOCAINE) 5 % ointment Apply 1 application topically 3 (three) times daily as needed.    Marland Kitchen losartan (COZAAR) 100 MG tablet Take 100 mg by mouth daily.  11  . metoCLOPramide (REGLAN) 10 MG tablet Take 10 mg by mouth 3 (three) times daily as needed for nausea.     . ALPRAZolam (XANAX) 0.25 MG tablet Take 1 tablet by mouth at bedtime as needed for sleep.     . B Complex-C-Folic Acid (DIALYVITE Q000111Q PO) Take 1 tablet by mouth daily.    . cinacalcet (SENSIPAR) 90 MG tablet Take 90 mg by mouth daily. With lunch    . gabapentin (NEURONTIN) 100 MG capsule Take 1 capsule by mouth 3 (three) times daily.    Marland Kitchen HYDROcodone-acetaminophen (NORCO/VICODIN) 5-325 MG tablet Take 1 tablet by mouth 2 (two) times daily as needed for moderate pain.     Marland Kitchen insulin aspart (NOVOLOG) 100 UNIT/ML injection Inject 0-5 Units into the skin at bedtime. (Patient not taking: Reported on 06/15/2016) 10 mL 11  . ipratropium (ATROVENT) 0.03 % nasal spray Place 1 spray into both nostrils 3 (three) times daily as needed for rhinitis.    Marland Kitchen isosorbide mononitrate (IMDUR) 30 MG 24 hr tablet Take 30 mg by mouth 2 (two) times daily.    . sevelamer (RENVELA) 800 MG tablet Take 2,400 mg by mouth 3 (three) times daily with meals.       Review of Systems: GENERAL: Fatigue.  Weakness.  No fevers, sweats or weight loss. PERFORMANCE STATUS (ECOG):  1-2 HEENT:  No visual changes, runny nose, sore throat, mouth sores or tenderness. Lungs: Slight increase in shortness of breath.  No cough.  No hemoptysis. Cardiac:  No chest pain, palpitations, orthopnea, or PND. GI:  No nausea, vomiting, diarrhea,  constipation, melena or hematochezia. GU:  Peritoneal dialysis.  Makes some urine.  No urgency, frequency, dysuria, or hematuria. Musculoskeletal:  No back pain.  No joint pain.  No muscle tenderness. Extremities:  No pain or swelling. Skin:  No rashes or skin changes. Neuro:  No headache, numbness or weakness, balance or coordination issues. Endocrine:  Diabetes.  No thyroid  issues, hot flashes or night sweats. Psych:  No mood changes, depression or anxiety. Pain:  No focal pain. Review of systems:  All other systems reviewed and found to be negative.  Physical Exam:  Blood pressure (!) 110/55, pulse 61, temperature 97.6 F (36.4 C), temperature source Oral, resp. rate 17, height 5\' 7"  (1.702 m), weight 218 lb 14.4 oz (99.3 kg), SpO2 97 %.  GENERAL:  Well developed, well nourished, overweight woman sitting comfortably on the medical unit in no acute distress. MENTAL STATUS:  Alert and oriented to person, place and time. HEAD:  Short gray hair.  Normocephalic, atraumatic, face symmetric, no Cushingoid features. EYES:  Glasses.  Brown eyes.  Pupils equal round and reactive to light and accomodation.  No conjunctivitis or scleral icterus. ENT:  Oropharynx clear without lesion.  Tongue normal. Mucous membranes moist.  RESPIRATORY:  Clear to auscultation without rales, wheezes or rhonchi. CARDIOVASCULAR:  Regular rate and rhythm without murmur, rub or gallop. ABDOMEN:  Peritoneal dialysis catheter.  Soft, non-tender, with active bowel sounds, and no appreciable hepatosplenomegaly.  No masses. SKIN:  No rashes, ulcers or lesions. EXTREMITIES: No edema, no skin discoloration or tenderness.  No palpable cords. LYMPH NODES: No palpable cervical, supraclavicular, axillary or inguinal adenopathy  NEUROLOGICAL: Unremarkable. PSYCH:  Appropriate.   Results for orders placed or performed during the hospital encounter of 06/15/16 (from the past 48 hour(s))  Prepare RBC     Status: None   Collection  Time: 06/15/16  4:21 PM  Result Value Ref Range   Order Confirmation ORDER PROCESSED BY BLOOD BANK   Prepare RBC     Status: None   Collection Time: 06/15/16  7:30 PM  Result Value Ref Range   Order Confirmation ORDER PROCESSED BY BLOOD BANK   Glucose, capillary     Status: Abnormal   Collection Time: 06/15/16  9:35 PM  Result Value Ref Range   Glucose-Capillary 150 (H) 65 - 99 mg/dL  CBC     Status: Abnormal   Collection Time: 06/16/16  4:02 AM  Result Value Ref Range   WBC 9.2 3.6 - 11.0 K/uL   RBC 2.60 (L) 3.80 - 5.20 MIL/uL   Hemoglobin 8.8 (L) 12.0 - 16.0 g/dL    Comment: RESULT REPEATED AND VERIFIED   HCT 25.0 (L) 35.0 - 47.0 %   MCV 96.1 80.0 - 100.0 fL   MCH 34.1 (H) 26.0 - 34.0 pg   MCHC 35.4 32.0 - 36.0 g/dL   RDW 20.7 (H) 11.5 - 14.5 %   Platelets 209 150 - 440 K/uL  Troponin I (q 6hr x 3)     Status: Abnormal   Collection Time: 06/16/16  4:02 AM  Result Value Ref Range   Troponin I 0.05 (HH) <0.03 ng/mL    Comment: CRITICAL RESULT CALLED TO, READ BACK BY AND VERIFIED WITH LAUREN HOBBS AT [id number] 06/16/16.PMH  Iron and TIBC     Status: Abnormal   Collection Time: 06/16/16  4:02 AM  Result Value Ref Range   Iron 146 28 - 170 ug/dL   TIBC 167 (L) 250 - 450 ug/dL   Saturation Ratios 88 (H) 10.4 - 31.8 %   UIBC 21 ug/dL  Ferritin     Status: Abnormal   Collection Time: 06/16/16  4:02 AM  Result Value Ref Range   Ferritin 895 (H) 11 - 307 ng/mL  Protime-INR     Status: None   Collection Time: 06/16/16  4:02 AM  Result Value Ref  Range   Prothrombin Time 13.9 11.4 - 15.2 seconds   INR 1.07   Glucose, capillary     Status: Abnormal   Collection Time: 06/16/16  7:42 AM  Result Value Ref Range   Glucose-Capillary 138 (H) 65 - 99 mg/dL  Glucose, capillary     Status: Abnormal   Collection Time: 06/16/16 11:25 AM  Result Value Ref Range   Glucose-Capillary 119 (H) 65 - 99 mg/dL  Glucose, capillary     Status: Abnormal   Collection Time: 06/16/16  4:39 PM  Result  Value Ref Range   Glucose-Capillary 135 (H) 65 - 99 mg/dL  Glucose, capillary     Status: Abnormal   Collection Time: 06/16/16  9:02 PM  Result Value Ref Range   Glucose-Capillary 237 (H) 65 - 99 mg/dL  Glucose, capillary     Status: Abnormal   Collection Time: 06/17/16  7:29 AM  Result Value Ref Range   Glucose-Capillary 119 (H) 65 - 99 mg/dL   Comment 1 Notify RN   Occult blood card to lab, stool     Status: None   Collection Time: 06/17/16  9:50 AM  Result Value Ref Range   Fecal Occult Bld NEGATIVE NEGATIVE  Glucose, capillary     Status: Abnormal   Collection Time: 06/17/16 11:26 AM  Result Value Ref Range   Glucose-Capillary 187 (H) 65 - 99 mg/dL   Comment 1 Notify RN    No results found.  Assessment:  The patient is a 75 y.o. woman with ESRD on peritoneal dialysis x 8 years who was admitted with symptomatic anemia.  She has had progressive anemia since 04/2016 requiring transfusion x 2 (05/23/2016 and 06/15/2016).    She receives Procrit monthly.  Diet is good.  She denies any hematuria, vaginal bleeding, melena or hematochezia.  EGD and colonoscopy in 2013 and a capsule study in 2014 were negative.  Stool hemoccult was negative on 05/24/2016 and 06/17/2016.   Plan:   1.  Labs:  B12, folate, retic, TSH, free T4, SPEP, [place]. 2.  Guaiac all stools. 3.  Will obtain old records from Dr. Inez Pilgrim including bone marrow. 4.  Anticipate follow-up in the outpatient department.  Thank you for allowing me to participate in ENIA VIVENZIO 's care.  I will follow her closely with you while hospitalized and after discharge in the outpatient department.   Lequita Asal, MD  06/17/2016, 4:12 PM

## 2016-06-17 NOTE — Progress Notes (Signed)
Subjective:  Patient presented to the emergency room for sensation of bugs crawling on her skin and anxiousness. During workup in the emergency room, hemoglobin was found to be extremely low at 6.5. She was admitted for further Evaluation. She received 2 units of blood transfusion overnight. At present, she feels well Tolerated PD well GI work up in progress Hemocult stool negative this morning  Objective:  Vital signs in last 24 hours:  Temp:  [98.2 F (36.8 C)-98.5 F (36.9 C)] 98.5 F (36.9 C) (08/13 0425) Pulse Rate:  [63] 63 (08/13 0425) Resp:  [18] 18 (08/13 0425) BP: (119-145)/(50-53) 145/50 (08/13 0425) SpO2:  [96 %-97 %] 96 % (08/13 0425) Weight:  [99.3 kg (218 lb 14.4 oz)] 99.3 kg (218 lb 14.4 oz) (08/13 0500)  Weight change: -0.499 kg (-1 lb 1.6 oz) Filed Weights   06/15/16 1548 06/15/16 1900 06/17/16 0500  Weight: 99.8 kg (220 lb) 96.2 kg (212 lb 3 oz) 99.3 kg (218 lb 14.4 oz)    Intake/Output:    Intake/Output Summary (Last 24 hours) at 06/17/16 1002 Last data filed at 06/17/16 0425  Gross per 24 hour  Intake             1250 ml  Output              350 ml  Net              900 ml     Physical Exam: General: No acute distress, laying in bed.  HEENT Anicteric, moist mucous membranes   Neck Supple   Pulm/lungs Lungs are clear to auscultation bilaterally, normal breathing effort   CVS/Heart Regular rhythm, no rub or gallop   Abdomen:  Soft, nontender, bowel sounds present  Extremities: Trace peripheral edema.    Neurologic: Alert, oriented, follows commands  Skin: No acute rashes   Access: PD catheter        Basic Metabolic Panel:   Recent Labs Lab 06/11/16 1611  NA 138  K 3.8  CL 96*  CO2 25  GLUCOSE 180*  BUN 83*  CREATININE 13.20*  CALCIUM 8.7*     CBC:  Recent Labs Lab 06/11/16 1611 06/15/16 1553 06/16/16 0402  WBC 8.8 9.1 9.2  NEUTROABS 6.2  --   --   HGB 6.5* 6.8* 8.8*  HCT 18.9* 20.2* 25.0*  MCV 100.1* 102.5* 96.1  PLT  201 224 209      Microbiology:  Recent Results (from the past 720 hour(s))  Blood culture (routine x 2)     Status: Abnormal   Collection Time: 05/21/16 12:01 PM  Result Value Ref Range Status   Specimen Description BLOOD RIGHT ANTECUBITAL  Final   Special Requests BOTTLES DRAWN AEROBIC AND ANAEROBIC 4CC  Final   Culture  Setup Time   Final    GRAM POSITIVE COCCI ANAEROBIC BOTTLE ONLY CRITICAL RESULT CALLED TO, READ BACK BY AND VERIFIED WITH: NATE COOKSON 05/22/16 0130 TLB/SGD    Culture (A)  Final    STREPTOCOCCUS AGALACTIAE SUSCEPTIBILITIES PERFORMED ON PREVIOUS CULTURE WITHIN THE LAST 5 DAYS. Performed at [facility]    Report Status 05/24/2016 FINAL  Final  Blood culture (routine x 2)     Status: Abnormal   Collection Time: 05/21/16 12:01 PM  Result Value Ref Range Status   Specimen Description BLOOD LEFT HAND  Final   Special Requests BOTTLES DRAWN AEROBIC AND ANAEROBIC  [facility]  Final   Culture  Setup Time   Final  GRAM POSITIVE COCCI ANAEROBIC BOTTLE ONLY CRITICAL RESULT CALLED TO, READ BACK BY AND VERIFIED WITH: NATE COOKSON ON 05/22/16 AT 0130 BY TLB CONFIRMED BY TLB/[facility]    Culture STREPTOCOCCUS AGALACTIAE (A)  Final   Report Status 05/24/2016 FINAL  Final   Organism ID, Bacteria STREPTOCOCCUS AGALACTIAE  Final      Susceptibility   Streptococcus agalactiae - MIC*    CLINDAMYCIN <=0.25 SENSITIVE Sensitive     AMPICILLIN <=0.25 SENSITIVE Sensitive     ERYTHROMYCIN 2 RESISTANT Resistant     VANCOMYCIN 0.5 SENSITIVE Sensitive     CEFTRIAXONE <=0.12 SENSITIVE Sensitive     LEVOFLOXACIN 0.5 SENSITIVE Sensitive     * STREPTOCOCCUS AGALACTIAE  Blood Culture ID Panel (Reflexed)     Status: Abnormal   Collection Time: 05/21/16 12:01 PM  Result Value Ref Range Status   Enterococcus species NOT DETECTED NOT DETECTED Final   Vancomycin resistance NOT DETECTED NOT DETECTED Final   Listeria monocytogenes NOT DETECTED NOT DETECTED Final   Staphylococcus species NOT  DETECTED NOT DETECTED Final   Staphylococcus aureus NOT DETECTED NOT DETECTED Final   Methicillin resistance NOT DETECTED NOT DETECTED Final   Streptococcus species DETECTED (A) NOT DETECTED Final    Comment: CRITICAL RESULT CALLED TO, READ BACK BY AND VERIFIED WITH: NATE COOKSON ON 05/22/16 AT 0130 BY TLB    Streptococcus agalactiae DETECTED (A) NOT DETECTED Final    Comment: CRITICAL RESULT CALLED TO, READ BACK BY AND VERIFIED WITH: NATE COOKSON ON 05/22/16 AT 0130 BY TLB    Streptococcus pneumoniae NOT DETECTED NOT DETECTED Final   Streptococcus pyogenes NOT DETECTED NOT DETECTED Final   Acinetobacter baumannii NOT DETECTED NOT DETECTED Final   Enterobacteriaceae species NOT DETECTED NOT DETECTED Final   Enterobacter cloacae complex NOT DETECTED NOT DETECTED Final   Escherichia coli NOT DETECTED NOT DETECTED Final   Klebsiella oxytoca NOT DETECTED NOT DETECTED Final   Klebsiella pneumoniae NOT DETECTED NOT DETECTED Final   Proteus species NOT DETECTED NOT DETECTED Final   Serratia marcescens NOT DETECTED NOT DETECTED Final   Carbapenem resistance NOT DETECTED NOT DETECTED Final   Haemophilus influenzae NOT DETECTED NOT DETECTED Final   Neisseria meningitidis NOT DETECTED NOT DETECTED Final   Pseudomonas aeruginosa NOT DETECTED NOT DETECTED Final   Candida albicans NOT DETECTED NOT DETECTED Final   Candida glabrata NOT DETECTED NOT DETECTED Final   Candida krusei NOT DETECTED NOT DETECTED Final   Candida parapsilosis NOT DETECTED NOT DETECTED Final   Candida tropicalis NOT DETECTED NOT DETECTED Final  Body fluid culture     Status: None   Collection Time: 05/21/16 10:31 PM  Result Value Ref Range Status   Specimen Description PERITONEAL  Final   Special Requests Normal  Final   Gram Stain NO WBC SEEN NO ORGANISMS SEEN   Final   Culture   Final    NO GROWTH 3 DAYS Performed at [facility]    Report Status 05/25/2016 FINAL  Final  CULTURE, BLOOD (ROUTINE X 2) w Reflex  to ID Panel     Status: None   Collection Time: 05/24/16 10:10 AM  Result Value Ref Range Status   Specimen Description BLOOD RIGHT ASSIST CONTROL  Final   Special Requests BOTTLES DRAWN AEROBIC AND ANAEROBIC  8CC  Final   Culture NO GROWTH 5 DAYS  Final   Report Status 05/29/2016 FINAL  Final  CULTURE, BLOOD (ROUTINE X 2) w Reflex to ID Panel     Status: None  Collection Time: 05/24/16 10:10 AM  Result Value Ref Range Status   Specimen Description BLOOD LEFT ASSIST CONTROL  Final   Special Requests BOTTLES DRAWN AEROBIC AND ANAEROBIC  [facility]  Final   Culture NO GROWTH 5 DAYS  Final   Report Status 05/29/2016 FINAL  Final  Urine culture     Status: Abnormal   Collection Time: 05/24/16 11:15 AM  Result Value Ref Range Status   Specimen Description URINE, CATHETERIZED  Final   Special Requests Normal  Final   Culture >=100,000 COLONIES/mL YEAST (A)  Final   Report Status 05/25/2016 FINAL  Final    Coagulation Studies:  Recent Labs  06/15/16 1553 06/16/16 0402  LABPROT 15.1 13.9  INR 1.18 1.07    Urinalysis: No results for input(s): COLORURINE, LABSPEC, PHURINE, GLUCOSEU, HGBUR, BILIRUBINUR, KETONESUR, PROTEINUR, UROBILINOGEN, NITRITE, LEUKOCYTESUR in the last 72 hours.  Invalid input(s): APPERANCEUR    Imaging: No results found.   Medications:     . sodium chloride  10 mL/hr Intravenous Once  . allopurinol  100 mg Oral Daily  . amLODipine  10 mg Oral QAC lunch  . aspirin EC  325 mg Oral Daily  . atenolol  25 mg Oral Daily  . cinacalcet  90 mg Oral Q supper  . cloNIDine  0.3 mg Oral BID  . dialysis solution 1.5% low-MG/low-CA   Intraperitoneal Q24H  . dialysis solution 2.5% low-MG/low-CA   Intraperitoneal Q24H  . epoetin (EPOGEN/PROCRIT) injection  20,000 Units Subcutaneous Weekly  . ferrous sulfate  325 mg Oral Q breakfast  . gentamicin cream  1 application Topical Daily  . hydrALAZINE  50 mg Oral TID  . insulin aspart  0-12 Units Subcutaneous TID AC & HS  .  insulin glargine  42 Units Subcutaneous QHS  . isosorbide mononitrate  30 mg Oral BID  . latanoprost  1 drop Both Eyes QHS  . losartan  100 mg Oral Daily  . pantoprazole  40 mg Oral Daily  . sevelamer carbonate  2,400 mg Oral TID WC  . sodium chloride flush  3 mL Intravenous Q12H   acetaminophen **OR** acetaminophen, amitriptyline, heparin, HYDROcodone-acetaminophen, metoCLOPramide, ondansetron **OR** ondansetron (ZOFRAN) IV  Assessment/ Plan:  75 y.o. African American female end-stage renal disease, peritoneal dialysis, insulin-dependent diabetes, with complications of retinopathy, nephropathy, glaucoma, GERD, history of gout, hypertension, hyperlipidemia  1. End-stage renal disease We will continue her home peritoneal dialysis prescription. 1700 cc  6 exchanges 2.5% and 1.5% PD fluid   2. Secondary hyperparathyroidism Continue to periodically monitor phosphorus.  Continue Renvela for now.  3. Anemia of chronic kidney disease Hemoglobin was 6.5 at admission Patient received 2 units of blood this admission. Previously, on July 22, she had received another unit of blood Hemoglobin is low despite getting Procrit GI workup in progress Consider reticulocyte count and hematology evaluation       LOS: 0 Laurie Steele 8/13/201710:02 AM

## 2016-06-17 NOTE — Progress Notes (Signed)
[place] [facility] [place] NAME: Laurie Steele    MR#:  [id number]  DATE OF BIRTH:  August 04, 1941  SUBJECTIVE:  Came in after routine blood work showed drop In hgb.  C/o weakness Elbow pain. Pt had been using NSAIDs at home  REVIEW OF SYSTEMS:   Review of Systems  Constitutional: Negative for chills, fever and weight loss.  HENT: Negative for ear discharge, ear pain and nosebleeds.   Eyes: Negative for blurred vision, pain and discharge.  Respiratory: Negative for sputum production, shortness of breath, wheezing and stridor.   Cardiovascular: Negative for chest pain, palpitations, orthopnea and PND.  Gastrointestinal: Negative for abdominal pain, diarrhea, nausea and vomiting.  Genitourinary: Negative for frequency and urgency.  Musculoskeletal: Negative for back pain and joint pain.  Neurological: Positive for weakness. Negative for sensory change, speech change and focal weakness.  Psychiatric/Behavioral: Negative for depression and hallucinations. The patient is not nervous/anxious.   All other systems reviewed and are negative.  Tolerating Diet:yes Tolerating PT: pending   DRUG ALLERGIES:  No Known Allergies  VITALS:  Blood pressure (!) 145/50, pulse 63, temperature 98.5 F (36.9 C), temperature source Oral, resp. rate 18, height 5\' 7"  (1.702 m), weight 99.3 kg (218 lb 14.4 oz), SpO2 96 %.  PHYSICAL EXAMINATION:   Physical Exam  GENERAL:  75 y.o.-year-old patient lying in the bed with no acute distress.  EYES: Pupils equal, round, reactive to light and accommodation. No scleral icterus. Extraocular muscles intact.  HEENT: Head atraumatic, normocephalic. Oropharynx and nasopharynx clear.  NECK:  Supple, no jugular venous distention. No thyroid enlargement, no tenderness.  LUNGS: Normal breath sounds bilaterally, no wheezing, rales, rhonchi. No use of accessory muscles of respiration.  CARDIOVASCULAR: S1, S2 normal. No murmurs,  rubs, or gallops.  ABDOMEN: Soft, nontender, nondistended. Bowel sounds present. No organomegaly or mass. PD cath+ EXTREMITIES: No cyanosis, clubbing or edema b/l.    NEUROLOGIC: Cranial nerves II through XII are intact. No focal Motor or sensory deficits b/l.   PSYCHIATRIC:  patient is alert and oriented x 3.  SKIN: No obvious rash, lesion, or ulcer.   LABORATORY PANEL:  CBC  Recent Labs Lab 06/16/16 0402  WBC 9.2  HGB 8.8*  HCT 25.0*  PLT 209    Chemistries   Recent Labs Lab 06/11/16 1611  NA 138  K 3.8  CL 96*  CO2 25  GLUCOSE 180*  BUN 83*  CREATININE 13.20*  CALCIUM 8.7*  AST 31  ALT 27  ALKPHOS 103  BILITOT 0.4   Cardiac Enzymes  Recent Labs Lab 06/16/16 0402  TROPONINI 0.05*   RADIOLOGY:  No results found. ASSESSMENT AND PLAN:   75 y.o. African American female end-stage renal disease, peritoneal dialysis, insulin-dependent diabetes, with complications of retinopathy, nephropathy, glaucoma, GERD, history of gout, hypertension, hyperlipidemia  1.Anemia of chronic kidney disease Hemoglobin currently 6.5---8.8 Patient received 2 units of blood overnight. Previously, on July 22, she had received another unit of blood Hemoglobin is low despite getting Procrit GI input noted. No plans for luminal eval - Hemoccult stools done at bedside by me NEGATIVE -heme onc consult ?IV iron  2. End-stage renal disease We will continue her home peritoneal dialysis    3.HTN Continue home meds   Case discussed with Care Management/Social Worker. Management plans discussed with the patient, family and they are in agreement.  CODE STATUS: FUll  DVT Prophylaxis: SCD  TOTAL TIME TAKING CARE OF THIS PATIENT: 30 minutes.  >  50% time spent on counselling and coordination of care  POSSIBLE D/C IN2-3 DAYS, DEPENDING ON CLINICAL CONDITION.  Note: This dictation was prepared with Dragon dictation along with smaller phrase technology. Any transcriptional errors that  result from this process are unintentional.  [name] M.D on 06/17/2016 at 1:37 PM  Between 7am to 6pm - Pager - [phone]  After 6pm go to [email or web address] - password [email or web address] [facility]  Office  [phone]  CC: Primary care physician; [name], MD

## 2016-06-17 NOTE — Progress Notes (Signed)
Dialysis started 

## 2016-06-18 ENCOUNTER — Other Ambulatory Visit: Payer: Self-pay | Admitting: *Deleted

## 2016-06-18 ENCOUNTER — Inpatient Hospital Stay: Payer: Medicare Other

## 2016-06-18 DIAGNOSIS — D649 Anemia, unspecified: Secondary | ICD-10-CM

## 2016-06-18 LAB — GLUCOSE, CAPILLARY
GLUCOSE-CAPILLARY: 104 mg/dL — AB (ref 65–99)
Glucose-Capillary: 138 mg/dL — ABNORMAL HIGH (ref 65–99)

## 2016-06-18 LAB — RETICULOCYTES
RBC.: 2.6 MIL/uL — ABNORMAL LOW (ref 3.80–5.20)
Retic Count, Absolute: 54.6 10*3/uL (ref 19.0–183.0)
Retic Ct Pct: 2.1 % (ref 0.4–3.1)

## 2016-06-18 LAB — TYPE AND SCREEN
ABO/RH(D): O POS
Antibody Screen: NEGATIVE
UNIT DIVISION: 0
UNIT DIVISION: 0
Unit division: 0
Unit division: 0

## 2016-06-18 LAB — CBC WITH DIFFERENTIAL/PLATELET
Basophils Absolute: 0 10*3/uL (ref 0–0.1)
Basophils Relative: 1 %
Eosinophils Absolute: 0.2 10*3/uL (ref 0–0.7)
Eosinophils Relative: 2 %
HCT: 25.3 % — ABNORMAL LOW (ref 35.0–47.0)
Hemoglobin: 8.8 g/dL — ABNORMAL LOW (ref 12.0–16.0)
Lymphocytes Relative: 22 %
Lymphs Abs: 1.9 10*3/uL (ref 1.0–3.6)
MCH: 33.9 pg (ref 26.0–34.0)
MCHC: 34.9 g/dL (ref 32.0–36.0)
MCV: 97.2 fL (ref 80.0–100.0)
Monocytes Absolute: 0.9 10*3/uL (ref 0.2–0.9)
Monocytes Relative: 10 %
Neutro Abs: 5.6 10*3/uL (ref 1.4–6.5)
Neutrophils Relative %: 65 %
Platelets: 203 10*3/uL (ref 150–440)
RBC: 2.6 MIL/uL — ABNORMAL LOW (ref 3.80–5.20)
RDW: 20.9 % — ABNORMAL HIGH (ref 11.5–14.5)
WBC: 8.6 10*3/uL (ref 3.6–11.0)

## 2016-06-18 LAB — T4, FREE: Free T4: 0.89 ng/dL (ref 0.61–1.12)

## 2016-06-18 LAB — PREPARE RBC (CROSSMATCH)

## 2016-06-18 LAB — FOLATE: Folate: 28 ng/mL (ref >5.9)

## 2016-06-18 LAB — LACTATE DEHYDROGENASE: LDH: 354 U/L — ABNORMAL HIGH (ref 98–192)

## 2016-06-18 LAB — VITAMIN B12: Vitamin B-12: 683 pg/mL (ref 180–914)

## 2016-06-18 LAB — TSH: TSH: 3.278 u[IU]/mL (ref 0.350–4.500)

## 2016-06-18 LAB — SOLUBLE TRANSFERRIN RECEPTOR: Transferrin Receptor: 22.8 nmol/L (ref 12.2–27.3)

## 2016-06-18 LAB — DAT, POLYSPECIFIC AHG (ARMC ONLY): Polyspecific AHG test: NEGATIVE

## 2016-06-18 MED ORDER — PANTOPRAZOLE SODIUM 40 MG PO TBEC: 40.0000 mg | DELAYED_RELEASE_TABLET | Freq: Every day | ORAL | 1 refills | Status: AC

## 2016-06-18 MED ORDER — ASPIRIN EC 81 MG PO TBEC
81.0000 mg | DELAYED_RELEASE_TABLET | Freq: Every day | ORAL | Status: DC
  Administered 2016-06-18: 81 mg via ORAL
  Filled 2016-06-18: qty 1

## 2016-06-18 MED ORDER — ASPIRIN EC 81 MG PO TBEC: 81.0000 mg | DELAYED_RELEASE_TABLET | Freq: Every day | ORAL | 1 refills | Status: DC

## 2016-06-18 MED ORDER — FERROUS SULFATE 325 (65 FE) MG PO TABS: 325.0000 mg | ORAL_TABLET | Freq: Every day | ORAL | 3 refills | Status: AC

## 2016-06-18 NOTE — Discharge Instructions (Signed)
Resume your peritoneal dialysis as before °

## 2016-06-18 NOTE — Care Management (Signed)
Patient to discharge home today.  Resumption of home health orders placed for RN, PT, and aide.  I have notified Corene Cornea with [facility] of discharge plan.  RNCM signing off

## 2016-06-18 NOTE — Progress Notes (Signed)
Patient discharged home with family.  All discharge instructions reviewed and discharge paperwork given to patient.  Patient verbalized understanding.  IVs removed in tact. All questions and concerns addressed. Patient's son available for transfer home.

## 2016-06-18 NOTE — Progress Notes (Signed)
Subjective:  Patient seen post peritoneal dialysis. She is complaining of dysphagia this a.m. This is apparently a new complaint.  Objective:  Vital signs in last 24 hours:  Temp:  [97.6 F (36.4 C)-98.1 F (36.7 C)] 98 F (36.7 C) (08/14 0521) Pulse Rate:  [55-61] 58 (08/14 1000) Resp:  [16-18] 18 (08/14 1000) BP: (110-136)/(46-69) 126/57 (08/14 1000) SpO2:  [96 %-99 %] 96 % (08/14 0521) Weight:  [106.6 kg (235 lb 0.2 oz)] 106.6 kg (235 lb 0.2 oz) (08/14 0417)  Weight change: 7.308 kg (16 lb 1.8 oz) Filed Weights   06/15/16 1900 06/17/16 0500 06/18/16 0417  Weight: 96.2 kg (212 lb 3 oz) 99.3 kg (218 lb 14.4 oz) 106.6 kg (235 lb 0.2 oz)    Intake/Output:    Intake/Output Summary (Last 24 hours) at 06/18/16 1100 Last data filed at 06/18/16 0800  Gross per 24 hour  Intake              240 ml  Output             1441 ml  Net            -1201 ml     Physical Exam: General: No acute distress, laying in bed.  HEENT Anicteric, moist mucous membranes   Neck Supple   Pulm/lungs Lungs are clear to auscultation bilaterally, normal breathing effort   CVS/Heart Regular rhythm, no rub or gallop   Abdomen:  Soft, nontender, bowel sounds present  Extremities: Trace peripheral edema.    Neurologic: Alert, oriented, follows commands  Skin: No acute rashes   Access: PD catheter        Basic Metabolic Panel:   Recent Labs Lab 06/11/16 1611  NA 138  K 3.8  CL 96*  CO2 25  GLUCOSE 180*  BUN 83*  CREATININE 13.20*  CALCIUM 8.7*     CBC:  Recent Labs Lab 06/11/16 1611 06/15/16 1553 06/16/16 0402 06/18/16 0458  WBC 8.8 9.1 9.2 8.6  NEUTROABS 6.2  --   --  5.6  HGB 6.5* 6.8* 8.8* 8.8*  HCT 18.9* 20.2* 25.0* 25.3*  MCV 100.1* 102.5* 96.1 97.2  PLT 201 224 209 203      Microbiology:  Recent Results (from the past 720 hour(s))  Blood culture (routine x 2)     Status: Abnormal   Collection Time: 05/21/16 12:01 PM  Result Value Ref Range Status   Specimen  Description BLOOD RIGHT ANTECUBITAL  Final   Special Requests BOTTLES DRAWN AEROBIC AND ANAEROBIC 4CC  Final   Culture  Setup Time   Final    GRAM POSITIVE COCCI ANAEROBIC BOTTLE ONLY CRITICAL RESULT CALLED TO, READ BACK BY AND VERIFIED WITH: NATE COOKSON 05/22/16 0130 TLB/SGD    Culture (A)  Final    STREPTOCOCCUS AGALACTIAE SUSCEPTIBILITIES PERFORMED ON PREVIOUS CULTURE WITHIN THE LAST 5 DAYS. Performed at [facility]    Report Status 05/24/2016 FINAL  Final  Blood culture (routine x 2)     Status: Abnormal   Collection Time: 05/21/16 12:01 PM  Result Value Ref Range Status   Specimen Description BLOOD LEFT HAND  Final   Special Requests BOTTLES DRAWN AEROBIC AND ANAEROBIC  [place]  Final   Culture  Setup Time   Final    GRAM POSITIVE COCCI ANAEROBIC BOTTLE ONLY CRITICAL RESULT CALLED TO, READ BACK BY AND VERIFIED WITH: NATE COOKSON ON 05/22/16 AT 0130 BY TLB CONFIRMED BY TLB/[facility]    Culture STREPTOCOCCUS AGALACTIAE (A)  Final  Report Status 05/24/2016 FINAL  Final   Organism ID, Bacteria STREPTOCOCCUS AGALACTIAE  Final      Susceptibility   Streptococcus agalactiae - MIC*    CLINDAMYCIN <=0.25 SENSITIVE Sensitive     AMPICILLIN <=0.25 SENSITIVE Sensitive     ERYTHROMYCIN 2 RESISTANT Resistant     VANCOMYCIN 0.5 SENSITIVE Sensitive     CEFTRIAXONE <=0.12 SENSITIVE Sensitive     LEVOFLOXACIN 0.5 SENSITIVE Sensitive     * STREPTOCOCCUS AGALACTIAE  Blood Culture ID Panel (Reflexed)     Status: Abnormal   Collection Time: 05/21/16 12:01 PM  Result Value Ref Range Status   Enterococcus species NOT DETECTED NOT DETECTED Final   Vancomycin resistance NOT DETECTED NOT DETECTED Final   Listeria monocytogenes NOT DETECTED NOT DETECTED Final   Staphylococcus species NOT DETECTED NOT DETECTED Final   Staphylococcus aureus NOT DETECTED NOT DETECTED Final   Methicillin resistance NOT DETECTED NOT DETECTED Final   Streptococcus species DETECTED (A) NOT DETECTED Final    Comment:  CRITICAL RESULT CALLED TO, READ BACK BY AND VERIFIED WITH: NATE COOKSON ON 05/22/16 AT 0130 BY TLB    Streptococcus agalactiae DETECTED (A) NOT DETECTED Final    Comment: CRITICAL RESULT CALLED TO, READ BACK BY AND VERIFIED WITH: NATE COOKSON ON 05/22/16 AT 0130 BY TLB    Streptococcus pneumoniae NOT DETECTED NOT DETECTED Final   Streptococcus pyogenes NOT DETECTED NOT DETECTED Final   Acinetobacter baumannii NOT DETECTED NOT DETECTED Final   Enterobacteriaceae species NOT DETECTED NOT DETECTED Final   Enterobacter cloacae complex NOT DETECTED NOT DETECTED Final   Escherichia coli NOT DETECTED NOT DETECTED Final   Klebsiella oxytoca NOT DETECTED NOT DETECTED Final   Klebsiella pneumoniae NOT DETECTED NOT DETECTED Final   Proteus species NOT DETECTED NOT DETECTED Final   Serratia marcescens NOT DETECTED NOT DETECTED Final   Carbapenem resistance NOT DETECTED NOT DETECTED Final   Haemophilus influenzae NOT DETECTED NOT DETECTED Final   Neisseria meningitidis NOT DETECTED NOT DETECTED Final   Pseudomonas aeruginosa NOT DETECTED NOT DETECTED Final   Candida albicans NOT DETECTED NOT DETECTED Final   Candida glabrata NOT DETECTED NOT DETECTED Final   Candida krusei NOT DETECTED NOT DETECTED Final   Candida parapsilosis NOT DETECTED NOT DETECTED Final   Candida tropicalis NOT DETECTED NOT DETECTED Final  Body fluid culture     Status: None   Collection Time: 05/21/16 10:31 PM  Result Value Ref Range Status   Specimen Description PERITONEAL  Final   Special Requests Normal  Final   Gram Stain NO WBC SEEN NO ORGANISMS SEEN   Final   Culture   Final    NO GROWTH 3 DAYS Performed at [facility]    Report Status 05/25/2016 FINAL  Final  CULTURE, BLOOD (ROUTINE X 2) w Reflex to ID Panel     Status: None   Collection Time: 05/24/16 10:10 AM  Result Value Ref Range Status   Specimen Description BLOOD RIGHT ASSIST CONTROL  Final   Special Requests BOTTLES DRAWN AEROBIC AND ANAEROBIC   8CC  Final   Culture NO GROWTH 5 DAYS  Final   Report Status 05/29/2016 FINAL  Final  CULTURE, BLOOD (ROUTINE X 2) w Reflex to ID Panel     Status: None   Collection Time: 05/24/16 10:10 AM  Result Value Ref Range Status   Specimen Description BLOOD LEFT ASSIST CONTROL  Final   Special Requests BOTTLES DRAWN AEROBIC AND ANAEROBIC  [place]  Final  Culture NO GROWTH 5 DAYS  Final   Report Status 05/29/2016 FINAL  Final  Urine culture     Status: Abnormal   Collection Time: 05/24/16 11:15 AM  Result Value Ref Range Status   Specimen Description URINE, CATHETERIZED  Final   Special Requests Normal  Final   Culture >=100,000 COLONIES/mL YEAST (A)  Final   Report Status 05/25/2016 FINAL  Final    Coagulation Studies:  Recent Labs  06/15/16 1553 06/16/16 0402  LABPROT 15.1 13.9  INR 1.18 1.07    Urinalysis: No results for input(s): COLORURINE, LABSPEC, PHURINE, GLUCOSEU, HGBUR, BILIRUBINUR, KETONESUR, PROTEINUR, UROBILINOGEN, NITRITE, LEUKOCYTESUR in the last 72 hours.  Invalid input(s): APPERANCEUR    Imaging: No results found.   Medications:     . sodium chloride  10 mL/hr Intravenous Once  . allopurinol  100 mg Oral Daily  . amLODipine  10 mg Oral QAC lunch  . aspirin EC  81 mg Oral Daily  . atenolol  25 mg Oral Daily  . cinacalcet  90 mg Oral Q supper  . cloNIDine  0.3 mg Oral BID  . dialysis solution 1.5% low-MG/low-CA   Intraperitoneal Q24H  . dialysis solution 2.5% low-MG/low-CA   Intraperitoneal Q24H  . epoetin (EPOGEN/PROCRIT) injection  20,000 Units Subcutaneous Weekly  . ferrous sulfate  325 mg Oral Q breakfast  . gentamicin cream  1 application Topical Daily  . hydrALAZINE  50 mg Oral TID  . insulin aspart  0-12 Units Subcutaneous TID AC & HS  . insulin glargine  42 Units Subcutaneous QHS  . isosorbide mononitrate  30 mg Oral BID  . latanoprost  1 drop Both Eyes QHS  . losartan  100 mg Oral Daily  . pantoprazole  40 mg Oral Daily  . sevelamer carbonate   2,400 mg Oral TID WC  . sodium chloride flush  3 mL Intravenous Q12H   acetaminophen **OR** acetaminophen, amitriptyline, HYDROcodone-acetaminophen, metoCLOPramide, ondansetron **OR** ondansetron (ZOFRAN) IV  Assessment/ Plan:  75 y.o. African American female end-stage renal disease, peritoneal dialysis, insulin-dependent diabetes, with complications of retinopathy, nephropathy, glaucoma, GERD, history of gout, hypertension, hyperlipidemia  1. End-stage renal disease - Tolerating peritoneal dialysis well. Continue current peritoneal dialysis prescription.   2. Secondary hyperparathyroidism Continue Renvela as well as Sensipar for now.  3. Anemia of chronic kidney disease Hemoglobin was 6.5 at admission Patient received 2 units of blood this admission. Previously, on July 22, she had received another unit of blood -  Appreciate GI and hematology input. Continue to monitor hemoglobin. Most recent hemoglobin was 8.8.       LOS: 1 Laurie Steele 8/14/201711:00 AM

## 2016-06-18 NOTE — Discharge Summary (Signed)
[facility] [place] NAME: Laurie Steele    MR#:  [id number]  DATE OF BIRTH:  1941-10-16  DATE OF ADMISSION:  06/15/2016 ADMITTING PHYSICIAN: Loletha Grayer, MD  DATE OF DISCHARGE: 06/18/2016  PRIMARY CARE PHYSICIAN: [name], MD    ADMISSION DIAGNOSIS:  Symptomatic anemia [D64.9]  DISCHARGE DIAGNOSIS:  Acute on chronic anemia secondary to kidney disease chronic/ end-stage Right elbow arthritis Hypertension 2 diabetes End-stage renal disease on peritoneal dialysis SECONDARY DIAGNOSIS:   Past Medical History:  Diagnosis Date  . Chronic anemia    Dr Inez Pilgrim  . GERD (gastroesophageal reflux disease)   . Glaucoma   . Gout   . Hx of colonic polyps   . Hyperlipidemia   . Hypertension   . Lower back pain   . Miscarriage   . 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 Peritoneal dialysis status ([place])   . Renal failure    Dr Holley Raring  . 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 Vaginal delivery    x 4    HOSPITAL COURSE:  75 y.o.African American femaleend-stage renal disease, peritoneal dialysis, insulin-dependent diabetes, with complications of retinopathy, nephropathy, glaucoma, GERD, history of gout, hypertension, hyperlipidemia  1.Anemia of chronic kidney disease Hemoglobin currently 6.5---8.8 Patient received 2 units of blood overnight. Previously, on July 22, she hadreceived another unit of blood Hemoglobin is low despite getting Procrit GI input noted. No plans for luminal eval - Hemoccult stools done at bedside by me NEGATIVE -heme onc consult appreciated. Patient to follow-up with Dr. Mike Gip as outpatient -Patient was started on oral iron pills. She did get Depo shot in the hospital -No symptoms of active bleeding  2. End-stage renal disease  continued her home peritoneal dialysis    3.HTN Continue home meds  4. Symptoms of chronic dysphagia -Barium swallow done essentially negative -Patient advised to take soft diet  5. Right elbow pain likely degenerative arthritis X-ray negative for fracture or any fluid in the joint -When necessary Norco  We'll resume home health care at home Discharge patient home she is agreeable discharge plan discussed  CONSULTS OBTAINED:  Treatment Team:  San Jetty, MD  DRUG ALLERGIES:  No Known Allergies  DISCHARGE MEDICATIONS:   Current Discharge Medication List    START taking these medications   Details  ferrous sulfate 325 (65 FE) MG tablet Take 1 tablet (325 mg total) by mouth daily with breakfast. Qty: 30 tablet, Refills: 3    pantoprazole (PROTONIX) 40 MG tablet Take 1 tablet (40 mg total) by mouth daily. Qty: 30 tablet, Refills: 1      CONTINUE these medications which have CHANGED   Details  aspirin EC 81 MG tablet Take 1 tablet (81 mg total) by mouth daily. Qty: 30 tablet, Refills: 1      CONTINUE these medications which have NOT CHANGED   Details  allopurinol (ZYLOPRIM) 100 MG tablet Take 1 tablet by mouth daily.     amitriptyline (ELAVIL) 25 MG tablet Take 1 tablet by mouth at bedtime as needed (For foot pain).  Refills: 3    amLODipine (NORVASC) 10 MG tablet Take 10 mg by mouth daily. Take it mid-day Refills: 11    atenolol (TENORMIN) 25 MG tablet Take 1 tablet by mouth daily.    cloNIDine (CATAPRES) 0.3 MG tablet Take 0.3 mg by  mouth 2 (two) times daily.    hydrALAZINE (APRESOLINE) 50 MG tablet Take 50 mg by mouth 3 (three) times daily.      insulin glargine (LANTUS) 100 UNIT/ML injection Inject 0.42 mLs (42 Units total) into the skin at bedtime. Qty: 7 vial, Refills: 3    insulin lispro protamine-lispro (HUMALOG 50/50 MIX) (50-50) 100 UNIT/ML SUSP injection Inject 0-5 Units into the skin at bedtime. Per sliding scale    latanoprost (XALATAN) 0.005 % ophthalmic solution Place 1 drop into  both eyes at bedtime. Refills: 6    lidocaine (XYLOCAINE) 5 % ointment Apply 1 application topically 3 (three) times daily as needed.    losartan (COZAAR) 100 MG tablet Take 100 mg by mouth daily. Refills: 11    metoCLOPramide (REGLAN) 10 MG tablet Take 10 mg by mouth 3 (three) times daily as needed for nausea.     ALPRAZolam (XANAX) 0.25 MG tablet Take 1 tablet by mouth at bedtime as needed for sleep.     B Complex-C-Folic Acid (DIALYVITE Q000111Q PO) Take 1 tablet by mouth daily.    cinacalcet (SENSIPAR) 90 MG tablet Take 90 mg by mouth daily. With lunch    gabapentin (NEURONTIN) 100 MG capsule Take 1 capsule by mouth 3 (three) times daily.    HYDROcodone-acetaminophen (NORCO/VICODIN) 5-325 MG tablet Take 1 tablet by mouth 2 (two) times daily as needed for moderate pain.     insulin aspart (NOVOLOG) 100 UNIT/ML injection Inject 0-5 Units into the skin at bedtime. Qty: 10 mL, Refills: 11    ipratropium (ATROVENT) 0.03 % nasal spray Place 1 spray into both nostrils 3 (three) times daily as needed for rhinitis.    isosorbide mononitrate (IMDUR) 30 MG 24 hr tablet Take 30 mg by mouth 2 (two) times daily.    sevelamer (RENVELA) 800 MG tablet Take 2,400 mg by mouth 3 (three) times daily with meals.         If you experience worsening of your admission symptoms, develop shortness of breath, life threatening emergency, suicidal or homicidal thoughts you must seek medical attention immediately by calling 911 or calling your MD immediately  if symptoms less severe.  You Must read complete instructions/literature along with all the possible adverse reactions/side effects for all the Medicines you take and that have been prescribed to you. Take any new Medicines after you have completely understood and accept all the possible adverse reactions/side effects.   Please note  You were cared for by a hospitalist during your hospital stay. If you have any questions about your discharge medications  or the care you received while you were in the hospital after you are discharged, you can call the unit and asked to speak with the hospitalist on call if the hospitalist that took care of you is not available. Once you are discharged, your primary care physician will handle any further medical issues. Please note that NO REFILLS for any discharge medications will be authorized once you are discharged, as it is imperative that you return to your primary care physician (or establish a relationship with a primary care physician if you do not have one) for your aftercare needs so that they can reassess your need for medications and monitor your lab values. Today   SUBJECTIVE   Right elbow pain  VITAL SIGNS:  Blood pressure (!) 131/53, pulse (!) 57, temperature 98 F (36.7 C), temperature source Oral, resp. rate 18, height 5\' 7"  (1.702 m), weight 235 lb 0.2 oz (106.6 kg), SpO2  96 %.  I/O:   Intake/Output Summary (Last 24 hours) at 06/18/16 1243 Last data filed at 06/18/16 1200  Gross per 24 hour  Intake              360 ml  Output              300 ml  Net               60 ml    PHYSICAL EXAMINATION:  GENERAL:  75 y.o.-year-old patient lying in the bed with no acute distress.  EYES: Pupils equal, round, reactive to light and accommodation. No scleral icterus. Extraocular muscles intact.  HEENT: Head atraumatic, normocephalic. Oropharynx and nasopharynx clear.  NECK:  Supple, no jugular venous distention. No thyroid enlargement, no tenderness.  LUNGS: Normal breath sounds bilaterally, no wheezing, rales,rhonchi or crepitation. No use of accessory muscles of respiration.  CARDIOVASCULAR: S1, S2 normal. No murmurs, rubs, or gallops.  ABDOMEN: Soft, non-tender, non-distended. Bowel sounds present. No organomegaly or mass.  EXTREMITIES: No pedal edema, cyanosis, or clubbing. Mild swelling of right elbow NEUROLOGIC: Cranial nerves II through XII are intact. Muscle strength 5/5 in all extremities.  Sensation intact. Gait not checked.  PSYCHIATRIC: The patient is alert and oriented x 3.  SKIN: No obvious rash, lesion, or ulcer.   DATA REVIEW:   CBC   Recent Labs Lab 06/18/16 0458  WBC 8.6  HGB 8.8*  HCT 25.3*  PLT 203    Chemistries   Recent Labs Lab 06/11/16 1611  NA 138  K 3.8  CL 96*  CO2 25  GLUCOSE 180*  BUN 83*  CREATININE 13.20*  CALCIUM 8.7*  AST 31  ALT 27  ALKPHOS 103  BILITOT 0.4    Microbiology Results   No results found for this or any previous visit (from the past 240 hour(s)).  RADIOLOGY:  Dg Elbow 2 Views Right  Result Date: 06/18/2016 CLINICAL DATA:  Chronic pain and swelling of right elbow. EXAM: RIGHT ELBOW - 2 VIEW COMPARISON:  None. FINDINGS: No acute bony abnormality. Specifically, no fracture, subluxation, or dislocation. Soft tissues are intact. IMPRESSION: Negative. Electronically Signed   By: Rolm Baptise M.D.   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 There is no fixed stricture or hiatal hernia. There was no obstruction to passage of the barium tablet. EXAM: ESOPHOGRAM/BARIUM SWALLOW TECHNIQUE: Single contrast examination was performed using thin barium or water soluble. A barium tablet was administered as well. FLUOROSCOPY TIME:  Fluoroscopy Time:  0 minutes, 36 seconds Number of Acquired Images:  1 video sequence. COMPARISON:  None in PACs FINDINGS: The study was abbreviated due to the patient's inability to stand for significant periods and overall debility. The study was performed with the patient in the semi upright position. The patient ingested the thin barium without difficulty. The cervical esophagus was grossly normal. There was no laryngeal penetration of the barium. A few tertiary contractions were observed but overall esophageal motility was reasonable. No reflux was observed. There was mild narrowing of the distal esophagus but this did allow  passage of the barium tablet. 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 No fixed stricture was observed. There was no evidence of esophagitis. No evidence of aspiration. Electronically Signed   By: David  Martinique M.D.   On: 06/18/2016 12:21     Management plans discussed with the patient, family and they are in agreement.  CODE STATUS:     Code Status Orders        Start     Ordered   06/15/16 1915  Full code  Continuous     06/15/16 1915    Code Status History    Date Active Date Inactive Code Status Order ID Comments User Context   05/21/2016  4:28 PM 05/28/2016  6:11 PM Full Code [id number]  Henreitta Leber, MD Inpatient   04/05/2016  4:14 PM 04/06/2016  9:07 PM Full Code [id number]  Loletha Grayer, MD ED   03/14/2016 11:50 PM 03/16/2016  3:34 PM Full Code [id number]  Max Sane, MD Inpatient      TOTAL TIME TAKING CARE OF THIS PATIENT: 40 minutes.    Anupama Piehl M.D on 06/18/2016 at 12:43 PM  Between 7am to 6pm - Pager - [phone] After 6pm go to [email or web address] - password [email or web address] [facility]  Office  [phone]  CC: Primary care physician; [name], MD

## 2016-06-18 NOTE — Progress Notes (Signed)
[facility]  Patient Status: Active  [facility] is providing the following services: SN/PT/HHA  If patient discharges after hours, please call [phone].   Laurie Steele 06/18/2016, 9:17 AM

## 2016-06-19 LAB — MULTIPLE MYELOMA PANEL, SERUM
Albumin SerPl Elph-Mcnc: 2.5 g/dL — ABNORMAL LOW (ref 2.9–4.4)
Albumin/Glob SerPl: 1 (ref 0.7–1.7)
Alpha 1: 0.2 g/dL (ref 0.0–0.4)
Alpha2 Glob SerPl Elph-Mcnc: 1 g/dL (ref 0.4–1.0)
B-Globulin SerPl Elph-Mcnc: 0.7 g/dL (ref 0.7–1.3)
Gamma Glob SerPl Elph-Mcnc: 0.7 g/dL (ref 0.4–1.8)
Globulin, Total: 2.6 g/dL (ref 2.2–3.9)
IgA: 191 mg/dL (ref 64–422)
IgG (Immunoglobin G), Serum: 817 mg/dL (ref 700–1600)
IgM, Serum: 27 mg/dL (ref 26–217)
Total Protein ELP: 5.1 g/dL — ABNORMAL LOW (ref 6.0–8.5)

## 2016-06-19 LAB — ANA W/REFLEX: Anti Nuclear Antibody(ANA): POSITIVE — AB

## 2016-06-19 LAB — ENA+DNA/DS+SJORGEN'S
ENA SM Ab Ser-aCnc: <0.2 AI (ref 0.0–0.9)
Ribonucleic Protein: 0.3 AI (ref 0.0–0.9)
SSA (Ro) (ENA) Antibody, IgG: <0.2 AI (ref 0.0–0.9)
SSB (La) (ENA) Antibody, IgG: <0.2 AI (ref 0.0–0.9)
ds DNA Ab: 177 IU/mL — ABNORMAL HIGH (ref 0–9)

## 2016-06-20 NOTE — Progress Notes (Signed)
[facility] Hematology/Oncology Progress Note  Date of admission: 06/15/2016  Hospital day:  06/18/2016  Chief Complaint: Laurie Steele is a 75 y.o. female with ESRD on peritoneal dialysis who was admitted through the emergency room with symptomatic anemia.  Subjective:  Patient denies any complaint.  She is being discharged this afternoon.  Social History: The patient is alone today.  Allergies: No Known Allergies  Review of Systems: GENERAL: Feels "ok".  No fevers, sweats or weight loss. PERFORMANCE STATUS (ECOG):  1-2 HEENT:  No visual changes, runny nose, sore throat, mouth sores or tenderness. Lungs: No shortness of breath.  No cough.  No hemoptysis. Cardiac:  No chest pain, palpitations, orthopnea, or PND. GI:  No nausea, vomiting, diarrhea, constipation, melena or hematochezia. GU:  Peritoneal dialysis.  Makes some urine.  No urgency, frequency, dysuria, or hematuria. Musculoskeletal:  No back pain.  No joint pain.  No muscle tenderness. Extremities:  No pain or swelling. Skin:  No rashes or skin changes. Neuro:  No headache, numbness or weakness, balance or coordination issues. Endocrine:  Diabetes.  No thyroid issues, hot flashes or night sweats. Psych:  No mood changes, depression or anxiety. Pain:  No focal pain. Review of systems:  All other systems   Physical Exam: Blood pressure (!) 122/50, pulse (!) 54, temperature 98.2 F (36.8 C), temperature source Oral, resp. rate 17, height 5\' 7"  (1.702 m), weight 235 lb 0.2 oz (106.6 kg), SpO2 96 %.  GENERAL:  Well developed, well nourished, overweight woman sitting comfortably on the medical unit ready to eat lunch (fried chicken).  MENTAL STATUS:  Alert and oriented to person, place and time. HEAD:  Short gray hair.  Normocephalic, atraumatic, face symmetric, no Cushingoid features. EYES:  Glasses.  Brown eyes.  Pupils equal round and reactive to light and accomodation.  No conjunctivitis or scleral  icterus. ENT:  Oropharynx clear without lesion.  Tongue normal. Mucous membranes moist.  RESPIRATORY:  Clear to auscultation without rales, wheezes or rhonchi. CARDIOVASCULAR:  Regular rate and rhythm without murmur, rub or gallop. ABDOMEN:  Peritoneal dialysis catheter.  Soft, non-tender, with active bowel sounds, and no appreciable hepatosplenomegaly.  No masses. SKIN:  No rashes, ulcers or lesions. EXTREMITIES: No edema, no skin discoloration or tenderness.  No palpable cords.  NEUROLOGICAL: Unremarkable. PSYCH:  Appropriate.   Results for orders placed or performed during the hospital encounter of 06/15/16 (from the past 48 hour(s))  Glucose, capillary     Status: Abnormal   Collection Time: 06/18/16  7:28 AM  Result Value Ref Range   Glucose-Capillary 138 (H) 65 - 99 mg/dL   Comment 1 Notify RN   Glucose, capillary     Status: Abnormal   Collection Time: 06/18/16 11:30 AM  Result Value Ref Range   Glucose-Capillary 104 (H) 65 - 99 mg/dL   Comment 1 Notify RN    Dg Elbow 2 Views Right  Result Date: 06/18/2016 CLINICAL DATA:  Chronic pain and swelling of right elbow. EXAM: RIGHT ELBOW - 2 VIEW COMPARISON:  None. FINDINGS: No acute bony abnormality. Specifically, no fracture, subluxation, or dislocation. Soft tissues are intact. IMPRESSION: Negative. Electronically Signed   By: Rolm Baptise M.D.   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 There is no fixed stricture or hiatal hernia. There was no obstruction to passage of the barium tablet. EXAM: ESOPHOGRAM/BARIUM SWALLOW TECHNIQUE: Single contrast examination was performed using  thin barium or water soluble. A barium tablet was administered as well. FLUOROSCOPY TIME:  Fluoroscopy Time:  0 minutes, 36 seconds Number of Acquired Images:  1 video sequence. COMPARISON:  None in PACs FINDINGS: The study was abbreviated due to the patient's inability to stand  for significant periods and overall debility. The study was performed with the patient in the semi upright position. The patient ingested the thin barium without difficulty. The cervical esophagus was grossly normal. There was no laryngeal penetration of the barium. A few tertiary contractions were observed but overall esophageal motility was reasonable. No reflux was observed. There was mild narrowing of the distal esophagus but this did allow passage of the barium tablet. 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 No fixed stricture was observed. There was no evidence of esophagitis. No evidence of aspiration. Electronically Signed   By: David  Martinique M.D.   On: 06/18/2016 12:21    Assessment:  Laurie Steele is a 75 y.o. female with ESRD on peritoneal dialysis x 8 years who was admitted with symptomatic anemia.  She has had progressive anemia since 04/2016 requiring transfusion x 2 (05/23/2016 and 06/15/2016).    Work-up reveals the following: ferritin (895), iron studies (elevated post transfusion), B12 (683), folate (28.0), free T4 (0.89), and [place].  Retic 2.1% (inappropriately low).  Pending are ANA and SPEP.  She receives Procrit monthly.  Diet is good.  She denies any hematuria, vaginal bleeding, melena or hematochezia.  EGD and colonoscopy in 2013 and a capsule study in 2014 were negative.  Stool hemoccult was negative on 05/24/2016 and 06/17/2016.   Plan:   1.  Hematology:  Records from Dr. Inez Pilgrim reviewed.  Patient last seen 02/20/2010.  Work-up included a bone marrow aspirate (unable to obtain report), but notes indicate no abnormality.  Working diagnosis at that time was anemia due to renal insufficiency.  CBC on 02/20/2010 included a hematocrit of 29.9 and hemoglobin 10.3.  Procit was being discussd at that time, but had not started.  Follow-up SPEP and ANA in outpatient department.  If etiology remains unclear, will discuss repeat bone marrow aspirate and  biopsy.  Patient will be contacted regarding outpatient follow-up.   Lequita Asal, MD  06/18/2016

## 2016-07-02 ENCOUNTER — Inpatient Hospital Stay (HOSPITAL_BASED_OUTPATIENT_CLINIC_OR_DEPARTMENT_OTHER): Payer: Medicare Other | Admitting: Hematology and Oncology

## 2016-07-02 ENCOUNTER — Encounter: Payer: Self-pay | Admitting: Hematology and Oncology

## 2016-07-02 ENCOUNTER — Inpatient Hospital Stay: Payer: Medicare Other | Attending: Hematology and Oncology

## 2016-07-02 VITALS — BP 103/65 | HR 60 | Temp 96.0°F | Resp 18 | Wt 220.0 lb

## 2016-07-02 DIAGNOSIS — Z803 Family history of malignant neoplasm of breast: Secondary | ICD-10-CM | POA: Diagnosis not present

## 2016-07-02 DIAGNOSIS — K219 Gastro-esophageal reflux disease without esophagitis: Secondary | ICD-10-CM | POA: Diagnosis not present

## 2016-07-02 DIAGNOSIS — E1122 Type 2 diabetes mellitus with diabetic chronic kidney disease: Secondary | ICD-10-CM

## 2016-07-02 DIAGNOSIS — Z79899 Other long term (current) drug therapy: Secondary | ICD-10-CM | POA: Diagnosis not present

## 2016-07-02 DIAGNOSIS — R768 Other specified abnormal immunological findings in serum: Secondary | ICD-10-CM | POA: Diagnosis not present

## 2016-07-02 DIAGNOSIS — I120 Hypertensive chronic kidney disease with stage 5 chronic kidney disease or end stage renal disease: Secondary | ICD-10-CM | POA: Diagnosis not present

## 2016-07-02 DIAGNOSIS — Z794 Long term (current) use of insulin: Secondary | ICD-10-CM | POA: Diagnosis not present

## 2016-07-02 DIAGNOSIS — Z7982 Long term (current) use of aspirin: Secondary | ICD-10-CM | POA: Diagnosis not present

## 2016-07-02 DIAGNOSIS — R5383 Other fatigue: Secondary | ICD-10-CM

## 2016-07-02 DIAGNOSIS — R531 Weakness: Secondary | ICD-10-CM | POA: Insufficient documentation

## 2016-07-02 DIAGNOSIS — Z992 Dependence on renal dialysis: Secondary | ICD-10-CM | POA: Diagnosis not present

## 2016-07-02 DIAGNOSIS — E11319 Type 2 diabetes mellitus with unspecified diabetic retinopathy without macular edema: Secondary | ICD-10-CM | POA: Diagnosis not present

## 2016-07-02 DIAGNOSIS — D649 Anemia, unspecified: Secondary | ICD-10-CM

## 2016-07-02 DIAGNOSIS — Z87891 Personal history of nicotine dependence: Secondary | ICD-10-CM | POA: Diagnosis not present

## 2016-07-02 DIAGNOSIS — N186 End stage renal disease: Secondary | ICD-10-CM | POA: Diagnosis not present

## 2016-07-02 DIAGNOSIS — E785 Hyperlipidemia, unspecified: Secondary | ICD-10-CM | POA: Diagnosis not present

## 2016-07-02 LAB — SAMPLE TO BLOOD BANK

## 2016-07-02 LAB — CBC WITH DIFFERENTIAL/PLATELET
Basophils Absolute: 0.1 10*3/uL (ref 0–0.1)
Basophils Relative: 0 %
Eosinophils Absolute: 0.4 10*3/uL (ref 0–0.7)
Eosinophils Relative: 3 %
HCT: 27.2 % — ABNORMAL LOW (ref 35.0–47.0)
Hemoglobin: 9 g/dL — ABNORMAL LOW (ref 12.0–16.0)
Lymphocytes Relative: 18 %
Lymphs Abs: 2.2 10*3/uL (ref 1.0–3.6)
MCH: 33.1 pg (ref 26.0–34.0)
MCHC: 33.1 g/dL (ref 32.0–36.0)
MCV: 100 fL (ref 80.0–100.0)
Monocytes Absolute: 1.1 10*3/uL — ABNORMAL HIGH (ref 0.2–0.9)
Monocytes Relative: 9 %
Neutro Abs: 8.3 10*3/uL — ABNORMAL HIGH (ref 1.4–6.5)
Neutrophils Relative %: 70 %
Platelets: 233 10*3/uL (ref 150–440)
RBC: 2.71 MIL/uL — ABNORMAL LOW (ref 3.80–5.20)
RDW: 20.8 % — ABNORMAL HIGH (ref 11.5–14.5)
WBC: 12 10*3/uL — ABNORMAL HIGH (ref 3.6–11.0)

## 2016-07-02 NOTE — Progress Notes (Signed)
Patient is here for follow up, she is feeling very tired and weak today

## 2016-07-02 NOTE — Progress Notes (Signed)
[facility] day:  07/02/2016  Chief Complaint: Laurie Steele is a 75 y.o. female with ESRD on peritoneal dialysis and anemia who is seen for assessment after recent hospitalization.  HPI:   The patient was admitted to [facility] from 06/15/2016 - 06/18/2016 with symptomatic anemia.  Hematocrit was 20.2 and hemoglobin 6.8.  She received 2 units of PRBCs.  Stool was guaiac negative.  She was seen by Dr. San Jetty of gastroenterology.  Recommendation was to discontinue all NSAIDs and aspirin.  She notes a history of anemia for about 20 years.  She previously saw Dr. Inez Pilgrim.  She underwent bone marrow aspirate and biopsy approximately 15 years ago (no report available).  She was treated with a shot (? Procrit).  She has a history of ESRD on peritoneal dialysis for 8 years.  She has chronic anemia.   Anemia has slowly progressed since the middle of 2017.  Labs in 12/2015 - 04/2016 revealed a hematocrit 28.2 - 34.8 (hemoglobin 9.4 - 11.4).  On 05/23/2016, hematocrit was 17.9 with a hemoglobin of 6.1.  She received 1 unit of PRBCs.  CBC on 06/11/2016 revealed a hematocrit of 18.9 with a hemoglobin of 6.5.  MCV was 100.1.  CBC on 06/15/2016 revealed a hematocrit of 20.2, hemoglobin 6.8 and MCV 102.5.  She received 2 units of PRBCs.  Labs on 06/16/2016 revealed a ferritin of 895 (11-307), iron saturation 88% (high), and TIBC 167 (low).  Her diet is good.  She eats grilled chicken and tuna.  She avoids lean meat.  She denies pica.  She denies any melena or hematochezia.  She denies any hematuria or vaginal bleeding.  She previously saw Dr. Candace Cruise.  His notes indicate an EGD and colonoscopy in 2013 and a capsule study in 2014 were negative.  Stool hemoccult was negative on 05/24/2016 and 06/17/2016. The patient has seen by Dr. San Jetty, gastroenterologist.  Plan was for EGD/colonoscopy if stool guaiac was positive.  She is followed by Dr. Holley Raring in nephrology.   She receives Procrit once a month through [place].   Symptomatically, she feels tired.  She denies any fever.  Her performance status is poor.  She is only able to feed herself.    Past Medical History:  Diagnosis Date  . Chronic anemia    Dr Inez Pilgrim  . GERD (gastroesophageal reflux disease)   . Glaucoma   . Gout   . Hx of colonic polyps   . Hyperlipidemia   . Hypertension   . Lower back pain   . Miscarriage   . 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 Peritoneal dialysis status ([place])   . Renal failure    Dr Holley Raring  . 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 Vaginal delivery    x 4    Past Surgical History:  Procedure Laterality Date  . ABDOMINAL HYSTERECTOMY  1983  . BACK SURGERY  6/08   Dr Collier Salina  . BACK SURGERY  1987   disc  . CHOLECYSTECTOMY    . PERITONEAL CATHETER INSERTION      Family History  Problem Relation Age of Onset  . Heart attack Father   . Hypertension Mother   . Hyperlipidemia Mother   . Coronary artery disease      strong fam hx  . Breast cancer Neg Hx  Social History:  reports that she quit smoking about 25 years ago. She has never used smokeless tobacco. She reports that she does not drink alcohol or use drugs.  Contact number is [phone].  The patient is accompanied by her son, [name], today.  Allergies: No Known Allergies  Current Medications: Current Outpatient Prescriptions  Medication Sig Dispense Refill  . ACCU-CHEK SOFTCLIX LANCETS lancets 3 (three) times daily.    Marland Kitchen allopurinol (ZYLOPRIM) 100 MG tablet Take 1 tablet by mouth daily.     Marland Kitchen ALPRAZolam (XANAX) 0.25 MG tablet Take 1 tablet by mouth at bedtime as needed for sleep.     Marland Kitchen amitriptyline (ELAVIL) 25 MG tablet Take 1 tablet by mouth at bedtime as needed (For foot pain).   3  . amLODipine (NORVASC) 10 MG tablet Take 10 mg by mouth daily. Take it  mid-day  11  . aspirin (GOODSENSE ASPIRIN) 325 MG tablet TAKE 1 TABLET BY MOUTH EACH DAY    . aspirin EC 81 MG tablet Take 1 tablet (81 mg total) by mouth daily. (Patient not taking: Reported on 07/02/2016) 30 tablet 1  . atenolol (TENORMIN) 25 MG tablet Take 1 tablet by mouth daily.    . B Complex-C-Folic Acid (DIALYVITE [id number] PO) Take 1 tablet by mouth daily.    . cinacalcet (SENSIPAR) 90 MG tablet Take 90 mg by mouth daily. With lunch    . cloNIDine (CATAPRES) 0.3 MG tablet Take 0.3 mg by mouth 2 (two) times daily.    . colchicine 0.6 MG tablet TAKE ONE TABLET BY MOUTH TWICE DAILY FOR SEVEN DAYS    . cyclobenzaprine (FLEXERIL) 5 MG tablet Take by mouth.    . diclofenac sodium (VOLTAREN) 1 % GEL Apply 2 g four times a day PRN Not a candidate for oral NSAIDS, ESRD on PD    . ferrous sulfate 325 (65 FE) MG tablet Take 1 tablet (325 mg total) by mouth daily with breakfast. (Patient not taking: Reported on 07/02/2016) 30 tablet 3  . fluticasone (FLONASE) 50 MCG/ACT nasal spray Place into the nose.    . gabapentin (NEURONTIN) 100 MG capsule Take 1 capsule by mouth 3 (three) times daily.    . hydrALAZINE (APRESOLINE) 50 MG tablet Take 50 mg by mouth 3 (three) times daily.      Marland Kitchen HYDROcodone-acetaminophen (NORCO/VICODIN) 5-325 MG tablet Take 1 tablet by mouth 2 (two) times daily as needed for moderate pain.     Marland Kitchen insulin aspart (NOVOLOG) 100 UNIT/ML injection Inject 0-5 Units into the skin at bedtime. (Patient not taking: Reported on 06/15/2016) 10 mL 11  . insulin glargine (LANTUS) 100 UNIT/ML injection Inject 0.42 mLs (42 Units total) into the skin at bedtime. (Patient not taking: Reported on 07/02/2016) 7 vial 3  . insulin lispro protamine-lispro (HUMALOG 50/50 MIX) (50-50) 100 UNIT/ML SUSP injection Inject 0-5 Units into the skin at bedtime. Per sliding scale    . ipratropium (ATROVENT) 0.03 % nasal spray Place 1 spray into both nostrils 3 (three) times daily as needed for rhinitis.    Marland Kitchen isosorbide  mononitrate (IMDUR) 30 MG 24 hr tablet Take 30 mg by mouth 2 (two) times daily.    Marland Kitchen lactulose (CHRONULAC) 10 GM/15ML solution Take 15 ml to 30 ml daily as needed for constipation.    Marland Kitchen latanoprost (XALATAN) 0.005 % ophthalmic solution Place 1 drop into both eyes at bedtime.  6  . lidocaine (XYLOCAINE) 5 % ointment Apply 1 application topically 3 (three) times daily as needed.    Marland Kitchen  Lidocaine, Anorectal, 5 % GEL Apply 1 Application topically 3 (three) times daily.    Marland Kitchen losartan (COZAAR) 100 MG tablet Take 100 mg by mouth daily.  11  . metoCLOPramide (REGLAN) 10 MG tablet Take 10 mg by mouth 3 (three) times daily as needed for nausea.     . ondansetron (ZOFRAN) 8 MG tablet Take by mouth.    . pantoprazole (PROTONIX) 40 MG tablet Take 1 tablet (40 mg total) by mouth daily. (Patient not taking: Reported on 07/02/2016) 30 tablet 1  . pravastatin (PRAVACHOL) 80 MG tablet Take by mouth.    . senna-docusate (SENOKOT-S) 8.6-50 MG tablet Take by mouth.    . sevelamer (RENVELA) 800 MG tablet Take 2,400 mg by mouth 3 (three) times daily with meals.      No current facility-administered medications for this visit.     Review of Systems:  GENERAL: Fatigue.  Weakness.  No fevers, sweats or weight loss. PERFORMANCE STATUS (ECOG):  2-3 HEENT:  No visual changes, runny nose, sore throat, mouth sores or tenderness. Lungs: Shortness of breath with exertion.  No cough.  No hemoptysis. Cardiac:  No chest pain, palpitations, orthopnea, or PND. GI:  Appetite is 75%.  No nausea, vomiting, diarrhea, constipation, melena or hematochezia. GU:  Peritoneal dialysis.  Makes some urine.  No urgency, frequency, dysuria, or hematuria. Musculoskeletal:  No back pain.  No joint pain.  No muscle tenderness. Extremities:  No pain or swelling. Skin:  No rashes or skin changes. Neuro:  No headache, numbness or weakness, balance or coordination issues. Endocrine:  Diabetes.  No thyroid issues, hot flashes or night sweats. Psych:   No mood changes, depression or anxiety. Pain:  No focal pain. Review of systems:  All other systems reviewed and found to be negative.  Physical Exam: Blood pressure 103/65, pulse 60, temperature (!) 96 F (35.6 C), temperature source Tympanic, resp. rate 18, weight 220 lb (99.8 kg). GENERAL:  Well developed, well nourished, overweight woman sitting comfortably in the exam room in no acute distress. MENTAL STATUS:  Alert and oriented to person, place and time. HEAD:  Wearing a white cap.  Short gray hair.  Normocephalic, atraumatic, face symmetric, no Cushingoid features. EYES:  Glasses.  Brown eyes.  Pupils equal round and reactive to light and accomodation.  No conjunctivitis or scleral icterus. ENT:  Oropharynx clear without lesion.  Tongue normal. Mucous membranes moist.  RESPIRATORY:  Clear to auscultation without rales, wheezes or rhonchi. CARDIOVASCULAR:  Regular rate and rhythm without murmur, rub or gallop. ABDOMEN:  Peritoneal dialysis catheter.  Soft, non-tender, with active bowel sounds, and no appreciable hepatosplenomegaly.  No masses. SKIN:  No rashes, ulcers or lesions. EXTREMITIES:  Chronic lower extremity edema.  No skin discoloration or tenderness.  No palpable cords. LYMPH NODES: No palpable cervical, supraclavicular, axillary or inguinal adenopathy  NEUROLOGICAL: Unremarkable. PSYCH:  Appropriate.   Appointment on 07/02/2016  Component Date Value Ref Range Status  . WBC 07/02/2016 12.0* 3.6 - 11.0 K/uL Final  . RBC 07/02/2016 2.71* 3.80 - 5.20 MIL/uL Final  . Hemoglobin 07/02/2016 9.0* 12.0 - 16.0 g/dL Final  . HCT 07/02/2016 27.2* 35.0 - 47.0 % Final  . MCV 07/02/2016 100.0  80.0 - 100.0 fL Final  . MCH 07/02/2016 33.1  26.0 - 34.0 pg Final  . MCHC 07/02/2016 33.1  32.0 - 36.0 g/dL Final  . RDW 07/02/2016 20.8* 11.5 - 14.5 % Final  . Platelets 07/02/2016 233  150 - 440 K/uL Final  .  Neutrophils Relative % 07/02/2016 70  % Final  . Neutro Abs 07/02/2016 8.3*  1.4 - 6.5 K/uL Final  . Lymphocytes Relative 07/02/2016 18  % Final  . Lymphs Abs 07/02/2016 2.2  1.0 - 3.6 K/uL Final  . Monocytes Relative 07/02/2016 9  % Final  . Monocytes Absolute 07/02/2016 1.1* 0.2 - 0.9 K/uL Final  . Eosinophils Relative 07/02/2016 3  % Final  . Eosinophils Absolute 07/02/2016 0.4  0 - 0.7 K/uL Final  . Basophils Relative 07/02/2016 0  % Final  . Basophils Absolute 07/02/2016 0.1  0 - 0.1 K/uL Final    Assessment:  SHLEY PICKLE is a 75 y.o. female  with ESRD on peritoneal dialysis x 8 years who was admitted with symptomatic anemia. She has had progressive anemia since 04/2016 requiring transfusion x 2 (05/23/2016 and 06/15/2016).   Work-up revealed the following: ferritin (895), iron studies (elevated post transfusion), B12 (683), folate (28.0), free T4 (0.89), and [place].  Retic 2.1% (inappropriately low).  ANA was positive with a DS-DNA of 177 (0-9).  SPEP revealed no monoclonal proteins.  Immunoglobulins were normal.  She receives Procrit monthly. Diet is good. She denies any hematuria, vaginal bleeding, melena or hematochezia. EGD and colonoscopy in 2013 and a capsule study in 2014 were negative. Stool hemoccult was negative on 05/24/2016 and 06/17/2016.   Symptomatically, she is fatigued.  Hemoglobin has improved from 8.4 to 9.3 without transfusion.  Plan: 1.  Review events of hospitalization.  Review work-up.  Discuss inappropriately low reticulocyte count (ineffective bone marrow).  Iron and vitamin levels good.  Hemoglobin improved today.  Discuss elevated DS-DNA.  Significance unknown. Discuss referral to rheumatology. 2.  Obtain information about Procrit dosing from [place]. 3.  Follow-up with rheumatology regarding high titer + DS DNA.  Patient previously seen by Dr. Marlowe Sax. 4.  RTC in 1 month for MD assessment and labs (CBC with diff, retic)   Lequita Asal, MD  07/02/2016, 3:12 PM

## 2016-07-03 ENCOUNTER — Inpatient Hospital Stay: Payer: Medicare Other

## 2016-07-03 ENCOUNTER — Inpatient Hospital Stay
Admission: EM | Admit: 2016-07-03 | Discharge: 2016-07-05 | DRG: 193 | Disposition: A | Discharge status: Home Health | Payer: Medicare Other | Attending: Internal Medicine | Admitting: Internal Medicine

## 2016-07-03 ENCOUNTER — Encounter: Payer: Self-pay | Admitting: Intensive Care

## 2016-07-03 ENCOUNTER — Emergency Department: Payer: Medicare Other

## 2016-07-03 DIAGNOSIS — H409 Unspecified glaucoma: Secondary | ICD-10-CM | POA: Diagnosis present

## 2016-07-03 DIAGNOSIS — Z9889 Other specified postprocedural states: Secondary | ICD-10-CM

## 2016-07-03 DIAGNOSIS — E1122 Type 2 diabetes mellitus with diabetic chronic kidney disease: Secondary | ICD-10-CM | POA: Diagnosis not present

## 2016-07-03 DIAGNOSIS — Z992 Dependence on renal dialysis: Secondary | ICD-10-CM

## 2016-07-03 DIAGNOSIS — N2581 Secondary hyperparathyroidism of renal origin: Secondary | ICD-10-CM | POA: Diagnosis present

## 2016-07-03 DIAGNOSIS — Z79891 Long term (current) use of opiate analgesic: Secondary | ICD-10-CM

## 2016-07-03 DIAGNOSIS — I120 Hypertensive chronic kidney disease with stage 5 chronic kidney disease or end stage renal disease: Secondary | ICD-10-CM | POA: Diagnosis not present

## 2016-07-03 DIAGNOSIS — K219 Gastro-esophageal reflux disease without esophagitis: Secondary | ICD-10-CM | POA: Diagnosis not present

## 2016-07-03 DIAGNOSIS — D631 Anemia in chronic kidney disease: Secondary | ICD-10-CM | POA: Diagnosis not present

## 2016-07-03 DIAGNOSIS — Z794 Long term (current) use of insulin: Secondary | ICD-10-CM

## 2016-07-03 DIAGNOSIS — E1140 Type 2 diabetes mellitus with diabetic neuropathy, unspecified: Secondary | ICD-10-CM | POA: Diagnosis present

## 2016-07-03 DIAGNOSIS — Z7982 Long term (current) use of aspirin: Secondary | ICD-10-CM | POA: Diagnosis not present

## 2016-07-03 DIAGNOSIS — Z79899 Other long term (current) drug therapy: Secondary | ICD-10-CM

## 2016-07-03 DIAGNOSIS — Z90710 Acquired absence of both cervix and uterus: Secondary | ICD-10-CM

## 2016-07-03 DIAGNOSIS — E11319 Type 2 diabetes mellitus with unspecified diabetic retinopathy without macular edema: Secondary | ICD-10-CM | POA: Diagnosis not present

## 2016-07-03 DIAGNOSIS — N186 End stage renal disease: Secondary | ICD-10-CM | POA: Diagnosis present

## 2016-07-03 DIAGNOSIS — Z7951 Long term (current) use of inhaled steroids: Secondary | ICD-10-CM | POA: Diagnosis not present

## 2016-07-03 DIAGNOSIS — Z87891 Personal history of nicotine dependence: Secondary | ICD-10-CM

## 2016-07-03 DIAGNOSIS — R531 Weakness: Secondary | ICD-10-CM | POA: Diagnosis present

## 2016-07-03 DIAGNOSIS — Z86010 Personal history of colonic polyps: Secondary | ICD-10-CM

## 2016-07-03 DIAGNOSIS — M109 Gout, unspecified: Secondary | ICD-10-CM | POA: Diagnosis present

## 2016-07-03 DIAGNOSIS — J189 Pneumonia, unspecified organism: Secondary | ICD-10-CM | POA: Diagnosis not present

## 2016-07-03 DIAGNOSIS — Z9049 Acquired absence of other specified parts of digestive tract: Secondary | ICD-10-CM

## 2016-07-03 DIAGNOSIS — Z8249 Family history of ischemic heart disease and other diseases of the circulatory system: Secondary | ICD-10-CM

## 2016-07-03 LAB — BASIC METABOLIC PANEL
ANION GAP: 16 — AB (ref 5–15)
BUN: 66 mg/dL — ABNORMAL HIGH (ref 6–20)
CALCIUM: 7.2 mg/dL — AB (ref 8.9–10.3)
CHLORIDE: 97 mmol/L — AB (ref 101–111)
CO2: 23 mmol/L (ref 22–32)
Creatinine, Ser: 13.35 mg/dL — ABNORMAL HIGH (ref 0.44–1.00)
GFR calc Af Amer: 3 mL/min — ABNORMAL LOW (ref >60)
GFR calc non Af Amer: 2 mL/min — ABNORMAL LOW (ref >60)
GLUCOSE: 222 mg/dL — AB (ref 65–99)
Potassium: 3.4 mmol/L — ABNORMAL LOW (ref 3.5–5.1)
Sodium: 136 mmol/L (ref 135–145)

## 2016-07-03 LAB — BLOOD GAS, VENOUS
ACID-BASE EXCESS: 1.8 mmol/L (ref 0.0–2.0)
BICARBONATE: 26.6 mmol/L (ref 20.0–28.0)
O2 SAT: 76.8 %
PATIENT TEMPERATURE: 37
pCO2, Ven: 42 mmHg — ABNORMAL LOW (ref 44.0–60.0)
pH, Ven: 7.41 (ref 7.250–7.430)
pO2, Ven: 41 mmHg (ref 32.0–45.0)

## 2016-07-03 LAB — CBC
HEMATOCRIT: 25.1 % — AB (ref 35.0–47.0)
HEMOGLOBIN: 8.4 g/dL — AB (ref 12.0–16.0)
MCH: 33.5 pg (ref 26.0–34.0)
MCHC: 33.3 g/dL (ref 32.0–36.0)
MCV: 100.4 fL — AB (ref 80.0–100.0)
Platelets: 197 10*3/uL (ref 150–440)
RBC: 2.5 MIL/uL — ABNORMAL LOW (ref 3.80–5.20)
RDW: 20.9 % — ABNORMAL HIGH (ref 11.5–14.5)
WBC: 12.3 10*3/uL — ABNORMAL HIGH (ref 3.6–11.0)

## 2016-07-03 LAB — HEPATIC FUNCTION PANEL
ALK PHOS: 70 U/L (ref 38–126)
ALT: 22 U/L (ref 14–54)
AST: 25 U/L (ref 15–41)
Albumin: 2.2 g/dL — ABNORMAL LOW (ref 3.5–5.0)
BILIRUBIN DIRECT: 0.1 mg/dL (ref 0.1–0.5)
BILIRUBIN INDIRECT: 0.4 mg/dL (ref 0.3–0.9)
BILIRUBIN TOTAL: 0.5 mg/dL (ref 0.3–1.2)
Total Protein: 5.9 g/dL — ABNORMAL LOW (ref 6.5–8.1)

## 2016-07-03 LAB — MAGNESIUM: MAGNESIUM: 1.7 mg/dL (ref 1.7–2.4)

## 2016-07-03 LAB — LACTIC ACID, PLASMA
Lactic Acid, Venous: 1.3 mmol/L (ref 0.5–1.9)
Lactic Acid, Venous: 2 mmol/L (ref 0.5–1.9)

## 2016-07-03 LAB — GLUCOSE, CAPILLARY: Glucose-Capillary: 313 mg/dL — ABNORMAL HIGH (ref 65–99)

## 2016-07-03 MED ORDER — INSULIN ASPART 100 UNIT/ML ~~LOC~~ SOLN
0.0000 [IU] | Freq: Three times a day (TID) | SUBCUTANEOUS | Status: DC
  Administered 2016-07-04: 3 [IU] via SUBCUTANEOUS
  Administered 2016-07-04: 1 [IU] via SUBCUTANEOUS
  Administered 2016-07-04: 7 [IU] via SUBCUTANEOUS
  Administered 2016-07-05: 3 [IU] via SUBCUTANEOUS
  Administered 2016-07-05: 2 [IU] via SUBCUTANEOUS
  Filled 2016-07-03: qty 3
  Filled 2016-07-03: qty 1
  Filled 2016-07-03: qty 7
  Filled 2016-07-03: qty 3
  Filled 2016-07-03: qty 2

## 2016-07-03 MED ORDER — HEPARIN 1000 UNIT/ML FOR PERITONEAL DIALYSIS
500.0000 [IU] | INTRAMUSCULAR | Status: DC | PRN
  Filled 2016-07-03: qty 0.5

## 2016-07-03 MED ORDER — FUROSEMIDE 40 MG PO TABS
40.0000 mg | ORAL_TABLET | Freq: Every day | ORAL | Status: DC
  Administered 2016-07-04 – 2016-07-05 (×2): 40 mg via ORAL
  Filled 2016-07-03 (×2): qty 1

## 2016-07-03 MED ORDER — CYCLOBENZAPRINE HCL 10 MG PO TABS: 5.0000 mg | ORAL_TABLET | Freq: Three times a day (TID) | ORAL | Status: DC | PRN

## 2016-07-03 MED ORDER — ASPIRIN 325 MG PO TABS
325.0000 mg | ORAL_TABLET | Freq: Every day | ORAL | Status: DC
  Administered 2016-07-04 – 2016-07-05 (×2): 325 mg via ORAL
  Filled 2016-07-03 (×2): qty 1

## 2016-07-03 MED ORDER — HEPARIN SODIUM (PORCINE) 5000 UNIT/ML IJ SOLN
5000.0000 [IU] | Freq: Three times a day (TID) | INTRAMUSCULAR | Status: DC
  Administered 2016-07-03 – 2016-07-05 (×5): 5000 [IU] via SUBCUTANEOUS
  Filled 2016-07-03 (×6): qty 1

## 2016-07-03 MED ORDER — IPRATROPIUM BROMIDE 0.03 % NA SOLN
1.0000 | Freq: Three times a day (TID) | NASAL | Status: DC
  Administered 2016-07-04 – 2016-07-05 (×4): 1 via NASAL
  Filled 2016-07-03: qty 30

## 2016-07-03 MED ORDER — LACTULOSE 10 GM/15ML PO SOLN: 10.0000 g | Freq: Every day | ORAL | Status: DC | PRN

## 2016-07-03 MED ORDER — AMLODIPINE BESYLATE 10 MG PO TABS: 10.0000 mg | ORAL_TABLET | Freq: Every day | ORAL | Status: DC

## 2016-07-03 MED ORDER — RENA-VITE PO TABS
1.0000 | ORAL_TABLET | Freq: Every day | ORAL | Status: DC
  Administered 2016-07-04 – 2016-07-05 (×2): 1 via ORAL
  Filled 2016-07-03 (×4): qty 1

## 2016-07-03 MED ORDER — ISOSORBIDE MONONITRATE ER 30 MG PO TB24: 30.0000 mg | ORAL_TABLET | Freq: Two times a day (BID) | ORAL | Status: DC

## 2016-07-03 MED ORDER — DEXTROSE 5 % IV SOLN
500.0000 mg | Freq: Once | INTRAVENOUS | Status: AC
  Administered 2016-07-03: 500 mg via INTRAVENOUS
  Filled 2016-07-03: qty 500

## 2016-07-03 MED ORDER — FLUTICASONE PROPIONATE 50 MCG/ACT NA SUSP
2.0000 | Freq: Every day | NASAL | Status: DC
Start: 2016-07-03 — End: 2016-07-05
  Administered 2016-07-04 – 2016-07-05 (×2): 2 via NASAL
  Filled 2016-07-03: qty 16

## 2016-07-03 MED ORDER — INSULIN GLARGINE 100 UNIT/ML ~~LOC~~ SOLN
30.0000 [IU] | Freq: Every day | SUBCUTANEOUS | Status: DC
  Administered 2016-07-03 – 2016-07-04 (×2): 30 [IU] via SUBCUTANEOUS
  Filled 2016-07-03 (×4): qty 0.3

## 2016-07-03 MED ORDER — CLONIDINE HCL 0.1 MG PO TABS: 0.3000 mg | ORAL_TABLET | Freq: Two times a day (BID) | ORAL | Status: DC

## 2016-07-03 MED ORDER — DEXTROSE 5 % IV SOLN
1.0000 g | INTRAVENOUS | Status: DC
  Administered 2016-07-04: 1 g via INTRAVENOUS
  Filled 2016-07-03 (×2): qty 10

## 2016-07-03 MED ORDER — AMITRIPTYLINE HCL 50 MG PO TABS: 25.0000 mg | ORAL_TABLET | Freq: Every evening | ORAL | Status: DC | PRN

## 2016-07-03 MED ORDER — LATANOPROST 0.005 % OP SOLN
1.0000 [drp] | Freq: Every day | OPHTHALMIC | Status: DC
  Administered 2016-07-03 – 2016-07-04 (×2): 1 [drp] via OPHTHALMIC
  Filled 2016-07-03: qty 2.5

## 2016-07-03 MED ORDER — DELFLEX-LC/1.5% DEXTROSE 346 MOSM/L IP SOLN
INTRAPERITONEAL | Status: DC
  Administered 2016-07-04: 21:00:00 via INTRAPERITONEAL
  Filled 2016-07-03 (×3): qty 3000

## 2016-07-03 MED ORDER — GABAPENTIN 100 MG PO CAPS
100.0000 mg | ORAL_CAPSULE | Freq: Three times a day (TID) | ORAL | Status: DC
  Administered 2016-07-04: 100 mg via ORAL
  Filled 2016-07-03: qty 1

## 2016-07-03 MED ORDER — COLCHICINE 0.6 MG PO TABS
0.6000 mg | ORAL_TABLET | Freq: Every day | ORAL | Status: DC
  Administered 2016-07-04: 0.6 mg via ORAL
  Filled 2016-07-03 (×2): qty 1

## 2016-07-03 MED ORDER — METOCLOPRAMIDE HCL 10 MG PO TABS
10.0000 mg | ORAL_TABLET | Freq: Three times a day (TID) | ORAL | Status: DC | PRN
  Filled 2016-07-03: qty 1

## 2016-07-03 MED ORDER — ASPIRIN EC 81 MG PO TBEC: 81.0000 mg | DELAYED_RELEASE_TABLET | Freq: Every day | ORAL | Status: DC

## 2016-07-03 MED ORDER — SEVELAMER CARBONATE 800 MG PO TABS
2400.0000 mg | ORAL_TABLET | Freq: Three times a day (TID) | ORAL | Status: DC
  Administered 2016-07-04 – 2016-07-05 (×5): 2400 mg via ORAL
  Filled 2016-07-03 (×5): qty 3

## 2016-07-03 MED ORDER — HYDROCODONE-ACETAMINOPHEN 5-325 MG PO TABS
1.0000 | ORAL_TABLET | Freq: Two times a day (BID) | ORAL | Status: DC | PRN
  Administered 2016-07-04: 1 via ORAL
  Filled 2016-07-03: qty 1

## 2016-07-03 MED ORDER — DEXTROSE 5 % IV SOLN
1.0000 g | Freq: Once | INTRAVENOUS | Status: AC
  Administered 2016-07-03: 1 g via INTRAVENOUS
  Filled 2016-07-03: qty 10

## 2016-07-03 MED ORDER — ALLOPURINOL 100 MG PO TABS
100.0000 mg | ORAL_TABLET | Freq: Every day | ORAL | Status: DC
  Administered 2016-07-04 – 2016-07-05 (×2): 100 mg via ORAL
  Filled 2016-07-03 (×2): qty 1

## 2016-07-03 MED ORDER — GENTAMICIN SULFATE 0.1 % EX CREA
1.0000 "application " | TOPICAL_CREAM | Freq: Every day | CUTANEOUS | Status: DC
  Administered 2016-07-04 – 2016-07-05 (×2): 1 via TOPICAL
  Filled 2016-07-03 (×2): qty 15

## 2016-07-03 MED ORDER — ALPRAZOLAM 0.25 MG PO TABS: 0.2500 mg | ORAL_TABLET | Freq: Every evening | ORAL | Status: DC | PRN

## 2016-07-03 MED ORDER — PRAVASTATIN SODIUM 40 MG PO TABS
80.0000 mg | ORAL_TABLET | Freq: Every day | ORAL | Status: DC
  Administered 2016-07-04 – 2016-07-05 (×2): 80 mg via ORAL
  Filled 2016-07-03 (×2): qty 2

## 2016-07-03 MED ORDER — PANTOPRAZOLE SODIUM 40 MG PO TBEC
40.0000 mg | DELAYED_RELEASE_TABLET | Freq: Every day | ORAL | Status: DC
  Administered 2016-07-04 – 2016-07-05 (×2): 40 mg via ORAL
  Filled 2016-07-03 (×2): qty 1

## 2016-07-03 MED ORDER — FERROUS SULFATE 325 (65 FE) MG PO TABS
325.0000 mg | ORAL_TABLET | Freq: Every day | ORAL | Status: DC
  Administered 2016-07-04 – 2016-07-05 (×2): 325 mg via ORAL
  Filled 2016-07-03 (×2): qty 1

## 2016-07-03 MED ORDER — CINACALCET HCL 30 MG PO TABS
90.0000 mg | ORAL_TABLET | Freq: Every day | ORAL | Status: DC
Start: 2016-07-04 — End: 2016-07-05
  Administered 2016-07-04 – 2016-07-05 (×2): 90 mg via ORAL
  Filled 2016-07-03 (×2): qty 3

## 2016-07-03 MED ORDER — DEXTROSE 5 % IV SOLN
250.0000 mg | INTRAVENOUS | Status: DC
  Administered 2016-07-04: 250 mg via INTRAVENOUS
  Filled 2016-07-03 (×2): qty 250

## 2016-07-03 NOTE — H&P (Signed)
[facility] NAME: Laurie Steele    MR#:  [id number]  DATE OF BIRTH:  Jun 21, 1941  DATE OF ADMISSION:  07/03/2016  PRIMARY CARE PHYSICIAN: [name], MD   REQUESTING/REFERRING PHYSICIAN: Dr. Robet Leu  CHIEF COMPLAINT:   Chief Complaint  Patient presents with  . Weakness    HISTORY OF PRESENT ILLNESS: Laurie Steele  is a 75 y.o. female with a known history of Peritoneal dialysis is incisional disease, chronic anemia, gastric esophageal reflux disease, gout, hyperlipidemia, hypertension, diabetes- recently was admitted for anemia in hospital and sent home with physical therapy and home health services 2 weeks ago. As per her son who is also present in the room patient was doing fine up until 3-4 days ago while she was able to walk with a walker when she had appointment with her physicians in last week. For last 3-4 days patient has gradual worsening in her overall weakness and not eating very well. She could not walk much today even at home with support of the family members to go from bed to the dining chair and from there back to the bed. Concerned with this family decided to bring her to emergency room instead of taking her for her the appointment with primary care physician.   PAST MEDICAL HISTORY:   Past Medical History:  Diagnosis Date  . Chronic anemia    Dr Inez Pilgrim  . GERD (gastroesophageal reflux disease)   . Glaucoma   . Gout   . Hx of colonic polyps   . Hyperlipidemia   . Hypertension   . Lower back pain   . Miscarriage   . 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 Peritoneal dialysis status ([facility])   . Renal failure    Dr Holley Raring  . 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 Vaginal delivery    x 4    PAST SURGICAL HISTORY: Past Surgical History:  Procedure Laterality Date  . ABDOMINAL  HYSTERECTOMY  1983  . BACK SURGERY  6/08   Dr Collier Salina  . BACK SURGERY  1987   disc  . CHOLECYSTECTOMY    . PERITONEAL CATHETER INSERTION      SOCIAL HISTORY:  Social History  Substance Use Topics  . Smoking status: Former Smoker    Quit date: 11/05/1990  . Smokeless tobacco: Never Used  . Alcohol use No    FAMILY HISTORY:  Family History  Problem Relation Age of Onset  . Heart attack Father   . Hypertension Mother   . Hyperlipidemia Mother   . Coronary artery disease      strong fam hx  . Breast cancer Neg Hx     DRUG ALLERGIES: No Known Allergies  REVIEW OF SYSTEMS:   CONSTITUTIONAL: No fever,Positive for fatigue or weakness.  EYES: No blurred or double vision.  EARS, NOSE, AND THROAT: No tinnitus or ear pain.  RESPIRATORY: Some cough, no shortness of breath, wheezing or hemoptysis.  CARDIOVASCULAR: No chest pain, orthopnea, edema.  GASTROINTESTINAL: No nausea, vomiting, diarrhea or abdominal pain.  GENITOURINARY: No dysuria, hematuria.  ENDOCRINE: No polyuria, nocturia,  HEMATOLOGY: No anemia, easy bruising or bleeding SKIN: No rash or lesion. MUSCULOSKELETAL: No joint pain or arthritis.   NEUROLOGIC: No tingling, numbness, weakness.  PSYCHIATRY: No anxiety or depression.   MEDICATIONS AT HOME:  Prior to Admission medications  Medication Sig Start Date End Date Taking? Authorizing Provider  allopurinol (ZYLOPRIM) 100 MG tablet Take 1 tablet by mouth daily.    Yes Historical Provider, MD  amitriptyline (ELAVIL) 25 MG tablet Take 1 tablet by mouth at bedtime as needed (For foot pain).  01/02/16  Yes Historical Provider, MD  amLODipine (NORVASC) 10 MG tablet Take 10 mg by mouth daily. Take it mid-day 03/20/16  Yes Historical Provider, MD  aspirin EC 81 MG tablet Take 1 tablet (81 mg total) by mouth daily. 06/18/16  Yes Fritzi Mandes, MD  cinacalcet (SENSIPAR) 90 MG tablet Take 90 mg by mouth daily. With lunch   Yes Historical Provider, MD  cloNIDine (CATAPRES) 0.3 MG  tablet Take 0.3 mg by mouth 2 (two) times daily.   Yes Historical Provider, MD  ferrous sulfate 325 (65 FE) MG tablet Take 1 tablet (325 mg total) by mouth daily with breakfast. 06/18/16  Yes Fritzi Mandes, MD  furosemide (LASIX) 40 MG tablet Take 40 mg by mouth daily.   Yes Historical Provider, MD  gabapentin (NEURONTIN) 100 MG capsule Take 1 capsule by mouth 3 (three) times daily. 08/09/15 08/08/16 Yes Historical Provider, MD  hydrALAZINE (APRESOLINE) 50 MG tablet Take 50 mg by mouth 3 (three) times daily.     Yes Historical Provider, MD  isosorbide mononitrate (IMDUR) 30 MG 24 hr tablet Take 30 mg by mouth 2 (two) times daily.   Yes Historical Provider, MD  losartan (COZAAR) 100 MG tablet Take 100 mg by mouth daily. 03/20/16  Yes Historical Provider, MD  multivitamin (RENA-VIT) TABS tablet Take 1 tablet by mouth daily.   Yes Historical Provider, MD  ACCU-CHEK SOFTCLIX LANCETS lancets 3 (three) times daily. 06/11/16   Historical Provider, MD  ALPRAZolam Duanne Moron) 0.25 MG tablet Take 1 tablet by mouth at bedtime as needed for sleep.     Historical Provider, MD  aspirin (GOODSENSE ASPIRIN) 325 MG tablet TAKE 1 TABLET BY MOUTH EACH DAY 06/18/16   Historical Provider, MD  atenolol (TENORMIN) 25 MG tablet Take 1 tablet by mouth daily.    Historical Provider, MD  colchicine 0.6 MG tablet TAKE ONE TABLET BY MOUTH TWICE DAILY FOR SEVEN DAYS 06/06/16   Historical Provider, MD  cyclobenzaprine (FLEXERIL) 5 MG tablet Take by mouth.    Historical Provider, MD  diclofenac sodium (VOLTAREN) 1 % GEL Apply 2 g four times a day PRN Not a candidate for oral NSAIDS, ESRD on PD 06/27/16   Historical Provider, MD  fluticasone (FLONASE) 50 MCG/ACT nasal spray Place 2 sprays into both nostrils daily.     Historical Provider, MD  HYDROcodone-acetaminophen (NORCO/VICODIN) 5-325 MG tablet Take 1 tablet by mouth 2 (two) times daily as needed for moderate pain.     Historical Provider, MD  insulin aspart (NOVOLOG) 100 UNIT/ML injection  Inject 0-5 Units into the skin at bedtime. 05/28/16   Fritzi Mandes, MD  insulin glargine (LANTUS) 100 UNIT/ML injection Inject 0.42 mLs (42 Units total) into the skin at bedtime. 05/28/16   Fritzi Mandes, MD  insulin lispro protamine-lispro (HUMALOG 50/50 MIX) (50-50) 100 UNIT/ML SUSP injection Inject 0-5 Units into the skin at bedtime. Per sliding scale    Historical Provider, MD  ipratropium (ATROVENT) 0.03 % nasal spray Place 1 spray into both nostrils 3 (three) times daily as needed for rhinitis.    Historical Provider, MD  lactulose (CHRONULAC) 10 GM/15ML solution Take 15 ml to 30 ml daily as needed for constipation. 11/22/15   Historical Provider, MD  latanoprost (XALATAN) 0.005 % ophthalmic solution Place 1 drop into both eyes at bedtime. 03/20/16   Historical Provider, MD  lidocaine (XYLOCAINE) 5 % ointment Apply 1 application topically 3 (three) times daily as needed.    Historical Provider, MD  Lidocaine, Anorectal, 5 % GEL Apply 1 Application topically 3 (three) times daily. 06/12/16   Historical Provider, MD  metoCLOPramide (REGLAN) 10 MG tablet Take 10 mg by mouth 3 (three) times daily as needed for nausea.     Historical Provider, MD  ondansetron (ZOFRAN) 8 MG tablet Take by mouth. 03/01/15   Historical Provider, MD  pantoprazole (PROTONIX) 40 MG tablet Take 1 tablet (40 mg total) by mouth daily. 06/18/16   Fritzi Mandes, MD  pravastatin (PRAVACHOL) 80 MG tablet Take by mouth. 06/21/15   Historical Provider, MD  senna-docusate (SENOKOT-S) 8.6-50 MG tablet Take by mouth. 08/30/15 08/29/16  Historical Provider, MD  sevelamer (RENVELA) 800 MG tablet Take 2,400 mg by mouth 3 (three) times daily with meals.     Historical Provider, MD      PHYSICAL EXAMINATION:   VITAL SIGNS: Blood pressure 123/70, pulse (!) 58, temperature 97.9 F (36.6 C), temperature source Oral, resp. rate (!) 21, height 5\' 7"  (1.702 m), weight 93.8 kg (206 lb 12.8 oz), SpO2 96 %.  GENERAL:  75 y.o.-year-old patient lying in the  bed with no acute distress.  EYES: Pupils equal, round, reactive to light and accommodation. No scleral icterus. Extraocular muscles intact.  HEENT: Head atraumatic, normocephalic. Oropharynx and nasopharynx clear.  NECK:  Supple, no jugular venous distention. No thyroid enlargement, no tenderness.  LUNGS: Normal breath sounds bilaterally, no wheezing,Some crepitation. No use of accessory muscles of respiration.  CARDIOVASCULAR: S1, S2 normal. No murmurs, rubs, or gallops.  ABDOMEN: Soft, nontender, nondistended. Bowel sounds present. No organomegaly or mass. Peritoneal dialysis catheter in place no redness surrounding.  EXTREMITIES: No pedal edema, cyanosis, or clubbing.  NEUROLOGIC: Cranial nerves II through XII are intact. Muscle strength 5/5 in all extremities. Sensation intact. Gait not checked.  PSYCHIATRIC: The patient is alert and oriented x 3.  SKIN: No obvious rash, lesion, or ulcer.   LABORATORY PANEL:   CBC  Recent Labs Lab 07/02/16 1405 07/03/16 1348  WBC 12.0* 12.3*  HGB 9.0* 8.4*  HCT 27.2* 25.1*  PLT 233 197  MCV 100.0 100.4*  MCH 33.1 33.5  MCHC 33.1 33.3  RDW 20.8* 20.9*  LYMPHSABS 2.2  --   MONOABS 1.1*  --   EOSABS 0.4  --   BASOSABS 0.1  --    ------------------------------------------------------------------------------------------------------------------  Chemistries   Recent Labs Lab 07/03/16 1348  NA 136  K 3.4*  CL 97*  CO2 23  GLUCOSE 222*  BUN 66*  CREATININE 13.35*  CALCIUM 7.2*  MG 1.7  AST 25  ALT 22  ALKPHOS 70  BILITOT 0.5   ------------------------------------------------------------------------------------------------------------------ estimated creatinine clearance is 4.2 mL/min (by C-G formula based on SCr of 13.35 mg/dL). ------------------------------------------------------------------------------------------------------------------ No results for input(s): TSH, T4TOTAL, T3FREE, THYROIDAB in the last 72 hours.  Invalid  input(s): FREET3   Coagulation profile No results for input(s): INR, PROTIME in the last 168 hours. ------------------------------------------------------------------------------------------------------------------- No results for input(s): DDIMER in the last 72 hours. -------------------------------------------------------------------------------------------------------------------  Cardiac Enzymes No results for input(s): CKMB, TROPONINI, MYOGLOBIN in the last 168 hours.  Invalid input(s): CK ------------------------------------------------------------------------------------------------------------------ Invalid input(s): POCBNP  ---------------------------------------------------------------------------------------------------------------  Urinalysis    Component Value Date/Time   COLORURINE YELLOW (A) 05/21/2016 1420   APPEARANCEUR CLEAR (A) 05/21/2016 1420  APPEARANCEUR Clear 08/12/2014 2229   LABSPEC 1.013 05/21/2016 1420   LABSPEC 1.009 08/12/2014 2229   PHURINE 5.0 05/21/2016 1420   GLUCOSEU NEGATIVE 05/21/2016 1420   GLUCOSEU 50 mg/dL 08/12/2014 2229   HGBUR 2+ (A) 05/21/2016 1420   HGBUR large 03/12/2007 1127   BILIRUBINUR NEGATIVE 05/21/2016 1420   BILIRUBINUR Negative 08/12/2014 2229   KETONESUR NEGATIVE 05/21/2016 1420   PROTEINUR 30 (A) 05/21/2016 1420   UROBILINOGEN 0.2 04/18/2007 0540   NITRITE NEGATIVE 05/21/2016 1420   LEUKOCYTESUR 2+ (A) 05/21/2016 1420   LEUKOCYTESUR Negative 08/12/2014 2229     RADIOLOGY: Dg Chest Portable 1 View  Result Date: 07/03/2016 CLINICAL DATA:  Altered mental status, weakness EXAM: PORTABLE CHEST 1 VIEW COMPARISON:  Chest x-ray of 05/21/2016 FINDINGS: The lungs are poorly aerated with probable mild atelectasis the bases. Patchy opacity at the left lung base overlying the left heart shadow makes exclusion of pneumonia difficult. Two-view chest x-ray would be helpful if symptoms persist or worsen. Cardiomegaly is stable. No  bony abnormality is seen. IMPRESSION: 1. Poor inspiration with probable basilar volume loss. 2. Cannot exclude patchy pneumonia at the left lung base. Recommend followup two-view chest x-ray. Electronically Signed   By: Ivar Drape M.D.   On: 07/03/2016 14:52    EKG: Orders placed or performed during the hospital encounter of 07/03/16  . ED EKG  . ED EKG    IMPRESSION AND PLAN:  * Generalized weakness with some cough   Community-acquired pneumonia    We'll treat with ceftriaxone and azithromycin.   He also get peritoneal fluid sample to rule out peritonitis.  * End-stage renal disease on peritoneal dialysis   Nephrology consult to help.  * Hypertension   We'll hold some of the medication as patient's blood pressure is running lower normal side.  *  Diabetes   Continue Levemir at lower dose and keep on insulin sliding scale coverage.  * Gout   Continue colchicine.  * Chronic anemia    Recent admission with workup, continue monitoring.   All the records are reviewed and case discussed with ED provider. Management plans discussed with the patient, family and they are in agreement.  CODE STATUS: full code. Code Status History    Date Active Date Inactive Code Status Order ID Comments User Context   06/15/2016  7:15 PM 06/18/2016  8:26 PM Full Code [id number]  Harvie Bridge, DO Inpatient   05/21/2016  4:28 PM 05/28/2016  6:11 PM Full Code [id number]  Henreitta Leber, MD Inpatient   04/05/2016  4:14 PM 04/06/2016  9:07 PM Full Code [id number]  Loletha Grayer, MD ED   03/14/2016 11:50 PM 03/16/2016  3:34 PM Full Code [id number]  Max Sane, MD Inpatient       TOTAL TIME TAKING CARE OF THIS PATIENT: 50  minutes.    Vaughan Basta M.D on 07/03/2016   Between 7am to 6pm - Pager - [phone]  After 6pm go to [email or web address] - password [email or web address] [facility]  Office  [phone]  CC: Primary care physician; [name], MD   Note: This dictation  was prepared with Dragon dictation along with smaller phrase technology. Any transcriptional errors that result from this process are unintentional.

## 2016-07-03 NOTE — Progress Notes (Signed)
Pt had multiple medication changes in last few months,a nd lot of confusion for family and home visiting nurses. We need to go through the listt again, and not all the medicines showing in out system- are the ones she is really taking at home- as per the nurse.  We may need to re-evaluate BP and DM meds on discharge on actual need and doses.

## 2016-07-03 NOTE — ED Triage Notes (Signed)
Patient arrived by EMS from home. Family states patient has had a decline in health the past month and has been seen multiple times at the ER for weakness and sleeping a lot. Also reports pt is now unable to ambulate but ambulated with PT "some" yesterday at home. Son reports pt is seen by PT at home and has home health nurses come to see patient. Patient had a blood transfusion August 14th due to being anemic. EMS vitals Blood sugar 272, blood pressure 92/58, HR 50-60 in A-fib. Patient is very lethargic but when awaken, able to answer questions.

## 2016-07-03 NOTE — Progress Notes (Signed)
Paged and spoke with Dr. Jannifer Franklin regarding patient's elevated lactic acid as well as somnolent state.  Continue to monitor.

## 2016-07-03 NOTE — ED Provider Notes (Signed)
___________________________________   First MD Initiated Contact with Patient 07/03/16 1416     (approximate)  I have reviewed the triage vital signs and the nursing notes.   HISTORY  Chief Complaint Weakness  HPI Laurie Steele is a 75 y.o. female with ESRD on PD for the past 8 years who presents to the ER via EMS with her son because she was unable to stand up to go to her regular doctor's appointment with Dr. Cherre Blanc today. She had home physical therapy yesterday and had difficulty walking than as well but today is completely unable to walk. Son notes she has had one week of productive cough and grayish rhinorrhea. No known fevers. Patient denies any pain, headache, chest pain, belly pain, nausea. She has been doing her PD every night. She does not urinate very often & son notes it's been even less recently.   Past Medical History:  Diagnosis Date  . Chronic anemia    Dr Inez Pilgrim  . GERD (gastroesophageal reflux disease)   . Glaucoma   . Gout   . Hx of colonic polyps   . Hyperlipidemia   . Hypertension   . Lower back pain   . Miscarriage   . 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 Peritoneal dialysis status ([place])   . Renal failure    Dr Holley Raring  . 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 Vaginal delivery    x 4    Patient Active Problem List   Diagnosis Date Noted  . Anemia 06/17/2016  . Symptomatic anemia 06/15/2016  . Hypoglycemia 05/21/2016  . Chest pain 04/05/2016  . Hypercalcemia 03/14/2016  . Bronchitis 03/03/2012  . Type II or unspecified type diabetes mellitus with renal manifestations, not stated as uncontrolled 12/18/2011  . END STAGE RENAL DISEASE 10/23/2010  . NEUROPATHY 12/13/2009  . GOUT 02/15/2009  . SLEEP DISORDER 02/15/2009  . GERD 02/23/2008  . HYPERLIPIDEMIA 05/14/2007  . ANEMIA, CHRONIC 05/14/2007  . HYPERTENSION  05/14/2007    Past Surgical History:  Procedure Laterality Date  . ABDOMINAL HYSTERECTOMY  1983  . BACK SURGERY  6/08   Dr Collier Salina  . BACK SURGERY  1987   disc  . CHOLECYSTECTOMY    . PERITONEAL CATHETER INSERTION      Prior to Admission medications   Medication Sig Start Date End Date Taking? Authorizing Provider  ACCU-CHEK SOFTCLIX LANCETS lancets 3 (three) times daily. 06/11/16   Historical Provider, MD  allopurinol (ZYLOPRIM) 100 MG tablet Take 1 tablet by mouth daily.     Historical Provider, MD  ALPRAZolam Duanne Moron) 0.25 MG tablet Take 1 tablet by mouth at bedtime as needed for sleep.     Historical Provider, MD  amitriptyline (ELAVIL) 25 MG tablet Take 1 tablet by mouth at bedtime as needed (For foot pain).  01/02/16   Historical Provider, MD  amLODipine (NORVASC) 10 MG tablet Take 10 mg by mouth daily. Take it mid-day 03/20/16   Historical Provider, MD  aspirin (GOODSENSE ASPIRIN) 325 MG tablet TAKE 1 TABLET BY MOUTH EACH DAY 06/18/16   Historical Provider, MD  aspirin EC 81 MG tablet Take 1 tablet (81 mg total) by mouth daily. Patient not taking: Reported on 07/02/2016 06/18/16   Fritzi Mandes, MD  atenolol (TENORMIN) 25 MG tablet Take 1 tablet by mouth daily.    Historical Provider, MD  B  Complex-C-Folic Acid (DIALYVITE Q000111Q PO) Take 1 tablet by mouth daily.    Historical Provider, MD  cinacalcet (SENSIPAR) 90 MG tablet Take 90 mg by mouth daily. With lunch    Historical Provider, MD  cloNIDine (CATAPRES) 0.3 MG tablet Take 0.3 mg by mouth 2 (two) times daily.    Historical Provider, MD  colchicine 0.6 MG tablet TAKE ONE TABLET BY MOUTH TWICE DAILY FOR SEVEN DAYS 06/06/16   Historical Provider, MD  cyclobenzaprine (FLEXERIL) 5 MG tablet Take by mouth.    Historical Provider, MD  diclofenac sodium (VOLTAREN) 1 % GEL Apply 2 g four times a day PRN Not a candidate for oral NSAIDS, ESRD on PD 06/27/16   Historical Provider, MD  ferrous sulfate 325 (65 FE) MG tablet Take 1 tablet (325 mg  total) by mouth daily with breakfast. Patient not taking: Reported on 07/02/2016 06/18/16   Fritzi Mandes, MD  fluticasone (FLONASE) 50 MCG/ACT nasal spray Place into the nose.    Historical Provider, MD  gabapentin (NEURONTIN) 100 MG capsule Take 1 capsule by mouth 3 (three) times daily. 08/09/15 08/08/16  Historical Provider, MD  hydrALAZINE (APRESOLINE) 50 MG tablet Take 50 mg by mouth 3 (three) times daily.      Historical Provider, MD  HYDROcodone-acetaminophen (NORCO/VICODIN) 5-325 MG tablet Take 1 tablet by mouth 2 (two) times daily as needed for moderate pain.     Historical Provider, MD  insulin aspart (NOVOLOG) 100 UNIT/ML injection Inject 0-5 Units into the skin at bedtime. Patient not taking: Reported on 06/15/2016 05/28/16   Fritzi Mandes, MD  insulin glargine (LANTUS) 100 UNIT/ML injection Inject 0.42 mLs (42 Units total) into the skin at bedtime. Patient not taking: Reported on 07/02/2016 05/28/16   Fritzi Mandes, MD  insulin lispro protamine-lispro (HUMALOG 50/50 MIX) (50-50) 100 UNIT/ML SUSP injection Inject 0-5 Units into the skin at bedtime. Per sliding scale    Historical Provider, MD  ipratropium (ATROVENT) 0.03 % nasal spray Place 1 spray into both nostrils 3 (three) times daily as needed for rhinitis.    Historical Provider, MD  isosorbide mononitrate (IMDUR) 30 MG 24 hr tablet Take 30 mg by mouth 2 (two) times daily.    Historical Provider, MD  lactulose (CHRONULAC) 10 GM/15ML solution Take 15 ml to 30 ml daily as needed for constipation. 11/22/15   Historical Provider, MD  latanoprost (XALATAN) 0.005 % ophthalmic solution Place 1 drop into both eyes at bedtime. 03/20/16   Historical Provider, MD  lidocaine (XYLOCAINE) 5 % ointment Apply 1 application topically 3 (three) times daily as needed.    Historical Provider, MD  Lidocaine, Anorectal, 5 % GEL Apply 1 Application topically 3 (three) times daily. 06/12/16   Historical Provider, MD  losartan (COZAAR) 100 MG tablet Take 100 mg by mouth daily.  03/20/16   Historical Provider, MD  metoCLOPramide (REGLAN) 10 MG tablet Take 10 mg by mouth 3 (three) times daily as needed for nausea.     Historical Provider, MD  ondansetron (ZOFRAN) 8 MG tablet Take by mouth. 03/01/15   Historical Provider, MD  pantoprazole (PROTONIX) 40 MG tablet Take 1 tablet (40 mg total) by mouth daily. Patient not taking: Reported on 07/02/2016 06/18/16   Fritzi Mandes, MD  pravastatin (PRAVACHOL) 80 MG tablet Take by mouth. 06/21/15   Historical Provider, MD  senna-docusate (SENOKOT-S) 8.6-50 MG tablet Take by mouth. 08/30/15 08/29/16  Historical Provider, MD  sevelamer (RENVELA) 800 MG tablet Take 2,400 mg by mouth 3 (three) times daily with  meals.     Historical Provider, MD    Allergies Review of patient's allergies indicates no known allergies.  Family History  Problem Relation Age of Onset  . Heart attack Father   . Hypertension Mother   . Hyperlipidemia Mother   . Coronary artery disease      strong fam hx  . Breast cancer Neg Hx     Social History Social History  Substance Use Topics  . Smoking status: Former Smoker    Quit date: 11/05/1990  . Smokeless tobacco: Never Used  . Alcohol use No    Review of SystemsConstitutional: No fever/chills Eyes: No visual changes. ENT: No sore throat. Cardiovascular: Denies chest pain. Respiratory: Denies shortness of breath. Gastrointestinal: No abdominal pain.  No nausea, no vomiting.  No diarrhea.  No constipation. Genitourinary: Negative for dysuria. Musculoskeletal: Negative for back pain. Skin: Negative for rash. Neurological: Negative for headaches, focal weakness or numbness. 10-point ROS otherwise negative.  ____________________________________________   PHYSICAL EXAM:  VITAL SIGNS: ED Triage Vitals  Enc Vitals Group     BP 07/03/16 1344 (!) 118/50     Pulse Rate 07/03/16 1344 62     Resp 07/03/16 1344 20     Temp 07/03/16 1344 97.9 F (36.6 C)     Temp Source 07/03/16 1344 Oral     SpO2  07/03/16 1344 93 %     Weight 07/03/16 1346 206 lb 12.8 oz (93.8 kg)     Height 07/03/16 1346 5\' 7"  (1.702 m)     Head Circumference --      Peak Flow --      Pain Score --      Pain Loc --      Pain Edu? --      Excl. in [place]? --     Constitutional: sleepy, but arousable. Well appearing and in no acute distress. Eyes: Conjunctivae are normal. PERRL. EOMI. Head: Atraumatic. Nose: No congestion/rhinnorhea. Mouth/Throat: Mucous membranes are slightly dry.  Oropharynx non-erythematous. Neck: No stridor.  Cardiovascular: Normal rate, regular rhythm. Grossly normal heart sounds.  Good peripheral circulation. Respiratory: Normal respiratory effort.  No retractions. Lungs CTAB. Gastrointestinal: Soft and nontender. No distention. No abdominal bruits. No CVA tenderness. Musculoskeletal: No lower extremity tenderness nor edema.  No joint effusions. Neurologic:  Normal speech and language. No gross focal neurologic deficits are appreciated Skin:  Skin is warm, dry and intact. No rash noted. Psychiatric: Mood and affect are normal. Speech and behavior are normal.  ____________________________________________   LABS (all labs ordered are listed, but only abnormal results are displayed)  Labs Reviewed  BASIC METABOLIC PANEL - Abnormal; Notable for the following:       Result Value   Potassium 3.4 (*)    Chloride 97 (*)    Glucose, Bld 222 (*)    BUN 66 (*)    Creatinine, Ser 13.35 (*)    Calcium 7.2 (*)    GFR calc non Af Amer 2 (*)    GFR calc Af Amer 3 (*)    Anion gap 16 (*)    All other components within normal limits  CBC - Abnormal; Notable for the following:    WBC 12.3 (*)    RBC 2.50 (*)    Hemoglobin 8.4 (*)    HCT 25.1 (*)    MCV 100.4 (*)    RDW 20.9 (*)    All other components within normal limits  HEPATIC FUNCTION PANEL - Abnormal; Notable for the following:  Total Protein 5.9 (*)    Albumin 2.2 (*)    All other components within normal limits  BLOOD GAS,  VENOUS - Abnormal; Notable for the following:    pCO2, Ven 42 (*)    All other components within normal limits  CULTURE, BLOOD (ROUTINE X 2)  CULTURE, BLOOD (ROUTINE X 2)  BODY FLUID CULTURE  MAGNESIUM  LACTIC ACID, PLASMA  LACTIC ACID, PLASMA  CBG MONITORING, ED   ____________________________________________  EKG  ED ECG REPORT I, Ponciano Ort, the attending physician, personally viewed and interpreted this ECG.   Date: 07/03/2016  EKG Time: 1418  Rate: 67  Rhythm: NSr with PACs  Axis: nl  Intervals:nl  ST&T Change: nl ____________________________________________  RADIOLOGY  cxr-IMPRESSION: 1. Poor inspiration with probable basilar volume loss. 2. Cannot exclude patchy pneumonia at the left lung base. Recommend followup two-view chest x-ray.   Electronically Signed   By: Ivar Drape M.D.   On: 07/03/2016 14:52 ____________________________________________   PROCEDURES  Procedure(s) performed:none Procedures  Critical Care performed: none  ____________________________________________   INITIAL IMPRESSION / ASSESSMENT AND PLAN / ED COURSE  Pertinent labs & imaging results that were available during my care of the patient were reviewed by me and considered in my medical decision making (see chart for details).  Will gently hydrate given ESRD & decreased urination per son  Clinical Course   ----------------------------------------- 3:42 PM on 07/03/2016 -----------------------------------------  We'll cover for community-acquired pneumonia given the elevated white count and question of pneumonia on chest x-ray. We'll also send peritoneal fluid for culture.  I d/w Dr. Boyce Medici, prime doc, who will admit.  I d/w nephrologist, Dr. Candiss Norse.  Agrees with CAP coverage; will follow inpt & will order PD culture. ____________________________________________   FINAL CLINICAL IMPRESSION(S) / ED DIAGNOSES  Final diagnoses:  Weakness  Community acquired  pneumonia    NEW MEDICATIONS STARTED DURING THIS VISIT:  New Prescriptions   No medications on file     Note:  This document was prepared using Dragon voice recognition software and may include unintentional dictation errors.    Ponciano Ort, MD 07/03/16 [phone]

## 2016-07-03 NOTE — ED Notes (Signed)
Patient denies pain and is resting comfortably.  

## 2016-07-04 DIAGNOSIS — J189 Pneumonia, unspecified organism: Secondary | ICD-10-CM | POA: Diagnosis not present

## 2016-07-04 DIAGNOSIS — R531 Weakness: Secondary | ICD-10-CM | POA: Diagnosis not present

## 2016-07-04 LAB — CBC
HCT: 27.7 % — ABNORMAL LOW (ref 35.0–47.0)
Hemoglobin: 9.3 g/dL — ABNORMAL LOW (ref 12.0–16.0)
MCH: 33 pg (ref 26.0–34.0)
MCHC: 33.6 g/dL (ref 32.0–36.0)
MCV: 98.4 fL (ref 80.0–100.0)
PLATELETS: 194 10*3/uL (ref 150–440)
RBC: 2.81 MIL/uL — AB (ref 3.80–5.20)
RDW: 20.5 % — AB (ref 11.5–14.5)
WBC: 13 10*3/uL — AB (ref 3.6–11.0)

## 2016-07-04 LAB — LACTIC ACID, PLASMA: LACTIC ACID, VENOUS: 1.8 mmol/L (ref 0.5–1.9)

## 2016-07-04 LAB — BASIC METABOLIC PANEL
ANION GAP: 18 — AB (ref 5–15)
BUN: 74 mg/dL — ABNORMAL HIGH (ref 6–20)
CALCIUM: 7.3 mg/dL — AB (ref 8.9–10.3)
CO2: 22 mmol/L (ref 22–32)
CREATININE: 14.64 mg/dL — AB (ref 0.44–1.00)
Chloride: 97 mmol/L — ABNORMAL LOW (ref 101–111)
GFR, EST AFRICAN AMERICAN: 2 mL/min — AB (ref >60)
GFR, EST NON AFRICAN AMERICAN: 2 mL/min — AB (ref >60)
Glucose, Bld: 205 mg/dL — ABNORMAL HIGH (ref 65–99)
Potassium: 3.6 mmol/L (ref 3.5–5.1)
SODIUM: 137 mmol/L (ref 135–145)

## 2016-07-04 LAB — BODY FLUID CELL COUNT WITH DIFFERENTIAL
EOS FL: 0 %
LYMPHS FL: 0 %
Monocyte-Macrophage-Serous Fluid: 0 %
NEUTROPHIL FLUID: 0 %
Total Nucleated Cell Count, Fluid: 0 cu mm

## 2016-07-04 LAB — GLUCOSE, CAPILLARY
GLUCOSE-CAPILLARY: 144 mg/dL — AB (ref 65–99)
GLUCOSE-CAPILLARY: 234 mg/dL — AB (ref 65–99)
GLUCOSE-CAPILLARY: 305 mg/dL — AB (ref 65–99)
Glucose-Capillary: 214 mg/dL — ABNORMAL HIGH (ref 65–99)

## 2016-07-04 MED ORDER — EPOETIN ALFA 10000 UNIT/ML IJ SOLN
20000.0000 [IU] | INTRAMUSCULAR | Status: DC
  Administered 2016-07-04: 20000 [IU] via SUBCUTANEOUS
  Filled 2016-07-04: qty 2

## 2016-07-04 MED ORDER — GABAPENTIN 100 MG PO CAPS: 100.0000 mg | ORAL_CAPSULE | Freq: Every day | ORAL | Status: DC

## 2016-07-04 MED ORDER — COLCHICINE 0.6 MG PO TABS: 0.3000 mg | ORAL_TABLET | ORAL | Status: DC

## 2016-07-04 MED ORDER — HEPARIN 1000 UNIT/ML FOR PERITONEAL DIALYSIS
500.0000 [IU] | INTRAMUSCULAR | Status: DC | PRN
  Filled 2016-07-04: qty 0.5

## 2016-07-04 MED ORDER — GENTAMICIN SULFATE 0.1 % EX CREA: 1.0000 "application " | TOPICAL_CREAM | Freq: Every day | CUTANEOUS | Status: DC

## 2016-07-04 MED ORDER — ACETAMINOPHEN 325 MG PO TABS
650.0000 mg | ORAL_TABLET | Freq: Four times a day (QID) | ORAL | Status: DC | PRN
  Administered 2016-07-05: 650 mg via ORAL
  Filled 2016-07-04: qty 2

## 2016-07-04 MED ORDER — EPOETIN ALFA 20000 UNIT/ML IJ SOLN
20000.0000 [IU] | INTRAMUSCULAR | Status: DC
  Filled 2016-07-04 (×2): qty 1

## 2016-07-04 NOTE — Progress Notes (Signed)
START OF PD

## 2016-07-04 NOTE — Progress Notes (Signed)
[facility] [place] NAME: Laurie Steele    MR#:  [id number]  DATE OF BIRTH:  1941/06/29  SUBJECTIVE:  CHIEF COMPLAINT:   Chief Complaint  Patient presents with  . Weakness   Cough and sputum, no shortness breasts and wheezing. REVIEW OF SYSTEMS:  Review of Systems  Constitutional: Positive for malaise/fatigue. Negative for chills and fever.  HENT: Negative for congestion and sore throat.   Eyes: Negative for blurred vision and double vision.  Respiratory: Positive for cough and sputum production. Negative for shortness of breath and wheezing.   Cardiovascular: Negative for chest pain, palpitations and leg swelling.  Gastrointestinal: Negative for blood in stool, constipation, diarrhea, melena, nausea and vomiting.  Genitourinary: Negative for dysuria and urgency.  Musculoskeletal: Negative for joint pain.  Skin: Negative for itching and rash.  Neurological: Negative for dizziness, tingling, focal weakness, seizures and headaches.    DRUG ALLERGIES:  No Known Allergies VITALS:  Blood pressure (!) 122/56, pulse 64, temperature 98.3 F (36.8 C), temperature source Oral, resp. rate 18, height 5\' 7"  (1.702 m), weight 220 lb 8 oz (100 kg), SpO2 97 %. PHYSICAL EXAMINATION:  Physical Exam  Constitutional: She is oriented to person, place, and time. No distress.  HENT:  Head: Normocephalic and atraumatic.  Mouth/Throat: Oropharynx is clear and moist.  Eyes: Conjunctivae and EOM are normal. Pupils are equal, round, and reactive to light. No scleral icterus.  Neck: Normal range of motion. Neck supple. No JVD present. No thyromegaly present.  Cardiovascular: Normal rate, regular rhythm and normal heart sounds.  Exam reveals no gallop.   No murmur heard. Pulmonary/Chest: Effort normal and breath sounds normal. No respiratory distress. She has no wheezes. She has no rales.  Abdominal: Soft. Bowel sounds are normal. She exhibits no  distension. There is no tenderness.  Musculoskeletal: Normal range of motion. She exhibits no edema.  Lymphadenopathy:    She has no cervical adenopathy.  Neurological: She is alert and oriented to person, place, and time. No cranial nerve deficit.  Skin: Skin is warm.  Psychiatric: Affect and judgment normal.   LABORATORY PANEL:   CBC  Recent Labs Lab 07/04/16 0640  WBC 13.0*  HGB 9.3*  HCT 27.7*  PLT 194   ------------------------------------------------------------------------------------------------------------------ Chemistries   Recent Labs Lab 07/03/16 1348 07/04/16 0421  NA 136 137  K 3.4* 3.6  CL 97* 97*  CO2 23 22  GLUCOSE 222* 205*  BUN 66* 74*  CREATININE 13.35* 14.64*  CALCIUM 7.2* 7.3*  MG 1.7  --   AST 25  --   ALT 22  --   ALKPHOS 70  --   BILITOT 0.5  --    RADIOLOGY:  Dg Chest Portable 1 View  Result Date: 07/03/2016 CLINICAL DATA:  Altered mental status, weakness EXAM: PORTABLE CHEST 1 VIEW COMPARISON:  Chest x-ray of 05/21/2016 FINDINGS: The lungs are poorly aerated with probable mild atelectasis the bases. Patchy opacity at the left lung base overlying the left heart shadow makes exclusion of pneumonia difficult. Two-view chest x-ray would be helpful if symptoms persist or worsen. Cardiomegaly is stable. No bony abnormality is seen. IMPRESSION: 1. Poor inspiration with probable basilar volume loss. 2. Cannot exclude patchy pneumonia at the left lung base. Recommend followup two-view chest x-ray. Electronically Signed   By: Ivar Drape M.D.   On: 07/03/2016 14:52   ASSESSMENT AND PLAN:   * Generalized weakness: Follow-up PT evaluation.   Community-acquired pneumonia with  leukocytosis.  Continue ceftriaxone and azithromycin. Follow-up CBC.   He also get peritoneal fluid sample to rule out peritonitis.  * End-stage renal disease on peritoneal dialysis PD today per Dr. Candiss Norse.  * Hypertension   continue Lasix, but hold imdur due to low side  diastolic blood pressure.  *  Diabetes   Continue Levemir at lower dose and keep on insulin sliding scale coverage.  * Gout   Continue colchicine.  * Chronic anemia. Stable.   Recent admission with workup, continue monitoring.      All the records are reviewed and case discussed with Care Management/Social Worker. Management plans discussed with the patient, Her grandson and they are in agreement.  CODE STATUS: Full code  TOTAL TIME TAKING CARE OF THIS PATIENT: 33 minutes.   More than 50% of the time was spent in counseling/coordination of care: YES  POSSIBLE D/C IN 1-2 DAYS, DEPENDING ON CLINICAL CONDITION.   Demetrios Loll M.D on 07/04/2016 at 2:38 PM  Between 7am to 6pm - Pager - [phone]  After 6pm go to [email or web address] - Proofreader  [facility]  Office  [phone]  CC: Primary care physician; [name], MD  Note: This dictation was prepared with Dragon dictation along with smaller phrase technology. Any transcriptional errors that result from this process are unintentional.

## 2016-07-04 NOTE — Care Management (Signed)
Patient admitted from home with PNA.  Family lives locally for supported.  Patient ambulates with a walker at home.   Son provides transportation when needed.  Open with [facility] for RN, PT, aide, and speech.  Barbara from [facility] notified of admission. Will need resumption of home health orders if appropriate at discharge.  PT consult pending.  RNCM following

## 2016-07-04 NOTE — Progress Notes (Signed)
PT Cancellation Note  Patient Details Name: Laurie Steele MRN: [id number] DOB: [birth year]   Cancelled Treatment:    Reason Eval/Treat Not Completed: Patient at procedure or test/unavailable.  Nursing reports pt just started peritoneal dialysis in room and won't be available for rest of afternoon (d/t dialysis).  Will re-attempt PT eval tomorrow as medically appropriate.   Raquel Sarna Joyanna Kleman 07/04/2016, 1:14 PM [name], [place]

## 2016-07-04 NOTE — Progress Notes (Signed)
PRE PD ASSESSMENT 

## 2016-07-04 NOTE — Progress Notes (Addendum)
Subjective:   Patient reports productive cough PD was not done overnight. We will arrange for that today No shortness of breath  Objective:  Vital signs in last 24 hours:  Temp:  [97.5 F (36.4 C)-98.3 F (36.8 C)] 98.3 F (36.8 C) (08/30 0524) Pulse Rate:  [55-67] 62 (08/30 0828) Resp:  [14-23] 16 (08/30 0828) BP: (99-157)/(45-70) 113/45 (08/30 0828) SpO2:  [91 %-98 %] 97 % (08/30 0828) Weight:  [93.8 kg (206 lb 12.8 oz)-100 kg (220 lb 8 oz)] 100 kg (220 lb 8 oz) (08/30 0500)  Weight change:  Filed Weights   07/03/16 1346 07/03/16 2012 07/04/16 0500  Weight: 93.8 kg (206 lb 12.8 oz) 97.5 kg (214 lb 14.4 oz) 100 kg (220 lb 8 oz)    Intake/Output:    Intake/Output Summary (Last 24 hours) at 07/04/16 0953 Last data filed at 07/04/16 0757  Gross per 24 hour  Intake              360 ml  Output                0 ml  Net              360 ml     Physical Exam: General: No acute distress, lying in the bed   HEENT Anicteric, moist oral mucous membranes   Neck Supple   Pulm/lungs Bibasilar crackles   CVS/Heart No rub or gallop   Abdomen:  Soft, nontender   Extremities: Trace to 1+ pitting edema   Neurologic: Alert, oriented   Skin: No acute rashes   Access: PD catheter in place        Basic Metabolic Panel:   Recent Labs Lab 07/03/16 1348 07/04/16 0421  NA 136 137  K 3.4* 3.6  CL 97* 97*  CO2 23 22  GLUCOSE 222* 205*  BUN 66* 74*  CREATININE 13.35* 14.64*  CALCIUM 7.2* 7.3*  MG 1.7  --      CBC:  Recent Labs Lab 07/02/16 1405 07/03/16 1348 07/04/16 0640  WBC 12.0* 12.3* 13.0*  NEUTROABS 8.3*  --   --   HGB 9.0* 8.4* 9.3*  HCT 27.2* 25.1* 27.7*  MCV 100.0 100.4* 98.4  PLT 233 197 194      Microbiology:  No results found for this or any previous visit (from the past 720 hour(s)).  Coagulation Studies: No results for input(s): LABPROT, INR in the last 72 hours.  Urinalysis: No results for input(s): COLORURINE, LABSPEC, PHURINE,  GLUCOSEU, HGBUR, BILIRUBINUR, KETONESUR, PROTEINUR, UROBILINOGEN, NITRITE, LEUKOCYTESUR in the last 72 hours.  Invalid input(s): APPERANCEUR    Imaging: Dg Chest Portable 1 View  Result Date: 07/03/2016 CLINICAL DATA:  Altered mental status, weakness EXAM: PORTABLE CHEST 1 VIEW COMPARISON:  Chest x-ray of 05/21/2016 FINDINGS: The lungs are poorly aerated with probable mild atelectasis the bases. Patchy opacity at the left lung base overlying the left heart shadow makes exclusion of pneumonia difficult. Two-view chest x-ray would be helpful if symptoms persist or worsen. Cardiomegaly is stable. No bony abnormality is seen. IMPRESSION: 1. Poor inspiration with probable basilar volume loss. 2. Cannot exclude patchy pneumonia at the left lung base. Recommend followup two-view chest x-ray. Electronically Signed   By: Ivar Drape M.D.   On: 07/03/2016 14:52     Medications:     . allopurinol  100 mg Oral Daily  . amLODipine  10 mg Oral Daily  . aspirin  325 mg Oral Daily  . azithromycin  250  mg Intravenous Q24H  . cefTRIAXone (ROCEPHIN)  IV  1 g Intravenous Q24H  . cinacalcet  90 mg Oral Q breakfast  . cloNIDine  0.3 mg Oral BID  . colchicine  0.6 mg Oral Daily  . dialysis solution 1.5% low-MG/low-CA   Intraperitoneal Q24H  . ferrous sulfate  325 mg Oral Q breakfast  . fluticasone  2 spray Each Nare Daily  . furosemide  40 mg Oral Daily  . gabapentin  100 mg Oral TID  . gentamicin cream  1 application Topical Daily  . heparin  5,000 Units Subcutaneous Q8H  . insulin aspart  0-9 Units Subcutaneous TID WC  . insulin glargine  30 Units Subcutaneous QHS  . ipratropium  1 spray Each Nare TID  . isosorbide mononitrate  30 mg Oral BID  . latanoprost  1 drop Both Eyes QHS  . multivitamin  1 tablet Oral Daily  . pantoprazole  40 mg Oral Daily  . pravastatin  80 mg Oral Daily  . sevelamer carbonate  2,400 mg Oral TID WC   ALPRAZolam, amitriptyline, cyclobenzaprine, heparin,  HYDROcodone-acetaminophen, lactulose, metoCLOPramide  Assessment/ Plan:  75 y.o.African American female  end-stage renal disease, peritoneal dialysis, insulin-dependent diabetes, with complications of retinopathy, nephropathy, glaucoma, GERD, history of gout, hypertension, hyperlipidemia  1. End-stage renal disease We will continue her peritoneal dialysis  2000 cc  6 exchanges 2.5% and 1.5% PD fluid   2. Secondary hyperparathyroidism Continue to periodically monitor phosphorus.  Continue Renvela for now.  3. AOCKD Hgb 9.3 Administer Procrit SW weekly  4. Admitted for pneumonia and confusion - feels better today - PD fluid cell count pending  5. HTN - BP is low normal - Hold BP meds    LOS: 1 Avital Dancy 8/30/20179:53 AM

## 2016-07-04 NOTE — Care Management CC44 (Signed)
Condition Code 44 Documentation Completed  Patient Details  Name: MANDILYN VILLAFANA MRN: [id number] Date of Birth: Mar 19, 1941   Condition Code 44 given:  Yes Patient signature on Condition Code 44 notice:  Yes Documentation of 2 MD's agreement:  Yes Code 44 added to claim:  Yes    Beverly Sessions, RN 07/04/2016, 3:58 PM

## 2016-07-05 DIAGNOSIS — R531 Weakness: Secondary | ICD-10-CM | POA: Diagnosis not present

## 2016-07-05 DIAGNOSIS — J189 Pneumonia, unspecified organism: Secondary | ICD-10-CM | POA: Diagnosis not present

## 2016-07-05 LAB — BASIC METABOLIC PANEL
ANION GAP: 17 — AB (ref 5–15)
BUN: 66 mg/dL — ABNORMAL HIGH (ref 6–20)
CHLORIDE: 98 mmol/L — AB (ref 101–111)
CO2: 21 mmol/L — AB (ref 22–32)
Calcium: 7.6 mg/dL — ABNORMAL LOW (ref 8.9–10.3)
Creatinine, Ser: 12.64 mg/dL — ABNORMAL HIGH (ref 0.44–1.00)
GFR calc non Af Amer: 2 mL/min — ABNORMAL LOW (ref >60)
GFR, EST AFRICAN AMERICAN: 3 mL/min — AB (ref >60)
Glucose, Bld: 190 mg/dL — ABNORMAL HIGH (ref 65–99)
Potassium: 3.1 mmol/L — ABNORMAL LOW (ref 3.5–5.1)
Sodium: 136 mmol/L (ref 135–145)

## 2016-07-05 LAB — RENAL FUNCTION PANEL
Albumin: 2.3 g/dL — ABNORMAL LOW (ref 3.5–5.0)
Anion gap: 15 (ref 5–15)
BUN: 61 mg/dL — AB (ref 6–20)
CALCIUM: 7.5 mg/dL — AB (ref 8.9–10.3)
CO2: 23 mmol/L (ref 22–32)
Chloride: 96 mmol/L — ABNORMAL LOW (ref 101–111)
Creatinine, Ser: 12.65 mg/dL — ABNORMAL HIGH (ref 0.44–1.00)
GFR calc Af Amer: 3 mL/min — ABNORMAL LOW (ref >60)
GFR calc non Af Amer: 2 mL/min — ABNORMAL LOW (ref >60)
GLUCOSE: 175 mg/dL — AB (ref 65–99)
POTASSIUM: 2.6 mmol/L — AB (ref 3.5–5.1)
Phosphorus: 6.5 mg/dL — ABNORMAL HIGH (ref 2.5–4.6)
SODIUM: 134 mmol/L — AB (ref 135–145)

## 2016-07-05 LAB — CBC
HCT: 28.2 % — ABNORMAL LOW (ref 35.0–47.0)
Hemoglobin: 9.7 g/dL — ABNORMAL LOW (ref 12.0–16.0)
MCH: 33.6 pg (ref 26.0–34.0)
MCHC: 34.5 g/dL (ref 32.0–36.0)
MCV: 97.4 fL (ref 80.0–100.0)
PLATELETS: 198 10*3/uL (ref 150–440)
RBC: 2.89 MIL/uL — ABNORMAL LOW (ref 3.80–5.20)
RDW: 20.1 % — AB (ref 11.5–14.5)
WBC: 9.1 10*3/uL (ref 3.6–11.0)

## 2016-07-05 LAB — GLUCOSE, CAPILLARY
Glucose-Capillary: 202 mg/dL — ABNORMAL HIGH (ref 65–99)
Glucose-Capillary: 183 mg/dL — ABNORMAL HIGH (ref 65–99)

## 2016-07-05 LAB — POTASSIUM: POTASSIUM: 3.4 mmol/L — AB (ref 3.5–5.1)

## 2016-07-05 MED ORDER — COLCHICINE 0.6 MG PO TABS: 0.3000 mg | ORAL_TABLET | ORAL | 2 refills | Status: AC

## 2016-07-05 MED ORDER — FUROSEMIDE 40 MG PO TABS: 40.0000 mg | ORAL_TABLET | Freq: Every day | ORAL | 2 refills | Status: AC

## 2016-07-05 MED ORDER — AMLODIPINE BESYLATE 10 MG PO TABS
10.0000 mg | ORAL_TABLET | Freq: Every day | ORAL | Status: DC
  Administered 2016-07-05: 10 mg via ORAL
  Filled 2016-07-05: qty 1

## 2016-07-05 MED ORDER — POTASSIUM CHLORIDE CRYS ER 20 MEQ PO TBCR
40.0000 meq | EXTENDED_RELEASE_TABLET | Freq: Once | ORAL | Status: AC
  Administered 2016-07-05: 40 meq via ORAL
  Filled 2016-07-05: qty 2

## 2016-07-05 MED ORDER — GABAPENTIN 100 MG PO CAPS: 100.0000 mg | ORAL_CAPSULE | Freq: Every day | ORAL | 0 refills | Status: DC

## 2016-07-05 MED ORDER — AZITHROMYCIN 250 MG PO TABS: 250.0000 mg | ORAL_TABLET | Freq: Every day | ORAL | 0 refills | Status: DC

## 2016-07-05 NOTE — Progress Notes (Signed)
Subjective:   Doing fair today Total UF 650 cc with  peritoneal dialysis Still coughing but not as much Able to eat without nausea or vomiting  Objective:  Vital signs in last 24 hours:  Temp:  [98 F (36.7 C)-98.3 F (36.8 C)] 98 F (36.7 C) (08/31 0500) Pulse Rate:  [64-84] 84 (08/31 0500) Resp:  [18-19] 19 (08/31 0500) BP: (117-176)/(53-54) 176/54 (08/31 0500) SpO2:  [94 %-98 %] 95 % (08/31 0500) Weight:  [101 kg (222 lb 10.6 oz)] 101 kg (222 lb 10.6 oz) (08/31 0845)  Weight change:  Filed Weights   07/03/16 2012 07/04/16 0500 07/05/16 0845  Weight: 97.5 kg (214 lb 14.4 oz) 100 kg (220 lb 8 oz) 101 kg (222 lb 10.6 oz)    Intake/Output:    Intake/Output Summary (Last 24 hours) at 07/05/16 1149 Last data filed at 07/05/16 0900  Gross per 24 hour  Intake              770 ml  Output             1285 ml  Net             -515 ml     Physical Exam: General: No acute distress, laying in the bed   HEENT Anicteric   Neck Supple   Pulm/lungs Normal breathing effort, clear to auscultation   CVS/Heart Soft systolic murmur, no rub or gallop   Abdomen:  Soft, nontender   Extremities: Trace to 1+ edema   Neurologic: Alert, oriented   Skin: No acute rashes   Access: PD catheter in place, exit site nontender        Basic Metabolic Panel:   Recent Labs Lab 07/03/16 1348 07/04/16 0421 07/05/16 0518 07/05/16 1055  NA 136 137 134* 136  K 3.4* 3.6 2.6* 3.1*  CL 97* 97* 96* 98*  CO2 23 22 23  21*  GLUCOSE 222* 205* 175* 190*  BUN 66* 74* 61* 66*  CREATININE 13.35* 14.64* 12.65* 12.64*  CALCIUM 7.2* 7.3* 7.5* 7.6*  MG 1.7  --   --   --   PHOS  --   --  6.5*  --      CBC:  Recent Labs Lab 07/02/16 1405 07/03/16 1348 07/04/16 0640 07/05/16 0518  WBC 12.0* 12.3* 13.0* 9.1  NEUTROABS 8.3*  --   --   --   HGB 9.0* 8.4* 9.3* 9.7*  HCT 27.2* 25.1* 27.7* 28.2*  MCV 100.0 100.4* 98.4 97.4  PLT 233 197 194 198      Microbiology:  Recent Results (from the  past 720 hour(s))  Blood culture (routine x 2)     Status: None (Preliminary result)   Collection Time: 07/03/16  2:19 PM  Result Value Ref Range Status   Specimen Description BLOOD  RIGHT HAND   Final   Special Requests   Final    BOTTLES DRAWN AEROBIC AND ANAEROBIC  ANA 8ML AER 5ML   Culture NO GROWTH 2 DAYS  Final   Report Status PENDING  Incomplete  Blood culture (routine x 2)     Status: None (Preliminary result)   Collection Time: 07/03/16  2:21 PM  Result Value Ref Range Status   Specimen Description BLOOD  RIGHT ARM  Final   Special Requests   Final    BOTTLES DRAWN AEROBIC AND ANAEROBIC  ANA 9ML AER 7ML   Culture NO GROWTH 2 DAYS  Final   Report Status PENDING  Incomplete  Body fluid  culture     Status: None (Preliminary result)   Collection Time: 07/04/16 12:30 PM  Result Value Ref Range Status   Specimen Description PERITONEAL  Final   Special Requests NONE  Final   Gram Stain NO WBC SEEN NO ORGANISMS SEEN   Final   Culture PENDING  Incomplete   Report Status PENDING  Incomplete    Coagulation Studies: No results for input(s): LABPROT, INR in the last 72 hours.  Urinalysis: No results for input(s): COLORURINE, LABSPEC, PHURINE, GLUCOSEU, HGBUR, BILIRUBINUR, KETONESUR, PROTEINUR, UROBILINOGEN, NITRITE, LEUKOCYTESUR in the last 72 hours.  Invalid input(s): APPERANCEUR    Imaging: Dg Chest Portable 1 View  Result Date: 07/03/2016 CLINICAL DATA:  Altered mental status, weakness EXAM: PORTABLE CHEST 1 VIEW COMPARISON:  Chest x-ray of 05/21/2016 FINDINGS: The lungs are poorly aerated with probable mild atelectasis the bases. Patchy opacity at the left lung base overlying the left heart shadow makes exclusion of pneumonia difficult. Two-view chest x-ray would be helpful if symptoms persist or worsen. Cardiomegaly is stable. No bony abnormality is seen. IMPRESSION: 1. Poor inspiration with probable basilar volume loss. 2. Cannot exclude patchy pneumonia at the left lung  base. Recommend followup two-view chest x-ray. Electronically Signed   By: Ivar Drape M.D.   On: 07/03/2016 14:52     Medications:     . allopurinol  100 mg Oral Daily  . amLODipine  10 mg Oral Daily  . aspirin  325 mg Oral Daily  . azithromycin  250 mg Intravenous Q24H  . cefTRIAXone (ROCEPHIN)  IV  1 g Intravenous Q24H  . cinacalcet  90 mg Oral Q breakfast  . [START ON 07/09/2016] colchicine  0.3 mg Oral Once per day on Mon Thu  . dialysis solution 1.5% low-MG/low-CA   Intraperitoneal Q24H  . epoetin (EPOGEN/PROCRIT) injection  20,000 Units Subcutaneous Q Wed  . ferrous sulfate  325 mg Oral Q breakfast  . fluticasone  2 spray Each Nare Daily  . furosemide  40 mg Oral Daily  . gabapentin  100 mg Oral QHS  . gentamicin cream  1 application Topical Daily  . heparin  5,000 Units Subcutaneous Q8H  . insulin aspart  0-9 Units Subcutaneous TID WC  . insulin glargine  30 Units Subcutaneous QHS  . ipratropium  1 spray Each Nare TID  . latanoprost  1 drop Both Eyes QHS  . multivitamin  1 tablet Oral Daily  . pantoprazole  40 mg Oral Daily  . pravastatin  80 mg Oral Daily  . sevelamer carbonate  2,400 mg Oral TID WC   acetaminophen, ALPRAZolam, amitriptyline, cyclobenzaprine, heparin, heparin, HYDROcodone-acetaminophen, lactulose, metoCLOPramide  Assessment/ Plan:  75 y.o.African American female  end-stage renal disease, peritoneal dialysis, insulin-dependent diabetes, with complications of retinopathy, nephropathy, glaucoma, GERD, history of gout, hypertension, hyperlipidemia  1. End-stage renal disease We will continue her peritoneal dialysis  2000 cc  6 exchanges 2.5% and 1.5% PD fluid The patient is discharged today she will continue her home prescription   2. Secondary hyperparathyroidism Continue to periodically monitor phosphorus.  Continue Renvela for now. Most recent phosphorus level is 6.5 Follow phosphorus diet  3. AOCKD Hgb 9.7 Administer Procrit SW weekly  4.  Admitted for pneumonia and confusion - feels better today - PD fluid cell count is 0   5. HTN - BP is high today. Restart home pressure medication regimen    LOS: 1 Laurie Steele 8/31/201711:49 AM

## 2016-07-05 NOTE — Progress Notes (Signed)
PT Cancellation Note  Patient Details Name: Laurie Steele MRN: [id number] DOB: [birth year]   Cancelled Treatment:    Reason Eval/Treat Not Completed: Other (comment).  PT consult received.  Chart reviewed.  Potassium critical lab value of 2.6 noted.  Per PT guidelines, will hold PT at this time (d/t critical potassium) and re-attempt PT eval at a later date/time.   Raquel Sarna [name] 07/05/2016, 11:06 AM [name], [place]

## 2016-07-05 NOTE — Progress Notes (Signed)
Lab called to give critical lab value, potassium of 2.6, that was a 3.6 yesterday. MD paged, Dr. Estanislado Pandy to order 67mEq of PO potassium once. Will continue to monitor. Conley Simmonds, RN

## 2016-07-05 NOTE — Discharge Summary (Signed)
[place] [facility] [place] NAME: Laurie Steele    MR#:  [id number]  DATE OF BIRTH:  [birth year]  DATE OF ADMISSION:  07/03/2016   ADMITTING PHYSICIAN: Vaughan Basta, MD  DATE OF DISCHARGE:  07/05/2016  PRIMARY CARE PHYSICIAN: [name], MD   ADMISSION DIAGNOSIS:  Weakness [R53.1] Community acquired pneumonia [J18.9] DISCHARGE DIAGNOSIS:  Principal Problem:   Pneumonia  SECONDARY DIAGNOSIS:   Past Medical History:  Diagnosis Date  . Chronic anemia    Dr Inez Pilgrim  . GERD (gastroesophageal reflux disease)   . Glaucoma   . Gout   . Hx of colonic polyps   . Hyperlipidemia   . Hypertension   . Lower back pain   . Miscarriage   . 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 Peritoneal dialysis status ([place])   . Renal failure    Dr Holley Raring  . 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 Vaginal delivery    x 4   HOSPITAL COURSE:   * Generalized weakness: better. Need resume HHPT.  Community-acquired pneumonia with leukocytosis.  Treated with ceftriaxone and azithromycin. continue zithromax.Peritoneal fluid test did not show peritonitis.  * End-stage renal disease on peritoneal dialysis PD per Dr. Candiss Norse.  * Hypertension  continue Lasix, but hold imdur due to low side diastolic blood pressure.  * Diabetes Continue Levemir at lower dose and keep on insulin sliding scale coverage.  * Gout Continue colchicine.  * Chronic anemia. Stable.  Hypokalemia. K2.6 this am, Given potassium supplement. Potassium increased to 3.1. Discussed with Dr. Candiss Norse. DISCHARGE CONDITIONS:  Stable, discharge to home with home health and PT CONSULTS OBTAINED:  Treatment Team:  Murlean Iba, MD DRUG ALLERGIES:  No Known Allergies DISCHARGE MEDICATIONS:     Medication List    STOP taking these  medications   atenolol 25 MG tablet Commonly known as:  TENORMIN   cloNIDine 0.3 MG tablet Commonly known as:  CATAPRES   hydrALAZINE 50 MG tablet Commonly known as:  APRESOLINE   isosorbide mononitrate 30 MG 24 hr tablet Commonly known as:  IMDUR   losartan 100 MG tablet Commonly known as:  COZAAR     TAKE these medications   allopurinol 100 MG tablet Commonly known as:  ZYLOPRIM Take 1 tablet by mouth daily.   ALPRAZolam 0.25 MG tablet Commonly known as:  XANAX Take 1 tablet by mouth at bedtime as needed for sleep.   amitriptyline 25 MG tablet Commonly known as:  ELAVIL Take 1 tablet by mouth at bedtime as needed (For foot pain).   amLODipine 10 MG tablet Commonly known as:  NORVASC Take 10 mg by mouth daily. Take it mid-day   aspirin EC 81 MG tablet Take 1 tablet (81 mg total) by mouth daily.   azithromycin 250 MG tablet Commonly known as:  ZITHROMAX Take 1 tablet (250 mg total) by mouth daily.   cinacalcet 90 MG tablet Commonly known as:  SENSIPAR Take 90 mg by mouth daily. With lunch   colchicine 0.6 MG tablet Take 0.5 tablets (0.3 mg total) by mouth 2 (two) times a week. Start taking on:  07/09/2016 What changed:  See the new instructions.   cyclobenzaprine 5 MG tablet Commonly known as:  FLEXERIL Take by mouth.   diclofenac sodium 1 % Gel Commonly known as:  VOLTAREN Apply 2 g  four times a day PRN Not a candidate for oral NSAIDS, ESRD on PD   ferrous sulfate 325 (65 FE) MG tablet Take 1 tablet (325 mg total) by mouth daily with breakfast.   fluticasone 50 MCG/ACT nasal spray Commonly known as:  FLONASE Place 2 sprays into both nostrils daily.   furosemide 40 MG tablet Commonly known as:  LASIX Take 1 tablet (40 mg total) by mouth daily. What changed:  how much to take   gabapentin 100 MG capsule Commonly known as:  NEURONTIN Take 1 capsule (100 mg total) by mouth at bedtime. What changed:  when to take this   HYDROcodone-acetaminophen  5-325 MG tablet Commonly known as:  NORCO/VICODIN Take 1 tablet by mouth 2 (two) times daily as needed for moderate pain.   insulin aspart 100 UNIT/ML injection Commonly known as:  novoLOG Inject 0-5 Units into the skin at bedtime. What changed:  additional instructions   insulin glargine 100 UNIT/ML injection Commonly known as:  LANTUS Inject 0.42 mLs (42 Units total) into the skin at bedtime.   ipratropium 0.03 % nasal spray Commonly known as:  ATROVENT Place 1 spray into both nostrils 3 (three) times daily as needed for rhinitis.   lactulose 10 GM/15ML solution Commonly known as:  CHRONULAC Take 15 ml to 30 ml daily as needed for constipation.   latanoprost 0.005 % ophthalmic solution Commonly known as:  XALATAN Place 1 drop into both eyes at bedtime.   Lidocaine (Anorectal) 5 % Gel Apply 1 Application topically 3 (three) times daily.   lidocaine 5 % ointment Commonly known as:  XYLOCAINE Apply 1 application topically 3 (three) times daily as needed.   metoCLOPramide 10 MG tablet Commonly known as:  REGLAN Take 10 mg by mouth 3 (three) times daily as needed for nausea.   multivitamin Tabs tablet Take 1 tablet by mouth daily.   ondansetron 8 MG tablet Commonly known as:  ZOFRAN Take 8 mg by mouth every 8 (eight) hours as needed for nausea.   pantoprazole 40 MG tablet Commonly known as:  PROTONIX Take 1 tablet (40 mg total) by mouth daily.   pravastatin 80 MG tablet Commonly known as:  PRAVACHOL Take 40 mg by mouth at bedtime.   senna-docusate 8.6-50 MG tablet Commonly known as:  Senokot-S 1 tablet daily.   sevelamer carbonate 800 MG tablet Commonly known as:  RENVELA Take 800 mg by mouth 3 (three) times daily with meals.        DISCHARGE INSTRUCTIONS:   DIET:  Renal diet DISCHARGE CONDITION:  Stable ACTIVITY:  As tolerated 0} DISCHARGE LOCATION:    If you experience worsening of your admission symptoms, develop shortness of breath, life  threatening emergency, suicidal or homicidal thoughts you must seek medical attention immediately by calling 911 or calling your MD immediately  if symptoms less severe.  You Must read complete instructions/literature along with all the possible adverse reactions/side effects for all the Medicines you take and that have been prescribed to you. Take any new Medicines after you have completely understood and accpet all the possible adverse reactions/side effects.   Please note  You were cared for by a hospitalist during your hospital stay. If you have any questions about your discharge medications or the care you received while you were in the hospital after you are discharged, you can call the unit and asked to speak with the hospitalist on call if the hospitalist that took care of you is not available. Once you are  discharged, your primary care physician will handle any further medical issues. Please note that NO REFILLS for any discharge medications will be authorized once you are discharged, as it is imperative that you return to your primary care physician (or establish a relationship with a primary care physician if you do not have one) for your aftercare needs so that they can reassess your need for medications and monitor your lab values.    On the day of Discharge:  VITAL SIGNS:  Blood pressure (!) 175/77, pulse 82, temperature 98.7 F (37.1 C), temperature source Oral, resp. rate 20, height 5\' 7"  (1.702 m), weight 222 lb 10.6 oz (101 kg), SpO2 95 %. PHYSICAL EXAMINATION:  GENERAL:  75 y.o.-year-old patient lying in the bed with no acute distress.  EYES: Pupils equal, round, reactive to light and accommodation. No scleral icterus. Extraocular muscles intact.  HEENT: Head atraumatic, normocephalic. Oropharynx and nasopharynx clear.  NECK:  Supple, no jugular venous distention. No thyroid enlargement, no tenderness.  LUNGS: Normal breath sounds bilaterally, no wheezing, rales,rhonchi or  crepitation. No use of accessory muscles of respiration.  CARDIOVASCULAR: S1, S2 normal. No murmurs, rubs, or gallops.  ABDOMEN: Soft, non-tender, non-distended. Bowel sounds present. No organomegaly or mass.  EXTREMITIES: No pedal edema, cyanosis, or clubbing.  NEUROLOGIC: Cranial nerves II through XII are intact. Muscle strength 5/5 in all extremities. Sensation intact. Gait not checked.  PSYCHIATRIC: The patient is alert and oriented x 3.  SKIN: No obvious rash, lesion, or ulcer.  DATA REVIEW:   CBC  Recent Labs Lab 07/05/16 0518  WBC 9.1  HGB 9.7*  HCT 28.2*  PLT 198    Chemistries   Recent Labs Lab 07/03/16 1348  07/05/16 1055  NA 136  < > 136  K 3.4*  < > 3.1*  CL 97*  < > 98*  CO2 23  < > 21*  GLUCOSE 222*  < > 190*  BUN 66*  < > 66*  CREATININE 13.35*  < > 12.64*  CALCIUM 7.2*  < > 7.6*  MG 1.7  --   --   AST 25  --   --   ALT 22  --   --   ALKPHOS 70  --   --   BILITOT 0.5  --   --   < > = values in this interval not displayed.   Microbiology Results  Results for orders placed or performed during the hospital encounter of 07/03/16  Blood culture (routine x 2)     Status: None (Preliminary result)   Collection Time: 07/03/16  2:19 PM  Result Value Ref Range Status   Specimen Description BLOOD  RIGHT HAND   Final   Special Requests   Final    BOTTLES DRAWN AEROBIC AND ANAEROBIC  ANA 8ML AER 5ML   Culture NO GROWTH 2 DAYS  Final   Report Status PENDING  Incomplete  Blood culture (routine x 2)     Status: None (Preliminary result)   Collection Time: 07/03/16  2:21 PM  Result Value Ref Range Status   Specimen Description BLOOD  RIGHT ARM  Final   Special Requests   Final    BOTTLES DRAWN AEROBIC AND ANAEROBIC  ANA 9ML AER 7ML   Culture NO GROWTH 2 DAYS  Final   Report Status PENDING  Incomplete  Body fluid culture     Status: None (Preliminary result)   Collection Time: 07/04/16 12:30 PM  Result Value Ref Range Status   Specimen  Description  PERITONEAL  Final   Special Requests NONE  Final   Gram Stain NO WBC SEEN NO ORGANISMS SEEN   Final   Culture   Final    NO GROWTH < 24 HOURS Performed at [facility]    Report Status PENDING  Incomplete    RADIOLOGY:  No results found.   Management plans discussed with the patient, family and they are in agreement.  CODE STATUS:     Code Status Orders        Start     Ordered   07/03/16 2015  Full code  Continuous     07/03/16 2015    Code Status History    Date Active Date Inactive Code Status Order ID Comments User Context   06/15/2016  7:15 PM 06/18/2016  8:26 PM Full Code [id number]  Harvie Bridge, DO Inpatient   05/21/2016  4:28 PM 05/28/2016  6:11 PM Full Code [id number]  Henreitta Leber, MD Inpatient   04/05/2016  4:14 PM 04/06/2016  9:07 PM Full Code [id number]  Loletha Grayer, MD ED   03/14/2016 11:50 PM 03/16/2016  3:34 PM Full Code [id number]  Max Sane, MD Inpatient      TOTAL TIME TAKING CARE OF THIS PATIENT: 35 minutes.    Demetrios Loll M.D on 07/05/2016 at 1:47 PM  Between 7am to 6pm - Pager - [phone]  After 6pm go to [email or web address] - Proofreader  [facility]  Office  [phone]  CC: Primary care physician; [name], MD   Note: This dictation was prepared with Dragon dictation along with smaller phrase technology. Any transcriptional errors that result from this process are unintentional.

## 2016-07-05 NOTE — Evaluation (Signed)
Physical Therapy Evaluation Patient Details Name: Laurie Steele MRN: [id number] DOB: Nov 20, 1940 Today's Date: 07/05/2016   History of Present Illness  Pt is a 75 y.o. female presenting to hospital with generalized weakness with cough.  Pt admitted with community acquired PNA with leukocytosis.  Of note pt with recent hospital admission d/t anemia.  PMH includes peritoneal dialysis (x8 years), chronic anemia, htn, LBP, NIDDM, retinopathy.  Clinical Impression  Prior to admission, pt was ambulating with RW short household distances (usually had someone with her when she was performing functional mobility).  Pt lives with her son in 1 level home with level entry.  Currently pt is min assist supine to sit and CGA with transfers and ambulation 40 feet with RW.  Pt would benefit from skilled PT to address noted impairments and functional limitations.  Recommend pt discharge to home with use of RW, HHPT, and supervision for mobility/OOB when medically appropriate.     Follow Up Recommendations Home health PT;Supervision for mobility/OOB    Equipment Recommendations   (pt reports already owning RW)    Recommendations for Other Services       Precautions / Restrictions Precautions Precautions: Fall Precaution Comments: Peritoneal dialysis port Restrictions Weight Bearing Restrictions: No      Mobility  Bed Mobility Overal bed mobility: Needs Assistance Bed Mobility: Supine to Sit     Supine to sit: HOB elevated;Min assist     General bed mobility comments: pt reports she usually takes someone's hand to aide in sitting up; has bed that [facility] elevates  Transfers Overall transfer level: Needs assistance Equipment used: Rolling walker (2 wheeled) Transfers: Sit to/from Stand Sit to Stand: Min guard         General transfer comment: pt initially taking multiple trials to stand up but after standing and then sitting back down for a few minutes pt then stood strong without any loss  of balance  Ambulation/Gait Ambulation/Gait assistance: Min guard Ambulation Distance (Feet): 40 Feet Assistive device: Rolling walker (2 wheeled) Gait Pattern/deviations:  (pt initially unable to advance LE's upon first stand but after sitting down and resting a few minutes pt then able to ambulate and initiate gait well with CGA) Gait velocity: decreased   General Gait Details: decreased B step length/foot clearance/heelstrike with wide BOS but steady without loss of balance  Stairs            Wheelchair Mobility    Modified Rankin (Stroke Patients Only)       Balance Overall balance assessment: Needs assistance;History of Falls Sitting-balance support: Bilateral upper extremity supported;Feet supported Sitting balance-Leahy Scale: Good     Standing balance support: Bilateral upper extremity supported (on RW) Standing balance-Leahy Scale: Good                               Pertinent Vitals/Pain Pain Assessment: No/denies pain  Vitals stable and WFL throughout treatment session.    Home Living Family/patient expects to be discharged to:: Private residence Living Arrangements: Children (Pt's son) Available Help at Discharge: Family   Home Access: Level entry     Home Layout: One level Home Equipment: Environmental consultant - 2 wheels;Shower seat      Prior Function Level of Independence: Independent with assistive device(s);Needs assistance   Gait / Transfers Assistance Needed: Pt usually has assist with bed mobility, transfers, and ambulation  ADL's / Homemaking Assistance Needed: Daughter assist with bathing  Comments: Pt reports daughter  assists with bathing and grooming but also has an aide that comes to assist with other things.  Had HHPT prior to admission.  Son provides transportation.  Pt reports someone is always around majority of the time and tends to sit around if they leave to run an errand.     Hand Dominance        Extremity/Trunk  Assessment   Upper Extremity Assessment: Generalized weakness           Lower Extremity Assessment: Generalized weakness         Communication   Communication: No difficulties  Cognition Arousal/Alertness: Awake/alert Behavior During Therapy: WFL for tasks assessed/performed Overall Cognitive Status: Within Functional Limits for tasks assessed                      General Comments General comments (skin integrity, edema, etc.): Pt laying in bed resting upon PT arrival.  Pt agreeable to PT session and repeat potassium noted to be 3.1.    Exercises        Assessment/Plan    PT Assessment Patient needs continued PT services  PT Diagnosis Difficulty walking;Generalized weakness   PT Problem List Decreased strength;Decreased activity tolerance;Decreased balance;Decreased mobility  PT Treatment Interventions DME instruction;Gait training;Functional mobility training;Therapeutic activities;Therapeutic exercise;Balance training;Patient/family education   PT Goals (Current goals can be found in the Care Plan section) Acute Rehab PT Goals Patient Stated Goal: to go home PT Goal Formulation: With patient Time For Goal Achievement: 07/19/16 Potential to Achieve Goals: Good    Frequency Min 2X/week   Barriers to discharge        Co-evaluation               End of Session Equipment Utilized During Treatment: Gait belt Activity Tolerance: Patient tolerated treatment well Patient left: in bed;with call bell/phone within reach;with bed alarm set Nurse Communication: Mobility status;Precautions    Functional Assessment Tool Used: AM-PAC without stairs Functional Limitation: Mobility: Walking and moving around Mobility: Walking and Moving Around Current Status [id number]): At least 40 percent but less than 60 percent impaired, limited or restricted Mobility: Walking and Moving Around Goal Status [phone]): At least 1 percent but less than 20 percent impaired, limited  or restricted    Time: 1155-1218 PT Time Calculation (min) (ACUTE ONLY): 23 min   Charges:   PT Evaluation $PT Eval Low Complexity: 1 Procedure     PT G Codes:   PT G-Codes **NOT FOR INPATIENT CLASS** Functional Assessment Tool Used: AM-PAC without stairs Functional Limitation: Mobility: Walking and moving around Mobility: Walking and Moving Around Current Status [id number]): At least 40 percent but less than 60 percent impaired, limited or restricted Mobility: Walking and Moving Around Goal Status [phone]): At least 1 percent but less than 20 percent impaired, limited or restricted    [name] 07/05/2016, 2:11 PM [name], [place]

## 2016-07-05 NOTE — Care Management (Signed)
Plan for patient to discharge home today.  Resumption of home health orders placed for PT, RN, aide, and speech.  I have notified Pamala Hurry from [facility] of discharge plan.  PT to assess patient prior to discharge.  RNCM signing off.

## 2016-07-05 NOTE — Discharge Instructions (Signed)
Renal diet. Activity as tolerated. Resume HHPT.

## 2016-07-08 LAB — BODY FLUID CULTURE
Culture: NO GROWTH
Gram Stain: NONE SEEN

## 2016-07-08 LAB — CULTURE, BLOOD (ROUTINE X 2)
CULTURE: NO GROWTH
CULTURE: NO GROWTH

## 2016-07-09 DIAGNOSIS — R768 Other specified abnormal immunological findings in serum: Secondary | ICD-10-CM | POA: Insufficient documentation

## 2016-07-21 ENCOUNTER — Emergency Department
Admission: EM | Admit: 2016-07-21 | Discharge: 2016-07-22 | Disposition: A | Discharge status: Home / Self Care | Payer: Medicare Other | Attending: Emergency Medicine | Admitting: Emergency Medicine

## 2016-07-21 ENCOUNTER — Emergency Department: Payer: Medicare Other

## 2016-07-21 DIAGNOSIS — R6883 Chills (without fever): Secondary | ICD-10-CM | POA: Insufficient documentation

## 2016-07-21 DIAGNOSIS — R600 Localized edema: Secondary | ICD-10-CM | POA: Insufficient documentation

## 2016-07-21 DIAGNOSIS — R5383 Other fatigue: Secondary | ICD-10-CM | POA: Diagnosis not present

## 2016-07-21 DIAGNOSIS — R531 Weakness: Secondary | ICD-10-CM | POA: Insufficient documentation

## 2016-07-21 DIAGNOSIS — Z794 Long term (current) use of insulin: Secondary | ICD-10-CM | POA: Diagnosis not present

## 2016-07-21 DIAGNOSIS — Z791 Long term (current) use of non-steroidal anti-inflammatories (NSAID): Secondary | ICD-10-CM | POA: Insufficient documentation

## 2016-07-21 DIAGNOSIS — Z7982 Long term (current) use of aspirin: Secondary | ICD-10-CM | POA: Diagnosis not present

## 2016-07-21 DIAGNOSIS — Z992 Dependence on renal dialysis: Secondary | ICD-10-CM | POA: Insufficient documentation

## 2016-07-21 DIAGNOSIS — Z79899 Other long term (current) drug therapy: Secondary | ICD-10-CM | POA: Insufficient documentation

## 2016-07-21 DIAGNOSIS — E1122 Type 2 diabetes mellitus with diabetic chronic kidney disease: Secondary | ICD-10-CM | POA: Insufficient documentation

## 2016-07-21 DIAGNOSIS — N186 End stage renal disease: Secondary | ICD-10-CM | POA: Insufficient documentation

## 2016-07-21 DIAGNOSIS — R5381 Other malaise: Secondary | ICD-10-CM | POA: Diagnosis not present

## 2016-07-21 DIAGNOSIS — Z87891 Personal history of nicotine dependence: Secondary | ICD-10-CM | POA: Insufficient documentation

## 2016-07-21 DIAGNOSIS — I120 Hypertensive chronic kidney disease with stage 5 chronic kidney disease or end stage renal disease: Secondary | ICD-10-CM | POA: Insufficient documentation

## 2016-07-21 LAB — CBC WITH DIFFERENTIAL/PLATELET
Basophils Absolute: 0 10*3/uL (ref 0–0.1)
Basophils Relative: 0 %
Eosinophils Absolute: 0.2 10*3/uL (ref 0–0.7)
Eosinophils Relative: 2 %
HCT: 29.5 % — ABNORMAL LOW (ref 35.0–47.0)
HEMOGLOBIN: 10.1 g/dL — AB (ref 12.0–16.0)
LYMPHS ABS: 1.7 10*3/uL (ref 1.0–3.6)
LYMPHS PCT: 16 %
MCH: 35 pg — AB (ref 26.0–34.0)
MCHC: 34.2 g/dL (ref 32.0–36.0)
MCV: 102.4 fL — AB (ref 80.0–100.0)
Monocytes Absolute: 1 10*3/uL — ABNORMAL HIGH (ref 0.2–0.9)
Monocytes Relative: 10 %
NEUTROS PCT: 72 %
Neutro Abs: 7.7 10*3/uL — ABNORMAL HIGH (ref 1.4–6.5)
Platelets: 280 10*3/uL (ref 150–440)
RBC: 2.88 MIL/uL — AB (ref 3.80–5.20)
RDW: 21.7 % — ABNORMAL HIGH (ref 11.5–14.5)
WBC: 10.7 10*3/uL (ref 3.6–11.0)

## 2016-07-21 LAB — COMPREHENSIVE METABOLIC PANEL
ALK PHOS: 103 U/L (ref 38–126)
ALT: 49 U/L (ref 14–54)
AST: 51 U/L — ABNORMAL HIGH (ref 15–41)
Albumin: 2.8 g/dL — ABNORMAL LOW (ref 3.5–5.0)
Anion gap: 13 (ref 5–15)
BUN: 22 mg/dL — ABNORMAL HIGH (ref 6–20)
CALCIUM: 8.5 mg/dL — AB (ref 8.9–10.3)
CO2: 27 mmol/L (ref 22–32)
CREATININE: 4.83 mg/dL — AB (ref 0.44–1.00)
Chloride: 96 mmol/L — ABNORMAL LOW (ref 101–111)
GFR, EST AFRICAN AMERICAN: 9 mL/min — AB (ref >60)
GFR, EST NON AFRICAN AMERICAN: 8 mL/min — AB (ref >60)
Glucose, Bld: 151 mg/dL — ABNORMAL HIGH (ref 65–99)
Potassium: 3.7 mmol/L (ref 3.5–5.1)
Sodium: 136 mmol/L (ref 135–145)
Total Bilirubin: 0.7 mg/dL (ref 0.3–1.2)
Total Protein: 7.2 g/dL (ref 6.5–8.1)

## 2016-07-21 LAB — LACTIC ACID, PLASMA: LACTIC ACID, VENOUS: 1.1 mmol/L (ref 0.5–1.9)

## 2016-07-21 LAB — TROPONIN I: TROPONIN I: 0.04 ng/mL — AB (ref <0.03)

## 2016-07-21 MED ORDER — VANCOMYCIN HCL IN DEXTROSE 1-5 GM/200ML-% IV SOLN
1000.0000 mg | Freq: Once | INTRAVENOUS | Status: AC
  Administered 2016-07-22: 1000 mg via INTRAVENOUS
  Filled 2016-07-21: qty 200

## 2016-07-21 MED ORDER — PIPERACILLIN-TAZOBACTAM 3.375 G IVPB 30 MIN
3.3750 g | Freq: Once | INTRAVENOUS | Status: AC
  Administered 2016-07-21: 3.375 g via INTRAVENOUS
  Filled 2016-07-21: qty 50

## 2016-07-21 NOTE — ED Notes (Signed)
Pt daughter is leaving for the evening and would like to be called with pt update - Her name is Melody [phone]

## 2016-07-21 NOTE — ED Notes (Signed)
Lab called troponin of 0.04 - reported to Dr Alfred Levins - also asked pt if she could give a urine sample and she stated not now but to give her a little bit and she would try - Dr Alfred Levins is aware that we are waiting on pt to void - at this time no in and out cath

## 2016-07-21 NOTE — Discharge Instructions (Addendum)
Return to the emergency room if you have a fever, malaise, generalized weakness, chest pain, abdominal pain, shortness of breath, or any other symptoms concerning to you. Otherwise follow-up with her doctor on Monday.

## 2016-07-21 NOTE — ED Notes (Signed)
Pt arrived via ems for c/o lethargy and weakness after dialysis this am at 11 - this is her 3rd time going to dialysis  - she has also been experiencing some nausea today since dialysis - pt denies pain - denies shortness of breath - reports dizzy earlier today

## 2016-07-21 NOTE — ED Triage Notes (Signed)
Pt arrived via ems for c/o lethargy and weakness after dialysis this am at 11 - this is her 3rd time going to dialysis  - she has also been experiencing some nausea today since dialysis

## 2016-07-21 NOTE — ED Provider Notes (Signed)
[facility] Emergency Department Provider Note  ____________________________________________  Time seen: Approximately 8:44 PM  I have reviewed the triage vital signs and the nursing notes.   HISTORY  Chief Complaint Weakness   HPI Laurie Steele is a 75 y.o. female with h/o ESRD on PD for 8 years who started HD this week, chronic anemia, hypertension, hyperlipidemia, diabetes who presents for evaluation of weakness, malaise, and chills. Patient reports that she was feeling well until she went to dialysis earlier today. After she received her treatment she started feeling fatigued and having chills. She went home and is gotten progressively worse. She denies nausea or vomiting, chest pain or shortness of breath, fever, headache, rash, dysuria, abdominal pain. She has had a cough productive of clear phlegm since having pneumonia on 07/05/16 (admitted here). She reports the reason she was switched to hemodialysis was because her PD was no longer working. She still has the catheter for PD and has a new catheter on the right chest wall for hemodialysis. She reports she still makes a small amount of urine.  Past Medical History:  Diagnosis Date  . Chronic anemia    Dr Inez Pilgrim  . GERD (gastroesophageal reflux disease)   . Glaucoma   . Gout   . Hx of colonic polyps   . Hyperlipidemia   . Hypertension   . Lower back pain   . Miscarriage   . 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 Peritoneal dialysis status ([place])   . Renal failure    Dr Holley Raring  . 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 Vaginal delivery    x 4    Patient Active Problem List   Diagnosis Date Noted  . Ds DNA antibody positive 07/09/2016  . Pneumonia 07/03/2016  . Anemia 06/17/2016  . Symptomatic anemia 06/15/2016  . Hypoglycemia 05/21/2016  . Chest pain 04/05/2016  .  Hypercalcemia 03/14/2016  . Bronchitis 03/03/2012  . Type II or unspecified type diabetes mellitus with renal manifestations, not stated as uncontrolled 12/18/2011  . END STAGE RENAL DISEASE 10/23/2010  . NEUROPATHY 12/13/2009  . GOUT 02/15/2009  . SLEEP DISORDER 02/15/2009  . GERD 02/23/2008  . HYPERLIPIDEMIA 05/14/2007  . ANEMIA, CHRONIC 05/14/2007  . HYPERTENSION 05/14/2007    Past Surgical History:  Procedure Laterality Date  . ABDOMINAL HYSTERECTOMY  1983  . BACK SURGERY  6/08   Dr Collier Salina  . BACK SURGERY  1987   disc  . CHOLECYSTECTOMY    . PERITONEAL CATHETER INSERTION      Prior to Admission medications   Medication Sig Start Date End Date Taking? Authorizing Provider  allopurinol (ZYLOPRIM) 100 MG tablet Take 1 tablet by mouth daily.     Historical Provider, MD  ALPRAZolam Duanne Moron) 0.25 MG tablet Take 1 tablet by mouth at bedtime as needed for sleep.     Historical Provider, MD  amitriptyline (ELAVIL) 25 MG tablet Take 1 tablet by mouth at bedtime as needed (For foot pain).  01/02/16   Historical Provider, MD  amLODipine (NORVASC) 10 MG tablet Take 10 mg by mouth daily. Take it mid-day 03/20/16   Historical Provider, MD  aspirin EC 81 MG tablet Take 1 tablet (81 mg total) by mouth daily. 06/18/16   Fritzi Mandes, MD  azithromycin (ZITHROMAX) 250 MG tablet Take 1 tablet (250 mg total) by mouth daily.  07/05/16   Demetrios Loll, MD  cinacalcet (SENSIPAR) 90 MG tablet Take 90 mg by mouth daily. With lunch    Historical Provider, MD  colchicine 0.6 MG tablet Take 0.5 tablets (0.3 mg total) by mouth 2 (two) times a week. 07/09/16   Demetrios Loll, MD  cyclobenzaprine (FLEXERIL) 5 MG tablet Take by mouth.    Historical Provider, MD  diclofenac sodium (VOLTAREN) 1 % GEL Apply 2 g four times a day PRN Not a candidate for oral NSAIDS, ESRD on PD 06/27/16   Historical Provider, MD  ferrous sulfate 325 (65 FE) MG tablet Take 1 tablet (325 mg total) by mouth daily with breakfast. 06/18/16   Fritzi Mandes,  MD  fluticasone (FLONASE) 50 MCG/ACT nasal spray Place 2 sprays into both nostrils daily.     Historical Provider, MD  furosemide (LASIX) 40 MG tablet Take 1 tablet (40 mg total) by mouth daily. 07/05/16   Demetrios Loll, MD  gabapentin (NEURONTIN) 100 MG capsule Take 1 capsule (100 mg total) by mouth at bedtime. 07/05/16   Demetrios Loll, MD  HYDROcodone-acetaminophen (NORCO/VICODIN) 5-325 MG tablet Take 1 tablet by mouth 2 (two) times daily as needed for moderate pain.     Historical Provider, MD  insulin aspart (NOVOLOG) 100 UNIT/ML injection Inject 0-5 Units into the skin at bedtime. Patient taking differently: Inject 0-5 Units into the skin at bedtime. If blood sugar is 70-120, no insulin. If 121-250 take 2 units, 251-300 take 3 units, 301-350 take 4 units, 351-400 take 5 units. If over 400 call physician. 05/28/16   Fritzi Mandes, MD  insulin glargine (LANTUS) 100 UNIT/ML injection Inject 0.42 mLs (42 Units total) into the skin at bedtime. 05/28/16   Fritzi Mandes, MD  ipratropium (ATROVENT) 0.03 % nasal spray Place 1 spray into both nostrils 3 (three) times daily as needed for rhinitis.    Historical Provider, MD  lactulose (CHRONULAC) 10 GM/15ML solution Take 15 ml to 30 ml daily as needed for constipation. 11/22/15   Historical Provider, MD  latanoprost (XALATAN) 0.005 % ophthalmic solution Place 1 drop into both eyes at bedtime. 03/20/16   Historical Provider, MD  lidocaine (XYLOCAINE) 5 % ointment Apply 1 application topically 3 (three) times daily as needed.    Historical Provider, MD  Lidocaine, Anorectal, 5 % GEL Apply 1 Application topically 3 (three) times daily. 06/12/16   Historical Provider, MD  metoCLOPramide (REGLAN) 10 MG tablet Take 10 mg by mouth 3 (three) times daily as needed for nausea.     Historical Provider, MD  multivitamin (RENA-VIT) TABS tablet Take 1 tablet by mouth daily.    Historical Provider, MD  ondansetron (ZOFRAN) 8 MG tablet Take 8 mg by mouth every 8 (eight) hours as needed for  nausea.  03/01/15   Historical Provider, MD  pantoprazole (PROTONIX) 40 MG tablet Take 1 tablet (40 mg total) by mouth daily. 06/18/16   Fritzi Mandes, MD  pravastatin (PRAVACHOL) 80 MG tablet Take 40 mg by mouth at bedtime.  06/21/15   Historical Provider, MD  senna-docusate (SENOKOT-S) 8.6-50 MG tablet 1 tablet daily.  08/30/15 08/29/16  Historical Provider, MD  sevelamer (RENVELA) 800 MG tablet Take 800 mg by mouth 3 (three) times daily with meals.     Historical Provider, MD    Allergies Review of patient's allergies indicates no known allergies.  Family History  Problem Relation Age of Onset  . Heart attack Father   . Hypertension Mother   . Hyperlipidemia Mother   .  Coronary artery disease      strong fam hx  . Breast cancer Neg Hx     Social History Social History  Substance Use Topics  . Smoking status: Former Smoker    Quit date: 11/05/1990  . Smokeless tobacco: Never Used  . Alcohol use No    Review of Systems  Constitutional: Negative for fever. + chills, malaise and weakness Eyes: Negative for visual changes. ENT: Negative for sore throat. Cardiovascular: Negative for chest pain. Respiratory: Negative for shortness of breath. Gastrointestinal: Negative for abdominal pain, vomiting or diarrhea. Genitourinary: Negative for dysuria. Musculoskeletal: Negative for back pain. Skin: Negative for rash. Neurological: Negative for headaches, weakness or numbness.  ____________________________________________   PHYSICAL EXAM:  VITAL SIGNS: ED Triage Vitals  Enc Vitals Group     BP 07/21/16 2029 132/67     Pulse Rate 07/21/16 2029 96     Resp 07/21/16 2029 16     Temp 07/21/16 2029 98.7 F (37.1 C)     Temp src --      SpO2 07/21/16 2029 97 %     Weight 07/21/16 2030 216 lb (98 kg)     Height 07/21/16 2030 5\' 7"  (1.702 m)     Head Circumference --      Peak Flow --      Pain Score 07/21/16 2030 0     Pain Loc --      Pain Edu? --      Excl. in [place]? --      Constitutional: Alert and oriented, looks tired but no distress.  HEENT:      Head: Normocephalic and atraumatic.         Eyes: Conjunctivae are normal. Sclera is non-icteric. EOMI. PERRL      Mouth/Throat: Mucous membranes are moist.       Neck: Supple with no signs of meningismus. Cardiovascular: Regular rate and rhythm. No murmurs, gallops, or rubs. 2+ symmetrical distal pulses are present in all extremities. No JVD. Respiratory: Normal respiratory effort. Lungs are clear to auscultation bilaterally. No wheezes, crackles, or rhonchi. Dialysis port with no evidence of infection. Gastrointestinal: Soft, non tender, and non distended with positive bowel sounds. No rebound or guarding. PD catheter with no evidence of surrounding infection.  Musculoskeletal: Trace edema b/l. RLE larger than LLE (chronic per patient) Neurologic: Normal speech and language. Face is symmetric. Moving all extremities. No gross focal neurologic deficits are appreciated. Skin: Skin is warm, dry and intact. No rash noted. Psychiatric: Mood and affect are normal. Speech and behavior are normal.  ____________________________________________   LABS (all labs ordered are listed, but only abnormal results are displayed)  Labs Reviewed  CBC WITH DIFFERENTIAL/PLATELET - Abnormal; Notable for the following:       Result Value   RBC 2.88 (*)    Hemoglobin 10.1 (*)    HCT 29.5 (*)    MCV 102.4 (*)    MCH 35.0 (*)    RDW 21.7 (*)    Neutro Abs 7.7 (*)    Monocytes Absolute 1.0 (*)    All other components within normal limits  COMPREHENSIVE METABOLIC PANEL - Abnormal; Notable for the following:    Chloride 96 (*)    Glucose, Bld 151 (*)    BUN 22 (*)    Creatinine, Ser 4.83 (*)    Calcium 8.5 (*)    Albumin 2.8 (*)    AST 51 (*)    GFR calc non Af Amer 8 (*)  GFR calc Af Amer 9 (*)    All other components within normal limits  TROPONIN I - Abnormal; Notable for the following:    Troponin I 0.04 (*)     All other components within normal limits  CULTURE, BLOOD (ROUTINE X 2)  CULTURE, BLOOD (ROUTINE X 2)  URINE CULTURE  LACTIC ACID, PLASMA  URINALYSIS COMPLETEWITH MICROSCOPIC ([facility] ONLY)   ____________________________________________  EKG  ED ECG REPORT I, Rudene Re, the attending physician, personally viewed and interpreted this ECG.  Sinus tachycardia, rate of 104, normal PR and QRS intervals, prolonged QTC, left axis deviation, no ST elevations or depressions. Unchanged from prior  ____________________________________________  RADIOLOGY  CXR: Negative ____________________________________________   PROCEDURES  Procedure(s) performed: None Procedures Critical Care performed:  None ____________________________________________   INITIAL IMPRESSION / ASSESSMENT AND PLAN / ED COURSE  75 y.o. female with h/o ESRD on PD for 8 years who started HD this week, chronic anemia, hypertension, hyperlipidemia, diabetes who presents for evaluation of weakness, malaise, and chills since finishing HD earlier today. Patient is afebrile however tachycardic and complaining of chills and malaise. Multiple possible sources of infection with PD and HD catheter, urine, PNA with cough. Possibly over dialyzed today causing dehydration. Will check labs, urinalysis, chest x-ray, blood cultures, lactate.  Clinical Course  Comment By Time  Patient reports resolution of her symptoms and is feeling back to her baseline with no interventions. Her blood work here shows a normal white count, stable hemoglobin, normal lactic acid at 1.1. CXR negative for recurrent PNA. Blood cultures and urine culture sent. This patient has multiple possible sites for bacteremia we'll give her a dose of IV vancomycin and IV Zosyn at this time until her blood cultures result. However since she is feeling back to her baseline we'll discharge her home. She will follow-up with her nephrologist on Monday for her dialysis  session. I instructed patient to return to the emergency room if she has fever or if she has recurrence of her weakness or any other symptoms concerning to her. Patient is comfortable with this plan. UA is pending at this time. This will be followed by Dr. Dahlia Client. Rudene Re, MD 09/16 2338    Pertinent labs & imaging results that were available during my care of the patient were reviewed by me and considered in my medical decision making (see chart for details).    ____________________________________________   FINAL CLINICAL IMPRESSION(S) / ED DIAGNOSES  Final diagnoses:  Generalized weakness      NEW MEDICATIONS STARTED DURING THIS VISIT:  New Prescriptions   No medications on file     Note:  This document was prepared using Dragon voice recognition software and may include unintentional dictation errors.     Rudene Re, MD 07/21/16 2342

## 2016-07-21 NOTE — ED Notes (Signed)
Assisted pt to the bathroom and she was unable to void - she does not want in and out cath - will give her a little more time to void

## 2016-07-22 ENCOUNTER — Encounter: Payer: Self-pay | Admitting: Emergency Medicine

## 2016-07-22 DIAGNOSIS — R531 Weakness: Secondary | ICD-10-CM | POA: Diagnosis not present

## 2016-07-22 MED ORDER — IBUPROFEN 600 MG PO TABS
600.0000 mg | ORAL_TABLET | Freq: Once | ORAL | Status: AC
  Administered 2016-07-22: 600 mg via ORAL
  Filled 2016-07-22: qty 1

## 2016-07-22 NOTE — ED Notes (Signed)
Pt repositioned

## 2016-07-22 NOTE — ED Notes (Signed)
Contacted patient's brother Philemon Kingdom at [phone]. He is on his way to pick up the patient.

## 2016-07-22 NOTE — ED Notes (Signed)
Called son Theresa Duty, and daughter Jennet Maduro to arrange for pick up patient.  Were not able to reach anyone.

## 2016-07-22 NOTE — ED Notes (Signed)
Entered pt's room to pt having gown removed, bp cuff and o2 sensory removed. Pt states she was ready to go home. Ena Dawley entered room and helped me assist pt to the bathroom. Pt complains of elbow pain asking for pain medication.

## 2016-07-22 NOTE — ED Notes (Signed)
Contacted son Rosette Bellavance) and he said he would arrange for a ride for pt (his mother) and call back.

## 2016-07-22 NOTE — ED Notes (Signed)
Assisted patient from the wheelchair to the bed.

## 2016-07-22 NOTE — ED Notes (Signed)
Came in pt's room to pt with leads off and undressing. Asked pt why she was taking them off and she said "I'm going to bed, don't you change for bed?" Informed pt she was still in the hospital but I would assist her in changing upon discharge. Pt was satisfied with that response.

## 2016-07-22 NOTE — ED Notes (Signed)
Attempted to assist pt to bathroom, pt states "she didn't have to go right now."

## 2016-07-22 NOTE — ED Provider Notes (Signed)
-----------------------------------------   2:39 AM on 07/22/2016 -----------------------------------------   Blood pressure (!) 144/55, pulse 93, temperature 98.7 F (37.1 C), resp. rate 17, height 5\' 7"  (1.702 m), weight 216 lb (98 kg), SpO2 100 %.  Assuming care from Dr. Kelli Hope.  In short, Laurie Steele is a 75 y.o. female with a chief complaint of Weakness .  Refer to the original H&P for additional details.  The current plan of care is to discharge the patient when she's receive her antibiotics.  The patient completed her antibiotics. She did complain of some pain in her elbow slightly give her some ibuprofen. She'll be discharged to home.Loney Hering, MD 07/22/16 [phone]

## 2016-07-22 NOTE — ED Notes (Signed)
Pt's daughter Jennet Maduro is here to pick up patient.

## 2016-07-22 NOTE — ED Notes (Signed)
Pt sitting on edge of bed swaying. Pt informed that was not a safe position and assisted back into bed. Reiterated to pt to use call bell if she needed to get up.

## 2016-07-26 LAB — CULTURE, BLOOD (ROUTINE X 2)
Culture: NO GROWTH
Culture: NO GROWTH

## 2016-08-03 ENCOUNTER — Inpatient Hospital Stay: Payer: Medicare Other

## 2016-08-03 ENCOUNTER — Inpatient Hospital Stay: Payer: Medicare Other | Admitting: Hematology and Oncology

## 2016-08-03 NOTE — Progress Notes (Deleted)
[facility] day:  08/03/2016  Chief Complaint: Laurie Steele is a 75 y.o. female with ESRD on peritoneal dialysis and anemia who is seen for 1 month assessment.  HPI:   The patient was last seen in the hematology clinic on 07/02/2016.  At that time, she felt tired.  She denied any fever.  Her performance status was poor.  She was only able to feed herself.  CBC revealed a hematocrit of 27.2, hemoglobin 9.0, and MCV 100.  She was to follow-up with rheumatology regarding a high titer + DS DNA.    Her diet is good.  She eats grilled chicken and tuna.  She avoids lean meat.  She denies pica.  She denies any melena or hematochezia.  She denies any hematuria or vaginal bleeding.  She previously saw Dr. Candace Cruise.  His notes indicate an EGD and colonoscopy in 2013 and a capsule study in 2014 were negative.  Stool hemoccult was negative on 05/24/2016 and 06/17/2016. The patient has seen by Dr. San Jetty, gastroenterologist.  Plan was for EGD/colonoscopy if stool guaiac was positive.  She is followed by Dr. Holley Raring in nephrology.  She receives Procrit once a month through [facility].   Symptomatically, she feels tired.  She denies any fever.  Her performance status is poor.  She is only able to feed herself.    Past Medical History:  Diagnosis Date  . Chronic anemia    Dr Inez Pilgrim  . GERD (gastroesophageal reflux disease)   . Glaucoma   . Gout   . Hx of colonic polyps   . Hyperlipidemia   . Hypertension   . Lower back pain   . Miscarriage   . 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 Peritoneal dialysis status ([place])   . Renal failure    Dr Holley Raring  . 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 Vaginal delivery    x 4    Past Surgical History:  Procedure Laterality Date  . ABDOMINAL HYSTERECTOMY  1983  . BACK SURGERY  6/08   Dr  Collier Salina  . BACK SURGERY  1987   disc  . CHOLECYSTECTOMY    . PERITONEAL CATHETER INSERTION      Family History  Problem Relation Age of Onset  . Heart attack Father   . Hypertension Mother   . Hyperlipidemia Mother   . Coronary artery disease      strong fam hx  . Breast cancer Neg Hx     Social History:  reports that she quit smoking about 25 years ago. She has never used smokeless tobacco. She reports that she does not drink alcohol or use drugs.  Contact number is [phone].  The patient is accompanied by her son, [name], today.  Allergies: No Known Allergies  Current Medications: Current Outpatient Prescriptions  Medication Sig Dispense Refill  . allopurinol (ZYLOPRIM) 100 MG tablet Take 1 tablet by mouth daily.     Marland Kitchen ALPRAZolam (XANAX) 0.25 MG tablet Take 1 tablet by mouth at bedtime as needed for sleep.     Marland Kitchen amitriptyline (ELAVIL) 25 MG tablet Take 1 tablet by mouth at bedtime as needed (For foot pain).   3  . amLODipine (NORVASC) 10 MG tablet Take 10 mg by mouth daily. Take it mid-day  11  . aspirin EC 81 MG tablet  Take 1 tablet (81 mg total) by mouth daily. 30 tablet 1  . azithromycin (ZITHROMAX) 250 MG tablet Take 1 tablet (250 mg total) by mouth daily. 6 each 0  . cinacalcet (SENSIPAR) 90 MG tablet Take 90 mg by mouth daily. With lunch    . colchicine 0.6 MG tablet Take 0.5 tablets (0.3 mg total) by mouth 2 (two) times a week. 4 tablet 2  . cyclobenzaprine (FLEXERIL) 5 MG tablet Take by mouth.    . diclofenac sodium (VOLTAREN) 1 % GEL Apply 2 g four times a day PRN Not a candidate for oral NSAIDS, ESRD on PD    . ferrous sulfate 325 (65 FE) MG tablet Take 1 tablet (325 mg total) by mouth daily with breakfast. 30 tablet 3  . fluticasone (FLONASE) 50 MCG/ACT nasal spray Place 2 sprays into both nostrils daily.     . furosemide (LASIX) 40 MG tablet Take 1 tablet (40 mg total) by mouth daily. 30 tablet 2  . gabapentin (NEURONTIN) 100 MG capsule Take 1 capsule (100 mg  total) by mouth at bedtime. 30 capsule 0  . HYDROcodone-acetaminophen (NORCO/VICODIN) 5-325 MG tablet Take 1 tablet by mouth 2 (two) times daily as needed for moderate pain.     Marland Kitchen insulin aspart (NOVOLOG) 100 UNIT/ML injection Inject 0-5 Units into the skin at bedtime. (Patient taking differently: Inject 0-5 Units into the skin at bedtime. If blood sugar is 70-120, no insulin. If 121-250 take 2 units, 251-300 take 3 units, 301-350 take 4 units, 351-400 take 5 units. If over 400 call physician.) 10 mL 11  . insulin glargine (LANTUS) 100 UNIT/ML injection Inject 0.42 mLs (42 Units total) into the skin at bedtime. 7 vial 3  . ipratropium (ATROVENT) 0.03 % nasal spray Place 1 spray into both nostrils 3 (three) times daily as needed for rhinitis.    Marland Kitchen lactulose (CHRONULAC) 10 GM/15ML solution Take 15 ml to 30 ml daily as needed for constipation.    Marland Kitchen latanoprost (XALATAN) 0.005 % ophthalmic solution Place 1 drop into both eyes at bedtime.  6  . lidocaine (XYLOCAINE) 5 % ointment Apply 1 application topically 3 (three) times daily as needed.    . Lidocaine, Anorectal, 5 % GEL Apply 1 Application topically 3 (three) times daily.    . metoCLOPramide (REGLAN) 10 MG tablet Take 10 mg by mouth 3 (three) times daily as needed for nausea.     . multivitamin (RENA-VIT) TABS tablet Take 1 tablet by mouth daily.    . ondansetron (ZOFRAN) 8 MG tablet Take 8 mg by mouth every 8 (eight) hours as needed for nausea.     . pantoprazole (PROTONIX) 40 MG tablet Take 1 tablet (40 mg total) by mouth daily. 30 tablet 1  . pravastatin (PRAVACHOL) 80 MG tablet Take 40 mg by mouth at bedtime.     . senna-docusate (SENOKOT-S) 8.6-50 MG tablet 1 tablet daily.     . sevelamer (RENVELA) 800 MG tablet Take 800 mg by mouth 3 (three) times daily with meals.      No current facility-administered medications for this visit.     Review of Systems:  GENERAL: Fatigue.  Weakness.  No fevers, sweats or weight loss. PERFORMANCE STATUS  (ECOG):  2-3 HEENT:  No visual changes, runny nose, sore throat, mouth sores or tenderness. Lungs: Shortness of breath with exertion.  No cough.  No hemoptysis. Cardiac:  No chest pain, palpitations, orthopnea, or PND. GI:  Appetite is 75%.  No nausea, vomiting,  diarrhea, constipation, melena or hematochezia. GU:  Peritoneal dialysis.  Makes some urine.  No urgency, frequency, dysuria, or hematuria. Musculoskeletal:  No back pain.  No joint pain.  No muscle tenderness. Extremities:  No pain or swelling. Skin:  No rashes or skin changes. Neuro:  No headache, numbness or weakness, balance or coordination issues. Endocrine:  Diabetes.  No thyroid issues, hot flashes or night sweats. Psych:  No mood changes, depression or anxiety. Pain:  No focal pain. Review of systems:  All other systems reviewed and found to be negative.  Physical Exam: There were no vitals taken for this visit. GENERAL:  Well developed, well nourished, overweight woman sitting comfortably in the exam room in no acute distress. MENTAL STATUS:  Alert and oriented to person, place and time. HEAD:  Wearing a white cap.  Short gray hair.  Normocephalic, atraumatic, face symmetric, no Cushingoid features. EYES:  Glasses.  Brown eyes.  Pupils equal round and reactive to light and accomodation.  No conjunctivitis or scleral icterus. ENT:  Oropharynx clear without lesion.  Tongue normal. Mucous membranes moist.  RESPIRATORY:  Clear to auscultation without rales, wheezes or rhonchi. CARDIOVASCULAR:  Regular rate and rhythm without murmur, rub or gallop. ABDOMEN:  Peritoneal dialysis catheter.  Soft, non-tender, with active bowel sounds, and no appreciable hepatosplenomegaly.  No masses. SKIN:  No rashes, ulcers or lesions. EXTREMITIES:  Chronic lower extremity edema.  No skin discoloration or tenderness.  No palpable cords. LYMPH NODES: No palpable cervical, supraclavicular, axillary or inguinal adenopathy  NEUROLOGICAL:  Unremarkable. PSYCH:  Appropriate.   No visits with results within 3 Day(s) from this visit.  Latest known visit with results is:  Admission on 07/21/2016, Discharged on 07/22/2016  Component Date Value Ref Range Status  . WBC 07/21/2016 10.7  3.6 - 11.0 K/uL Final  . RBC 07/21/2016 2.88* 3.80 - 5.20 MIL/uL Final  . Hemoglobin 07/21/2016 10.1* 12.0 - 16.0 g/dL Final  . HCT 07/21/2016 29.5* 35.0 - 47.0 % Final  . MCV 07/21/2016 102.4* 80.0 - 100.0 fL Final  . MCH 07/21/2016 35.0* 26.0 - 34.0 pg Final  . MCHC 07/21/2016 34.2  32.0 - 36.0 g/dL Final  . RDW 07/21/2016 21.7* 11.5 - 14.5 % Final  . Platelets 07/21/2016 280  150 - 440 K/uL Final  . Neutrophils Relative % 07/21/2016 72  % Final  . Neutro Abs 07/21/2016 7.7* 1.4 - 6.5 K/uL Final  . Lymphocytes Relative 07/21/2016 16  % Final  . Lymphs Abs 07/21/2016 1.7  1.0 - 3.6 K/uL Final  . Monocytes Relative 07/21/2016 10  % Final  . Monocytes Absolute 07/21/2016 1.0* 0.2 - 0.9 K/uL Final  . Eosinophils Relative 07/21/2016 2  % Final  . Eosinophils Absolute 07/21/2016 0.2  0 - 0.7 K/uL Final  . Basophils Relative 07/21/2016 0  % Final  . Basophils Absolute 07/21/2016 0.0  0 - 0.1 K/uL Final  . Sodium 07/21/2016 136  135 - 145 mmol/L Final  . Potassium 07/21/2016 3.7  3.5 - 5.1 mmol/L Final  . Chloride 07/21/2016 96* 101 - 111 mmol/L Final  . CO2 07/21/2016 27  22 - 32 mmol/L Final  . Glucose, Bld 07/21/2016 151* 65 - 99 mg/dL Final  . BUN 07/21/2016 22* 6 - 20 mg/dL Final  . Creatinine, Ser 07/21/2016 4.83* 0.44 - 1.00 mg/dL Final  . Calcium 07/21/2016 8.5* 8.9 - 10.3 mg/dL Final  . Total Protein 07/21/2016 7.2  6.5 - 8.1 g/dL Final  . Albumin 07/21/2016 2.8*  3.5 - 5.0 g/dL Final  . AST 07/21/2016 51* 15 - 41 U/L Final  . ALT 07/21/2016 49  14 - 54 U/L Final  . Alkaline Phosphatase 07/21/2016 103  38 - 126 U/L Final  . Total Bilirubin 07/21/2016 0.7  0.3 - 1.2 mg/dL Final  . GFR calc non Af Amer 07/21/2016 8* >60 mL/min Final  .  GFR calc Af Amer 07/21/2016 9* >60 mL/min Final   Comment: (NOTE) The eGFR has been calculated using the CKD EPI equation. This calculation has not been validated in all clinical situations. eGFR's persistently <60 mL/min signify possible Chronic Kidney Disease.   . Anion gap 07/21/2016 13  5 - 15 Final  . Troponin I 07/21/2016 0.04* <0.03 ng/mL Final   Comment: CRITICAL RESULT CALLED TO, READ BACK BY AND VERIFIED WITH  TERESA HUDSON AT 2111 07/21/16 SDR   . Specimen Description 07/26/2016 BLOOD LEFT ASSIST CONTROL   Final  . Special Requests 07/26/2016 BOTTLES DRAWN AEROBIC AND ANAEROBIC [place]   Final  . Culture 07/26/2016 NO GROWTH 5 DAYS   Final  . Report Status 07/26/2016 07/26/2016 FINAL   Final  . Specimen Description 07/26/2016 BLOOD RIGHT ASSIST CONTROL   Final  . Special Requests 07/26/2016 BOTTLES DRAWN AEROBIC AND ANAEROBIC [place]   Final  . Culture 07/26/2016 NO GROWTH 5 DAYS   Final  . Report Status 07/26/2016 07/26/2016 FINAL   Final  . Lactic Acid, Venous 07/21/2016 1.1  0.5 - 1.9 mmol/L Final    Assessment:  ARLENE BRICKEL is a 75 y.o. female  with ESRD on peritoneal dialysis x 8 years who was admitted with symptomatic anemia. She has had progressive anemia since 04/2016 requiring transfusion x 2 (05/23/2016 and 06/15/2016).   Work-up revealed the following: ferritin (895), iron studies (elevated post transfusion), B12 (683), folate (28.0), free T4 (0.89), and [place].  Retic 2.1% (inappropriately low).  ANA was positive with a DS-DNA of 177 (0-9).  SPEP revealed no monoclonal proteins.  Immunoglobulins were normal.  She receives Procrit monthly. Diet is good. She denies any hematuria, vaginal bleeding, melena or hematochezia. EGD and colonoscopy in 2013 and a capsule study in 2014 were negative. Stool hemoccult was negative on 05/24/2016 and 06/17/2016.   Symptomatically, she is fatigued.  Hemoglobin has improved from 8.4 to 9.3 without  transfusion.  Plan: 1.  Labs today:  CBC with diff, retic. 2.  Review events of hospitalization.  Review work-up.  Discuss inappropriately low reticulocyte count (ineffective bone marrow).  Iron and vitamin levels good.  Hemoglobin improved today.  Discuss elevated DS-DNA.  Significance unknown. Discuss referral to rheumatology. 2.  Obtain information about Procrit dosing from [facility]. 3.  Follow-up with rheumatology regarding high titer + DS DNA.  Patient previously seen by Dr. Marlowe Sax. 4.  RTC in 1 month for MD assessment and labs (CBC with diff, retic)   Lequita Asal, MD  08/03/2016, 5:20 AM

## 2016-08-08 ENCOUNTER — Ambulatory Visit (INDEPENDENT_AMBULATORY_CARE_PROVIDER_SITE_OTHER): Payer: 59

## 2016-08-08 ENCOUNTER — Ambulatory Visit (INDEPENDENT_AMBULATORY_CARE_PROVIDER_SITE_OTHER): Payer: Medicare Other | Admitting: Vascular Surgery

## 2016-08-08 ENCOUNTER — Encounter (INDEPENDENT_AMBULATORY_CARE_PROVIDER_SITE_OTHER): Payer: Self-pay | Admitting: Vascular Surgery

## 2016-08-08 ENCOUNTER — Other Ambulatory Visit (INDEPENDENT_AMBULATORY_CARE_PROVIDER_SITE_OTHER): Payer: Self-pay | Admitting: Vascular Surgery

## 2016-08-08 VITALS — BP 130/66 | HR 76 | Resp 16 | Ht 67.5 in

## 2016-08-08 DIAGNOSIS — E785 Hyperlipidemia, unspecified: Secondary | ICD-10-CM

## 2016-08-08 DIAGNOSIS — N186 End stage renal disease: Secondary | ICD-10-CM

## 2016-08-08 DIAGNOSIS — Y841 Kidney dialysis as the cause of abnormal reaction of the patient, or of later complication, without mention of misadventure at the time of the procedure: Secondary | ICD-10-CM

## 2016-08-08 DIAGNOSIS — T829XXD Unspecified complication of cardiac and vascular prosthetic device, implant and graft, subsequent encounter: Secondary | ICD-10-CM

## 2016-08-08 DIAGNOSIS — Z992 Dependence on renal dialysis: Principal | ICD-10-CM

## 2016-08-08 DIAGNOSIS — E118 Type 2 diabetes mellitus with unspecified complications: Secondary | ICD-10-CM

## 2016-08-08 NOTE — Progress Notes (Signed)
Subjective:    Patient ID: Laurie Steele, female    DOB: October 18, 1941, 75 y.o.   MRN: [id number] Chief Complaint  Patient presents with  . Follow-up    ultrasound     Patient presents to review vein mapping studies. She had been on peritoneal dialysis, however her PD catheter has not worked for "months". She is currently being maintained by a right IJ permcath. Patient is right hand dominant. The patient underwent left upper extremity vein mapping which was notable for creation of a left brachial-cephalic AV fistula.    Review of Systems  Constitutional: Negative.   HENT: Negative.   Eyes: Negative.   Respiratory: Negative.   Cardiovascular: Negative.   Gastrointestinal: Negative.   Genitourinary:       ESRD  Musculoskeletal: Negative.   Skin: Negative.   Allergic/Immunologic: Negative.   Neurological: Negative.   Hematological: Negative.   Psychiatric/Behavioral: Negative.       Objective:   Physical Exam  Constitutional: She is oriented to person, place, and time. She appears well-developed and well-nourished.  HENT:  Head: Normocephalic and atraumatic.  Eyes: Conjunctivae and EOM are normal. Pupils are equal, round, and reactive to light.  Neck: Normal range of motion.  Cardiovascular: Normal rate, regular rhythm, normal heart sounds and intact distal pulses.   Pulses:      Radial pulses are 2+ on the right side.  Pulmonary/Chest:  Right IJ permcath: Intact without signs of infection noted.   Abdominal: Soft. Normal appearance, normal aorta and bowel sounds are normal.  PD Catheter: Intact. No signs of infection.   Musculoskeletal: Normal range of motion.  Neurological: She is alert and oriented to person, place, and time. She has normal reflexes.  Skin: Skin is warm and dry.  Psychiatric: She has a normal mood and affect. Her behavior is normal. Judgment and thought content normal.   BP 130/66   Pulse 76   Resp 16   Ht 5' 7.5" (1.715 m)   Past Medical  History:  Diagnosis Date  . Chronic anemia    Dr Inez Pilgrim  . GERD (gastroesophageal reflux disease)   . Glaucoma   . Gout   . Hx of colonic polyps   . Hyperlipidemia   . Hypertension   . Lower back pain   . Miscarriage   . 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 Peritoneal dialysis status ([place])   . Renal failure    Dr Holley Raring  . 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 Vaginal delivery    x 4   Social History   Social History  . Marital status: Widowed    Spouse name: N/A  . Number of children: 4  . Years of education: N/A   Occupational History  . retired Retired    [employer] 2003   Social History Main Topics  . Smoking status: Former Smoker    Quit date: 11/05/1990  . Smokeless tobacco: Never Used  . Alcohol use No  . Drug use: No  . Sexual activity: Not on file   Other Topics Concern  . Not on file   Social History Narrative  . No narrative on file   Past Surgical History:  Procedure Laterality Date  . ABDOMINAL HYSTERECTOMY  1983  . BACK SURGERY  6/08   Dr Collier Salina  . [place]  disc  . CHOLECYSTECTOMY    . PERITONEAL CATHETER INSERTION     Family History  Problem Relation Age of Onset  . Heart attack Father   . Hypertension Mother   . Hyperlipidemia Mother   . Coronary artery disease      strong fam hx  . Breast cancer Neg Hx    No Known Allergies     Assessment & Plan:  Patient presents to review vein mapping studies. She had been on peritoneal dialysis, however her PD catheter has not worked for "months". She is currently being maintained by a right IJ permcath. Patient is right hand dominant. The patient underwent left upper extremity vein mapping which was notable for creation of a left brachial-cephalic AV fistula.   1. END STAGE RENAL DISEASE - Stable The patient underwent left upper extremity vein  mapping which was notable for creation of a left brachial-cephalic AV fistula. Procedure, risks and benefits explained to patient. All questions answered. Patient wishes to proceed.   2. Complication from renal dialysis device, subsequent encounter - Stable Patient being maintained by right IJ permcath - continue this. PD catheter is not functional and will be removed when fistula is created.   3. Hyperlipidemia, unspecified hyperlipidemia type - Stable Encouraged good control as its slows the progression of atherosclerotic disease  4. Type 2 diabetes mellitus with complication, unspecified long term insulin use status (HCC) - Stable Encouraged good control as its slows the progression of atherosclerotic disease  Current Outpatient Prescriptions on File Prior to Visit  Medication Sig Dispense Refill  . allopurinol (ZYLOPRIM) 100 MG tablet Take 1 tablet by mouth daily.     Marland Kitchen ALPRAZolam (XANAX) 0.25 MG tablet Take 1 tablet by mouth at bedtime as needed for sleep.     Marland Kitchen amitriptyline (ELAVIL) 25 MG tablet Take 1 tablet by mouth at bedtime as needed (For foot pain).   3  . amLODipine (NORVASC) 10 MG tablet Take 10 mg by mouth daily. Take it mid-day  11  . aspirin EC 81 MG tablet Take 1 tablet (81 mg total) by mouth daily. 30 tablet 1  . azithromycin (ZITHROMAX) 250 MG tablet Take 1 tablet (250 mg total) by mouth daily. 6 each 0  . cinacalcet (SENSIPAR) 90 MG tablet Take 90 mg by mouth daily. With lunch    . colchicine 0.6 MG tablet Take 0.5 tablets (0.3 mg total) by mouth 2 (two) times a week. 4 tablet 2  . cyclobenzaprine (FLEXERIL) 5 MG tablet Take by mouth.    . diclofenac sodium (VOLTAREN) 1 % GEL Apply 2 g four times a day PRN Not a candidate for oral NSAIDS, ESRD on PD    . ferrous sulfate 325 (65 FE) MG tablet Take 1 tablet (325 mg total) by mouth daily with breakfast. 30 tablet 3  . fluticasone (FLONASE) 50 MCG/ACT nasal spray Place 2 sprays into both nostrils daily.     . furosemide  (LASIX) 40 MG tablet Take 1 tablet (40 mg total) by mouth daily. 30 tablet 2  . gabapentin (NEURONTIN) 100 MG capsule Take 1 capsule (100 mg total) by mouth at bedtime. 30 capsule 0  . HYDROcodone-acetaminophen (NORCO/VICODIN) 5-325 MG tablet Take 1 tablet by mouth 2 (two) times daily as needed for moderate pain.     Marland Kitchen insulin aspart (NOVOLOG) 100 UNIT/ML injection Inject 0-5 Units into the skin at bedtime. (Patient taking differently: Inject 0-5 Units into the skin at bedtime. If blood sugar is 70-120, no insulin.  If 121-250 take 2 units, 251-300 take 3 units, 301-350 take 4 units, 351-400 take 5 units. If over 400 call physician.) 10 mL 11  . insulin glargine (LANTUS) 100 UNIT/ML injection Inject 0.42 mLs (42 Units total) into the skin at bedtime. 7 vial 3  . ipratropium (ATROVENT) 0.03 % nasal spray Place 1 spray into both nostrils 3 (three) times daily as needed for rhinitis.    Marland Kitchen lactulose (CHRONULAC) 10 GM/15ML solution Take 15 ml to 30 ml daily as needed for constipation.    Marland Kitchen latanoprost (XALATAN) 0.005 % ophthalmic solution Place 1 drop into both eyes at bedtime.  6  . lidocaine (XYLOCAINE) 5 % ointment Apply 1 application topically 3 (three) times daily as needed.    . Lidocaine, Anorectal, 5 % GEL Apply 1 Application topically 3 (three) times daily.    . metoCLOPramide (REGLAN) 10 MG tablet Take 10 mg by mouth 3 (three) times daily as needed for nausea.     . multivitamin (RENA-VIT) TABS tablet Take 1 tablet by mouth daily.    . ondansetron (ZOFRAN) 8 MG tablet Take 8 mg by mouth every 8 (eight) hours as needed for nausea.     . pantoprazole (PROTONIX) 40 MG tablet Take 1 tablet (40 mg total) by mouth daily. 30 tablet 1  . pravastatin (PRAVACHOL) 80 MG tablet Take 40 mg by mouth at bedtime.     . senna-docusate (SENOKOT-S) 8.6-50 MG tablet 1 tablet daily.     . sevelamer (RENVELA) 800 MG tablet Take 800 mg by mouth 3 (three) times daily with meals.      No current facility-administered  medications on file prior to visit.     There are no Patient Instructions on file for this visit. No Follow-up on file.   KIMBERLY A STEGMAYER, PA-C

## 2016-08-13 ENCOUNTER — Other Ambulatory Visit (INDEPENDENT_AMBULATORY_CARE_PROVIDER_SITE_OTHER): Payer: Self-pay | Admitting: Vascular Surgery

## 2016-08-15 ENCOUNTER — Inpatient Hospital Stay: Payer: Medicare Other

## 2016-08-20 ENCOUNTER — Inpatient Hospital Stay: Admission: RE | Admit: 2016-08-20 | Payer: Medicare Other | Source: Ambulatory Visit

## 2016-08-20 ENCOUNTER — Encounter (INDEPENDENT_AMBULATORY_CARE_PROVIDER_SITE_OTHER): Payer: Self-pay

## 2016-08-23 ENCOUNTER — Inpatient Hospital Stay: Admission: RE | Admit: 2016-08-23 | Payer: Medicare Other | Source: Ambulatory Visit

## 2016-08-23 ENCOUNTER — Telehealth (INDEPENDENT_AMBULATORY_CARE_PROVIDER_SITE_OTHER): Payer: Self-pay

## 2016-08-23 NOTE — Telephone Encounter (Signed)
Erin from pre-admit called stating that patient did not show up for pre-op for the 2nd time and her surgery will be Wednesday 08/29/16

## 2016-08-28 ENCOUNTER — Telehealth (INDEPENDENT_AMBULATORY_CARE_PROVIDER_SITE_OTHER): Payer: Self-pay

## 2016-08-28 NOTE — Telephone Encounter (Signed)
Patient missed her pre-op on 08/23/16 and her surgery is scheduled for 08/29/16. Spoke to Mickel Baas, patient will have to do pre-op today or reschedule her surgery

## 2016-08-29 ENCOUNTER — Encounter: Admission: RE | Payer: Self-pay | Source: Ambulatory Visit

## 2016-08-29 ENCOUNTER — Ambulatory Visit: Admission: RE | Admit: 2016-08-29 | Payer: Medicare Other | Source: Ambulatory Visit | Admitting: Vascular Surgery

## 2016-08-29 SURGERY — ARTERIOVENOUS (AV) FISTULA CREATION
Anesthesia: General

## 2016-09-07 ENCOUNTER — Inpatient Hospital Stay: Payer: Medicare Other

## 2016-09-10 ENCOUNTER — Inpatient Hospital Stay: Payer: Medicare Other | Admitting: Hematology and Oncology

## 2016-09-10 ENCOUNTER — Inpatient Hospital Stay: Payer: Medicare Other

## 2016-09-10 NOTE — Progress Notes (Unsigned)
Laurie Steele day:  09/10/2016  Chief Complaint: Laurie Steele is a 75 y.o. female with ESRD on peritoneal dialysis and anemia who is seen for 2 month assessment.  HPI:   The patient was last seen in the hematology Steele on 07/02/2016 following an admission for symptomatic anemia.  Symptomatically, she was fatigued.  Hemoglobin had improved from 8.4 to 9.3 without transfusion.  She was referred to rheumatology for a high titer + DS DNA.  She was to have followed up in 1 month for assessment.  She was seen by Dr. Marlowe Sax on 08/17/2016.  Additional testing was requested.  She has a follow-up appointment in 6 months (around 02/15/2017).    She was admitted to [facility] from admitted to [facility] from 07/03/2016 - 07/05/2016 with weakness and a community acquired pneumonia.  CBC with diff on 07/21/2016 revealed a hematocrit of 29.5, hemoglobin 10.1, MCV 102.4, platelets 280,000, WBC 10,700 with an ANC of 7700.  Differential was unremakable.  She has a history of ESRD on peritoneal dialysis for 8 years.  She has chronic anemia.   Anemia has slowly progressed since the middle of 2017.  Labs in 12/2015 - 04/2016 revealed a hematocrit 28.2 - 34.8 (hemoglobin 9.4 - 11.4).  On 05/23/2016, hematocrit was 17.9 with a hemoglobin of 6.1.  She received 1 unit of PRBCs.  CBC on 06/11/2016 revealed a hematocrit of 18.9 with a hemoglobin of 6.5.  MCV was 100.1.  CBC on 06/15/2016 revealed a hematocrit of 20.2, hemoglobin 6.8 and MCV 102.5.  She received 2 units of PRBCs.  Labs on 06/16/2016 revealed a ferritin of 895 (11-307), iron saturation 88% (high), and TIBC 167 (low).  Her diet is good.  She eats grilled chicken and tuna.  She avoids lean meat.  She denies pica.  She denies any melena or hematochezia.  She denies any hematuria or vaginal bleeding.  She previously saw Dr. Candace Cruise.  His notes indicate an EGD and colonoscopy in 2013 and a capsule study in 2014  were negative.  Stool hemoccult was negative on 05/24/2016 and 06/17/2016. The patient has seen by Dr. San Jetty, gastroenterologist.  Plan was for EGD/colonoscopy if stool guaiac was positive.  She is followed by Dr. Holley Raring in nephrology.  She receives Procrit once a month through [place].   Symptomatically, she feels tired.  She denies any fever.  Her performance status is poor.  She is only able to feed herself.    Past Medical History:  Diagnosis Date  . Chronic anemia    Dr Inez Pilgrim  . GERD (gastroesophageal reflux disease)   . Glaucoma   . Gout   . Hx of colonic polyps   . Hyperlipidemia   . Hypertension   . Lower back pain   . Miscarriage   . 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 Peritoneal dialysis status ([place])   . Renal failure    Dr Holley Raring  . 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 Vaginal delivery    x 4    Past Surgical History:  Procedure Laterality Date  . ABDOMINAL HYSTERECTOMY  1983  . BACK SURGERY  6/08   Dr Collier Salina  . BACK SURGERY  1987   disc  . CHOLECYSTECTOMY    . PERITONEAL CATHETER INSERTION      Family History  Problem Relation Age of Onset  . Heart attack Father   . Hypertension Mother   . Hyperlipidemia Mother   . Coronary artery disease      strong fam hx  . Breast cancer Neg Hx     Social History:  reports that she quit smoking about 25 years ago. She has never used smokeless tobacco. She reports that she does not drink alcohol or use drugs.  Contact number is [phone].  The patient is accompanied by her son, [name], today.  Allergies: No Known Allergies  Current Medications: Current Outpatient Prescriptions  Medication Sig Dispense Refill  . allopurinol (ZYLOPRIM) 100 MG tablet Take 1 tablet by mouth daily.     Marland Kitchen ALPRAZolam (XANAX) 0.25 MG tablet Take 1 tablet by mouth at bedtime as needed for sleep.      Marland Kitchen amitriptyline (ELAVIL) 25 MG tablet Take 1 tablet by mouth at bedtime as needed (For foot pain).   3  . amLODipine (NORVASC) 10 MG tablet Take 10 mg by mouth daily. Take it mid-day  11  . aspirin EC 81 MG tablet Take 1 tablet (81 mg total) by mouth daily. 30 tablet 1  . azithromycin (ZITHROMAX) 250 MG tablet Take 1 tablet (250 mg total) by mouth daily. 6 each 0  . cinacalcet (SENSIPAR) 90 MG tablet Take 90 mg by mouth daily. With lunch    . colchicine 0.6 MG tablet Take 0.5 tablets (0.3 mg total) by mouth 2 (two) times a week. 4 tablet 2  . cyclobenzaprine (FLEXERIL) 5 MG tablet Take by mouth.    . diclofenac sodium (VOLTAREN) 1 % GEL Apply 2 g four times a day PRN Not a candidate for oral NSAIDS, ESRD on PD    . ferrous sulfate 325 (65 FE) MG tablet Take 1 tablet (325 mg total) by mouth daily with breakfast. 30 tablet 3  . fluticasone (FLONASE) 50 MCG/ACT nasal spray Place 2 sprays into both nostrils daily.     . furosemide (LASIX) 40 MG tablet Take 1 tablet (40 mg total) by mouth daily. 30 tablet 2  . gabapentin (NEURONTIN) 100 MG capsule Take 1 capsule (100 mg total) by mouth at bedtime. 30 capsule 0  . HYDROcodone-acetaminophen (NORCO/VICODIN) 5-325 MG tablet Take 1 tablet by mouth 2 (two) times daily as needed for moderate pain.     Marland Kitchen insulin aspart (NOVOLOG) 100 UNIT/ML injection Inject 0-5 Units into the skin at bedtime. (Patient taking differently: Inject 0-5 Units into the skin at bedtime. If blood sugar is 70-120, no insulin. If 121-250 take 2 units, 251-300 take 3 units, 301-350 take 4 units, 351-400 take 5 units. If over 400 call physician.) 10 mL 11  . insulin glargine (LANTUS) 100 UNIT/ML injection Inject 0.42 mLs (42 Units total) into the skin at bedtime. 7 vial 3  . ipratropium (ATROVENT) 0.03 % nasal spray Place 1 spray into both nostrils 3 (three) times daily as needed for rhinitis.    Marland Kitchen lactulose (CHRONULAC) 10 GM/15ML solution Take 15 ml to 30 ml daily as needed for  constipation.    Marland Kitchen latanoprost (XALATAN) 0.005 % ophthalmic solution Place 1 drop into both eyes at bedtime.  6  . lidocaine (XYLOCAINE) 5 % ointment Apply 1 application topically 3 (three) times daily as needed.    . Lidocaine, Anorectal, 5 % GEL Apply 1 Application topically 3 (three) times daily.    . metoCLOPramide (REGLAN) 10 MG tablet Take 10 mg by mouth 3 (three) times daily as needed  for nausea.     . multivitamin (RENA-VIT) TABS tablet Take 1 tablet by mouth daily.    . ondansetron (ZOFRAN) 8 MG tablet Take 8 mg by mouth every 8 (eight) hours as needed for nausea.     . pantoprazole (PROTONIX) 40 MG tablet Take 1 tablet (40 mg total) by mouth daily. 30 tablet 1  . pravastatin (PRAVACHOL) 80 MG tablet Take 40 mg by mouth at bedtime.     . sevelamer (RENVELA) 800 MG tablet Take 800 mg by mouth 3 (three) times daily with meals.      No current facility-administered medications for this visit.     Review of Systems:  GENERAL: Fatigue.  Weakness.  No fevers, sweats or weight loss. PERFORMANCE STATUS (ECOG):  2-3 HEENT:  No visual changes, runny nose, sore throat, mouth sores or tenderness. Lungs: Shortness of breath with exertion.  No cough.  No hemoptysis. Cardiac:  No chest pain, palpitations, orthopnea, or PND. GI:  Appetite is 75%.  No nausea, vomiting, diarrhea, constipation, melena or hematochezia. GU:  Peritoneal dialysis.  Makes some urine.  No urgency, frequency, dysuria, or hematuria. Musculoskeletal:  No back pain.  No joint pain.  No muscle tenderness. Extremities:  No pain or swelling. Skin:  No rashes or skin changes. Neuro:  No headache, numbness or weakness, balance or coordination issues. Endocrine:  Diabetes.  No thyroid issues, hot flashes or night sweats. Psych:  No mood changes, depression or anxiety. Pain:  No focal pain. Review of systems:  All other systems reviewed and found to be negative.  Physical Exam: There were no vitals taken for this  visit. GENERAL:  Well developed, well nourished, overweight woman sitting comfortably in the exam room in no acute distress. MENTAL STATUS:  Alert and oriented to person, place and time. HEAD:  Wearing a white cap.  Short gray hair.  Normocephalic, atraumatic, face symmetric, no Cushingoid features. EYES:  Glasses.  Brown eyes.  Pupils equal round and reactive to light and accomodation.  No conjunctivitis or scleral icterus. ENT:  Oropharynx clear without lesion.  Tongue normal. Mucous membranes moist.  RESPIRATORY:  Clear to auscultation without rales, wheezes or rhonchi. CARDIOVASCULAR:  Regular rate and rhythm without murmur, rub or gallop. ABDOMEN:  Peritoneal dialysis catheter.  Soft, non-tender, with active bowel sounds, and no appreciable hepatosplenomegaly.  No masses. SKIN:  No rashes, ulcers or lesions. EXTREMITIES:  Chronic lower extremity edema.  No skin discoloration or tenderness.  No palpable cords. LYMPH NODES: No palpable cervical, supraclavicular, axillary or inguinal adenopathy  NEUROLOGICAL: Unremarkable. PSYCH:  Appropriate.   No visits with results within 3 Day(s) from this visit.  Latest known visit with results is:  Admission on 07/21/2016, Discharged on 07/22/2016  Component Date Value Ref Range Status  . WBC 07/21/2016 10.7  3.6 - 11.0 K/uL Final  . RBC 07/21/2016 2.88* 3.80 - 5.20 MIL/uL Final  . Hemoglobin 07/21/2016 10.1* 12.0 - 16.0 g/dL Final  . HCT 07/21/2016 29.5* 35.0 - 47.0 % Final  . MCV 07/21/2016 102.4* 80.0 - 100.0 fL Final  . MCH 07/21/2016 35.0* 26.0 - 34.0 pg Final  . MCHC 07/21/2016 34.2  32.0 - 36.0 g/dL Final  . RDW 07/21/2016 21.7* 11.5 - 14.5 % Final  . Platelets 07/21/2016 280  150 - 440 K/uL Final  . Neutrophils Relative % 07/21/2016 72  % Final  . Neutro Abs 07/21/2016 7.7* 1.4 - 6.5 K/uL Final  . Lymphocytes Relative 07/21/2016 16  % Final  .  Lymphs Abs 07/21/2016 1.7  1.0 - 3.6 K/uL Final  . Monocytes Relative 07/21/2016 10  % Final   . Monocytes Absolute 07/21/2016 1.0* 0.2 - 0.9 K/uL Final  . Eosinophils Relative 07/21/2016 2  % Final  . Eosinophils Absolute 07/21/2016 0.2  0 - 0.7 K/uL Final  . Basophils Relative 07/21/2016 0  % Final  . Basophils Absolute 07/21/2016 0.0  0 - 0.1 K/uL Final  . Sodium 07/21/2016 136  135 - 145 mmol/L Final  . Potassium 07/21/2016 3.7  3.5 - 5.1 mmol/L Final  . Chloride 07/21/2016 96* 101 - 111 mmol/L Final  . CO2 07/21/2016 27  22 - 32 mmol/L Final  . Glucose, Bld 07/21/2016 151* 65 - 99 mg/dL Final  . BUN 07/21/2016 22* 6 - 20 mg/dL Final  . Creatinine, Ser 07/21/2016 4.83* 0.44 - 1.00 mg/dL Final  . Calcium 07/21/2016 8.5* 8.9 - 10.3 mg/dL Final  . Total Protein 07/21/2016 7.2  6.5 - 8.1 g/dL Final  . Albumin 07/21/2016 2.8* 3.5 - 5.0 g/dL Final  . AST 07/21/2016 51* 15 - 41 U/L Final  . ALT 07/21/2016 49  14 - 54 U/L Final  . Alkaline Phosphatase 07/21/2016 103  38 - 126 U/L Final  . Total Bilirubin 07/21/2016 0.7  0.3 - 1.2 mg/dL Final  . GFR calc non Af Amer 07/21/2016 8* >60 mL/min Final  . GFR calc Af Amer 07/21/2016 9* >60 mL/min Final   Comment: (NOTE) The eGFR has been calculated using the CKD EPI equation. This calculation has not been validated in all clinical situations. eGFR's persistently <60 mL/min signify possible Chronic Kidney Disease.   . Anion gap 07/21/2016 13  5 - 15 Final  . Troponin I 07/21/2016 0.04* <0.03 ng/mL Final   Comment: CRITICAL RESULT CALLED TO, READ BACK BY AND VERIFIED WITH  TERESA HUDSON AT 2111 07/21/16 SDR   . Specimen Description 07/26/2016 BLOOD LEFT ASSIST CONTROL   Final  . Special Requests 07/26/2016 BOTTLES DRAWN AEROBIC AND ANAEROBIC [place]   Final  . Culture 07/26/2016 NO GROWTH 5 DAYS   Final  . Report Status 07/26/2016 07/26/2016 FINAL   Final  . Specimen Description 07/26/2016 BLOOD RIGHT ASSIST CONTROL   Final  . Special Requests 07/26/2016 BOTTLES DRAWN AEROBIC AND ANAEROBIC [place]   Final  . Culture  07/26/2016 NO GROWTH 5 DAYS   Final  . Report Status 07/26/2016 07/26/2016 FINAL   Final  . Lactic Acid, Venous 07/21/2016 1.1  0.5 - 1.9 mmol/L Final    Assessment:  BLAINE GUIFFRE is a 75 y.o. female  with ESRD on peritoneal dialysis x 8 years who was admitted with symptomatic anemia. She has had progressive anemia since 04/2016 requiring transfusion x 2 (05/23/2016 and 06/15/2016).   Work-up on 06/16/2016 - 06/18/2016 revealed the following: ferritin (895), iron studies (elevated post transfusion), B12 (683), folate (28.0), free T4 (0.89), and [place].  Retic was 2.1% (inappropriately low).  ANA was positive with a DS-DNA of 177 (0-9).  SPEP revealed no monoclonal protein.  Immunoglobulins were normal.  She receives Procrit monthly. Diet is good. She denies any hematuria, vaginal bleeding, melena or hematochezia. EGD and colonoscopy in 2013 and a capsule study in 2014 were negative. Stool hemoccult was negative on 05/24/2016 and 06/17/2016.   Symptomatically, she is fatigued.  Hemoglobin has improved from 8.4 to 9.3 without transfusion.  Plan: 1.  Review events of hospitalization.  Review work-up.  Discuss inappropriately low reticulocyte count (ineffective bone marrow).  Iron and vitamin levels good.  Hemoglobin improved today.  Discuss elevated DS-DNA.  Significance unknown. Discuss referral to rheumatology. 2.  Obtain information about Procrit dosing from [place]. 3.  Follow-up with rheumatology regarding high titer + DS DNA.  Patient previously seen by Dr. Marlowe Sax. 4.  RTC in 1 month for MD assessment and labs (CBC with diff, retic)   Lequita Asal, MD  09/10/2016, 5:18 AM

## 2016-09-12 ENCOUNTER — Encounter: Payer: Self-pay | Admitting: *Deleted

## 2016-09-13 ENCOUNTER — Telehealth: Payer: Self-pay | Admitting: *Deleted

## 2016-09-13 ENCOUNTER — Encounter (INDEPENDENT_AMBULATORY_CARE_PROVIDER_SITE_OTHER): Payer: Self-pay | Admitting: Vascular Surgery

## 2016-09-13 ENCOUNTER — Ambulatory Visit (INDEPENDENT_AMBULATORY_CARE_PROVIDER_SITE_OTHER): Payer: 59 | Admitting: Vascular Surgery

## 2016-09-13 VITALS — BP 218/140 | HR 55 | Resp 16 | Ht 67.5 in | Wt 195.0 lb

## 2016-09-13 DIAGNOSIS — E782 Mixed hyperlipidemia: Secondary | ICD-10-CM

## 2016-09-13 DIAGNOSIS — I10 Essential (primary) hypertension: Secondary | ICD-10-CM | POA: Diagnosis not present

## 2016-09-13 DIAGNOSIS — N186 End stage renal disease: Secondary | ICD-10-CM

## 2016-09-13 DIAGNOSIS — E118 Type 2 diabetes mellitus with unspecified complications: Secondary | ICD-10-CM

## 2016-09-13 NOTE — Progress Notes (Signed)
[facility] Admission History & Physical  MRN : [id number]  Laurie Steele is a 75 y.o. (May 29, 1941) female who presents with chief complaint of  Chief Complaint  Patient presents with  . Re-evaluation    To discuss surgery  .  History of Present Illness:  The patient is seen for follow up evaluation of dialysis access.  The patient has a history of failed PD accesses.      Current access is via a catheter which is functioning adequately.  There have been no episodes of catheter infection.  The patient denies amaurosis fugax or recent TIA symptoms. There are no recent neurological changes noted. The patient denies claudication symptoms or rest pain symptoms. The patient denies history of DVT, PE or superficial thrombophlebitis. The patient denies recent episodes of angina or shortness of breath.   Vein mapping shows adequate left cephalic vein for fistula with triphasic arterial inflow   Current Meds  Medication Sig  . allopurinol (ZYLOPRIM) 100 MG tablet Take 1 tablet by mouth daily.   Marland Kitchen ALPRAZolam (XANAX) 0.25 MG tablet Take 1 tablet by mouth at bedtime as needed for sleep.   Marland Kitchen amitriptyline (ELAVIL) 25 MG tablet Take 1 tablet by mouth at bedtime as needed (For foot pain).   Marland Kitchen amLODipine (NORVASC) 10 MG tablet Take 10 mg by mouth daily. Take it mid-day  . aspirin EC 81 MG tablet Take 1 tablet (81 mg total) by mouth daily.  Marland Kitchen azithromycin (ZITHROMAX) 250 MG tablet Take 1 tablet (250 mg total) by mouth daily.  . cinacalcet (SENSIPAR) 90 MG tablet Take 90 mg by mouth daily. With lunch  . colchicine 0.6 MG tablet Take 0.5 tablets (0.3 mg total) by mouth 2 (two) times a week.  . cyclobenzaprine (FLEXERIL) 5 MG tablet Take by mouth.  . diclofenac sodium (VOLTAREN) 1 % GEL Apply 2 g four times a day PRN Not a candidate for oral NSAIDS, ESRD on PD  . ferrous sulfate 325 (65 FE) MG tablet Take 1 tablet (325 mg total) by mouth daily with breakfast.  . fluticasone  (FLONASE) 50 MCG/ACT nasal spray Place 2 sprays into both nostrils daily.   . furosemide (LASIX) 40 MG tablet Take 1 tablet (40 mg total) by mouth daily.  Marland Kitchen gabapentin (NEURONTIN) 100 MG capsule Take 1 capsule (100 mg total) by mouth at bedtime.  Marland Kitchen HYDROcodone-acetaminophen (NORCO/VICODIN) 5-325 MG tablet Take 1 tablet by mouth 2 (two) times daily as needed for moderate pain.   Marland Kitchen insulin aspart (NOVOLOG) 100 UNIT/ML injection Inject 0-5 Units into the skin at bedtime. (Patient taking differently: Inject 0-5 Units into the skin at bedtime. If blood sugar is 70-120, no insulin. If 121-250 take 2 units, 251-300 take 3 units, 301-350 take 4 units, 351-400 take 5 units. If over 400 call physician.)  . insulin glargine (LANTUS) 100 UNIT/ML injection Inject 0.42 mLs (42 Units total) into the skin at bedtime.  Marland Kitchen ipratropium (ATROVENT) 0.03 % nasal spray Place 1 spray into both nostrils 3 (three) times daily as needed for rhinitis.  Marland Kitchen lactulose (CHRONULAC) 10 GM/15ML solution Take 15 ml to 30 ml daily as needed for constipation.  Marland Kitchen latanoprost (XALATAN) 0.005 % ophthalmic solution Place 1 drop into both eyes at bedtime.  . lidocaine (XYLOCAINE) 5 % ointment Apply 1 application topically 3 (three) times daily as needed.  . Lidocaine, Anorectal, 5 % GEL Apply 1 Application topically 3 (three) times daily.  . metoCLOPramide (REGLAN) 10 MG tablet Take  10 mg by mouth 3 (three) times daily as needed for nausea.   . multivitamin (RENA-VIT) TABS tablet Take 1 tablet by mouth daily.  . ondansetron (ZOFRAN) 8 MG tablet Take 8 mg by mouth every 8 (eight) hours as needed for nausea.   . pantoprazole (PROTONIX) 40 MG tablet Take 1 tablet (40 mg total) by mouth daily.  . pravastatin (PRAVACHOL) 80 MG tablet Take 40 mg by mouth at bedtime.   . sevelamer (RENVELA) 800 MG tablet Take 800 mg by mouth 3 (three) times daily with meals.     Past Medical History:  Diagnosis Date  . Chronic anemia    Dr Inez Pilgrim  . GERD  (gastroesophageal reflux disease)   . Glaucoma   . Gout   . Hx of colonic polyps   . Hyperlipidemia   . Hypertension   . Lower back pain   . Miscarriage   . 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 Peritoneal dialysis status ([place])   . Renal failure    Dr Holley Raring  . 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 Vaginal delivery    x 4    Past Surgical History:  Procedure Laterality Date  . ABDOMINAL HYSTERECTOMY  1983  . BACK SURGERY  6/08   Dr Collier Salina  . BACK SURGERY  1987   disc  . CHOLECYSTECTOMY    . PERITONEAL CATHETER INSERTION      Social History Social History  Substance Use Topics  . Smoking status: Former Smoker    Quit date: 11/05/1990  . Smokeless tobacco: Never Used  . Alcohol use No    Family History Family History  Problem Relation Age of Onset  . Heart attack Father   . Hypertension Mother   . Hyperlipidemia Mother   . Coronary artery disease      strong fam hx  . Breast cancer Neg Hx   No family history of bleeding/clotting disorders, porphyria or autoimmune disease   Allergies  Allergen Reactions  . Alprazolam Other (See Comments)    Unable to sleep and confusion     REVIEW OF SYSTEMS (Negative unless checked)  Constitutional: [] Weight loss  [] Fever  [] Chills Cardiac: [] Chest pain   [] Chest pressure   [] Palpitations   [] Shortness of breath when laying flat   [] Shortness of breath with exertion. Vascular:  [] Pain in legs with walking   [] Pain in legs at rest  [] History of DVT   [] Phlebitis   [x] Swelling in legs   [] Varicose veins   [] Non-healing ulcers Pulmonary:   [] Uses home oxygen   [] Productive cough   [] Hemoptysis   [] Wheeze  [] COPD   [] Asthma Neurologic:  [] Dizziness   [] Seizures   [] History of stroke   [] History of TIA  [] Aphasia   [] Vissual changes   [] Weakness or numbness in arm   [] Weakness or numbness in  leg Musculoskeletal:   [] Joint swelling   [] Joint pain   [] Low back pain Hematologic:  [] Easy bruising  [] Easy bleeding   [] Hypercoagulable state   [] Anemic Gastrointestinal:  [] Diarrhea   [] Vomiting  [] Gastroesophageal reflux/heartburn   [] Difficulty swallowing. Genitourinary:  [x] Chronic kidney disease   [] Difficult urination  [] Frequent urination   [] Blood in urine Skin:  [] Rashes   [] Ulcers  Psychological:  [] History of anxiety   []  History of major depression.  Physical Examination  Vitals:   09/13/16 1546  BP: (!) 218/140  Pulse: (!) 55  Resp: 16  Weight: 195 lb (88.5 kg)  Height: 5' 7.5" (1.715 m)   Body mass index is 30.09 kg/m. Gen: WD/WN, NAD Head: [place]/AT, No temporalis wasting.  Ear/Nose/Throat: Hearing grossly intact, nares w/o erythema or drainage, poor dentition Eyes: PER, EOMI, sclera nonicteric.  Neck: Supple, no masses.  No bruit or JVD.  Pulmonary:  Good air movement, clear to auscultation bilaterally, no use of accessory muscles.  Cardiac: RRR, normal S1, S2, no Murmurs. Vascular:  Catheter CD&I, 2-3+ edema bilateral lower extremities Vessel Right Left  Radial Palpable Palpable  Ulnar Palpable Palpable  Brachial Palpable Palpable  Carotid Palpable Palpable  Gastrointestinal: soft, non-distended. No guarding/no peritoneal signs. Scars consistent with past PD catheter  Musculoskeletal: M/S 5/5 throughout.  No deformity or atrophy.  Neurologic: CN 2-12 intact. Pain and light touch intact in extremities.  Symmetrical.  Speech is fluent. Motor exam as listed above. Psychiatric: Judgment intact, Mood & affect appropriate for pt's clinical situation. Dermatologic: No rashes or ulcers noted.  No changes consistent with cellulitis. Lymph : No Cervical lymphadenopathy, no lichenification or skin changes of chronic lymphedema.  CBC Lab Results  Component Value Date   WBC 10.7 07/21/2016   HGB 10.1 (L) 07/21/2016   HCT 29.5 (L) 07/21/2016   MCV 102.4 (H) 07/21/2016    PLT 280 07/21/2016    BMET    Component Value Date/Time   NA 136 07/21/2016 2043   NA 142 10/27/2014 0839   K 3.7 07/21/2016 2043   K 3.2 (L) 10/27/2014 0839   CL 96 (L) 07/21/2016 2043   CL 104 10/27/2014 0839   CO2 27 07/21/2016 2043   CO2 29 10/27/2014 0839   GLUCOSE 151 (H) 07/21/2016 2043   GLUCOSE 108 (H) 10/27/2014 0839   BUN 22 (H) 07/21/2016 2043   BUN 44 (H) 10/27/2014 0839   CREATININE 4.83 (H) 07/21/2016 2043   CREATININE 7.75 (H) 10/27/2014 0839   CALCIUM 8.5 (L) 07/21/2016 2043   CALCIUM 7.7 (L) 10/27/2014 0839   GFRNONAA 8 (L) 07/21/2016 2043   GFRNONAA 5 (L) 10/27/2014 0839   GFRNONAA 5 (L) 10/30/2013 0933   GFRAA 9 (L) 07/21/2016 2043   GFRAA 7 (L) 10/27/2014 0839   GFRAA 6 (L) 10/30/2013 0933   CrCl cannot be calculated (Patient's most recent lab result is older than the maximum 21 days allowed.).  COAG Lab Results  Component Value Date   INR 1.07 06/16/2016   INR 1.18 06/15/2016   INR 0.91 05/15/2016    Radiology No results found.  Assessment/Plan 1. END STAGE RENAL DISEASE Recommend:  At this time the patient does not have appropriate extremity access for dialysis  Patient should have a left brachial cephalic fistula created.  The risks, benefits and alternative therapies were reviewed in detail with the patient.  All questions were answered.  The patient agrees to proceed with surgery.    2. Essential hypertension Continue antihypertensive medications as already ordered and reviewed, no changes at this time.  3. Type 2 diabetes mellitus with complication, unspecified long term insulin use status ([place]) Continue hypoglycemic medications as already ordered and reviewed, no changes at this time.  4. Mixed hyperlipidemia Continue statin as ordered and reviewed, no changes at this time    Hortencia Pilar, MD  09/13/2016 9:44 PM

## 2016-09-13 NOTE — Telephone Encounter (Signed)
Patient has failed three scheduled appointments.  Sent discharge letter via certified mail [phone].

## 2016-09-13 NOTE — Progress Notes (Signed)
Patient is in room # 2

## 2016-09-14 ENCOUNTER — Other Ambulatory Visit (INDEPENDENT_AMBULATORY_CARE_PROVIDER_SITE_OTHER): Payer: Self-pay | Admitting: Vascular Surgery

## 2016-09-14 ENCOUNTER — Encounter (INDEPENDENT_AMBULATORY_CARE_PROVIDER_SITE_OTHER): Payer: Self-pay

## 2016-09-17 ENCOUNTER — Other Ambulatory Visit: Payer: Medicare Other

## 2016-09-17 ENCOUNTER — Inpatient Hospital Stay: Admission: RE | Admit: 2016-09-17 | Payer: Medicare Other | Source: Ambulatory Visit

## 2016-09-19 ENCOUNTER — Encounter
Admission: RE | Admit: 2016-09-19 | Discharge: 2016-09-19 | Disposition: A | Discharge status: Home / Self Care | Payer: Medicare Other | Source: Ambulatory Visit | Attending: Vascular Surgery | Admitting: Vascular Surgery

## 2016-09-19 DIAGNOSIS — Z87891 Personal history of nicotine dependence: Secondary | ICD-10-CM | POA: Diagnosis not present

## 2016-09-19 DIAGNOSIS — Z7982 Long term (current) use of aspirin: Secondary | ICD-10-CM | POA: Diagnosis not present

## 2016-09-19 DIAGNOSIS — E1122 Type 2 diabetes mellitus with diabetic chronic kidney disease: Secondary | ICD-10-CM | POA: Diagnosis not present

## 2016-09-19 DIAGNOSIS — Z888 Allergy status to other drugs, medicaments and biological substances status: Secondary | ICD-10-CM | POA: Diagnosis not present

## 2016-09-19 DIAGNOSIS — I120 Hypertensive chronic kidney disease with stage 5 chronic kidney disease or end stage renal disease: Secondary | ICD-10-CM | POA: Diagnosis not present

## 2016-09-19 DIAGNOSIS — D631 Anemia in chronic kidney disease: Secondary | ICD-10-CM | POA: Diagnosis not present

## 2016-09-19 DIAGNOSIS — N186 End stage renal disease: Secondary | ICD-10-CM | POA: Diagnosis not present

## 2016-09-19 DIAGNOSIS — K219 Gastro-esophageal reflux disease without esophagitis: Secondary | ICD-10-CM | POA: Diagnosis not present

## 2016-09-19 LAB — CBC WITH DIFFERENTIAL/PLATELET
BASOS ABS: 0.1 10*3/uL (ref 0–0.1)
Basophils Relative: 1 %
EOS PCT: 3 %
Eosinophils Absolute: 0.3 10*3/uL (ref 0–0.7)
HEMATOCRIT: 32 % — AB (ref 35.0–47.0)
Hemoglobin: 10.5 g/dL — ABNORMAL LOW (ref 12.0–16.0)
LYMPHS ABS: 2.6 10*3/uL (ref 1.0–3.6)
LYMPHS PCT: 26 %
MCH: 34.7 pg — AB (ref 26.0–34.0)
MCHC: 32.8 g/dL (ref 32.0–36.0)
MCV: 105.9 fL — AB (ref 80.0–100.0)
MONO ABS: 0.7 10*3/uL (ref 0.2–0.9)
Monocytes Relative: 7 %
NEUTROS ABS: 6.3 10*3/uL (ref 1.4–6.5)
Neutrophils Relative %: 63 %
PLATELETS: 251 10*3/uL (ref 150–440)
RBC: 3.02 MIL/uL — ABNORMAL LOW (ref 3.80–5.20)
RDW: 14.9 % — AB (ref 11.5–14.5)
WBC: 10 10*3/uL (ref 3.6–11.0)

## 2016-09-19 LAB — BASIC METABOLIC PANEL
ANION GAP: 13 (ref 5–15)
BUN: 28 mg/dL — AB (ref 6–20)
CHLORIDE: 97 mmol/L — AB (ref 101–111)
CO2: 30 mmol/L (ref 22–32)
Calcium: 10.3 mg/dL (ref 8.9–10.3)
Creatinine, Ser: 5.36 mg/dL — ABNORMAL HIGH (ref 0.44–1.00)
GFR calc Af Amer: 8 mL/min — ABNORMAL LOW (ref >60)
GFR calc non Af Amer: 7 mL/min — ABNORMAL LOW (ref >60)
GLUCOSE: 188 mg/dL — AB (ref 65–99)
POTASSIUM: 3 mmol/L — AB (ref 3.5–5.1)
Sodium: 140 mmol/L (ref 135–145)

## 2016-09-19 LAB — PROTIME-INR
INR: 0.92
Prothrombin Time: 12.3 seconds (ref 11.4–15.2)

## 2016-09-19 LAB — SURGICAL PCR SCREEN
MRSA, PCR: NEGATIVE
Staphylococcus aureus: NEGATIVE

## 2016-09-19 LAB — TYPE AND SCREEN
ABO/RH(D): O POS
ANTIBODY IDENTIFICATION: NEGATIVE
ANTIBODY SCREEN: POSITIVE

## 2016-09-19 LAB — APTT: APTT: 28 s (ref 24–36)

## 2016-09-19 NOTE — Patient Instructions (Signed)
  Your procedure is scheduled CL:EXNTZG Nov. 17, 2017. Report to Same Day Surgery. To find out your arrival time please call [phone] between 1PM - 3PM on Thursday Nov. 16, 2017.Marland Kitchen  Remember: Instructions that are not followed completely may result in serious medical risk, up to and including death, or upon the discretion of your surgeon and anesthesiologist your surgery may need to be rescheduled.    _x___ 1. Do not eat food or drink liquids after midnight. No gum chewing or hard candies.     ____ 2. No Alcohol for 24 hours before or after surgery.   ____ 3. Bring all medications with you on the day of surgery if instructed.    __x__ 4. Notify your doctor if there is any change in your medical condition     (cold, fever, infections).    _____ 5. No smoking 24 hours prior to surgery.     Do not wear jewelry, make-up, hairpins, clips or nail polish.  Do not wear lotions, powders, or perfumes.   Do not shave 48 hours prior to surgery. Men may shave face and neck.  Do not bring valuables to the hospital.    [facility] is not responsible for any belongings or valuables.               Contacts, dentures or bridgework may not be worn into surgery.  Leave your suitcase in the car. After surgery it may be brought to your room.  For patients admitted to the hospital, discharge time is determined by your treatment team.   Patients discharged the day of surgery will not be allowed to drive home.    Please read over the following fact sheets that you were given:   [facility] Preparing for Surgery  __x__ Take these medicines the morning of surgery with A SIP OF WATER:    1. metoCLOPramide (REGLAN)   2. pantoprazole (PROTONIX)  3. amLODipine (NORVASC)  ____ Fleet Enema (as directed)   ____ Use CHG Soap as directed on instruction sheet  ____ Use inhalers on the day of surgery and bring to hospital day of surgery  ____ Stop metformin 2 days prior to surgery    ____ Take 1/2 of  usual insulin dose the night before surgery and none on the morning of surgery.   ____ Stop Coumadin/Plavix/aspirin on does not apply.  __x__ Stop Anti-inflammatories such as Advil, Aleve, Ibuprofen, Motrin, Naproxen,  Naprosyn, Goodies powders or aspirin products. OK to take Tylenol.   ____ Stop supplements until after surgery.    ____ Bring C-Pap to the hospital.

## 2016-09-20 MED ORDER — CEFAZOLIN SODIUM-DEXTROSE 2-4 GM/100ML-% IV SOLN
2.0000 g | INTRAVENOUS | Status: AC
  Administered 2016-09-21: 2 g via INTRAVENOUS

## 2016-09-21 ENCOUNTER — Encounter: Payer: Self-pay | Admitting: *Deleted

## 2016-09-21 ENCOUNTER — Ambulatory Visit: Payer: Medicare Other | Admitting: Certified Registered"

## 2016-09-21 ENCOUNTER — Encounter
Admission: RE | Disposition: A | Discharge status: Home / Self Care | Payer: Self-pay | Source: Ambulatory Visit | Attending: Vascular Surgery

## 2016-09-21 ENCOUNTER — Ambulatory Visit
Admission: RE | Admit: 2016-09-21 | Discharge: 2016-09-21 | Disposition: A | Discharge status: Home / Self Care | Payer: Medicare Other | Source: Ambulatory Visit | Attending: Vascular Surgery | Admitting: Vascular Surgery

## 2016-09-21 ENCOUNTER — Emergency Department
Admission: EM | Admit: 2016-09-21 | Discharge: 2016-09-21 | Discharge status: Against Medical Advice | Payer: Medicare Other

## 2016-09-21 DIAGNOSIS — Z87891 Personal history of nicotine dependence: Secondary | ICD-10-CM | POA: Insufficient documentation

## 2016-09-21 DIAGNOSIS — N186 End stage renal disease: Secondary | ICD-10-CM | POA: Insufficient documentation

## 2016-09-21 DIAGNOSIS — Z888 Allergy status to other drugs, medicaments and biological substances status: Secondary | ICD-10-CM | POA: Insufficient documentation

## 2016-09-21 DIAGNOSIS — K219 Gastro-esophageal reflux disease without esophagitis: Secondary | ICD-10-CM | POA: Insufficient documentation

## 2016-09-21 DIAGNOSIS — I120 Hypertensive chronic kidney disease with stage 5 chronic kidney disease or end stage renal disease: Secondary | ICD-10-CM | POA: Diagnosis not present

## 2016-09-21 DIAGNOSIS — E1122 Type 2 diabetes mellitus with diabetic chronic kidney disease: Secondary | ICD-10-CM | POA: Insufficient documentation

## 2016-09-21 DIAGNOSIS — D631 Anemia in chronic kidney disease: Secondary | ICD-10-CM | POA: Insufficient documentation

## 2016-09-21 DIAGNOSIS — Z7982 Long term (current) use of aspirin: Secondary | ICD-10-CM | POA: Insufficient documentation

## 2016-09-21 HISTORY — PX: AV FISTULA PLACEMENT: SHX1204

## 2016-09-21 HISTORY — PX: REMOVAL OF A DIALYSIS CATHETER: SHX6053

## 2016-09-21 LAB — POTASSIUM: POTASSIUM: 3 mmol/L — AB (ref 3.5–5.1)

## 2016-09-21 LAB — ABO/RH: ABO/RH(D): O POS

## 2016-09-21 SURGERY — ARTERIOVENOUS (AV) FISTULA CREATION
Anesthesia: General | Site: Arm Upper | Wound class: Clean

## 2016-09-21 MED ORDER — FENTANYL CITRATE (PF) 100 MCG/2ML IJ SOLN
INTRAMUSCULAR | Status: DC | PRN
  Administered 2016-09-21: 25 ug via INTRAVENOUS
  Administered 2016-09-21: 50 ug via INTRAVENOUS
  Administered 2016-09-21 (×3): 25 ug via INTRAVENOUS

## 2016-09-21 MED ORDER — HEPARIN SODIUM (PORCINE) 5000 UNIT/ML IJ SOLN
INTRAMUSCULAR | Status: AC
  Filled 2016-09-21: qty 1

## 2016-09-21 MED ORDER — HYDROCODONE-ACETAMINOPHEN 5-325 MG PO TABS: 1.0000 | ORAL_TABLET | Freq: Four times a day (QID) | ORAL | 0 refills | Status: DC | PRN

## 2016-09-21 MED ORDER — NEOSTIGMINE METHYLSULFATE 10 MG/10ML IV SOLN
INTRAVENOUS | Status: DC | PRN
  Administered 2016-09-21: 4 mg via INTRAVENOUS

## 2016-09-21 MED ORDER — BUPIVACAINE HCL (PF) 0.5 % IJ SOLN
INTRAMUSCULAR | Status: AC
  Filled 2016-09-21: qty 30

## 2016-09-21 MED ORDER — INSULIN ASPART 100 UNIT/ML ~~LOC~~ SOLN
2.0000 [IU] | Freq: Once | SUBCUTANEOUS | Status: AC
  Administered 2016-09-21: 2 [IU] via SUBCUTANEOUS

## 2016-09-21 MED ORDER — ONDANSETRON HCL 4 MG/2ML IJ SOLN
INTRAMUSCULAR | Status: DC | PRN
  Administered 2016-09-21: 4 mg via INTRAVENOUS

## 2016-09-21 MED ORDER — BUPIVACAINE HCL (PF) 0.5 % IJ SOLN
INTRAMUSCULAR | Status: DC | PRN
  Administered 2016-09-21: 30 mL

## 2016-09-21 MED ORDER — LIDOCAINE HCL (CARDIAC) 20 MG/ML IV SOLN
INTRAVENOUS | Status: DC | PRN
  Administered 2016-09-21: 80 mg via INTRAVENOUS

## 2016-09-21 MED ORDER — PROPOFOL 10 MG/ML IV BOLUS
INTRAVENOUS | Status: DC | PRN
  Administered 2016-09-21 (×2): 40 mg via INTRAVENOUS
  Administered 2016-09-21: 120 mg via INTRAVENOUS

## 2016-09-21 MED ORDER — SODIUM CHLORIDE 0.9 % IV SOLN
INTRAVENOUS | Status: DC | PRN
  Administered 2016-09-21: 10 mL via INTRAMUSCULAR

## 2016-09-21 MED ORDER — BACITRACIN 500 UNIT/GM EX OINT
TOPICAL_OINTMENT | CUTANEOUS | Status: DC | PRN
  Administered 2016-09-21: 1 via TOPICAL

## 2016-09-21 MED ORDER — INSULIN ASPART 100 UNIT/ML ~~LOC~~ SOLN
SUBCUTANEOUS | Status: AC
  Filled 2016-09-21: qty 1

## 2016-09-21 MED ORDER — FENTANYL CITRATE (PF) 100 MCG/2ML IJ SOLN: 25.0000 ug | INTRAMUSCULAR | Status: DC | PRN

## 2016-09-21 MED ORDER — BACITRACIN ZINC 500 UNIT/GM EX OINT
TOPICAL_OINTMENT | CUTANEOUS | Status: AC
  Filled 2016-09-21: qty 0.9

## 2016-09-21 MED ORDER — ONDANSETRON HCL 4 MG/2ML IJ SOLN: 4.0000 mg | Freq: Once | INTRAMUSCULAR | Status: DC | PRN

## 2016-09-21 MED ORDER — CEFAZOLIN SODIUM-DEXTROSE 2-4 GM/100ML-% IV SOLN
INTRAVENOUS | Status: AC
  Filled 2016-09-21: qty 100

## 2016-09-21 MED ORDER — CHLORHEXIDINE GLUCONATE CLOTH 2 % EX PADS: 6.0000 | MEDICATED_PAD | Freq: Once | CUTANEOUS | Status: DC

## 2016-09-21 MED ORDER — SODIUM CHLORIDE 0.9 % IV SOLN
INTRAVENOUS | Status: DC
  Administered 2016-09-21: 07:00:00 via INTRAVENOUS

## 2016-09-21 MED ORDER — GLYCOPYRROLATE 0.2 MG/ML IJ SOLN
INTRAMUSCULAR | Status: DC | PRN
  Administered 2016-09-21: .8 mg via INTRAVENOUS

## 2016-09-21 MED ORDER — ROCURONIUM BROMIDE 100 MG/10ML IV SOLN
INTRAVENOUS | Status: DC | PRN
  Administered 2016-09-21: 30 mg via INTRAVENOUS

## 2016-09-21 MED ORDER — PHENYLEPHRINE HCL 10 MG/ML IJ SOLN
INTRAMUSCULAR | Status: DC | PRN
  Administered 2016-09-21: 50 ug via INTRAVENOUS
  Administered 2016-09-21: 100 ug via INTRAVENOUS
  Administered 2016-09-21: 50 ug via INTRAVENOUS

## 2016-09-21 SURGICAL SUPPLY — 67 items
ADH SKN CLS APL DERMABOND .7 (GAUZE/BANDAGES/DRESSINGS) ×2
APPLIER CLIP 11 MED OPEN (CLIP)
APPLIER CLIP 9.375 SM OPEN (CLIP)
APR CLP MED 11 20 MLT OPN (CLIP)
APR CLP SM 9.3 20 MLT OPN (CLIP)
BAG DECANTER FOR FLEXI CONT (MISCELLANEOUS) ×4 IMPLANT
BLADE SURG 15 STRL LF DISP TIS (BLADE) ×2 IMPLANT
BLADE SURG 15 STRL SS (BLADE) ×4
BLADE SURG SZ11 CARB STEEL (BLADE) ×4 IMPLANT
BOOT SUTURE AID YELLOW STND (SUTURE) ×4 IMPLANT
BRUSH SCRUB 4% CHG (MISCELLANEOUS) ×4 IMPLANT
CANISTER SUCT 1200ML W/VALVE (MISCELLANEOUS) ×4 IMPLANT
CHLORAPREP W/TINT 26ML (MISCELLANEOUS) ×4 IMPLANT
CLIP APPLIE 11 MED OPEN (CLIP) IMPLANT
CLIP APPLIE 9.375 SM OPEN (CLIP) IMPLANT
DERMABOND ADVANCED (GAUZE/BANDAGES/DRESSINGS) ×4
DERMABOND ADVANCED .7 DNX12 (GAUZE/BANDAGES/DRESSINGS) ×4 IMPLANT
DRAPE LAPAROTOMY 100X77 ABD (DRAPES) ×4 IMPLANT
DRESSING SURGICEL FIBRLLR 1X2 (HEMOSTASIS) ×2 IMPLANT
DRSG SURGICEL FIBRILLAR 1X2 (HEMOSTASIS) ×4
ELECT CAUTERY BLADE 6.4 (BLADE) ×4 IMPLANT
ELECT REM PT RETURN 9FT ADLT (ELECTROSURGICAL) ×4
ELECTRODE REM PT RTRN 9FT ADLT (ELECTROSURGICAL) ×2 IMPLANT
GAUZE SPONGE NON-WVN 2X2 STRL (MISCELLANEOUS) ×2 IMPLANT
GEL ULTRASOUND 20GR AQUASONIC (MISCELLANEOUS) IMPLANT
GLOVE BIO SURGEON STRL SZ7 (GLOVE) ×4 IMPLANT
GLOVE INDICATOR 7.5 STRL GRN (GLOVE) ×4 IMPLANT
GLOVE SURG SYN 8.0 (GLOVE) ×4 IMPLANT
GOWN STRL REUS W/ TWL LRG LVL3 (GOWN DISPOSABLE) ×4 IMPLANT
GOWN STRL REUS W/ TWL XL LVL3 (GOWN DISPOSABLE) ×2 IMPLANT
GOWN STRL REUS W/TWL LRG LVL3 (GOWN DISPOSABLE) ×6
GOWN STRL REUS W/TWL XL LVL3 (GOWN DISPOSABLE) ×4
IV NS 500ML (IV SOLUTION) ×4
IV NS 500ML BAXH (IV SOLUTION) ×2 IMPLANT
KIT RM TURNOVER STRD PROC AR (KITS) ×4 IMPLANT
LABEL OR SOLS (LABEL) ×4 IMPLANT
LOOP RED MAXI  1X406MM (MISCELLANEOUS) ×2
LOOP VESSEL MAXI 1X406 RED (MISCELLANEOUS) ×2 IMPLANT
LOOP VESSEL MINI 0.8X406 BLUE (MISCELLANEOUS) ×4 IMPLANT
LOOPS BLUE MINI 0.8X406MM (MISCELLANEOUS) ×4
NEEDLE FILTER BLUNT 18X 1/2SAF (NEEDLE) ×2
NEEDLE FILTER BLUNT 18X1 1/2 (NEEDLE) ×2 IMPLANT
NEEDLE HYPO 25X1 1.5 SAFETY (NEEDLE) IMPLANT
NEEDLE HYPO 30X.5 LL (NEEDLE) IMPLANT
NS IRRIG 500ML POUR BTL (IV SOLUTION) IMPLANT
PACK BASIN MINOR ARMC (MISCELLANEOUS) IMPLANT
PACK EXTREMITY ARMC (MISCELLANEOUS) ×4 IMPLANT
PAD PREP 24X41 OB/GYN DISP (PERSONAL CARE ITEMS) ×4 IMPLANT
PUNCH SURGICAL ROTATE 2.7MM (MISCELLANEOUS) IMPLANT
SPONGE VERSALON 2X2 STRL (MISCELLANEOUS) ×4
STOCKINETTE STRL 4IN 9604848 (GAUZE/BANDAGES/DRESSINGS) ×4 IMPLANT
SUT MNCRL+ 5-0 UNDYED PC-3 (SUTURE) ×2 IMPLANT
SUT MONOCRYL 5-0 (SUTURE) ×2
SUT PROLENE 6 0 BV (SUTURE) ×16 IMPLANT
SUT SILK 2 0 (SUTURE) ×4
SUT SILK 2-0 18XBRD TIE 12 (SUTURE) ×2 IMPLANT
SUT SILK 3 0 (SUTURE) ×3
SUT SILK 3-0 18XBRD TIE 12 (SUTURE) ×2 IMPLANT
SUT SILK 4 0 (SUTURE) ×3
SUT SILK 4-0 18XBRD TIE 12 (SUTURE) ×2 IMPLANT
SUT VIC AB 0 CT2 27 (SUTURE) ×4 IMPLANT
SUT VIC AB 3-0 SH 27 (SUTURE) ×8
SUT VIC AB 3-0 SH 27X BRD (SUTURE) ×4 IMPLANT
SYR 20CC LL (SYRINGE) ×4 IMPLANT
SYR 3ML LL SCALE MARK (SYRINGE) ×4 IMPLANT
SYRINGE 10CC LL (SYRINGE) IMPLANT
TOWEL OR 17X26 4PK STRL BLUE (TOWEL DISPOSABLE) IMPLANT

## 2016-09-21 NOTE — H&P (Signed)
 [facility] History & Physical Update  The patient was interviewed and re-examined.  The patient's previous History and Physical has been reviewed and is unchanged.  There is no change in the plan of care. We plan to proceed with the scheduled procedure.  Hortencia Pilar, MD  09/21/2016, 7:33 AM

## 2016-09-21 NOTE — OR Nursing (Signed)
Lab K+ 3.0 - Dr. Kayleen Memos notified and advises ok to proceed.

## 2016-09-21 NOTE — Anesthesia Procedure Notes (Signed)
Procedure Name: Intubation Performed by: Lance Muss Pre-anesthesia Checklist: Patient identified, Patient being monitored, Timeout performed, Emergency Drugs available and Suction available Patient Re-evaluated:Patient Re-evaluated prior to inductionOxygen Delivery Method: Circle system utilized Preoxygenation: Pre-oxygenation with 100% oxygen Intubation Type: IV induction Ventilation: Mask ventilation without difficulty Laryngoscope Size: Mac and 3 Grade View: Grade II Tube type: Oral Tube size: 7.0 mm Number of attempts: 1 Airway Equipment and Method: Stylet Placement Confirmation: ETT inserted through vocal cords under direct vision,  positive ETCO2 and breath sounds checked- equal and bilateral Secured at: 21 cm Tube secured with: Tape Dental Injury: Teeth and Oropharynx as per pre-operative assessment  Comments: First attempt by CRNA with limited view with MAC 3 blade. MDA viewed with miller 2 blade then back to MAC 3 blade and visualized cords. +ETCO2, +BBS

## 2016-09-21 NOTE — OR Nursing (Signed)
Pt fell pushing rolling walker on arrival to lobby at surgery check desk.  Alert and oriented, taken to ED via [place] by Phillips Grout, RN.  Dr. Delana Meyer paged re same.  Call from ED later advising pt denied complaints and brought back up to [facility].  Preop completed, son present for med review.  Dr. Kayleen Memos in to see and notified re same.  Novolog given per his orders for FSBS.  He is aware re pOCT K+ of 2.9 and that lab serum K+ pending.

## 2016-09-21 NOTE — Anesthesia Preprocedure Evaluation (Signed)
Anesthesia Evaluation  Patient identified by MRN, date of birth, ID band Patient awake    Reviewed: Allergy & Precautions, NPO status , Patient's Chart, lab work & pertinent test results  Airway Mallampati: II       Dental  (+) Partial Lower, Partial Upper   Pulmonary pneumonia, resolved, former smoker,    Pulmonary exam normal        Cardiovascular hypertension, Pt. on medications Normal cardiovascular exam     Neuro/Psych  Neuromuscular disease negative psych ROS   GI/Hepatic Neg liver ROS, GERD  ,  Endo/Other  diabetes  Renal/GU ESRF and DialysisRenal disease  negative genitourinary   Musculoskeletal   Abdominal Normal abdominal exam  (+)   Peds negative pediatric ROS (+)  Hematology  (+) anemia ,   Anesthesia Other Findings   Reproductive/Obstetrics                             Anesthesia Physical Anesthesia Plan  ASA: III  Anesthesia Plan: General   Post-op Pain Management:    Induction: Intravenous  Airway Management Planned: Oral ETT  Additional Equipment:   Intra-op Plan:   Post-operative Plan: Extubation in OR  Informed Consent: I have reviewed the patients History and Physical, chart, labs and discussed the procedure including the risks, benefits and alternatives for the proposed anesthesia with the patient or authorized representative who has indicated his/her understanding and acceptance.   Dental advisory given  Plan Discussed with: CRNA and Surgeon  Anesthesia Plan Comments:         Anesthesia Quick Evaluation

## 2016-09-21 NOTE — OR Nursing (Signed)
Peritoneal dialysis catheter removed from left abdomen by Dr. Delana Meyer.

## 2016-09-21 NOTE — OR Nursing (Signed)
Dr. Delana Meyer notified via tele earlier re pt's fall and preop status, and that 2nd pt called to arrive earlier if needed to go to surgery earlier.

## 2016-09-21 NOTE — ED Notes (Signed)
Pt fell at [facility] was told to come to ED for evaluation but pt has no complaints and states she does not need to be seen by md. Supervisor notified and patient sent back up to [place] for schedule procedure.

## 2016-09-21 NOTE — Transfer of Care (Signed)
Immediate Anesthesia Transfer of Care Note  Patient: Laurie Steele  Procedure(s) Performed: Procedure(s): ARTERIOVENOUS (AV) FISTULA CREATION ( BRACHIOCEPHALIC ) (Left) REMOVAL OF A DIALYSIS CATHETER ( PERITONEAL DIALYSIS CATH ) (N/A)  Patient Location: PACU  Anesthesia Type:General  Level of Consciousness: awake and responds to stimulation  Airway & Oxygen Therapy: Patient Spontanous Breathing and Patient connected to face mask oxygen  Post-op Assessment: Report given to RN and Post -op Vital signs reviewed and stable  Post vital signs: Reviewed and stable  Last Vitals:  Vitals:   09/21/16 0624 09/21/16 1011  BP: (!) 187/78 (!) 167/69  Pulse: 67 (!) 46  Resp: 18 16  Temp: 36.9 C 36.5 C    Last Pain:  Vitals:   09/21/16 0624  TempSrc: Oral  PainSc: 3          Complications: No apparent anesthesia complications

## 2016-09-21 NOTE — OR Nursing (Signed)
FSBS 178 prior to discharge

## 2016-09-21 NOTE — Op Note (Signed)
OPERATIVE NOTE   PROCEDURE: left brachial cephalic arteriovenous fistula placement Removal of peritoneal dialysis catheter  PRE-OPERATIVE DIAGNOSIS: End Stage Renal Disease  POST-OPERATIVE DIAGNOSIS: End Stage Renal Disease  SURGEON: Hortencia Pilar  ASSISTANT(S): Ms. Kinnie Feil  ANESTHESIA: general  ESTIMATED BLOOD LOSS: <50 cc  FINDING(S): 4 mm cephalic vein  SPECIMEN(S):  none  INDICATIONS:   Laurie Steele is a 75 y.o. female who presents with end stage renal disease.  The patient is scheduled for left brachiocephalic arteriovenous fistula placement.  The patient is aware the risks include but are not limited to: bleeding, infection, steal syndrome, nerve damage, ischemic monomelic neuropathy, failure to mature, and need for additional procedures.  The patient is aware of the risks of the procedure and elects to proceed forward.  DESCRIPTION: After full informed written consent was obtained from the patient, the patient was brought back to the operating room and placed supine upon the operating table.  Prior to induction, the patient received IV antibiotics.   After obtaining adequate anesthesia, the patient was then prepped and draped in the standard fashion for a left arm access procedure.    A curvilinear incision was then created midway between the radial impulse and the cephalic vein. The cephalic vein was then identified and dissected circumferentially. It was marked with a surgical marker.    Attention was then turned to the brachial artery which was exposed through the same incision and looped proximally and distally. Side branches were controlled with 4-0 silk ties.  The distal segment of the vein was ligated with a  2-0 silk, and the vein was transected.  The proximal segment was interrogated with serial dilators.  The vein accepted up to a 4 mm dilator without any difficulty. Heparinized saline was infused into the vein and clamped it with a small  bulldog.  At this point, I reset my exposure of the brachial artery and controlled the artery with vessel loops proximally and distally.  An arteriotomy was then made with a #11 blade, and extended with a Potts scissor.  Heparinized saline was injected proximal and distal into the radial artery.  The vein was then approximated to the artery while the artery was in its native bed and subsequently the vein was beveled using Potts scissors. The vein was then sewn to the artery in an end-to-side configuration with a running stitch of 6-0 Prolene.  Prior to completing this anastomosis Flushing maneuvers were performed and the artery was allowed to forward and back bleed.  There was no evidence of clot from any vessels.  I completed the anastomosis in the usual fashion and then released all vessel loops and clamps.    There was good  thrill in the venous outflow, and there was 1+ palpable radial pulse.  At this point, I irrigated out the surgical wound.  There was no further active bleeding.  The subcutaneous tissue was reapproximated with a running stitch of 3-0 Vicryl.  The skin was then reapproximated with a running subcuticular stitch of 4-0 Vicryl.  The skin was then cleaned, dried, and reinforced with Dermabond.    The patient tolerated this procedure well.   The patient was repositioned. The peritoneal dialysis catheter and surrounding area is prepped and draped in a sterile fashion. The cuff was localized.  After appropriate timeout is called, local anesthetic with epinephrine is infiltrated into the surrounding tissues around the cuff in the left lower quadrant. Previous incision is open with an 15 blade  scalpel and the dissection was carried down to expose the cuff of the tunneled catheter.  The catheter is then freed from the surrounding attachments and adhesions. Once the catheter has been freed circumferentially a pursestring suture of 0 Vicryl is placed within the anterior rectus sheath. The catheter  is then removed from the peritoneal cavity and the pursestring suture secured. The second cuff is then localized and dissected free. The catheter is transected just distal to the cuff and subsequently removed in 2 pieces. The lower quadrant incision is then irrigated and closed in layers with Vicryl.. A 4-0 Monocryl was used close the skin. Dermabond is applied to the incision.   Antibiotic ointment and a sterile dressing is applied to the exit site. Patient tolerated procedure well and there were no complications.  COMPLICATIONS: None  CONDITION: Laurie Steele [facility]  Office: [phone]   09/21/2016, 10:02 AM

## 2016-09-21 NOTE — Discharge Instructions (Signed)

## 2016-09-22 NOTE — Anesthesia Postprocedure Evaluation (Signed)
Anesthesia Post Note  Patient: Laurie Steele  Procedure(s) Performed: Procedure(s) (LRB): ARTERIOVENOUS (AV) FISTULA CREATION ( BRACHIOCEPHALIC ) (Left) REMOVAL OF A DIALYSIS CATHETER ( PERITONEAL DIALYSIS CATH ) (N/A)  Patient location during evaluation: PACU Anesthesia Type: General Level of consciousness: awake and alert and oriented Pain management: pain level controlled Vital Signs Assessment: post-procedure vital signs reviewed and stable Respiratory status: spontaneous breathing Cardiovascular status: blood pressure returned to baseline Anesthetic complications: no    Last Vitals:  Vitals:   09/21/16 1237 09/21/16 1305  BP: (!) 192/70 (!) 190/76  Pulse: 69 75  Resp: 18 16  Temp: 36.5 C     Last Pain:  Vitals:   09/21/16 1237  TempSrc: Temporal  PainSc:                  Bobi Daudelin

## 2016-09-24 LAB — POCT I-STAT 4, (NA,K, GLUC, HGB,HCT)
Glucose, Bld: 237 mg/dL — ABNORMAL HIGH (ref 65–99)
HCT: 34 % — ABNORMAL LOW (ref 36.0–46.0)
HEMOGLOBIN: 11.6 g/dL — AB (ref 12.0–15.0)
POTASSIUM: 2.9 mmol/L — AB (ref 3.5–5.1)
SODIUM: 140 mmol/L (ref 135–145)

## 2016-09-24 LAB — GLUCOSE, CAPILLARY
GLUCOSE-CAPILLARY: 221 mg/dL — AB (ref 65–99)
GLUCOSE-CAPILLARY: 178 mg/dL — AB (ref 65–99)
Glucose-Capillary: 189 mg/dL — ABNORMAL HIGH (ref 65–99)

## 2016-09-26 ENCOUNTER — Telehealth: Payer: Self-pay | Admitting: *Deleted

## 2016-09-26 NOTE — Telephone Encounter (Signed)
Received receipt of pt signing for the letter that pt being discharged from practice due to three no shows.

## 2016-10-08 ENCOUNTER — Ambulatory Visit (INDEPENDENT_AMBULATORY_CARE_PROVIDER_SITE_OTHER): Payer: 59 | Admitting: Vascular Surgery

## 2016-10-08 ENCOUNTER — Encounter (INDEPENDENT_AMBULATORY_CARE_PROVIDER_SITE_OTHER): Payer: Self-pay | Admitting: Vascular Surgery

## 2016-10-08 VITALS — BP 169/69 | HR 72 | Resp 16 | Wt 183.6 lb

## 2016-10-08 DIAGNOSIS — N186 End stage renal disease: Secondary | ICD-10-CM

## 2016-10-08 DIAGNOSIS — I10 Essential (primary) hypertension: Secondary | ICD-10-CM

## 2016-10-08 DIAGNOSIS — E118 Type 2 diabetes mellitus with unspecified complications: Secondary | ICD-10-CM

## 2016-10-08 NOTE — Progress Notes (Signed)
Subjective:    Patient ID: Laurie Steele, female    DOB: 05-22-1941, 75 y.o.   MRN: [id number] Chief Complaint  Patient presents with  . Follow-up   Patient presents for her first post-operative visit. She is s\p left brachial cephalic arteriovenous fistula placement with removal of peritoneal dialysis catheter on 09/21/16. She is without complaint. Her post-operative course has been uneventful. She is currently being maintained by a permcath without issue. Extremity and abdominal incisions healing well.    Review of Systems  Constitutional: Negative.   HENT: Negative.   Eyes: Negative.   Respiratory: Negative.   Cardiovascular: Negative.   Gastrointestinal: Negative.   Endocrine: Negative.   Genitourinary:       ESRD  Musculoskeletal: Negative.   Skin: Negative.   Allergic/Immunologic: Negative.   Neurological: Negative.   Hematological: Negative.   Psychiatric/Behavioral: Negative.       Objective:   Physical Exam  Constitutional: She is oriented to person, place, and time. She appears well-developed and well-nourished.  HENT:  Head: Normocephalic and atraumatic.  Right Ear: External ear normal.  Left Ear: External ear normal.  Eyes: Conjunctivae and EOM are normal. Pupils are equal, round, and reactive to light.  Neck: Normal range of motion.  Cardiovascular: Normal rate, regular rhythm, normal heart sounds and intact distal pulses.   Pulses:      Radial pulses are 2+ on the right side, and 2+ on the left side.       Dorsalis pedis pulses are 1+ on the right side, and 1+ on the left side.       Posterior tibial pulses are 1+ on the right side, and 1+ on the left side.  Left Upper Extremity Access: Good bruit and thrill  Pulmonary/Chest: Effort normal and breath sounds normal.  Abdominal: Soft. Bowel sounds are normal.  PD catheter incisions healing well  Musculoskeletal: Normal range of motion. She exhibits edema.  Neurological: She is alert and oriented to  person, place, and time.  Skin: Skin is warm and dry.  Incision healing well  Psychiatric: She has a normal mood and affect. Her behavior is normal. Judgment and thought content normal.   BP (!) 169/69   Pulse 72   Resp 16   Wt 183 lb 9.6 oz (83.3 kg)   BMI 28.33 kg/m   Past Medical History:  Diagnosis Date  . Chronic anemia    Dr Inez Pilgrim  . GERD (gastroesophageal reflux disease)   . Glaucoma   . Gout   . Hx of colonic polyps   . Hyperlipidemia   . Hypertension   . Lower back pain   . Miscarriage   . 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 Peritoneal dialysis status ([place])   . Renal failure    Dr Holley Raring  . 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 Vaginal delivery    x 4   Social History   Social History  . Marital status: Widowed    Spouse name: N/A  . Number of children: 4  . Years of education: N/A   Occupational History  . retired Retired    [employer] 2003   Social History Main Topics  . Smoking status: Former Smoker    Quit date: 11/05/1990  . Smokeless tobacco: Never Used  . Alcohol use No  . Drug use: No  .  Sexual activity: Not on file   Other Topics Concern  . Not on file   Social History Narrative  . No narrative on file   Past Surgical History:  Procedure Laterality Date  . ABDOMINAL HYSTERECTOMY  1983  . AV FISTULA PLACEMENT Left 09/21/2016   Procedure: ARTERIOVENOUS (AV) FISTULA CREATION ( BRACHIOCEPHALIC );  Surgeon: Katha Cabal, MD;  Location: [facility] ORS;  Service: Vascular;  Laterality: Left;  . BACK SURGERY  6/08   Dr Collier Salina  . BACK SURGERY  1987   disc  . CHOLECYSTECTOMY    . INSERTION OF DIALYSIS CATHETER  2017  . PERITONEAL CATHETER INSERTION    . REMOVAL OF A DIALYSIS CATHETER N/A 09/21/2016   Procedure: REMOVAL OF A DIALYSIS CATHETER ( PERITONEAL DIALYSIS CATH );  Surgeon: Katha Cabal, MD;   Location: [facility] ORS;  Service: Vascular;  Laterality: N/A;   Family History  Problem Relation Age of Onset  . Heart attack Father   . Hypertension Mother   . Hyperlipidemia Mother   . Coronary artery disease      strong fam hx  . Breast cancer Neg Hx     Allergies  Allergen Reactions  . Alprazolam Other (See Comments)    Unable to sleep and confusion      Assessment & Plan:  Patient presents for her first post-operative visit. She is s\p left brachial cephalic arteriovenous fistula placement with removal of peritoneal dialysis catheter on 09/21/16. She is without complaint. Her post-operative course has been uneventful. She is currently being maintained by a permcath without issue. Extremity and abdominal incisions healing well.   1. END STAGE RENAL DISEASE s\p left brachial cephalic arteriovenous fistula placement - New Fistula not mature to use yet. Six weeks is normal maturity time. Continue dialysis through permcath. Patient to follow up in one month for HDA at that time will assess fistula to use for dialysis.   2. Type 2 diabetes mellitus with complication, unspecified long term insulin use status ([place]) - Stable Encouraged good control as its slows the progression of atherosclerotic disease  3. Essential hypertension - Stable Encouraged good control as its slows the progression of atherosclerotic disease  Current Outpatient Prescriptions on File Prior to Visit  Medication Sig Dispense Refill  . allopurinol (ZYLOPRIM) 100 MG tablet Take 1 tablet by mouth daily.     Marland Kitchen ALPRAZolam (XANAX) 0.25 MG tablet Take 1 tablet by mouth at bedtime as needed for sleep.     Marland Kitchen amitriptyline (ELAVIL) 25 MG tablet Take 1 tablet by mouth at bedtime as needed (For foot pain).   3  . amLODipine (NORVASC) 10 MG tablet Take 10 mg by mouth daily. Take it mid-day  11  . aspirin EC 81 MG tablet Take 1 tablet (81 mg total) by mouth daily. (Patient taking differently: Take 325 mg by mouth daily. ) 30  tablet 1  . cinacalcet (SENSIPAR) 90 MG tablet Take 90 mg by mouth daily. With lunch    . colchicine 0.6 MG tablet Take 0.5 tablets (0.3 mg total) by mouth 2 (two) times a week. 4 tablet 2  . cyclobenzaprine (FLEXERIL) 5 MG tablet Take by mouth.    . diclofenac sodium (VOLTAREN) 1 % GEL Apply 2 g four times a day PRN Not a candidate for oral NSAIDS, ESRD on PD    . ferrous sulfate 325 (65 FE) MG tablet Take 1 tablet (325 mg total) by mouth daily with breakfast. 30 tablet 3  . fluticasone (  FLONASE) 50 MCG/ACT nasal spray Place 2 sprays into both nostrils daily.     . furosemide (LASIX) 40 MG tablet Take 1 tablet (40 mg total) by mouth daily. 30 tablet 2  . gabapentin (NEURONTIN) 100 MG capsule Take 1 capsule (100 mg total) by mouth at bedtime. 30 capsule 0  . HYDROcodone-acetaminophen (NORCO) 5-325 MG tablet Take 1-2 tablets by mouth every 6 (six) hours as needed for moderate pain or severe pain. 50 tablet 0  . HYDROcodone-acetaminophen (NORCO/VICODIN) 5-325 MG tablet Take 1 tablet by mouth 2 (two) times daily as needed for moderate pain.     Marland Kitchen insulin aspart (NOVOLOG) 100 UNIT/ML injection Inject 0-5 Units into the skin at bedtime. (Patient taking differently: Inject 0-5 Units into the skin at bedtime. If blood sugar is 70-120, no insulin. If 121-250 take 2 units, 251-300 take 3 units, 301-350 take 4 units, 351-400 take 5 units. If over 400 call physician.) 10 mL 11  . insulin glargine (LANTUS) 100 UNIT/ML injection Inject 0.42 mLs (42 Units total) into the skin at bedtime. 7 vial 3  . ipratropium (ATROVENT) 0.03 % nasal spray Place 1 spray into both nostrils 3 (three) times daily as needed for rhinitis.    Marland Kitchen lactulose (CHRONULAC) 10 GM/15ML solution Take 15 ml to 30 ml daily as needed for constipation.    Marland Kitchen latanoprost (XALATAN) 0.005 % ophthalmic solution Place 1 drop into both eyes at bedtime.  6  . lidocaine (XYLOCAINE) 5 % ointment Apply 1 application topically 3 (three) times daily as needed.     . Lidocaine, Anorectal, 5 % GEL Apply 1 Application topically 3 (three) times daily.    . metoCLOPramide (REGLAN) 10 MG tablet Take 10 mg by mouth 3 (three) times daily as needed for nausea.     . multivitamin (RENA-VIT) TABS tablet Take 1 tablet by mouth daily.    . ondansetron (ZOFRAN) 8 MG tablet Take 8 mg by mouth every 8 (eight) hours as needed for nausea.     . pantoprazole (PROTONIX) 40 MG tablet Take 1 tablet (40 mg total) by mouth daily. 30 tablet 1  . pravastatin (PRAVACHOL) 80 MG tablet Take 40 mg by mouth at bedtime.     . sevelamer (RENVELA) 800 MG tablet Take 800 mg by mouth 3 (three) times daily with meals.      No current facility-administered medications on file prior to visit.    There are no Patient Instructions on file for this visit. Return in about 1 month (around 11/08/2016) for HDA.  Denney Shein A Amanpreet Delmont, PA-C

## 2016-11-07 ENCOUNTER — Ambulatory Visit (INDEPENDENT_AMBULATORY_CARE_PROVIDER_SITE_OTHER): Payer: 59 | Admitting: Vascular Surgery

## 2016-11-07 ENCOUNTER — Encounter (INDEPENDENT_AMBULATORY_CARE_PROVIDER_SITE_OTHER): Payer: 59

## 2016-12-05 ENCOUNTER — Ambulatory Visit (INDEPENDENT_AMBULATORY_CARE_PROVIDER_SITE_OTHER): Payer: 59 | Admitting: Vascular Surgery

## 2016-12-05 ENCOUNTER — Encounter (INDEPENDENT_AMBULATORY_CARE_PROVIDER_SITE_OTHER): Payer: Self-pay | Admitting: Vascular Surgery

## 2016-12-05 ENCOUNTER — Ambulatory Visit (INDEPENDENT_AMBULATORY_CARE_PROVIDER_SITE_OTHER): Payer: Medicare Other

## 2016-12-05 VITALS — BP 147/72 | HR 73 | Resp 16 | Wt 183.0 lb

## 2016-12-05 DIAGNOSIS — N186 End stage renal disease: Secondary | ICD-10-CM

## 2016-12-05 DIAGNOSIS — E118 Type 2 diabetes mellitus with unspecified complications: Secondary | ICD-10-CM

## 2016-12-05 DIAGNOSIS — I10 Essential (primary) hypertension: Secondary | ICD-10-CM

## 2016-12-05 NOTE — Progress Notes (Signed)
Subjective:    Patient ID: Laurie Steele, female    DOB: 1941-05-04, 76 y.o.   MRN: [id number] Chief Complaint  Patient presents with  . Follow-up   Patient was last seen on 10/08/16 for her first post-operative visit. She is s\p left brachial cephalic arteriovenous fistula placement with removal of peritoneal dialysis catheter on 09/21/16. She presents today without complaint. Her post-operative course continues to be uneventful. She is currently being maintained by a permcath without issue. The patient underwent a duplex ultrasound of the AV access which was notable for a patent fistula without any significant hemodynamic stenosis. The patient denies any fistula skin breakdown, pain, edema, pallor or ulceration of the arm / hand.    Review of Systems  Constitutional: Negative.   HENT: Negative.   Eyes: Negative.   Respiratory: Negative.   Cardiovascular: Negative.   Gastrointestinal: Negative.   Endocrine: Negative.   Genitourinary:       ESRD  Musculoskeletal: Negative.   Skin: Negative.   Allergic/Immunologic: Negative.   Neurological: Negative.   Hematological: Negative.   Psychiatric/Behavioral: Negative.       Objective:   Physical Exam Constitutional: She is oriented to person, place, and time. She appears well-developed and well-nourished.  HENT:  Head: Normocephalic and atraumatic.  Right Ear: External ear normal.  Left Ear: External ear normal.  Eyes: Conjunctivae and EOM are normal. Pupils are equal, round, and reactive to light.  Neck: Normal range of motion.  Cardiovascular: Normal rate, regular rhythm, normal heart sounds and intact distal pulses.   Pulses:      Radial pulses are 2+ on the right side, and 2+ on the left side.       Dorsalis pedis pulses are 1+ on the right side, and 1+ on the left side.       Posterior tibial pulses are 1+ on the right side, and 1+ on the left side.  Left Upper Extremity Access: Good bruit and thrill  Pulmonary/Chest:  Effort normal and breath sounds normal.  Abdominal: Soft. Bowel sounds are normal.  PD catheter incisions healing well  Musculoskeletal: Normal range of motion. She exhibits edema.  Neurological: She is alert and oriented to person, place, and time.  Skin: Skin is warm and dry.  Incision healing well  Psychiatric: She has a normal mood and affect. Her behavior is normal. Judgment and thought content normal.   BP (!) 147/72   Pulse 73   Resp 16   Wt 183 lb (83 kg)   BMI 28.24 kg/m   Past Medical History:  Diagnosis Date  . Chronic anemia    Dr Inez Pilgrim  . GERD (gastroesophageal reflux disease)   . Glaucoma   . Gout   . Hx of colonic polyps   . Hyperlipidemia   . Hypertension   . Lower back pain   . Miscarriage   . 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 Peritoneal dialysis status ([place])   . Renal failure    Dr Holley Raring  . 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 Vaginal delivery    x 4    Social History   Social History  . Marital status: Widowed    Spouse name: N/A  . Number of children: 4  . Years of education: N/A   Occupational History  . retired Retired    [employer]   Social History Main Topics  . Smoking status: Former Smoker    Quit date: 11/05/1990  . Smokeless tobacco: Never Used  . Alcohol use No  . Drug use: No  . Sexual activity: Not on file   Other Topics Concern  . Not on file   Social History Narrative  . No narrative on file    Past Surgical History:  Procedure Laterality Date  . ABDOMINAL HYSTERECTOMY  1983  . AV FISTULA PLACEMENT Left 09/21/2016   Procedure: ARTERIOVENOUS (AV) FISTULA CREATION ( BRACHIOCEPHALIC );  Surgeon: Katha Cabal, MD;  Location: [facility] ORS;  Service: Vascular;  Laterality: Left;  . BACK SURGERY  6/08   Dr Collier Salina  . BACK SURGERY  1987   disc  . CHOLECYSTECTOMY    . INSERTION  OF DIALYSIS CATHETER  2017  . PERITONEAL CATHETER INSERTION    . REMOVAL OF A DIALYSIS CATHETER N/A 09/21/2016   Procedure: REMOVAL OF A DIALYSIS CATHETER ( PERITONEAL DIALYSIS CATH );  Surgeon: Katha Cabal, MD;  Location: [facility] ORS;  Service: Vascular;  Laterality: N/A;    Family History  Problem Relation Age of Onset  . Heart attack Father   . Hypertension Mother   . Hyperlipidemia Mother   . Coronary artery disease      strong fam hx  . Breast cancer Neg Hx     Allergies  Allergen Reactions  . Alprazolam Other (See Comments)    Unable to sleep and confusion       Assessment & Plan:  Patient was last seen on 10/08/16 for her first post-operative visit. She is s\p left brachial cephalic arteriovenous fistula placement with removal of peritoneal dialysis catheter on 09/21/16. She presents today without complaint. Her post-operative course continues to be uneventful. She is currently being maintained by a permcath without issue. The patient underwent a duplex ultrasound of the AV access which was notable for a patent fistula without any significant hemodynamic stenosis. The patient denies any fistula skin breakdown, pain, edema, pallor or ulceration of the arm / hand.   1. Type 2 diabetes mellitus with complication, unspecified long term insulin use status ([place]) - Stable Encouraged good control as its slows the progression of atherosclerotic disease  2. END STAGE RENAL DISEASE - Improvement Access is now mature and ready to use for dialysis. The patient should continue to have duplex ultrasounds of the dialysis access every six months. The patient was instructed to call the office in the interim if any issues with dialysis access / doppler flow, pain, edema, pallor, fistula skin breakdown or ulceration of the arm / hand occur. The patient expressed their understanding.  3. Essential hypertension - Stable Encouraged good control as its slows the progression of atherosclerotic  disease   Current Outpatient Prescriptions on File Prior to Visit  Medication Sig Dispense Refill  . allopurinol (ZYLOPRIM) 100 MG tablet Take 1 tablet by mouth daily.     Marland Kitchen amLODipine (NORVASC) 10 MG tablet Take 10 mg by mouth daily. Take it mid-day  11  . aspirin EC 81 MG tablet Take 1 tablet (81 mg total) by mouth daily. (Patient taking differently: Take 325 mg by mouth daily. ) 30 tablet 1  . cinacalcet (SENSIPAR) 90 MG tablet Take 90 mg by mouth daily. With lunch    . ferrous sulfate 325 (65 FE) MG tablet Take 1 tablet (325 mg total) by mouth daily with breakfast. 30 tablet 3  . insulin aspart (NOVOLOG)  100 UNIT/ML injection Inject 0-5 Units into the skin at bedtime. (Patient taking differently: Inject 0-5 Units into the skin at bedtime. If blood sugar is 70-120, no insulin. If 121-250 take 2 units, 251-300 take 3 units, 301-350 take 4 units, 351-400 take 5 units. If over 400 call physician.) 10 mL 11  . insulin glargine (LANTUS) 100 UNIT/ML injection Inject 0.42 mLs (42 Units total) into the skin at bedtime. 7 vial 3  . ipratropium (ATROVENT) 0.03 % nasal spray Place 1 spray into both nostrils 3 (three) times daily as needed for rhinitis.    Marland Kitchen lactulose (CHRONULAC) 10 GM/15ML solution Take 15 ml to 30 ml daily as needed for constipation.    Marland Kitchen latanoprost (XALATAN) 0.005 % ophthalmic solution Place 1 drop into both eyes at bedtime.  6  . multivitamin (RENA-VIT) TABS tablet Take 1 tablet by mouth daily.    . pravastatin (PRAVACHOL) 80 MG tablet Take 40 mg by mouth at bedtime.     . ALPRAZolam (XANAX) 0.25 MG tablet Take 1 tablet by mouth at bedtime as needed for sleep.     Marland Kitchen amitriptyline (ELAVIL) 25 MG tablet Take 1 tablet by mouth at bedtime as needed (For foot pain).   3  . colchicine 0.6 MG tablet Take 0.5 tablets (0.3 mg total) by mouth 2 (two) times a week. (Patient not taking: Reported on 12/05/2016) 4 tablet 2  . cyclobenzaprine (FLEXERIL) 5 MG tablet Take by mouth.    . diclofenac  sodium (VOLTAREN) 1 % GEL Apply 2 g four times a day PRN Not a candidate for oral NSAIDS, ESRD on PD    . fluticasone (FLONASE) 50 MCG/ACT nasal spray Place 2 sprays into both nostrils daily.     . furosemide (LASIX) 40 MG tablet Take 1 tablet (40 mg total) by mouth daily. (Patient not taking: Reported on 12/05/2016) 30 tablet 2  . gabapentin (NEURONTIN) 100 MG capsule Take 1 capsule (100 mg total) by mouth at bedtime. (Patient not taking: Reported on 12/05/2016) 30 capsule 0  . HYDROcodone-acetaminophen (NORCO) 5-325 MG tablet Take 1-2 tablets by mouth every 6 (six) hours as needed for moderate pain or severe pain. (Patient not taking: Reported on 12/05/2016) 50 tablet 0  . HYDROcodone-acetaminophen (NORCO/VICODIN) 5-325 MG tablet Take 1 tablet by mouth 2 (two) times daily as needed for moderate pain.     Marland Kitchen lidocaine (XYLOCAINE) 5 % ointment Apply 1 application topically 3 (three) times daily as needed.    . Lidocaine, Anorectal, 5 % GEL Apply 1 Application topically 3 (three) times daily.    . metoCLOPramide (REGLAN) 10 MG tablet Take 10 mg by mouth 3 (three) times daily as needed for nausea.     . ondansetron (ZOFRAN) 8 MG tablet Take 8 mg by mouth every 8 (eight) hours as needed for nausea.     . pantoprazole (PROTONIX) 40 MG tablet Take 1 tablet (40 mg total) by mouth daily. (Patient not taking: Reported on 12/05/2016) 30 tablet 1  . sevelamer (RENVELA) 800 MG tablet Take 800 mg by mouth 3 (three) times daily with meals.      No current facility-administered medications on file prior to visit.     There are no Patient Instructions on file for this visit. No Follow-up on file.   Jaydn Fincher A Erek Kowal, PA-C

## 2016-12-11 ENCOUNTER — Inpatient Hospital Stay
Admission: EM | Admit: 2016-12-11 | Discharge: 2016-12-14 | DRG: 291 | Disposition: A | Discharge status: Home Health | Payer: Medicare Other | Attending: Internal Medicine | Admitting: Internal Medicine

## 2016-12-11 ENCOUNTER — Emergency Department: Payer: Medicare Other

## 2016-12-11 ENCOUNTER — Inpatient Hospital Stay
Admit: 2016-12-11 | Discharge: 2016-12-11 | Disposition: A | Discharge status: Home / Self Care | Payer: Medicare Other | Attending: Internal Medicine | Admitting: Internal Medicine

## 2016-12-11 ENCOUNTER — Encounter: Payer: Self-pay | Admitting: Emergency Medicine

## 2016-12-11 DIAGNOSIS — Z7982 Long term (current) use of aspirin: Secondary | ICD-10-CM

## 2016-12-11 DIAGNOSIS — I509 Heart failure, unspecified: Secondary | ICD-10-CM

## 2016-12-11 DIAGNOSIS — R0602 Shortness of breath: Secondary | ICD-10-CM | POA: Diagnosis present

## 2016-12-11 DIAGNOSIS — E11319 Type 2 diabetes mellitus with unspecified diabetic retinopathy without macular edema: Secondary | ICD-10-CM | POA: Diagnosis present

## 2016-12-11 DIAGNOSIS — N2581 Secondary hyperparathyroidism of renal origin: Secondary | ICD-10-CM | POA: Diagnosis present

## 2016-12-11 DIAGNOSIS — Z87891 Personal history of nicotine dependence: Secondary | ICD-10-CM | POA: Diagnosis not present

## 2016-12-11 DIAGNOSIS — Z9981 Dependence on supplemental oxygen: Secondary | ICD-10-CM

## 2016-12-11 DIAGNOSIS — Z8249 Family history of ischemic heart disease and other diseases of the circulatory system: Secondary | ICD-10-CM

## 2016-12-11 DIAGNOSIS — I132 Hypertensive heart and chronic kidney disease with heart failure and with stage 5 chronic kidney disease, or end stage renal disease: Secondary | ICD-10-CM | POA: Diagnosis present

## 2016-12-11 DIAGNOSIS — N186 End stage renal disease: Secondary | ICD-10-CM | POA: Diagnosis present

## 2016-12-11 DIAGNOSIS — E1122 Type 2 diabetes mellitus with diabetic chronic kidney disease: Secondary | ICD-10-CM | POA: Diagnosis present

## 2016-12-11 DIAGNOSIS — D631 Anemia in chronic kidney disease: Secondary | ICD-10-CM | POA: Diagnosis present

## 2016-12-11 DIAGNOSIS — I25118 Atherosclerotic heart disease of native coronary artery with other forms of angina pectoris: Secondary | ICD-10-CM | POA: Diagnosis present

## 2016-12-11 DIAGNOSIS — Z794 Long term (current) use of insulin: Secondary | ICD-10-CM | POA: Diagnosis not present

## 2016-12-11 DIAGNOSIS — E1140 Type 2 diabetes mellitus with diabetic neuropathy, unspecified: Secondary | ICD-10-CM | POA: Diagnosis present

## 2016-12-11 DIAGNOSIS — M109 Gout, unspecified: Secondary | ICD-10-CM | POA: Diagnosis present

## 2016-12-11 DIAGNOSIS — R079 Chest pain, unspecified: Secondary | ICD-10-CM

## 2016-12-11 DIAGNOSIS — Z992 Dependence on renal dialysis: Secondary | ICD-10-CM | POA: Diagnosis not present

## 2016-12-11 DIAGNOSIS — J9621 Acute and chronic respiratory failure with hypoxia: Secondary | ICD-10-CM | POA: Diagnosis present

## 2016-12-11 DIAGNOSIS — I5043 Acute on chronic combined systolic (congestive) and diastolic (congestive) heart failure: Secondary | ICD-10-CM | POA: Diagnosis present

## 2016-12-11 DIAGNOSIS — K219 Gastro-esophageal reflux disease without esophagitis: Secondary | ICD-10-CM | POA: Diagnosis present

## 2016-12-11 DIAGNOSIS — J449 Chronic obstructive pulmonary disease, unspecified: Secondary | ICD-10-CM | POA: Diagnosis present

## 2016-12-11 DIAGNOSIS — Z79899 Other long term (current) drug therapy: Secondary | ICD-10-CM

## 2016-12-11 DIAGNOSIS — M6281 Muscle weakness (generalized): Secondary | ICD-10-CM

## 2016-12-11 DIAGNOSIS — H409 Unspecified glaucoma: Secondary | ICD-10-CM | POA: Diagnosis present

## 2016-12-11 DIAGNOSIS — Z841 Family history of disorders of kidney and ureter: Secondary | ICD-10-CM | POA: Diagnosis not present

## 2016-12-11 DIAGNOSIS — G8929 Other chronic pain: Secondary | ICD-10-CM | POA: Diagnosis present

## 2016-12-11 DIAGNOSIS — R2681 Unsteadiness on feet: Secondary | ICD-10-CM

## 2016-12-11 DIAGNOSIS — E785 Hyperlipidemia, unspecified: Secondary | ICD-10-CM | POA: Diagnosis present

## 2016-12-11 HISTORY — DX: Atherosclerotic heart disease of native coronary artery without angina pectoris: I25.10

## 2016-12-11 LAB — BASIC METABOLIC PANEL
ANION GAP: 16 — AB (ref 5–15)
BUN: 46 mg/dL — ABNORMAL HIGH (ref 6–20)
CO2: 24 mmol/L (ref 22–32)
Calcium: 9.6 mg/dL (ref 8.9–10.3)
Chloride: 100 mmol/L — ABNORMAL LOW (ref 101–111)
Creatinine, Ser: 7.59 mg/dL — ABNORMAL HIGH (ref 0.44–1.00)
GFR, EST AFRICAN AMERICAN: 5 mL/min — AB (ref >60)
GFR, EST NON AFRICAN AMERICAN: 5 mL/min — AB (ref >60)
Glucose, Bld: 277 mg/dL — ABNORMAL HIGH (ref 65–99)
POTASSIUM: 3.7 mmol/L (ref 3.5–5.1)
SODIUM: 140 mmol/L (ref 135–145)

## 2016-12-11 LAB — CBC
HEMATOCRIT: 31.8 % — AB (ref 35.0–47.0)
HEMOGLOBIN: 10.3 g/dL — AB (ref 12.0–16.0)
MCH: 34.3 pg — ABNORMAL HIGH (ref 26.0–34.0)
MCHC: 32.4 g/dL (ref 32.0–36.0)
MCV: 105.7 fL — ABNORMAL HIGH (ref 80.0–100.0)
Platelets: 226 10*3/uL (ref 150–440)
RBC: 3.01 MIL/uL — ABNORMAL LOW (ref 3.80–5.20)
RDW: 17.2 % — AB (ref 11.5–14.5)
WBC: 10.6 10*3/uL (ref 3.6–11.0)

## 2016-12-11 LAB — TROPONIN I
Troponin I: 0.07 ng/mL (ref <0.03)
Troponin I: 0.18 ng/mL (ref <0.03)
Troponin I: 0.44 ng/mL (ref <0.03)

## 2016-12-11 LAB — GLUCOSE, CAPILLARY: Glucose-Capillary: 151 mg/dL — ABNORMAL HIGH (ref 65–99)

## 2016-12-11 MED ORDER — POLYETHYLENE GLYCOL 3350 17 G PO PACK: 17.0000 g | PACK | Freq: Every day | ORAL | Status: DC | PRN

## 2016-12-11 MED ORDER — INSULIN ASPART 100 UNIT/ML ~~LOC~~ SOLN: 0.0000 [IU] | Freq: Every day | SUBCUTANEOUS | Status: DC

## 2016-12-11 MED ORDER — ALLOPURINOL 100 MG PO TABS
100.0000 mg | ORAL_TABLET | Freq: Every day | ORAL | Status: DC
  Administered 2016-12-11 – 2016-12-14 (×4): 100 mg via ORAL
  Filled 2016-12-11 (×4): qty 1

## 2016-12-11 MED ORDER — FERROUS SULFATE 325 (65 FE) MG PO TABS
325.0000 mg | ORAL_TABLET | Freq: Every day | ORAL | Status: DC
  Administered 2016-12-12 – 2016-12-14 (×3): 325 mg via ORAL
  Filled 2016-12-11 (×3): qty 1

## 2016-12-11 MED ORDER — DEXTROMETHORPHAN POLISTIREX ER 30 MG/5ML PO SUER
30.0000 mg | Freq: Two times a day (BID) | ORAL | Status: DC | PRN
  Filled 2016-12-11: qty 5

## 2016-12-11 MED ORDER — LATANOPROST 0.005 % OP SOLN
1.0000 [drp] | Freq: Every day | OPHTHALMIC | Status: DC
  Administered 2016-12-11 – 2016-12-13 (×3): 1 [drp] via OPHTHALMIC
  Filled 2016-12-11: qty 2.5

## 2016-12-11 MED ORDER — HYDRALAZINE HCL 50 MG PO TABS
50.0000 mg | ORAL_TABLET | Freq: Two times a day (BID) | ORAL | Status: DC
  Administered 2016-12-11 – 2016-12-14 (×6): 50 mg via ORAL
  Filled 2016-12-11 (×6): qty 1

## 2016-12-11 MED ORDER — RENA-VITE PO TABS
1.0000 | ORAL_TABLET | Freq: Every day | ORAL | Status: DC
  Administered 2016-12-11 – 2016-12-14 (×4): 1 via ORAL
  Filled 2016-12-11 (×4): qty 1

## 2016-12-11 MED ORDER — ONDANSETRON HCL 4 MG PO TABS: 4.0000 mg | ORAL_TABLET | Freq: Four times a day (QID) | ORAL | Status: DC | PRN

## 2016-12-11 MED ORDER — FUROSEMIDE 40 MG PO TABS
40.0000 mg | ORAL_TABLET | Freq: Every day | ORAL | Status: DC
  Administered 2016-12-12 – 2016-12-14 (×3): 40 mg via ORAL
  Filled 2016-12-11 (×3): qty 1

## 2016-12-11 MED ORDER — INSULIN GLARGINE 100 UNIT/ML ~~LOC~~ SOLN
10.0000 [IU] | Freq: Every day | SUBCUTANEOUS | Status: DC
  Filled 2016-12-11 (×3): qty 0.1

## 2016-12-11 MED ORDER — HYDRALAZINE HCL 20 MG/ML IJ SOLN: 10.0000 mg | Freq: Four times a day (QID) | INTRAMUSCULAR | Status: DC | PRN

## 2016-12-11 MED ORDER — GABAPENTIN 100 MG PO CAPS
100.0000 mg | ORAL_CAPSULE | Freq: Every day | ORAL | Status: DC
  Administered 2016-12-11 – 2016-12-13 (×3): 100 mg via ORAL
  Filled 2016-12-11 (×3): qty 1

## 2016-12-11 MED ORDER — SEVELAMER CARBONATE 800 MG PO TABS
800.0000 mg | ORAL_TABLET | Freq: Three times a day (TID) | ORAL | Status: DC
Start: 2016-12-11 — End: 2016-12-14
  Administered 2016-12-12 – 2016-12-14 (×8): 800 mg via ORAL
  Filled 2016-12-11 (×8): qty 1

## 2016-12-11 MED ORDER — PANTOPRAZOLE SODIUM 40 MG PO TBEC
40.0000 mg | DELAYED_RELEASE_TABLET | Freq: Every day | ORAL | Status: DC
Start: 2016-12-11 — End: 2016-12-14
  Administered 2016-12-12 – 2016-12-14 (×3): 40 mg via ORAL
  Filled 2016-12-11 (×3): qty 1

## 2016-12-11 MED ORDER — FLUTICASONE PROPIONATE 50 MCG/ACT NA SUSP
2.0000 | Freq: Every day | NASAL | Status: DC
  Administered 2016-12-12 – 2016-12-14 (×3): 2 via NASAL
  Filled 2016-12-11: qty 16

## 2016-12-11 MED ORDER — DOCUSATE SODIUM 100 MG PO CAPS
100.0000 mg | ORAL_CAPSULE | Freq: Two times a day (BID) | ORAL | Status: DC
  Administered 2016-12-11 – 2016-12-14 (×6): 100 mg via ORAL
  Filled 2016-12-11 (×6): qty 1

## 2016-12-11 MED ORDER — GUAIFENESIN ER 600 MG PO TB12: 600.0000 mg | ORAL_TABLET | Freq: Two times a day (BID) | ORAL | Status: DC | PRN

## 2016-12-11 MED ORDER — AMITRIPTYLINE HCL 25 MG PO TABS: 25.0000 mg | ORAL_TABLET | Freq: Every evening | ORAL | Status: DC | PRN

## 2016-12-11 MED ORDER — ASPIRIN EC 325 MG PO TBEC
325.0000 mg | DELAYED_RELEASE_TABLET | Freq: Every day | ORAL | Status: DC
  Administered 2016-12-12 – 2016-12-14 (×3): 325 mg via ORAL
  Filled 2016-12-11 (×3): qty 1

## 2016-12-11 MED ORDER — PRAVASTATIN SODIUM 40 MG PO TABS
40.0000 mg | ORAL_TABLET | Freq: Every day | ORAL | Status: DC
Start: 2016-12-11 — End: 2016-12-14
  Administered 2016-12-11 – 2016-12-13 (×3): 40 mg via ORAL
  Filled 2016-12-11 (×3): qty 1

## 2016-12-11 MED ORDER — HYDROCODONE-ACETAMINOPHEN 5-325 MG PO TABS: 1.0000 | ORAL_TABLET | Freq: Four times a day (QID) | ORAL | Status: DC | PRN

## 2016-12-11 MED ORDER — CINACALCET HCL 30 MG PO TABS
90.0000 mg | ORAL_TABLET | Freq: Every day | ORAL | Status: DC
  Administered 2016-12-11 – 2016-12-14 (×4): 90 mg via ORAL
  Filled 2016-12-11 (×4): qty 3

## 2016-12-11 MED ORDER — ACETAMINOPHEN 325 MG PO TABS: 650.0000 mg | ORAL_TABLET | Freq: Four times a day (QID) | ORAL | Status: DC | PRN

## 2016-12-11 MED ORDER — HEPARIN SODIUM (PORCINE) 5000 UNIT/ML IJ SOLN
5000.0000 [IU] | Freq: Three times a day (TID) | INTRAMUSCULAR | Status: DC
  Administered 2016-12-11 – 2016-12-14 (×8): 5000 [IU] via SUBCUTANEOUS
  Filled 2016-12-11 (×7): qty 1

## 2016-12-11 MED ORDER — ONDANSETRON HCL 4 MG/2ML IJ SOLN
4.0000 mg | Freq: Four times a day (QID) | INTRAMUSCULAR | Status: DC | PRN
  Administered 2016-12-11: 4 mg via INTRAVENOUS
  Filled 2016-12-11: qty 2

## 2016-12-11 MED ORDER — AMLODIPINE BESYLATE 10 MG PO TABS
10.0000 mg | ORAL_TABLET | Freq: Every day | ORAL | Status: DC
  Administered 2016-12-12: 10 mg via ORAL
  Filled 2016-12-11: qty 1

## 2016-12-11 MED ORDER — CLONIDINE HCL 0.1 MG PO TABS
0.1000 mg | ORAL_TABLET | Freq: Two times a day (BID) | ORAL | Status: DC
  Administered 2016-12-11 – 2016-12-14 (×6): 0.1 mg via ORAL
  Filled 2016-12-11 (×6): qty 1

## 2016-12-11 MED ORDER — DM-GUAIFENESIN ER 30-600 MG PO TB12: 1.0000 | ORAL_TABLET | Freq: Two times a day (BID) | ORAL | Status: DC | PRN

## 2016-12-11 MED ORDER — FAMOTIDINE 20 MG PO TABS
20.0000 mg | ORAL_TABLET | Freq: Every day | ORAL | Status: DC
Start: 2016-12-11 — End: 2016-12-14
  Administered 2016-12-12 – 2016-12-14 (×3): 20 mg via ORAL
  Filled 2016-12-11 (×3): qty 1

## 2016-12-11 MED ORDER — INSULIN ASPART 100 UNIT/ML ~~LOC~~ SOLN
0.0000 [IU] | Freq: Three times a day (TID) | SUBCUTANEOUS | Status: DC
  Administered 2016-12-12: 1 [IU] via SUBCUTANEOUS
  Administered 2016-12-12: 5 [IU] via SUBCUTANEOUS
  Administered 2016-12-12 – 2016-12-13 (×3): 1 [IU] via SUBCUTANEOUS
  Administered 2016-12-13: 2 [IU] via SUBCUTANEOUS
  Administered 2016-12-14: 1 [IU] via SUBCUTANEOUS
  Filled 2016-12-11 (×5): qty 1
  Filled 2016-12-11 (×2): qty 2

## 2016-12-11 MED ORDER — ACETAMINOPHEN 650 MG RE SUPP: 650.0000 mg | Freq: Four times a day (QID) | RECTAL | Status: DC | PRN

## 2016-12-11 NOTE — Progress Notes (Signed)
76 yo bf admitted to room 241 via stretcher from ED with chest pain.  No distress on ra.  Cardiac monitor placed on pt and verified with Gerald Stabs, CNA.  Pt denies chest pain at this time.  Lungs diminished lower lobes bil.  Rt chest wall perm cath in place, dressing dry and intact.  Lt fa av fistula with + thrill/bruit present.  SL rt upper arm flushes well.  1+ pitting edema lower legs.  Skin intact and verified with  Crystal, RN.  Oriented to room and surroundings.  POC reviewed with pt.  CB in reach, SR up x 2, bed alarm on.

## 2016-12-11 NOTE — Progress Notes (Signed)
To Dialysis via bed 

## 2016-12-11 NOTE — Progress Notes (Signed)
    HD TX ended  

## 2016-12-11 NOTE — Progress Notes (Signed)
Pre HD  

## 2016-12-11 NOTE — Progress Notes (Signed)
[facility]  ROUNDING NOTE   Subjective:   [name] admitted to [facility] on 12/11/2016 for Shortness of breath [R06.02] Nonspecific chest pain [R07.9]   Seen and examined on hemodialysis. Tolerating treatment well. UF goal of 2 litres.   Objective:  Vital signs in last 24 hours:  Temp:  [98.1 F (36.7 C)-98.3 F (36.8 C)] 98.3 F (36.8 C) (02/06 1605) Pulse Rate:  [89-114] 91 (02/06 1605) Resp:  [17-30] 21 (02/06 1605) BP: (155-178)/(60-86) 155/86 (02/06 1605) SpO2:  [96 %-100 %] 96 % (02/06 1336) Weight:  [81.6 kg (180 lb)] 81.6 kg (180 lb) (02/06 0743)  Weight change:  Filed Weights   12/11/16 0743  Weight: 81.6 kg (180 lb)    Intake/Output: No intake/output data recorded.   Intake/Output this shift:  No intake/output data recorded.  Physical Exam: General: NAD,   Head: Normocephalic, atraumatic. Moist oral mucosal membranes  Eyes: Anicteric, PERRL  Neck: Supple, trachea midline  Lungs:  Clear to auscultation  Heart: Regular rate and rhythm  Abdomen:  Soft, nontender,   Extremities: 1+ peripheral edema.  Neurologic: Nonfocal, moving all four extremities  Skin: No lesions  Access: RIJ permcath    Basic Metabolic Panel:  Recent Labs Lab 12/11/16 0747  NA 140  K 3.7  CL 100*  CO2 24  GLUCOSE 277*  BUN 46*  CREATININE 7.59*  CALCIUM 9.6    Liver Function Tests: No results for input(s): AST, ALT, ALKPHOS, BILITOT, PROT, ALBUMIN in the last 168 hours. No results for input(s): LIPASE, AMYLASE in the last 168 hours. No results for input(s): AMMONIA in the last 168 hours.  CBC:  Recent Labs Lab 12/11/16 0747  WBC 10.6  HGB 10.3*  HCT 31.8*  MCV 105.7*  PLT 226    Cardiac Enzymes:  Recent Labs Lab 12/11/16 0747 12/11/16 1436  TROPONINI 0.07* 0.18*    BNP: Invalid input(s): POCBNP  CBG: No results for input(s): GLUCAP in the last 168 hours.  Microbiology: Results for orders placed or performed during the  hospital encounter of 09/19/16  Surgical pcr screen     Status: None   Collection Time: 09/19/16  1:12 PM  Result Value Ref Range Status   MRSA, PCR NEGATIVE NEGATIVE Final   Staphylococcus aureus NEGATIVE NEGATIVE Final    Comment:        The Xpert SA Assay (FDA approved for NASAL specimens in patients over 33 years of age), is one component of a comprehensive surveillance program.  Test performance has been validated by [facility] for patients greater than or equal to 35 year old. It is not intended to diagnose infection nor to guide or monitor treatment.     Coagulation Studies: No results for input(s): LABPROT, INR in the last 72 hours.  Urinalysis: No results for input(s): COLORURINE, LABSPEC, PHURINE, GLUCOSEU, HGBUR, BILIRUBINUR, KETONESUR, PROTEINUR, UROBILINOGEN, NITRITE, LEUKOCYTESUR in the last 72 hours.  Invalid input(s): APPERANCEUR    Imaging: Dg Chest 2 View  Result Date: 12/11/2016 CLINICAL DATA:  Two weeks of cough. Onset of mid chest pain this morning. History of hypertension, diabetes, and renal failure. Discontinued smoking 30 years ago. EXAM: CHEST  2 VIEW COMPARISON:  PA and lateral chest x-ray of July 21, 2016 FINDINGS: The lungs are well-expanded. The interstitial markings are mildly increased today. There is no alveolar infiltrate. There is a trace of blunting of the posterior costophrenic angles bilaterally which is new. The heart is top-normal in size. The central pulmonary vascularity  is prominent and more conspicuous than in the past. The dual-lumen dialysis catheter is in stable position. There is calcification in the wall of the aortic arch. There is no acute bony abnormality. IMPRESSION: COPD. Mild interstitial edema consistent with CHF with small bilateral pleural effusions blunting the costophrenic angles. No acute pneumonia. Electronically Signed   By: David  Martinique M.D.   On: 12/11/2016 08:11     Medications:    . allopurinol  100 mg  Oral Daily  . amLODipine  10 mg Oral Daily  . aspirin EC  325 mg Oral Daily  . cinacalcet  90 mg Oral Daily  . cloNIDine  0.1 mg Oral BID  . docusate sodium  100 mg Oral BID  . famotidine  20 mg Oral Daily  . [START ON 12/12/2016] ferrous sulfate  325 mg Oral Q breakfast  . fluticasone  2 spray Each Nare Daily  . furosemide  40 mg Oral Daily  . gabapentin  100 mg Oral QHS  . heparin  5,000 Units Subcutaneous Q8H  . hydrALAZINE  50 mg Oral BID  . insulin aspart  0-5 Units Subcutaneous QHS  . insulin aspart  0-9 Units Subcutaneous TID WC  . insulin glargine  10 Units Subcutaneous QHS  . latanoprost  1 drop Both Eyes QHS  . multivitamin  1 tablet Oral Daily  . pantoprazole  40 mg Oral Daily  . pravastatin  40 mg Oral QHS  . sevelamer carbonate  800 mg Oral TID WC   acetaminophen **OR** acetaminophen, amitriptyline, hydrALAZINE, HYDROcodone-acetaminophen, ondansetron **OR** ondansetron (ZOFRAN) IV, polyethylene glycol  Assessment/ Plan:  [name] with ESRD on hemodialysis, diabetes mellitus type II, hypertension, hyperlipidemia, gout, glaucoma, GERD, anemia, coronary artery disease  MWF [facility] [address] RIJ permcath.   1. End Stage Renal Disease: seen and examined on hemodialysis. UF of 2 litres.  Continue MWF schedule.   2. Hypertension: elevated. Volume driven.  - UF with dialysis.  - amlodipine, clonidine, furosemide, hydralazine  3. Secondary Hyperparathyroidism - cinacalcet - sevelamer with meals  4. Anemia of chronic kidney disease: hemoglobin 10.3   LOS: 0 [place] 2/6/20184:33 PM

## 2016-12-11 NOTE — ED Notes (Signed)
Attempted to call report x 1  

## 2016-12-11 NOTE — Progress Notes (Signed)
This note also relates to the following rows which could not be included: BP - Cannot attach notes to unvalidated device data  Post hd vitals

## 2016-12-11 NOTE — ED Triage Notes (Addendum)
Pt c/o mid chest pain that woke her out of her sleep at 4 am.  Has had a cough and some SHOB. Respirations unlabored at this time. Pt c/o chest pain even when not coughing. Is a dialysis pt, (T, TH, SA); got last treatment.

## 2016-12-11 NOTE — ED Notes (Signed)
Troponin 0.07 called from lab, Dr.Kinner notified

## 2016-12-11 NOTE — H&P (Signed)
[facility] NAME: Laurie Steele    MR#:  [id number]  DATE OF BIRTH:  07-Aug-1941  DATE OF ADMISSION:  12/11/2016  PRIMARY CARE PHYSICIAN: [name], MD   REQUESTING/REFERRING PHYSICIAN: Dr. Lavonia Drafts  CHIEF COMPLAINT:   Chief Complaint  Patient presents with  . Chest Pain    HISTORY OF PRESENT ILLNESS:  Laurie Steele  is a 76 y.o. female with a known history of CAD and medical management, hypertension, back surgeries with chronic back pain, chronic anemia, end-stage renal disease on Tuesday Thursday Saturday hemodialysis, insulin-dependent diabetes mellitus, retinopathy presents to Hospital from home secondary to worsening shortness of breath, cough and chest pain. Patient says she's had the cough for at least 2-3 days now associated with some dyspnea. This morning she was due to go to her dialysis, all of a sudden felt a heavy pain in her chest and Xiphi-sternal area. Associated with nausea and diaphoresis. She hasn't had chest pain lately. She has had back pain and has been following with cardiology in the past. Rates her pain about 5/10 at this time. Did not improve with nitroglycerin. Troponin slightly elevated at 0.07. Patient is being admitted for CHF exacerbation and chest pain.  PAST MEDICAL HISTORY:   Past Medical History:  Diagnosis Date  . CAD (coronary artery disease)   . Chronic anemia    Dr Inez Pilgrim  . GERD (gastroesophageal reflux disease)   . Glaucoma   . Gout   . Hx of colonic polyps   . Hyperlipidemia   . Hypertension   . Lower back pain   . Miscarriage   . 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 Peritoneal dialysis status ([place])   . Renal failure    Dr Holley Raring  . 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 Vaginal delivery    x 4    PAST SURGICAL HISTORY:   Past Surgical  History:  Procedure Laterality Date  . ABDOMINAL HYSTERECTOMY  1983  . AV FISTULA PLACEMENT Left 09/21/2016   Procedure: ARTERIOVENOUS (AV) FISTULA CREATION ( BRACHIOCEPHALIC );  Surgeon: Katha Cabal, MD;  Location: [facility] ORS;  Service: Vascular;  Laterality: Left;  . BACK SURGERY  6/08   Dr Collier Salina  . BACK SURGERY  1987   disc  . CHOLECYSTECTOMY    . INSERTION OF DIALYSIS CATHETER  2017  . PERITONEAL CATHETER INSERTION    . REMOVAL OF A DIALYSIS CATHETER N/A 09/21/2016   Procedure: REMOVAL OF A DIALYSIS CATHETER ( PERITONEAL DIALYSIS CATH );  Surgeon: Katha Cabal, MD;  Location: [facility] ORS;  Service: Vascular;  Laterality: N/A;    SOCIAL HISTORY:   Social History  Substance Use Topics  . Smoking status: Former Smoker    Quit date: 11/05/1990  . Smokeless tobacco: Never Used  . Alcohol use No    FAMILY HISTORY:   Family History  Problem Relation Age of Onset  . Heart attack Father   . Hypertension Mother   . Hyperlipidemia Mother   . Coronary artery disease      strong fam hx  . Kidney failure Brother   . Breast cancer Neg Hx     DRUG ALLERGIES:   Allergies  Allergen Reactions  . Alprazolam Other (See Comments)    Unable to sleep and confusion    REVIEW  OF SYSTEMS:   Review of Systems  Constitutional: Positive for chills and malaise/fatigue. Negative for fever and weight loss.  HENT: Negative for ear discharge, hearing loss and nosebleeds.   Eyes: Negative for blurred vision, double vision and photophobia.  Respiratory: Positive for cough and shortness of breath. Negative for hemoptysis and wheezing.   Cardiovascular: Positive for chest pain and leg swelling. Negative for palpitations and orthopnea.  Gastrointestinal: Positive for nausea. Negative for abdominal pain, constipation, diarrhea, heartburn, melena and vomiting.  Genitourinary: Negative for dysuria.  Musculoskeletal: Negative for myalgias and neck pain.  Skin: Negative for rash.    Neurological: Negative for dizziness, sensory change, speech change, focal weakness and headaches.  Endo/Heme/Allergies: Does not bruise/bleed easily.  Psychiatric/Behavioral: Negative for depression.    MEDICATIONS AT HOME:   Prior to Admission medications   Medication Sig Start Date End Date Taking? Authorizing Provider  acetaminophen (TYLENOL) 650 MG CR tablet Take 650 mg by mouth every 8 (eight) hours as needed for pain.   Yes Historical Provider, MD  allopurinol (ZYLOPRIM) 100 MG tablet Take 1 tablet by mouth daily.    Yes Historical Provider, MD  amitriptyline (ELAVIL) 25 MG tablet Take 1 tablet by mouth at bedtime as needed (For foot pain).  01/02/16  Yes Historical Provider, MD  amLODipine (NORVASC) 10 MG tablet Take 10 mg by mouth daily. Take it mid-day 03/20/16  Yes Historical Provider, MD  aspirin EC 81 MG tablet Take 1 tablet (81 mg total) by mouth daily. Patient taking differently: Take 325 mg by mouth daily.  06/18/16  Yes Fritzi Mandes, MD  cinacalcet (SENSIPAR) 90 MG tablet Take 90 mg by mouth daily. With lunch   Yes Historical Provider, MD  cloNIDine (CATAPRES) 0.1 MG tablet Take 0.1 mg by mouth 2 (two) times daily. 11/08/16  Yes Historical Provider, MD  colchicine 0.6 MG tablet Take 0.5 tablets (0.3 mg total) by mouth 2 (two) times a week. 07/09/16  Yes Demetrios Loll, MD  cyclobenzaprine (FLEXERIL) 5 MG tablet Take by mouth.   Yes Historical Provider, MD  diclofenac sodium (VOLTAREN) 1 % GEL Apply 2 g four times a day PRN Not a candidate for oral NSAIDS, ESRD on PD 06/27/16  Yes Historical Provider, MD  famotidine (PEPCID) 20 MG tablet  11/29/16  Yes Historical Provider, MD  ferrous sulfate 325 (65 FE) MG tablet Take 1 tablet (325 mg total) by mouth daily with breakfast. 06/18/16  Yes Fritzi Mandes, MD  fluticasone (FLONASE) 50 MCG/ACT nasal spray Place 2 sprays into both nostrils daily.    Yes Historical Provider, MD  furosemide (LASIX) 40 MG tablet Take 1 tablet (40 mg total) by mouth daily.  07/05/16  Yes Demetrios Loll, MD  gabapentin (NEURONTIN) 100 MG capsule Take 1 capsule (100 mg total) by mouth at bedtime. 07/05/16  Yes Demetrios Loll, MD  hydrALAZINE (APRESOLINE) 50 MG tablet Take 50 mg by mouth 2 (two) times daily.  11/29/16  Yes Historical Provider, MD  insulin aspart (NOVOLOG) 100 UNIT/ML injection Inject 0-5 Units into the skin at bedtime. Patient taking differently: Inject 0-5 Units into the skin at bedtime. If blood sugar is 70-120, no insulin. If 121-250 take 2 units, 251-300 take 3 units, 301-350 take 4 units, 351-400 take 5 units. If over 400 call physician. 05/28/16  Yes Fritzi Mandes, MD  ipratropium (ATROVENT) 0.03 % nasal spray Place 1 spray into both nostrils 3 (three) times daily as needed for rhinitis.   Yes Historical Provider, MD  multivitamin (RENA-VIT)  TABS tablet Take 1 tablet by mouth daily.   Yes Historical Provider, MD  pantoprazole (PROTONIX) 40 MG tablet Take 1 tablet (40 mg total) by mouth daily. 06/18/16  Yes Fritzi Mandes, MD  pravastatin (PRAVACHOL) 80 MG tablet Take 40 mg by mouth at bedtime.  06/21/15  Yes Historical Provider, MD  sevelamer (RENVELA) 800 MG tablet Take 800 mg by mouth 3 (three) times daily with meals.    Yes Historical Provider, MD  HYDROcodone-acetaminophen (NORCO) 5-325 MG tablet Take 1-2 tablets by mouth every 6 (six) hours as needed for moderate pain or severe pain. Patient not taking: Reported on 12/05/2016 09/21/16   Katha Cabal, MD  insulin glargine (LANTUS) 100 UNIT/ML injection Inject 0.42 mLs (42 Units total) into the skin at bedtime. Patient not taking: Reported on 12/11/2016 05/28/16   Fritzi Mandes, MD  latanoprost (XALATAN) 0.005 % ophthalmic solution Place 1 drop into both eyes at bedtime. 03/20/16   Historical Provider, MD      VITAL SIGNS:  Blood pressure (!) 160/60, pulse 90, temperature 98.1 F (36.7 C), temperature source Oral, resp. rate (!) 30, height 5\' 7"  (1.702 m), weight 81.6 kg (180 lb), SpO2 100 %.  PHYSICAL EXAMINATION:     Physical Exam  GENERAL:  76 y.o.-year-old patient lying in the bed with no acute distress. Appears very weak. EYES: Pupils equal, round, reactive to light and accommodation. No scleral icterus. Extraocular muscles intact.  HEENT: Head atraumatic, normocephalic. Oropharynx and nasopharynx clear.  NECK:  Supple, no jugular venous distention. No thyroid enlargement, no tenderness.  LUNGS: Normal breath sounds bilaterally, no wheezing, rales,rhonchi or crepitation. No use of accessory muscles of respiration. Bibasilar crackles heard. CARDIOVASCULAR: S1, S2 normal. No rubs, or gallops. 2/6 systolic murmur present.  Right chest permacath noted. ABDOMEN: Soft, nontender, nondistended. Bowel sounds present. No organomegaly or mass.  EXTREMITIES: No cyanosis, or clubbing. 2+ bilateral pedal edema noted. Left arm fistula noted. NEUROLOGIC: Cranial nerves II through XII are intact. Muscle strength 5/5 in all extremities. Sensation intact. Gait not checked. Global weakness noted. PSYCHIATRIC: The patient is alert and oriented x 3.  SKIN: No obvious rash, lesion, or ulcer.   LABORATORY PANEL:   CBC  Recent Labs Lab 12/11/16 0747  WBC 10.6  HGB 10.3*  HCT 31.8*  PLT 226   ------------------------------------------------------------------------------------------------------------------  Chemistries   Recent Labs Lab 12/11/16 0747  NA 140  K 3.7  CL 100*  CO2 24  GLUCOSE 277*  BUN 46*  CREATININE 7.59*  CALCIUM 9.6   ------------------------------------------------------------------------------------------------------------------  Cardiac Enzymes  Recent Labs Lab 12/11/16 0747  TROPONINI 0.07*   ------------------------------------------------------------------------------------------------------------------  RADIOLOGY:  Dg Chest 2 View  Result Date: 12/11/2016 CLINICAL DATA:  Two weeks of cough. Onset of mid chest pain this morning. History of hypertension, diabetes, and  renal failure. Discontinued smoking 30 years ago. EXAM: CHEST  2 VIEW COMPARISON:  PA and lateral chest x-ray of July 21, 2016 FINDINGS: The lungs are well-expanded. The interstitial markings are mildly increased today. There is no alveolar infiltrate. There is a trace of blunting of the posterior costophrenic angles bilaterally which is new. The heart is top-normal in size. The central pulmonary vascularity is prominent and more conspicuous than in the past. The dual-lumen dialysis catheter is in stable position. There is calcification in the wall of the aortic arch. There is no acute bony abnormality. IMPRESSION: COPD. Mild interstitial edema consistent with CHF with small bilateral pleural effusions blunting the costophrenic angles. No acute pneumonia. Electronically  Signed   By: David  Martinique M.D.   On: 12/11/2016 08:11    EKG:   Orders placed or performed during the hospital encounter of 12/11/16  . ED EKG within 10 minutes  . ED EKG within 10 minutes  . EKG 12-Lead  . EKG 12-Lead    IMPRESSION AND PLAN:   Makhayla Mcmurry  is a 76 y.o. female with a known history of CAD and medical management, hypertension, back surgeries with chronic back pain, chronic anemia, end-stage renal disease on Tuesday Thursday Saturday hemodialysis, insulin-dependent diabetes mellitus, retinopathy presents to Hospital from home secondary to worsening shortness of breath, cough and chest pain.  #1 Acute on chronic diastolic CHF exacerbation- complains of dyspnea and cough CXR with pulm edema - due to dialysis today- nephrology consulted - F/u ECHO - last ECHO done as outpatient Sept 2017- EF 70% and LVH noted - continue lasix  #2 Chest pain- typical chest pain, likely stable angina - consult cardiology, recycle troponins - Possible myoview in AM- will await cards input - continue cardiac medications - f/u ECHO  #3 HTN- continue clonidine, hydralazine, Lasix and Norvasc  #4 ESRD-Thursday Saturday  dialysis schedule. Nephrology has been consulted. Due for dialysis today. -continue renal supplements  #5 DM- unknown dose of Lantus at home. Started 10 units, sliding scale insulin and check A1c  #6 DVT prophylaxis-on subcutaneous heparin   Physical therapy consulted.   All the records are reviewed and case discussed with ED provider. Management plans discussed with the patient, family and they are in agreement.  CODE STATUS: Full code  TOTAL TIME TAKING CARE OF THIS PATIENT: 50 minutes.    Journee Bobrowski M.D on 12/11/2016 at 12:29 PM  Between 7am to 6pm - Pager - [phone]  After 6pm go to [email or web address] - password [email or web address] [facility]  Office  [phone]  CC: Primary care physician; [name], MD

## 2016-12-11 NOTE — Plan of Care (Signed)
Problem: Safety: Goal: Ability to remain free from injury will improve Outcome: Progressing Fall precautions in place  Problem: Pain Managment: Goal: General experience of comfort will improve Outcome: Progressing Prn medication  Problem: Tissue Perfusion: Goal: Risk factors for ineffective tissue perfusion will decrease Outcome: Progressing SQ Heparin  Problem: Cardiac: Goal: Ability to achieve and maintain adequate cardiovascular perfusion will improve Outcome: Progressing Cycling troponins, cardiac consult pending

## 2016-12-11 NOTE — ED Notes (Signed)
Blue top was drawn was sent to lab with save label per triage RN bc pt is on blood thinner

## 2016-12-11 NOTE — ED Provider Notes (Signed)
[facility] Emergency Department Provider Note   ____________________________________________    I have reviewed the triage vital signs and the nursing notes.   HISTORY  Chief Complaint Chest Pain     HPI Laurie Steele is a 76 y.o. female who presents with complaints of chest discomfort. Patient reports she woke up at 4:30 this morning and felt nauseous and "heaved "but did not vomit. She reports her chest discomfort has resolved at this point. She denies shortness of breath. She is due for dialysis today. She denies fevers or chills. No recent travel. No pleurisy   Past Medical History:  Diagnosis Date  . Chronic anemia    Dr Inez Pilgrim  . GERD (gastroesophageal reflux disease)   . Glaucoma   . Gout   . Hx of colonic polyps   . Hyperlipidemia   . Hypertension   . Lower back pain   . Miscarriage   . 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 Peritoneal dialysis status ([place])   . Renal failure    Dr Holley Raring  . 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 Vaginal delivery    x 4    Patient Active Problem List   Diagnosis Date Noted  . Ds DNA antibody positive 07/09/2016  . Pneumonia 07/03/2016  . Anemia 06/17/2016  . Symptomatic anemia 06/15/2016  . Hypoglycemia 05/21/2016  . Chest pain 04/05/2016  . Hypercalcemia 03/14/2016  . Bronchitis 03/03/2012  . Type II or unspecified type diabetes mellitus with renal manifestations, not stated as uncontrolled(250.40) 12/18/2011  . END STAGE RENAL DISEASE 10/23/2010  . DM (diabetes mellitus), type 2 ([place]) 12/13/2009  . NEUROPATHY 12/13/2009  . GOUT 02/15/2009  . SLEEP DISORDER 02/15/2009  . GERD 02/23/2008  . Hyperlipidemia 05/14/2007  . ANEMIA, CHRONIC 05/14/2007  . Essential hypertension 05/14/2007    Past Surgical History:  Procedure Laterality Date  . ABDOMINAL  HYSTERECTOMY  1983  . AV FISTULA PLACEMENT Left 09/21/2016   Procedure: ARTERIOVENOUS (AV) FISTULA CREATION ( BRACHIOCEPHALIC );  Surgeon: Katha Cabal, MD;  Location: [facility] ORS;  Service: Vascular;  Laterality: Left;  . BACK SURGERY  6/08   Dr Collier Salina  . BACK SURGERY  1987   disc  . CHOLECYSTECTOMY    . INSERTION OF DIALYSIS CATHETER  2017  . PERITONEAL CATHETER INSERTION    . REMOVAL OF A DIALYSIS CATHETER N/A 09/21/2016   Procedure: REMOVAL OF A DIALYSIS CATHETER ( PERITONEAL DIALYSIS CATH );  Surgeon: Katha Cabal, MD;  Location: [facility] ORS;  Service: Vascular;  Laterality: N/A;    Prior to Admission medications   Medication Sig Start Date End Date Taking? Authorizing Provider  acetaminophen (TYLENOL) 650 MG CR tablet Take 650 mg by mouth every 8 (eight) hours as needed for pain.   Yes Historical Provider, MD  allopurinol (ZYLOPRIM) 100 MG tablet Take 1 tablet by mouth daily.    Yes Historical Provider, MD  amitriptyline (ELAVIL) 25 MG tablet Take 1 tablet by mouth at bedtime as needed (For foot pain).  01/02/16  Yes Historical Provider, MD  amLODipine (NORVASC) 10 MG tablet Take 10 mg by mouth daily. Take it mid-day 03/20/16  Yes Historical Provider, MD  aspirin EC 81 MG tablet Take 1 tablet (81 mg total) by mouth daily. Patient taking differently: Take 325 mg by mouth daily.  06/18/16  Yes Fritzi Mandes, MD  cinacalcet (SENSIPAR) 90 MG tablet Take 90 mg by mouth daily. With lunch   Yes Historical Provider, MD  cloNIDine (CATAPRES) 0.1 MG tablet Take 0.1 mg by mouth 2 (two) times daily. 11/08/16  Yes Historical Provider, MD  colchicine 0.6 MG tablet Take 0.5 tablets (0.3 mg total) by mouth 2 (two) times a week. 07/09/16  Yes Demetrios Loll, MD  cyclobenzaprine (FLEXERIL) 5 MG tablet Take by mouth.   Yes Historical Provider, MD  diclofenac sodium (VOLTAREN) 1 % GEL Apply 2 g four times a day PRN Not a candidate for oral NSAIDS, ESRD on PD 06/27/16  Yes Historical Provider, MD  famotidine  (PEPCID) 20 MG tablet  11/29/16  Yes Historical Provider, MD  ferrous sulfate 325 (65 FE) MG tablet Take 1 tablet (325 mg total) by mouth daily with breakfast. 06/18/16  Yes Fritzi Mandes, MD  fluticasone (FLONASE) 50 MCG/ACT nasal spray Place 2 sprays into both nostrils daily.    Yes Historical Provider, MD  furosemide (LASIX) 40 MG tablet Take 1 tablet (40 mg total) by mouth daily. 07/05/16  Yes Demetrios Loll, MD  gabapentin (NEURONTIN) 100 MG capsule Take 1 capsule (100 mg total) by mouth at bedtime. 07/05/16  Yes Demetrios Loll, MD  hydrALAZINE (APRESOLINE) 50 MG tablet Take 50 mg by mouth 2 (two) times daily.  11/29/16  Yes Historical Provider, MD  insulin aspart (NOVOLOG) 100 UNIT/ML injection Inject 0-5 Units into the skin at bedtime. Patient taking differently: Inject 0-5 Units into the skin at bedtime. If blood sugar is 70-120, no insulin. If 121-250 take 2 units, 251-300 take 3 units, 301-350 take 4 units, 351-400 take 5 units. If over 400 call physician. 05/28/16  Yes Fritzi Mandes, MD  ipratropium (ATROVENT) 0.03 % nasal spray Place 1 spray into both nostrils 3 (three) times daily as needed for rhinitis.   Yes Historical Provider, MD  multivitamin (RENA-VIT) TABS tablet Take 1 tablet by mouth daily.   Yes Historical Provider, MD  pantoprazole (PROTONIX) 40 MG tablet Take 1 tablet (40 mg total) by mouth daily. 06/18/16  Yes Fritzi Mandes, MD  pravastatin (PRAVACHOL) 80 MG tablet Take 40 mg by mouth at bedtime.  06/21/15  Yes Historical Provider, MD  sevelamer (RENVELA) 800 MG tablet Take 800 mg by mouth 3 (three) times daily with meals.    Yes Historical Provider, MD  HYDROcodone-acetaminophen (NORCO) 5-325 MG tablet Take 1-2 tablets by mouth every 6 (six) hours as needed for moderate pain or severe pain. Patient not taking: Reported on 12/05/2016 09/21/16   Katha Cabal, MD  insulin glargine (LANTUS) 100 UNIT/ML injection Inject 0.42 mLs (42 Units total) into the skin at bedtime. Patient not taking: Reported  on 12/11/2016 05/28/16   Fritzi Mandes, MD  latanoprost (XALATAN) 0.005 % ophthalmic solution Place 1 drop into both eyes at bedtime. 03/20/16   Historical Provider, MD     Allergies Alprazolam  Family History  Problem Relation Age of Onset  . Heart attack Father   . Hypertension Mother   . Hyperlipidemia Mother   . Coronary artery disease      strong fam hx  . Breast cancer Neg Hx     Social History Social History  Substance Use Topics  . Smoking status: Former Smoker    Quit date: 11/05/1990  . Smokeless tobacco: Never Used  . Alcohol use No    Review of Systems  Constitutional: No fever/chills  Cardiovascular: As above Respiratory: Denies shortness of  breath. No cough Gastrointestinal: No abdominal pain.  Nausea now resolved  Musculoskeletal: Negative for back pain. Skin: Negative for rash. Neurological: Negative for headaches   10-point ROS otherwise negative.  ____________________________________________   PHYSICAL EXAM:  VITAL SIGNS: ED Triage Vitals  Enc Vitals Group     BP 12/11/16 0745 (!) 160/60     Pulse Rate 12/11/16 0745 96     Resp 12/11/16 0745 18     Temp 12/11/16 0745 98.1 F (36.7 C)     Temp Source 12/11/16 0745 Oral     SpO2 12/11/16 0745 97 %     Weight 12/11/16 0743 180 lb (81.6 kg)     Height 12/11/16 0743 5\' 7"  (1.702 m)     Head Circumference --      Peak Flow --      Pain Score 12/11/16 0743 3     Pain Loc --      Pain Edu? --      Excl. in [place]? --     Constitutional: Alert and oriented. No acute distress. Pleasant and interactive Eyes: Conjunctivae are normal.   Nose: No congestion/rhinnorhea. Mouth/Throat: Mucous membranes are moist.    Cardiovascular: Normal rate, regular rhythm. Grossly normal heart sounds.  Good peripheral circulation. Left AV fistula, positive thrill Respiratory: Normal respiratory effort.  No retractions. Lungs CTAB. Gastrointestinal: Soft and nontender. No distention.  No CVA tenderness. Genitourinary:  deferred Musculoskeletal: No lower extremity tenderness nor edema.  Warm and well perfused Neurologic:  Normal speech and language. No gross focal neurologic deficits are appreciated.  Skin:  Skin is warm, dry and intact. No rash noted. Psychiatric: Mood and affect are normal. Speech and behavior are normal.  ____________________________________________   LABS (all labs ordered are listed, but only abnormal results are displayed)  Labs Reviewed  BASIC METABOLIC PANEL - Abnormal; Notable for the following:       Result Value   Chloride 100 (*)    Glucose, Bld 277 (*)    BUN 46 (*)    Creatinine, Ser 7.59 (*)    GFR calc non Af Amer 5 (*)    GFR calc Af Amer 5 (*)    Anion gap 16 (*)    All other components within normal limits  CBC - Abnormal; Notable for the following:    RBC 3.01 (*)    Hemoglobin 10.3 (*)    HCT 31.8 (*)    MCV 105.7 (*)    MCH 34.3 (*)    RDW 17.2 (*)    All other components within normal limits  TROPONIN I - Abnormal; Notable for the following:    Troponin I 0.07 (*)    All other components within normal limits   ____________________________________________  EKG  ED ECG REPORT I, Lavonia Drafts, the attending physician, personally viewed and interpreted this ECG.  Date: 12/11/2016 EKG Time: 7:48 AM Rate: 96 Rhythm: normal sinus rhythm QRS Axis: normal Intervals: normal ST/T Wave abnormalities: Nonspecific Conduction Disturbances: none   ____________________________________________  RADIOLOGY  Chest x-ray shows mild interstitial edema consistent with CHF ____________________________________________   PROCEDURES  Procedure(s) performed: yes  Angiocath insertion Performed by: Lavonia Drafts  Consent: Verbal consent obtained. Risks and benefits: risks, benefits and alternatives were discussed Time out: Immediately prior to procedure a "time out" was called to verify the correct patient, procedure, equipment, support staff and  site/side marked as required.  Preparation: Patient was prepped and draped in the usual sterile fashion.  Vein Location: Right arm  Ultrasound Guided  Gauge: 18  Normal blood return and flush without difficulty Patient tolerance: Patient tolerated the procedure well with no immediate complications.       Critical Care performed: No ____________________________________________   INITIAL IMPRESSION / ASSESSMENT AND PLAN / ED COURSE  Pertinent labs & imaging results that were available during my care of the patient were reviewed by me and considered in my medical decision making (see chart for details).  Patient presents with complaints of nausea and chest discomfort, now resolved. She is due for dialysis today and her chest x-ray is reflective of that however she does not feel short of breath. Her troponin is elevated but appears chronically elevated. EKG is unchanged. We'll discuss with nephrology  Dr. Abigail Butts recommends admission for eval by cardiology and dialysis. Patient agrees with plan    ____________________________________________   FINAL CLINICAL IMPRESSION(S) / ED DIAGNOSES  Final diagnoses:  Nonspecific chest pain  Shortness of breath      NEW MEDICATIONS STARTED DURING THIS VISIT:  New Prescriptions   No medications on file     Note:  This document was prepared using Dragon voice recognition software and may include unintentional dictation errors.    Lavonia Drafts, MD 12/11/16 [phone]

## 2016-12-11 NOTE — Progress Notes (Signed)
Attempted to received report from ED, RN busy will call me back

## 2016-12-11 NOTE — Progress Notes (Signed)
Pre HD Assessment  

## 2016-12-11 NOTE — Progress Notes (Signed)
Patient nauseous and vomited x1 81ml,zofran given ,will assess for effectiveness

## 2016-12-11 NOTE — Progress Notes (Signed)
HD TX started  

## 2016-12-12 LAB — HEPATITIS B SURFACE ANTIGEN: Hepatitis B Surface Ag: NEGATIVE

## 2016-12-12 LAB — HEPATITIS B CORE ANTIBODY, TOTAL: HEP B C TOTAL AB: NEGATIVE

## 2016-12-12 LAB — BASIC METABOLIC PANEL
ANION GAP: 9 (ref 5–15)
BUN: 30 mg/dL — ABNORMAL HIGH (ref 6–20)
CALCIUM: 9 mg/dL (ref 8.9–10.3)
CO2: 29 mmol/L (ref 22–32)
Chloride: 100 mmol/L — ABNORMAL LOW (ref 101–111)
Creatinine, Ser: 5.64 mg/dL — ABNORMAL HIGH (ref 0.44–1.00)
GFR, EST AFRICAN AMERICAN: 8 mL/min — AB (ref >60)
GFR, EST NON AFRICAN AMERICAN: 7 mL/min — AB (ref >60)
Glucose, Bld: 139 mg/dL — ABNORMAL HIGH (ref 65–99)
POTASSIUM: 3.9 mmol/L (ref 3.5–5.1)
Sodium: 138 mmol/L (ref 135–145)

## 2016-12-12 LAB — GLUCOSE, CAPILLARY
GLUCOSE-CAPILLARY: 125 mg/dL — AB (ref 65–99)
GLUCOSE-CAPILLARY: 129 mg/dL — AB (ref 65–99)
Glucose-Capillary: 137 mg/dL — ABNORMAL HIGH (ref 65–99)
Glucose-Capillary: 279 mg/dL — ABNORMAL HIGH (ref 65–99)

## 2016-12-12 LAB — CBC
HEMATOCRIT: 25.3 % — AB (ref 35.0–47.0)
HEMOGLOBIN: 8.6 g/dL — AB (ref 12.0–16.0)
MCH: 35 pg — ABNORMAL HIGH (ref 26.0–34.0)
MCHC: 33.8 g/dL (ref 32.0–36.0)
MCV: 103.4 fL — ABNORMAL HIGH (ref 80.0–100.0)
Platelets: 182 10*3/uL (ref 150–440)
RBC: 2.45 MIL/uL — AB (ref 3.80–5.20)
RDW: 16.7 % — ABNORMAL HIGH (ref 11.5–14.5)
WBC: 7.4 10*3/uL (ref 3.6–11.0)

## 2016-12-12 LAB — HEMOGLOBIN A1C
Hgb A1c MFr Bld: 7.1 % — ABNORMAL HIGH (ref 4.8–5.6)
MEAN PLASMA GLUCOSE: 157 mg/dL

## 2016-12-12 LAB — ECHOCARDIOGRAM COMPLETE
HEIGHTINCHES: 67 in
WEIGHTICAEL: 2878.33 [oz_av]

## 2016-12-12 LAB — TROPONIN I: Troponin I: 0.62 ng/mL (ref <0.03)

## 2016-12-12 LAB — HEPATITIS B SURFACE ANTIBODY,QUALITATIVE: HEP B S AB: REACTIVE

## 2016-12-12 MED ORDER — IPRATROPIUM-ALBUTEROL 0.5-2.5 (3) MG/3ML IN SOLN
3.0000 mL | Freq: Four times a day (QID) | RESPIRATORY_TRACT | Status: DC
  Administered 2016-12-12 – 2016-12-13 (×6): 3 mL via RESPIRATORY_TRACT
  Filled 2016-12-12 (×6): qty 3

## 2016-12-12 MED ORDER — METOPROLOL SUCCINATE ER 25 MG PO TB24
25.0000 mg | ORAL_TABLET | Freq: Every day | ORAL | Status: DC
  Administered 2016-12-12 – 2016-12-13 (×2): 25 mg via ORAL
  Filled 2016-12-12 (×2): qty 1

## 2016-12-12 NOTE — Progress Notes (Signed)
PT Cancellation Note  Patient Details Name: Laurie Steele MRN: [id number] DOB: 10-30-1941   Cancelled Treatment:    Reason Eval/Treat Not Completed: Medical issues which prohibited therapy. Patient has been complaining of chest pain and had been ~80% on RA. In the setting of elevating troponins 0.18 -> 0.44 -> 0.62 and outstanding cardiology consult, PT will hold mobility evaluation until patient is more medically stable.    Royce Macadamia PT, DPT, CSCS    12/12/2016, 8:23 AM

## 2016-12-12 NOTE — Progress Notes (Signed)
Found to be 80% on RA. Placed on 3L O2 and SATs up to 94%. Instructed to leave O2 on.

## 2016-12-12 NOTE — Progress Notes (Signed)
Patient ID: Laurie Steele, female   DOB: 20-Jul-1941, 76 y.o.   MRN: [id number]  [facility] PROGRESS NOTE  Laurie Steele TKP:[id number] DOB: Dec 18, 1940 DOA: 12/11/2016 PCP: [name], MD  HPI/Subjective: Patient with cough and shortness of breath.  Objective: Vitals:   12/12/16 0451 12/12/16 1251  BP:  (!) 109/33  Pulse: 83 72  Resp:    Temp:      Filed Weights   12/11/16 0743 12/11/16 1603 12/11/16 2019  Weight: 81.6 kg (180 lb) 81.6 kg (179 lb 14.3 oz) 81.3 kg (179 lb 3.2 oz)    ROS: Review of Systems  Constitutional: Negative for chills and fever.  Eyes: Negative for blurred vision.  Respiratory: Positive for cough and shortness of breath.   Cardiovascular: Negative for chest pain.  Gastrointestinal: Negative for abdominal pain, constipation, diarrhea, nausea and vomiting.  Genitourinary: Negative for dysuria.  Musculoskeletal: Negative for joint pain.  Neurological: Negative for dizziness and headaches.   Exam: Physical Exam  Constitutional: She is oriented to person, place, and time.  HENT:  Nose: No mucosal edema.  Mouth/Throat: No oropharyngeal exudate or posterior oropharyngeal edema.  Eyes: Conjunctivae, EOM and lids are normal. Pupils are equal, round, and reactive to light.  Neck: No JVD present. Carotid bruit is not present. No edema present. No thyroid mass and no thyromegaly present.  Cardiovascular: S1 normal and S2 normal.  Exam reveals no gallop.   No murmur heard. Pulses:      Dorsalis pedis pulses are 2+ on the right side, and 2+ on the left side.  Respiratory: No respiratory distress. She has decreased breath sounds in the right middle field, the right lower field, the left middle field and the left lower field. She has no wheezes. She has no rhonchi. She has rales in the right middle field, the right lower field, the left middle field and the left lower field.  GI: Soft. Bowel sounds are normal. There is no tenderness.   Musculoskeletal:       Right shoulder: She exhibits no swelling.       Right ankle: She exhibits swelling.       Left ankle: She exhibits swelling.  Lymphadenopathy:    She has no cervical adenopathy.  Neurological: She is alert and oriented to person, place, and time. No cranial nerve deficit.  Skin: Skin is warm. No rash noted. Nails show no clubbing.  Psychiatric: She has a normal mood and affect.      Data Reviewed: Basic Metabolic Panel:  Recent Labs Lab 12/11/16 0747 12/12/16 0143  NA 140 138  K 3.7 3.9  CL 100* 100*  CO2 24 29  GLUCOSE 277* 139*  BUN 46* 30*  CREATININE 7.59* 5.64*  CALCIUM 9.6 9.0   CBC:  Recent Labs Lab 12/11/16 0747 12/12/16 0143  WBC 10.6 7.4  HGB 10.3* 8.6*  HCT 31.8* 25.3*  MCV 105.7* 103.4*  PLT 226 182   Cardiac Enzymes:  Recent Labs Lab 12/11/16 0747 12/11/16 1436 12/11/16 2033 12/12/16 0143  TROPONINI 0.07* 0.18* 0.44* 0.62*    CBG:  Recent Labs Lab 12/11/16 2154 12/12/16 0757 12/12/16 1230  GLUCAP 151* 125* 137*       Studies: Dg Chest 2 View  Result Date: 12/11/2016 CLINICAL DATA:  Two weeks of cough. Onset of mid chest pain this morning. History of hypertension, diabetes, and renal failure. Discontinued smoking 30 years ago. EXAM: CHEST  2 VIEW COMPARISON:  PA and lateral chest x-ray of July 21, 2016  FINDINGS: The lungs are well-expanded. The interstitial markings are mildly increased today. There is no alveolar infiltrate. There is a trace of blunting of the posterior costophrenic angles bilaterally which is new. The heart is top-normal in size. The central pulmonary vascularity is prominent and more conspicuous than in the past. The dual-lumen dialysis catheter is in stable position. There is calcification in the wall of the aortic arch. There is no acute bony abnormality. IMPRESSION: COPD. Mild interstitial edema consistent with CHF with small bilateral pleural effusions blunting the costophrenic angles.  No acute pneumonia. Electronically Signed   By: David  Martinique M.D.   On: 12/11/2016 08:11    Scheduled Meds: . allopurinol  100 mg Oral Daily  . amLODipine  10 mg Oral Daily  . aspirin EC  325 mg Oral Daily  . cinacalcet  90 mg Oral Daily  . cloNIDine  0.1 mg Oral BID  . docusate sodium  100 mg Oral BID  . famotidine  20 mg Oral Daily  . ferrous sulfate  325 mg Oral Q breakfast  . fluticasone  2 spray Each Nare Daily  . furosemide  40 mg Oral Daily  . gabapentin  100 mg Oral QHS  . heparin  5,000 Units Subcutaneous Q8H  . hydrALAZINE  50 mg Oral BID  . insulin aspart  0-5 Units Subcutaneous QHS  . insulin aspart  0-9 Units Subcutaneous TID WC  . insulin glargine  10 Units Subcutaneous QHS  . ipratropium-albuterol  3 mL Nebulization Q6H  . latanoprost  1 drop Both Eyes QHS  . multivitamin  1 tablet Oral Daily  . pantoprazole  40 mg Oral Daily  . pravastatin  40 mg Oral QHS  . sevelamer carbonate  800 mg Oral TID WC    Assessment/Plan:  1. Acute hypoxic respiratory failure. Continue oxygen supplementation. 2. 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 On oral Lasix and dialysis to remove fluid. Add low-dose beta blocker. 3. Likely sleep apnea overnight oximetry ordered. Will need sleep study as outpatient. 4. Essential hypertension continue clonidine, hydralazine, Toprol discontinue Norvasc 5. End-stage renal disease on dialysis. Reevaluate tomorrow after dialysis. 6. Type 2 diabetes mellitus continue glargine insulin and sliding scale 7. GERD on Protonix 8. Hyperlipidemia unspecified on pravastatin 9. Diabetic neuropathy on gabapentin 10. History of gout on renally dosed allopurinol  Code Status:     Code Status Orders        Start     Ordered   12/11/16 1345  Full code  Continuous     12/11/16 1344    Code Status History    Date Active Date Inactive Code Status Order ID Comments User Context   07/03/2016  8:15 PM 07/05/2016  7:43 PM  Full Code [id number]  Vaughan Basta, MD Inpatient   06/15/2016  7:15 PM 06/18/2016  8:26 PM Full Code [id number]  Harvie Bridge, DO Inpatient   05/21/2016  4:28 PM 05/28/2016  6:11 PM Full Code [id number]  Henreitta Leber, MD Inpatient   04/05/2016  4:14 PM 04/06/2016  9:07 PM Full Code [id number]  Loletha Grayer, MD ED   03/14/2016 11:50 PM 03/16/2016  3:34 PM Full Code [id number]  Max Sane, MD Inpatient     Disposition Plan: Reevaluate tomorrow after dialysis  Consultants:  nephrology  Time spent: 28 minutes  [place], [facility]

## 2016-12-12 NOTE — Care Management (Signed)
Patient receives chronic hemodialysis through [facility].  Her current 02 requirement is acute.  Discussed home 02 assessment during progression.  CM tried to speak with patient his afternoon and she was very lethargic and falling to sleep.  Notified Cheryl with [facility] of admission.  Patient has a fistula which has not been used and permcath

## 2016-12-12 NOTE — Progress Notes (Signed)
Notified Dr. Ara Kussmaul of troponin of .44.

## 2016-12-12 NOTE — Progress Notes (Signed)
[facility]  ROUNDING NOTE   Subjective:   Hemodialysis yesterday. Tolerated treatment today. UF of 2 litre.  Husband at bedside.  Hypoxic this morning, 3 L [place]  Objective:  Vital signs in last 24 hours:  Temp:  [98.1 F (36.7 C)-99 F (37.2 C)] 98.1 F (36.7 C) (02/07 0448) Pulse Rate:  [83-114] 83 (02/07 0451) Resp:  [16-35] 16 (02/07 0448) BP: (117-178)/(31-100) 128/31 (02/07 0448) SpO2:  [80 %-100 %] 95 % (02/07 0451) Weight:  [81.3 kg (179 lb 3.2 oz)-81.6 kg (179 lb 14.3 oz)] 81.3 kg (179 lb 3.2 oz) (02/06 2019)  Weight change:  Filed Weights   12/11/16 0743 12/11/16 1603 12/11/16 2019  Weight: 81.6 kg (180 lb) 81.6 kg (179 lb 14.3 oz) 81.3 kg (179 lb 3.2 oz)    Intake/Output: I/O last 3 completed shifts: In: -  Out: 2000 [Other:2000]   Intake/Output this shift:  Total I/O In: 120 [P.O.:120] Out: -   Physical Exam: General: NAD  Head: Normocephalic, atraumatic. Moist oral mucosal membranes  Eyes: Anicteric, PERRL  Neck: Supple, trachea midline  Lungs:  Clear to auscultation  Heart: Regular rate and rhythm  Abdomen:  Soft, nontender,   Extremities: no peripheral edema.  Neurologic: Nonfocal, moving all four extremities  Skin: No lesions  Access: RIJ permcath    Basic Metabolic Panel:  Recent Labs Lab 12/11/16 0747 12/12/16 0143  NA 140 138  K 3.7 3.9  CL 100* 100*  CO2 24 29  GLUCOSE 277* 139*  BUN 46* 30*  CREATININE 7.59* 5.64*  CALCIUM 9.6 9.0    Liver Function Tests: No results for input(s): AST, ALT, ALKPHOS, BILITOT, PROT, ALBUMIN in the last 168 hours. No results for input(s): LIPASE, AMYLASE in the last 168 hours. No results for input(s): AMMONIA in the last 168 hours.  CBC:  Recent Labs Lab 12/11/16 0747 12/12/16 0143  WBC 10.6 7.4  HGB 10.3* 8.6*  HCT 31.8* 25.3*  MCV 105.7* 103.4*  PLT 226 182    Cardiac Enzymes:  Recent Labs Lab 12/11/16 0747 12/11/16 1436 12/11/16 2033 12/12/16 0143  TROPONINI  0.07* 0.18* 0.44* 0.62*    BNP: Invalid input(s): POCBNP  CBG:  Recent Labs Lab 12/11/16 2154 12/12/16 0757  GLUCAP 151* 125*    Microbiology: Results for orders placed or performed during the hospital encounter of 09/19/16  Surgical pcr screen     Status: None   Collection Time: 09/19/16  1:12 PM  Result Value Ref Range Status   MRSA, PCR NEGATIVE NEGATIVE Final   Staphylococcus aureus NEGATIVE NEGATIVE Final    Comment:        The Xpert SA Assay (FDA approved for NASAL specimens in patients over 77 years of age), is one component of a comprehensive surveillance program.  Test performance has been validated by [facility] for patients greater than or equal to 77 year old. It is not intended to diagnose infection nor to guide or monitor treatment.     Coagulation Studies: No results for input(s): LABPROT, INR in the last 72 hours.  Urinalysis: No results for input(s): COLORURINE, LABSPEC, PHURINE, GLUCOSEU, HGBUR, BILIRUBINUR, KETONESUR, PROTEINUR, UROBILINOGEN, NITRITE, LEUKOCYTESUR in the last 72 hours.  Invalid input(s): APPERANCEUR    Imaging: Dg Chest 2 View  Result Date: 12/11/2016 CLINICAL DATA:  Two weeks of cough. Onset of mid chest pain this morning. History of hypertension, diabetes, and renal failure. Discontinued smoking 30 years ago. EXAM: CHEST  2 VIEW COMPARISON:  PA and lateral  chest x-ray of July 21, 2016 FINDINGS: The lungs are well-expanded. The interstitial markings are mildly increased today. There is no alveolar infiltrate. There is a trace of blunting of the posterior costophrenic angles bilaterally which is new. The heart is top-normal in size. The central pulmonary vascularity is prominent and more conspicuous than in the past. The dual-lumen dialysis catheter is in stable position. There is calcification in the wall of the aortic arch. There is no acute bony abnormality. IMPRESSION: COPD. Mild interstitial edema consistent with CHF with  small bilateral pleural effusions blunting the costophrenic angles. No acute pneumonia. Electronically Signed   By: David  Martinique M.D.   On: 12/11/2016 08:11     Medications:    . allopurinol  100 mg Oral Daily  . amLODipine  10 mg Oral Daily  . aspirin EC  325 mg Oral Daily  . cinacalcet  90 mg Oral Daily  . cloNIDine  0.1 mg Oral BID  . docusate sodium  100 mg Oral BID  . famotidine  20 mg Oral Daily  . ferrous sulfate  325 mg Oral Q breakfast  . fluticasone  2 spray Each Nare Daily  . furosemide  40 mg Oral Daily  . gabapentin  100 mg Oral QHS  . heparin  5,000 Units Subcutaneous Q8H  . hydrALAZINE  50 mg Oral BID  . insulin aspart  0-5 Units Subcutaneous QHS  . insulin aspart  0-9 Units Subcutaneous TID WC  . insulin glargine  10 Units Subcutaneous QHS  . ipratropium-albuterol  3 mL Nebulization Q6H  . latanoprost  1 drop Both Eyes QHS  . multivitamin  1 tablet Oral Daily  . pantoprazole  40 mg Oral Daily  . pravastatin  40 mg Oral QHS  . sevelamer carbonate  800 mg Oral TID WC   acetaminophen **OR** acetaminophen, amitriptyline, dextromethorphan, guaiFENesin, hydrALAZINE, HYDROcodone-acetaminophen, ondansetron **OR** ondansetron (ZOFRAN) IV, polyethylene glycol  Assessment/ Plan:  Laurie Steele is a 76 y.o. black female with ESRD on hemodialysis, diabetes mellitus type II, hypertension, hyperlipidemia, gout, glaucoma, GERD, anemia, coronary artery disease  TTS [name].   1. End Stage Renal Disease: hemodialysis yesterday. Tolerated treatment well.  Continue TTS schedule.   2. Hypertension: at goal.  - amlodipine, clonidine, furosemide, hydralazine  3. Secondary Hyperparathyroidism: PTH elevated 695 on 1/23. Phosphorus and calcium at goal.  - cinacalcet - sevelamer with meals  4. Anemia of chronic kidney disease: hemoglobin 8.6 - epo with dialysis.   LOS: [name], [name] 2/7/201811:29 AM

## 2016-12-12 NOTE — Consult Note (Signed)
[facility]  CARDIOLOGY CONSULT NOTE  Patient ID: Laurie Steele MRN: [id number] DOB/AGE: 1941-07-26 76 y.o.  Admit date: 12/11/2016 Referring Physician Dr. Leslye Peer Primary Physician Dr. Ginette Pitman Primary Cardiologist Dr. Ubaldo Glassing Reason for Consultation chf  HPI: Patient is a 76 year old female with history of coronary artery with a known ejection fraction of 40-45% with anteroseptal hypokinesis, history of end-stage renal disease on hemodialysis, history of hypertension. She was admitted with progressive shortness of breath and was noted to be in pulmonary edema. On admission day she was scheduled for hemodialysis. She reports compliance with her hemodialysis. She also is attempting to eat a low-sodium diet. She complains of weakness fatigue and shortness of breath. She has mild chest fullness. Her initial troponin was 0.07. His increased to 0.62. Electrocardiogram reveals sinus rhythm with no significant ischemic changes. Chest x-ray reveals evidence of COPD with mild interstitial edema consistent with congestive heart failure with small bilateral pleural effusions and blunting of the costophrenic angles. There is no airspace disease or pneumonia. Her serum potassium was 3.9. Patient states it treated. The cough and shortness of breath. She also complained of heaviness in her midsternal region. Echocardiogram revealed ejection fraction of 40-45% with anteroseptal akinesis similar to previous echocardiograms.  Review of Systems  Eyes: Negative.   Respiratory: Positive for shortness of breath.   Cardiovascular: Positive for chest pain.  Gastrointestinal: Negative.   Genitourinary: Negative.   Musculoskeletal: Positive for myalgias.  Neurological: Positive for weakness.  Endo/Heme/Allergies: Negative.   Psychiatric/Behavioral: Negative.     Past Medical History:  Diagnosis Date  . CAD (coronary artery disease)   . Chronic anemia    Dr Inez Pilgrim  . GERD  (gastroesophageal reflux disease)   . Glaucoma   . Gout   . Hx of colonic polyps   . Hyperlipidemia   . Hypertension   . Lower back pain   . Miscarriage   . 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 Peritoneal dialysis status ([place])   . Renal failure    Dr Holley Raring  . 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 Vaginal delivery    x 4    Family History  Problem Relation Age of Onset  . Heart attack Father   . Hypertension Mother   . Hyperlipidemia Mother   . Coronary artery disease      strong fam hx  . Kidney failure Brother   . Breast cancer Neg Hx     Social History   Social History  . Marital status: Widowed    Spouse name: N/A  . Number of children: 4  . Years of education: N/A   Occupational History  . retired Retired    [employer] 2003   Social History Main Topics  . Smoking status: Former Smoker    Quit date: 11/05/1990  . Smokeless tobacco: Never Used  . Alcohol use No  . Drug use: No  . Sexual activity: Not on file   Other Topics Concern  . Not on file   Social History Narrative   Lives at home with son. Has a walker. Dependent for dome daily activities    Past Surgical History:  Procedure Laterality Date  . ABDOMINAL HYSTERECTOMY  1983  . AV FISTULA PLACEMENT Left 09/21/2016   Procedure: ARTERIOVENOUS (AV) FISTULA CREATION ( BRACHIOCEPHALIC );  Surgeon: Dolores Lory  Schnier, MD;  Location: [facility] ORS;  Service: Vascular;  Laterality: Left;  . BACK SURGERY  6/08   Dr Collier Salina  . BACK SURGERY  1987   disc  . CHOLECYSTECTOMY    . INSERTION OF DIALYSIS CATHETER  2017  . PERITONEAL CATHETER INSERTION    . REMOVAL OF A DIALYSIS CATHETER N/A 09/21/2016   Procedure: REMOVAL OF A DIALYSIS CATHETER ( PERITONEAL DIALYSIS CATH );  Surgeon: Katha Cabal, MD;  Location: [facility] ORS;  Service: Vascular;  Laterality: N/A;     Prescriptions  Prior to Admission  Medication Sig Dispense Refill Last Dose  . acetaminophen (TYLENOL) 650 MG CR tablet Take 650 mg by mouth every 8 (eight) hours as needed for pain.   prn at prn  . allopurinol (ZYLOPRIM) 100 MG tablet Take 1 tablet by mouth daily.    12/10/2016 at 0800  . amitriptyline (ELAVIL) 25 MG tablet Take 1 tablet by mouth at bedtime as needed (For foot pain).   3 PRN at PRN  . amLODipine (NORVASC) 10 MG tablet Take 10 mg by mouth daily. Take it mid-day  11 12/10/2016 at 0800  . aspirin EC 81 MG tablet Take 1 tablet (81 mg total) by mouth daily. (Patient taking differently: Take 325 mg by mouth daily. ) 30 tablet 1 12/10/2016 at 0800  . cinacalcet (SENSIPAR) 90 MG tablet Take 90 mg by mouth daily. With lunch   12/10/2016 at 1200  . cloNIDine (CATAPRES) 0.1 MG tablet Take 0.1 mg by mouth 2 (two) times daily.   12/10/2016 at 0800  . colchicine 0.6 MG tablet Take 0.5 tablets (0.3 mg total) by mouth 2 (two) times a week. 4 tablet 2 PRN at PRN  . cyclobenzaprine (FLEXERIL) 5 MG tablet Take by mouth.   PRN at PRN  . diclofenac sodium (VOLTAREN) 1 % GEL Apply 2 g four times a day PRN Not a candidate for oral NSAIDS, ESRD on PD   PRN at PRN  . famotidine (PEPCID) 20 MG tablet    PRN at PRN  . ferrous sulfate 325 (65 FE) MG tablet Take 1 tablet (325 mg total) by mouth daily with breakfast. 30 tablet 3 12/10/2016 at 0800  . fluticasone (FLONASE) 50 MCG/ACT nasal spray Place 2 sprays into both nostrils daily.    PRN at PRN  . furosemide (LASIX) 40 MG tablet Take 1 tablet (40 mg total) by mouth daily. 30 tablet 2 12/10/2016 at 0800  . gabapentin (NEURONTIN) 100 MG capsule Take 1 capsule (100 mg total) by mouth at bedtime. 30 capsule 0 12/10/2016 at 2000  . hydrALAZINE (APRESOLINE) 50 MG tablet Take 50 mg by mouth 2 (two) times daily.    12/10/2016 at 2000  . insulin aspart (NOVOLOG) 100 UNIT/ML injection Inject 0-5 Units into the skin at bedtime. (Patient taking differently: Inject 0-5 Units into the skin at bedtime.  If blood sugar is 70-120, no insulin. If 121-250 take 2 units, 251-300 take 3 units, 301-350 take 4 units, 351-400 take 5 units. If over 400 call physician.) 10 mL 11 PRN at PRN  . ipratropium (ATROVENT) 0.03 % nasal spray Place 1 spray into both nostrils 3 (three) times daily as needed for rhinitis.   PRN at PRN  . multivitamin (RENA-VIT) TABS tablet Take 1 tablet by mouth daily.   12/10/2016 at 0800  . pantoprazole (PROTONIX) 40 MG tablet Take 1 tablet (40 mg total) by mouth daily. 30 tablet 1 12/10/2016 at 0800  . pravastatin (PRAVACHOL) 80  MG tablet Take 40 mg by mouth at bedtime.    12/10/2016 at 2000  . sevelamer (RENVELA) 800 MG tablet Take 800 mg by mouth 3 (three) times daily with meals.    12/10/2016 at 2000  . HYDROcodone-acetaminophen (NORCO) 5-325 MG tablet Take 1-2 tablets by mouth every 6 (six) hours as needed for moderate pain or severe pain. (Patient not taking: Reported on 12/05/2016) 50 tablet 0 Not Taking at Unknown time  . insulin glargine (LANTUS) 100 UNIT/ML injection Inject 0.42 mLs (42 Units total) into the skin at bedtime. (Patient not taking: Reported on 12/11/2016) 7 vial 3 Not Taking at Unknown time  . latanoprost (XALATAN) 0.005 % ophthalmic solution Place 1 drop into both eyes at bedtime.  6 Taking    Physical Exam: Blood pressure (!) 128/31, pulse 83, temperature 98.1 F (36.7 C), resp. rate 16, height 5\' 7"  (1.702 m), weight 81.3 kg (179 lb 3.2 oz), SpO2 95 %.   Wt Readings from Last 1 Encounters:  12/11/16 81.3 kg (179 lb 3.2 oz)     General appearance: alert and cooperative Resp: rales bilaterally Chest wall: no tenderness Cardio: regular rate and rhythm GI: soft, non-tender; bowel sounds normal; no masses,  no organomegaly Extremities: edema 2+ edema Neurologic: Grossly normal  Labs:   Lab Results  Component Value Date   WBC 7.4 12/12/2016   HGB 8.6 (L) 12/12/2016   HCT 25.3 (L) 12/12/2016   MCV 103.4 (H) 12/12/2016   PLT 182 12/12/2016    Recent  Labs Lab 12/12/16 0143  NA 138  K 3.9  CL 100*  CO2 29  BUN 30*  CREATININE 5.64*  CALCIUM 9.0  GLUCOSE 139*   Lab Results  Component Value Date   CKTOTAL 84 11/29/2012   CKMB 3.4 11/29/2012   TROPONINI 0.62 ([place]) 12/12/2016      Radiology: Pulmonary edema EKG: Normal sinus rhythm  ASSESSMENT AND PLAN:  Patient is a 76 year old female with history of end-stage renal disease hemodialysis Tuesday Thursday and Saturday who was admitted yesterday with chest pain and shortness of breath. She underwent hemodialysis without any difficulty. She had evidence of congestive heart failure and chest x-ray. She also had chest pain and elevated serum troponin to 0.62. Patient feels better since undergoing hemodialysis last night. She has no further chest pain. She denies orthopnea or PND. We'll repeat electrocardiogram today and follow for further symptoms this morning. Should she be symptom free with no changes on electrocardiogram, would continue with medical management. Should she have any further chest pain or significant changes on her electrocardiogram, would consider invasive evaluation. Echocardiogram done last p.m. revealed ejection fraction of 40-45% with anteroseptal akinesis Signed: Teodoro Spray MD, [facility] 12/12/2016, 8:13 AM

## 2016-12-13 LAB — RENAL FUNCTION PANEL
ALBUMIN: 2.9 g/dL — AB (ref 3.5–5.0)
Anion gap: 10 (ref 5–15)
BUN: 49 mg/dL — ABNORMAL HIGH (ref 6–20)
CALCIUM: 8 mg/dL — AB (ref 8.9–10.3)
CO2: 28 mmol/L (ref 22–32)
CREATININE: 7.8 mg/dL — AB (ref 0.44–1.00)
Chloride: 98 mmol/L — ABNORMAL LOW (ref 101–111)
GFR calc Af Amer: 5 mL/min — ABNORMAL LOW (ref >60)
GFR calc non Af Amer: 4 mL/min — ABNORMAL LOW (ref >60)
Glucose, Bld: 131 mg/dL — ABNORMAL HIGH (ref 65–99)
PHOSPHORUS: 5.4 mg/dL — AB (ref 2.5–4.6)
Potassium: 4.2 mmol/L (ref 3.5–5.1)
SODIUM: 136 mmol/L (ref 135–145)

## 2016-12-13 LAB — CBC
HCT: 24.9 % — ABNORMAL LOW (ref 35.0–47.0)
Hemoglobin: 8.2 g/dL — ABNORMAL LOW (ref 12.0–16.0)
MCH: 34.6 pg — ABNORMAL HIGH (ref 26.0–34.0)
MCHC: 32.8 g/dL (ref 32.0–36.0)
MCV: 105.7 fL — ABNORMAL HIGH (ref 80.0–100.0)
PLATELETS: 157 10*3/uL (ref 150–440)
RBC: 2.36 MIL/uL — ABNORMAL LOW (ref 3.80–5.20)
RDW: 16.8 % — AB (ref 11.5–14.5)
WBC: 8.6 10*3/uL (ref 3.6–11.0)

## 2016-12-13 LAB — GLUCOSE, CAPILLARY
GLUCOSE-CAPILLARY: 125 mg/dL — AB (ref 65–99)
GLUCOSE-CAPILLARY: 176 mg/dL — AB (ref 65–99)
Glucose-Capillary: 124 mg/dL — ABNORMAL HIGH (ref 65–99)
Glucose-Capillary: 161 mg/dL — ABNORMAL HIGH (ref 65–99)

## 2016-12-13 LAB — MRSA PCR SCREENING: MRSA BY PCR: NEGATIVE

## 2016-12-13 MED ORDER — EPOETIN ALFA 10000 UNIT/ML IJ SOLN
10000.0000 [IU] | INTRAMUSCULAR | Status: DC
  Administered 2016-12-13: 10000 [IU] via INTRAVENOUS

## 2016-12-13 MED ORDER — DOCUSATE SODIUM 100 MG PO CAPS: 100.0000 mg | ORAL_CAPSULE | Freq: Every day | ORAL | 0 refills | Status: AC | PRN

## 2016-12-13 MED ORDER — METOPROLOL SUCCINATE ER 25 MG PO TB24: 25.0000 mg | ORAL_TABLET | Freq: Every day | ORAL | 0 refills | Status: DC

## 2016-12-13 MED ORDER — IPRATROPIUM-ALBUTEROL 0.5-2.5 (3) MG/3ML IN SOLN
3.0000 mL | Freq: Three times a day (TID) | RESPIRATORY_TRACT | Status: DC
  Administered 2016-12-14: 3 mL via RESPIRATORY_TRACT
  Filled 2016-12-13: qty 3

## 2016-12-13 NOTE — Progress Notes (Signed)
SATURATION QUALIFICATIONS: (This note is used to comply with regulatory documentation for home oxygen)  Patient Saturations on Room Air at Rest =  82%  Patient Saturations on Room Air while Ambulating = NA %

## 2016-12-13 NOTE — Care Management (Signed)
Patient had overnight oximetry and appears will qualify for nocturnal oxygen.  Staff will perform home 02 assessment to determine if needs continuous.  Patient Alert today.  Attending is planning for discharge .  Notified [facility] who will resume nursing and provide oxygen.

## 2016-12-13 NOTE — Progress Notes (Signed)
[facility]  Patient Status: Active  [facility] is providing the following services: SN  If patient discharges after hours, please call [phone].   Marilynne Drivers [place] 12/13/2016, 9:02 AM

## 2016-12-13 NOTE — Progress Notes (Signed)
A&O, Up with one assist. No complaints. Overnight pulse oximetry performed tonight via cardiopulmonary.

## 2016-12-13 NOTE — Progress Notes (Signed)
Pre-hd tx 

## 2016-12-13 NOTE — Progress Notes (Signed)
Inpatient Diabetes Program Recommendations  AACE/ADA: New Consensus Statement on Inpatient Glycemic Control (2015)  Target Ranges:  Prepandial:   less than 140 mg/dL      Peak postprandial:   less than 180 mg/dL (1-2 hours)      Critically ill patients:  140 - 180 mg/dL   Lab Results  Component Value Date   GLUCAP 125 (H) 12/13/2016   HGBA1C 7.1 (H) 12/11/2016    Review of Glycemic Control  Results for Laurie Steele, Laurie Steele (MRN [id number]) as of 12/13/2016 12:59  Ref. Range 12/12/2016 07:57 12/12/2016 12:30 12/12/2016 17:13 12/12/2016 22:08 12/13/2016 07:53  Glucose-Capillary Latest Ref Range: 65 - 99 mg/dL 125 (H) 137 (H) 279 (H) 129 (H) 125 (H)    Diabetes history: Type 2 Outpatient Diabetes medications: Novolog 2-3 units qhs (" never more than that"), does not take Lantus (long acting) at home  Current orders for Inpatient glycemic control: Lantus 16 units qday, Novolog 0-9 units tid, Novolog 0-5 units qhs  Inpatient Diabetes Program Recommendations:  I met with the patient this morning while she was in dialysis.  She reports that she does not take ANY long acting insulin at bedtime and only takes 2-3 units of Novolog at hs.   Please D/C Lantus insulin- continue Novolog correction insulin as ordered.  Text page to Dr. Earleen Newport at 1:13pm re: recommendations  Gentry Fitz, RN, [facility], [place], CDE Diabetes Coordinator Inpatient Diabetes Program  [phone] (Team Pager) [phone] ([place]) 12/13/2016 1:09 PM

## 2016-12-13 NOTE — Care Management Important Message (Signed)
Important Message  Patient Details  Name: Laurie Steele MRN: [id number] Date of Birth: 1940-12-14   Medicare Important Message Given:  Yes  Initial signed IM printed from Epic and given to patient.Katrina Stack, RN 12/13/2016, 4:45 PM

## 2016-12-13 NOTE — Evaluation (Signed)
Physical Therapy Evaluation Patient Details Name: Laurie Steele MRN: [id number] DOB: 08/31/1941 Today's Date: 12/13/2016   History of Present Illness  Laurie Steele  is a 76 y.o. female with a known history of CAD and medical management, hypertension, back surgeries with chronic back pain, chronic anemia, end-stage renal disease on Tuesday Thursday Saturday hemodialysis, insulin-dependent diabetes mellitus, retinopathy presents to Hospital from home secondary to worsening shortness of breath, cough and chest pain.  Clinical Impression  Pt admitted with above diagnosis. Pt currently with functional limitations due to the deficits listed below (see PT Problem List). Pt is modI for bed mobility but requires minA+1 for transfers and ambulation due to poor balance. She walks with slow, shuffling gait pattern from bed to door and back two separate times with therapist. She has intermittent LOB requiring therapist correction to prevent her from falling. Attempted to obtain exertional sats but with four separate devices unable to get consistent signal. She appears to be at 94% on room air at rest. Denies DOE and no signs of respiratory distress with ambulation. Pt does report feeling whoozy during ambulation like she is going to black. Pt requests returning to bed. This happens consistently during both ambulation attempts. Pt reports significant decline in functional mobility from baseline and she will need SNF placement at discharge in order to return to pre-morbid state. Pt will benefit from skilled PT services to address deficits in strength, balance, and mobility in order to return to full function at home.      Follow Up Recommendations SNF    Equipment Recommendations  None recommended by PT;Other (comment) (TBD further at facility)    Recommendations for Other Services       Precautions / Restrictions Precautions Precautions: Fall Restrictions Weight Bearing Restrictions: No       Mobility  Bed Mobility Overal bed mobility: Modified Independent             General bed mobility comments: Increased time required to perform. HOB elevated and bed rails utilized  Transfers Overall transfer level: Needs assistance Equipment used: Rolling walker (2 wheeled) Transfers: Sit to/from Stand Sit to Stand: Min assist         General transfer comment: Pt with instability during transfers. She falls backwards and supports her legs on bed rail. Improved stability with UE support on walker  Ambulation/Gait Ambulation/Gait assistance: Min assist Ambulation Distance (Feet): 60 Feet (30+30) Assistive device: Rolling walker (2 wheeled) Gait Pattern/deviations: Decreased step length - right;Decreased step length - left;Shuffle Gait velocity: Decreased Gait velocity interpretation: <1.8 ft/sec, indicative of risk for recurrent falls General Gait Details: Pt walks with slow, shuffling gait pattern from bed to door and back two separate times with therapist. She has intermittent LOB requiring therapist correction to prevent her from falling. Attempted to obtain exertional sats but with four separate devices unable to get consistent waveform. She appears to be at 94% on room air at rest. Denies DOE and no signs of respiratory distress. Pt does report feeling whoozy during ambulation like she is going to black out. Pt requests returning to bed. This happens consistently during both ambulation attempts. Pt reports significant decline in functional mobility from baseline  Stairs            Wheelchair Mobility    Modified Rankin (Stroke Patients Only)       Balance Overall balance assessment: Needs assistance Sitting-balance support: No upper extremity supported Sitting balance-Leahy Scale: Good     Standing balance support: No upper  extremity supported Standing balance-Leahy Scale: Fair Standing balance comment: Intermittent LOB during ambulation                              Pertinent Vitals/Pain Pain Assessment: No/denies pain    Home Living Family/patient expects to be discharged to:: Private residence Living Arrangements: Children;Other (Comment) (Son) Available Help at Discharge: Family Type of Home: House Home Access: Level entry     Home Layout: One level Home Equipment: Environmental consultant - 2 wheels;Shower seat;Other (comment) (No home o2)      Prior Function Level of Independence: Needs assistance   Gait / Transfers Assistance Needed: Pt usually has assist from son with bed mobility, transfers, and ambulation  ADL's / Homemaking Assistance Needed: Daughter assist with bathing  Comments: Pt reports daughter assists with bathing and grooming but also has an aide that comes to assist with medications.  Son provides transportation.  Pt reports someone is always around majority of the time and tends to sit around if they leave to run an errand.     Hand Dominance   Dominant Hand: Right    Extremity/Trunk Assessment   Upper Extremity Assessment Upper Extremity Assessment: Generalized weakness    Lower Extremity Assessment Lower Extremity Assessment: Generalized weakness       Communication   Communication: No difficulties  Cognition Arousal/Alertness: Awake/alert Behavior During Therapy: Flat affect Overall Cognitive Status: Within Functional Limits for tasks assessed                      General Comments      Exercises     Assessment/Plan    PT Assessment Patient needs continued PT services  PT Problem List Decreased strength;Decreased activity tolerance;Decreased balance;Decreased mobility;Decreased safety awareness;Cardiopulmonary status limiting activity          PT Treatment Interventions DME instruction;Gait training;Functional mobility training;Therapeutic activities;Therapeutic exercise;Balance training;Neuromuscular re-education;Patient/family education    PT Goals (Current goals can be found in  the Care Plan section)  Acute Rehab PT Goals Patient Stated Goal: Return to baseline functional level PT Goal Formulation: With patient Time For Goal Achievement: 12/27/16 Potential to Achieve Goals: Good    Frequency Min 2X/week   Barriers to discharge        Co-evaluation               End of Session Equipment Utilized During Treatment: Gait belt Activity Tolerance: Other (comment) (Limited by feeling "whoozy" during ambulation) Patient left: in bed Nurse Communication: Mobility status;Other (comment) (Pt feels like she may pass out. VSS)         Time: 0626-9485 PT Time Calculation (min) (ACUTE ONLY): 29 min   Charges:   PT Evaluation $PT Eval Low Complexity: 1 Procedure PT Treatments $Gait Training: 8-22 mins   PT G Codes:       Lyndel Safe Huprich PT, DPT   Huprich,Jason 12/13/2016, 4:32 PM

## 2016-12-13 NOTE — Progress Notes (Signed)
Post hd tx 

## 2016-12-13 NOTE — Progress Notes (Signed)
Post hemodialysis.Tolerated 3hours of treatment with 2liters net removal.Epogen dose given during treatment.Hemodialysis CVC lumens capped and  wrapped with gauze/taped.

## 2016-12-13 NOTE — Progress Notes (Signed)
Hemodialysis completed. 

## 2016-12-13 NOTE — Progress Notes (Signed)
Hemodialysis started

## 2016-12-13 NOTE — Progress Notes (Signed)
Patient ID: Laurie Steele, female   DOB: 29-Jul-1941, 76 y.o.   MRN: [id number]   [facility] PROGRESS NOTE  CULLEN VANALLEN TKW:[id number] DOB: [birth year] DOA: 12/11/2016 PCP: [name], MD  HPI/Subjective: Patient seen after dialysis. She was feeling better with regards to her cough and shortness of breath. When patient walked around she did drop her pulse ox into the 80s. She felt dizzy and lightheaded and almost passed out. Discharge canceled.  Objective: Vitals:   12/13/16 1339 12/13/16 1503  BP: (!) 119/58 (!) 160/56  Pulse: 81 90  Resp:    Temp:      Filed Weights   12/11/16 1603 12/11/16 2019 12/13/16 1314  Weight: 81.6 kg (179 lb 14.3 oz) 81.3 kg (179 lb 3.2 oz) 80 kg (176 lb 5.9 oz)    ROS: Review of Systems  Constitutional: Negative for chills and fever.  Eyes: Negative for blurred vision.  Respiratory: Positive for cough and shortness of breath.   Cardiovascular: Negative for chest pain.  Gastrointestinal: Negative for abdominal pain, constipation, diarrhea, nausea and vomiting.  Genitourinary: Negative for dysuria.  Musculoskeletal: Negative for joint pain.  Neurological: Positive for weakness. Negative for dizziness and headaches.   Exam: Physical Exam  Constitutional: She is oriented to person, place, and time.  HENT:  Nose: No mucosal edema.  Mouth/Throat: No oropharyngeal exudate or posterior oropharyngeal edema.  Eyes: Conjunctivae, EOM and lids are normal. Pupils are equal, round, and reactive to light.  Neck: No JVD present. Carotid bruit is not present. No edema present. No thyroid mass and no thyromegaly present.  Cardiovascular: S1 normal and S2 normal.  Exam reveals no gallop.   No murmur heard. Pulses:      Dorsalis pedis pulses are 2+ on the right side, and 2+ on the left side.  Respiratory: No respiratory distress. She has decreased breath sounds in the right lower field and the left lower field. She has no wheezes. She has no  rhonchi. She has no rales.  GI: Soft. Bowel sounds are normal. There is no tenderness.  Musculoskeletal:       Right shoulder: She exhibits no swelling.       Right ankle: She exhibits swelling.       Left ankle: She exhibits swelling.  Lymphadenopathy:    She has no cervical adenopathy.  Neurological: She is alert and oriented to person, place, and time. No cranial nerve deficit.  Skin: Skin is warm. No rash noted. Nails show no clubbing.  Psychiatric: She has a normal mood and affect.      Data Reviewed: Basic Metabolic Panel:  Recent Labs Lab 12/11/16 0747 12/12/16 0143 12/13/16 0424  NA 140 138 136  K 3.7 3.9 4.2  CL 100* 100* 98*  CO2 24 29 28   GLUCOSE 277* 139* 131*  BUN 46* 30* 49*  CREATININE 7.59* 5.64* 7.80*  CALCIUM 9.6 9.0 8.0*  PHOS  --   --  5.4*   CBC:  Recent Labs Lab 12/11/16 0747 12/12/16 0143 12/13/16 0424  WBC 10.6 7.4 8.6  HGB 10.3* 8.6* 8.2*  HCT 31.8* 25.3* 24.9*  MCV 105.7* 103.4* 105.7*  PLT 226 182 157   Cardiac Enzymes:  Recent Labs Lab 12/11/16 0747 12/11/16 1436 12/11/16 2033 12/12/16 0143  TROPONINI 0.07* 0.18* 0.44* 0.62*    CBG:  Recent Labs Lab 12/12/16 1713 12/12/16 2208 12/13/16 0753 12/13/16 1335 12/13/16 1633  GLUCAP 279* 129* 125* 124* 176*      Scheduled Meds: . allopurinol  100 mg Oral Daily  . aspirin EC  325 mg Oral Daily  . cinacalcet  90 mg Oral Daily  . cloNIDine  0.1 mg Oral BID  . docusate sodium  100 mg Oral BID  . epoetin (EPOGEN/PROCRIT) injection  10,000 Units Intravenous Q T,Th,Sa-HD  . famotidine  20 mg Oral Daily  . ferrous sulfate  325 mg Oral Q breakfast  . fluticasone  2 spray Each Nare Daily  . furosemide  40 mg Oral Daily  . gabapentin  100 mg Oral QHS  . heparin  5,000 Units Subcutaneous Q8H  . hydrALAZINE  50 mg Oral BID  . insulin aspart  0-5 Units Subcutaneous QHS  . insulin aspart  0-9 Units Subcutaneous TID WC  . insulin glargine  10 Units Subcutaneous QHS  .  ipratropium-albuterol  3 mL Nebulization Q6H  . latanoprost  1 drop Both Eyes QHS  . metoprolol succinate  25 mg Oral QHS  . multivitamin  1 tablet Oral Daily  . pantoprazole  40 mg Oral Daily  . pravastatin  40 mg Oral QHS  . sevelamer carbonate  800 mg Oral TID WC    Assessment/Plan:  1. Acute hypoxic respiratory failure. This is likely chronic respiratory failure with pulse ox dropping down into the 80s with ambulation and overnight. Care manager to set up continuous oxygen at home. Continue oxygen supplementation. 2. Acute diastolic congestive heart failure. On oral Lasix and dialysis to remove fluid. Added low-dose beta blocker. Nephrology to consider ACE inhibitor as outpatient 3. Likely sleep apnea. Will need sleep study as outpatient. Overnight pulse oximetry showed that the patient qualifies for oxygen at night at home. 4. Essential hypertension continue clonidine, hydralazine, Toprol. I discontinued Norvasc 5. End-stage renal disease on dialysis. Status post dialysis today 6. Type 2 diabetes mellitus- on sliding scale 7. GERD on Protonix 8. Hyperlipidemia unspecified on pravastatin 9. Diabetic neuropathy on gabapentin 10. History of gout on renally dosed allopurinol  Code Status:     Code Status Orders        Start     Ordered   12/11/16 1345  Full code  Continuous     12/11/16 1344    Code Status History    Date Active Date Inactive Code Status Order ID Comments User Context   07/03/2016  8:15 PM 07/05/2016  7:43 PM Full Code [id number]  Vaughan Basta, MD Inpatient   06/15/2016  7:15 PM 06/18/2016  8:26 PM Full Code [id number]  Harvie Bridge, DO Inpatient   05/21/2016  4:28 PM 05/28/2016  6:11 PM Full Code [id number]  Henreitta Leber, MD Inpatient   04/05/2016  4:14 PM 04/06/2016  9:07 PM Full Code [id number]  Loletha Grayer, MD ED   03/14/2016 11:50 PM 03/16/2016  3:34 PM Full Code [id number]  Max Sane, MD Inpatient     Disposition Plan: Reevaluate tomorrow For  potential discharge. Discharge canceled this evening secondary to feeling dizzy and almost passing out after ambulating  Consultants:  nephrology  Time spent: 30 minutes  [place], [place]

## 2016-12-13 NOTE — Progress Notes (Signed)
[facility]  ROUNDING NOTE   Subjective:   Seen and examined on hemodialysis. Tolerating treatment well. UF goal of 1.5 litres    Objective:  Vital signs in last 24 hours:  Temp:  [97.4 F (36.3 C)-98.6 F (37 C)] 98.3 F (36.8 C) (02/08 0950) Pulse Rate:  [72-84] 75 (02/08 1030) Resp:  [15-25] 25 (02/08 1030) BP: (86-159)/(32-81) 149/46 (02/08 1030) SpO2:  [95 %-100 %] 96 % (02/08 1000)  Weight change:  Filed Weights   12/11/16 0743 12/11/16 1603 12/11/16 2019  Weight: 81.6 kg (180 lb) 81.6 kg (179 lb 14.3 oz) 81.3 kg (179 lb 3.2 oz)    Intake/Output: I/O last 3 completed shifts: In: 120 [P.O.:120] Out: 0    Intake/Output this shift:  No intake/output data recorded.  Physical Exam: General: NAD  Head: Normocephalic, atraumatic. Moist oral mucosal membranes  Eyes: Anicteric, PERRL  Neck: Supple, trachea midline  Lungs:  Clear to auscultation, 2 L [place]  Heart: Regular rate and rhythm  Abdomen:  Soft, nontender,   Extremities: no peripheral edema.  Neurologic: Nonfocal, moving all four extremities  Skin: No lesions  Access: RIJ permcath    Basic Metabolic Panel:  Recent Labs Lab 12/11/16 0747 12/12/16 0143 12/13/16 0424  NA 140 138 136  K 3.7 3.9 4.2  CL 100* 100* 98*  CO2 24 29 28   GLUCOSE 277* 139* 131*  BUN 46* 30* 49*  CREATININE 7.59* 5.64* 7.80*  CALCIUM 9.6 9.0 8.0*  PHOS  --   --  5.4*    Liver Function Tests:  Recent Labs Lab 12/13/16 0424  ALBUMIN 2.9*   No results for input(s): LIPASE, AMYLASE in the last 168 hours. No results for input(s): AMMONIA in the last 168 hours.  CBC:  Recent Labs Lab 12/11/16 0747 12/12/16 0143 12/13/16 0424  WBC 10.6 7.4 8.6  HGB 10.3* 8.6* 8.2*  HCT 31.8* 25.3* 24.9*  MCV 105.7* 103.4* 105.7*  PLT 226 182 157    Cardiac Enzymes:  Recent Labs Lab 12/11/16 0747 12/11/16 1436 12/11/16 2033 12/12/16 0143  TROPONINI 0.07* 0.18* 0.44* 0.62*    BNP: Invalid input(s):  POCBNP  CBG:  Recent Labs Lab 12/12/16 0757 12/12/16 1230 12/12/16 1713 12/12/16 2208 12/13/16 0753  GLUCAP 125* 137* 279* 129* 125*    Microbiology: Results for orders placed or performed during the hospital encounter of 12/11/16  MRSA PCR Screening     Status: None   Collection Time: 12/13/16  8:26 AM  Result Value Ref Range Status   MRSA by PCR NEGATIVE NEGATIVE Final    Comment:        The GeneXpert MRSA Assay (FDA approved for NASAL specimens only), is one component of a comprehensive MRSA colonization surveillance program. It is not intended to diagnose MRSA infection nor to guide or monitor treatment for MRSA infections.     Coagulation Studies: No results for input(s): LABPROT, INR in the last 72 hours.  Urinalysis: No results for input(s): COLORURINE, LABSPEC, PHURINE, GLUCOSEU, HGBUR, BILIRUBINUR, KETONESUR, PROTEINUR, UROBILINOGEN, NITRITE, LEUKOCYTESUR in the last 72 hours.  Invalid input(s): APPERANCEUR    Imaging: No results found.   Medications:    . allopurinol  100 mg Oral Daily  . aspirin EC  325 mg Oral Daily  . cinacalcet  90 mg Oral Daily  . cloNIDine  0.1 mg Oral BID  . docusate sodium  100 mg Oral BID  . epoetin (EPOGEN/PROCRIT) injection  10,000 Units Intravenous Q T,Th,Sa-HD  . famotidine  20 mg Oral Daily  . ferrous sulfate  325 mg Oral Q breakfast  . fluticasone  2 spray Each Nare Daily  . furosemide  40 mg Oral Daily  . gabapentin  100 mg Oral QHS  . heparin  5,000 Units Subcutaneous Q8H  . hydrALAZINE  50 mg Oral BID  . insulin aspart  0-5 Units Subcutaneous QHS  . insulin aspart  0-9 Units Subcutaneous TID WC  . insulin glargine  10 Units Subcutaneous QHS  . ipratropium-albuterol  3 mL Nebulization Q6H  . latanoprost  1 drop Both Eyes QHS  . metoprolol succinate  25 mg Oral QHS  . multivitamin  1 tablet Oral Daily  . pantoprazole  40 mg Oral Daily  . pravastatin  40 mg Oral QHS  . sevelamer carbonate  800 mg Oral TID  WC   acetaminophen **OR** acetaminophen, amitriptyline, dextromethorphan, guaiFENesin, hydrALAZINE, HYDROcodone-acetaminophen, ondansetron **OR** ondansetron (ZOFRAN) IV, polyethylene glycol  Assessment/ Plan:  [name] with ESRD on hemodialysis, diabetes mellitus type II, hypertension, hyperlipidemia, gout, glaucoma, GERD, anemia, coronary artery disease  TTS [facility] [place].   1. End Stage Renal Disease: seen and examined on hemodialysis. Tolerating treatment. UF goal increase to 2 litres   Continue TTS schedule.   2. Hypertension: at goal.  - amlodipine, clonidine, furosemide, hydralazine  3. Secondary Hyperparathyroidism: PTH elevated 695 on 1/23. Phosphorus and calcium at goal.  - cinacalcet - sevelamer with meals  4. Anemia of chronic kidney disease: hemoglobin 8.2 - epo with dialysis.   LOS: 2 Laurie Steele 2/8/201810:40 AM

## 2016-12-14 LAB — GLUCOSE, CAPILLARY
GLUCOSE-CAPILLARY: 144 mg/dL — AB (ref 65–99)
Glucose-Capillary: 187 mg/dL — ABNORMAL HIGH (ref 65–99)

## 2016-12-14 MED ORDER — IPRATROPIUM-ALBUTEROL 0.5-2.5 (3) MG/3ML IN SOLN: 3.0000 mL | Freq: Four times a day (QID) | RESPIRATORY_TRACT | 0 refills | Status: DC | PRN

## 2016-12-14 MED ORDER — IPRATROPIUM-ALBUTEROL 0.5-2.5 (3) MG/3ML IN SOLN: 3.0000 mL | Freq: Four times a day (QID) | RESPIRATORY_TRACT | Status: DC | PRN

## 2016-12-14 NOTE — Discharge Summary (Signed)
[facility] NAME: Laurie Steele    MR#:  [id number]  DATE OF BIRTH:  03-15-1941  DATE OF ADMISSION:  12/11/2016   ADMITTING PHYSICIAN: Gladstone Lighter, MD  DATE OF DISCHARGE: 12/14/2016  PRIMARY CARE PHYSICIAN: [name], MD   ADMISSION DIAGNOSIS:   Shortness of breath [R06.02] Nonspecific chest pain [R07.9]  DISCHARGE DIAGNOSIS:   Active Problems:   CHF (congestive heart failure) ([place])   SECONDARY DIAGNOSIS:   Past Medical History:  Diagnosis Date  . CAD (coronary artery disease)   . Chronic anemia    Dr Inez Pilgrim  . GERD (gastroesophageal reflux disease)   . Glaucoma   . Gout   . Hx of colonic polyps   . Hyperlipidemia   . Hypertension   . Lower back pain   . Miscarriage   . 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 Peritoneal dialysis status ([facility] [place])   . Renal failure    Dr Holley Raring  . 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 Vaginal delivery    x 4    HOSPITAL COURSE:   Laurie Steele  is a 76 y.o. female with a known history of CAD and medical management, hypertension, back surgeries with chronic back pain, chronic anemia, end-stage renal disease on Tuesday Thursday Saturday hemodialysis, insulin-dependent diabetes mellitus, retinopathy presents to Hospital from home secondary to worsening shortness of breath, cough and chest pain.  #1 Acute on chronic diastolic CHF exacerbation- CXR with pulm edema - Improved with dialysis. Patient qualified for home oxygen. If remains compliant- will be discharged on 2L o2 - ECHO showing 45% EF which has dropped from Sept 2017 (EF 70%), has ant hypokinesis noted as well - Stress test as outpatient - Appreciate cardiology consult - continue lasix -Likely will need sleep study as outpatient  #2 Chest pain-  stable angina. resolved - appreciate  cardiology input, recycle troponins - Possible myoview as outpatient - continue cardiac medications  #3 HTN- continue clonidine, hydralazine, metoprolol Lasix and Norvasc  #4 ESRD-Thursday Saturday dialysis schedule. Nephrology has been consulted. Last dialysis yesterday -continue renal supplements  #5 DM- A1c is 7.1. On SSI  #6 Neuropathy-gabapentin    Physical therapy recommended rehab. Patient refused. Will be discharged home with home health.   DISCHARGE CONDITIONS:   Guarded  CONSULTS OBTAINED:   Treatment Team:  Lavonia Dana, MD Teodoro Spray, MD  DRUG ALLERGIES:   Allergies  Allergen Reactions  . Alprazolam Other (See Comments)    Unable to sleep and confusion   DISCHARGE MEDICATIONS:   Allergies as of 12/14/2016      Reactions   Alprazolam Other (See Comments)   Unable to sleep and confusion      Medication List    STOP taking these medications   amLODipine 10 MG tablet Commonly known as:  NORVASC   HYDROcodone-acetaminophen 5-325 MG tablet Commonly known as:  NORCO   insulin glargine 100 UNIT/ML injection Commonly known as:  LANTUS     TAKE these medications   acetaminophen 650 MG CR tablet Commonly known as:  TYLENOL Take 650 mg by mouth every 8 (eight) hours as needed for pain.   allopurinol 100 MG tablet Commonly known as:  ZYLOPRIM Take 1 tablet by mouth daily.   amitriptyline 25 MG tablet Commonly known as:  ELAVIL Take 1 tablet  by mouth at bedtime as needed (For foot pain).   aspirin EC 81 MG tablet Take 1 tablet (81 mg total) by mouth daily. What changed:  how much to take   cinacalcet 90 MG tablet Commonly known as:  SENSIPAR Take 90 mg by mouth daily. With lunch   cloNIDine 0.1 MG tablet Commonly known as:  CATAPRES Take 0.1 mg by mouth 2 (two) times daily.   colchicine 0.6 MG tablet Take 0.5 tablets (0.3 mg total) by mouth 2 (two) times a week.   cyclobenzaprine 5 MG tablet Commonly known as:   FLEXERIL Take by mouth.   diclofenac sodium 1 % Gel Commonly known as:  VOLTAREN Apply 2 g four times a day PRN Not a candidate for oral NSAIDS, ESRD on PD   docusate sodium 100 MG capsule Commonly known as:  COLACE Take 1 capsule (100 mg total) by mouth daily as needed for mild constipation.   famotidine 20 MG tablet Commonly known as:  PEPCID   ferrous sulfate 325 (65 FE) MG tablet Take 1 tablet (325 mg total) by mouth daily with breakfast.   fluticasone 50 MCG/ACT nasal spray Commonly known as:  FLONASE Place 2 sprays into both nostrils daily.   furosemide 40 MG tablet Commonly known as:  LASIX Take 1 tablet (40 mg total) by mouth daily.   gabapentin 100 MG capsule Commonly known as:  NEURONTIN Take 1 capsule (100 mg total) by mouth at bedtime.   hydrALAZINE 50 MG tablet Commonly known as:  APRESOLINE Take 50 mg by mouth 2 (two) times daily.   insulin aspart 100 UNIT/ML injection Commonly known as:  novoLOG Inject 0-5 Units into the skin at bedtime. What changed:  additional instructions   ipratropium 0.03 % nasal spray Commonly known as:  ATROVENT Place 1 spray into both nostrils 3 (three) times daily as needed for rhinitis.   ipratropium-albuterol 0.5-2.5 (3) MG/3ML Soln Commonly known as:  DUONEB Take 3 mLs by nebulization every 6 (six) hours as needed.   latanoprost 0.005 % ophthalmic solution Commonly known as:  XALATAN Place 1 drop into both eyes at bedtime.   metoprolol succinate 25 MG 24 hr tablet Commonly known as:  TOPROL-XL Take 1 tablet (25 mg total) by mouth at bedtime.   multivitamin Tabs tablet Take 1 tablet by mouth daily.   pantoprazole 40 MG tablet Commonly known as:  PROTONIX Take 1 tablet (40 mg total) by mouth daily.   pravastatin 80 MG tablet Commonly known as:  PRAVACHOL Take 40 mg by mouth at bedtime.   sevelamer carbonate 800 MG tablet Commonly known as:  RENVELA Take 800 mg by mouth 3 (three) times daily with meals.             Durable Medical Equipment        Start     Ordered   12/13/16 1434  For home use only DME oxygen  Once    Question Answer Comment  Mode or (Route) Nasal cannula   Liters per Minute 2   Frequency Continuous (stationary and portable oxygen unit needed)   Oxygen conserving device Yes   Oxygen delivery system Gas      12/13/16 1434   12/13/16 1406  For home use only DME oxygen  Once    Question Answer Comment  Mode or (Route) Nasal cannula   Liters per Minute 2   Frequency Only at night (stationary unit needed)   Oxygen conserving device Yes   Oxygen delivery system  Gas      12/13/16 1406       DISCHARGE INSTRUCTIONS:   1. PCP follow-up in 1-2 weeks 2. Cardiology follow up in 3 weeks 3. For dialysis as scheduled tomorrow  DIET:   Renal diet  ACTIVITY:   Activity as tolerated  OXYGEN:   Home Oxygen: Yes.    Oxygen Delivery: 2 liters/min via Patient connected to nasal cannula oxygen  DISCHARGE LOCATION:   home   If you experience worsening of your admission symptoms, develop shortness of breath, life threatening emergency, suicidal or homicidal thoughts you must seek medical attention immediately by calling 911 or calling your MD immediately  if symptoms less severe.  You Must read complete instructions/literature along with all the possible adverse reactions/side effects for all the Medicines you take and that have been prescribed to you. Take any new Medicines after you have completely understood and accpet all the possible adverse reactions/side effects.   Please note  You were cared for by a hospitalist during your hospital stay. If you have any questions about your discharge medications or the care you received while you were in the hospital after you are discharged, you can call the unit and asked to speak with the hospitalist on call if the hospitalist that took care of you is not available. Once you are discharged, your primary care physician  will handle any further medical issues. Please note that NO REFILLS for any discharge medications will be authorized once you are discharged, as it is imperative that you return to your primary care physician (or establish a relationship with a primary care physician if you do not have one) for your aftercare needs so that they can reassess your need for medications and monitor your lab values.    On the day of Discharge:  VITAL SIGNS:   Blood pressure (!) 118/48, pulse 76, temperature 98.6 F (37 C), temperature source Oral, resp. rate 17, height 5\' 7"  (1.702 m), weight 80 kg (176 lb 5.9 oz), SpO2 96 %.  PHYSICAL EXAMINATION:    GENERAL:  76 y.o.-year-old patient lying in the bed with no acute distress. Appears very weak. EYES: Pupils equal, round, reactive to light and accommodation. No scleral icterus. Extraocular muscles intact.  HEENT: Head atraumatic, normocephalic. Oropharynx and nasopharynx clear.  NECK:  Supple, no jugular venous distention. No thyroid enlargement, no tenderness.  LUNGS: Normal breath sounds bilaterally, no wheezing, rales,rhonchi or crepitation. No use of accessory muscles of respiration. Bibasilar crackles heard. CARDIOVASCULAR: S1, S2 normal. No rubs, or gallops. 2/6 systolic murmur present.  Right chest permacath noted. ABDOMEN: Soft, nontender, nondistended. Bowel sounds present. No organomegaly or mass.  EXTREMITIES: No cyanosis, or clubbing. 2+ bilateral pedal edema noted. Left arm fistula noted. NEUROLOGIC: Cranial nerves II through XII are intact. Muscle strength 5/5 in all extremities. Sensation intact. Gait not checked. Global weakness noted. PSYCHIATRIC: The patient is alert and oriented x 3.  SKIN: No obvious rash, lesion, or ulcer.    DATA REVIEW:   CBC  Recent Labs Lab 12/13/16 0424  WBC 8.6  HGB 8.2*  HCT 24.9*  PLT 157    Chemistries   Recent Labs Lab 12/13/16 0424  NA 136  K 4.2  CL 98*  CO2 28  GLUCOSE 131*  BUN 49*   CREATININE 7.80*  CALCIUM 8.0*     Microbiology Results  Results for orders placed or performed during the hospital encounter of 12/11/16  MRSA PCR Screening     Status: None  Collection Time: 12/13/16  8:26 AM  Result Value Ref Range Status   MRSA by PCR NEGATIVE NEGATIVE Final    Comment:        The GeneXpert MRSA Assay (FDA approved for NASAL specimens only), is one component of a comprehensive MRSA colonization surveillance program. It is not intended to diagnose MRSA infection nor to guide or monitor treatment for MRSA infections.     RADIOLOGY:  No results found.   Management plans discussed with the patient, family and they are in agreement.  CODE STATUS:     Code Status Orders        Start     Ordered   12/11/16 1345  Full code  Continuous     12/11/16 1344    Code Status History    Date Active Date Inactive Code Status Order ID Comments User Context   07/03/2016  8:15 PM 07/05/2016  7:43 PM Full Code [id number]  Vaughan Basta, MD Inpatient   06/15/2016  7:15 PM 06/18/2016  8:26 PM Full Code [id number]  Harvie Bridge, DO Inpatient   05/21/2016  4:28 PM 05/28/2016  6:11 PM Full Code [id number]  Henreitta Leber, MD Inpatient   04/05/2016  4:14 PM 04/06/2016  9:07 PM Full Code [id number]  Loletha Grayer, MD ED   03/14/2016 11:50 PM 03/16/2016  3:34 PM Full Code [id number]  Max Sane, MD Inpatient      TOTAL TIME TAKING CARE OF THIS PATIENT: 37 minutes.    Ayman Brull M.D on 12/14/2016 at 1:59 PM  Between 7am to 6pm - Pager - [phone]  After 6pm go to [email or web address] - Proofreader  [facility]  Hospitalists  Office  [phone]  CC: Primary care physician; [name], MD   Note: This dictation was prepared with Dragon dictation along with smaller phrase technology. Any transcriptional errors that result from this process are unintentional.

## 2016-12-14 NOTE — Progress Notes (Signed)
[facility]  ROUNDING NOTE   Subjective:   Husband at bedside.  Hemodialysis yesterday. Uf 2 litres  Objective:  Vital signs in last 24 hours:  Temp:  [98.3 F (36.8 C)-99.1 F (37.3 C)] 98.6 F (37 C) (02/09 1110) Pulse Rate:  [72-90] 76 (02/09 1110) Resp:  [3-24] 17 (02/09 1110) BP: (109-165)/(48-78) 118/48 (02/09 1110) SpO2:  [92 %-100 %] 96 % (02/09 1110) FiO2 (%):  [21 %] 21 % (02/09 0749) Weight:  [80 kg (176 lb 5.9 oz)] 80 kg (176 lb 5.9 oz) (02/08 1314)  Weight change:  Filed Weights   12/11/16 1603 12/11/16 2019 12/13/16 1314  Weight: 81.6 kg (179 lb 14.3 oz) 81.3 kg (179 lb 3.2 oz) 80 kg (176 lb 5.9 oz)    Intake/Output: I/O last 3 completed shifts: In: -  Out: 2000 [Other:2000]   Intake/Output this shift:  Total I/O In: 240 [P.O.:240] Out: 0   Physical Exam: General: NAD  Head: Normocephalic, atraumatic. Moist oral mucosal membranes  Eyes: Anicteric, PERRL  Neck: Supple, trachea midline  Lungs:  Clear to auscultation, room air  Heart: Regular rate and rhythm  Abdomen:  Soft, nontender,   Extremities: no peripheral edema.  Neurologic: Nonfocal, moving all four extremities  Skin: No lesions  Access: RIJ permcath    Basic Metabolic Panel:  Recent Labs Lab 12/11/16 0747 12/12/16 0143 12/13/16 0424  NA 140 138 136  K 3.7 3.9 4.2  CL 100* 100* 98*  CO2 24 29 28   GLUCOSE 277* 139* 131*  BUN 46* 30* 49*  CREATININE 7.59* 5.64* 7.80*  CALCIUM 9.6 9.0 8.0*  PHOS  --   --  5.4*    Liver Function Tests:  Recent Labs Lab 12/13/16 0424  ALBUMIN 2.9*   No results for input(s): LIPASE, AMYLASE in the last 168 hours. No results for input(s): AMMONIA in the last 168 hours.  CBC:  Recent Labs Lab 12/11/16 0747 12/12/16 0143 12/13/16 0424  WBC 10.6 7.4 8.6  HGB 10.3* 8.6* 8.2*  HCT 31.8* 25.3* 24.9*  MCV 105.7* 103.4* 105.7*  PLT 226 182 157    Cardiac Enzymes:  Recent Labs Lab 12/11/16 0747 12/11/16 1436  12/11/16 2033 12/12/16 0143  TROPONINI 0.07* 0.18* 0.44* 0.62*    BNP: Invalid input(s): POCBNP  CBG:  Recent Labs Lab 12/13/16 1335 12/13/16 1633 12/13/16 2125 12/14/16 0730 12/14/16 1122  GLUCAP 124* 176* 161* 144* 187*    Microbiology: Results for orders placed or performed during the hospital encounter of 12/11/16  MRSA PCR Screening     Status: None   Collection Time: 12/13/16  8:26 AM  Result Value Ref Range Status   MRSA by PCR NEGATIVE NEGATIVE Final    Comment:        The GeneXpert MRSA Assay (FDA approved for NASAL specimens only), is one component of a comprehensive MRSA colonization surveillance program. It is not intended to diagnose MRSA infection nor to guide or monitor treatment for MRSA infections.     Coagulation Studies: No results for input(s): LABPROT, INR in the last 72 hours.  Urinalysis: No results for input(s): COLORURINE, LABSPEC, PHURINE, GLUCOSEU, HGBUR, BILIRUBINUR, KETONESUR, PROTEINUR, UROBILINOGEN, NITRITE, LEUKOCYTESUR in the last 72 hours.  Invalid input(s): APPERANCEUR    Imaging: No results found.   Medications:    . allopurinol  100 mg Oral Daily  . aspirin EC  325 mg Oral Daily  . cinacalcet  90 mg Oral Daily  . cloNIDine  0.1 mg Oral BID  .  docusate sodium  100 mg Oral BID  . epoetin (EPOGEN/PROCRIT) injection  10,000 Units Intravenous Q T,Th,Sa-HD  . famotidine  20 mg Oral Daily  . ferrous sulfate  325 mg Oral Q breakfast  . fluticasone  2 spray Each Nare Daily  . furosemide  40 mg Oral Daily  . gabapentin  100 mg Oral QHS  . heparin  5,000 Units Subcutaneous Q8H  . hydrALAZINE  50 mg Oral BID  . insulin aspart  0-5 Units Subcutaneous QHS  . insulin aspart  0-9 Units Subcutaneous TID WC  . latanoprost  1 drop Both Eyes QHS  . metoprolol succinate  25 mg Oral QHS  . multivitamin  1 tablet Oral Daily  . pantoprazole  40 mg Oral Daily  . pravastatin  40 mg Oral QHS  . sevelamer carbonate  800 mg Oral TID  WC   acetaminophen **OR** acetaminophen, amitriptyline, dextromethorphan, guaiFENesin, hydrALAZINE, HYDROcodone-acetaminophen, ondansetron **OR** ondansetron (ZOFRAN) IV, polyethylene glycol  Assessment/ Plan:  Laurie Steele is a 76 y.o. black female with ESRD on hemodialysis, diabetes mellitus type II, hypertension, hyperlipidemia, gout, glaucoma, GERD, anemia, coronary artery disease  TTS [facility].   1. End Stage Renal Disease: seen and examined on hemodialysis. Tolerating treatment. Adjust EDW to 81.5kg.  Continue TTS schedule.   2. Hypertension: at goal.  - amlodipine, clonidine, furosemide, hydralazine - started on toprol on this admission.   3. Secondary Hyperparathyroidism: PTH elevated 695 on 1/23. Phosphorus and calcium at goal.  - cinacalcet - sevelamer with meals  4. Anemia of chronic kidney disease: hemoglobin 8.2 - epo with dialysis.   LOS: [place], [place] 2/9/201812:29 PM

## 2016-12-14 NOTE — Care Management (Signed)
Notified [facility] of discharge.  Notified Cheryl with [facility]

## 2016-12-14 NOTE — Progress Notes (Signed)
Patient is alert and oriented, vss.  No complaints of pain.  Patient walked around nursing station with nursing staff.  02 will be given to patient at discharge.  D/C telemetry and d/c PIV.  Given Rx.  Patient able to repeat back discharge information.  No questions at this time. Patient to be escorted out of hospital via wheelchair by nursing staff.

## 2016-12-14 NOTE — Progress Notes (Signed)
[facility]  SUBJECTIVE: dizzy after hd yesterday with drop in pulse ox and blood pressure. BP and pulse ox improved this am.    Vitals:   12/13/16 2037 12/13/16 2152 12/14/16 0458 12/14/16 0740  BP: (!) 129/49  (!) 109/48 (!) 123/52  Pulse: 80  72 75  Resp: 18  18 16   Temp: 99.1 F (37.3 C)  99.1 F (37.3 C) 98.3 F (36.8 C)  TempSrc: Oral  Oral Oral  SpO2: 94% 94% 95% 94%  Weight:      Height:        Intake/Output Summary (Last 24 hours) at 12/14/16 0807 Last data filed at 12/13/16 1314  Gross per 24 hour  Intake                0 ml  Output             2000 ml  Net            -2000 ml    LABS: Basic Metabolic Panel:  Recent Labs  12/12/16 0143 12/13/16 0424  NA 138 136  K 3.9 4.2  CL 100* 98*  CO2 29 28  GLUCOSE 139* 131*  BUN 30* 49*  CREATININE 5.64* 7.80*  CALCIUM 9.0 8.0*  PHOS  --  5.4*   Liver Function Tests:  Recent Labs  12/13/16 0424  ALBUMIN 2.9*   No results for input(s): LIPASE, AMYLASE in the last 72 hours. CBC:  Recent Labs  12/12/16 0143 12/13/16 0424  WBC 7.4 8.6  HGB 8.6* 8.2*  HCT 25.3* 24.9*  MCV 103.4* 105.7*  PLT 182 157   Cardiac Enzymes:  Recent Labs  12/11/16 1436 12/11/16 2033 12/12/16 0143  TROPONINI 0.18* 0.44* 0.62*   BNP: Invalid input(s): POCBNP D-Dimer: No results for input(s): DDIMER in the last 72 hours. Hemoglobin A1C: No results for input(s): HGBA1C in the last 72 hours. Fasting Lipid Panel: No results for input(s): CHOL, HDL, LDLCALC, TRIG, CHOLHDL, LDLDIRECT in the last 72 hours. Thyroid Function Tests: No results for input(s): TSH, T4TOTAL, T3FREE, THYROIDAB in the last 72 hours.  Invalid input(s): FREET3 Anemia Panel: No results for input(s): VITAMINB12, FOLATE, FERRITIN, TIBC, IRON, RETICCTPCT in the last 72 hours.   Physical Exam: Blood pressure (!) 123/52, pulse 75, temperature 98.3 F (36.8 C), temperature source Oral, resp. rate 16, height 5'  7" (1.702 m), weight 80 kg (176 lb 5.9 oz), SpO2 94 %.   Wt Readings from Last 1 Encounters:  12/13/16 80 kg (176 lb 5.9 oz)     General appearance: cooperative Resp: diminished breath sounds bibasilar Cardio: regular rate and rhythm Extremities: edema edema  TELEMETRY: Reviewed telemetry pt in nsr with intermitent nonsustained wide complex runs up to 4 beats. Somewhat irregular, ventricular run vs abarant svt.   ASSESSMENT AND PLAN:  CHF  Improved clinically with HD. EF 40-45%. Continue with metoprolol succ 25 mg daily, hydralazine, clonidine and furosemide. Low sodium diet and daily weights.   Renal Insufficiency  On HD   CAD  No further chest pain. Would agree with ambulating and if stable, discharge with out patient follow up in our office.      Laurie Spray, MD, [facility] 12/14/2016 8:07 AM

## 2017-01-16 ENCOUNTER — Ambulatory Visit (INDEPENDENT_AMBULATORY_CARE_PROVIDER_SITE_OTHER): Payer: Medicare Other | Admitting: Vascular Surgery

## 2017-01-16 ENCOUNTER — Encounter (INDEPENDENT_AMBULATORY_CARE_PROVIDER_SITE_OTHER): Payer: Self-pay | Admitting: Vascular Surgery

## 2017-01-16 ENCOUNTER — Other Ambulatory Visit (INDEPENDENT_AMBULATORY_CARE_PROVIDER_SITE_OTHER): Payer: Medicare Other

## 2017-01-16 ENCOUNTER — Other Ambulatory Visit (INDEPENDENT_AMBULATORY_CARE_PROVIDER_SITE_OTHER): Payer: Self-pay | Admitting: Vascular Surgery

## 2017-01-16 VITALS — BP 126/62 | HR 67 | Resp 16 | Ht 67.0 in | Wt 180.0 lb

## 2017-01-16 DIAGNOSIS — E782 Mixed hyperlipidemia: Secondary | ICD-10-CM | POA: Diagnosis not present

## 2017-01-16 DIAGNOSIS — T829XXD Unspecified complication of cardiac and vascular prosthetic device, implant and graft, subsequent encounter: Secondary | ICD-10-CM | POA: Diagnosis not present

## 2017-01-16 DIAGNOSIS — N186 End stage renal disease: Secondary | ICD-10-CM

## 2017-01-16 DIAGNOSIS — E118 Type 2 diabetes mellitus with unspecified complications: Secondary | ICD-10-CM

## 2017-01-16 NOTE — Progress Notes (Signed)
Subjective:    Patient ID: Laurie Steele, female    DOB: 1941-03-21, 76 y.o.   MRN: [id number] Chief Complaint  Patient presents with  . Re-evaluation    Ultrasound follow up   Patient referred by [facility] nursing staff for cannulation issues. As per paperwork, [facility] "first team" are unable to cannulate patient - feel patients fistula is not mature enough. Patient is currently being maintained by a right IJ permcath. The patient reports no issues but the inability for staff to cannulate. The patient underwent an HDA which was notable for two hematomas between the fistula and skin. Uniform velocities.    Review of Systems  Constitutional: Negative.   HENT: Negative.   Eyes: Negative.   Respiratory: Negative.   Cardiovascular: Negative.   Gastrointestinal: Negative.   Endocrine: Negative.   Genitourinary:       ESRD  Musculoskeletal: Negative.   Skin: Negative.   Allergic/Immunologic: Negative.   Neurological: Negative.   Hematological: Negative.   Psychiatric/Behavioral: Negative.       Objective:   Physical Exam  Constitutional: She is oriented to person, place, and time. She appears well-developed and well-nourished.  HENT:  Head: Normocephalic and atraumatic.  Right Ear: External ear normal.  Left Ear: External ear normal.  Eyes: Conjunctivae and EOM are normal. Pupils are equal, round, and reactive to light.  Neck: Normal range of motion.  Cardiovascular: Normal rate, regular rhythm and normal heart sounds.   Pulses:      Radial pulses are 2+ on the right side, and 2+ on the left side.  Left Upper Extremity Fistula: Mild edema noted. Non-tender. Faint thrill. Good Bruit.   Pulmonary/Chest: Effort normal and breath sounds normal.  Musculoskeletal: Normal range of motion. She exhibits no edema.  Neurological: She is alert and oriented to person, place, and time.  Skin: Skin is warm and dry.  Psychiatric: She has a normal mood and affect. Her behavior is normal.  Judgment and thought content normal.   BP 126/62 (BP Location: Right Arm)   Pulse 67   Resp 16   Ht 5\' 7"  (1.702 m)   Wt 180 lb (81.6 kg)   BMI 28.19 kg/m   Past Medical History:  Diagnosis Date  . CAD (coronary artery disease)   . Chronic anemia    Dr Inez Pilgrim  . GERD (gastroesophageal reflux disease)   . Glaucoma   . Gout   . Hx of colonic polyps   . Hyperlipidemia   . Hypertension   . Lower back pain   . Miscarriage   . 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 Peritoneal dialysis status ([place])   . Renal failure    Dr Holley Raring  . 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 Vaginal delivery    x 4   Social History   Social History  . Marital status: Widowed    Spouse name: N/A  . Number of children: 4  . Years of education: N/A   Occupational History  . retired Retired    [employer] 2003   Social History Main Topics  . Smoking status: Former Smoker    Quit date: 11/05/1990  . Smokeless tobacco: Never Used  . Alcohol use No  . Drug use: No  . Sexual activity: Not on file   Other Topics Concern  . Not on file  Social History Narrative   Lives at home with son. Has a walker. Dependent for dome daily activities   Past Surgical History:  Procedure Laterality Date  . ABDOMINAL HYSTERECTOMY  1983  . AV FISTULA PLACEMENT Left 09/21/2016   Procedure: ARTERIOVENOUS (AV) FISTULA CREATION ( BRACHIOCEPHALIC );  Surgeon: Katha Cabal, MD;  Location: [facility] ORS;  Service: Vascular;  Laterality: Left;  . BACK SURGERY  6/08   Dr Collier Salina  . BACK SURGERY  1987   disc  . CHOLECYSTECTOMY    . INSERTION OF DIALYSIS CATHETER  2017  . PERITONEAL CATHETER INSERTION    . REMOVAL OF A DIALYSIS CATHETER N/A 09/21/2016   Procedure: REMOVAL OF A DIALYSIS CATHETER ( PERITONEAL DIALYSIS CATH );  Surgeon: Katha Cabal, MD;  Location: [facility] ORS;  Service:  Vascular;  Laterality: N/A;   Family History  Problem Relation Age of Onset  . Heart attack Father   . Hypertension Mother   . Hyperlipidemia Mother   . Coronary artery disease      strong fam hx  . Kidney failure Brother   . Breast cancer Neg Hx    Allergies  Allergen Reactions  . Alprazolam Other (See Comments)    Unable to sleep and confusion      Assessment & Plan:  Patient referred by [facility] nursing staff for cannulation issues. As per paperwork, [facility] "first team" are unable to cannulate patient - feel patients fistula is not mature enough. Patient is currently being maintained by a right IJ permcath. The patient reports no issues but the inability for staff to cannulate. The patient underwent an HDA which was notable for two hematomas between the fistula and skin. Uniform velocities.   1. END STAGE RENAL DISEASE - Stable Recommend letting hematomas resolve before trying to cannulate again. Recommend warm compresses and elevation.  If continue to have issues may have to consider superficialising the fistula. Copy of duplex sent with patient for dialysis.   - VAS US DUPLEX DIALYSIS ACCESS (AVF,AVG); Future  2. Type 2 diabetes mellitus with complication, unspecified long term insulin use status (HCC) - Stable Encouraged good control as its slows the progression of atherosclerotic disease.  3. Mixed hyperlipidemia - Stable Encouraged good control as its slows the progression of atherosclerotic disease.  Current Outpatient Prescriptions on File Prior to Visit  Medication Sig Dispense Refill  . acetaminophen (TYLENOL) 650 MG CR tablet Take 650 mg by mouth every 8 (eight) hours as needed for pain.    Marland Kitchen allopurinol (ZYLOPRIM) 100 MG tablet Take 1 tablet by mouth daily.     Marland Kitchen amitriptyline (ELAVIL) 25 MG tablet Take 1 tablet by mouth at bedtime as needed (For foot pain).   3  . aspirin EC 81 MG tablet Take 1 tablet (81 mg total) by mouth daily. (Patient taking differently: Take  325 mg by mouth daily. ) 30 tablet 1  . cinacalcet (SENSIPAR) 90 MG tablet Take 90 mg by mouth daily. With lunch    . cloNIDine (CATAPRES) 0.1 MG tablet Take 0.1 mg by mouth 2 (two) times daily.    . colchicine 0.6 MG tablet Take 0.5 tablets (0.3 mg total) by mouth 2 (two) times a week. 4 tablet 2  . cyclobenzaprine (FLEXERIL) 5 MG tablet Take by mouth.    . diclofenac sodium (VOLTAREN) 1 % GEL Apply 2 g four times a day PRN Not a candidate for oral NSAIDS, ESRD on PD    . docusate sodium (COLACE) 100 MG  capsule Take 1 capsule (100 mg total) by mouth daily as needed for mild constipation. 30 capsule 0  . famotidine (PEPCID) 20 MG tablet     . ferrous sulfate 325 (65 FE) MG tablet Take 1 tablet (325 mg total) by mouth daily with breakfast. 30 tablet 3  . fluticasone (FLONASE) 50 MCG/ACT nasal spray Place 2 sprays into both nostrils daily.     . furosemide (LASIX) 40 MG tablet Take 1 tablet (40 mg total) by mouth daily. 30 tablet 2  . gabapentin (NEURONTIN) 100 MG capsule Take 1 capsule (100 mg total) by mouth at bedtime. 30 capsule 0  . hydrALAZINE (APRESOLINE) 50 MG tablet Take 50 mg by mouth 2 (two) times daily.     . insulin aspart (NOVOLOG) 100 UNIT/ML injection Inject 0-5 Units into the skin at bedtime. (Patient taking differently: Inject 0-5 Units into the skin at bedtime. If blood sugar is 70-120, no insulin. If 121-250 take 2 units, 251-300 take 3 units, 301-350 take 4 units, 351-400 take 5 units. If over 400 call physician.) 10 mL 11  . ipratropium (ATROVENT) 0.03 % nasal spray Place 1 spray into both nostrils 3 (three) times daily as needed for rhinitis.    Marland Kitchen ipratropium-albuterol (DUONEB) 0.5-2.5 (3) MG/3ML SOLN Take 3 mLs by nebulization every 6 (six) hours as needed. 360 mL 0  . latanoprost (XALATAN) 0.005 % ophthalmic solution Place 1 drop into both eyes at bedtime.  6  . metoprolol succinate (TOPROL-XL) 25 MG 24 hr tablet Take 1 tablet (25 mg total) by mouth at bedtime. 30 tablet 0  .  multivitamin (RENA-VIT) TABS tablet Take 1 tablet by mouth daily.    . pantoprazole (PROTONIX) 40 MG tablet Take 1 tablet (40 mg total) by mouth daily. 30 tablet 1  . pravastatin (PRAVACHOL) 80 MG tablet Take 40 mg by mouth at bedtime.     . sevelamer (RENVELA) 800 MG tablet Take 800 mg by mouth 3 (three) times daily with meals.      No current facility-administered medications on file prior to visit.     There are no Patient Instructions on file for this visit. No Follow-up on file.   Augusten Lipkin A Evadna Donaghy, PA-C

## 2017-01-22 ENCOUNTER — Emergency Department: Payer: Medicare Other

## 2017-01-22 ENCOUNTER — Encounter: Payer: Self-pay | Admitting: Emergency Medicine

## 2017-01-22 ENCOUNTER — Inpatient Hospital Stay
Admission: EM | Admit: 2017-01-22 | Discharge: 2017-01-26 | DRG: 871 | Disposition: A | Discharge status: Skilled Nursing Facility | Payer: Medicare Other | Attending: Internal Medicine | Admitting: Internal Medicine

## 2017-01-22 DIAGNOSIS — I639 Cerebral infarction, unspecified: Secondary | ICD-10-CM

## 2017-01-22 DIAGNOSIS — G9341 Metabolic encephalopathy: Secondary | ICD-10-CM | POA: Diagnosis present

## 2017-01-22 DIAGNOSIS — E11319 Type 2 diabetes mellitus with unspecified diabetic retinopathy without macular edema: Secondary | ICD-10-CM | POA: Diagnosis present

## 2017-01-22 DIAGNOSIS — Z79899 Other long term (current) drug therapy: Secondary | ICD-10-CM | POA: Diagnosis not present

## 2017-01-22 DIAGNOSIS — J111 Influenza due to unidentified influenza virus with other respiratory manifestations: Secondary | ICD-10-CM | POA: Diagnosis present

## 2017-01-22 DIAGNOSIS — I5032 Chronic diastolic (congestive) heart failure: Secondary | ICD-10-CM | POA: Diagnosis present

## 2017-01-22 DIAGNOSIS — I2510 Atherosclerotic heart disease of native coronary artery without angina pectoris: Secondary | ICD-10-CM | POA: Diagnosis present

## 2017-01-22 DIAGNOSIS — Z7982 Long term (current) use of aspirin: Secondary | ICD-10-CM

## 2017-01-22 DIAGNOSIS — E1122 Type 2 diabetes mellitus with diabetic chronic kidney disease: Secondary | ICD-10-CM | POA: Diagnosis present

## 2017-01-22 DIAGNOSIS — J9610 Chronic respiratory failure, unspecified whether with hypoxia or hypercapnia: Secondary | ICD-10-CM | POA: Diagnosis present

## 2017-01-22 DIAGNOSIS — I132 Hypertensive heart and chronic kidney disease with heart failure and with stage 5 chronic kidney disease, or end stage renal disease: Secondary | ICD-10-CM | POA: Diagnosis present

## 2017-01-22 DIAGNOSIS — R531 Weakness: Secondary | ICD-10-CM

## 2017-01-22 DIAGNOSIS — Z87891 Personal history of nicotine dependence: Secondary | ICD-10-CM

## 2017-01-22 DIAGNOSIS — N2581 Secondary hyperparathyroidism of renal origin: Secondary | ICD-10-CM | POA: Diagnosis present

## 2017-01-22 DIAGNOSIS — Z8249 Family history of ischemic heart disease and other diseases of the circulatory system: Secondary | ICD-10-CM

## 2017-01-22 DIAGNOSIS — K219 Gastro-esophageal reflux disease without esophagitis: Secondary | ICD-10-CM | POA: Diagnosis present

## 2017-01-22 DIAGNOSIS — Z90710 Acquired absence of both cervix and uterus: Secondary | ICD-10-CM | POA: Diagnosis not present

## 2017-01-22 DIAGNOSIS — R112 Nausea with vomiting, unspecified: Secondary | ICD-10-CM

## 2017-01-22 DIAGNOSIS — E1151 Type 2 diabetes mellitus with diabetic peripheral angiopathy without gangrene: Secondary | ICD-10-CM | POA: Diagnosis present

## 2017-01-22 DIAGNOSIS — E1140 Type 2 diabetes mellitus with diabetic neuropathy, unspecified: Secondary | ICD-10-CM | POA: Diagnosis present

## 2017-01-22 DIAGNOSIS — R5383 Other fatigue: Secondary | ICD-10-CM

## 2017-01-22 DIAGNOSIS — A419 Sepsis, unspecified organism: Principal | ICD-10-CM | POA: Diagnosis present

## 2017-01-22 DIAGNOSIS — E785 Hyperlipidemia, unspecified: Secondary | ICD-10-CM | POA: Diagnosis present

## 2017-01-22 DIAGNOSIS — Z7984 Long term (current) use of oral hypoglycemic drugs: Secondary | ICD-10-CM

## 2017-01-22 DIAGNOSIS — M109 Gout, unspecified: Secondary | ICD-10-CM | POA: Diagnosis present

## 2017-01-22 DIAGNOSIS — G4730 Sleep apnea, unspecified: Secondary | ICD-10-CM | POA: Diagnosis present

## 2017-01-22 DIAGNOSIS — F0280 Dementia in other diseases classified elsewhere without behavioral disturbance: Secondary | ICD-10-CM | POA: Diagnosis present

## 2017-01-22 DIAGNOSIS — H409 Unspecified glaucoma: Secondary | ICD-10-CM | POA: Diagnosis present

## 2017-01-22 DIAGNOSIS — D631 Anemia in chronic kidney disease: Secondary | ICD-10-CM | POA: Diagnosis present

## 2017-01-22 DIAGNOSIS — Z992 Dependence on renal dialysis: Secondary | ICD-10-CM | POA: Diagnosis not present

## 2017-01-22 DIAGNOSIS — N390 Urinary tract infection, site not specified: Secondary | ICD-10-CM | POA: Diagnosis present

## 2017-01-22 DIAGNOSIS — R740 Nonspecific elevation of levels of transaminase and lactic acid dehydrogenase [LDH]: Secondary | ICD-10-CM

## 2017-01-22 DIAGNOSIS — R7402 Elevation of levels of lactic acid dehydrogenase (LDH): Secondary | ICD-10-CM

## 2017-01-22 DIAGNOSIS — N186 End stage renal disease: Secondary | ICD-10-CM | POA: Diagnosis present

## 2017-01-22 LAB — URINALYSIS, ROUTINE W REFLEX MICROSCOPIC
Bilirubin Urine: NEGATIVE
Glucose, UA: 50 mg/dL — AB
KETONES UR: NEGATIVE mg/dL
Nitrite: NEGATIVE
PH: 6 (ref 5.0–8.0)
Specific Gravity, Urine: 1.015 (ref 1.005–1.030)

## 2017-01-22 LAB — COMPREHENSIVE METABOLIC PANEL
ALBUMIN: 3.3 g/dL — AB (ref 3.5–5.0)
ALK PHOS: 85 U/L (ref 38–126)
ALT: 39 U/L (ref 14–54)
AST: 73 U/L — AB (ref 15–41)
Anion gap: 14 (ref 5–15)
BILIRUBIN TOTAL: 0.9 mg/dL (ref 0.3–1.2)
BUN: 19 mg/dL (ref 6–20)
CALCIUM: 8.5 mg/dL — AB (ref 8.9–10.3)
CO2: 25 mmol/L (ref 22–32)
CREATININE: 4.28 mg/dL — AB (ref 0.44–1.00)
Chloride: 96 mmol/L — ABNORMAL LOW (ref 101–111)
GFR calc Af Amer: 11 mL/min — ABNORMAL LOW (ref >60)
GFR calc non Af Amer: 9 mL/min — ABNORMAL LOW (ref >60)
Glucose, Bld: 212 mg/dL — ABNORMAL HIGH (ref 65–99)
Potassium: 3.1 mmol/L — ABNORMAL LOW (ref 3.5–5.1)
SODIUM: 135 mmol/L (ref 135–145)
Total Protein: 6.9 g/dL (ref 6.5–8.1)

## 2017-01-22 LAB — PROTIME-INR
INR: 1.02
Prothrombin Time: 13.4 seconds (ref 11.4–15.2)

## 2017-01-22 LAB — CBC WITH DIFFERENTIAL/PLATELET
Basophils Absolute: 0 10*3/uL (ref 0–0.1)
Basophils Relative: 0 %
EOS PCT: 2 %
Eosinophils Absolute: 0.1 10*3/uL (ref 0–0.7)
HEMATOCRIT: 31.9 % — AB (ref 35.0–47.0)
Hemoglobin: 10.4 g/dL — ABNORMAL LOW (ref 12.0–16.0)
LYMPHS ABS: 2.6 10*3/uL (ref 1.0–3.6)
LYMPHS PCT: 39 %
MCH: 35.3 pg — AB (ref 26.0–34.0)
MCHC: 32.8 g/dL (ref 32.0–36.0)
MCV: 107.6 fL — AB (ref 80.0–100.0)
Monocytes Absolute: 0.7 10*3/uL (ref 0.2–0.9)
Monocytes Relative: 11 %
Neutro Abs: 3.3 10*3/uL (ref 1.4–6.5)
Neutrophils Relative %: 48 %
PLATELETS: 206 10*3/uL (ref 150–440)
RBC: 2.96 MIL/uL — AB (ref 3.80–5.20)
RDW: 17.9 % — ABNORMAL HIGH (ref 11.5–14.5)
WBC: 6.9 10*3/uL (ref 3.6–11.0)

## 2017-01-22 LAB — INFLUENZA PANEL BY PCR (TYPE A & B)
INFLAPCR: NEGATIVE
INFLBPCR: POSITIVE — AB

## 2017-01-22 LAB — GLUCOSE, CAPILLARY
Glucose-Capillary: 169 mg/dL — ABNORMAL HIGH (ref 65–99)
Glucose-Capillary: 181 mg/dL — ABNORMAL HIGH (ref 65–99)

## 2017-01-22 LAB — LACTIC ACID, PLASMA
Lactic Acid, Venous: 3.6 mmol/L (ref 0.5–1.9)
Lactic Acid, Venous: 3.3 mmol/L (ref 0.5–1.9)

## 2017-01-22 MED ORDER — CLONIDINE HCL 0.1 MG PO TABS
0.1000 mg | ORAL_TABLET | Freq: Two times a day (BID) | ORAL | Status: DC
  Administered 2017-01-23 – 2017-01-25 (×4): 0.1 mg via ORAL
  Filled 2017-01-22 (×6): qty 1

## 2017-01-22 MED ORDER — ONDANSETRON HCL 4 MG PO TABS: 4.0000 mg | ORAL_TABLET | Freq: Four times a day (QID) | ORAL | Status: DC | PRN

## 2017-01-22 MED ORDER — ALLOPURINOL 100 MG PO TABS
100.0000 mg | ORAL_TABLET | Freq: Every day | ORAL | Status: DC
  Administered 2017-01-23 – 2017-01-26 (×4): 100 mg via ORAL
  Filled 2017-01-22 (×4): qty 1

## 2017-01-22 MED ORDER — HYDRALAZINE HCL 50 MG PO TABS
50.0000 mg | ORAL_TABLET | Freq: Two times a day (BID) | ORAL | Status: DC
  Administered 2017-01-23 – 2017-01-25 (×4): 50 mg via ORAL
  Filled 2017-01-22 (×5): qty 1

## 2017-01-22 MED ORDER — ISOSORBIDE MONONITRATE ER 60 MG PO TB24
30.0000 mg | ORAL_TABLET | Freq: Two times a day (BID) | ORAL | Status: DC
  Administered 2017-01-23 – 2017-01-25 (×5): 30 mg via ORAL
  Filled 2017-01-22 (×5): qty 1

## 2017-01-22 MED ORDER — PIPERACILLIN-TAZOBACTAM 3.375 G IVPB 30 MIN
3.3750 g | Freq: Once | INTRAVENOUS | Status: AC
Start: 2017-01-22 — End: 2017-01-22
  Administered 2017-01-22: 3.375 g via INTRAVENOUS
  Filled 2017-01-22: qty 50

## 2017-01-22 MED ORDER — SEVELAMER CARBONATE 800 MG PO TABS
800.0000 mg | ORAL_TABLET | Freq: Three times a day (TID) | ORAL | Status: DC
  Administered 2017-01-23 (×3): 800 mg via ORAL
  Filled 2017-01-22 (×3): qty 1

## 2017-01-22 MED ORDER — POLYETHYLENE GLYCOL 3350 17 G PO PACK: 17.0000 g | PACK | Freq: Every day | ORAL | Status: DC | PRN

## 2017-01-22 MED ORDER — DOCUSATE SODIUM 100 MG PO CAPS
100.0000 mg | ORAL_CAPSULE | Freq: Two times a day (BID) | ORAL | Status: DC
  Administered 2017-01-23 – 2017-01-26 (×6): 100 mg via ORAL
  Filled 2017-01-22 (×6): qty 1

## 2017-01-22 MED ORDER — OSELTAMIVIR PHOSPHATE 30 MG PO CAPS
30.0000 mg | ORAL_CAPSULE | Freq: Once | ORAL | Status: AC
  Administered 2017-01-22: 30 mg via ORAL
  Filled 2017-01-22: qty 1

## 2017-01-22 MED ORDER — CINACALCET HCL 30 MG PO TABS
90.0000 mg | ORAL_TABLET | Freq: Every day | ORAL | Status: DC
Start: 2017-01-23 — End: 2017-01-26
  Administered 2017-01-23 – 2017-01-26 (×4): 90 mg via ORAL
  Filled 2017-01-22 (×5): qty 3

## 2017-01-22 MED ORDER — ALBUTEROL SULFATE (2.5 MG/3ML) 0.083% IN NEBU: 2.5000 mg | INHALATION_SOLUTION | RESPIRATORY_TRACT | Status: DC | PRN

## 2017-01-22 MED ORDER — PRAVASTATIN SODIUM 40 MG PO TABS
40.0000 mg | ORAL_TABLET | Freq: Every day | ORAL | Status: DC
  Administered 2017-01-23 – 2017-01-24 (×2): 40 mg via ORAL
  Filled 2017-01-22 (×3): qty 1

## 2017-01-22 MED ORDER — HEPARIN SODIUM (PORCINE) 5000 UNIT/ML IJ SOLN
5000.0000 [IU] | Freq: Three times a day (TID) | INTRAMUSCULAR | Status: DC
  Administered 2017-01-23 – 2017-01-26 (×9): 5000 [IU] via SUBCUTANEOUS
  Filled 2017-01-22 (×9): qty 1

## 2017-01-22 MED ORDER — IPRATROPIUM-ALBUTEROL 0.5-2.5 (3) MG/3ML IN SOLN
3.0000 mL | Freq: Four times a day (QID) | RESPIRATORY_TRACT | Status: DC | PRN
Start: 2017-01-22 — End: 2017-01-26

## 2017-01-22 MED ORDER — GABAPENTIN 100 MG PO CAPS
100.0000 mg | ORAL_CAPSULE | Freq: Every day | ORAL | Status: DC
  Administered 2017-01-23 – 2017-01-25 (×3): 100 mg via ORAL
  Filled 2017-01-22 (×3): qty 1

## 2017-01-22 MED ORDER — SODIUM CHLORIDE 0.9 % IV SOLN: 250.0000 mL | INTRAVENOUS | Status: DC | PRN

## 2017-01-22 MED ORDER — ACETAMINOPHEN 650 MG RE SUPP: 650.0000 mg | Freq: Four times a day (QID) | RECTAL | Status: DC | PRN

## 2017-01-22 MED ORDER — SODIUM CHLORIDE 0.9 % IV BOLUS (SEPSIS)
500.0000 mL | Freq: Once | INTRAVENOUS | Status: AC
  Administered 2017-01-22: 500 mL via INTRAVENOUS

## 2017-01-22 MED ORDER — ASPIRIN EC 81 MG PO TBEC
81.0000 mg | DELAYED_RELEASE_TABLET | Freq: Every day | ORAL | Status: DC
  Administered 2017-01-23 – 2017-01-26 (×4): 81 mg via ORAL
  Filled 2017-01-22 (×4): qty 1

## 2017-01-22 MED ORDER — ACETAMINOPHEN 325 MG PO TABS
650.0000 mg | ORAL_TABLET | Freq: Four times a day (QID) | ORAL | Status: DC | PRN
  Administered 2017-01-22 – 2017-01-23 (×2): 650 mg via ORAL
  Filled 2017-01-22 (×2): qty 2

## 2017-01-22 MED ORDER — VANCOMYCIN HCL 500 MG IV SOLR
500.0000 mg | Freq: Once | INTRAVENOUS | Status: AC
  Administered 2017-01-23: 500 mg via INTRAVENOUS
  Filled 2017-01-22: qty 500

## 2017-01-22 MED ORDER — VANCOMYCIN HCL IN DEXTROSE 1-5 GM/200ML-% IV SOLN
1000.0000 mg | Freq: Once | INTRAVENOUS | Status: AC
  Administered 2017-01-22: 1000 mg via INTRAVENOUS
  Filled 2017-01-22: qty 200

## 2017-01-22 MED ORDER — SODIUM CHLORIDE 0.9% FLUSH
3.0000 mL | Freq: Two times a day (BID) | INTRAVENOUS | Status: DC
  Administered 2017-01-23 – 2017-01-26 (×8): 3 mL via INTRAVENOUS

## 2017-01-22 MED ORDER — METOPROLOL SUCCINATE ER 50 MG PO TB24
25.0000 mg | ORAL_TABLET | Freq: Every day | ORAL | Status: DC
  Administered 2017-01-23 – 2017-01-25 (×3): 25 mg via ORAL
  Filled 2017-01-22 (×3): qty 1

## 2017-01-22 MED ORDER — FUROSEMIDE 40 MG PO TABS
40.0000 mg | ORAL_TABLET | Freq: Every day | ORAL | Status: DC
  Administered 2017-01-23 – 2017-01-26 (×4): 40 mg via ORAL
  Filled 2017-01-22 (×4): qty 1

## 2017-01-22 MED ORDER — PIPERACILLIN-TAZOBACTAM 3.375 G IVPB: 3.3750 g | Freq: Two times a day (BID) | INTRAVENOUS | Status: DC

## 2017-01-22 MED ORDER — VANCOMYCIN HCL IN DEXTROSE 750-5 MG/150ML-% IV SOLN: 750.0000 mg | INTRAVENOUS | Status: DC

## 2017-01-22 MED ORDER — INSULIN ASPART 100 UNIT/ML ~~LOC~~ SOLN: 0.0000 [IU] | Freq: Three times a day (TID) | SUBCUTANEOUS | Status: DC

## 2017-01-22 MED ORDER — PANTOPRAZOLE SODIUM 40 MG PO TBEC
40.0000 mg | DELAYED_RELEASE_TABLET | Freq: Every day | ORAL | Status: DC
  Administered 2017-01-23 – 2017-01-26 (×4): 40 mg via ORAL
  Filled 2017-01-22 (×4): qty 1

## 2017-01-22 MED ORDER — POTASSIUM CHLORIDE 20 MEQ PO PACK
PACK | ORAL | Status: AC
  Filled 2017-01-22: qty 2

## 2017-01-22 MED ORDER — FLUTICASONE PROPIONATE 50 MCG/ACT NA SUSP
2.0000 | Freq: Every day | NASAL | Status: DC
  Administered 2017-01-23 – 2017-01-25 (×2): 2 via NASAL
  Filled 2017-01-22: qty 16

## 2017-01-22 MED ORDER — SODIUM CHLORIDE 0.9% FLUSH: 3.0000 mL | INTRAVENOUS | Status: DC | PRN

## 2017-01-22 MED ORDER — POTASSIUM CHLORIDE 20 MEQ PO PACK
40.0000 meq | PACK | Freq: Once | ORAL | Status: AC
  Administered 2017-01-22: 40 meq via ORAL

## 2017-01-22 MED ORDER — COLCHICINE 0.6 MG PO TABS
0.3000 mg | ORAL_TABLET | ORAL | Status: DC
  Administered 2017-01-24: 0.3 mg via ORAL
  Filled 2017-01-22: qty 0.5

## 2017-01-22 MED ORDER — ONDANSETRON HCL 4 MG/2ML IJ SOLN
4.0000 mg | Freq: Four times a day (QID) | INTRAMUSCULAR | Status: DC | PRN
  Administered 2017-01-24: 4 mg via INTRAVENOUS
  Filled 2017-01-22: qty 2

## 2017-01-22 MED ORDER — ONDANSETRON HCL 4 MG/2ML IJ SOLN
4.0000 mg | Freq: Once | INTRAMUSCULAR | Status: AC
  Administered 2017-01-22: 4 mg via INTRAVENOUS
  Filled 2017-01-22: qty 2

## 2017-01-22 MED ORDER — OSELTAMIVIR PHOSPHATE 30 MG PO CAPS
30.0000 mg | ORAL_CAPSULE | ORAL | Status: DC
  Administered 2017-01-24: 30 mg via ORAL
  Filled 2017-01-22: qty 1

## 2017-01-22 NOTE — ED Notes (Signed)
Patient asked for and received a warm blanket after VS were rechecked.

## 2017-01-22 NOTE — ED Notes (Signed)
Patient has had several episodes of small white, frothy emesis. Patient came to the room with tan, solid emesis on clothes. Patient had one episode of moderate amount of tan emesis in the room. Dr. Burlene Arnt aware.

## 2017-01-22 NOTE — ED Provider Notes (Signed)
[facility] Emergency Department Provider Note  ____________________________________________   I have reviewed the triage vital signs and the nursing notes.   HISTORY  Chief Complaint Altered Mental Status    HPI Laurie Steele is a 76 y.o. female who presents today complaining of feeling unwell. Very vague history from patient and family. She did have dialysis today. Not known to have a fever. Has been vomiting. Has had some cough. There is no reported focal deficit of from a neurologic point of view despite triage note. In the status with the family tells me. It is just that she doesn't feel right. Patient denies abdominal pain. She is able to give some history she states she's been vomiting today and having diarrhea not feeling well for the last few days. Nonproductive cough. Did not know she had a fever.      Past Medical History:  Diagnosis Date  . CAD (coronary artery disease)   . Chronic anemia    Dr Inez Pilgrim  . GERD (gastroesophageal reflux disease)   . Glaucoma   . Gout   . Hx of colonic polyps   . Hyperlipidemia   . Hypertension   . Lower back pain   . Miscarriage   . 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 Peritoneal dialysis status ([place])   . Renal failure    Dr Holley Raring  . 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 Vaginal delivery    x 4    Patient Active Problem List   Diagnosis Date Noted  . CHF (congestive heart failure) ([place]) 12/11/2016  . Ds DNA antibody positive 07/09/2016  . Pneumonia 07/03/2016  . Anemia 06/17/2016  . Symptomatic anemia 06/15/2016  . Hypoglycemia 05/21/2016  . Chest pain 04/05/2016  . Hypercalcemia 03/14/2016  . Bronchitis 03/03/2012  . Type II or unspecified type diabetes mellitus with renal manifestations, not stated as uncontrolled(250.40) 12/18/2011  . END STAGE RENAL DISEASE  10/23/2010  . DM (diabetes mellitus), type 2 ([place]) 12/13/2009  . NEUROPATHY 12/13/2009  . GOUT 02/15/2009  . SLEEP DISORDER 02/15/2009  . GERD 02/23/2008  . Hyperlipidemia 05/14/2007  . ANEMIA, CHRONIC 05/14/2007  . Essential hypertension 05/14/2007    Past Surgical History:  Procedure Laterality Date  . ABDOMINAL HYSTERECTOMY  1983  . AV FISTULA PLACEMENT Left 09/21/2016   Procedure: ARTERIOVENOUS (AV) FISTULA CREATION ( BRACHIOCEPHALIC );  Surgeon: Katha Cabal, MD;  Location: [facility] ORS;  Service: Vascular;  Laterality: Left;  . BACK SURGERY  6/08   Dr Collier Salina  . BACK SURGERY  1987   disc  . CHOLECYSTECTOMY    . INSERTION OF DIALYSIS CATHETER  2017  . PERITONEAL CATHETER INSERTION    . REMOVAL OF A DIALYSIS CATHETER N/A 09/21/2016   Procedure: REMOVAL OF A DIALYSIS CATHETER ( PERITONEAL DIALYSIS CATH );  Surgeon: Katha Cabal, MD;  Location: [facility] ORS;  Service: Vascular;  Laterality: N/A;    Prior to Admission medications   Medication Sig Start Date End Date Taking? Authorizing Provider  acetaminophen (TYLENOL) 650 MG CR tablet Take 650 mg by mouth every 8 (eight) hours as needed for pain.   Yes Historical Provider, MD  allopurinol (ZYLOPRIM) 100 MG tablet Take 1 tablet by mouth daily.     Historical Provider, MD  amitriptyline (ELAVIL) 25 MG tablet Take 1 tablet by mouth at bedtime as  needed (For foot pain).  01/02/16   Historical Provider, MD  amLODipine (NORVASC) 10 MG tablet Take 10 mg by mouth daily. 11/03/16   Historical Provider, MD  aspirin EC 81 MG tablet Take 1 tablet (81 mg total) by mouth daily. Patient taking differently: Take 325 mg by mouth daily.  06/18/16   Fritzi Mandes, MD  cinacalcet (SENSIPAR) 90 MG tablet Take 90 mg by mouth daily. With lunch    Historical Provider, MD  cloNIDine (CATAPRES) 0.1 MG tablet Take 0.1 mg by mouth 2 (two) times daily. 11/08/16   Historical Provider, MD  colchicine 0.6 MG tablet Take 0.5 tablets (0.3 mg total) by mouth 2  (two) times a week. 07/09/16   Demetrios Loll, MD  cyclobenzaprine (FLEXERIL) 5 MG tablet Take by mouth.    Historical Provider, MD  diclofenac sodium (VOLTAREN) 1 % GEL Apply 2 g four times a day PRN Not a candidate for oral NSAIDS, ESRD on PD 06/27/16   Historical Provider, MD  docusate sodium (COLACE) 100 MG capsule Take 1 capsule (100 mg total) by mouth daily as needed for mild constipation. 12/13/16   Loletha Grayer, MD  famotidine (PEPCID) 20 MG tablet  11/29/16   Historical Provider, MD  ferrous sulfate 325 (65 FE) MG tablet Take 1 tablet (325 mg total) by mouth daily with breakfast. 06/18/16   Fritzi Mandes, MD  fluticasone (FLONASE) 50 MCG/ACT nasal spray Place 2 sprays into both nostrils daily.     Historical Provider, MD  furosemide (LASIX) 40 MG tablet Take 1 tablet (40 mg total) by mouth daily. 07/05/16   Demetrios Loll, MD  gabapentin (NEURONTIN) 100 MG capsule Take 1 capsule (100 mg total) by mouth at bedtime. 07/05/16   Demetrios Loll, MD  hydrALAZINE (APRESOLINE) 50 MG tablet Take 50 mg by mouth 2 (two) times daily.  11/29/16   Historical Provider, MD  insulin aspart (NOVOLOG) 100 UNIT/ML injection Inject 0-5 Units into the skin at bedtime. Patient taking differently: Inject 0-5 Units into the skin at bedtime. If blood sugar is 70-120, no insulin. If 121-250 take 2 units, 251-300 take 3 units, 301-350 take 4 units, 351-400 take 5 units. If over 400 call physician. 05/28/16   Fritzi Mandes, MD  ipratropium (ATROVENT) 0.03 % nasal spray Place 1 spray into both nostrils 3 (three) times daily as needed for rhinitis.    Historical Provider, MD  ipratropium-albuterol (DUONEB) 0.5-2.5 (3) MG/3ML SOLN Take 3 mLs by nebulization every 6 (six) hours as needed. 12/14/16   Gladstone Lighter, MD  isosorbide mononitrate (IMDUR) 30 MG 24 hr tablet Take 30 mg by mouth 2 (two) times daily. 11/03/16   Historical Provider, MD  latanoprost (XALATAN) 0.005 % ophthalmic solution Place 1 drop into both eyes at bedtime. 03/20/16   Historical  Provider, MD  metoprolol succinate (TOPROL-XL) 25 MG 24 hr tablet Take 1 tablet (25 mg total) by mouth at bedtime. 12/13/16   Loletha Grayer, MD  multivitamin (RENA-VIT) TABS tablet Take 1 tablet by mouth daily.    Historical Provider, MD  pantoprazole (PROTONIX) 40 MG tablet Take 1 tablet (40 mg total) by mouth daily. 06/18/16   Fritzi Mandes, MD  pravastatin (PRAVACHOL) 80 MG tablet Take 40 mg by mouth at bedtime.  06/21/15   Historical Provider, MD  sevelamer (RENVELA) 800 MG tablet Take 800 mg by mouth 3 (three) times daily with meals.     Historical Provider, MD    Allergies Alprazolam  Family History  Problem Relation Age of  Onset  . Heart attack Father   . Hypertension Mother   . Hyperlipidemia Mother   . Coronary artery disease      strong fam hx  . Kidney failure Brother   . Breast cancer Neg Hx     Social History Social History  Substance Use Topics  . Smoking status: Former Smoker    Quit date: 11/05/1990  . Smokeless tobacco: Never Used  . Alcohol use No    Review of Systems Constitutional: No fever/chills Eyes: No visual changes. ENT: No sore throat. No stiff neck no neck pain Cardiovascular: Denies chest pain. Respiratory: Denies shortness of breath. Gastrointestinal:   Positive vomiting.  Positive diarrhea.  No constipation. Genitourinary: Negative for dysuria. Musculoskeletal: Negative lower extremity swelling Skin: Negative for rash. Neurological: Negative for severe headaches, focal weakness or numbness. 10-point ROS otherwise negative.  ____________________________________________   PHYSICAL EXAM:  VITAL SIGNS: ED Triage Vitals  Enc Vitals Group     BP 01/22/17 1631 (!) 176/85     Pulse Rate 01/22/17 1631 (!) 108     Resp 01/22/17 1631 (!) 26     Temp 01/22/17 1631 (!) 100.5 F (38.1 C)     Temp Source 01/22/17 1631 Oral     SpO2 01/22/17 1631 100 %     Weight 01/22/17 1635 180 lb (81.6 kg)     Height 01/22/17 1635 5\' 7"  (1.702 m)     Head  Circumference --      Peak Flow --      Pain Score --      Pain Loc --      Pain Edu? --      Excl. in [place]? --     Constitutional: Alert and oriented. Ill-appearing woman who is somewhat lethargic and initially poorly responsive to exam but does not appear to be in danger of imminent intubation Eyes: Conjunctivae are normal. PERRL. EOMI. Head: Atraumatic. Nose: No congestion/rhinnorhea. Mouth/Throat: Mucous membranes are moist.  Oropharynx non-erythematous. Neck: No stridor.   Nontender with no meningismus Cardiovascular: A cardiac noted, port in the right chest noted, regular rhythm. Grossly normal heart sounds.  Good peripheral circulation. Respiratory: Normal respiratory effort.  No retractions. Lungs CTAB. Abdominal: Soft and nontender. No distention. No guarding no rebound Back:  There is no focal tenderness or step off.  there is no midline tenderness there are no lesions noted. there is no CVA tenderness Musculoskeletal: No lower extremity tenderness, no upper extremity tenderness. No joint effusions, no DVT signs strong distal pulses no edema Neurologic:  Normal speech and language. No gross focal neurologic deficits are appreciated.  Skin:  Skin is warm, dry and intact. No rash noted. Psychiatric: Mood and affect are normal. Speech and behavior are normal.  ____________________________________________   LABS (all labs ordered are listed, but only abnormal results are displayed)  Labs Reviewed  CBC WITH DIFFERENTIAL/PLATELET - Abnormal; Notable for the following:       Result Value   RBC 2.96 (*)    Hemoglobin 10.4 (*)    HCT 31.9 (*)    MCV 107.6 (*)    MCH 35.3 (*)    RDW 17.9 (*)    All other components within normal limits  URINALYSIS, ROUTINE W REFLEX MICROSCOPIC - Abnormal; Notable for the following:    Color, Urine YELLOW (*)    APPearance CLOUDY (*)    Glucose, UA 50 (*)    Hgb urine dipstick SMALL (*)    Protein, ur >=300 (*)  Leukocytes, UA LARGE (*)     Bacteria, UA RARE (*)    Squamous Epithelial / LPF 0-5 (*)    All other components within normal limits  GLUCOSE, CAPILLARY - Abnormal; Notable for the following:    Glucose-Capillary 169 (*)    All other components within normal limits  LACTIC ACID, PLASMA - Abnormal; Notable for the following:    Lactic Acid, Venous 3.6 (*)    All other components within normal limits  CULTURE, BLOOD (ROUTINE X 2)  CULTURE, BLOOD (ROUTINE X 2)  INFLUENZA PANEL BY PCR (TYPE A & B)  LACTIC ACID, PLASMA  PROTIME-INR  COMPREHENSIVE METABOLIC PANEL  CBG MONITORING, ED   ____________________________________________  EKG  I personally interpreted any EKGs ordered by me or triage Sinus tach rate 112, no acute ischemic changes not suggestive changes noted ____________________________________________  RADIOLOGY  I reviewed any imaging ordered by me or triage that were performed during my shift and, if possible, patient and/or family made aware of any abnormal findings. ____________________________________________   PROCEDURES  Procedure(s) performed: None  Procedures  Critical Care performed: CRITICAL CARE Performed by: Schuyler Amor   Total critical care time: 42 minutes  Critical care time was exclusive of separately billable procedures and treating other patients.  Critical care was necessary to treat or prevent imminent or life-threatening deterioration.  Critical care was time spent personally by me on the following activities: development of treatment plan with patient and/or surrogate as well as nursing, discussions with consultants, evaluation of patient's response to treatment, examination of patient, obtaining history from patient or surrogate, ordering and performing treatments and interventions, ordering and review of laboratory studies, ordering and review of radiographic studies, pulse oximetry and re-evaluation of patient's  condition.   ____________________________________________   INITIAL IMPRESSION / ASSESSMENT AND PLAN / ED COURSE  Pertinent labs & imaging results that were available during my care of the patient were reviewed by me and considered in my medical decision making (see chart for details).  Patient with fever and altered mental status, after fluids, she is feeling and looking much better. She is nonfocal. She does have a source in infected urine. White count is normal but lactic acid is elevated. We are giving her IV fluid. Patient likely early urosepsis. I am reluctant to give her 2.5 L of fluid as dictated by the sepsis particle given her dialysis status. We are giving her therefore 500 mL boluses sequentially. After 1 L she is looking much better we'll give her another 500 bolus. She may need more as time goes by that we are trying to do this clinically and not by protocol given her dialysis. Hospitalist agrees with management and will admit. Patient is appearing less toxic and much more awake and alert at this time    ____________________________________________   FINAL CLINICAL IMPRESSION(S) / ED DIAGNOSES  Final diagnoses:  None      This chart was dictated using voice recognition software.  Despite best efforts to proofread,  errors can occur which can change meaning.      Schuyler Amor, MD 01/22/17 [phone]

## 2017-01-22 NOTE — Consult Note (Signed)
Pharmacy Antibiotic Note  Laurie Steele is a 76 y.o. female admitted on 01/22/2017 with sepsis.  Pharmacy has been consulted for vancomycin and zosyn dosing. Pt is also influenza positive. Pt is a HD pt. Received HD today. Pt received 1g of vancomycin and 1 dose of zosyn 3.375g in ED  Plan: Pt received 1g of vancomycin in the ED. Will give an additional 500mg  for a total load of 1500mg  ([id number]).  Vancomycin 750mg  q Tue, thus, sat w/ HD Zosyn 3.375mg  q 12 hours starting tomorrow AM  Height: 5\' 7"  (170.2 cm) Weight: 180 lb (81.6 kg) IBW/kg (Calculated) : 61.6  Temp (24hrs), Avg:99.9 F (37.7 C), Min:99.3 F (37.4 C), Max:100.5 F (38.1 C)   Recent Labs Lab 01/22/17 1700 01/22/17 1812  WBC 6.9  --   CREATININE  --  4.28*  LATICACIDVEN 3.6*  --     Estimated Creatinine Clearance: 12.3 mL/min (A) (by C-G formula based on SCr of 4.28 mg/dL (H)).    Allergies  Allergen Reactions  . Alprazolam Other (See Comments)    Unable to sleep and confusion    Antimicrobials this admission: vancomycin 3/20 >>  zosyn 3/20 >>  tamiflu 3/20>>  Dose adjustments this admission:   Microbiology results: 3/20 BCx:  3/20 UCx:     Thank you for allowing pharmacy to be a part of this patient's care.  Ramond Dial, Pharm.D, BCPS Clinical Pharmacist  01/22/2017 8:23 PM

## 2017-01-22 NOTE — ED Notes (Signed)
Pink sleeve placed on left arm.

## 2017-01-22 NOTE — ED Notes (Signed)
Patient's shirt was cut off with patient's permission due to being covered with emesis.

## 2017-01-22 NOTE — H&P (Signed)
[facility] NAME: Laurie Steele    MR#:  [id number]  DATE OF BIRTH:  [birth year]  DATE OF ADMISSION:  01/22/2017  PRIMARY CARE PHYSICIAN: [name], MD   REQUESTING/REFERRING PHYSICIAN: Dr. Burlene Arnt  CHIEF COMPLAINT:   Chief Complaint  Patient presents with  . Altered Mental Status    HISTORY OF PRESENT ILLNESS:  Laurie Steele  is a 76 y.o. female with a known history of Anterior disease, memory problems, prior history of UTI with septic coccus bacteremia presents to the emergency room due to fever of 100.5 and the dialysis center along with confusion and weakness. Here patient has been found to have elevated lactic acid with confusion. Also UTI. Not much history can be obtained from the patient. She seems to have worsening memory problems over the past few months according to the daughter.  PAST MEDICAL HISTORY:   Past Medical History:  Diagnosis Date  . CAD (coronary artery disease)   . Chronic anemia    Dr Inez Pilgrim  . GERD (gastroesophageal reflux disease)   . Glaucoma   . Gout   . Hx of colonic polyps   . Hyperlipidemia   . Hypertension   . Lower back pain   . Miscarriage   . 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 Peritoneal dialysis status ([place])   . Renal failure    Dr Holley Raring  . 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 Vaginal delivery    x 4    PAST SURGICAL HISTORY:   Past Surgical History:  Procedure Laterality Date  . ABDOMINAL HYSTERECTOMY  1983  . AV FISTULA PLACEMENT Left 09/21/2016   Procedure: ARTERIOVENOUS (AV) FISTULA CREATION ( BRACHIOCEPHALIC );  Surgeon: Katha Cabal, MD;  Location: [facility] ORS;  Service: Vascular;  Laterality: Left;  . BACK SURGERY  6/08   Dr Collier Salina  . BACK SURGERY  1987   disc  . CHOLECYSTECTOMY    . INSERTION OF DIALYSIS CATHETER  2017  .  PERITONEAL CATHETER INSERTION    . REMOVAL OF A DIALYSIS CATHETER N/A 09/21/2016   Procedure: REMOVAL OF A DIALYSIS CATHETER ( PERITONEAL DIALYSIS CATH );  Surgeon: Katha Cabal, MD;  Location: [facility] ORS;  Service: Vascular;  Laterality: N/A;    SOCIAL HISTORY:   Social History  Substance Use Topics  . Smoking status: Former Smoker    Quit date: 11/05/1990  . Smokeless tobacco: Never Used  . Alcohol use No    FAMILY HISTORY:   Family History  Problem Relation Age of Onset  . Heart attack Father   . Hypertension Mother   . Hyperlipidemia Mother   . Coronary artery disease      strong fam hx  . Kidney failure Brother   . Breast cancer Neg Hx     DRUG ALLERGIES:   Allergies  Allergen Reactions  . Alprazolam Other (See Comments)    Unable to sleep and confusion    REVIEW OF SYSTEMS:   Review of Systems  Unable to perform ROS: Dementia    MEDICATIONS AT HOME:   Prior to Admission medications   Medication Sig Start Date End Date Taking? Authorizing Provider  acetaminophen (TYLENOL) 650 MG CR tablet Take 650 mg by mouth every 8 (eight) hours as needed for pain.   Yes Historical Provider, MD  allopurinol (  ZYLOPRIM) 100 MG tablet Take 1 tablet by mouth daily.     Historical Provider, MD  amitriptyline (ELAVIL) 25 MG tablet Take 1 tablet by mouth at bedtime as needed (For foot pain).  01/02/16   Historical Provider, MD  amLODipine (NORVASC) 10 MG tablet Take 10 mg by mouth daily. 11/03/16   Historical Provider, MD  aspirin EC 81 MG tablet Take 1 tablet (81 mg total) by mouth daily. Patient taking differently: Take 325 mg by mouth daily.  06/18/16   Fritzi Mandes, MD  cinacalcet (SENSIPAR) 90 MG tablet Take 90 mg by mouth daily. With lunch    Historical Provider, MD  cloNIDine (CATAPRES) 0.1 MG tablet Take 0.1 mg by mouth 2 (two) times daily. 11/08/16   Historical Provider, MD  colchicine 0.6 MG tablet Take 0.5 tablets (0.3 mg total) by mouth 2 (two) times a week. 07/09/16   Demetrios Loll, MD  cyclobenzaprine (FLEXERIL) 5 MG tablet Take by mouth.    Historical Provider, MD  diclofenac sodium (VOLTAREN) 1 % GEL Apply 2 g four times a day PRN Not a candidate for oral NSAIDS, ESRD on PD 06/27/16   Historical Provider, MD  docusate sodium (COLACE) 100 MG capsule Take 1 capsule (100 mg total) by mouth daily as needed for mild constipation. 12/13/16   Loletha Grayer, MD  famotidine (PEPCID) 20 MG tablet  11/29/16   Historical Provider, MD  ferrous sulfate 325 (65 FE) MG tablet Take 1 tablet (325 mg total) by mouth daily with breakfast. 06/18/16   Fritzi Mandes, MD  fluticasone (FLONASE) 50 MCG/ACT nasal spray Place 2 sprays into both nostrils daily.     Historical Provider, MD  furosemide (LASIX) 40 MG tablet Take 1 tablet (40 mg total) by mouth daily. 07/05/16   Demetrios Loll, MD  gabapentin (NEURONTIN) 100 MG capsule Take 1 capsule (100 mg total) by mouth at bedtime. 07/05/16   Demetrios Loll, MD  hydrALAZINE (APRESOLINE) 50 MG tablet Take 50 mg by mouth 2 (two) times daily.  11/29/16   Historical Provider, MD  insulin aspart (NOVOLOG) 100 UNIT/ML injection Inject 0-5 Units into the skin at bedtime. Patient taking differently: Inject 0-5 Units into the skin at bedtime. If blood sugar is 70-120, no insulin. If 121-250 take 2 units, 251-300 take 3 units, 301-350 take 4 units, 351-400 take 5 units. If over 400 call physician. 05/28/16   Fritzi Mandes, MD  ipratropium (ATROVENT) 0.03 % nasal spray Place 1 spray into both nostrils 3 (three) times daily as needed for rhinitis.    Historical Provider, MD  ipratropium-albuterol (DUONEB) 0.5-2.5 (3) MG/3ML SOLN Take 3 mLs by nebulization every 6 (six) hours as needed. 12/14/16   Gladstone Lighter, MD  isosorbide mononitrate (IMDUR) 30 MG 24 hr tablet Take 30 mg by mouth 2 (two) times daily. 11/03/16   Historical Provider, MD  latanoprost (XALATAN) 0.005 % ophthalmic solution Place 1 drop into both eyes at bedtime. 03/20/16   Historical Provider, MD  metoprolol  succinate (TOPROL-XL) 25 MG 24 hr tablet Take 1 tablet (25 mg total) by mouth at bedtime. 12/13/16   Loletha Grayer, MD  multivitamin (RENA-VIT) TABS tablet Take 1 tablet by mouth daily.    Historical Provider, MD  pantoprazole (PROTONIX) 40 MG tablet Take 1 tablet (40 mg total) by mouth daily. 06/18/16   Fritzi Mandes, MD  pravastatin (PRAVACHOL) 80 MG tablet Take 40 mg by mouth at bedtime.  06/21/15   Historical Provider, MD  sevelamer (RENVELA) 800 MG tablet  Take 800 mg by mouth 3 (three) times daily with meals.     Historical Provider, MD     VITAL SIGNS:  Blood pressure (!) 168/76, pulse 97, temperature 99.3 F (37.4 C), temperature source Oral, resp. rate 20, height 5\' 7"  (1.702 m), weight 81.6 kg (180 lb), SpO2 100 %.  PHYSICAL EXAMINATION:  Physical Exam  GENERAL:  76 y.o.-year-old patient lying in the bed with no acute distress.  EYES: Pupils equal, round, reactive to light and accommodation. No scleral icterus. Extraocular muscles intact.  HEENT: Head atraumatic, normocephalic. Oropharynx and nasopharynx clear. No oropharyngeal erythema, moist oral mucosa  NECK:  Supple, no jugular venous distention. No thyroid enlargement, no tenderness.  LUNGS: Normal breath sounds bilaterally, no wheezing, rales, rhonchi. No use of accessory muscles of respiration.  CARDIOVASCULAR: S1, S2 normal. No murmurs, rubs, or gallops.  ABDOMEN: Soft, nontender, nondistended. Bowel sounds present. No organomegaly or mass.  EXTREMITIES: No pedal edema, cyanosis, or clubbing. + 2 pedal & radial pulses b/l.   NEUROLOGIC: On following commands PSYCHIATRIC: The patient is Drowsy SKIN: No obvious rash, lesion, or ulcer.  tunneled dialysis catheter  LABORATORY PANEL:   CBC  Recent Labs Lab 01/22/17 1700  WBC 6.9  HGB 10.4*  HCT 31.9*  PLT 206   ------------------------------------------------------------------------------------------------------------------  Chemistries   Recent Labs Lab  01/22/17 1812  NA 135  K 3.1*  CL 96*  CO2 25  GLUCOSE 212*  BUN 19  CREATININE 4.28*  CALCIUM 8.5*  AST 73*  ALT 39  ALKPHOS 85  BILITOT 0.9   ------------------------------------------------------------------------------------------------------------------  Cardiac Enzymes No results for input(s): TROPONINI in the last 168 hours. ------------------------------------------------------------------------------------------------------------------  RADIOLOGY:  Dg Chest Port 1 View  Result Date: 01/22/2017 CLINICAL DATA:  Cough, nausea and vomiting weakness. EXAM: PORTABLE CHEST 1 VIEW COMPARISON:  12/11/2016 FINDINGS: Central venous line with split tip catheters the RIGHT atrium. Normal cardiac silhouette. Mild central venous pulmonary congestion. No effusion infiltrate pneumothorax. IMPRESSION: Mild venous congestion. Electronically Signed   By: Suzy Bouchard M.D.   On: 01/22/2017 17:43     IMPRESSION AND PLAN:   * Sepsis secondary to UTI Patient received 1 L bolus in the ER. No further IV fluids as patient is on dialysis. Start IV vancomycin and Zosyn. Patient is at high risk for multidrug resistant organisms. She had bacteremia in the past. Send for urine and blood cultures.  * Acute encephalopathy over dementia due to UTI. Fall precautions. Watch for inpatient delirium.  * End-stage renal disease on hemodialysis. Consult nephrology.  * Anemia of chronic disease is stable  * DVT prophylaxis with heparin  All the records are reviewed and case discussed with ED provider. Management plans discussed with the patient, family and they are in agreement.  CODE STATUS: FULL CODE  TOTAL TIME TAKING CARE OF THIS PATIENT: 40 minutes.   Hillary Bow R M.D on 01/22/2017 at 7:03 PM  Between 7am to 6pm - Pager - [phone]  After 6pm go to [email or web address] - password [email or web address] [facility]  Office  [phone]  CC: Primary care physician;  [name], MD  Note: This dictation was prepared with Dragon dictation along with smaller phrase technology. Any transcriptional errors that result from this process are unintentional.

## 2017-01-22 NOTE — ED Notes (Signed)
Pt transport to 218

## 2017-01-22 NOTE — ED Triage Notes (Signed)
Pt in via POV with complaints, sent over from dialysis with granddaughter.  Granddaughter reports pt has "not been right" since last night, family wanting to call EMS then but pt refused.  Dialysis advised family to get her here in the event that she had possibly suffered a stroke.  Pt reports right side weakness, unable to give me much other information; family is vague with information, just continues to state, "this isn't my grandmaw, she isnt right."

## 2017-01-22 NOTE — ED Notes (Signed)
No further vomiting noted at this time.

## 2017-01-23 ENCOUNTER — Inpatient Hospital Stay: Payer: Medicare Other

## 2017-01-23 LAB — GLUCOSE, CAPILLARY
GLUCOSE-CAPILLARY: 103 mg/dL — AB (ref 65–99)
GLUCOSE-CAPILLARY: 103 mg/dL — AB (ref 65–99)
GLUCOSE-CAPILLARY: 91 mg/dL (ref 65–99)
GLUCOSE-CAPILLARY: 128 mg/dL — AB (ref 65–99)
Glucose-Capillary: 78 mg/dL (ref 65–99)

## 2017-01-23 LAB — CBC
HEMATOCRIT: 28.2 % — AB (ref 35.0–47.0)
HEMOGLOBIN: 9.1 g/dL — AB (ref 12.0–16.0)
MCH: 34 pg (ref 26.0–34.0)
MCHC: 32.1 g/dL (ref 32.0–36.0)
MCV: 105.9 fL — ABNORMAL HIGH (ref 80.0–100.0)
Platelets: 147 10*3/uL — ABNORMAL LOW (ref 150–440)
RBC: 2.66 MIL/uL — ABNORMAL LOW (ref 3.80–5.20)
RDW: 17.5 % — AB (ref 11.5–14.5)
WBC: 6 10*3/uL (ref 3.6–11.0)

## 2017-01-23 LAB — BASIC METABOLIC PANEL
ANION GAP: 14 (ref 5–15)
BUN: 31 mg/dL — ABNORMAL HIGH (ref 6–20)
CALCIUM: 8.6 mg/dL — AB (ref 8.9–10.3)
CHLORIDE: 98 mmol/L — AB (ref 101–111)
CO2: 26 mmol/L (ref 22–32)
Creatinine, Ser: 5.8 mg/dL — ABNORMAL HIGH (ref 0.44–1.00)
GFR calc non Af Amer: 6 mL/min — ABNORMAL LOW (ref >60)
GFR, EST AFRICAN AMERICAN: 7 mL/min — AB (ref >60)
Glucose, Bld: 112 mg/dL — ABNORMAL HIGH (ref 65–99)
Potassium: 3.8 mmol/L (ref 3.5–5.1)
Sodium: 138 mmol/L (ref 135–145)

## 2017-01-23 MED ORDER — DEXTROSE 5 % IV SOLN
1.0000 g | INTRAVENOUS | Status: DC
  Administered 2017-01-23 – 2017-01-25 (×3): 1 g via INTRAVENOUS
  Filled 2017-01-23 (×3): qty 10

## 2017-01-23 MED ORDER — CEFTRIAXONE SODIUM-DEXTROSE 1-3.74 GM-% IV SOLR: 1.0000 g | INTRAVENOUS | Status: DC

## 2017-01-23 NOTE — Consult Note (Signed)
[facility] Infectious Disease     Reason for Consult:  ABX managemetn   Referring Physician: Carlynn Spry Date of Admission:  01/22/2017   Active Problems:   UTI (urinary tract infection)   HPI: Laurie Steele is a 76 y.o. female admitted with fever from HD center and confusion. On admit pt had wbc 7, fever to 102.1.  She has hx of  ESRD on HD. She also had Grp B strep bacteremia in July 2017 but resolved.  UA with TNTC wbc, UCX pending. Flu PCR +. CXR negative Started initially on vanco and zosyn and tamiflu. Now on tamiflu and CTX. HD stable She denies any dysuria, does not make much urine per her report.    Past Medical History:  Diagnosis Date  . CAD (coronary artery disease)   . Chronic anemia    Dr Inez Pilgrim  . GERD (gastroesophageal reflux disease)   . Glaucoma   . Gout   . Hx of colonic polyps   . Hyperlipidemia   . Hypertension   . Lower back pain   . Miscarriage   . 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 Peritoneal dialysis status ([place])   . Renal failure    Dr Holley Raring  . 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 Vaginal delivery    x 4   Past Surgical History:  Procedure Laterality Date  . ABDOMINAL HYSTERECTOMY  1983  . AV FISTULA PLACEMENT Left 09/21/2016   Procedure: ARTERIOVENOUS (AV) FISTULA CREATION ( BRACHIOCEPHALIC );  Surgeon: Katha Cabal, MD;  Location: [facility] ORS;  Service: Vascular;  Laterality: Left;  . BACK SURGERY  6/08   Dr Collier Salina  . BACK SURGERY  1987   disc  . CHOLECYSTECTOMY    . INSERTION OF DIALYSIS CATHETER  2017  . PERITONEAL CATHETER INSERTION    . REMOVAL OF A DIALYSIS CATHETER N/A 09/21/2016   Procedure: REMOVAL OF A DIALYSIS CATHETER ( PERITONEAL DIALYSIS CATH );  Surgeon: Katha Cabal, MD;  Location: [facility] ORS;  Service: Vascular;  Laterality: N/A;   Social History  Substance Use Topics  . Smoking  status: Former Smoker    Quit date: 11/05/1990  . Smokeless tobacco: Never Used  . Alcohol use No   Family History  Problem Relation Age of Onset  . Heart attack Father   . Hypertension Mother   . Hyperlipidemia Mother   . Coronary artery disease      strong fam hx  . Kidney failure Brother   . Breast cancer Neg Hx     Allergies:  Allergies  Allergen Reactions  . Alprazolam Other (See Comments)    Unable to sleep and confusion    Current antibiotics: Antibiotics Given (last 72 hours)    Date/Time Action Medication Dose Rate   01/22/17 2124 Given   oseltamivir (TAMIFLU) capsule 30 mg 30 mg    01/23/17 0005 Given   vancomycin (VANCOCIN) 500 mg in sodium chloride 0.9 % 100 mL IVPB 500 mg 100 mL/hr   01/23/17 1229 Given   cefTRIAXone (ROCEPHIN) 1 g in dextrose 5 % 50 mL IVPB 1 g 100 mL/hr      MEDICATIONS: . allopurinol  100 mg Oral Daily  . aspirin EC  81 mg Oral Daily  . cefTRIAXone (ROCEPHIN) IVPB 1 gram/50 mL D5W  1 g Intravenous Q24H  . cinacalcet  90 mg Oral Daily  . cloNIDine  0.1 mg Oral BID  . [START ON 01/24/2017] colchicine  0.3 mg Oral Once per day on Mon Thu  . docusate sodium  100 mg Oral BID  . fluticasone  2 spray Each Nare Daily  . furosemide  40 mg Oral Daily  . gabapentin  100 mg Oral QHS  . heparin  5,000 Units Subcutaneous Q8H  . hydrALAZINE  50 mg Oral BID  . insulin aspart  0-9 Units Subcutaneous TID WC  . isosorbide mononitrate  30 mg Oral BID  . metoprolol succinate  25 mg Oral QHS  . [START ON 01/24/2017] oseltamivir  30 mg Oral Once per day on Tue Thu Sat  . pantoprazole  40 mg Oral Daily  . pravastatin  40 mg Oral QHS  . sevelamer carbonate  800 mg Oral TID WC  . sodium chloride flush  3 mL Intravenous Q12H    Review of Systems - unable to obtain due to dementia  OBJECTIVE: Temp:  [98.7 F (37.1 C)-102.1 F (38.9 C)] 100.4 F (38 C) (03/21 0639) Pulse Rate:  [85-108] 85 (03/21 0414) Resp:  [18-29] 20 (03/21 0414) BP:  (129-176)/(55-85) 141/60 (03/21 0414) SpO2:  [94 %-100 %] 98 % (03/21 0414) Weight:  [81.6 kg (180 lb)-82.4 kg (181 lb 11.2 oz)] 82.4 kg (181 lb 11.2 oz) (03/21 0641) Physical Exam  Constitutional:  Frail, interactive but somewhat slowed mentation HENT: [place]/AT, PERRLA, no scleral icterus Mouth/Throat: Oropharynx is clear and dry. No oropharyngeal exudate.  Cardiovascular: Normal rate, regular rhythm and normal heart sounds.  Pulmonary/Chest: Effort normal and breath sounds normal. No respiratory distress.  has no wheezes.  Neck = supple, no nuchal rigidity Abdominal: Soft. Bowel sounds are normal.  exhibits no distension. There is no tenderness.  Lymphadenopathy: no cervical adenopathy. No axillary adenopathy Neurological: alert and interactive Skin: Skin is warm and dry. No rash noted. No erythema.  Psychiatric: a normal mood and affect.  behavior is normal.   LABS: Results for orders placed or performed during the hospital encounter of 01/22/17 (from the past 48 hour(s))  Glucose, capillary     Status: Abnormal   Collection Time: 01/22/17  4:24 PM  Result Value Ref Range   Glucose-Capillary 169 (H) 65 - 99 mg/dL   Comment 1 Notify RN   CBC WITH DIFFERENTIAL     Status: Abnormal   Collection Time: 01/22/17  5:00 PM  Result Value Ref Range   WBC 6.9 3.6 - 11.0 K/uL   RBC 2.96 (L) 3.80 - 5.20 MIL/uL   Hemoglobin 10.4 (L) 12.0 - 16.0 g/dL   HCT 31.9 (L) 35.0 - 47.0 %   MCV 107.6 (H) 80.0 - 100.0 fL   MCH 35.3 (H) 26.0 - 34.0 pg   MCHC 32.8 32.0 - 36.0 g/dL   RDW 17.9 (H) 11.5 - 14.5 %   Platelets 206 150 - 440 K/uL   Neutrophils Relative % 48 %   Neutro Abs 3.3 1.4 - 6.5 K/uL   Lymphocytes Relative 39 %   Lymphs Abs 2.6 1.0 - 3.6 K/uL   Monocytes Relative 11 %   Monocytes Absolute 0.7 0.2 - 0.9 K/uL   Eosinophils Relative 2 %   Eosinophils Absolute 0.1 0 - 0.7 K/uL   Basophils Relative 0 %   Basophils Absolute 0.0 0 - 0.1 K/uL  Urinalysis, Routine w reflex microscopic      Status: Abnormal   Collection Time: 01/22/17  5:00 PM  Result  Value Ref Range   Color, Urine YELLOW (A) YELLOW   APPearance CLOUDY (A) CLEAR   Specific Gravity, Urine 1.015 1.005 - 1.030   pH 6.0 5.0 - 8.0   Glucose, UA 50 (A) NEGATIVE mg/dL   Hgb urine dipstick SMALL (A) NEGATIVE   Bilirubin Urine NEGATIVE NEGATIVE   Ketones, ur NEGATIVE NEGATIVE mg/dL   Protein, ur >=300 (A) NEGATIVE mg/dL   Nitrite NEGATIVE NEGATIVE   Leukocytes, UA LARGE (A) NEGATIVE   RBC / HPF 0-5 0 - 5 RBC/hpf   WBC, UA TOO NUMEROUS TO COUNT 0 - 5 WBC/hpf   Bacteria, UA RARE (A) NONE SEEN   Squamous Epithelial / LPF 0-5 (A) NONE SEEN   WBC Clumps PRESENT   Influenza panel by PCR (type A & B)     Status: Abnormal   Collection Time: 01/22/17  5:00 PM  Result Value Ref Range   Influenza A By PCR NEGATIVE NEGATIVE   Influenza B By PCR POSITIVE (A) NEGATIVE    Comment: (NOTE) The Xpert Xpress Flu assay is intended as an aid in the diagnosis of  influenza and should not be used as a sole basis for treatment.  This  assay is FDA approved for nasopharyngeal swab specimens only. Nasal  washings and aspirates are unacceptable for Xpert Xpress Flu testing.   Lactic acid, plasma     Status: Abnormal   Collection Time: 01/22/17  5:00 PM  Result Value Ref Range   Lactic Acid, Venous 3.6 (HH) 0.5 - 1.9 mmol/L    Comment: CRITICAL RESULT CALLED TO, READ BACK BY AND VERIFIED WITH SONJA WEAVER AT 1806 01/22/2017.  TFK   Blood Culture (routine x 2)     Status: None (Preliminary result)   Collection Time: 01/22/17  5:01 PM  Result Value Ref Range   Specimen Description BLOOD RIGHT AC    Special Requests BOTTLES DRAWN AEROBIC AND ANAEROBIC BCAV    Culture NO GROWTH < 24 HOURS    Report Status PENDING   Blood Culture (routine x 2)     Status: None (Preliminary result)   Collection Time: 01/22/17  5:01 PM  Result Value Ref Range   Specimen Description BLOOD RIGHT HAND    Special Requests BOTTLES DRAWN AEROBIC AND  ANAEROBIC BCAV    Culture NO GROWTH < 24 HOURS    Report Status PENDING   Protime-INR     Status: None   Collection Time: 01/22/17  6:12 PM  Result Value Ref Range   Prothrombin Time 13.4 11.4 - 15.2 seconds   INR 1.02   Comprehensive metabolic panel     Status: Abnormal   Collection Time: 01/22/17  6:12 PM  Result Value Ref Range   Sodium 135 135 - 145 mmol/L   Potassium 3.1 (L) 3.5 - 5.1 mmol/L   Chloride 96 (L) 101 - 111 mmol/L   CO2 25 22 - 32 mmol/L   Glucose, Bld 212 (H) 65 - 99 mg/dL   BUN 19 6 - 20 mg/dL   Creatinine, Ser 4.28 (H) 0.44 - 1.00 mg/dL   Calcium 8.5 (L) 8.9 - 10.3 mg/dL   Total Protein 6.9 6.5 - 8.1 g/dL   Albumin 3.3 (L) 3.5 - 5.0 g/dL   AST 73 (H) 15 - 41 U/L   ALT 39 14 - 54 U/L   Alkaline Phosphatase 85 38 - 126 U/L   Total Bilirubin 0.9 0.3 - 1.2 mg/dL   GFR calc non Af Amer 9 (L) >60  mL/min   GFR calc Af Amer 11 (L) >60 mL/min    Comment: (NOTE) The eGFR has been calculated using the CKD EPI equation. This calculation has not been validated in all clinical situations. eGFR's persistently <60 mL/min signify possible Chronic Kidney Disease.    Anion gap 14 5 - 15  Lactic acid, plasma     Status: Abnormal   Collection Time: 01/22/17  8:40 PM  Result Value Ref Range   Lactic Acid, Venous 3.3 (HH) 0.5 - 1.9 mmol/L    Comment: CRITICAL RESULT CALLED TO, READ BACK BY AND VERIFIED WITH SONJIA WEAVER 01/22/17 @ 2121  MLK   Glucose, capillary     Status: Abnormal   Collection Time: 01/22/17 11:45 PM  Result Value Ref Range   Glucose-Capillary 181 (H) 65 - 99 mg/dL  Glucose, capillary     Status: Abnormal   Collection Time: 01/23/17  7:55 AM  Result Value Ref Range   Glucose-Capillary 103 (H) 65 - 99 mg/dL  CBC     Status: Abnormal   Collection Time: 01/23/17  8:49 AM  Result Value Ref Range   WBC 6.0 3.6 - 11.0 K/uL   RBC 2.66 (L) 3.80 - 5.20 MIL/uL   Hemoglobin 9.1 (L) 12.0 - 16.0 g/dL   HCT 28.2 (L) 35.0 - 47.0 %   MCV 105.9 (H) 80.0 - 100.0  fL   MCH 34.0 26.0 - 34.0 pg   MCHC 32.1 32.0 - 36.0 g/dL   RDW 17.5 (H) 11.5 - 14.5 %   Platelets 147 (L) 150 - 440 K/uL  Basic metabolic panel     Status: Abnormal   Collection Time: 01/23/17  8:49 AM  Result Value Ref Range   Sodium 138 135 - 145 mmol/L   Potassium 3.8 3.5 - 5.1 mmol/L   Chloride 98 (L) 101 - 111 mmol/L   CO2 26 22 - 32 mmol/L   Glucose, Bld 112 (H) 65 - 99 mg/dL   BUN 31 (H) 6 - 20 mg/dL   Creatinine, Ser 5.80 (H) 0.44 - 1.00 mg/dL   Calcium 8.6 (L) 8.9 - 10.3 mg/dL   GFR calc non Af Amer 6 (L) >60 mL/min   GFR calc Af Amer 7 (L) >60 mL/min    Comment: (NOTE) The eGFR has been calculated using the CKD EPI equation. This calculation has not been validated in all clinical situations. eGFR's persistently <60 mL/min signify possible Chronic Kidney Disease.    Anion gap 14 5 - 15  Glucose, capillary     Status: Abnormal   Collection Time: 01/23/17 12:06 PM  Result Value Ref Range   Glucose-Capillary 103 (H) 65 - 99 mg/dL   No components found for: ESR, C REACTIVE PROTEIN MICRO: Recent Results (from the past 720 hour(s))  Blood Culture (routine x 2)     Status: None (Preliminary result)   Collection Time: 01/22/17  5:01 PM  Result Value Ref Range Status   Specimen Description BLOOD RIGHT AC  Final   Special Requests BOTTLES DRAWN AEROBIC AND ANAEROBIC BCAV  Final   Culture NO GROWTH < 24 HOURS  Final   Report Status PENDING  Incomplete  Blood Culture (routine x 2)     Status: None (Preliminary result)   Collection Time: 01/22/17  5:01 PM  Result Value Ref Range Status   Specimen Description BLOOD RIGHT HAND  Final   Special Requests BOTTLES DRAWN AEROBIC AND ANAEROBIC BCAV  Final   Culture NO GROWTH < 24 HOURS  Final   Report Status PENDING  Incomplete    IMAGING: Dg Chest Port 1 View  Result Date: 01/22/2017 CLINICAL DATA:  Cough, nausea and vomiting weakness. EXAM: PORTABLE CHEST 1 VIEW COMPARISON:  12/11/2016 FINDINGS: Central venous line with  split tip catheters the RIGHT atrium. Normal cardiac silhouette. Mild central venous pulmonary congestion. No effusion infiltrate pneumothorax. IMPRESSION: Mild venous congestion. Electronically Signed   By: Suzy Bouchard M.D.   On: 01/22/2017 17:43    Assessment:   Laurie Steele is a 76 y.o. female admitted with Fever and confusion from HD. Flu +. UA + but she makes minimal urine and has no sxs. [facility] pending.   Recommendations Continue tamiflu Can follow ucx and tailor abx to that. Cont ctx for now. Thank you very much for allowing me to participate in the care of this patient. Please call with questions.   Cheral Marker. Ola Spurr, MD

## 2017-01-23 NOTE — Progress Notes (Signed)
Called Dr. Manuella Ghazi to ask for a speech consult since patient had gotten choked this morning on breakfast and she also stated that this was going on at home too.  He gave a verbal order for a speech consult.

## 2017-01-23 NOTE — Care Management (Signed)
Patient admitted for sepsis.  Patient open with [facility] for RN.  Patient was set up with home O2 in February.  Patient has been on RA this admission.  Chronic hemodialysis through [facility] T [facility] Sat.  Elvera Bicker, HD liaison notified of admission.  RNCM following.

## 2017-01-23 NOTE — Progress Notes (Signed)
[facility]  ROUNDING NOTE   Subjective:   Ms. EMONII WIENKE admitted to [facility] on 01/22/2017 For Lethargy [R53.83] Elevated serum lactate dehydrogenase [R74.0] Urinary tract infection without hematuria, site unspecified [N39.0] Nausea and vomiting, intractability of vomiting not specified, unspecified vomiting type [R11.2]   Last dialysis Tuesday.   Objective:  Vital signs in last 24 hours:  Temp:  [98.7 F (37.1 C)-102.1 F (38.9 C)] 99.3 F (37.4 C) (03/21 1528) Pulse Rate:  [78-90] 78 (03/21 1528) Resp:  [18-22] 22 (03/21 1528) BP: (129-141)/(51-60) 131/51 (03/21 1528) SpO2:  [91 %-99 %] 91 % (03/21 1528) Weight:  [82.4 kg (181 lb 11.2 oz)] 82.4 kg (181 lb 11.2 oz) (03/21 0641)  Weight change:  Filed Weights   01/22/17 1635 01/23/17 0641  Weight: 81.6 kg (180 lb) 82.4 kg (181 lb 11.2 oz)    Intake/Output: I/O last 3 completed shifts: In: 2993 [P.O.:360; IV Piggyback:1395] Out: 220 [Urine:220]   Intake/Output this shift:  No intake/output data recorded.  Physical Exam: General: lethargic  Head: Normocephalic, atraumatic. Moist oral mucosal membranes  Eyes: Anicteric, PERRL  Neck: Supple, trachea midline  Lungs:  Clear to auscultation  Heart: Regular rate and rhythm  Abdomen:  Soft, nontender,   Extremities:  trace peripheral edema.  Neurologic: Nonfocal, moving all four extremities  Skin: No lesions  Access: RIJ permcath, left arm AVF    Basic Metabolic Panel:  Recent Labs Lab 01/22/17 1812 01/23/17 0849  NA 135 138  K 3.1* 3.8  CL 96* 98*  CO2 25 26  GLUCOSE 212* 112*  BUN 19 31*  CREATININE 4.28* 5.80*  CALCIUM 8.5* 8.6*    Liver Function Tests:  Recent Labs Lab 01/22/17 1812  AST 73*  ALT 39  ALKPHOS 85  BILITOT 0.9  PROT 6.9  ALBUMIN 3.3*   No results for input(s): LIPASE, AMYLASE in the last 168 hours. No results for input(s): AMMONIA in the last 168 hours.  CBC:  Recent Labs Lab 01/22/17 1700 01/23/17 0849   WBC 6.9 6.0  NEUTROABS 3.3  --   HGB 10.4* 9.1*  HCT 31.9* 28.2*  MCV 107.6* 105.9*  PLT 206 147*    Cardiac Enzymes: No results for input(s): CKTOTAL, CKMB, CKMBINDEX, TROPONINI in the last 168 hours.  BNP: Invalid input(s): POCBNP  CBG:  Recent Labs Lab 01/22/17 1624 01/22/17 2345 01/23/17 0755 01/23/17 1206 01/23/17 1653  GLUCAP 169* 181* 103* 103* 91    Microbiology: Results for orders placed or performed during the hospital encounter of 01/22/17  Blood Culture (routine x 2)     Status: None (Preliminary result)   Collection Time: 01/22/17  5:01 PM  Result Value Ref Range Status   Specimen Description BLOOD RIGHT AC  Final   Special Requests BOTTLES DRAWN AEROBIC AND ANAEROBIC BCAV  Final   Culture NO GROWTH < 24 HOURS  Final   Report Status PENDING  Incomplete  Blood Culture (routine x 2)     Status: None (Preliminary result)   Collection Time: 01/22/17  5:01 PM  Result Value Ref Range Status   Specimen Description BLOOD RIGHT HAND  Final   Special Requests BOTTLES DRAWN AEROBIC AND ANAEROBIC BCAV  Final   Culture NO GROWTH < 24 HOURS  Final   Report Status PENDING  Incomplete    Coagulation Studies:  Recent Labs  01/22/17 1812  LABPROT 13.4  INR 1.02    Urinalysis:  Recent Labs  01/22/17 1700  COLORURINE YELLOW*  LABSPEC 1.015  PHURINE 6.0  [place] NEGATIVE  PROTEINUR >=300*  NITRITE NEGATIVE  LEUKOCYTESUR LARGE*      Imaging: Ct Head Wo Contrast  Result Date: 01/23/2017 CLINICAL DATA:  Confusion and fever.  End-stage renal disease EXAM: CT HEAD WITHOUT CONTRAST TECHNIQUE: Contiguous axial images were obtained from the base of the skull through the vertex without intravenous contrast. COMPARISON:  None. FINDINGS: Brain: There is mild diffuse atrophy. There is no intracranial mass, hemorrhage, extra-axial fluid collection, or midline shift. There is mild small vessel disease in the  centra semiovale bilaterally. Elsewhere gray-white compartments are normal. There is no evident acute infarct. Vascular: No hyperdense vessel. There are foci of calcification in both carotid siphon regions. There is also calcification in both distal vertebral arteries. Skull: Bony calvarium appears intact. Sinuses/Orbits: Visualized paranasal sinuses are clear. Visualized orbits appear symmetric bilaterally. Other: Visualized mastoid air cells are clear. IMPRESSION: Mild atrophy with mild periventricular small vessel disease. No intracranial mass, hemorrhage, or extra-axial fluid collection. There are multiple foci of arterial vascular calcification. Electronically Signed   By: Lowella Grip III M.D.   On: 01/23/2017 15:48   Dg Chest Port 1 View  Result Date: 01/22/2017 CLINICAL DATA:  Cough, nausea and vomiting weakness. EXAM: PORTABLE CHEST 1 VIEW COMPARISON:  12/11/2016 FINDINGS: Central venous line with split tip catheters the RIGHT atrium. Normal cardiac silhouette. Mild central venous pulmonary congestion. No effusion infiltrate pneumothorax. IMPRESSION: Mild venous congestion. Electronically Signed   By: Suzy Bouchard M.D.   On: 01/22/2017 17:43     Medications:    . allopurinol  100 mg Oral Daily  . aspirin EC  81 mg Oral Daily  . cefTRIAXone (ROCEPHIN) IVPB 1 gram/50 mL D5W  1 g Intravenous Q24H  . cinacalcet  90 mg Oral Daily  . cloNIDine  0.1 mg Oral BID  . [START ON 01/24/2017] colchicine  0.3 mg Oral Once per day on Mon Thu  . docusate sodium  100 mg Oral BID  . fluticasone  2 spray Each Nare Daily  . furosemide  40 mg Oral Daily  . gabapentin  100 mg Oral QHS  . heparin  5,000 Units Subcutaneous Q8H  . hydrALAZINE  50 mg Oral BID  . insulin aspart  0-9 Units Subcutaneous TID WC  . isosorbide mononitrate  30 mg Oral BID  . metoprolol succinate  25 mg Oral QHS  . [START ON 01/24/2017] oseltamivir  30 mg Oral Once per day on Tue Thu Sat  . pantoprazole  40 mg Oral Daily  .  pravastatin  40 mg Oral QHS  . sevelamer carbonate  800 mg Oral TID WC  . sodium chloride flush  3 mL Intravenous Q12H   sodium chloride, acetaminophen **OR** acetaminophen, albuterol, ipratropium-albuterol, ondansetron **OR** ondansetron (ZOFRAN) IV, polyethylene glycol, sodium chloride flush  Assessment/ Plan:  Ms. JOLEY UTECHT is a 76 y.o. black female with ESRD on hemodialysis, hypertension, diabetes mellitus type II, diabetic retinopathy, hyperlipidemia, gout, glaucoma, GERD, coronary artery disease.  TTS [facility]   1. End Stage Renal Disease: TTS with permcath and maturing AVF. Will attempt to use AVF tomorrow with treatment.   2. Hypertension: blood pressure at goal.  - clonidine, furosemide, hydralazine, imdur, metoprolol  3. Anemia of chronic kidney disease: hemoglobin 9.1 - EPO with HD.   4. Secondary Hyperparathyroidism: - cinacalcet - sevelamer for binding.   5. Influenza: renally dosed oseltamivir    LOS: 1  [place], Lurena Nida 3/21/20188:01 PM

## 2017-01-23 NOTE — Progress Notes (Signed)
Notified Dr Manuella Ghazi that lab had contacted RN to state they were unable to draw labs on pt, and asked if he wanted labs to be drawn via arterial draw. Orders received

## 2017-01-23 NOTE — Evaluation (Signed)
Physical Therapy Evaluation Patient Details Name: Laurie Steele MRN: [id number] DOB: 09-25-1941 Today's Date: 01/23/2017   History of Present Illness  Pt. is a 76 y.o. F presenting to the ED from dialysis on 01/23/76 with altered mental status, nausea/vomitting, and dry, unproductive cough. Pt. found to have UTI and positive test for influenza upon admitting. PMH of CHF, CAD, HTN, DM with neuropathy and retinopathy, renal failure, LBP with hx of 2 back sugeries, L AV fistula (brachiocephalic), and R IJ permcath.   Clinical Impression  Upon entering the room the pt was lying in bed with both legs hanging off of the EOB and laying at a diagonal with the HOB elevated. Pt reported trying to get out of bed and had slid herself into that position. Pt was adjusted into sitting edge of bed with max assist of 1. Pt wrapped L arm around therapist to complete transition from supine to sitting. In sitting, patient oriented x4 (not able to recall exact date, but recalled day of the week and month). Pt. exhibited an intermittent significant R lateral lean in sitting, was able to correct with verbal cues to press R hand into the bed and sit up straight, but would start leaning again with fatigue (PT provided close SBA to mod assist in sitting). Pt O2 sats and HR WNL in sitting. Pt presented with significant increased muscle weakness in R UE and LE compared to L side. Pt reported that the R sided weakness was new/started at dialysis (just before she was admitted to the ED). Pt attempted to stand one time with RW with min assist on L and max assist on R with significant R lateral lean and decreased ability to grip RW with R hand. After first attempt to stand pt reported feeling dizzy and wanting to lay down (BP was 127/70 in sitting after attempt to stand). Pt. required +2 max assist at UEs and LEs to transition from sitting to supine. MD and nursing notified of R sided weakness. Pt currently does not appear to be at prior  level of function per pt report and noted in recent PT notes and does not appear safe to d/c home.    Follow Up Recommendations SNF    Equipment Recommendations  Rolling walker with 5" wheels    Recommendations for Other Services OT consult     Precautions / Restrictions Precautions Precautions: Fall;Other (comment) Precaution Comments: L AV fistula; R IJ permcath Restrictions Weight Bearing Restrictions: No      Mobility  Bed Mobility Overal bed mobility: Needs Assistance Bed Mobility: Supine to Sit;Sit to Supine     Supine to sit: Max assist Sit to supine: Max assist;+2 for physical assistance   General bed mobility comments: supine to sit assist for trunk and scooting to EOB; sit to supine assist for trunk and LEs and assist boost up in bed.   Transfers Overall transfer level: Needs assistance Equipment used: Rolling walker (2 wheeled) Transfers: Sit to/from Stand Sit to Stand: Max assist;Min assist;+2 physical assistance         General transfer comment: Pt. assisted with L UE and LE for sit to stand, but had difficulty using R UE and LE; pt. presented with significant R lateral lean requiring max assist to maintain upright; PT provided LE guarding to prevent R foot from sliding forward; pt. had difficulty gripping RW with R hand    Ambulation/Gait             General Gait Details: Not appropriate  at this time due to significant assist levels required for standing.  Stairs            Wheelchair Mobility    Modified Rankin (Stroke Patients Only)       Balance Overall balance assessment: History of Falls;Needs assistance (Pt. reports falling out of bed a few days ago.) Sitting-balance support: Feet supported;Single extremity supported;Bilateral upper extremity supported Sitting balance-Leahy Scale: Poor Sitting balance - Comments: Pt. presented with intermittent R lateral lean that increased with fatigue (close stand by assist to mod assist);  verbal cues were given for R arm positioning to correct posture, but pt. unable to consistently maintain; pt. sat on EOB for approximately 15 min.   Standing balance support: Bilateral upper extremity supported (on RW) Standing balance-Leahy Scale: Zero Standing balance comment: Pt. had significant R lateral lean, pt. unable to correct with cueing.                             Pertinent Vitals/Pain Pain Assessment: 0-10 Pain Score: 5  Pain Location: L lateral thigh Pain Descriptors / Indicators: Aching;Dull (pt reports that she has had this pain for a couple of weeks and has had it before.) Pain Intervention(s): Monitored during session (MD notified of pain)    Home Living Family/patient expects to be discharged to:: Private residence Living Arrangements: Children (Son) Available Help at Discharge: Family;Available PRN/intermittently Type of Home: House Home Access: Level entry     Home Layout: One level Home Equipment: Walker - 4 wheels Additional Comments: Per prior PT note pt had shower seat and grab bars    Prior Function Level of Independence: Needs assistance   Gait / Transfers Assistance Needed: Per pt. report and chart review pt's son assists with transfers and ambulation around the home.   ADL's / Homemaking Assistance Needed: Pt. reports doing own sponge baths, but per prior PT notes daughter assists with ADLs and grooming.  Comments: Pt. reports that she lives with her son who is with her most of the day, but does leave intermittently. Pt. also reports that her other son comes to stay with her, but did not report 24/7 assistence.      Hand Dominance        Extremity/Trunk Assessment   Upper Extremity Assessment Upper Extremity Assessment: RUE deficits/detail;LUE deficits/detail RUE Deficits / Details: R UE shoulder flextion 10 deg AROM (100 deg PROM); R elbow flexion 90 deg AROM; R grip stregth decreased compared to L LUE Deficits / Details: L UE  shoulder flexion grossly 120 deg AROM; strong grip stregth on L compared to the R    Lower Extremity Assessment Lower Extremity Assessment: RLE deficits/detail;LLE deficits/detail RLE Deficits / Details: R LE hip flexion minimal muscle contraction noted in sitting; R knee extension decreased AROM (50% of range) compared to the L; R ankle DF at least 3/5; R ankle PF 0/5 LLE Deficits / Details: L LE hip flexion at least 3/5; L knee extension at lease 3/5 and full AROM; L ankle DF AROM to neutral; L ankle PF at least 3/5    Cervical / Trunk Assessment Cervical / Trunk Assessment:  (flexed trunk posture in sitting)  Communication   Communication: No difficulties  Cognition Arousal/Alertness: Lethargic Behavior During Therapy: Flat affect Overall Cognitive Status: No family/caregiver present to determine baseline cognitive functioning (pt. oriented x4 except day)  General Comments  Pt agreeable to PT session; nursing cleared pt for participation with PT    Exercises  Sitting balance and transfer training   Assessment/Plan    PT Assessment Patient needs continued PT services  PT Problem List Decreased strength;Decreased range of motion;Decreased activity tolerance;Decreased balance;Decreased mobility;Decreased coordination;Decreased knowledge of use of DME;Decreased safety awareness;Pain       PT Treatment Interventions DME instruction;Gait training;Functional mobility training;Therapeutic activities;Therapeutic exercise;Balance training;Patient/family education    PT Goals (Current goals can be found in the Care Plan section)  Acute Rehab PT Goals Patient Stated Goal: pt. did not state goal. PT Goal Formulation: With patient    Frequency Min 2X/week   Barriers to discharge Decreased caregiver support level of assist    Co-evaluation               End of Session Equipment Utilized During Treatment: Gait belt Activity Tolerance: Patient  limited by lethargy;Patient limited by fatigue Patient left: in bed;with call bell/phone within reach;with bed alarm set Nurse Communication: Mobility status;Precautions (Nursing and MD notified of R sided weakness.) PT Visit Diagnosis: History of falling (Z91.81);Difficulty in walking, not elsewhere classified (R26.2);Muscle weakness (generalized) (M62.81)         Time: 1415-1450 PT Time Calculation (min) (ACUTE ONLY): 35 min   Charges:         PT G Codes:         Lindsea Olivar, SPT 01/23/2017, 4:28 PM

## 2017-01-23 NOTE — Progress Notes (Signed)
Patient ID: Laurie Steele, female   DOB: 1941/07/27, 76 y.o.   MRN: [id number]   [facility] PROGRESS NOTE  Laurie Steele KVQ:[id number] DOB: 1941-04-23 DOA: 01/22/2017 PCP: [name], MD  HPI/Subjective: Pleasantly confused, Flu +, keeps repeating - "I want to see my brother"  Objective: Vitals:   01/23/17 0414 01/23/17 0639  BP: (!) 141/60   Pulse: 85   Resp: 20   Temp: (!) 102.1 F (38.9 C) (!) 100.4 F (38 C)    Filed Weights   01/22/17 1635 01/23/17 0641  Weight: 81.6 kg (180 lb) 82.4 kg (181 lb 11.2 oz)    ROS: Review of Systems  Constitutional: Positive for fever. Negative for chills.  Eyes: Negative for blurred vision.  Respiratory: Negative for cough and shortness of breath.   Cardiovascular: Negative for chest pain.  Gastrointestinal: Negative for abdominal pain, constipation, diarrhea, nausea and vomiting.  Genitourinary: Negative for dysuria.  Musculoskeletal: Negative for joint pain.  Neurological: Positive for weakness. Negative for dizziness and headaches.   Exam: Physical Exam  HENT:  Nose: No mucosal edema.  Mouth/Throat: No oropharyngeal exudate or posterior oropharyngeal edema.  Eyes: Conjunctivae, EOM and lids are normal. Pupils are equal, round, and reactive to light.  Neck: No JVD present. Carotid bruit is not present. No edema present. No thyroid mass and no thyromegaly present.  Cardiovascular: S1 normal and S2 normal.  Exam reveals no gallop.   No murmur heard. Pulses:      Dorsalis pedis pulses are 2+ on the right side, and 2+ on the left side.  Respiratory: No respiratory distress. She has decreased breath sounds in the right lower field and the left lower field. She has no wheezes. She has no rhonchi. She has no rales.  GI: Soft. Bowel sounds are normal. There is no tenderness.  Musculoskeletal:       Right shoulder: She exhibits no swelling.       Right ankle: She exhibits no swelling.       Left ankle: She exhibits no  swelling.  Lymphadenopathy:    She has no cervical adenopathy.  Neurological: She is alert. She is disoriented. No cranial nerve deficit.  Skin: Skin is warm. No rash noted. Nails show no clubbing.  Psychiatric: She has a normal mood and affect. She expresses impulsivity and inappropriate judgment.      Data Reviewed: Basic Metabolic Panel:  Recent Labs Lab 01/22/17 1812 01/23/17 0849  NA 135 138  K 3.1* 3.8  CL 96* 98*  CO2 25 26  GLUCOSE 212* 112*  BUN 19 31*  CREATININE 4.28* 5.80*  CALCIUM 8.5* 8.6*   CBC:  Recent Labs Lab 01/22/17 1700 01/23/17 0849  WBC 6.9 6.0  NEUTROABS 3.3  --   HGB 10.4* 9.1*  HCT 31.9* 28.2*  MCV 107.6* 105.9*  PLT 206 147*   Cardiac Enzymes: No results for input(s): CKTOTAL, CKMB, CKMBINDEX, TROPONINI in the last 168 hours.  CBG:  Recent Labs Lab 01/22/17 1624 01/22/17 2345 01/23/17 0755 01/23/17 1206  GLUCAP 169* 181* 103* 103*      Scheduled Meds: . allopurinol  100 mg Oral Daily  . aspirin EC  81 mg Oral Daily  . cefTRIAXone (ROCEPHIN) IVPB 1 gram/50 mL D5W  1 g Intravenous Q24H  . cinacalcet  90 mg Oral Daily  . cloNIDine  0.1 mg Oral BID  . [START ON 01/24/2017] colchicine  0.3 mg Oral Once per day on Mon Thu  . docusate sodium  100  mg Oral BID  . fluticasone  2 spray Each Nare Daily  . furosemide  40 mg Oral Daily  . gabapentin  100 mg Oral QHS  . heparin  5,000 Units Subcutaneous Q8H  . hydrALAZINE  50 mg Oral BID  . insulin aspart  0-9 Units Subcutaneous TID WC  . isosorbide mononitrate  30 mg Oral BID  . metoprolol succinate  25 mg Oral QHS  . [START ON 01/24/2017] oseltamivir  30 mg Oral Once per day on Tue Thu Sat  . pantoprazole  40 mg Oral Daily  . pravastatin  40 mg Oral QHS  . sevelamer carbonate  800 mg Oral TID WC  . sodium chloride flush  3 mL Intravenous Q12H    Assessment/Plan:  * Sepsis: present on admission secondary to UTI and or influenza - continue IV Rocephin - Patient is at high risk  for multidrug resistant organisms. She had bacteremia in the past. - Await urine and blood cultures. - ID c/s  * Influenza - on Tamiflu  * Acute metabolic encephalopathy over dementia due to UTI. Fall precautions. Watch for inpatient delirium.  * End-stage renal disease on hemodialysis. Consult nephrology. Remove foley if not needed and ok with nephro  * Anemia of chronic disease is stable  * Chronic respiratory failure with pulse ox dropping down into the 80s with ambulation and overnight. Care manager to set up continuous oxygen at home. Continue oxygen supplementation as need.  * Chronic diastolic congestive heart failure: well compensated at this time  * Likely sleep apnea. Will need sleep study as outpatient. Overnight pulse oximetry showed that the patient qualifies for oxygen at night at home.  * Essential hypertension continue clonidine, hydralazine, Imdur, Toprol and lasix  * Type 2 diabetes mellitus- on sliding scale  * GERD on Protonix  * Hyperlipidemia: on pravastatin  * Diabetic neuropathy on gabapentin  * History of gout on renally dosed allopurinol and colchicin   Code Status: Full Code  Disposition Plan: in 1-2 days depending on clinical condition  Consultants:  Nephrology  ID  Time spent: 30 minutes  [place]

## 2017-01-23 NOTE — NC FL2 (Signed)
[place] LEVEL OF CARE SCREENING TOOL     IDENTIFICATION  Patient Name: Laurie Steele Birthdate: [birth year] Sex: female Admission Date (Current Location): 01/22/2017  [place] and [place] Number:  Engineering geologist and Address:  [facility], [address] [place], [place] [address]      Provider Number: [id number]  Attending Physician Name and Address:  Max Sane, MD  Relative Name and Phone Number:       Current Level of Care: Hospital Recommended Level of Care: [place] Prior Approval Number:    Date Approved/Denied:   PASRR Number: [id number]  Discharge Plan: SNF    Current Diagnoses: Patient Active Problem List   Diagnosis Date Noted  . UTI (urinary tract infection) 01/22/2017  . CHF (congestive heart failure) ([place]) 12/11/2016  . Ds DNA antibody positive 07/09/2016  . Pneumonia 07/03/2016  . Anemia 06/17/2016  . Symptomatic anemia 06/15/2016  . Hypoglycemia 05/21/2016  . Chest pain 04/05/2016  . Hypercalcemia 03/14/2016  . Bronchitis 03/03/2012  . Type II or unspecified type diabetes mellitus with renal manifestations, not stated as uncontrolled(250.40) 12/18/2011  . END STAGE RENAL DISEASE 10/23/2010  . DM (diabetes mellitus), type 2 ([place]) 12/13/2009  . NEUROPATHY 12/13/2009  . GOUT 02/15/2009  . SLEEP DISORDER 02/15/2009  . GERD 02/23/2008  . Hyperlipidemia 05/14/2007  . ANEMIA, CHRONIC 05/14/2007  . Essential hypertension 05/14/2007    Orientation RESPIRATION BLADDER Height & Weight     Self, Place, Time, Situation  Normal Continent Weight: 181 lb 11.2 oz (82.4 kg) Height:  5\' 7"  (170.2 cm)  BEHAVIORAL SYMPTOMS/MOOD NEUROLOGICAL BOWEL NUTRITION STATUS   (none)  (none) Continent Diet (renal with fluid restriction)  AMBULATORY STATUS COMMUNICATION OF NEEDS Skin   Extensive Assist Verbally Normal                       Personal Care Assistance Level of Assistance  Bathing,  Dressing Bathing Assistance: Maximum assistance   Dressing Assistance: Maximum assistance     Functional Limitations Info   (no issues)          SPECIAL CARE FACTORS FREQUENCY  PT (By licensed PT) (outpatient hemodialysis)                    Contractures Contractures Info: Not present    Additional Factors Info  Code Status Code Status Info: full             Current Medications (01/23/2017):  This is the current hospital active medication list Current Facility-Administered Medications  Medication Dose Route Frequency Provider Last Rate Last Dose  . 0.9 %  sodium chloride infusion  250 mL Intravenous PRN Hillary Bow, MD      . acetaminophen (TYLENOL) tablet 650 mg  650 mg Oral Q6H PRN Hillary Bow, MD   650 mg at 01/23/17 0431   Or  . acetaminophen (TYLENOL) suppository 650 mg  650 mg Rectal Q6H PRN Srikar Sudini, MD      . albuterol (PROVENTIL) (2.5 MG/3ML) 0.083% nebulizer solution 2.5 mg  2.5 mg Nebulization Q2H PRN Srikar Sudini, MD      . allopurinol (ZYLOPRIM) tablet 100 mg  100 mg Oral Daily Hillary Bow, MD   100 mg at 01/23/17 0913  . aspirin EC tablet 81 mg  81 mg Oral Daily Hillary Bow, MD   81 mg at 01/23/17 0913  . cefTRIAXone (ROCEPHIN) 1 g in dextrose 5 % 50 mL IVPB  1 g Intravenous Q24H Max Sane, MD   1 g at 01/23/17 1229  . cinacalcet (SENSIPAR) tablet 90 mg  90 mg Oral Daily Hillary Bow, MD   90 mg at 01/23/17 0913  . cloNIDine (CATAPRES) tablet 0.1 mg  0.1 mg Oral BID Hillary Bow, MD   0.1 mg at 01/23/17 0914  . [START ON 01/24/2017] colchicine tablet 0.3 mg  0.3 mg Oral Once per day on Mon Thu Srikar Sudini, MD      . docusate sodium (COLACE) capsule 100 mg  100 mg Oral BID Hillary Bow, MD   100 mg at 01/23/17 0914  . fluticasone (FLONASE) 50 MCG/ACT nasal spray 2 spray  2 spray Each Nare Daily Hillary Bow, MD   2 spray at 01/23/17 0914  . furosemide (LASIX) tablet 40 mg  40 mg Oral Daily Hillary Bow, MD   40 mg at 01/23/17 0914  .  gabapentin (NEURONTIN) capsule 100 mg  100 mg Oral QHS Srikar Sudini, MD      . heparin injection 5,000 Units  5,000 Units Subcutaneous Q8H Srikar Sudini, MD   5,000 Units at 01/23/17 1523  . hydrALAZINE (APRESOLINE) tablet 50 mg  50 mg Oral BID Hillary Bow, MD   50 mg at 01/23/17 0913  . insulin aspart (novoLOG) injection 0-9 Units  0-9 Units Subcutaneous TID WC Srikar Sudini, MD      . ipratropium-albuterol (DUONEB) 0.5-2.5 (3) MG/3ML nebulizer solution 3 mL  3 mL Nebulization Q6H PRN Srikar Sudini, MD      . isosorbide mononitrate (IMDUR) 24 hr tablet 30 mg  30 mg Oral BID Hillary Bow, MD   30 mg at 01/23/17 0913  . metoprolol succinate (TOPROL-XL) 24 hr tablet 25 mg  25 mg Oral QHS Srikar Sudini, MD      . ondansetron (ZOFRAN) tablet 4 mg  4 mg Oral Q6H PRN Hillary Bow, MD       Or  . ondansetron (ZOFRAN) injection 4 mg  4 mg Intravenous Q6H PRN Hillary Bow, MD      . Derrill Memo ON 01/24/2017] oseltamivir (TAMIFLU) capsule 30 mg  30 mg Oral Once per day on Tue Thu Sat Bettey Costa, MD      . pantoprazole (PROTONIX) EC tablet 40 mg  40 mg Oral Daily Hillary Bow, MD   40 mg at 01/23/17 0914  . polyethylene glycol (MIRALAX / GLYCOLAX) packet 17 g  17 g Oral Daily PRN Srikar Sudini, MD      . pravastatin (PRAVACHOL) tablet 40 mg  40 mg Oral QHS Srikar Sudini, MD      . sevelamer carbonate (RENVELA) tablet 800 mg  800 mg Oral TID WC Srikar Sudini, MD   800 mg at 01/23/17 1228  . sodium chloride flush (NS) 0.9 % injection 3 mL  3 mL Intravenous Q12H Hillary Bow, MD   3 mL at 01/23/17 0919  . sodium chloride flush (NS) 0.9 % injection 3 mL  3 mL Intravenous PRN Hillary Bow, MD         Discharge Medications: Please see discharge summary for a list of discharge medications.  Relevant Imaging Results:  Relevant Lab Results:   Additional Information ss: [id number]  Shela Leff, LCSW

## 2017-01-24 ENCOUNTER — Inpatient Hospital Stay: Payer: Medicare Other

## 2017-01-24 DIAGNOSIS — I639 Cerebral infarction, unspecified: Secondary | ICD-10-CM

## 2017-01-24 LAB — BASIC METABOLIC PANEL
Anion gap: 12 (ref 5–15)
BUN: 40 mg/dL — AB (ref 6–20)
CALCIUM: 8 mg/dL — AB (ref 8.9–10.3)
CHLORIDE: 97 mmol/L — AB (ref 101–111)
CO2: 26 mmol/L (ref 22–32)
CREATININE: 7.51 mg/dL — AB (ref 0.44–1.00)
GFR calc non Af Amer: 5 mL/min — ABNORMAL LOW (ref >60)
GFR, EST AFRICAN AMERICAN: 5 mL/min — AB (ref >60)
GLUCOSE: 92 mg/dL (ref 65–99)
Potassium: 3.6 mmol/L (ref 3.5–5.1)
Sodium: 135 mmol/L (ref 135–145)

## 2017-01-24 LAB — GLUCOSE, CAPILLARY
Glucose-Capillary: 73 mg/dL (ref 65–99)
Glucose-Capillary: 145 mg/dL — ABNORMAL HIGH (ref 65–99)
Glucose-Capillary: 160 mg/dL — ABNORMAL HIGH (ref 65–99)
Glucose-Capillary: 90 mg/dL (ref 65–99)
Glucose-Capillary: 100 mg/dL — ABNORMAL HIGH (ref 65–99)

## 2017-01-24 LAB — CBC
HEMATOCRIT: 26.3 % — AB (ref 35.0–47.0)
HEMOGLOBIN: 8.6 g/dL — AB (ref 12.0–16.0)
MCH: 34.9 pg — AB (ref 26.0–34.0)
MCHC: 32.6 g/dL (ref 32.0–36.0)
MCV: 107.2 fL — AB (ref 80.0–100.0)
Platelets: 124 10*3/uL — ABNORMAL LOW (ref 150–440)
RBC: 2.45 MIL/uL — ABNORMAL LOW (ref 3.80–5.20)
RDW: 17.5 % — AB (ref 11.5–14.5)
WBC: 4.6 10*3/uL (ref 3.6–11.0)

## 2017-01-24 LAB — URINE CULTURE

## 2017-01-24 LAB — MRSA PCR SCREENING: MRSA BY PCR: NEGATIVE

## 2017-01-24 LAB — PHOSPHORUS: Phosphorus: 7.3 mg/dL — ABNORMAL HIGH (ref 2.5–4.6)

## 2017-01-24 MED ORDER — SEVELAMER CARBONATE 800 MG PO TABS
2400.0000 mg | ORAL_TABLET | Freq: Three times a day (TID) | ORAL | Status: DC
  Administered 2017-01-24 – 2017-01-26 (×4): 2400 mg via ORAL
  Filled 2017-01-24 (×5): qty 3

## 2017-01-24 MED ORDER — HYDROCOD POLST-CPM POLST ER 10-8 MG/5ML PO SUER
5.0000 mL | Freq: Two times a day (BID) | ORAL | Status: DC
  Administered 2017-01-24 – 2017-01-26 (×5): 5 mL via ORAL
  Filled 2017-01-24 (×5): qty 5

## 2017-01-24 MED ORDER — HYDROCODONE-ACETAMINOPHEN 5-325 MG PO TABS
1.0000 | ORAL_TABLET | ORAL | Status: DC | PRN
  Administered 2017-01-24 (×2): 1 via ORAL
  Filled 2017-01-24 (×3): qty 1

## 2017-01-24 NOTE — Progress Notes (Signed)
HD TX ended  

## 2017-01-24 NOTE — Progress Notes (Signed)
PRE HD   

## 2017-01-24 NOTE — Progress Notes (Signed)
Chart reviewed. Reports yesterday of occasional swallowing difficulty. Pt is currently in dialysis. Will assess swallowing when available. Spoke with Nurse today who reports no swallowing difficulties with diet today.

## 2017-01-24 NOTE — Progress Notes (Signed)
RN attempted to do an in & out cath per verbal order from prime doc to receive urine culture. RN did not receive any urine from in & out cath. Prime doctor notified of findings, new orders to d/c urine culture order. Will continue to monitor pt.  Laurie Steele [employer]

## 2017-01-24 NOTE — Progress Notes (Signed)
Patient ID: SHAYLEE STANISLAWSKI, female   DOB: 01-02-1941, 76 y.o.   MRN: [id number]   [employer] PROGRESS NOTE  KEIRRA ZEIMET ONG:[id number] DOB: 03/01/1941 DOA: 01/22/2017 PCP: [name], MD  HPI/Subjective: Much more alert today.,  She had episode of right-sided weakness yesterday evening, after further evaluation.  This is thought to be chronic and part of overall generalized weakness  Objective: Vitals:   01/24/17 1402 01/24/17 1430  BP: (!) 144/66 (!) 129/50  Pulse: 70 70  Resp: 18 18  Temp:      Filed Weights   01/23/17 0641 01/24/17 0617 01/24/17 1200  Weight: 82.4 kg (181 lb 11.2 oz) 82.1 kg (181 lb) 82 kg (180 lb 12.4 oz)    ROS: Review of Systems  Constitutional: Positive for fever. Negative for chills.  Eyes: Negative for blurred vision.  Respiratory: Negative for cough and shortness of breath.   Cardiovascular: Negative for chest pain.  Gastrointestinal: Negative for abdominal pain, constipation, diarrhea, nausea and vomiting.  Genitourinary: Negative for dysuria.  Musculoskeletal: Negative for joint pain.  Neurological: Positive for weakness. Negative for dizziness and headaches.   Exam: Physical Exam  HENT:  Nose: No mucosal edema.  Mouth/Throat: No oropharyngeal exudate or posterior oropharyngeal edema.  Eyes: Conjunctivae, EOM and lids are normal. Pupils are equal, round, and reactive to light.  Neck: No JVD present. Carotid bruit is not present. No edema present. No thyroid mass and no thyromegaly present.  Cardiovascular: S1 normal and S2 normal.  Exam reveals no gallop.   No murmur heard. Pulses:      Dorsalis pedis pulses are 2+ on the right side, and 2+ on the left side.  Respiratory: No respiratory distress. She has decreased breath sounds in the right lower field and the left lower field. She has no wheezes. She has no rhonchi. She has no rales.  GI: Soft. Bowel sounds are normal. There is no tenderness.  Musculoskeletal:       Right  shoulder: She exhibits no swelling.       Right ankle: She exhibits no swelling.       Left ankle: She exhibits no swelling.  Lymphadenopathy:    She has no cervical adenopathy.  Neurological: She is alert. She is not disoriented. No cranial nerve deficit.  Skin: Skin is warm. No rash noted. Nails show no clubbing.  Psychiatric: She has a normal mood and affect. She does not express impulsivity or inappropriate judgment.      Data Reviewed: Basic Metabolic Panel:  Recent Labs Lab 01/22/17 1812 01/23/17 0849 01/24/17 0433  NA 135 138 135  K 3.1* 3.8 3.6  CL 96* 98* 97*  CO2 25 26 26   GLUCOSE 212* 112* 92  BUN 19 31* 40*  CREATININE 4.28* 5.80* 7.51*  CALCIUM 8.5* 8.6* 8.0*  PHOS  --   --  7.3*   CBC:  Recent Labs Lab 01/22/17 1700 01/23/17 0849 01/24/17 0433  WBC 6.9 6.0 4.6  NEUTROABS 3.3  --   --   HGB 10.4* 9.1* 8.6*  HCT 31.9* 28.2* 26.3*  MCV 107.6* 105.9* 107.2*  PLT 206 147* 124*   Cardiac Enzymes: No results for input(s): CKTOTAL, CKMB, CKMBINDEX, TROPONINI in the last 168 hours.  CBG:  Recent Labs Lab 01/23/17 2140 01/23/17 2307 01/24/17 0739 01/24/17 1142 01/24/17 1206  GLUCAP 78 128* 73 160* 145*      Scheduled Meds: . allopurinol  100 mg Oral Daily  . aspirin EC  81 mg Oral Daily  .  cefTRIAXone (ROCEPHIN) IVPB 1 gram/50 mL D5W  1 g Intravenous Q24H  . chlorpheniramine-HYDROcodone  5 mL Oral Q12H  . cinacalcet  90 mg Oral Daily  . cloNIDine  0.1 mg Oral BID  . colchicine  0.3 mg Oral Once per day on Mon Thu  . docusate sodium  100 mg Oral BID  . fluticasone  2 spray Each Nare Daily  . furosemide  40 mg Oral Daily  . gabapentin  100 mg Oral QHS  . heparin  5,000 Units Subcutaneous Q8H  . hydrALAZINE  50 mg Oral BID  . insulin aspart  0-9 Units Subcutaneous TID WC  . isosorbide mononitrate  30 mg Oral BID  . metoprolol succinate  25 mg Oral QHS  . oseltamivir  30 mg Oral Once per day on Tue Thu Sat  . pantoprazole  40 mg Oral Daily   . pravastatin  40 mg Oral QHS  . sevelamer carbonate  2,400 mg Oral TID WC  . sodium chloride flush  3 mL Intravenous Q12H    Assessment/Plan:  * Sepsis: present on admission secondary to UTI and or influenza - continue IV Rocephin  urine and blood cultures remain negative thus far - ID input appreciated  * Influenza - on Tamiflu  * Generalized weakness - Multifactorial - Physical therapy recommends short-term rehabilitation.  * Acute metabolic encephalopathy over dementia due to UTI. Fall precautions. Watch for inpatient delirium.  * End-stage renal disease on hemodialysis: Dialysis per nephrology.  * Anemia of chronic disease is stable  * Chronic respiratory failure with pulse ox dropping down into the 80s with ambulation and overnight. Care manager to set up continuous oxygen at home. Continue oxygen supplementation as need.  * Chronic diastolic congestive heart failure: well compensated at this time  * Likely sleep apnea. Will need sleep study as outpatient. Overnight pulse oximetry showed that the patient qualifies for oxygen at night at home.  * Essential hypertension continue clonidine, hydralazine, Imdur, Toprol and lasix  * Type 2 diabetes mellitus- on sliding scale  * GERD on Protonix  * Hyperlipidemia: on pravastatin  * Diabetic neuropathy on gabapentin  * History of gout on renally dosed allopurinol and colchicin   Code Status: Full Code  Disposition Plan: in 1 days depending on clinical condition, likely needs placement.  Consultants:  Nephrology  ID  Time spent: 30 minutes  Dabria Wadas [employer]

## 2017-01-24 NOTE — Progress Notes (Signed)
Post HD  

## 2017-01-24 NOTE — Progress Notes (Signed)
[facility]  ROUNDING NOTE   Subjective:   Hemodialysis for later today.  Continuing to have productive cough.   Objective:  Vital signs in last 24 hours:  Temp:  [97.4 F (36.3 C)-99.4 F (37.4 C)] 98.4 F (36.9 C) (03/22 1200) Pulse Rate:  [58-78] 71 (03/22 1300) Resp:  [18-22] 18 (03/22 1300) BP: (92-134)/(39-61) 132/60 (03/22 1300) SpO2:  [90 %-100 %] 100 % (03/22 1200) Weight:  [82 kg (180 lb 12.4 oz)-82.1 kg (181 lb)] 82 kg (180 lb 12.4 oz) (03/22 1200)  Weight change: 0.454 kg (1 lb) Filed Weights   01/23/17 0641 01/24/17 0617 01/24/17 1200  Weight: 82.4 kg (181 lb 11.2 oz) 82.1 kg (181 lb) 82 kg (180 lb 12.4 oz)    Intake/Output: I/O last 3 completed shifts: In: 7062 [P.O.:478; IV Piggyback:895] Out: 220 [Urine:220]   Intake/Output this shift:  No intake/output data recorded.  Physical Exam: General: lethargic  Head: Normocephalic, atraumatic. Moist oral mucosal membranes  Eyes: Anicteric, PERRL  Neck: Supple, trachea midline  Lungs:  Clear to auscultation  Heart: Regular rate and rhythm  Abdomen:  Soft, nontender,   Extremities:  trace peripheral edema.  Neurologic: Nonfocal, moving all four extremities  Skin: No lesions  Access: RIJ permcath, left arm AVF    Basic Metabolic Panel:  Recent Labs Lab 01/22/17 1812 01/23/17 0849 01/24/17 0433  NA 135 138 135  K 3.1* 3.8 3.6  CL 96* 98* 97*  CO2 25 26 26   GLUCOSE 212* 112* 92  BUN 19 31* 40*  CREATININE 4.28* 5.80* 7.51*  CALCIUM 8.5* 8.6* 8.0*  PHOS  --   --  7.3*    Liver Function Tests:  Recent Labs Lab 01/22/17 1812  AST 73*  ALT 39  ALKPHOS 85  BILITOT 0.9  PROT 6.9  ALBUMIN 3.3*   No results for input(s): LIPASE, AMYLASE in the last 168 hours. No results for input(s): AMMONIA in the last 168 hours.  CBC:  Recent Labs Lab 01/22/17 1700 01/23/17 0849 01/24/17 0433  WBC 6.9 6.0 4.6  NEUTROABS 3.3  --   --   HGB 10.4* 9.1* 8.6*  HCT 31.9* 28.2* 26.3*  MCV  107.6* 105.9* 107.2*  PLT 206 147* 124*    Cardiac Enzymes: No results for input(s): CKTOTAL, CKMB, CKMBINDEX, TROPONINI in the last 168 hours.  BNP: Invalid input(s): POCBNP  CBG:  Recent Labs Lab 01/23/17 2140 01/23/17 2307 01/24/17 0739 01/24/17 1142 01/24/17 1206  GLUCAP 78 128* 12 160* 145*    Microbiology: Results for orders placed or performed during the hospital encounter of 01/22/17  Urine culture     Status: Abnormal   Collection Time: 01/22/17  5:00 PM  Result Value Ref Range Status   Specimen Description URINE, RANDOM  Final   Special Requests NONE  Final   Culture MULTIPLE SPECIES PRESENT, SUGGEST RECOLLECTION (A)  Final   Report Status 01/24/2017 FINAL  Final  Blood Culture (routine x 2)     Status: None (Preliminary result)   Collection Time: 01/22/17  5:01 PM  Result Value Ref Range Status   Specimen Description BLOOD RIGHT AC  Final   Special Requests BOTTLES DRAWN AEROBIC AND ANAEROBIC BCAV  Final   Culture NO GROWTH 2 DAYS  Final   Report Status PENDING  Incomplete  Blood Culture (routine x 2)     Status: None (Preliminary result)   Collection Time: 01/22/17  5:01 PM  Result Value Ref Range Status   Specimen Description  BLOOD RIGHT HAND  Final   Special Requests BOTTLES DRAWN AEROBIC AND ANAEROBIC BCAV  Final   Culture NO GROWTH 2 DAYS  Final   Report Status PENDING  Incomplete  MRSA PCR Screening     Status: None   Collection Time: 01/24/17  8:15 AM  Result Value Ref Range Status   MRSA by PCR NEGATIVE NEGATIVE Final    Comment:        The GeneXpert MRSA Assay (FDA approved for NASAL specimens only), is one component of a comprehensive MRSA colonization surveillance program. It is not intended to diagnose MRSA infection nor to guide or monitor treatment for MRSA infections.     Coagulation Studies:  Recent Labs  01/22/17 1812  LABPROT 13.4  INR 1.02    Urinalysis:  Recent Labs  01/22/17 1700  COLORURINE YELLOW*  LABSPEC  1.015  PHURINE 6.0  GLUCOSEU 50*  HGBUR SMALL*  BILIRUBINUR NEGATIVE  KETONESUR NEGATIVE  PROTEINUR >=300*  NITRITE NEGATIVE  LEUKOCYTESUR LARGE*      Imaging: Ct Head Wo Contrast  Result Date: 01/23/2017 CLINICAL DATA:  Confusion and fever.  End-stage renal disease EXAM: CT HEAD WITHOUT CONTRAST TECHNIQUE: Contiguous axial images were obtained from the base of the skull through the vertex without intravenous contrast. COMPARISON:  None. FINDINGS: Brain: There is mild diffuse atrophy. There is no intracranial mass, hemorrhage, extra-axial fluid collection, or midline shift. There is mild small vessel disease in the centra semiovale bilaterally. Elsewhere gray-white compartments are normal. There is no evident acute infarct. Vascular: No hyperdense vessel. There are foci of calcification in both carotid siphon regions. There is also calcification in both distal vertebral arteries. Skull: Bony calvarium appears intact. Sinuses/Orbits: Visualized paranasal sinuses are clear. Visualized orbits appear symmetric bilaterally. Other: Visualized mastoid air cells are clear. IMPRESSION: Mild atrophy with mild periventricular small vessel disease. No intracranial mass, hemorrhage, or extra-axial fluid collection. There are multiple foci of arterial vascular calcification. Electronically Signed   By: Lowella Grip III M.D.   On: 01/23/2017 15:48   Dg Chest Port 1 View  Result Date: 01/22/2017 CLINICAL DATA:  Cough, nausea and vomiting weakness. EXAM: PORTABLE CHEST 1 VIEW COMPARISON:  12/11/2016 FINDINGS: Central venous line with split tip catheters the RIGHT atrium. Normal cardiac silhouette. Mild central venous pulmonary congestion. No effusion infiltrate pneumothorax. IMPRESSION: Mild venous congestion. Electronically Signed   By: Suzy Bouchard M.D.   On: 01/22/2017 17:43     Medications:    . allopurinol  100 mg Oral Daily  . aspirin EC  81 mg Oral Daily  . cefTRIAXone (ROCEPHIN) IVPB 1  gram/50 mL D5W  1 g Intravenous Q24H  . cinacalcet  90 mg Oral Daily  . cloNIDine  0.1 mg Oral BID  . colchicine  0.3 mg Oral Once per day on Mon Thu  . docusate sodium  100 mg Oral BID  . fluticasone  2 spray Each Nare Daily  . furosemide  40 mg Oral Daily  . gabapentin  100 mg Oral QHS  . heparin  5,000 Units Subcutaneous Q8H  . hydrALAZINE  50 mg Oral BID  . insulin aspart  0-9 Units Subcutaneous TID WC  . isosorbide mononitrate  30 mg Oral BID  . metoprolol succinate  25 mg Oral QHS  . oseltamivir  30 mg Oral Once per day on Tue Thu Sat  . pantoprazole  40 mg Oral Daily  . pravastatin  40 mg Oral QHS  . sevelamer carbonate  2,400 mg  Oral TID WC  . sodium chloride flush  3 mL Intravenous Q12H   sodium chloride, acetaminophen **OR** acetaminophen, albuterol, HYDROcodone-acetaminophen, ipratropium-albuterol, ondansetron **OR** ondansetron (ZOFRAN) IV, polyethylene glycol, sodium chloride flush  Assessment/ Plan:  [name] with ESRD on hemodialysis, hypertension, diabetes mellitus type II, diabetic retinopathy, hyperlipidemia, gout, glaucoma, GERD, coronary artery disease.  TTS [facility]   1. End Stage Renal Disease: TTS with permcath and maturing AVF.  Dialysis for later today.   2. Hypertension: blood pressure at goal.  - clonidine, furosemide, hydralazine, imdur, metoprolol  3. Anemia of chronic kidney disease: hemoglobin 8.6 - EPO with HD.   4. Secondary Hyperparathyroidism: with hyperphosphatemia - cinacalcet - sevelamer for binding.   5. Influenza: renally dosed oseltamivir    LOS: 2 Laurie Steele 3/22/20181:06 PM

## 2017-01-24 NOTE — Progress Notes (Signed)
Pre HD  

## 2017-01-24 NOTE — Progress Notes (Signed)
OT Cancellation Note  Patient Details Name: Laurie Steele MRN: [id number] DOB: [birth year]   Cancelled Treatment:    Reason Eval/Treat Not Completed: Patient at procedure or test/ unavailable. Order received, chart review, pt currently receiving HD. Will re-attempt OT evaluation at later date/time as pt is available.   Jeni Salles, MPH, MS, OTR/L ascom [phone] 01/24/17, 12:57 PM

## 2017-01-24 NOTE — Progress Notes (Signed)
OT Cancellation Note  Patient Details Name: Laurie Steele MRN: [id number] DOB: Jan 28, 1941   Cancelled Treatment:    Reason Eval/Treat Not Completed: Patient at procedure or test/ unavailable. On 2nd attempt to evaluate, pt still out of room for HD. Also pending further work up for stroke per neurology. Will continue to monitor and re-attempt OT evaluation at later date/time as pt is available.  Jeni Salles, MPH, MS, OTR/L ascom [phone] 01/24/17, 3:18 PM

## 2017-01-24 NOTE — Consult Note (Signed)
Referring Physician: Manuella Ghazi    Chief Complaint: Right sided weakness  HPI: Laurie Steele is an 76 y.o. female admitted with AMS and possible sepsis who was noted by PT on yesterday to have right sided weakness.  At that time it was unclear what her time of onset of symptoms were.  On conversation today patient reports that her symptoms started on Tuesday of last week and she has had difficulty walking since that time.    Date last known well: Unable to determine Time last known well: Unable to determine tPA Given: No: Unable to determine LKW, outside time window  Past Medical History:  Diagnosis Date  . CAD (coronary artery disease)   . Chronic anemia    Dr Inez Pilgrim  . GERD (gastroesophageal reflux disease)   . Glaucoma   . Gout   . Hx of colonic polyps   . Hyperlipidemia   . Hypertension   . Lower back pain   . Miscarriage   . 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 Peritoneal dialysis status ([place])   . Renal failure    Dr Holley Raring  . 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 Vaginal delivery    x 4    Past Surgical History:  Procedure Laterality Date  . ABDOMINAL HYSTERECTOMY  1983  . AV FISTULA PLACEMENT Left 09/21/2016   Procedure: ARTERIOVENOUS (AV) FISTULA CREATION ( BRACHIOCEPHALIC );  Surgeon: Katha Cabal, MD;  Location: [facility] ORS;  Service: Vascular;  Laterality: Left;  . BACK SURGERY  6/08   Dr Collier Salina  . BACK SURGERY  1987   disc  . CHOLECYSTECTOMY    . INSERTION OF DIALYSIS CATHETER  2017  . PERITONEAL CATHETER INSERTION    . REMOVAL OF A DIALYSIS CATHETER N/A 09/21/2016   Procedure: REMOVAL OF A DIALYSIS CATHETER ( PERITONEAL DIALYSIS CATH );  Surgeon: Katha Cabal, MD;  Location: [facility] ORS;  Service: Vascular;  Laterality: N/A;    Family History  Problem Relation Age of Onset  . Heart attack Father   . Hypertension Mother   .  Hyperlipidemia Mother   . Coronary artery disease      strong fam hx  . Kidney failure Brother   . Breast cancer Neg Hx    Social History:  reports that she quit smoking about 26 years ago. She has never used smokeless tobacco. She reports that she does not drink alcohol or use drugs.  Allergies:  Allergies  Allergen Reactions  . Alprazolam Other (See Comments)    Unable to sleep and confusion    Medications:  I have reviewed the patient's current medications. Prior to Admission:  Prescriptions Prior to Admission  Medication Sig Dispense Refill Last Dose  . acetaminophen (TYLENOL) 650 MG CR tablet Take 650 mg by mouth every 8 (eight) hours as needed for pain.   prn at prn  . allopurinol (ZYLOPRIM) 100 MG tablet Take 200 tablets by mouth at bedtime.    01/21/2017 at qhs  . aspirin EC 81 MG tablet Take 1 tablet (81 mg total) by mouth daily. (Patient taking differently: Take 325 mg by mouth daily. ) 30 tablet 1 01/21/2017 at qhs  . cloNIDine (CATAPRES) 0.1 MG tablet Take 0.1 mg by mouth See admin instructions. tk 1 tab po bid on sun, mon, wed, and fri, then tk 1 tab qhs on  tues, thurs, and sat   01/21/2017 at pm  . colchicine 0.6 MG tablet Take 0.5 tablets (0.3 mg total) by mouth 2 (two) times a week. (Patient taking differently: Take 0.6 mg by mouth 2 (two) times daily. ) 4 tablet 2 01/21/2017 at pm  . cyclobenzaprine (FLEXERIL) 5 MG tablet Take 5 mg by mouth as needed.    prn at prn  . diclofenac sodium (VOLTAREN) 1 % GEL Apply 2 g four times a day PRN Not a candidate for oral NSAIDS, ESRD on PD   prn at prn  . docusate sodium (COLACE) 100 MG capsule Take 1 capsule (100 mg total) by mouth daily as needed for mild constipation. 30 capsule 0 prn at prn  . famotidine (PEPCID) 20 MG tablet Take 20 mg by mouth at bedtime.    01/21/2017 at qhs  . ferrous sulfate 325 (65 FE) MG tablet Take 1 tablet (325 mg total) by mouth daily with breakfast. (Patient taking differently: Take 325 mg by mouth at  bedtime. ) 30 tablet 3 01/21/2017 at qhs  . fluticasone (FLONASE) 50 MCG/ACT nasal spray Place 2 sprays into both nostrils daily.    01/21/2017 at am  . furosemide (LASIX) 40 MG tablet Take 1 tablet (40 mg total) by mouth daily. (Patient taking differently: Take 40 mg by mouth See admin instructions. Tk 1 tab qd on Sunday, Monday, Wednesday, and Friday) 30 tablet 2 01/21/2017 at am  . gabapentin (NEURONTIN) 100 MG capsule Take 1 capsule (100 mg total) by mouth at bedtime. (Patient taking differently: Take 100 mg by mouth 2 (two) times daily. ) 30 capsule 0 01/21/2017 at pm  . hydrALAZINE (APRESOLINE) 50 MG tablet Take 50 mg by mouth See admin instructions. tk 1 tab tid on sun, mon, wed, and fri, then tk 1 tab qhs on tues, thurs and sat   01/21/2017 at pm  . insulin aspart (NOVOLOG) 100 UNIT/ML injection Inject 0-5 Units into the skin at bedtime. (Patient taking differently: Inject 0-5 Units into the skin at bedtime. If blood sugar is 70-120, no insulin. If 121-250 take 2 units, 251-300 take 3 units, 301-350 take 4 units, 351-400 take 5 units. If over 400 call physician.) 10 mL 11 prn at prn  . ipratropium (ATROVENT) 0.03 % nasal spray Place 1 spray into both nostrils 3 (three) times daily as needed for rhinitis.   prn at prn  . ipratropium-albuterol (DUONEB) 0.5-2.5 (3) MG/3ML SOLN Take 3 mLs by nebulization every 6 (six) hours as needed. 360 mL 0 prn at prn  . metoprolol succinate (TOPROL-XL) 25 MG 24 hr tablet Take 1 tablet (25 mg total) by mouth at bedtime. 30 tablet 0 01/21/2017 at qhs  . multivitamin (RENA-VIT) TABS tablet Take 1 tablet by mouth daily.   01/21/2017 at qhs  . pantoprazole (PROTONIX) 40 MG tablet Take 1 tablet (40 mg total) by mouth daily. 30 tablet 1 01/21/2017 at am  . pravastatin (PRAVACHOL) 40 MG tablet Take 40 mg by mouth at bedtime.    01/21/2017 at qhs  . amitriptyline (ELAVIL) 25 MG tablet Take 1 tablet by mouth at bedtime as needed (For foot pain).   3 Not Taking at Unknown time  .  amLODipine (NORVASC) 10 MG tablet Take 10 mg by mouth daily.  11 Not Taking at Unknown time  . cinacalcet (SENSIPAR) 90 MG tablet Take 90 mg by mouth daily. With lunch   Not Taking at Unknown time  . isosorbide mononitrate (IMDUR) 30 MG 24  hr tablet Take 30 mg by mouth 2 (two) times daily.  3 Not Taking at Unknown time  . latanoprost (XALATAN) 0.005 % ophthalmic solution Place 1 drop into both eyes at bedtime.  6 Not Taking at Unknown time  . sevelamer (RENVELA) 800 MG tablet Take 800 mg by mouth 3 (three) times daily with meals.    Not Taking at Unknown time   Scheduled: . allopurinol  100 mg Oral Daily  . aspirin EC  81 mg Oral Daily  . cefTRIAXone (ROCEPHIN) IVPB 1 gram/50 mL D5W  1 g Intravenous Q24H  . cinacalcet  90 mg Oral Daily  . cloNIDine  0.1 mg Oral BID  . colchicine  0.3 mg Oral Once per day on Mon Thu  . docusate sodium  100 mg Oral BID  . fluticasone  2 spray Each Nare Daily  . furosemide  40 mg Oral Daily  . gabapentin  100 mg Oral QHS  . heparin  5,000 Units Subcutaneous Q8H  . hydrALAZINE  50 mg Oral BID  . insulin aspart  0-9 Units Subcutaneous TID WC  . isosorbide mononitrate  30 mg Oral BID  . metoprolol succinate  25 mg Oral QHS  . oseltamivir  30 mg Oral Once per day on Tue Thu Sat  . pantoprazole  40 mg Oral Daily  . pravastatin  40 mg Oral QHS  . sevelamer carbonate  2,400 mg Oral TID WC  . sodium chloride flush  3 mL Intravenous Q12H    ROS: History obtained from the patient  General ROS: negative for - chills, fatigue, fever, night sweats, weight gain or weight loss Psychological ROS: memory difficulties Ophthalmic ROS: negative for - blurry vision, double vision, eye pain or loss of vision ENT ROS: negative for - epistaxis, nasal discharge, oral lesions, sore throat, tinnitus or vertigo Allergy and Immunology ROS: negative for - hives or itchy/watery eyes Hematological and Lymphatic ROS: negative for - bleeding problems, bruising or swollen lymph  nodes Endocrine ROS: negative for - galactorrhea, hair pattern changes, polydipsia/polyuria or temperature intolerance Respiratory ROS: negative for - cough, hemoptysis, shortness of breath or wheezing Cardiovascular ROS: negative for - chest pain, dyspnea on exertion, edema or irregular heartbeat Gastrointestinal ROS: negative for - abdominal pain, diarrhea, hematemesis, nausea/vomiting or stool incontinence Genito-Urinary ROS: negative for - dysuria, hematuria, incontinence or urinary frequency/urgency Musculoskeletal ROS: negative for - joint swelling or muscular weakness Neurological ROS: as noted in HPI Dermatological ROS: negative for rash and skin lesion changes  Physical Examination: Blood pressure (!) 124/50, pulse 66, temperature 98.6 F (37 C), temperature source Oral, resp. rate 18, height 5\' 7"  (1.702 m), weight 82.1 kg (181 lb), SpO2 99 %.  HEENT-  Normocephalic, no lesions, without obvious abnormality.  Normal external eye and conjunctiva.  Normal TM's bilaterally.  Normal auditory canals and external ears. Normal external nose, mucus membranes and septum.  Normal pharynx. Cardiovascular- S1, S2 normal, pulses palpable throughout   Lungs- chest clear, no wheezing, rales, normal symmetric air entry Abdomen- soft, non-tender; bowel sounds normal; no masses,  no organomegaly Extremities- no edema Lymph-no adenopathy palpable Musculoskeletal-no joint tenderness, deformity or swelling Skin-warm and dry, no hyperpigmentation, vitiligo, or suspicious lesions  Neurological Examination   Mental Status: Alert, oriented, thought content appropriate.  Speech fluent without evidence of aphasia.  Able to follow 3 step commands without difficulty. Cranial Nerves: II: Discs flat bilaterally; Visual fields grossly normal, pupils equal, round, reactive to light and accommodation III,IV, VI: ptosis not  present, extra-ocular motions intact bilaterally V,VII: mild decrease in the right NLF,  facial light touch sensation normal bilaterally VIII: hearing normal bilaterally IX,X: gag reflex present XI: bilateral shoulder shrug XII: midline tongue extension Motor: Generalized weakness, worse on the right with 5-/5 RUE strength and 4-/5 RLE strength Sensory: Pinprick and light touch intact throughout, bilaterally Deep Tendon Reflexes: 2+ in the upper extremities, trace at the knees and absent at the ankles.   Plantars: Right: mute   Left: mute Cerebellar: Normal finger-to-nose testing bilaterally Gait: not tested due to safety concerns    Laboratory Studies:  Basic Metabolic Panel:  Recent Labs Lab 01/22/17 1812 01/23/17 0849 01/24/17 0433  NA 135 138 135  K 3.1* 3.8 3.6  CL 96* 98* 97*  CO2 25 26 26   GLUCOSE 212* 112* 92  BUN 19 31* 40*  CREATININE 4.28* 5.80* 7.51*  CALCIUM 8.5* 8.6* 8.0*  PHOS  --   --  7.3*    Liver Function Tests:  Recent Labs Lab 01/22/17 1812  AST 73*  ALT 39  ALKPHOS 85  BILITOT 0.9  PROT 6.9  ALBUMIN 3.3*   No results for input(s): LIPASE, AMYLASE in the last 168 hours. No results for input(s): AMMONIA in the last 168 hours.  CBC:  Recent Labs Lab 01/22/17 1700 01/23/17 0849 01/24/17 0433  WBC 6.9 6.0 4.6  NEUTROABS 3.3  --   --   HGB 10.4* 9.1* 8.6*  HCT 31.9* 28.2* 26.3*  MCV 107.6* 105.9* 107.2*  PLT 206 147* 124*    Cardiac Enzymes: No results for input(s): CKTOTAL, CKMB, CKMBINDEX, TROPONINI in the last 168 hours.  BNP: Invalid input(s): POCBNP  CBG:  Recent Labs Lab 01/23/17 1206 01/23/17 1653 01/23/17 2140 01/23/17 2307 01/24/17 0739  GLUCAP 103* 91 78 128* 53    Microbiology: Results for orders placed or performed during the hospital encounter of 01/22/17  Urine culture     Status: Abnormal   Collection Time: 01/22/17  5:00 PM  Result Value Ref Range Status   Specimen Description URINE, RANDOM  Final   Special Requests NONE  Final   Culture MULTIPLE SPECIES PRESENT, SUGGEST RECOLLECTION  (A)  Final   Report Status 01/24/2017 FINAL  Final  Blood Culture (routine x 2)     Status: None (Preliminary result)   Collection Time: 01/22/17  5:01 PM  Result Value Ref Range Status   Specimen Description BLOOD RIGHT AC  Final   Special Requests BOTTLES DRAWN AEROBIC AND ANAEROBIC BCAV  Final   Culture NO GROWTH 2 DAYS  Final   Report Status PENDING  Incomplete  Blood Culture (routine x 2)     Status: None (Preliminary result)   Collection Time: 01/22/17  5:01 PM  Result Value Ref Range Status   Specimen Description BLOOD RIGHT HAND  Final   Special Requests BOTTLES DRAWN AEROBIC AND ANAEROBIC BCAV  Final   Culture NO GROWTH 2 DAYS  Final   Report Status PENDING  Incomplete  MRSA PCR Screening     Status: None   Collection Time: 01/24/17  8:15 AM  Result Value Ref Range Status   MRSA by PCR NEGATIVE NEGATIVE Final    Comment:        The GeneXpert MRSA Assay (FDA approved for NASAL specimens only), is one component of a comprehensive MRSA colonization surveillance program. It is not intended to diagnose MRSA infection nor to guide or monitor treatment for MRSA infections.     Coagulation Studies:  Recent Labs  01/22/17 1812  LABPROT 13.4  INR 1.02    Urinalysis:  Recent Labs Lab 01/22/17 1700  COLORURINE YELLOW*  LABSPEC 1.015  PHURINE 6.0  GLUCOSEU 50*  HGBUR SMALL*  BILIRUBINUR NEGATIVE  KETONESUR NEGATIVE  PROTEINUR >=300*  NITRITE NEGATIVE  LEUKOCYTESUR LARGE*    Lipid Panel:    Component Value Date/Time   CHOL 164 11/29/2012 0251   TRIG 293 (H) 11/29/2012 0251   HDL 37 (L) 11/29/2012 0251   CHOLHDL 5 06/22/2011 1053   VLDL 59 (H) 11/29/2012 0251   LDLCALC 68 11/29/2012 0251    HgbA1C:  Lab Results  Component Value Date   HGBA1C 7.1 (H) 12/11/2016    Urine Drug Screen:  No results found for: LABOPIA, COCAINSCRNUR, LABBENZ, AMPHETMU, THCU, LABBARB  Alcohol Level: No results for input(s): ETH in the last 168 hours.  Other  results: EKG: sinus tachycardia at 112 bpm.  Imaging: Ct Head Wo Contrast  Result Date: 01/23/2017 CLINICAL DATA:  Confusion and fever.  End-stage renal disease EXAM: CT HEAD WITHOUT CONTRAST TECHNIQUE: Contiguous axial images were obtained from the base of the skull through the vertex without intravenous contrast. COMPARISON:  None. FINDINGS: Brain: There is mild diffuse atrophy. There is no intracranial mass, hemorrhage, extra-axial fluid collection, or midline shift. There is mild small vessel disease in the centra semiovale bilaterally. Elsewhere gray-white compartments are normal. There is no evident acute infarct. Vascular: No hyperdense vessel. There are foci of calcification in both carotid siphon regions. There is also calcification in both distal vertebral arteries. Skull: Bony calvarium appears intact. Sinuses/Orbits: Visualized paranasal sinuses are clear. Visualized orbits appear symmetric bilaterally. Other: Visualized mastoid air cells are clear. IMPRESSION: Mild atrophy with mild periventricular small vessel disease. No intracranial mass, hemorrhage, or extra-axial fluid collection. There are multiple foci of arterial vascular calcification. Electronically Signed   By: Lowella Grip III M.D.   On: 01/23/2017 15:48   Dg Chest Port 1 View  Result Date: 01/22/2017 CLINICAL DATA:  Cough, nausea and vomiting weakness. EXAM: PORTABLE CHEST 1 VIEW COMPARISON:  12/11/2016 FINDINGS: Central venous line with split tip catheters the RIGHT atrium. Normal cardiac silhouette. Mild central venous pulmonary congestion. No effusion infiltrate pneumothorax. IMPRESSION: Mild venous congestion. Electronically Signed   By: Suzy Bouchard M.D.   On: 01/22/2017 17:43    Assessment: 76 y.o. female presenting with AMS and sepsis.  Found to have right sided weakness as well that preceded admission per patient.  Head CT performed and reviewed.  It shows no acute changes.  Patient on ASA at home.  Further  work up recommended.    Stroke Risk Factors - diabetes mellitus, hyperlipidemia and hypertension  Plan: 1. HgbA1c, fasting lipid panel 2. MRI, MRA  of the brain without contrast 3. PT consult, OT consult, Speech consult 4. Recent echo 2/6 shows an EF of 40-45%.   5. Carotid dopplers 6. Prophylactic therapy-Continue ASA 7. NPO until RN stroke swallow screen 8. Telemetry monitoring 9. Frequent neuro checks   Alexis Goodell, MD Neurology [phone] 01/24/2017, 11:46 AM

## 2017-01-24 NOTE — Progress Notes (Signed)
HD tx started  

## 2017-01-25 ENCOUNTER — Inpatient Hospital Stay: Payer: Medicare Other

## 2017-01-25 ENCOUNTER — Encounter: Payer: Self-pay | Admitting: *Deleted

## 2017-01-25 LAB — BASIC METABOLIC PANEL
Anion gap: 15 (ref 5–15)
BUN: 25 mg/dL — AB (ref 6–20)
CALCIUM: 8.1 mg/dL — AB (ref 8.9–10.3)
CO2: 26 mmol/L (ref 22–32)
Chloride: 97 mmol/L — ABNORMAL LOW (ref 101–111)
Creatinine, Ser: 5.65 mg/dL — ABNORMAL HIGH (ref 0.44–1.00)
GFR calc Af Amer: 8 mL/min — ABNORMAL LOW (ref >60)
GFR, EST NON AFRICAN AMERICAN: 7 mL/min — AB (ref >60)
GLUCOSE: 95 mg/dL (ref 65–99)
Potassium: 4 mmol/L (ref 3.5–5.1)
Sodium: 138 mmol/L (ref 135–145)

## 2017-01-25 LAB — CBC
HCT: 29.5 % — ABNORMAL LOW (ref 35.0–47.0)
Hemoglobin: 9.4 g/dL — ABNORMAL LOW (ref 12.0–16.0)
MCH: 33.9 pg (ref 26.0–34.0)
MCHC: 31.9 g/dL — ABNORMAL LOW (ref 32.0–36.0)
MCV: 106.3 fL — ABNORMAL HIGH (ref 80.0–100.0)
Platelets: 126 K/uL — ABNORMAL LOW (ref 150–440)
RBC: 2.78 MIL/uL — ABNORMAL LOW (ref 3.80–5.20)
RDW: 17.4 % — ABNORMAL HIGH (ref 11.5–14.5)
WBC: 5.4 K/uL (ref 3.6–11.0)

## 2017-01-25 LAB — GLUCOSE, CAPILLARY
GLUCOSE-CAPILLARY: 82 mg/dL (ref 65–99)
GLUCOSE-CAPILLARY: 104 mg/dL — AB (ref 65–99)
Glucose-Capillary: 109 mg/dL — ABNORMAL HIGH (ref 65–99)
Glucose-Capillary: 107 mg/dL — ABNORMAL HIGH (ref 65–99)

## 2017-01-25 LAB — LIPID PANEL
Cholesterol: 156 mg/dL (ref 0–200)
HDL: 37 mg/dL — ABNORMAL LOW (ref >40)
LDL Cholesterol: 88 mg/dL (ref 0–99)
Total CHOL/HDL Ratio: 4.2 RATIO
Triglycerides: 155 mg/dL — ABNORMAL HIGH (ref <150)
VLDL: 31 mg/dL (ref 0–40)

## 2017-01-25 LAB — HEMOGLOBIN A1C
Hgb A1c MFr Bld: 6.6 % — ABNORMAL HIGH (ref 4.8–5.6)
Mean Plasma Glucose: 143 mg/dL

## 2017-01-25 MED ORDER — ENSURE ENLIVE PO LIQD: 237.0000 mL | Freq: Two times a day (BID) | ORAL | 12 refills | Status: AC

## 2017-01-25 MED ORDER — ENSURE ENLIVE PO LIQD
237.0000 mL | Freq: Two times a day (BID) | ORAL | Status: DC
  Administered 2017-01-25: 237 mL via ORAL

## 2017-01-25 MED ORDER — OSELTAMIVIR PHOSPHATE 30 MG PO CAPS: 30.0000 mg | ORAL_CAPSULE | Freq: Two times a day (BID) | ORAL | 0 refills | Status: DC

## 2017-01-25 NOTE — Plan of Care (Signed)
Problem: Pain Managment: Goal: General experience of comfort will improve Outcome: Progressing Patient has no complaints of pain.  Problem: Bowel/Gastric: Goal: Will not experience complications related to bowel motility Outcome: Progressing Patient reports BM

## 2017-01-25 NOTE — Clinical Social Work Placement (Signed)
   CLINICAL SOCIAL WORK PLACEMENT  NOTE  Date:  01/25/2017  Patient Details  Name: DARIELYS GIGLIA MRN: [id number] Date of Birth: 01/11/1941  Clinical Social Work is seeking post-discharge placement for this patient at the [place] level of care (*CSW will initial, date and re-position this form in  chart as items are completed):  Yes   Patient/family provided with [facility] Work Department's list of facilities offering this level of care within the geographic area requested by the patient (or if unable, by the patient's family).  Yes   Patient/family informed of their freedom to choose among providers that offer the needed level of care, that participate in Medicare, Medicaid or managed care program needed by the patient, have an available bed and are willing to accept the patient.  Yes   Patient/family informed of [facility]'s ownership interest in [facility] and [facility], as well as of the fact that they are under no obligation to receive care at these facilities.  PASRR submitted to EDS on 01/25/17     PASRR number received on 01/25/17     Existing PASRR number confirmed on       FL2 transmitted to all facilities in geographic area requested by pt/family on 01/25/17     FL2 transmitted to all facilities within larger geographic area on       Patient informed that his/her managed care company has contracts with or will negotiate with certain facilities, including the following:        Yes   Patient/family informed of bed offers received.  Patient chooses bed at [facility]     Physician recommends and patient chooses bed at      Patient to be transferred to [facility] on  .  Patient to be transferred to facility by       Patient family notified on   of transfer.  Name of family member notified:        PHYSICIAN Please sign FL2     Additional Comment:     _______________________________________________ Ross Ludwig, LCSWA 01/25/2017, 5:29 PM

## 2017-01-25 NOTE — Progress Notes (Signed)
Notified Dr. Manuella Ghazi of abnormal MRI report.  Discharged canceled per Dr. Manuella Ghazi.  He will page Dr. Doy Mince.

## 2017-01-25 NOTE — Clinical Social Work Note (Signed)
Clinical Social Work Assessment  Patient Details  Name: Laurie Steele MRN: [id number] Date of Birth: 01-12-1941  Date of referral:  01/25/17               Reason for consult:  Facility Placement                Permission sought to share information with:  Facility Sport and exercise psychologist, Family Supports Permission granted to share information::  Yes, Verbal Permission Granted  Name::     [name], [name] Daughter [phone] or [name], [name] [phone] or [name], [name] [phone]   Agency::  SNF admissions  Relationship::     Contact Information:     Housing/Transportation Living arrangements for the past 2 months:  Single Family Home Source of Information:  Patient, Adult Children Patient Interpreter Needed:  None Criminal Activity/Legal Involvement Pertinent to Current Situation/Hospitalization:  No - Comment as needed Significant Relationships:  Adult Children, Other Family Members Lives with:  Adult Children Do you feel safe going back to the place where you live?  No Need for family participation in patient care:  Yes (Comment)  Care giving concerns:  Patient and family feel she needs some short term rehab before she is able to return back home.   Social Worker assessment / plan:  Patient is a 76 year old female who is alert and oriented x4, but not very talkative.  Patient's son [name] was at bedside, assessment completed by speaking with him and patient.  Patient's son reports that she lives with him and he helps take care of her.  Patient's son reports that she has never been to SNF before, CSW explained to the patient and family what to expect at SNF and the process for looking for rehab bed.  Patient has large supportive family who want what is best for her to get strong in order to return back home.  Patient's family was explained how insurance will pay for stay, patient and family gave CSW permission to begin bed search process in [place].  Patient and her  family did not express any more questions or concerns.  Employment status:  Retired Nurse, adult PT Recommendations:  [place] / Referral to community resources:  [place]  Patient/Family's Response to care:  Patient and family agreeable to going to SNF for short term rehab.  Patient/Family's Understanding of and Emotional Response to Diagnosis, Current Treatment, and Prognosis:  Patient's family is hopeful that she will not have to be in SNF for very long.  Emotional Assessment Appearance:  Appears stated age Attitude/Demeanor/Rapport:    Affect (typically observed):  Appropriate, Quiet Orientation:  Oriented to Self, Oriented to Place, Oriented to  Time, Oriented to Situation Alcohol / Substance use:  Not Applicable Psych involvement (Current and /or in the community):  No (Comment)  Discharge Needs  Concerns to be addressed:  Lack of Support Readmission within the last 30 days:  No Current discharge risk:  Lack of support system Barriers to Discharge:  Continued Medical Work up   Anell Barr 01/25/2017, 5:23 PM

## 2017-01-25 NOTE — Progress Notes (Signed)
[facility] INFECTIOUS DISEASE PROGRESS NOTE Date of Admission:  01/22/2017     ID: Laurie Steele is a 76 y.o. female with  Influenza and fevers Active Problems:   UTI (urinary tract infection)   Subjective: No fevers since 3/20. MRI shows stroke  ROS  Eleven systems are reviewed and negative except per hpi  Medications:  Antibiotics Given (last 72 hours)    Date/Time Action Medication Dose Rate   01/22/17 2124 Given   oseltamivir (TAMIFLU) capsule 30 mg 30 mg    01/23/17 0005 Given   vancomycin (VANCOCIN) 500 mg in sodium chloride 0.9 % 100 mL IVPB 500 mg 100 mL/hr   01/23/17 1229 Given   cefTRIAXone (ROCEPHIN) 1 g in dextrose 5 % 50 mL IVPB 1 g 100 mL/hr   01/24/17 1711 Given  [pt was at dialysis]   cefTRIAXone (ROCEPHIN) 1 g in dextrose 5 % 50 mL IVPB 1 g 100 mL/hr   01/24/17 2242 Given   oseltamivir (TAMIFLU) capsule 30 mg 30 mg    01/25/17 1335 Given   cefTRIAXone (ROCEPHIN) 1 g in dextrose 5 % 50 mL IVPB 1 g 100 mL/hr     . allopurinol  100 mg Oral Daily  . aspirin EC  81 mg Oral Daily  . cefTRIAXone (ROCEPHIN) IVPB 1 gram/50 mL D5W  1 g Intravenous Q24H  . chlorpheniramine-HYDROcodone  5 mL Oral Q12H  . cinacalcet  90 mg Oral Daily  . cloNIDine  0.1 mg Oral BID  . colchicine  0.3 mg Oral Once per day on Mon Thu  . docusate sodium  100 mg Oral BID  . feeding supplement (ENSURE ENLIVE)  237 mL Oral BID BM  . fluticasone  2 spray Each Nare Daily  . furosemide  40 mg Oral Daily  . gabapentin  100 mg Oral QHS  . heparin  5,000 Units Subcutaneous Q8H  . hydrALAZINE  50 mg Oral BID  . insulin aspart  0-9 Units Subcutaneous TID WC  . isosorbide mononitrate  30 mg Oral BID  . metoprolol succinate  25 mg Oral QHS  . oseltamivir  30 mg Oral Once per day on Tue Thu Sat  . pantoprazole  40 mg Oral Daily  . pravastatin  40 mg Oral QHS  . sevelamer carbonate  2,400 mg Oral TID WC  . sodium chloride flush  3 mL Intravenous Q12H    Objective: Vital signs in last  24 hours: Temp:  [98.4 F (36.9 C)-98.6 F (37 C)] 98.6 F (37 C) (03/23 1142) Pulse Rate:  [54-114] 81 (03/23 1142) Resp:  [18-20] 18 (03/23 1142) BP: (103-158)/(38-69) 103/38 (03/23 1142) SpO2:  [100 %] 100 % (03/23 0644) Weight:  [83.1 kg (183 lb 4.8 oz)] 83.1 kg (183 lb 4.8 oz) (03/23 1245) Constitutional:  Frail, interactive but somewhat slowed mentation HENT: [place]/AT, PERRLA, no scleral icterus Mouth/Throat: Oropharynx is clear and dry. No oropharyngeal exudate.  Cardiovascular: Normal rate, regular rhythm and normal heart sounds.  Pulmonary/Chest: Effort normal and breath sounds normal. No respiratory distress.  has no wheezes.  Neck = supple, no nuchal rigidity Abdominal: Soft. Bowel sounds are normal.  exhibits no distension. There is no tenderness.  Lymphadenopathy: no cervical adenopathy. No axillary adenopathy Neurological: alert and interactive Skin: Skin is warm and dry. No rash noted. No erythema.  Psychiatric: a normal mood and affect.  behavior is normal.  R ant chest wall HD cath wnl    Lab Results  Recent Labs  01/24/17 0433  01/25/17 0420  WBC 4.6 5.4  HGB 8.6* 9.4*  HCT 26.3* 29.5*  NA 135 138  K 3.6 4.0  CL 97* 97*  CO2 26 26  BUN 40* 25*  CREATININE 7.51* 5.65*    Microbiology: Results for orders placed or performed during the hospital encounter of 01/22/17  Urine culture     Status: Abnormal   Collection Time: 01/22/17  5:00 PM  Result Value Ref Range Status   Specimen Description URINE, RANDOM  Final   Special Requests NONE  Final   Culture MULTIPLE SPECIES PRESENT, SUGGEST RECOLLECTION (A)  Final   Report Status 01/24/2017 FINAL  Final  Blood Culture (routine x 2)     Status: None (Preliminary result)   Collection Time: 01/22/17  5:01 PM  Result Value Ref Range Status   Specimen Description BLOOD RIGHT AC  Final   Special Requests BOTTLES DRAWN AEROBIC AND ANAEROBIC BCAV  Final   Culture NO GROWTH 3 DAYS  Final   Report Status PENDING   Incomplete  Blood Culture (routine x 2)     Status: None (Preliminary result)   Collection Time: 01/22/17  5:01 PM  Result Value Ref Range Status   Specimen Description BLOOD RIGHT HAND  Final   Special Requests BOTTLES DRAWN AEROBIC AND ANAEROBIC BCAV  Final   Culture NO GROWTH 3 DAYS  Final   Report Status PENDING  Incomplete  MRSA PCR Screening     Status: None   Collection Time: 01/24/17  8:15 AM  Result Value Ref Range Status   MRSA by PCR NEGATIVE NEGATIVE Final    Comment:        The GeneXpert MRSA Assay (FDA approved for NASAL specimens only), is one component of a comprehensive MRSA colonization surveillance program. It is not intended to diagnose MRSA infection nor to guide or monitor treatment for MRSA infections.     Studies/Results: Ct Head Wo Contrast  Result Date: 01/23/2017 CLINICAL DATA:  Confusion and fever.  End-stage renal disease EXAM: CT HEAD WITHOUT CONTRAST TECHNIQUE: Contiguous axial images were obtained from the base of the skull through the vertex without intravenous contrast. COMPARISON:  None. FINDINGS: Brain: There is mild diffuse atrophy. There is no intracranial mass, hemorrhage, extra-axial fluid collection, or midline shift. There is mild small vessel disease in the centra semiovale bilaterally. Elsewhere gray-white compartments are normal. There is no evident acute infarct. Vascular: No hyperdense vessel. There are foci of calcification in both carotid siphon regions. There is also calcification in both distal vertebral arteries. Skull: Bony calvarium appears intact. Sinuses/Orbits: Visualized paranasal sinuses are clear. Visualized orbits appear symmetric bilaterally. Other: Visualized mastoid air cells are clear. IMPRESSION: Mild atrophy with mild periventricular small vessel disease. No intracranial mass, hemorrhage, or extra-axial fluid collection. There are multiple foci of arterial vascular calcification. Electronically Signed   By: Lowella Grip III M.D.   On: 01/23/2017 15:48   Mr Jodene Nam Head Wo Contrast  Result Date: 01/25/2017 CLINICAL DATA:  Fever. Memory disturbance. Dialysis patient. Confusion and weakness. EXAM: MRI HEAD WITHOUT CONTRAST MRA HEAD WITHOUT CONTRAST TECHNIQUE: Multiplanar, multiecho pulse sequences of the brain and surrounding structures were obtained without intravenous contrast. Angiographic images of the head were obtained using MRA technique without contrast. COMPARISON:  CT 01/23/2017 FINDINGS: MRI HEAD FINDINGS Brain: Diffusion imaging shows a 1 cm acute infarction in the deep white matter adjacent to the posterior body of the left lateral ventricle. There are a few other smaller punctate acute infarctions in  the left parietal/ frontoparietal region. Elsewhere, there are chronic small-vessel ischemic changes affecting the pons. There are a few old small vessel cerebellar infarctions. Cerebral hemispheres show mild chronic small-vessel ischemic change elsewhere. No cortical/large vessel territory infarction. No mass lesion, hemorrhage, hydrocephalus or extra-axial collection. Vascular: Major vessels at the base of the brain show flow. Skull and upper cervical spine: Negative Sinuses/Orbits: Sinuses are clear. Orbits are negative. Small mastoid effusions. Other: None MRA HEAD FINDINGS Both internal carotid arteries are patent through the skullbase and siphon regions. There is atherosclerotic irregularity in the carotid siphon regions. Maximal stenosis on the right is estimated at 50%. Maximal stenosis on the left is estimated at 30%. Supraclinoid internal carotid arteries are widely patent. Anterior and middle cerebral vessels are widely patent. Distal vessels do not show pronounced irregularity. Both vertebral arteries are patent to the basilar with the right being dominant. There is atherosclerotic irregularity of the basilar artery with maximal stenosis of 30%. Superior cerebellar and posterior cerebral arteries are  patent and appear normal. IMPRESSION: 1 cm acute infarction in the left hemispheric deep white matter adjacent to the posterior body of the left lateral ventricle. Few other punctate foci of acute infarction in the left frontoparietal brain. This raises the possibility of micro embolic infarctions. Mild chronic small-vessel ischemic change elsewhere affecting the brain. Atherosclerotic disease in both carotid siphon regions. 50% stenosis on the right. 30% stenosis on the left. 30% stenosis of the basilar artery. The intracranial branch vessels appear patent and normal. Electronically Signed   By: Nelson Chimes M.D.   On: 01/25/2017 12:50   Mr Brain Wo Contrast  Result Date: 01/25/2017 CLINICAL DATA:  Fever. Memory disturbance. Dialysis patient. Confusion and weakness. EXAM: MRI HEAD WITHOUT CONTRAST MRA HEAD WITHOUT CONTRAST TECHNIQUE: Multiplanar, multiecho pulse sequences of the brain and surrounding structures were obtained without intravenous contrast. Angiographic images of the head were obtained using MRA technique without contrast. COMPARISON:  CT 01/23/2017 FINDINGS: MRI HEAD FINDINGS Brain: Diffusion imaging shows a 1 cm acute infarction in the deep white matter adjacent to the posterior body of the left lateral ventricle. There are a few other smaller punctate acute infarctions in the left parietal/ frontoparietal region. Elsewhere, there are chronic small-vessel ischemic changes affecting the pons. There are a few old small vessel cerebellar infarctions. Cerebral hemispheres show mild chronic small-vessel ischemic change elsewhere. No cortical/large vessel territory infarction. No mass lesion, hemorrhage, hydrocephalus or extra-axial collection. Vascular: Major vessels at the base of the brain show flow. Skull and upper cervical spine: Negative Sinuses/Orbits: Sinuses are clear. Orbits are negative. Small mastoid effusions. Other: None MRA HEAD FINDINGS Both internal carotid arteries are patent through  the skullbase and siphon regions. There is atherosclerotic irregularity in the carotid siphon regions. Maximal stenosis on the right is estimated at 50%. Maximal stenosis on the left is estimated at 30%. Supraclinoid internal carotid arteries are widely patent. Anterior and middle cerebral vessels are widely patent. Distal vessels do not show pronounced irregularity. Both vertebral arteries are patent to the basilar with the right being dominant. There is atherosclerotic irregularity of the basilar artery with maximal stenosis of 30%. Superior cerebellar and posterior cerebral arteries are patent and appear normal. IMPRESSION: 1 cm acute infarction in the left hemispheric deep white matter adjacent to the posterior body of the left lateral ventricle. Few other punctate foci of acute infarction in the left frontoparietal brain. This raises the possibility of micro embolic infarctions. Mild chronic small-vessel ischemic change elsewhere affecting the  brain. Atherosclerotic disease in both carotid siphon regions. 50% stenosis on the right. 30% stenosis on the left. 30% stenosis of the basilar artery. The intracranial branch vessels appear patent and normal. Electronically Signed   By: Nelson Chimes M.D.   On: 01/25/2017 12:50   US Carotid Bilateral  Result Date: 01/24/2017 CLINICAL DATA:  Stroke. Hypertension, coronary artery disease, previous tobacco abuse. EXAM: BILATERAL CAROTID DUPLEX ULTRASOUND TECHNIQUE: Pearline Cables scale imaging, color Doppler and duplex ultrasound was performed of bilateral carotid and vertebral arteries in the neck. COMPARISON:  None. TECHNIQUE: Quantification of carotid stenosis is based on velocity parameters that correlate the residual internal carotid diameter with NASCET-based stenosis levels, using the diameter of the distal internal carotid lumen as the denominator for stenosis measurement. The following velocity measurements were obtained: PEAK SYSTOLIC/END DIASTOLIC RIGHT ICA:                      145/17cm/sec CCA:                     [id number] SYSTOLIC ICA/CCA RATIO:  1.5 DIASTOLIC ICA/CCA RATIO: 1.4 ECA:                     170cm/sec LEFT ICA:                     161/21cm/sec CCA:                     78/29FA/OZH SYSTOLIC ICA/CCA RATIO:  1.7 DIASTOLIC ICA/CCA RATIO: 1.9 ECA:                     106cm/sec FINDINGS: RIGHT CAROTID ARTERY: Eccentric calcified plaque in the mid and distal common carotid artery without high-grade stenosis. Heavier irregular eccentric partially calcified plaque in the carotid bulb extending across the bifurcation into the proximal ICA resulting in at least mild stenosis. Mildly elevated peak systolic velocities just beyond the bifurcation. Elsewhere normal waveforms and color Doppler signal. RIGHT VERTEBRAL ARTERY:  Normal flow direction and waveform. LEFT CAROTID ARTERY: Scattered nonocclusive plaque in the common carotid artery. Eccentric partially calcified plaque at the carotid bifurcation involving proximal internal and external carotid arteries resulting in at least mild stenosis. The reported peak systolic velocities are probably overestimated due to poor angle correction. Normal waveforms and color Doppler signal. LEFT VERTEBRAL ARTERY: Normal flow direction and waveform. IMPRESSION: 1. 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 The exam does not exclude plaque ulceration or embolization. Continued surveillance recommended. 2.  Antegrade bilateral vertebral arterial flow. Electronically Signed   By: Lucrezia Europe M.D.   On: 01/24/2017 17:03    Assessment/Plan: Laurie Steele is a 76 y.o. female admitted with Fever and confusion from HD. Flu +. UA + but she makes minimal urine and has no sxs. UCX BCX negative. She has also been found to have a acute CVA. Received 4 days abx.   Recommendations Continue tamiflu Would stop ceftraixone as all cxs negative Thank you very much for the consult. Will follow with  you.  Zanyia Silbaugh P   01/25/2017, 3:38 PM

## 2017-01-25 NOTE — Progress Notes (Signed)
OT Cancellation Note  Patient Details Name: Laurie Steele MRN: [id number] DOB: Dec 21, 1940   Cancelled Treatment:    Reason Eval/Treat Not Completed: Medical issues which prohibited therapy. MRI shows 1 cm acute infarction in the left hemispheric deep white matter adjacent to the posterior body of the left lateral ventricle. Few other punctate foci of acute infarction in the left frontoparietal brain. Possible micro embolic infarctions. Will await follow-up Neurology consult., and eval when appropriate.  Harrel Carina, MS, OTR/L 01/25/2017, 4:00 PM

## 2017-01-25 NOTE — Clinical Social Work Note (Signed)
CSW presented bed offers to patient's son [name] [phone] and they have chosen [facility].  CSW spoke to [facility] and they can accept patient once she is medically ready for discharge and orders have been received.  Patient's family would like to transport patient if possible once she is ready for discharge.  Jones Broom. [place], MSW, [place]  01/25/2017 5:30 PM

## 2017-01-25 NOTE — Care Management Important Message (Signed)
Important Message  Patient Details  Name: Laurie Steele MRN: [id number] Date of Birth: Aug 08, 1941   Medicare Important Message Given:  Yes    Marshell Garfinkel, RN 01/25/2017, 1:40 PM

## 2017-01-25 NOTE — Progress Notes (Signed)
[facility]  ROUNDING NOTE   Subjective:   Hemodialysis yesterday. Tolerated treatment well. UF of 1500  Family at bedside.   Objective:  Vital signs in last 24 hours:  Temp:  [98.4 F (36.9 C)-98.6 F (37 C)] 98.6 F (37 C) (03/23 1142) Pulse Rate:  [54-114] 81 (03/23 1142) Resp:  [18-20] 18 (03/23 1142) BP: (103-158)/(38-83) 103/38 (03/23 1142) SpO2:  [100 %] 100 % (03/23 0644) Weight:  [83.1 kg (183 lb 4.8 oz)] 83.1 kg (183 lb 4.8 oz) (03/23 0627)  Weight change: -0.101 kg (-3.6 oz) Filed Weights   01/24/17 0617 01/24/17 1200 01/25/17 0627  Weight: 82.1 kg (181 lb) 82 kg (180 lb 12.4 oz) 83.1 kg (183 lb 4.8 oz)    Intake/Output: I/O last 3 completed shifts: In: 738 [P.O.:736; IV Piggyback:2] Out: 5638 [Urine:375; Other:1500]   Intake/Output this shift:  No intake/output data recorded.  Physical Exam: General: lethargic  Head: Normocephalic, atraumatic. Moist oral mucosal membranes  Eyes: Anicteric, PERRL  Neck: Supple, trachea midline  Lungs:  Clear to auscultation  Heart: Regular rate and rhythm  Abdomen:  Soft, nontender,   Extremities:  no peripheral edema.  Neurologic: Nonfocal, moving all four extremities  Skin: No lesions  Access: RIJ permcath, left arm AVF    Basic Metabolic Panel:  Recent Labs Lab 01/22/17 1812 01/23/17 0849 01/24/17 0433 01/25/17 0420  NA 135 138 135 138  K 3.1* 3.8 3.6 4.0  CL 96* 98* 97* 97*  CO2 25 26 26 26   GLUCOSE 212* 112* 92 95  BUN 19 31* 40* 25*  CREATININE 4.28* 5.80* 7.51* 5.65*  CALCIUM 8.5* 8.6* 8.0* 8.1*  PHOS  --   --  7.3*  --     Liver Function Tests:  Recent Labs Lab 01/22/17 1812  AST 73*  ALT 39  ALKPHOS 85  BILITOT 0.9  PROT 6.9  ALBUMIN 3.3*   No results for input(s): LIPASE, AMYLASE in the last 168 hours. No results for input(s): AMMONIA in the last 168 hours.  CBC:  Recent Labs Lab 01/22/17 1700 01/23/17 0849 01/24/17 0433 01/25/17 0420  WBC 6.9 6.0 4.6 5.4   NEUTROABS 3.3  --   --   --   HGB 10.4* 9.1* 8.6* 9.4*  HCT 31.9* 28.2* 26.3* 29.5*  MCV 107.6* 105.9* 107.2* 106.3*  PLT 206 147* 124* 126*    Cardiac Enzymes: No results for input(s): CKTOTAL, CKMB, CKMBINDEX, TROPONINI in the last 168 hours.  BNP: Invalid input(s): POCBNP  CBG:  Recent Labs Lab 01/24/17 1206 01/24/17 1700 01/24/17 2130 01/25/17 0803 01/25/17 1137  GLUCAP 145* 90 100* 65 104*    Microbiology: Results for orders placed or performed during the hospital encounter of 01/22/17  Urine culture     Status: Abnormal   Collection Time: 01/22/17  5:00 PM  Result Value Ref Range Status   Specimen Description URINE, RANDOM  Final   Special Requests NONE  Final   Culture MULTIPLE SPECIES PRESENT, SUGGEST RECOLLECTION (A)  Final   Report Status 01/24/2017 FINAL  Final  Blood Culture (routine x 2)     Status: None (Preliminary result)   Collection Time: 01/22/17  5:01 PM  Result Value Ref Range Status   Specimen Description BLOOD RIGHT AC  Final   Special Requests BOTTLES DRAWN AEROBIC AND ANAEROBIC BCAV  Final   Culture NO GROWTH 3 DAYS  Final   Report Status PENDING  Incomplete  Blood Culture (routine x 2)  Status: None (Preliminary result)   Collection Time: 01/22/17  5:01 PM  Result Value Ref Range Status   Specimen Description BLOOD RIGHT HAND  Final   Special Requests BOTTLES DRAWN AEROBIC AND ANAEROBIC BCAV  Final   Culture NO GROWTH 3 DAYS  Final   Report Status PENDING  Incomplete  MRSA PCR Screening     Status: None   Collection Time: 01/24/17  8:15 AM  Result Value Ref Range Status   MRSA by PCR NEGATIVE NEGATIVE Final    Comment:        The GeneXpert MRSA Assay (FDA approved for NASAL specimens only), is one component of a comprehensive MRSA colonization surveillance program. It is not intended to diagnose MRSA infection nor to guide or monitor treatment for MRSA infections.     Coagulation Studies:  Recent Labs  01/22/17 1812   LABPROT 13.4  INR 1.02    Urinalysis:  Recent Labs  01/22/17 1700  COLORURINE YELLOW*  LABSPEC 1.015  PHURINE 6.0  GLUCOSEU 50*  HGBUR SMALL*  BILIRUBINUR NEGATIVE  KETONESUR NEGATIVE  PROTEINUR >=300*  NITRITE NEGATIVE  LEUKOCYTESUR LARGE*      Imaging: Ct Head Wo Contrast  Result Date: 01/23/2017 CLINICAL DATA:  Confusion and fever.  End-stage renal disease EXAM: CT HEAD WITHOUT CONTRAST TECHNIQUE: Contiguous axial images were obtained from the base of the skull through the vertex without intravenous contrast. COMPARISON:  None. FINDINGS: Brain: There is mild diffuse atrophy. There is no intracranial mass, hemorrhage, extra-axial fluid collection, or midline shift. There is mild small vessel disease in the centra semiovale bilaterally. Elsewhere gray-white compartments are normal. There is no evident acute infarct. Vascular: No hyperdense vessel. There are foci of calcification in both carotid siphon regions. There is also calcification in both distal vertebral arteries. Skull: Bony calvarium appears intact. Sinuses/Orbits: Visualized paranasal sinuses are clear. Visualized orbits appear symmetric bilaterally. Other: Visualized mastoid air cells are clear. IMPRESSION: Mild atrophy with mild periventricular small vessel disease. No intracranial mass, hemorrhage, or extra-axial fluid collection. There are multiple foci of arterial vascular calcification. Electronically Signed   By: Lowella Grip III M.D.   On: 01/23/2017 15:48   Mr Jodene Nam Head Wo Contrast  Result Date: 01/25/2017 CLINICAL DATA:  Fever. Memory disturbance. Dialysis patient. Confusion and weakness. EXAM: MRI HEAD WITHOUT CONTRAST MRA HEAD WITHOUT CONTRAST TECHNIQUE: Multiplanar, multiecho pulse sequences of the brain and surrounding structures were obtained without intravenous contrast. Angiographic images of the head were obtained using MRA technique without contrast. COMPARISON:  CT 01/23/2017 FINDINGS: MRI HEAD  FINDINGS Brain: Diffusion imaging shows a 1 cm acute infarction in the deep white matter adjacent to the posterior body of the left lateral ventricle. There are a few other smaller punctate acute infarctions in the left parietal/ frontoparietal region. Elsewhere, there are chronic small-vessel ischemic changes affecting the pons. There are a few old small vessel cerebellar infarctions. Cerebral hemispheres show mild chronic small-vessel ischemic change elsewhere. No cortical/large vessel territory infarction. No mass lesion, hemorrhage, hydrocephalus or extra-axial collection. Vascular: Major vessels at the base of the brain show flow. Skull and upper cervical spine: Negative Sinuses/Orbits: Sinuses are clear. Orbits are negative. Small mastoid effusions. Other: None MRA HEAD FINDINGS Both internal carotid arteries are patent through the skullbase and siphon regions. There is atherosclerotic irregularity in the carotid siphon regions. Maximal stenosis on the right is estimated at 50%. Maximal stenosis on the left is estimated at 30%. Supraclinoid internal carotid arteries are widely patent. Anterior and middle  cerebral vessels are widely patent. Distal vessels do not show pronounced irregularity. Both vertebral arteries are patent to the basilar with the right being dominant. There is atherosclerotic irregularity of the basilar artery with maximal stenosis of 30%. Superior cerebellar and posterior cerebral arteries are patent and appear normal. IMPRESSION: 1 cm acute infarction in the left hemispheric deep white matter adjacent to the posterior body of the left lateral ventricle. Few other punctate foci of acute infarction in the left frontoparietal brain. This raises the possibility of micro embolic infarctions. Mild chronic small-vessel ischemic change elsewhere affecting the brain. Atherosclerotic disease in both carotid siphon regions. 50% stenosis on the right. 30% stenosis on the left. 30% stenosis of the  basilar artery. The intracranial branch vessels appear patent and normal. Electronically Signed   By: Nelson Chimes M.D.   On: 01/25/2017 12:50   Mr Brain Wo Contrast  Result Date: 01/25/2017 CLINICAL DATA:  Fever. Memory disturbance. Dialysis patient. Confusion and weakness. EXAM: MRI HEAD WITHOUT CONTRAST MRA HEAD WITHOUT CONTRAST TECHNIQUE: Multiplanar, multiecho pulse sequences of the brain and surrounding structures were obtained without intravenous contrast. Angiographic images of the head were obtained using MRA technique without contrast. COMPARISON:  CT 01/23/2017 FINDINGS: MRI HEAD FINDINGS Brain: Diffusion imaging shows a 1 cm acute infarction in the deep white matter adjacent to the posterior body of the left lateral ventricle. There are a few other smaller punctate acute infarctions in the left parietal/ frontoparietal region. Elsewhere, there are chronic small-vessel ischemic changes affecting the pons. There are a few old small vessel cerebellar infarctions. Cerebral hemispheres show mild chronic small-vessel ischemic change elsewhere. No cortical/large vessel territory infarction. No mass lesion, hemorrhage, hydrocephalus or extra-axial collection. Vascular: Major vessels at the base of the brain show flow. Skull and upper cervical spine: Negative Sinuses/Orbits: Sinuses are clear. Orbits are negative. Small mastoid effusions. Other: None MRA HEAD FINDINGS Both internal carotid arteries are patent through the skullbase and siphon regions. There is atherosclerotic irregularity in the carotid siphon regions. Maximal stenosis on the right is estimated at 50%. Maximal stenosis on the left is estimated at 30%. Supraclinoid internal carotid arteries are widely patent. Anterior and middle cerebral vessels are widely patent. Distal vessels do not show pronounced irregularity. Both vertebral arteries are patent to the basilar with the right being dominant. There is atherosclerotic irregularity of the  basilar artery with maximal stenosis of 30%. Superior cerebellar and posterior cerebral arteries are patent and appear normal. IMPRESSION: 1 cm acute infarction in the left hemispheric deep white matter adjacent to the posterior body of the left lateral ventricle. Few other punctate foci of acute infarction in the left frontoparietal brain. This raises the possibility of micro embolic infarctions. Mild chronic small-vessel ischemic change elsewhere affecting the brain. Atherosclerotic disease in both carotid siphon regions. 50% stenosis on the right. 30% stenosis on the left. 30% stenosis of the basilar artery. The intracranial branch vessels appear patent and normal. Electronically Signed   By: Nelson Chimes M.D.   On: 01/25/2017 12:50   US Carotid Bilateral  Result Date: 01/24/2017 CLINICAL DATA:  Stroke. Hypertension, coronary artery disease, previous tobacco abuse. EXAM: BILATERAL CAROTID DUPLEX ULTRASOUND TECHNIQUE: Pearline Cables scale imaging, color Doppler and duplex ultrasound was performed of bilateral carotid and vertebral arteries in the neck. COMPARISON:  None. TECHNIQUE: Quantification of carotid stenosis is based on velocity parameters that correlate the residual internal carotid diameter with NASCET-based stenosis levels, using the diameter of the distal internal carotid lumen as the denominator  for stenosis measurement. The following velocity measurements were obtained: PEAK SYSTOLIC/END DIASTOLIC RIGHT ICA:                     145/17cm/sec CCA:                     14/78GN/FAO SYSTOLIC ICA/CCA RATIO:  1.5 DIASTOLIC ICA/CCA RATIO: 1.4 ECA:                     170cm/sec LEFT ICA:                     161/21cm/sec CCA:                     13/08MV/HQI SYSTOLIC ICA/CCA RATIO:  1.7 DIASTOLIC ICA/CCA RATIO: 1.9 ECA:                     106cm/sec FINDINGS: RIGHT CAROTID ARTERY: Eccentric calcified plaque in the mid and distal common carotid artery without high-grade stenosis. Heavier irregular eccentric partially  calcified plaque in the carotid bulb extending across the bifurcation into the proximal ICA resulting in at least mild stenosis. Mildly elevated peak systolic velocities just beyond the bifurcation. Elsewhere normal waveforms and color Doppler signal. RIGHT VERTEBRAL ARTERY:  Normal flow direction and waveform. LEFT CAROTID ARTERY: Scattered nonocclusive plaque in the common carotid artery. Eccentric partially calcified plaque at the carotid bifurcation involving proximal internal and external carotid arteries resulting in at least mild stenosis. The reported peak systolic velocities are probably overestimated due to poor angle correction. Normal waveforms and color Doppler signal. LEFT VERTEBRAL ARTERY: Normal flow direction and waveform. IMPRESSION: 1. 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 The exam does not exclude plaque ulceration or embolization. Continued surveillance recommended. 2.  Antegrade bilateral vertebral arterial flow. Electronically Signed   By: Lucrezia Europe M.D.   On: 01/24/2017 17:03     Medications:    . allopurinol  100 mg Oral Daily  . aspirin EC  81 mg Oral Daily  . cefTRIAXone (ROCEPHIN) IVPB 1 gram/50 mL D5W  1 g Intravenous Q24H  . chlorpheniramine-HYDROcodone  5 mL Oral Q12H  . cinacalcet  90 mg Oral Daily  . cloNIDine  0.1 mg Oral BID  . colchicine  0.3 mg Oral Once per day on Mon Thu  . docusate sodium  100 mg Oral BID  . feeding supplement (ENSURE ENLIVE)  237 mL Oral BID BM  . fluticasone  2 spray Each Nare Daily  . furosemide  40 mg Oral Daily  . gabapentin  100 mg Oral QHS  . heparin  5,000 Units Subcutaneous Q8H  . hydrALAZINE  50 mg Oral BID  . insulin aspart  0-9 Units Subcutaneous TID WC  . isosorbide mononitrate  30 mg Oral BID  . metoprolol succinate  25 mg Oral QHS  . oseltamivir  30 mg Oral Once per day on Tue Thu Sat  . pantoprazole  40 mg Oral Daily  . pravastatin  40 mg Oral QHS  . sevelamer  carbonate  2,400 mg Oral TID WC  . sodium chloride flush  3 mL Intravenous Q12H   sodium chloride, acetaminophen **OR** acetaminophen, albuterol, HYDROcodone-acetaminophen, ipratropium-albuterol, ondansetron **OR** ondansetron (ZOFRAN) IV, polyethylene glycol, sodium chloride flush  Assessment/ Plan:  [name] with ESRD on hemodialysis, hypertension, diabetes mellitus type II, diabetic retinopathy, hyperlipidemia,  gout, glaucoma, GERD, coronary artery disease.  TTS [facility] [place]   1. End Stage Renal Disease: TTS with permcath and maturing AVF.  Dialysis for tomorrow.  2. Hypertension: blood pressure at goal.  - clonidine, furosemide, hydralazine, imdur, metoprolol  3. Anemia of chronic kidney disease:   - EPO with HD.   4. Secondary Hyperparathyroidism: with hyperphosphatemia - cinacalcet - sevelamer for binding.   5. Influenza: renally dosed oseltamivir    LOS: [place], [place] 3/23/20182:35 PM

## 2017-01-25 NOTE — Progress Notes (Signed)
Patient ID: Laurie Steele, female   DOB: 23-May-1941, 76 y.o.   MRN: [id number]   [facility] PROGRESS NOTE  Laurie Steele JOI:[id number] DOB: [birth year] DOA: 01/22/2017 PCP: [name], MD  HPI/Subjective: Much more alert today.,  She had episode of right-sided weakness yesterday evening, after further evaluation.  This is thought to be chronic and part of overall generalized weakness  Objective: Vitals:   01/25/17 1031 01/25/17 1142  BP: (!) 137/58 (!) 103/38  Pulse: (!) 54 81  Resp:  18  Temp:  98.6 F (37 C)    Filed Weights   01/24/17 0617 01/24/17 1200 01/25/17 0627  Weight: 82.1 kg (181 lb) 82 kg (180 lb 12.4 oz) 83.1 kg (183 lb 4.8 oz)    ROS: Review of Systems  Constitutional: Positive for fever. Negative for chills.  Eyes: Negative for blurred vision.  Respiratory: Negative for cough and shortness of breath.   Cardiovascular: Negative for chest pain.  Gastrointestinal: Negative for abdominal pain, constipation, diarrhea, nausea and vomiting.  Genitourinary: Negative for dysuria.  Musculoskeletal: Negative for joint pain.  Neurological: Positive for weakness. Negative for dizziness and headaches.   Exam: Physical Exam  HENT:  Nose: No mucosal edema.  Mouth/Throat: No oropharyngeal exudate or posterior oropharyngeal edema.  Eyes: Conjunctivae, EOM and lids are normal. Pupils are equal, round, and reactive to light.  Neck: No JVD present. Carotid bruit is not present. No edema present. No thyroid mass and no thyromegaly present.  Cardiovascular: S1 normal and S2 normal.  Exam reveals no gallop.   No murmur heard. Pulses:      Dorsalis pedis pulses are 2+ on the right side, and 2+ on the left side.  Respiratory: No respiratory distress. She has decreased breath sounds in the right lower field and the left lower field. She has no wheezes. She has no rhonchi. She has no rales.  GI: Soft. Bowel sounds are normal. There is no tenderness.   Musculoskeletal:       Right ankle: She exhibits no swelling.       Left ankle: She exhibits no swelling.  Lymphadenopathy:    She has no cervical adenopathy.  Neurological: She is alert. She is not disoriented. No cranial nerve deficit.  Skin: Skin is warm. No rash noted. Nails show no clubbing.  Psychiatric: She has a normal mood and affect. She does not express impulsivity or inappropriate judgment.    Data Reviewed: Basic Metabolic Panel:  Recent Labs Lab 01/22/17 1812 01/23/17 0849 01/24/17 0433 01/25/17 0420  NA 135 138 135 138  K 3.1* 3.8 3.6 4.0  CL 96* 98* 97* 97*  CO2 25 26 26 26   GLUCOSE 212* 112* 92 95  BUN 19 31* 40* 25*  CREATININE 4.28* 5.80* 7.51* 5.65*  CALCIUM 8.5* 8.6* 8.0* 8.1*  PHOS  --   --  7.3*  --    CBC:  Recent Labs Lab 01/22/17 1700 01/23/17 0849 01/24/17 0433 01/25/17 0420  WBC 6.9 6.0 4.6 5.4  NEUTROABS 3.3  --   --   --   HGB 10.4* 9.1* 8.6* 9.4*  HCT 31.9* 28.2* 26.3* 29.5*  MCV 107.6* 105.9* 107.2* 106.3*  PLT 206 147* 124* 126*   Cardiac Enzymes: No results for input(s): CKTOTAL, CKMB, CKMBINDEX, TROPONINI in the last 168 hours.  CBG:  Recent Labs Lab 01/24/17 1206 01/24/17 1700 01/24/17 2130 01/25/17 0803 01/25/17 1137  GLUCAP 145* 90 100* 82 104*      Scheduled Meds: . allopurinol  100 mg Oral Daily  . aspirin EC  81 mg Oral Daily  . cefTRIAXone (ROCEPHIN) IVPB 1 gram/50 mL D5W  1 g Intravenous Q24H  . chlorpheniramine-HYDROcodone  5 mL Oral Q12H  . cinacalcet  90 mg Oral Daily  . cloNIDine  0.1 mg Oral BID  . colchicine  0.3 mg Oral Once per day on Mon Thu  . docusate sodium  100 mg Oral BID  . feeding supplement (ENSURE ENLIVE)  237 mL Oral BID BM  . fluticasone  2 spray Each Nare Daily  . furosemide  40 mg Oral Daily  . gabapentin  100 mg Oral QHS  . heparin  5,000 Units Subcutaneous Q8H  . hydrALAZINE  50 mg Oral BID  . insulin aspart  0-9 Units Subcutaneous TID WC  . isosorbide mononitrate  30 mg  Oral BID  . metoprolol succinate  25 mg Oral QHS  . oseltamivir  30 mg Oral Once per day on Tue Thu Sat  . pantoprazole  40 mg Oral Daily  . pravastatin  40 mg Oral QHS  . sevelamer carbonate  2,400 mg Oral TID WC  . sodium chloride flush  3 mL Intravenous Q12H    Assessment/Plan:  * Sepsis: present on admission secondary to UTI and or influenza - continue IV Rocephin - urine and blood cultures remain negative thus far - ID input appreciated  * Acute stroke - MRI shows 1 cm acute infarction in the left hemispheric deep white matter adjacent to the posterior body of the left lateral ventricle. Few other punctate foci of acute infarction in the left frontoparietal brain. This raises the possibility of micro embolic infarctions. - Await Neuro follow up - Check 2-D echo.  May need TEE   * Influenza - on Tamiflu  * Generalized weakness: has some rt>lt weakness - Multifactorial - Physical therapy recommends short-term rehabilitation.  * Acute metabolic encephalopathy over dementia due to UTI & Acute stroke. Fall precautions. Watch for inpatient delirium.  * End-stage renal disease on hemodialysis: Dialysis per nephrology.  * Anemia of chronic disease is stable  * Chronic respiratory failure with pulse ox dropping down into the 80s with ambulation and overnight. Care manager to set up continuous oxygen at home. Continue oxygen supplementation as need.  * Chronic diastolic congestive heart failure: well compensated at this time  * Likely sleep apnea. Will need sleep study as outpatient. Overnight pulse oximetry showed that the patient qualifies for oxygen at night at home.  * Essential hypertension continue clonidine, hydralazine, Imdur, Toprol and lasix  * Type 2 diabetes mellitus- on sliding scale  * GERD on Protonix  * Hyperlipidemia: on pravastatin  * Diabetic neuropathy on gabapentin  * History of gout on renally dosed allopurinol and colchicin   Code Status:  Full Code  Disposition Plan: in 1 days depending on clinical condition, likely needs placement.  Consultants:  Neurology  ID  Time spent: 30 minutes  [place] [employer]

## 2017-01-25 NOTE — Progress Notes (Signed)
SLP Cancellation Note  Patient Details Name: Laurie Steele MRN: [id number] DOB: 07/18/1941   Cancelled treatment:       Reason Eval/Treat Not Completed: SLP screened, no needs identified, will sign off (Consulted NSG, chart reviewed ) Pt denied any increased trouble eating/swallowing during hospitalization, stating she had "no trouble eating breakfast this morning", no coughing or feeling choked. Pt drinking water at bedside during conversation with no deficits noted. NSG denied any concerns with pt eating breakfast this morning. Pt able to communicate with no deficits noted.  Pt educated on general aspiration precautions (taking small sips and sitting upright while drinking/eating). No further skilled ST services indicated, NSG to re-consult if any change in status.    Eulogio Ditch, B.S Graduate Clinician  01/25/2017, 10:08 AM   This information has been reviewed and agreed upon by this supervising clinician.  Orinda Kenner, [place], CCC-SLP

## 2017-01-25 NOTE — Discharge Summary (Signed)
[facility] NAME: Laurie Steele    MR#:  [id number]  DATE OF BIRTH:  July 03, 1941  DATE OF ADMISSION:  01/22/2017   ADMITTING PHYSICIAN: Hillary Bow, MD  DATE OF DISCHARGE: 01/25/2017  PRIMARY CARE PHYSICIAN: [name], MD   ADMISSION DIAGNOSIS:  Lethargy [R53.83] Elevated serum lactate dehydrogenase [R74.0] Urinary tract infection without hematuria, site unspecified [N39.0] Nausea and vomiting, intractability of vomiting not specified, unspecified vomiting type [R11.2] DISCHARGE DIAGNOSIS:  Active Problems:   UTI (urinary tract infection)  SECONDARY DIAGNOSIS:   Past Medical History:  Diagnosis Date  . CAD (coronary artery disease)   . Chronic anemia    Dr Inez Pilgrim  . GERD (gastroesophageal reflux disease)   . Glaucoma   . Gout   . Hx of colonic polyps   . Hyperlipidemia   . Hypertension   . Lower back pain   . Miscarriage   . 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 Peritoneal dialysis status ([facility] [place])   . Renal failure    Dr Holley Raring  . 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 Vaginal delivery    x 4   HOSPITAL COURSE:  * Sepsis: present on admission secondary to UTI and or influenza  urine and blood cultures remain negative thus far. Treated with antibiotics.  * Influenza - Improving on Tamiflu  * Generalized weakness - Multifactorial - Physical therapy recommends short-term rehabilitation.  * Acute metabolic encephalopathy over dementia due to UTI. Fall precautions.   * End-stage renal disease on hemodialysis: Dialysis per nephrology.  * Anemia of chronic disease is stable  * Chronic respiratory failure with pulse ox dropping down into the 80s with ambulation and overnight. Care manager to set up continuous oxygen at home. Continue oxygen supplementation as need.  *  Chronic diastolic congestive heart failure: well compensated at this time  * Likely sleep apnea. Will need sleep study as outpatient. Overnight pulse oximetry showed that the patient qualifies for oxygen at night at home.  * Essential hypertension continue clonidine, hydralazine, Imdur, Toprol and lasix  * Type 2 diabetes mellitus- on sliding scale  * GERD on Protonix  * Hyperlipidemia: on pravastatin  * Diabetic neuropathy on gabapentin  * History of gout on renally dosed allopurinol and colchicin  DISCHARGE CONDITIONS:  Fair CONSULTS OBTAINED:  Treatment Team:  Lavonia Dana, MD Leonel Ramsay, MD Catarina Hartshorn, MD DRUG ALLERGIES:   Allergies  Allergen Reactions  . Alprazolam Other (See Comments)    Unable to sleep and confusion   DISCHARGE MEDICATIONS:   Allergies as of 01/25/2017      Reactions   Alprazolam Other (See Comments)   Unable to sleep and confusion      Medication List    TAKE these medications   acetaminophen 650 MG CR tablet Commonly known as:  TYLENOL Take 650 mg by mouth every 8 (eight) hours as needed for pain.   allopurinol 100 MG tablet Commonly known as:  ZYLOPRIM Take 200 tablets by mouth at bedtime.   amitriptyline 25 MG tablet Commonly known as:  ELAVIL Take 1 tablet by mouth at bedtime as needed (For foot pain).   amLODipine 10 MG tablet Commonly known as:  NORVASC Take 10 mg by mouth daily.   aspirin EC 81 MG tablet Take 1 tablet (81 mg total) by  mouth daily. What changed:  how much to take   cinacalcet 90 MG tablet Commonly known as:  SENSIPAR Take 90 mg by mouth daily. With lunch   cloNIDine 0.1 MG tablet Commonly known as:  CATAPRES Take 0.1 mg by mouth See admin instructions. tk 1 tab po bid on sun, mon, wed, and fri, then tk 1 tab qhs on tues, thurs, and sat   colchicine 0.6 MG tablet Take 0.5 tablets (0.3 mg total) by mouth 2 (two) times a week. What changed:  how much to take  when to take  this   cyclobenzaprine 5 MG tablet Commonly known as:  FLEXERIL Take 5 mg by mouth as needed.   diclofenac sodium 1 % Gel Commonly known as:  VOLTAREN Apply 2 g four times a day PRN Not a candidate for oral NSAIDS, ESRD on PD   docusate sodium 100 MG capsule Commonly known as:  COLACE Take 1 capsule (100 mg total) by mouth daily as needed for mild constipation.   famotidine 20 MG tablet Commonly known as:  PEPCID Take 20 mg by mouth at bedtime.   feeding supplement (ENSURE ENLIVE) Liqd Take 237 mLs by mouth 2 (two) times daily between meals.   ferrous sulfate 325 (65 FE) MG tablet Take 1 tablet (325 mg total) by mouth daily with breakfast. What changed:  when to take this   fluticasone 50 MCG/ACT nasal spray Commonly known as:  FLONASE Place 2 sprays into both nostrils daily.   furosemide 40 MG tablet Commonly known as:  LASIX Take 1 tablet (40 mg total) by mouth daily. What changed:  when to take this  additional instructions   gabapentin 100 MG capsule Commonly known as:  NEURONTIN Take 1 capsule (100 mg total) by mouth at bedtime. What changed:  when to take this   hydrALAZINE 50 MG tablet Commonly known as:  APRESOLINE Take 50 mg by mouth See admin instructions. tk 1 tab tid on sun, mon, wed, and fri, then tk 1 tab qhs on tues, thurs and sat   insulin aspart 100 UNIT/ML injection Commonly known as:  novoLOG Inject 0-5 Units into the skin at bedtime. What changed:  additional instructions   ipratropium 0.03 % nasal spray Commonly known as:  ATROVENT Place 1 spray into both nostrils 3 (three) times daily as needed for rhinitis.   ipratropium-albuterol 0.5-2.5 (3) MG/3ML Soln Commonly known as:  DUONEB Take 3 mLs by nebulization every 6 (six) hours as needed.   isosorbide mononitrate 30 MG 24 hr tablet Commonly known as:  IMDUR Take 30 mg by mouth 2 (two) times daily.   latanoprost 0.005 % ophthalmic solution Commonly known as:  XALATAN Place 1 drop  into both eyes at bedtime.   metoprolol succinate 25 MG 24 hr tablet Commonly known as:  TOPROL-XL Take 1 tablet (25 mg total) by mouth at bedtime.   multivitamin Tabs tablet Take 1 tablet by mouth daily.   oseltamivir 30 MG capsule Commonly known as:  TAMIFLU Take 1 capsule (30 mg total) by mouth 2 (two) times daily.   pantoprazole 40 MG tablet Commonly known as:  PROTONIX Take 1 tablet (40 mg total) by mouth daily.   pravastatin 40 MG tablet Commonly known as:  PRAVACHOL Take 40 mg by mouth at bedtime.   sevelamer carbonate 800 MG tablet Commonly known as:  RENVELA Take 800 mg by mouth 3 (three) times daily with meals.      DISCHARGE INSTRUCTIONS:   DIET:  Regular diet DISCHARGE CONDITION:  Fair ACTIVITY:  Activity as tolerated OXYGEN:  Home Oxygen: No.  Oxygen Delivery: room air DISCHARGE LOCATION:  nursing home - palliative care evaluation.  While at the facility  If you experience worsening of your admission symptoms, develop shortness of breath, life threatening emergency, suicidal or homicidal thoughts you must seek medical attention immediately by calling 911 or calling your MD immediately  if symptoms less severe.  You Must read complete instructions/literature along with all the possible adverse reactions/side effects for all the Medicines you take and that have been prescribed to you. Take any new Medicines after you have completely understood and accpet all the possible adverse reactions/side effects.   Please note  You were cared for by a hospitalist during your hospital stay. If you have any questions about your discharge medications or the care you received while you were in the hospital after you are discharged, you can call the unit and asked to speak with the hospitalist on call if the hospitalist that took care of you is not available. Once you are discharged, your primary care physician will handle any further medical issues. Please note that NO  REFILLS for any discharge medications will be authorized once you are discharged, as it is imperative that you return to your primary care physician (or establish a relationship with a primary care physician if you do not have one) for your aftercare needs so that they can reassess your need for medications and monitor your lab values.    On the day of Discharge:  VITAL SIGNS:  Blood pressure (!) 137/58, pulse (!) 54, temperature 98.4 F (36.9 C), temperature source Oral, resp. rate 20, height 5\' 7"  (1.702 m), weight 83.1 kg (183 lb 4.8 oz), SpO2 100 %. PHYSICAL EXAMINATION:  GENERAL:  76 y.o.-year-old patient lying in the bed with no acute distress.  EYES: Pupils equal, round, reactive to light and accommodation. No scleral icterus. Extraocular muscles intact.  HEENT: Head atraumatic, normocephalic. Oropharynx and nasopharynx clear.  NECK:  Supple, no jugular venous distention. No thyroid enlargement, no tenderness.  LUNGS: Normal breath sounds bilaterally, no wheezing, rales,rhonchi or crepitation. No use of accessory muscles of respiration.  CARDIOVASCULAR: S1, S2 normal. No murmurs, rubs, or gallops.  ABDOMEN: Soft, non-tender, non-distended. Bowel sounds present. No organomegaly or mass.  EXTREMITIES: No pedal edema, cyanosis, or clubbing.  NEUROLOGIC: Cranial nerves II through XII are intact. Muscle strength 5/5 in all extremities. Sensation intact. Gait not checked.  PSYCHIATRIC: The patient is alert and oriented x 3.  SKIN: No obvious rash, lesion, or ulcer.  DATA REVIEW:   CBC  Recent Labs Lab 01/25/17 0420  WBC 5.4  HGB 9.4*  HCT 29.5*  PLT 126*    Chemistries   Recent Labs Lab 01/22/17 1812  01/25/17 0420  NA 135  < > 138  K 3.1*  < > 4.0  CL 96*  < > 97*  CO2 25  < > 26  GLUCOSE 212*  < > 95  BUN 19  < > 25*  CREATININE 4.28*  < > 5.65*  CALCIUM 8.5*  < > 8.1*  AST 73*  --   --   ALT 39  --   --   ALKPHOS 85  --   --   BILITOT 0.9  --   --   < > = values  in this interval not displayed.     RADIOLOGY:  US Carotid Bilateral  Result Date: 01/24/2017 CLINICAL DATA:  Stroke. Hypertension, coronary artery disease, previous tobacco abuse. EXAM: BILATERAL CAROTID DUPLEX ULTRASOUND TECHNIQUE: Pearline Cables scale imaging, color Doppler and duplex ultrasound was performed of bilateral carotid and vertebral arteries in the neck. COMPARISON:  None. TECHNIQUE: Quantification of carotid stenosis is based on velocity parameters that correlate the residual internal carotid diameter with NASCET-based stenosis levels, using the diameter of the distal internal carotid lumen as the denominator for stenosis measurement. The following velocity measurements were obtained: PEAK SYSTOLIC/END DIASTOLIC RIGHT ICA:                     145/17cm/sec CCA:                     48/88BV/QXI SYSTOLIC ICA/CCA RATIO:  1.5 DIASTOLIC ICA/CCA RATIO: 1.4 ECA:                     170cm/sec LEFT ICA:                     161/21cm/sec CCA:                     50/38UE/KCM SYSTOLIC ICA/CCA RATIO:  1.7 DIASTOLIC ICA/CCA RATIO: 1.9 ECA:                     106cm/sec FINDINGS: RIGHT CAROTID ARTERY: Eccentric calcified plaque in the mid and distal common carotid artery without high-grade stenosis. Heavier irregular eccentric partially calcified plaque in the carotid bulb extending across the bifurcation into the proximal ICA resulting in at least mild stenosis. Mildly elevated peak systolic velocities just beyond the bifurcation. Elsewhere normal waveforms and color Doppler signal. RIGHT VERTEBRAL ARTERY:  Normal flow direction and waveform. LEFT CAROTID ARTERY: Scattered nonocclusive plaque in the common carotid artery. Eccentric partially calcified plaque at the carotid bifurcation involving proximal internal and external carotid arteries resulting in at least mild stenosis. The reported peak systolic velocities are probably overestimated due to poor angle correction. Normal waveforms and color Doppler signal. LEFT  VERTEBRAL ARTERY: Normal flow direction and waveform. IMPRESSION: 1. 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 The exam does not exclude plaque ulceration or embolization. Continued surveillance recommended. 2.  Antegrade bilateral vertebral arterial flow. Electronically Signed   By: Lucrezia Europe M.D.   On: 01/24/2017 17:03     Management plans discussed with the patient, family and they are in agreement.  CODE STATUS: Full Code   TOTAL TIME TAKING CARE OF THIS PATIENT: 45 minutes.    Max Sane M.D on 01/25/2017 at 11:17 AM  Between 7am to 6pm - Pager - [phone]  After 6pm go to [email or web address] - Proofreader  [facility]  Office  [phone]  CC: Primary care physician; [name], MD   Note: This dictation was prepared with Dragon dictation along with smaller phrase technology. Any transcriptional errors that result from this process are unintentional.

## 2017-01-26 ENCOUNTER — Inpatient Hospital Stay
Admit: 2017-01-26 | Discharge: 2017-01-26 | Disposition: A | Discharge status: Home / Self Care | Payer: Medicare Other | Attending: Internal Medicine | Admitting: Internal Medicine

## 2017-01-26 LAB — BASIC METABOLIC PANEL
ANION GAP: 15 (ref 5–15)
BUN: 40 mg/dL — ABNORMAL HIGH (ref 6–20)
CO2: 26 mmol/L (ref 22–32)
CREATININE: 7.08 mg/dL — AB (ref 0.44–1.00)
Calcium: 7.4 mg/dL — ABNORMAL LOW (ref 8.9–10.3)
Chloride: 94 mmol/L — ABNORMAL LOW (ref 101–111)
GFR calc non Af Amer: 5 mL/min — ABNORMAL LOW (ref >60)
GFR, EST AFRICAN AMERICAN: 6 mL/min — AB (ref >60)
Glucose, Bld: 83 mg/dL (ref 65–99)
POTASSIUM: 4.3 mmol/L (ref 3.5–5.1)
SODIUM: 135 mmol/L (ref 135–145)

## 2017-01-26 LAB — CBC
HEMATOCRIT: 29.5 % — AB (ref 35.0–47.0)
HEMOGLOBIN: 9.5 g/dL — AB (ref 12.0–16.0)
MCH: 34.2 pg — ABNORMAL HIGH (ref 26.0–34.0)
MCHC: 32.3 g/dL (ref 32.0–36.0)
MCV: 106 fL — ABNORMAL HIGH (ref 80.0–100.0)
Platelets: 118 10*3/uL — ABNORMAL LOW (ref 150–440)
RBC: 2.78 MIL/uL — AB (ref 3.80–5.20)
RDW: 17.4 % — ABNORMAL HIGH (ref 11.5–14.5)
WBC: 6.2 10*3/uL (ref 3.6–11.0)

## 2017-01-26 LAB — GLUCOSE, CAPILLARY
GLUCOSE-CAPILLARY: 79 mg/dL (ref 65–99)
GLUCOSE-CAPILLARY: 116 mg/dL — AB (ref 65–99)
GLUCOSE-CAPILLARY: 82 mg/dL (ref 65–99)

## 2017-01-26 MED ORDER — OSELTAMIVIR PHOSPHATE 30 MG PO CAPS: 30.0000 mg | ORAL_CAPSULE | Freq: Two times a day (BID) | ORAL | 0 refills | Status: DC

## 2017-01-26 MED ORDER — EPOETIN ALFA 10000 UNIT/ML IJ SOLN
10000.0000 [IU] | INTRAMUSCULAR | Status: DC
  Administered 2017-01-26: 10000 [IU] via INTRAVENOUS
  Filled 2017-01-26 (×2): qty 1

## 2017-01-26 MED ORDER — CLOPIDOGREL BISULFATE 75 MG PO TABS
75.0000 mg | ORAL_TABLET | Freq: Every day | ORAL | Status: DC
  Administered 2017-01-26: 75 mg via ORAL
  Filled 2017-01-26: qty 1

## 2017-01-26 MED ORDER — CLOPIDOGREL BISULFATE 75 MG PO TABS: 75.0000 mg | ORAL_TABLET | Freq: Every day | ORAL | 0 refills | Status: AC

## 2017-01-26 MED ORDER — OSELTAMIVIR PHOSPHATE 30 MG PO CAPS: 30.0000 mg | ORAL_CAPSULE | Freq: Once | ORAL | 0 refills | Status: AC

## 2017-01-26 NOTE — Progress Notes (Signed)
HD STARTED  

## 2017-01-26 NOTE — Evaluation (Signed)
Occupational Therapy Evaluation Patient Details Name: Laurie Steele MRN: [id number] DOB: Sep 22, 1941 Today's Date: 01/26/2017    History of Present Illness Pt. is a 76 y.o. F presenting to the ED from dialysis on 01/22/18 with altered mental status, nausea/vomitting, and dry. unproductive cough. Pt. found to have UTI and positive test for influenza upon admitting. PMH of CHF, CAD, HTN, DM with neuropathy and retinopathy, renal failure, LBP with hx of 2 back sugeries, L AV fistula (brachiocephalic), and R IJ permcath. - pt had MRI yesterday that showed acute infarct - pt report having weaknessin R UE and LE - but cannot say if it is the last few days or prior to admission    Clinical Impression   Pt present with decrease strength , sensation , coordination in R dominant UE - decrease activity tolerance, functional transfers- all limiting her independence to perform ADL's and IADL's -  Pt can benefit from OT services in this setting - recommend rehab after discharge     Follow Up Recommendations       Equipment Recommendations       Recommendations for Other Services       Precautions / Restrictions Precautions Precautions: Fall;Other (comment) Precaution Comments: L AV fistula; R IJ permcath Restrictions Weight Bearing Restrictions: No      Mobility Bed Mobility               General bed mobility comments: Max A supine <> sit   Transfers                      Balance Overall balance assessment: History of Falls Sitting-balance support: Feet supported;Single extremity supported;Bilateral upper extremity supported Sitting balance-Leahy Scale: Poor Sitting balance - Comments: Pt sitting EOB 10 min  support with L UE , if unsupported positive LOB -                                    ADL either performed or assessed with clinical judgement   ADL                                         General ADL Comments: Eating max A with  feeding , grooming Max A, dressing max A, bathing Max A , catheter      Vision Baseline Vision/History: Wears glasses Wears Glasses: At all times Patient Visual Report: No change from baseline Additional Comments: Did visual screen for tracking and crossing midline- WNL  as well as quadrants - WNL      Perception     Praxis      Pertinent Vitals/Pain Pain Assessment: No/denies pain     Hand Dominance Right   Extremity/Trunk Assessment             Communication Communication Communication: No difficulties   Cognition Arousal/Alertness: Lethargic Behavior During Therapy: Flat affect                                   General Comments: Pt kept eyes close most of the time - but follow directions , and answers appropriate - delay    General Comments       Exercises Other Exercises Other Exercises: Assessment of UE - L UE WFL but  strength 4-/5 Other Exercises: R UE , shoulder extention WFL, flexion and ABD 1/5; elbow 2+flexion and extention ,sup 3; wrist extention 3; able to make grip but weak  Other Exercises: Impaired sensation per pt - R UE compare to L feels numb Other Exercises: Coordination - Opposition to finger tips slow    Shoulder Instructions      Home Living Family/patient expects to be discharged to:: Private residence Living Arrangements: Other (Comment) (live with son) Available Help at Discharge: Family;Available PRN/intermittently Type of Home: House       Home Layout: One level     Bathroom Shower/Tub: Tub/shower unit (but she takes sponge baths)   Bathroom Toilet: Standard     Home Equipment: Walker - 4 wheels          Prior Functioning/Environment      ADL's / Homemaking Assistance Needed: Per pt she takes sponge baths and family help with bathing her back and dressing footwear   Comments: Pt. reports that she lives with her son who is with her most of the day, but does leave intermittently. Pt. also reports that her  other son comes to stay with her, but did not report 24/7 assistence.         OT Problem List: Decreased strength;Decreased range of motion;Decreased activity tolerance;Impaired balance (sitting and/or standing);Decreased safety awareness;Decreased coordination;Impaired sensation;Impaired UE functional use      OT Treatment/Interventions: Self-care/ADL training;Balance training;Therapeutic exercise;Neuromuscular education;Therapeutic activities;Patient/family education    OT Goals(Current goals can be found in the care plan section) Acute Rehab OT Goals Patient Stated Goal: Pt aware - and open to go to rehab OT Goal Formulation: With patient Time For Goal Achievement: 02/16/17 Potential to Achieve Goals: Fair (Neuropathy, dialysis, had flu and stroke)  OT Frequency: Min 3X/week   Barriers to D/C:            Co-evaluation              End of Session    Activity Tolerance: Patient limited by fatigue Patient left: in bed;with nursing/sitter in room  OT Visit Diagnosis: Muscle weakness (generalized) (M62.81);History of falling (Z91.81);Feeding difficulties (R63.3);Hemiplegia and hemiparesis Hemiplegia - Right/Left: Right Hemiplegia - dominant/non-dominant: Dominant                Time: 1125-1150 OT Time Calculation (min): 25 min Charges:  OT General Charges $OT Visit: 1 Procedure OT Evaluation $OT Eval Moderate Complexity: 1 Procedure G-Codes:       Rosalyn Gess OTR/L,CLT 01/26/2017, 12:10 PM

## 2017-01-26 NOTE — Progress Notes (Signed)
Daughter Melody called back and stated that she prefers that pt be transported via EMS

## 2017-01-26 NOTE — Discharge Instructions (Signed)
Follow-up with primary care physician at the facility in 3-5 days   outpatient follow-up with neurology in 2 weeks and continue physical therapy     Influenza, Adult Influenza, more commonly known as the flu, is a viral infection that primarily affects the respiratory tract. The respiratory tract includes organs that help you breathe, such as the lungs, nose, and throat. The flu causes many common cold symptoms, as well as a high fever and body aches. The flu spreads easily from person to person (is contagious). Getting a flu shot (influenza vaccination) every year is the best way to prevent influenza. What are the causes? Influenza is caused by a virus. You can catch the virus by:  Breathing in droplets from an infected person's cough or sneeze.  Touching something that was recently contaminated with the virus and then touching your mouth, nose, or eyes. What increases the risk? The following factors may make you more likely to get the flu:  Not cleaning your hands frequently with soap and water or alcohol-based hand sanitizer.  Having close contact with many people during cold and flu season.  Touching your mouth, eyes, or nose without washing or sanitizing your hands first.  Not drinking enough fluids or not eating a healthy diet.  Not getting enough sleep or exercise.  Being under a high amount of stress.  Not getting a yearly (annual) flu shot. You may be at a higher risk of complications from the flu, such as a severe lung infection (pneumonia), if you:  Are over the age of 24.  Are pregnant.  Have a weakened disease-fighting system (immune system). You may have a weakened immune system if you:  Have HIV or AIDS.  Are undergoing chemotherapy.  Aretaking medicines that reduce the activity of (suppress) the immune system.  Have a long-term (chronic) illness, such as heart disease, kidney disease, diabetes, or lung disease.  Have a liver disorder.  Are  obese.  Have anemia. What are the signs or symptoms? Symptoms of this condition typically last 4-10 days and may include:  Fever.  Chills.  Headache, body aches, or muscle aches.  Sore throat.  Cough.  Runny or congested nose.  Chest discomfort and cough.  Poor appetite.  Weakness or tiredness (fatigue).  Dizziness.  Nausea or vomiting. How is this diagnosed? This condition may be diagnosed based on your medical history and a physical exam. Your health care provider may do a nose or throat swab test to confirm the diagnosis. How is this treated? If influenza is detected early, you can be treated with antiviral medicine that can reduce the length of your illness and the severity of your symptoms. This medicine may be given by mouth (orally) or through an IV tube that is inserted in one of your veins. The goal of treatment is to relieve symptoms by taking care of yourself at home. This may include taking over-the-counter medicines, drinking plenty of fluids, and adding humidity to the air in your home. In some cases, influenza goes away on its own. Severe influenza or complications from influenza may be treated in a hospital. Follow these instructions at home:  Take over-the-counter and prescription medicines only as told by your health care provider.  Use a cool mist humidifier to add humidity to the air in your home. This can make breathing easier.  Rest as needed.  Drink enough fluid to keep your urine clear or pale yellow.  Cover your mouth and nose when you cough or sneeze.  Wash your hands with soap and water often, especially after you cough or sneeze. If soap and water are not available, use hand sanitizer.  Stay home from work or school as told by your health care provider. Unless you are visiting your health care provider, try to avoid leaving home until your fever has been gone for 24 hours without the use of medicine.  Keep all follow-up visits as told by  your health care provider. This is important. How is this prevented?  Getting an annual flu shot is the best way to avoid getting the flu. You may get the flu shot in late summer, fall, or winter. Ask your health care provider when you should get your flu shot.  Wash your hands often or use hand sanitizer often.  Avoid contact with people who are sick during cold and flu season.  Eat a healthy diet, drink plenty of fluids, get enough sleep, and exercise regularly. Contact a health care provider if:  You develop new symptoms.  You have:  Chest pain.  Diarrhea.  A fever.  Your cough gets worse.  You produce more mucus.  You feel nauseous or you vomit. Get help right away if:  You develop shortness of breath or difficulty breathing.  Your skin or nails turn a bluish color.  You have severe pain or stiffness in your neck.  You develop a sudden headache or sudden pain in your face or ear.  You cannot stop vomiting. This information is not intended to replace advice given to you by your health care provider. Make sure you discuss any questions you have with your health care provider. Document Released: 10/19/2000 Document Revised: 03/29/2016 Document Reviewed: 08/16/2015 Elsevier Interactive Patient Education  2017 [employer].

## 2017-01-26 NOTE — Progress Notes (Signed)
EMS called for transport.

## 2017-01-26 NOTE — Progress Notes (Signed)
POST DIALYSIS ASSESSMENT 

## 2017-01-26 NOTE — Progress Notes (Signed)
Pt d/c; pt left unit via EMS. Pt belongings taken by EMS

## 2017-01-26 NOTE — Plan of Care (Signed)
Problem: Education: Goal: Knowledge of [facility] Education information/materials will improve Outcome: Not Progressing Pt confused to situation  Problem: Health Behavior/Discharge Planning: Goal: Ability to manage health-related needs will improve Outcome: Not Progressing Pt requires assistance

## 2017-01-26 NOTE — Discharge Summary (Addendum)
[facility] [place] NAME: Laurie Steele    MR#:  [id number]  DATE OF BIRTH:  04-27-1941  DATE OF ADMISSION:  01/22/2017 ADMITTING PHYSICIAN: Hillary Bow, MD  DATE OF DISCHARGE: 01/26/17  PRIMARY CARE PHYSICIAN: [name], MD    ADMISSION DIAGNOSIS:  Lethargy [R53.83] Elevated serum lactate dehydrogenase [R74.0] Urinary tract infection without hematuria, site unspecified [N39.0] Nausea and vomiting, intractability of vomiting not specified, unspecified vomiting type [R11.2]  DISCHARGE DIAGNOSIS:  Active Problems:   UTI (urinary tract infection)   SECONDARY DIAGNOSIS:   Past Medical History:  Diagnosis Date  . CAD (coronary artery disease)   . Chronic anemia    Dr Inez Pilgrim  . GERD (gastroesophageal reflux disease)   . Glaucoma   . Gout   . Hx of colonic polyps   . Hyperlipidemia   . Hypertension   . Lower back pain   . Miscarriage   . 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 Peritoneal dialysis status ([place])   . Renal failure    Dr Holley Raring  . 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 Vaginal delivery    x 4    HOSPITAL COURSE:  HPI  : Laurie Steele  is a 76 y.o. female with a known history of Anterior disease, memory problems, prior history of UTI with septic coccus bacteremia presents to the emergency room due to fever of 100.5 and the dialysis center along with confusion and weakness. Here patient has been found to have elevated lactic acid with confusion. Also UTI. Not much history can be obtained from the patient. She seems to have worsening memory problems over the past few months according to the daughter.  Hospital course   * Sepsis: present on admission secondary influenza - Continue Tamiflu  -Patient was given IV Rocephin for possible UTI but urine culture has revealed multiple  species and blood cultures are negative. ID has recommended to discontinue IV Rocephin and just to continue Tamiflu until the course is completed -  patient clinically is doing fine  - ID input appreciated  * Acute stroke - MRI shows 1 cm acute infarction in the left hemispheric deep white matter adjacent to the posterior body of the left lateral ventricle. Few other punctate foci of acute infarction in the left frontoparietal brain. This raises the possibility of micro embolic infarctions. MRA with right ICA with 50% stenosis and left side with 30% stenosis -Patient echocardiogram was performed on February 6 has revealed 40-45% ejection fractioN -Neurology has recommended to discharge patient with aspirin 81 mg and Plavix 75 mg. Continue Lipitor. - Patient has passed bedside swallow evaluation -Physical therapy has recommended skilled nursing facility  * Influenza - on Tamiflu, patient needs to get one more dose of 30 mg of Tamiflu during next dialysis on Monday, 01/28/2017 to complete the course as per minute discussion with the pharmacist Sheema  * Generalized weakness: has some rt>lt weakness - Multifactorial - Physical therapy recommends short-term rehabilitation.  * Acute metabolic encephalopathy over dementia due to UTI & Acute stroke. Fall precautions. Watch for inpatient delirium.  * End-stage renal disease on hemodialysis: Dialysis per nephrology.  * Anemia of chronic disease is stable  * Chronic respiratory failure with pulse ox dropping down into the 80s with ambulation and overnight. Care manager to set up continuous oxygen at  home. Continue oxygen supplementation as need.  * Chronic diastolic congestive heart failure: well compensated at this time  * Likely sleep apnea. Will need sleep study as outpatient. Overnight pulse oximetry showed that the patient qualifies for oxygen at night at home.  * Essential hypertension continue clonidine, hydralazine, Imdur,  Toprol and lasix  * Type 2 diabetes mellitus- on sliding scale  * GERD on Protonix  * Hyperlipidemia: on pravastatin  * Diabetic neuropathy on gabapentin  * History of gout on renally dosed allopurinol and colchicin    DISCHARGE CONDITIONS:   Stable   CONSULTS OBTAINED:  Treatment Team:  Lavonia Dana, MD Leonel Ramsay, MD Catarina Hartshorn, MD Alexis Goodell, MD   PROCEDURES none  DRUG ALLERGIES:   Allergies  Allergen Reactions  . Alprazolam Other (See Comments)    Unable to sleep and confusion    DISCHARGE MEDICATIONS:   Current Discharge Medication List    START taking these medications   Details  clopidogrel (PLAVIX) 75 MG tablet Take 1 tablet (75 mg total) by mouth daily. Qty: 30 tablet, Refills: 0    feeding supplement, ENSURE ENLIVE, (ENSURE ENLIVE) LIQD Take 237 mLs by mouth 2 (two) times daily between meals. Qty: 237 mL, Refills: 12    oseltamivir (TAMIFLU) 30 MG capsule Take 1 capsule (30 mg total) by mouth once. Qty: 1 capsule, Refills: 0      CONTINUE these medications which have NOT CHANGED   Details  acetaminophen (TYLENOL) 650 MG CR tablet Take 650 mg by mouth every 8 (eight) hours as needed for pain.    allopurinol (ZYLOPRIM) 100 MG tablet Take 200 tablets by mouth at bedtime.     aspirin EC 81 MG tablet Take 1 tablet (81 mg total) by mouth daily. Qty: 30 tablet, Refills: 1    cloNIDine (CATAPRES) 0.1 MG tablet Take 0.1 mg by mouth See admin instructions. tk 1 tab po bid on sun, mon, wed, and fri, then tk 1 tab qhs on tues, thurs, and sat    colchicine 0.6 MG tablet Take 0.5 tablets (0.3 mg total) by mouth 2 (two) times a week. Qty: 4 tablet, Refills: 2    cyclobenzaprine (FLEXERIL) 5 MG tablet Take 5 mg by mouth as needed.    Associated Diagnoses: Symptomatic anemia    diclofenac sodium (VOLTAREN) 1 % GEL Apply 2 g four times a day PRN Not a candidate for oral NSAIDS, ESRD on PD   Associated Diagnoses: Symptomatic  anemia    docusate sodium (COLACE) 100 MG capsule Take 1 capsule (100 mg total) by mouth daily as needed for mild constipation. Qty: 30 capsule, Refills: 0    famotidine (PEPCID) 20 MG tablet Take 20 mg by mouth at bedtime.     ferrous sulfate 325 (65 FE) MG tablet Take 1 tablet (325 mg total) by mouth daily with breakfast. Qty: 30 tablet, Refills: 3    fluticasone (FLONASE) 50 MCG/ACT nasal spray Place 2 sprays into both nostrils daily.    Associated Diagnoses: Symptomatic anemia    furosemide (LASIX) 40 MG tablet Take 1 tablet (40 mg total) by mouth daily. Qty: 30 tablet, Refills: 2    gabapentin (NEURONTIN) 100 MG capsule Take 1 capsule (100 mg total) by mouth at bedtime. Qty: 30 capsule, Refills: 0    hydrALAZINE (APRESOLINE) 50 MG tablet Take 50 mg by mouth See admin instructions. tk 1 tab tid on sun, mon, wed, and fri, then tk 1 tab qhs on tues, thurs and sat  insulin aspart (NOVOLOG) 100 UNIT/ML injection Inject 0-5 Units into the skin at bedtime. Qty: 10 mL, Refills: 11    ipratropium (ATROVENT) 0.03 % nasal spray Place 1 spray into both nostrils 3 (three) times daily as needed for rhinitis.    ipratropium-albuterol (DUONEB) 0.5-2.5 (3) MG/3ML SOLN Take 3 mLs by nebulization every 6 (six) hours as needed. Qty: 360 mL, Refills: 0    metoprolol succinate (TOPROL-XL) 25 MG 24 hr tablet Take 1 tablet (25 mg total) by mouth at bedtime. Qty: 30 tablet, Refills: 0    multivitamin (RENA-VIT) TABS tablet Take 1 tablet by mouth daily.    pantoprazole (PROTONIX) 40 MG tablet Take 1 tablet (40 mg total) by mouth daily. Qty: 30 tablet, Refills: 1    pravastatin (PRAVACHOL) 40 MG tablet Take 40 mg by mouth at bedtime.    Associated Diagnoses: Symptomatic anemia    amitriptyline (ELAVIL) 25 MG tablet Take 1 tablet by mouth at bedtime as needed (For foot pain).  Refills: 3    amLODipine (NORVASC) 10 MG tablet Take 10 mg by mouth daily. Refills: 11    cinacalcet (SENSIPAR) 90  MG tablet Take 90 mg by mouth daily. With lunch    isosorbide mononitrate (IMDUR) 30 MG 24 hr tablet Take 30 mg by mouth 2 (two) times daily. Refills: 3    latanoprost (XALATAN) 0.005 % ophthalmic solution Place 1 drop into both eyes at bedtime. Refills: 6    sevelamer (RENVELA) 800 MG tablet Take 800 mg by mouth 3 (three) times daily with meals.          DISCHARGE INSTRUCTIONS:   Follow-up with primary care physician at the facility in 3-5 days   outpatient follow-up with neurology in 2 weeks and continue physical therapy Follow-up with vascular surgery Dr. Delana Meyer in 2 weeks  DIET:  Cardiac  DISCHARGE CONDITION:  Fair  ACTIVITY:  Activity as tolerated PER pt  OXYGEN:  Home Oxygen: No.   Oxygen Delivery: room air  DISCHARGE LOCATION:  nursing home   If you experience worsening of your admission symptoms, develop shortness of breath, life threatening emergency, suicidal or homicidal thoughts you must seek medical attention immediately by calling 911 or calling your MD immediately  if symptoms less severe.  You Must read complete instructions/literature along with all the possible adverse reactions/side effects for all the Medicines you take and that have been prescribed to you. Take any new Medicines after you have completely understood and accpet all the possible adverse reactions/side effects.   Please note  You were cared for by a hospitalist during your hospital stay. If you have any questions about your discharge medications or the care you received while you were in the hospital after you are discharged, you can call the unit and asked to speak with the hospitalist on call if the hospitalist that took care of you is not available. Once you are discharged, your primary care physician will handle any further medical issues. Please note that NO REFILLS for any discharge medications will be authorized once you are discharged, as it is imperative that you return to your  primary care physician (or establish a relationship with a primary care physician if you do not have one) for your aftercare needs so that they can reassess your need for medications and monitor your lab values.     Today  Chief Complaint  Patient presents with  . Altered Mental Status     ROS:  CONSTITUTIONAL: Denies fevers, chills. Reports  fatigue, weakness.  EYES: Denies blurry vision, double vision, eye pain. EARS, NOSE, THROAT: Denies tinnitus, ear pain, hearing loss. RESPIRATORY: Denies cough, wheeze, shortness of breath.  CARDIOVASCULAR: Denies chest pain, palpitations, edema.  GASTROINTESTINAL: Denies nausea, vomiting, diarrhea, abdominal pain. Denies bright red blood per rectum. GENITOURINARY: Denies dysuria, hematuria. ENDOCRINE: Denies nocturia or thyroid problems. HEMATOLOGIC AND LYMPHATIC: Denies easy bruising or bleeding. SKIN: Denies rash or lesion. MUSCULOSKELETAL: Denies pain in neck, back, shoulder, knees, hips or arthritic symptoms.  NEUROLOGIC: Denies paralysis, paresthesias. Has right-sided weakness  PSYCHIATRIC: Denies anxiety or depressive symptoms.   VITAL SIGNS:  Blood pressure (!) 127/39, pulse 65, temperature 97.8 F (36.6 C), temperature source Oral, resp. rate 20, height 5\' 7"  (1.702 m), weight 82.1 kg (181 lb), SpO2 94 %.  I/O:    Intake/Output Summary (Last 24 hours) at 01/26/17 1446 Last data filed at 01/26/17 1322  Gross per 24 hour  Intake              100 ml  Output                0 ml  Net              100 ml    PHYSICAL EXAMINATION:  GENERAL:  76 y.o.-year-old patient lying in the bed with no acute distress.  EYES: Pupils equal, round, reactive to light and accommodation. No scleral icterus. Extraocular muscles intact.  HEENT: Head atraumatic, normocephalic. Oropharynx and nasopharynx clear.  NECK:  Supple, no jugular venous distention. No thyroid enlargement, no tenderness.  LUNGS: Normal breath sounds bilaterally, no wheezing,  rales,rhonchi or crepitation. No use of accessory muscles of respiration.  CARDIOVASCULAR: S1, S2 normal. No murmurs, rubs, or gallops.  ABDOMEN: Soft, non-tender, non-distended. Bowel sounds present. No organomegaly or mass.  EXTREMITIES: No pedal edema, cyanosis, or clubbing.  NEUROLOGIC: Significant right-sided weakness than on the left, alert and oriented 3  PSYCHIATRIC: The patient is alert and oriented x 3.  SKIN: No obvious rash, lesion, or ulcer.   DATA REVIEW:   CBC  Recent Labs Lab 01/26/17 0451  WBC 6.2  HGB 9.5*  HCT 29.5*  PLT 118*    Chemistries   Recent Labs Lab 01/22/17 1812  01/26/17 0451  NA 135  < > 135  K 3.1*  < > 4.3  CL 96*  < > 94*  CO2 25  < > 26  GLUCOSE 212*  < > 83  BUN 19  < > 40*  CREATININE 4.28*  < > 7.08*  CALCIUM 8.5*  < > 7.4*  AST 73*  --   --   ALT 39  --   --   ALKPHOS 85  --   --   BILITOT 0.9  --   --   < > = values in this interval not displayed.  Cardiac Enzymes No results for input(s): TROPONINI in the last 168 hours.  Microbiology Results  Results for orders placed or performed during the hospital encounter of 01/22/17  Urine culture     Status: Abnormal   Collection Time: 01/22/17  5:00 PM  Result Value Ref Range Status   Specimen Description URINE, RANDOM  Final   Special Requests NONE  Final   Culture MULTIPLE SPECIES PRESENT, SUGGEST RECOLLECTION (A)  Final   Report Status 01/24/2017 FINAL  Final  Blood Culture (routine x 2)     Status: None (Preliminary result)   Collection Time: 01/22/17  5:01 PM  Result Value Ref  Range Status   Specimen Description BLOOD RIGHT AC  Final   Special Requests BOTTLES DRAWN AEROBIC AND ANAEROBIC BCAV  Final   Culture NO GROWTH 4 DAYS  Final   Report Status PENDING  Incomplete  Blood Culture (routine x 2)     Status: None (Preliminary result)   Collection Time: 01/22/17  5:01 PM  Result Value Ref Range Status   Specimen Description BLOOD RIGHT HAND  Final   Special Requests  BOTTLES DRAWN AEROBIC AND ANAEROBIC BCAV  Final   Culture NO GROWTH 4 DAYS  Final   Report Status PENDING  Incomplete  MRSA PCR Screening     Status: None   Collection Time: 01/24/17  8:15 AM  Result Value Ref Range Status   MRSA by PCR NEGATIVE NEGATIVE Final    Comment:        The GeneXpert MRSA Assay (FDA approved for NASAL specimens only), is one component of a comprehensive MRSA colonization surveillance program. It is not intended to diagnose MRSA infection nor to guide or monitor treatment for MRSA infections.     RADIOLOGY:  Ct Head Wo Contrast  Result Date: 01/23/2017 CLINICAL DATA:  Confusion and fever.  End-stage renal disease EXAM: CT HEAD WITHOUT CONTRAST TECHNIQUE: Contiguous axial images were obtained from the base of the skull through the vertex without intravenous contrast. COMPARISON:  None. FINDINGS: Brain: There is mild diffuse atrophy. There is no intracranial mass, hemorrhage, extra-axial fluid collection, or midline shift. There is mild small vessel disease in the centra semiovale bilaterally. Elsewhere gray-white compartments are normal. There is no evident acute infarct. Vascular: No hyperdense vessel. There are foci of calcification in both carotid siphon regions. There is also calcification in both distal vertebral arteries. Skull: Bony calvarium appears intact. Sinuses/Orbits: Visualized paranasal sinuses are clear. Visualized orbits appear symmetric bilaterally. Other: Visualized mastoid air cells are clear. IMPRESSION: Mild atrophy with mild periventricular small vessel disease. No intracranial mass, hemorrhage, or extra-axial fluid collection. There are multiple foci of arterial vascular calcification. Electronically Signed   By: Lowella Grip III M.D.   On: 01/23/2017 15:48   Mr Jodene Nam Head Wo Contrast  Result Date: 01/25/2017 CLINICAL DATA:  Fever. Memory disturbance. Dialysis patient. Confusion and weakness. EXAM: MRI HEAD WITHOUT CONTRAST MRA HEAD  WITHOUT CONTRAST TECHNIQUE: Multiplanar, multiecho pulse sequences of the brain and surrounding structures were obtained without intravenous contrast. Angiographic images of the head were obtained using MRA technique without contrast. COMPARISON:  CT 01/23/2017 FINDINGS: MRI HEAD FINDINGS Brain: Diffusion imaging shows a 1 cm acute infarction in the deep white matter adjacent to the posterior body of the left lateral ventricle. There are a few other smaller punctate acute infarctions in the left parietal/ frontoparietal region. Elsewhere, there are chronic small-vessel ischemic changes affecting the pons. There are a few old small vessel cerebellar infarctions. Cerebral hemispheres show mild chronic small-vessel ischemic change elsewhere. No cortical/large vessel territory infarction. No mass lesion, hemorrhage, hydrocephalus or extra-axial collection. Vascular: Major vessels at the base of the brain show flow. Skull and upper cervical spine: Negative Sinuses/Orbits: Sinuses are clear. Orbits are negative. Small mastoid effusions. Other: None MRA HEAD FINDINGS Both internal carotid arteries are patent through the skullbase and siphon regions. There is atherosclerotic irregularity in the carotid siphon regions. Maximal stenosis on the right is estimated at 50%. Maximal stenosis on the left is estimated at 30%. Supraclinoid internal carotid arteries are widely patent. Anterior and middle cerebral vessels are widely patent. Distal vessels do not  show pronounced irregularity. Both vertebral arteries are patent to the basilar with the right being dominant. There is atherosclerotic irregularity of the basilar artery with maximal stenosis of 30%. Superior cerebellar and posterior cerebral arteries are patent and appear normal. IMPRESSION: 1 cm acute infarction in the left hemispheric deep white matter adjacent to the posterior body of the left lateral ventricle. Few other punctate foci of acute infarction in the left  frontoparietal brain. This raises the possibility of micro embolic infarctions. Mild chronic small-vessel ischemic change elsewhere affecting the brain. Atherosclerotic disease in both carotid siphon regions. 50% stenosis on the right. 30% stenosis on the left. 30% stenosis of the basilar artery. The intracranial branch vessels appear patent and normal. Electronically Signed   By: Nelson Chimes M.D.   On: 01/25/2017 12:50   Mr Brain Wo Contrast  Result Date: 01/25/2017 CLINICAL DATA:  Fever. Memory disturbance. Dialysis patient. Confusion and weakness. EXAM: MRI HEAD WITHOUT CONTRAST MRA HEAD WITHOUT CONTRAST TECHNIQUE: Multiplanar, multiecho pulse sequences of the brain and surrounding structures were obtained without intravenous contrast. Angiographic images of the head were obtained using MRA technique without contrast. COMPARISON:  CT 01/23/2017 FINDINGS: MRI HEAD FINDINGS Brain: Diffusion imaging shows a 1 cm acute infarction in the deep white matter adjacent to the posterior body of the left lateral ventricle. There are a few other smaller punctate acute infarctions in the left parietal/ frontoparietal region. Elsewhere, there are chronic small-vessel ischemic changes affecting the pons. There are a few old small vessel cerebellar infarctions. Cerebral hemispheres show mild chronic small-vessel ischemic change elsewhere. No cortical/large vessel territory infarction. No mass lesion, hemorrhage, hydrocephalus or extra-axial collection. Vascular: Major vessels at the base of the brain show flow. Skull and upper cervical spine: Negative Sinuses/Orbits: Sinuses are clear. Orbits are negative. Small mastoid effusions. Other: None MRA HEAD FINDINGS Both internal carotid arteries are patent through the skullbase and siphon regions. There is atherosclerotic irregularity in the carotid siphon regions. Maximal stenosis on the right is estimated at 50%. Maximal stenosis on the left is estimated at 30%. Supraclinoid  internal carotid arteries are widely patent. Anterior and middle cerebral vessels are widely patent. Distal vessels do not show pronounced irregularity. Both vertebral arteries are patent to the basilar with the right being dominant. There is atherosclerotic irregularity of the basilar artery with maximal stenosis of 30%. Superior cerebellar and posterior cerebral arteries are patent and appear normal. IMPRESSION: 1 cm acute infarction in the left hemispheric deep white matter adjacent to the posterior body of the left lateral ventricle. Few other punctate foci of acute infarction in the left frontoparietal brain. This raises the possibility of micro embolic infarctions. Mild chronic small-vessel ischemic change elsewhere affecting the brain. Atherosclerotic disease in both carotid siphon regions. 50% stenosis on the right. 30% stenosis on the left. 30% stenosis of the basilar artery. The intracranial branch vessels appear patent and normal. Electronically Signed   By: Nelson Chimes M.D.   On: 01/25/2017 12:50   US Carotid Bilateral  Result Date: 01/24/2017 CLINICAL DATA:  Stroke. Hypertension, coronary artery disease, previous tobacco abuse. EXAM: BILATERAL CAROTID DUPLEX ULTRASOUND TECHNIQUE: Pearline Cables scale imaging, color Doppler and duplex ultrasound was performed of bilateral carotid and vertebral arteries in the neck. COMPARISON:  None. TECHNIQUE: Quantification of carotid stenosis is based on velocity parameters that correlate the residual internal carotid diameter with NASCET-based stenosis levels, using the diameter of the distal internal carotid lumen as the denominator for stenosis measurement. The following velocity measurements were obtained:  PEAK SYSTOLIC/END DIASTOLIC RIGHT ICA:                     145/17cm/sec CCA:                     60/10XN/ATF SYSTOLIC ICA/CCA RATIO:  1.5 DIASTOLIC ICA/CCA RATIO: 1.4 ECA:                     170cm/sec LEFT ICA:                     161/21cm/sec CCA:                      57/32KG/URK SYSTOLIC ICA/CCA RATIO:  1.7 DIASTOLIC ICA/CCA RATIO: 1.9 ECA:                     106cm/sec FINDINGS: RIGHT CAROTID ARTERY: Eccentric calcified plaque in the mid and distal common carotid artery without high-grade stenosis. Heavier irregular eccentric partially calcified plaque in the carotid bulb extending across the bifurcation into the proximal ICA resulting in at least mild stenosis. Mildly elevated peak systolic velocities just beyond the bifurcation. Elsewhere normal waveforms and color Doppler signal. RIGHT VERTEBRAL ARTERY:  Normal flow direction and waveform. LEFT CAROTID ARTERY: Scattered nonocclusive plaque in the common carotid artery. Eccentric partially calcified plaque at the carotid bifurcation involving proximal internal and external carotid arteries resulting in at least mild stenosis. The reported peak systolic velocities are probably overestimated due to poor angle correction. Normal waveforms and color Doppler signal. LEFT VERTEBRAL ARTERY: Normal flow direction and waveform. IMPRESSION: 1. 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 The exam does not exclude plaque ulceration or embolization. Continued surveillance recommended. 2.  Antegrade bilateral vertebral arterial flow. Electronically Signed   By: Lucrezia Europe M.D.   On: 01/24/2017 17:03   Dg Chest Port 1 View  Result Date: 01/22/2017 CLINICAL DATA:  Cough, nausea and vomiting weakness. EXAM: PORTABLE CHEST 1 VIEW COMPARISON:  12/11/2016 FINDINGS: Central venous line with split tip catheters the RIGHT atrium. Normal cardiac silhouette. Mild central venous pulmonary congestion. No effusion infiltrate pneumothorax. IMPRESSION: Mild venous congestion. Electronically Signed   By: Suzy Bouchard M.D.   On: 01/22/2017 17:43    EKG:   Orders placed or performed during the hospital encounter of 01/22/17  . ED EKG 12-Lead  . ED EKG 12-Lead  . EKG 12-Lead  . EKG 12-Lead       Management plans discussed with the patient, family and they are in agreement.  CODE STATUS:     Code Status Orders        Start     Ordered   01/22/17 1902  Full code  Continuous     01/22/17 1902    Code Status History    Date Active Date Inactive Code Status Order ID Comments User Context   12/11/2016  1:44 PM 12/14/2016  5:05 PM Full Code [id number]  Gladstone Lighter, MD Inpatient   07/03/2016  8:15 PM 07/05/2016  7:43 PM Full Code [id number]  Vaughan Basta, MD Inpatient   06/15/2016  7:15 PM 06/18/2016  8:26 PM Full Code [id number]  Harvie Bridge, DO Inpatient   05/21/2016  4:28 PM 05/28/2016  6:11 PM Full Code [id number]  Henreitta Leber, MD Inpatient   04/05/2016  4:14 PM 04/06/2016  9:07 PM Full Code [id number]  Loletha Grayer, MD ED  03/14/2016 11:50 PM 03/16/2016  3:34 PM Full Code [id number]  Max Sane, MD Inpatient      TOTAL TIME TAKING CARE OF THIS PATIENT: 45  minutes.   Note: This dictation was prepared with Dragon dictation along with smaller phrase technology. Any transcriptional errors that result from this process are unintentional.   @[facility] @  on 01/26/2017 at 2:46 PM  Between 7am to 6pm - Pager - [phone]  After 6pm go to [email or web address] - password [email or web address] [facility]  Office  [phone]  CC: Primary care physician; [name], MD

## 2017-01-26 NOTE — Progress Notes (Signed)
Report called to Maralyn Sago, LPN at [facility]. Also informed her that pt was currently in dialysis and transport would occur following dialysis treatment

## 2017-01-26 NOTE — Progress Notes (Signed)
Daughter Melody notified that dialysis treatment finished and that EMS had been called for transport

## 2017-01-26 NOTE — Progress Notes (Signed)
[facility]  ROUNDING NOTE   Subjective:   Sister at bedside. Hemodialysis for later today.  Cough is improving.    Objective:  Vital signs in last 24 hours:  Temp:  [97.9 F (36.6 C)-98.6 F (37 C)] 97.9 F (36.6 C) (03/24 0443) Pulse Rate:  [81-103] 100 (03/24 0443) Resp:  [17-20] 20 (03/24 0443) BP: (103-120)/(38-50) 120/44 (03/24 0443) SpO2:  [90 %-95 %] 90 % (03/24 0443) Weight:  [77.6 kg (171 lb)] 77.6 kg (171 lb) (03/24 0450)  Weight change: -4.435 kg (-9 lb 12.4 oz) Filed Weights   01/24/17 1200 01/25/17 0627 01/26/17 0450  Weight: 82 kg (180 lb 12.4 oz) 83.1 kg (183 lb 4.8 oz) 77.6 kg (171 lb)    Intake/Output: I/O last 3 completed shifts: In: 400 [P.O.:400] Out: 375 [Urine:375]   Intake/Output this shift:  Total I/O In: 100 [P.O.:100] Out: 0   Physical Exam: General: Laying in bed  Head: Normocephalic, atraumatic. Moist oral mucosal membranes  Eyes: Anicteric, PERRL  Neck: Supple, trachea midline  Lungs:  Clear to auscultation  Heart: Regular rate and rhythm  Abdomen:  Soft, nontender,   Extremities:  no peripheral edema.  Neurologic: Nonfocal, moving all four extremities  Skin: No lesions  Access: RIJ permcath, left arm AVF    Basic Metabolic Panel:  Recent Labs Lab 01/22/17 1812 01/23/17 0849 01/24/17 0433 01/25/17 0420 01/26/17 0451  NA 135 138 135 138 135  K 3.1* 3.8 3.6 4.0 4.3  CL 96* 98* 97* 97* 94*  CO2 25 26 26 26 26   GLUCOSE 212* 112* 92 95 83  BUN 19 31* 40* 25* 40*  CREATININE 4.28* 5.80* 7.51* 5.65* 7.08*  CALCIUM 8.5* 8.6* 8.0* 8.1* 7.4*  PHOS  --   --  7.3*  --   --     Liver Function Tests:  Recent Labs Lab 01/22/17 1812  AST 73*  ALT 39  ALKPHOS 85  BILITOT 0.9  PROT 6.9  ALBUMIN 3.3*   No results for input(s): LIPASE, AMYLASE in the last 168 hours. No results for input(s): AMMONIA in the last 168 hours.  CBC:  Recent Labs Lab 01/22/17 1700 01/23/17 0849 01/24/17 0433 01/25/17 0420  01/26/17 0451  WBC 6.9 6.0 4.6 5.4 6.2  NEUTROABS 3.3  --   --   --   --   HGB 10.4* 9.1* 8.6* 9.4* 9.5*  HCT 31.9* 28.2* 26.3* 29.5* 29.5*  MCV 107.6* 105.9* 107.2* 106.3* 106.0*  PLT 206 147* 124* 126* 118*    Cardiac Enzymes: No results for input(s): CKTOTAL, CKMB, CKMBINDEX, TROPONINI in the last 168 hours.  BNP: Invalid input(s): POCBNP  CBG:  Recent Labs Lab 01/25/17 0803 01/25/17 1137 01/25/17 1647 01/25/17 2106 01/26/17 0728  GLUCAP 82 104* 109* 107* 35    Microbiology: Results for orders placed or performed during the hospital encounter of 01/22/17  Urine culture     Status: Abnormal   Collection Time: 01/22/17  5:00 PM  Result Value Ref Range Status   Specimen Description URINE, RANDOM  Final   Special Requests NONE  Final   Culture MULTIPLE SPECIES PRESENT, SUGGEST RECOLLECTION (A)  Final   Report Status 01/24/2017 FINAL  Final  Blood Culture (routine x 2)     Status: None (Preliminary result)   Collection Time: 01/22/17  5:01 PM  Result Value Ref Range Status   Specimen Description BLOOD RIGHT AC  Final   Special Requests BOTTLES DRAWN AEROBIC AND ANAEROBIC BCAV  Final  Culture NO GROWTH 4 DAYS  Final   Report Status PENDING  Incomplete  Blood Culture (routine x 2)     Status: None (Preliminary result)   Collection Time: 01/22/17  5:01 PM  Result Value Ref Range Status   Specimen Description BLOOD RIGHT HAND  Final   Special Requests BOTTLES DRAWN AEROBIC AND ANAEROBIC BCAV  Final   Culture NO GROWTH 4 DAYS  Final   Report Status PENDING  Incomplete  MRSA PCR Screening     Status: None   Collection Time: 01/24/17  8:15 AM  Result Value Ref Range Status   MRSA by PCR NEGATIVE NEGATIVE Final    Comment:        The GeneXpert MRSA Assay (FDA approved for NASAL specimens only), is one component of a comprehensive MRSA colonization surveillance program. It is not intended to diagnose MRSA infection nor to guide or monitor treatment for MRSA  infections.     Coagulation Studies: No results for input(s): LABPROT, INR in the last 72 hours.  Urinalysis: No results for input(s): COLORURINE, LABSPEC, PHURINE, GLUCOSEU, HGBUR, BILIRUBINUR, KETONESUR, PROTEINUR, UROBILINOGEN, NITRITE, LEUKOCYTESUR in the last 72 hours.  Invalid input(s): APPERANCEUR    Imaging: Mr Virgel Paling GE Contrast  Result Date: 01/25/2017 CLINICAL DATA:  Fever. Memory disturbance. Dialysis patient. Confusion and weakness. EXAM: MRI HEAD WITHOUT CONTRAST MRA HEAD WITHOUT CONTRAST TECHNIQUE: Multiplanar, multiecho pulse sequences of the brain and surrounding structures were obtained without intravenous contrast. Angiographic images of the head were obtained using MRA technique without contrast. COMPARISON:  CT 01/23/2017 FINDINGS: MRI HEAD FINDINGS Brain: Diffusion imaging shows a 1 cm acute infarction in the deep white matter adjacent to the posterior body of the left lateral ventricle. There are a few other smaller punctate acute infarctions in the left parietal/ frontoparietal region. Elsewhere, there are chronic small-vessel ischemic changes affecting the pons. There are a few old small vessel cerebellar infarctions. Cerebral hemispheres show mild chronic small-vessel ischemic change elsewhere. No cortical/large vessel territory infarction. No mass lesion, hemorrhage, hydrocephalus or extra-axial collection. Vascular: Major vessels at the base of the brain show flow. Skull and upper cervical spine: Negative Sinuses/Orbits: Sinuses are clear. Orbits are negative. Small mastoid effusions. Other: None MRA HEAD FINDINGS Both internal carotid arteries are patent through the skullbase and siphon regions. There is atherosclerotic irregularity in the carotid siphon regions. Maximal stenosis on the right is estimated at 50%. Maximal stenosis on the left is estimated at 30%. Supraclinoid internal carotid arteries are widely patent. Anterior and middle cerebral vessels are widely  patent. Distal vessels do not show pronounced irregularity. Both vertebral arteries are patent to the basilar with the right being dominant. There is atherosclerotic irregularity of the basilar artery with maximal stenosis of 30%. Superior cerebellar and posterior cerebral arteries are patent and appear normal. IMPRESSION: 1 cm acute infarction in the left hemispheric deep white matter adjacent to the posterior body of the left lateral ventricle. Few other punctate foci of acute infarction in the left frontoparietal brain. This raises the possibility of micro embolic infarctions. Mild chronic small-vessel ischemic change elsewhere affecting the brain. Atherosclerotic disease in both carotid siphon regions. 50% stenosis on the right. 30% stenosis on the left. 30% stenosis of the basilar artery. The intracranial branch vessels appear patent and normal. Electronically Signed   By: Nelson Chimes M.D.   On: 01/25/2017 12:50   Mr Brain Wo Contrast  Result Date: 01/25/2017 CLINICAL DATA:  Fever. Memory disturbance. Dialysis patient. Confusion and weakness.  EXAM: MRI HEAD WITHOUT CONTRAST MRA HEAD WITHOUT CONTRAST TECHNIQUE: Multiplanar, multiecho pulse sequences of the brain and surrounding structures were obtained without intravenous contrast. Angiographic images of the head were obtained using MRA technique without contrast. COMPARISON:  CT 01/23/2017 FINDINGS: MRI HEAD FINDINGS Brain: Diffusion imaging shows a 1 cm acute infarction in the deep white matter adjacent to the posterior body of the left lateral ventricle. There are a few other smaller punctate acute infarctions in the left parietal/ frontoparietal region. Elsewhere, there are chronic small-vessel ischemic changes affecting the pons. There are a few old small vessel cerebellar infarctions. Cerebral hemispheres show mild chronic small-vessel ischemic change elsewhere. No cortical/large vessel territory infarction. No mass lesion, hemorrhage, hydrocephalus  or extra-axial collection. Vascular: Major vessels at the base of the brain show flow. Skull and upper cervical spine: Negative Sinuses/Orbits: Sinuses are clear. Orbits are negative. Small mastoid effusions. Other: None MRA HEAD FINDINGS Both internal carotid arteries are patent through the skullbase and siphon regions. There is atherosclerotic irregularity in the carotid siphon regions. Maximal stenosis on the right is estimated at 50%. Maximal stenosis on the left is estimated at 30%. Supraclinoid internal carotid arteries are widely patent. Anterior and middle cerebral vessels are widely patent. Distal vessels do not show pronounced irregularity. Both vertebral arteries are patent to the basilar with the right being dominant. There is atherosclerotic irregularity of the basilar artery with maximal stenosis of 30%. Superior cerebellar and posterior cerebral arteries are patent and appear normal. IMPRESSION: 1 cm acute infarction in the left hemispheric deep white matter adjacent to the posterior body of the left lateral ventricle. Few other punctate foci of acute infarction in the left frontoparietal brain. This raises the possibility of micro embolic infarctions. Mild chronic small-vessel ischemic change elsewhere affecting the brain. Atherosclerotic disease in both carotid siphon regions. 50% stenosis on the right. 30% stenosis on the left. 30% stenosis of the basilar artery. The intracranial branch vessels appear patent and normal. Electronically Signed   By: Nelson Chimes M.D.   On: 01/25/2017 12:50   US Carotid Bilateral  Result Date: 01/24/2017 CLINICAL DATA:  Stroke. Hypertension, coronary artery disease, previous tobacco abuse. EXAM: BILATERAL CAROTID DUPLEX ULTRASOUND TECHNIQUE: Pearline Cables scale imaging, color Doppler and duplex ultrasound was performed of bilateral carotid and vertebral arteries in the neck. COMPARISON:  None. TECHNIQUE: Quantification of carotid stenosis is based on velocity parameters  that correlate the residual internal carotid diameter with NASCET-based stenosis levels, using the diameter of the distal internal carotid lumen as the denominator for stenosis measurement. The following velocity measurements were obtained: PEAK SYSTOLIC/END DIASTOLIC RIGHT ICA:                     145/17cm/sec CCA:                     16/10RU/EAV SYSTOLIC ICA/CCA RATIO:  1.5 DIASTOLIC ICA/CCA RATIO: 1.4 ECA:                     170cm/sec LEFT ICA:                     161/21cm/sec CCA:                     40/98JX/BJY SYSTOLIC ICA/CCA RATIO:  1.7 DIASTOLIC ICA/CCA RATIO: 1.9 ECA:                     106cm/sec FINDINGS: RIGHT  CAROTID ARTERY: Eccentric calcified plaque in the mid and distal common carotid artery without high-grade stenosis. Heavier irregular eccentric partially calcified plaque in the carotid bulb extending across the bifurcation into the proximal ICA resulting in at least mild stenosis. Mildly elevated peak systolic velocities just beyond the bifurcation. Elsewhere normal waveforms and color Doppler signal. RIGHT VERTEBRAL ARTERY:  Normal flow direction and waveform. LEFT CAROTID ARTERY: Scattered nonocclusive plaque in the common carotid artery. Eccentric partially calcified plaque at the carotid bifurcation involving proximal internal and external carotid arteries resulting in at least mild stenosis. The reported peak systolic velocities are probably overestimated due to poor angle correction. Normal waveforms and color Doppler signal. LEFT VERTEBRAL ARTERY: Normal flow direction and waveform. IMPRESSION: 1. 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 The exam does not exclude plaque ulceration or embolization. Continued surveillance recommended. 2.  Antegrade bilateral vertebral arterial flow. Electronically Signed   By: Lucrezia Europe M.D.   On: 01/24/2017 17:03     Medications:    . allopurinol  100 mg Oral Daily  . aspirin EC  81 mg Oral  Daily  . chlorpheniramine-HYDROcodone  5 mL Oral Q12H  . cinacalcet  90 mg Oral Daily  . cloNIDine  0.1 mg Oral BID  . colchicine  0.3 mg Oral Once per day on Mon Thu  . docusate sodium  100 mg Oral BID  . epoetin (EPOGEN/PROCRIT) injection  10,000 Units Intravenous Q T,Th,Sa-HD  . feeding supplement (ENSURE ENLIVE)  237 mL Oral BID BM  . fluticasone  2 spray Each Nare Daily  . furosemide  40 mg Oral Daily  . gabapentin  100 mg Oral QHS  . heparin  5,000 Units Subcutaneous Q8H  . hydrALAZINE  50 mg Oral BID  . insulin aspart  0-9 Units Subcutaneous TID WC  . isosorbide mononitrate  30 mg Oral BID  . metoprolol succinate  25 mg Oral QHS  . oseltamivir  30 mg Oral Once per day on Tue Thu Sat  . pantoprazole  40 mg Oral Daily  . pravastatin  40 mg Oral QHS  . sevelamer carbonate  2,400 mg Oral TID WC  . sodium chloride flush  3 mL Intravenous Q12H   sodium chloride, acetaminophen **OR** acetaminophen, albuterol, HYDROcodone-acetaminophen, ipratropium-albuterol, ondansetron **OR** ondansetron (ZOFRAN) IV, polyethylene glycol, sodium chloride flush  Assessment/ Plan:  [name] with ESRD on hemodialysis, hypertension, diabetes mellitus type II, diabetic retinopathy, hyperlipidemia, gout, glaucoma, GERD, coronary artery disease.  TTS [facility]   1. End Stage Renal Disease: TTS with permcath and maturing AVF.  Dialysis for later today. Orders prepared  2. Hypertension: blood pressure at goal.  - clonidine, furosemide, hydralazine, imdur, metoprolol  3. Anemia of chronic kidney disease:   - EPO with HD.   4. Secondary Hyperparathyroidism: with hyperphosphatemia - cinacalcet - sevelamer for binding.   5. Influenza: renally dosed oseltamivir    LOS: Nixa, Taylor 3/24/201811:07 AM

## 2017-01-26 NOTE — Progress Notes (Signed)
PT Cancellation Note  Patient Details Name: Laurie Steele MRN: [id number] DOB: 1941-09-22   Cancelled Treatment:    Reason Eval/Treat Not Completed: Patient at procedure or test/unavailable. Patient is currently at dialysis, reconsult placed for new CVA. Discussed with RN, patient is going to facility later today for short term rehabilitation. Will defer mobility session given the above. PT will continue to follow if patient does not end up discharging.    Royce Macadamia PT, DPT, CSCS    01/26/2017, 2:32 PM

## 2017-01-26 NOTE — Progress Notes (Signed)
DIALYSIS COMPLETE.D.

## 2017-01-26 NOTE — Progress Notes (Signed)
*  PRELIMINARY RESULTS* Echocardiogram 2D Echocardiogram has been performed.  Laurie Steele 01/26/2017, 11:55 AM

## 2017-01-26 NOTE — Progress Notes (Signed)
Spoke with daughter Melody, informing her of pt d/c to [facility], and asked about pt family preference for transportation (previous note states family wishes to transport pt, not EMS). Awaiting call back from family re decision.

## 2017-01-26 NOTE — Progress Notes (Signed)
PRE DIALYSIS ASSESSMENT 

## 2017-01-26 NOTE — Clinical Social Work Note (Signed)
Patient to dc to [facility] today. Transportation is via family; however, if the family finds it will not be safe to transport, EMS documentation is included in the packet already delivered to the unit. Facility is aware. CSW will con't to follow pending additional dc needs.  Santiago Bumpers, MSW, Latanya Presser [phone]

## 2017-01-27 LAB — CULTURE, BLOOD (ROUTINE X 2)
Culture: NO GROWTH
Culture: NO GROWTH

## 2017-01-27 LAB — ECHOCARDIOGRAM COMPLETE
HEIGHTINCHES: 67 in
Weight: 2736 oz

## 2017-01-27 LAB — HEPATITIS B SURFACE ANTIGEN: Hepatitis B Surface Ag: NEGATIVE

## 2017-02-22 ENCOUNTER — Encounter (INDEPENDENT_AMBULATORY_CARE_PROVIDER_SITE_OTHER): Payer: Self-pay

## 2017-02-25 ENCOUNTER — Encounter
Admission: RE | Disposition: A | Discharge status: Home / Self Care | Payer: Self-pay | Source: Ambulatory Visit | Attending: Vascular Surgery

## 2017-02-25 ENCOUNTER — Other Ambulatory Visit (INDEPENDENT_AMBULATORY_CARE_PROVIDER_SITE_OTHER): Payer: Self-pay | Admitting: Vascular Surgery

## 2017-02-25 ENCOUNTER — Ambulatory Visit
Admission: RE | Admit: 2017-02-25 | Discharge: 2017-02-25 | Disposition: A | Discharge status: Home / Self Care | Payer: Medicare Other | Source: Ambulatory Visit | Attending: Vascular Surgery | Admitting: Vascular Surgery

## 2017-02-25 DIAGNOSIS — D631 Anemia in chronic kidney disease: Secondary | ICD-10-CM | POA: Insufficient documentation

## 2017-02-25 DIAGNOSIS — I120 Hypertensive chronic kidney disease with stage 5 chronic kidney disease or end stage renal disease: Secondary | ICD-10-CM | POA: Insufficient documentation

## 2017-02-25 DIAGNOSIS — Z90710 Acquired absence of both cervix and uterus: Secondary | ICD-10-CM | POA: Insufficient documentation

## 2017-02-25 DIAGNOSIS — K219 Gastro-esophageal reflux disease without esophagitis: Secondary | ICD-10-CM | POA: Diagnosis not present

## 2017-02-25 DIAGNOSIS — Z9889 Other specified postprocedural states: Secondary | ICD-10-CM | POA: Insufficient documentation

## 2017-02-25 DIAGNOSIS — I2510 Atherosclerotic heart disease of native coronary artery without angina pectoris: Secondary | ICD-10-CM | POA: Insufficient documentation

## 2017-02-25 DIAGNOSIS — E785 Hyperlipidemia, unspecified: Secondary | ICD-10-CM | POA: Diagnosis not present

## 2017-02-25 DIAGNOSIS — Z86010 Personal history of colonic polyps: Secondary | ICD-10-CM | POA: Diagnosis not present

## 2017-02-25 DIAGNOSIS — Z452 Encounter for adjustment and management of vascular access device: Secondary | ICD-10-CM | POA: Diagnosis present

## 2017-02-25 DIAGNOSIS — Z9049 Acquired absence of other specified parts of digestive tract: Secondary | ICD-10-CM | POA: Diagnosis not present

## 2017-02-25 DIAGNOSIS — Z7982 Long term (current) use of aspirin: Secondary | ICD-10-CM | POA: Insufficient documentation

## 2017-02-25 DIAGNOSIS — Z87891 Personal history of nicotine dependence: Secondary | ICD-10-CM | POA: Insufficient documentation

## 2017-02-25 DIAGNOSIS — Z8249 Family history of ischemic heart disease and other diseases of the circulatory system: Secondary | ICD-10-CM | POA: Insufficient documentation

## 2017-02-25 DIAGNOSIS — Z794 Long term (current) use of insulin: Secondary | ICD-10-CM | POA: Insufficient documentation

## 2017-02-25 DIAGNOSIS — Z841 Family history of disorders of kidney and ureter: Secondary | ICD-10-CM | POA: Diagnosis not present

## 2017-02-25 DIAGNOSIS — E11319 Type 2 diabetes mellitus with unspecified diabetic retinopathy without macular edema: Secondary | ICD-10-CM | POA: Insufficient documentation

## 2017-02-25 DIAGNOSIS — H409 Unspecified glaucoma: Secondary | ICD-10-CM | POA: Diagnosis not present

## 2017-02-25 DIAGNOSIS — E1140 Type 2 diabetes mellitus with diabetic neuropathy, unspecified: Secondary | ICD-10-CM | POA: Diagnosis not present

## 2017-02-25 DIAGNOSIS — M109 Gout, unspecified: Secondary | ICD-10-CM | POA: Diagnosis not present

## 2017-02-25 DIAGNOSIS — Z992 Dependence on renal dialysis: Secondary | ICD-10-CM | POA: Insufficient documentation

## 2017-02-25 DIAGNOSIS — E1122 Type 2 diabetes mellitus with diabetic chronic kidney disease: Secondary | ICD-10-CM | POA: Insufficient documentation

## 2017-02-25 DIAGNOSIS — Z888 Allergy status to other drugs, medicaments and biological substances status: Secondary | ICD-10-CM | POA: Insufficient documentation

## 2017-02-25 DIAGNOSIS — N186 End stage renal disease: Secondary | ICD-10-CM | POA: Diagnosis not present

## 2017-02-25 HISTORY — PX: DIALYSIS/PERMA CATHETER REMOVAL: CATH118289

## 2017-02-25 SURGERY — DIALYSIS/PERMA CATHETER REMOVAL
Anesthesia: Moderate Sedation

## 2017-02-25 MED ORDER — LIDOCAINE HCL (PF) 1 % IJ SOLN
INTRAMUSCULAR | Status: DC | PRN
  Administered 2017-02-25: 10 mL via INTRADERMAL

## 2017-02-25 SURGICAL SUPPLY — 4 items
GLOVE SURG SYN 7.0 (GLOVE) ×3 IMPLANT
GLOVE SURG SYN 7.5  E (GLOVE) ×2
GLOVE SURG SYN 7.5 E (GLOVE) ×1 IMPLANT
TRAY LACERAT/PLASTIC (MISCELLANEOUS) ×3 IMPLANT

## 2017-02-25 NOTE — H&P (Signed)
[facility] History & Physical Update  The patient was interviewed and re-examined.  The patient's previous History and Physical has been reviewed and is unchanged.  There is no change in the plan of care. We plan to proceed with the scheduled procedure.  Leotis Pain, MD  02/25/2017, 1:48 PM

## 2017-02-25 NOTE — Op Note (Signed)
Operative Note     Preoperative diagnosis:   1. ESRD with functional permanent access  Postoperative diagnosis:  1. ESRD with functional permanent access  Procedure:  Removal of Right jugular Permcath  Surgeon:  Leotis Pain, MD  Anesthesia:  Local  EBL:  Minimal  Indication for the Procedure:  The patient has a functional permanent dialysis access and no longer needs their permcath.  This can be removed.  Risks and benefits are discussed and informed consent is obtained.  Description of the Procedure:  The patient's right neck, chest and existing catheter were sterilely prepped and draped. The area around the catheter was anesthetized copiously with 1% lidocaine. The catheter was dissected out with curved hemostats until the cuff was freed from the surrounding fibrous sheath. The fiber sheath was transected, and the catheter was then removed in its entirety using gentle traction. Pressure was held and sterile dressings were placed. The patient tolerated the procedure well and was taken to the recovery room in stable condition.     Leotis Pain  02/25/2017, 3:13 PM This note was created with Dragon Medical transcription system. Any errors in dictation are purely unintentional.

## 2017-02-25 NOTE — Discharge Instructions (Signed)
Remove dressing from right upper chest in two days. Do not soak in bathtub or hot tub until former perm cath site is healed, approximately one week. If you develop signs of infection, pus discharge or fever please call primary care provider

## 2017-02-26 ENCOUNTER — Encounter: Payer: Self-pay | Admitting: Vascular Surgery

## 2017-03-10 ENCOUNTER — Emergency Department
Admission: EM | Admit: 2017-03-10 | Discharge: 2017-03-10 | Disposition: A | Discharge status: Home / Self Care | Payer: Medicare Other | Attending: Emergency Medicine | Admitting: Emergency Medicine

## 2017-03-10 ENCOUNTER — Encounter: Payer: Self-pay | Admitting: Emergency Medicine

## 2017-03-10 ENCOUNTER — Emergency Department: Payer: Medicare Other

## 2017-03-10 DIAGNOSIS — N186 End stage renal disease: Secondary | ICD-10-CM | POA: Insufficient documentation

## 2017-03-10 DIAGNOSIS — Z794 Long term (current) use of insulin: Secondary | ICD-10-CM | POA: Diagnosis not present

## 2017-03-10 DIAGNOSIS — R059 Cough, unspecified: Secondary | ICD-10-CM

## 2017-03-10 DIAGNOSIS — E119 Type 2 diabetes mellitus without complications: Secondary | ICD-10-CM | POA: Diagnosis not present

## 2017-03-10 DIAGNOSIS — I2510 Atherosclerotic heart disease of native coronary artery without angina pectoris: Secondary | ICD-10-CM | POA: Insufficient documentation

## 2017-03-10 DIAGNOSIS — Z992 Dependence on renal dialysis: Secondary | ICD-10-CM | POA: Insufficient documentation

## 2017-03-10 DIAGNOSIS — I509 Heart failure, unspecified: Secondary | ICD-10-CM | POA: Diagnosis not present

## 2017-03-10 DIAGNOSIS — R05 Cough: Secondary | ICD-10-CM | POA: Diagnosis not present

## 2017-03-10 DIAGNOSIS — I132 Hypertensive heart and chronic kidney disease with heart failure and with stage 5 chronic kidney disease, or end stage renal disease: Secondary | ICD-10-CM | POA: Insufficient documentation

## 2017-03-10 DIAGNOSIS — Z7982 Long term (current) use of aspirin: Secondary | ICD-10-CM | POA: Diagnosis not present

## 2017-03-10 DIAGNOSIS — Z79899 Other long term (current) drug therapy: Secondary | ICD-10-CM | POA: Insufficient documentation

## 2017-03-10 DIAGNOSIS — Z87891 Personal history of nicotine dependence: Secondary | ICD-10-CM | POA: Insufficient documentation

## 2017-03-10 LAB — CBC WITH DIFFERENTIAL/PLATELET
BASOS ABS: 0.1 10*3/uL (ref 0–0.1)
Basophils Relative: 1 %
EOS PCT: 6 %
Eosinophils Absolute: 0.4 10*3/uL (ref 0–0.7)
HEMATOCRIT: 33 % — AB (ref 35.0–47.0)
Hemoglobin: 10.6 g/dL — ABNORMAL LOW (ref 12.0–16.0)
LYMPHS ABS: 1.8 10*3/uL (ref 1.0–3.6)
LYMPHS PCT: 24 %
MCH: 33.4 pg (ref 26.0–34.0)
MCHC: 32 g/dL (ref 32.0–36.0)
MCV: 104.1 fL — AB (ref 80.0–100.0)
MONO ABS: 0.6 10*3/uL (ref 0.2–0.9)
Monocytes Relative: 8 %
NEUTROS ABS: 4.6 10*3/uL (ref 1.4–6.5)
Neutrophils Relative %: 61 %
Platelets: 205 10*3/uL (ref 150–440)
RBC: 3.16 MIL/uL — ABNORMAL LOW (ref 3.80–5.20)
RDW: 18.1 % — AB (ref 11.5–14.5)
WBC: 7.5 10*3/uL (ref 3.6–11.0)

## 2017-03-10 LAB — COMPREHENSIVE METABOLIC PANEL
ALT: 18 U/L (ref 14–54)
AST: 23 U/L (ref 15–41)
Albumin: 3.6 g/dL (ref 3.5–5.0)
Alkaline Phosphatase: 83 U/L (ref 38–126)
Anion gap: 14 (ref 5–15)
BILIRUBIN TOTAL: 0.6 mg/dL (ref 0.3–1.2)
BUN: 41 mg/dL — AB (ref 6–20)
CALCIUM: 10.8 mg/dL — AB (ref 8.9–10.3)
CO2: 26 mmol/L (ref 22–32)
CREATININE: 6.82 mg/dL — AB (ref 0.44–1.00)
Chloride: 100 mmol/L — ABNORMAL LOW (ref 101–111)
GFR, EST AFRICAN AMERICAN: 6 mL/min — AB (ref >60)
GFR, EST NON AFRICAN AMERICAN: 5 mL/min — AB (ref >60)
Glucose, Bld: 193 mg/dL — ABNORMAL HIGH (ref 65–99)
POTASSIUM: 3.4 mmol/L — AB (ref 3.5–5.1)
Sodium: 140 mmol/L (ref 135–145)
Total Protein: 7.4 g/dL (ref 6.5–8.1)

## 2017-03-10 LAB — TROPONIN I: Troponin I: 0.07 ng/mL (ref <0.03)

## 2017-03-10 MED ORDER — AZITHROMYCIN 250 MG PO TABS: ORAL_TABLET | ORAL | 0 refills | Status: AC

## 2017-03-10 NOTE — ED Triage Notes (Signed)
C/O cough x 1 week.  Cough is productive for clear phlegm.  Denies fever.  Occasionally feels SOB.

## 2017-03-10 NOTE — ED Provider Notes (Signed)
[facility] Emergency Department Provider Note  ____________________________________________   First MD Initiated Contact with Patient 03/10/17 1457     (approximate)  I have reviewed the triage vital signs and the nursing notes.   HISTORY  Chief Complaint Cough    HPI Laurie Steele is a 76 y.o. female who self presents to the emergency department with 1 week of cough productive of small amount of clear phlegm. She denies fevers or chills. She does report some mild sharp diffuse anterior chest pain worse with coughing and improved when not coughing. She denies abdominal pain nausea or vomiting. She does have a history of end-stage renal disease and was most recently dialyzed yesterday. She's never had a heart attack. She does not have a history of COPD.   Past Medical History:  Diagnosis Date  . CAD (coronary artery disease)   . Chronic anemia    Dr Inez Pilgrim  . GERD (gastroesophageal reflux disease)   . Glaucoma   . Gout   . Hx of colonic polyps   . Hyperlipidemia   . Hypertension   . Lower back pain   . Miscarriage   . 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 Peritoneal dialysis status ([place])   . Renal failure    Dr Holley Raring  . 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 Vaginal delivery    x 4    Patient Active Problem List   Diagnosis Date Noted  . UTI (urinary tract infection) 01/22/2017  . CHF (congestive heart failure) ([place]) 12/11/2016  . Ds DNA antibody positive 07/09/2016  . Pneumonia 07/03/2016  . Anemia 06/17/2016  . Symptomatic anemia 06/15/2016  . Hypoglycemia 05/21/2016  . Chest pain 04/05/2016  . Hypercalcemia 03/14/2016  . Bronchitis 03/03/2012  . Type II or unspecified type diabetes mellitus with renal manifestations, not stated as uncontrolled(250.40) 12/18/2011  . END STAGE RENAL DISEASE  10/23/2010  . DM (diabetes mellitus), type 2 ([place]) 12/13/2009  . NEUROPATHY 12/13/2009  . GOUT 02/15/2009  . SLEEP DISORDER 02/15/2009  . GERD 02/23/2008  . Hyperlipidemia 05/14/2007  . ANEMIA, CHRONIC 05/14/2007  . Essential hypertension 05/14/2007    Past Surgical History:  Procedure Laterality Date  . ABDOMINAL HYSTERECTOMY  1983  . AV FISTULA PLACEMENT Left 09/21/2016   Procedure: ARTERIOVENOUS (AV) FISTULA CREATION ( BRACHIOCEPHALIC );  Surgeon: Katha Cabal, MD;  Location: [facility] ORS;  Service: Vascular;  Laterality: Left;  . BACK SURGERY  6/08   Dr Collier Salina  . BACK SURGERY  1987   disc  . CHOLECYSTECTOMY    . DIALYSIS/PERMA CATHETER REMOVAL N/A 02/25/2017   Procedure: Dialysis/Perma Catheter Removal;  Surgeon: Algernon Huxley, MD;  Location: [place] [facility];  Service: Cardiovascular;  Laterality: N/A;  . INSERTION OF DIALYSIS CATHETER  2017  . PERITONEAL CATHETER INSERTION    . REMOVAL OF A DIALYSIS CATHETER N/A 09/21/2016   Procedure: REMOVAL OF A DIALYSIS CATHETER ( PERITONEAL DIALYSIS CATH );  Surgeon: Katha Cabal, MD;  Location: [facility] ORS;  Service: Vascular;  Laterality: N/A;    Prior to Admission medications   Medication Sig Start Date End Date Taking? Authorizing Provider  acetaminophen (TYLENOL) 650 MG CR tablet Take 650 mg by mouth every 8 (eight) hours as needed for pain.    [provider]  allopurinol (ZYLOPRIM) 100  MG tablet Take 200 tablets by mouth at bedtime.     [provider]  amitriptyline (ELAVIL) 25 MG tablet Take 1 tablet by mouth at bedtime as needed (For foot pain).  01/02/16   [provider]  amLODipine (NORVASC) 10 MG tablet Take 10 mg by mouth daily. 11/03/16   [provider]  aspirin EC 81 MG tablet Take 1 tablet (81 mg total) by mouth daily. Patient taking differently: Take 325 mg by mouth daily.  06/18/16   Fritzi Mandes, MD  azithromycin (ZITHROMAX Z-PAK) 250 MG tablet Take 2 tablets (500 mg) on   Day 1,  followed by 1 tablet (250 mg) once daily on Days 2 through 5. 03/10/17 03/15/17  Darel Hong, MD  cinacalcet (SENSIPAR) 90 MG tablet Take 90 mg by mouth daily. With lunch    [provider]  cloNIDine (CATAPRES) 0.1 MG tablet Take 0.1 mg by mouth See admin instructions. tk 1 tab po bid on sun, mon, wed, and fri, then tk 1 tab qhs on tues, thurs, and sat 11/08/16   [provider]  clopidogrel (PLAVIX) 75 MG tablet Take 1 tablet (75 mg total) by mouth daily. 01/26/17   Nicholes Mango, MD  colchicine 0.6 MG tablet Take 0.5 tablets (0.3 mg total) by mouth 2 (two) times a week. Patient taking differently: Take 0.6 mg by mouth 2 (two) times daily.  07/09/16   Demetrios Loll, MD  cyclobenzaprine (FLEXERIL) 5 MG tablet Take 5 mg by mouth as needed.     [provider]  diclofenac sodium (VOLTAREN) 1 % GEL Apply 2 g four times a day PRN Not a candidate for oral NSAIDS, ESRD on PD 06/27/16   [provider]  docusate sodium (COLACE) 100 MG capsule Take 1 capsule (100 mg total) by mouth daily as needed for mild constipation. 12/13/16   Loletha Grayer, MD  famotidine (PEPCID) 20 MG tablet Take 20 mg by mouth at bedtime.  11/29/16   [provider]  feeding supplement, ENSURE ENLIVE, (ENSURE ENLIVE) LIQD Take 237 mLs by mouth 2 (two) times daily between meals. 01/25/17   Max Sane, MD  ferrous sulfate 325 (65 FE) MG tablet Take 1 tablet (325 mg total) by mouth daily with breakfast. Patient taking differently: Take 325 mg by mouth at bedtime.  06/18/16   Fritzi Mandes, MD  fluticasone (FLONASE) 50 MCG/ACT nasal spray Place 2 sprays into both nostrils daily.     [provider]  furosemide (LASIX) 40 MG tablet Take 1 tablet (40 mg total) by mouth daily. Patient taking differently: Take 40 mg by mouth See admin instructions. Tk 1 tab qd on Sunday, Monday, Wednesday, and Friday 07/05/16   Demetrios Loll, MD  gabapentin (NEURONTIN) 100 MG capsule Take 1 capsule (100 mg  total) by mouth at bedtime. Patient taking differently: Take 100 mg by mouth 2 (two) times daily.  07/05/16   Demetrios Loll, MD  hydrALAZINE (APRESOLINE) 50 MG tablet Take 50 mg by mouth See admin instructions. tk 1 tab tid on sun, mon, wed, and fri, then tk 1 tab qhs on tues, thurs and sat 11/29/16   [provider]  insulin aspart (NOVOLOG) 100 UNIT/ML injection Inject 0-5 Units into the skin at bedtime. Patient taking differently: Inject 0-5 Units into the skin at bedtime. If blood sugar is 70-120, no insulin. If 121-250 take 2 units, 251-300 take 3 units, 301-350 take 4 units, 351-400 take 5 units. If over 400 call physician. 05/28/16  Fritzi Mandes, MD  ipratropium (ATROVENT) 0.03 % nasal spray Place 1 spray into both nostrils 3 (three) times daily as needed for rhinitis.    [provider]  ipratropium-albuterol (DUONEB) 0.5-2.5 (3) MG/3ML SOLN Take 3 mLs by nebulization every 6 (six) hours as needed. 12/14/16   Gladstone Lighter, MD  isosorbide mononitrate (IMDUR) 30 MG 24 hr tablet Take 30 mg by mouth 2 (two) times daily. 11/03/16   [provider]  latanoprost (XALATAN) 0.005 % ophthalmic solution Place 1 drop into both eyes at bedtime. 03/20/16   [provider]  metoprolol succinate (TOPROL-XL) 25 MG 24 hr tablet Take 1 tablet (25 mg total) by mouth at bedtime. 12/13/16   Loletha Grayer, MD  multivitamin (RENA-VIT) TABS tablet Take 1 tablet by mouth daily.    [provider]  pantoprazole (PROTONIX) 40 MG tablet Take 1 tablet (40 mg total) by mouth daily. 06/18/16   Fritzi Mandes, MD  pravastatin (PRAVACHOL) 40 MG tablet Take 40 mg by mouth at bedtime.  06/21/15   [provider]  sevelamer (RENVELA) 800 MG tablet Take 800 mg by mouth 3 (three) times daily with meals.     [provider]    Allergies Alprazolam  Family History  Problem Relation Age of Onset  . Heart attack Father   . Hypertension Mother   . Hyperlipidemia Mother   .  Coronary artery disease      strong fam hx  . Kidney failure Brother   . Breast cancer Neg Hx     Social History Social History  Substance Use Topics  . Smoking status: Former Smoker    Quit date: 11/05/1990  . Smokeless tobacco: Never Used  . Alcohol use No    Review of Systems Constitutional: No fever/chills Eyes: No visual changes. ENT: No sore throat. Cardiovascular: Positive chest pain. Respiratory: Positive shortness of breath. Gastrointestinal: No abdominal pain.  No nausea, no vomiting.  No diarrhea.  No constipation. Genitourinary: Negative for dysuria. Musculoskeletal: Negative for back pain. Skin: Negative for rash. Neurological: Negative for headaches, focal weakness or numbness.  10-point ROS otherwise negative.  ____________________________________________   PHYSICAL EXAM:  VITAL SIGNS: ED Triage Vitals  Enc Vitals Group     BP 03/10/17 1247 (!) 156/58     Pulse Rate 03/10/17 1247 79     Resp 03/10/17 1247 18     Temp 03/10/17 1247 98.7 F (37.1 C)     Temp Source 03/10/17 1247 Oral     SpO2 03/10/17 1247 99 %     Weight 03/10/17 1249 180 lb (81.6 kg)     Height 03/10/17 1249 5\' 7"  (1.702 m)     Head Circumference --      Peak Flow --      Pain Score --      Pain Loc --      Pain Edu? --      Excl. in [place]? --     Constitutional: Alert and oriented x 4 well appearing nontoxic no diaphoresis speaks in full, clear sentences Eyes: PERRL EOMI. Head: Atraumatic. Nose: No congestion/rhinnorhea. Mouth/Throat: No trismus Neck: No stridor.   Cardiovascular: Normal rate, regular rhythm. Grossly normal heart sounds.  Good peripheral circulation. Respiratory: Normal respiratory effort.  No retractions. Lungs CTAB and moving good air Gastrointestinal: Soft nontender Musculoskeletal: No lower extremity edema   Neurologic:  Normal speech and language. No gross focal neurologic deficits are appreciated. Skin:  Skin is warm, dry and intact. No rash  noted. Psychiatric: Mood and affect are normal. Speech and behavior are normal.    ____________________________________________    ____________________________________________   LABS (all labs ordered are listed, but only abnormal results are displayed)  Labs Reviewed  CBC WITH DIFFERENTIAL/PLATELET - Abnormal; Notable for the following:       Result Value   RBC 3.16 (*)    Hemoglobin 10.6 (*)    HCT 33.0 (*)    MCV 104.1 (*)    RDW 18.1 (*)    All other components within normal limits  COMPREHENSIVE METABOLIC PANEL - Abnormal; Notable for the following:    Potassium 3.4 (*)    Chloride 100 (*)    Glucose, Bld 193 (*)    BUN 41 (*)    Creatinine, Ser 6.82 (*)    Calcium 10.8 (*)    GFR calc non Af Amer 5 (*)    GFR calc Af Amer 6 (*)    All other components within normal limits  TROPONIN I - Abnormal; Notable for the following:    Troponin I 0.07 (*)    All other components within normal limits    Elevated troponin but in the setting of end-stage renal disease of unclear significance __________________________________________  EKG  ED ECG REPORT I, Darel Hong, the attending physician, personally viewed and interpreted this ECG.  Date: 03/11/2017 Rate: 79 Rhythm: normal sinus rhythm QRS Axis: normal Intervals: normal ST/T Wave abnormalities: Nonspecific ST changes laterally Conduction Disturbances: none Narrative Interpretation: Borderline  ____________________________________________  RADIOLOGY  Chest x-ray with no acute disease ____________________________________________   PROCEDURES  Procedure(s) performed: no  Procedures  Critical Care performed: no  Observation: no ____________________________________________   INITIAL IMPRESSION / ASSESSMENT AND PLAN / ED COURSE  Pertinent labs & imaging results that were available during my care of the patient were reviewed by me and considered in my medical decision making (see chart for  details).  The patient arrives well-appearing with clear lungs and a normal chest x-ray. I appreciate her elevated troponin but in the setting of end-stage renal disease and a nonischemic story this is of unclear significance. Her cough has been persistent for a week and it is productive and while this most likely represents a viral infection given the duration and productivity and think it is reasonable to treat her with a short course of azithromycin for possible early community-acquired pneumonia. She agrees with the plan and is stable for outpatient management.      ____________________________________________   FINAL CLINICAL IMPRESSION(S) / ED DIAGNOSES  Final diagnoses:  Cough      NEW MEDICATIONS STARTED DURING THIS VISIT:  Discharge Medication List as of 03/10/2017  3:07 PM    START taking these medications   Details  azithromycin (ZITHROMAX Z-PAK) 250 MG tablet Take 2 tablets (500 mg) on  Day 1,  followed by 1 tablet (250 mg) once daily on Days 2 through 5., Print         Note:  This document was prepared using Dragon voice recognition software and may include unintentional dictation errors.     Darel Hong, MD 03/11/17 2150

## 2017-03-10 NOTE — Discharge Instructions (Signed)
Please take all of your antibiotics as prescribed and make sure you keep your appointment for dialysis tomorrow as scheduled. If you do not start to feel better in the next 2 days' please make an appointment to follow-up with her primary care physician. Return to the emergency department sooner for any new or worsening symptoms such as fevers, chills, shortness of breath, or for any other concerns.  It was a pleasure to take care of you today, and thank you for coming to our emergency department.  If you have any questions or concerns before leaving please ask the nurse to grab me and I'm more than happy to go through your aftercare instructions again.  If you were prescribed any opioid pain medication today such as Norco, Vicodin, Percocet, morphine, hydrocodone, or oxycodone please make sure you do not drive when you are taking this medication as it can alter your ability to drive safely.  If you have any concerns once you are home that you are not improving or are in fact getting worse before you can make it to your follow-up appointment, please do not hesitate to call [phone] and come back for further evaluation.  Darel Hong MD  Results for orders placed or performed during the hospital encounter of 03/10/17  CBC with Differential  Result Value Ref Range   WBC 7.5 3.6 - 11.0 K/uL   RBC 3.16 (L) 3.80 - 5.20 MIL/uL   Hemoglobin 10.6 (L) 12.0 - 16.0 g/dL   HCT 33.0 (L) 35.0 - 47.0 %   MCV 104.1 (H) 80.0 - 100.0 fL   MCH 33.4 26.0 - 34.0 pg   MCHC 32.0 32.0 - 36.0 g/dL   RDW 18.1 (H) 11.5 - 14.5 %   Platelets 205 150 - 440 K/uL   Neutrophils Relative % 61 %   Neutro Abs 4.6 1.4 - 6.5 K/uL   Lymphocytes Relative 24 %   Lymphs Abs 1.8 1.0 - 3.6 K/uL   Monocytes Relative 8 %   Monocytes Absolute 0.6 0.2 - 0.9 K/uL   Eosinophils Relative 6 %   Eosinophils Absolute 0.4 0 - 0.7 K/uL   Basophils Relative 1 %   Basophils Absolute 0.1 0 - 0.1 K/uL  Comprehensive metabolic panel  Result Value  Ref Range   Sodium 140 135 - 145 mmol/L   Potassium 3.4 (L) 3.5 - 5.1 mmol/L   Chloride 100 (L) 101 - 111 mmol/L   CO2 26 22 - 32 mmol/L   Glucose, Bld 193 (H) 65 - 99 mg/dL   BUN 41 (H) 6 - 20 mg/dL   Creatinine, Ser 6.82 (H) 0.44 - 1.00 mg/dL   Calcium 10.8 (H) 8.9 - 10.3 mg/dL   Total Protein 7.4 6.5 - 8.1 g/dL   Albumin 3.6 3.5 - 5.0 g/dL   AST 23 15 - 41 U/L   ALT 18 14 - 54 U/L   Alkaline Phosphatase 83 38 - 126 U/L   Total Bilirubin 0.6 0.3 - 1.2 mg/dL   GFR calc non Af Amer 5 (L) >60 mL/min   GFR calc Af Amer 6 (L) >60 mL/min   Anion gap 14 5 - 15  Troponin I  Result Value Ref Range   Troponin I 0.07 (HH) <0.03 ng/mL   Dg Chest 2 View  Result Date: 03/10/2017 CLINICAL DATA:  Cough for 1 week. No fever. Former smoker with history of coronary artery disease, hypertension and renal failure. EXAM: CHEST  2 VIEW COMPARISON:  01/22/2017 and 12/11/2016 radiographs.  FINDINGS: The hemodialysis catheter has been removed. There is stable cardiomegaly and aortic atherosclerosis. There is chronic vascular congestion and asymmetric prominence of the right hilum, unchanged from 2016, attributed to vascular structures. No overt pulmonary edema, confluent airspace opacity, pleural effusion or pneumothorax seen. The bones appear unchanged. IMPRESSION: Stable cardiomegaly and chronic vascular congestion. No acute findings demonstrated. Electronically Signed   By: Richardean Sale M.D.   On: 03/10/2017 13:30

## 2017-03-10 NOTE — ED Notes (Signed)
Date and time results received: 03/10/17 1330   Test: troponin Critical Value: 0.07  Name of Provider Notified: [place]  Orders Received? Or Actions Taken?: MD notified.

## 2017-04-04 ENCOUNTER — Emergency Department
Admission: EM | Admit: 2017-04-04 | Discharge: 2017-04-04 | Disposition: A | Discharge status: Home / Self Care | Payer: Medicare Other | Attending: Emergency Medicine | Admitting: Emergency Medicine

## 2017-04-04 ENCOUNTER — Emergency Department: Payer: Medicare Other

## 2017-04-04 ENCOUNTER — Encounter: Payer: Self-pay | Admitting: *Deleted

## 2017-04-04 DIAGNOSIS — R197 Diarrhea, unspecified: Secondary | ICD-10-CM | POA: Insufficient documentation

## 2017-04-04 DIAGNOSIS — I509 Heart failure, unspecified: Secondary | ICD-10-CM | POA: Insufficient documentation

## 2017-04-04 DIAGNOSIS — R112 Nausea with vomiting, unspecified: Secondary | ICD-10-CM | POA: Diagnosis present

## 2017-04-04 DIAGNOSIS — N186 End stage renal disease: Secondary | ICD-10-CM | POA: Diagnosis not present

## 2017-04-04 DIAGNOSIS — Z794 Long term (current) use of insulin: Secondary | ICD-10-CM | POA: Insufficient documentation

## 2017-04-04 DIAGNOSIS — Z9049 Acquired absence of other specified parts of digestive tract: Secondary | ICD-10-CM | POA: Insufficient documentation

## 2017-04-04 DIAGNOSIS — Z992 Dependence on renal dialysis: Secondary | ICD-10-CM | POA: Insufficient documentation

## 2017-04-04 DIAGNOSIS — R109 Unspecified abdominal pain: Secondary | ICD-10-CM | POA: Insufficient documentation

## 2017-04-04 DIAGNOSIS — Z7982 Long term (current) use of aspirin: Secondary | ICD-10-CM | POA: Insufficient documentation

## 2017-04-04 DIAGNOSIS — Z87891 Personal history of nicotine dependence: Secondary | ICD-10-CM | POA: Diagnosis not present

## 2017-04-04 DIAGNOSIS — I132 Hypertensive heart and chronic kidney disease with heart failure and with stage 5 chronic kidney disease, or end stage renal disease: Secondary | ICD-10-CM | POA: Diagnosis not present

## 2017-04-04 DIAGNOSIS — I2510 Atherosclerotic heart disease of native coronary artery without angina pectoris: Secondary | ICD-10-CM | POA: Diagnosis not present

## 2017-04-04 DIAGNOSIS — E1122 Type 2 diabetes mellitus with diabetic chronic kidney disease: Secondary | ICD-10-CM | POA: Insufficient documentation

## 2017-04-04 DIAGNOSIS — Z7902 Long term (current) use of antithrombotics/antiplatelets: Secondary | ICD-10-CM | POA: Insufficient documentation

## 2017-04-04 LAB — BASIC METABOLIC PANEL
Anion gap: 12 (ref 5–15)
BUN: 27 mg/dL — AB (ref 6–20)
CALCIUM: 8.6 mg/dL — AB (ref 8.9–10.3)
CO2: 26 mmol/L (ref 22–32)
Chloride: 101 mmol/L (ref 101–111)
Creatinine, Ser: 3.96 mg/dL — ABNORMAL HIGH (ref 0.44–1.00)
GFR calc Af Amer: 12 mL/min — ABNORMAL LOW (ref >60)
GFR, EST NON AFRICAN AMERICAN: 10 mL/min — AB (ref >60)
GLUCOSE: 225 mg/dL — AB (ref 65–99)
Potassium: 3.3 mmol/L — ABNORMAL LOW (ref 3.5–5.1)
Sodium: 139 mmol/L (ref 135–145)

## 2017-04-04 LAB — CBC WITH DIFFERENTIAL/PLATELET
BASOS ABS: 0.1 10*3/uL (ref 0–0.1)
BASOS PCT: 1 %
EOS ABS: 0.1 10*3/uL (ref 0–0.7)
Eosinophils Relative: 1 %
HEMATOCRIT: 30.4 % — AB (ref 35.0–47.0)
Hemoglobin: 9.9 g/dL — ABNORMAL LOW (ref 12.0–16.0)
Lymphocytes Relative: 10 %
Lymphs Abs: 1 10*3/uL (ref 1.0–3.6)
MCH: 34.5 pg — ABNORMAL HIGH (ref 26.0–34.0)
MCHC: 32.4 g/dL (ref 32.0–36.0)
MCV: 106.3 fL — ABNORMAL HIGH (ref 80.0–100.0)
MONO ABS: 0.5 10*3/uL (ref 0.2–0.9)
Monocytes Relative: 5 %
NEUTROS ABS: 8.5 10*3/uL — AB (ref 1.4–6.5)
NEUTROS PCT: 83 %
Platelets: 176 10*3/uL (ref 150–440)
RBC: 2.86 MIL/uL — ABNORMAL LOW (ref 3.80–5.20)
RDW: 18.5 % — AB (ref 11.5–14.5)
WBC: 10.2 10*3/uL (ref 3.6–11.0)

## 2017-04-04 LAB — HEPATIC FUNCTION PANEL
ALT: 42 U/L (ref 14–54)
AST: 48 U/L — ABNORMAL HIGH (ref 15–41)
Albumin: 3.4 g/dL — ABNORMAL LOW (ref 3.5–5.0)
Alkaline Phosphatase: 105 U/L (ref 38–126)
BILIRUBIN DIRECT: 0.1 mg/dL (ref 0.1–0.5)
Indirect Bilirubin: 0.5 mg/dL (ref 0.3–0.9)
Total Bilirubin: 0.6 mg/dL (ref 0.3–1.2)
Total Protein: 7 g/dL (ref 6.5–8.1)

## 2017-04-04 LAB — TROPONIN I: TROPONIN I: 0.03 ng/mL — AB (ref <0.03)

## 2017-04-04 LAB — LIPASE, BLOOD: LIPASE: 21 U/L (ref 11–51)

## 2017-04-04 MED ORDER — IOPAMIDOL (ISOVUE-300) INJECTION 61%
100.0000 mL | Freq: Once | INTRAVENOUS | Status: AC | PRN
  Administered 2017-04-04: 100 mL via INTRAVENOUS

## 2017-04-04 MED ORDER — ONDANSETRON 4 MG PO TBDP: 4.0000 mg | ORAL_TABLET | Freq: Three times a day (TID) | ORAL | 0 refills | Status: DC | PRN

## 2017-04-04 MED ORDER — ONDANSETRON HCL 4 MG/2ML IJ SOLN
4.0000 mg | Freq: Once | INTRAMUSCULAR | Status: AC
  Administered 2017-04-04: 4 mg via INTRAVENOUS
  Filled 2017-04-04: qty 2

## 2017-04-04 NOTE — Discharge Instructions (Signed)
Please make an appointment to see her primary care physician on Monday for recheck. Return to the emergency department sooner for any concerns such as if you cannot eat or drink, chest pain, shortness of breath, or for any other concerns.  It was a pleasure to take care of you today, and thank you for coming to our emergency department.  If you have any questions or concerns before leaving please ask the nurse to grab me and I'm more than happy to go through your aftercare instructions again.  If you were prescribed any opioid pain medication today such as Norco, Vicodin, Percocet, morphine, hydrocodone, or oxycodone please make sure you do not drive when you are taking this medication as it can alter your ability to drive safely.  If you have any concerns once you are home that you are not improving or are in fact getting worse before you can make it to your follow-up appointment, please do not hesitate to call [phone] and come back for further evaluation.  Darel Hong MD  Results for orders placed or performed during the hospital encounter of 28/36/62  Basic metabolic panel  Result Value Ref Range   Sodium 139 135 - 145 mmol/L   Potassium 3.3 (L) 3.5 - 5.1 mmol/L   Chloride 101 101 - 111 mmol/L   CO2 26 22 - 32 mmol/L   Glucose, Bld 225 (H) 65 - 99 mg/dL   BUN 27 (H) 6 - 20 mg/dL   Creatinine, Ser 3.96 (H) 0.44 - 1.00 mg/dL   Calcium 8.6 (L) 8.9 - 10.3 mg/dL   GFR calc non Af Amer 10 (L) >60 mL/min   GFR calc Af Amer 12 (L) >60 mL/min   Anion gap 12 5 - 15  Hepatic function panel  Result Value Ref Range   Total Protein 7.0 6.5 - 8.1 g/dL   Albumin 3.4 (L) 3.5 - 5.0 g/dL   AST 48 (H) 15 - 41 U/L   ALT 42 14 - 54 U/L   Alkaline Phosphatase 105 38 - 126 U/L   Total Bilirubin 0.6 0.3 - 1.2 mg/dL   Bilirubin, Direct 0.1 0.1 - 0.5 mg/dL   Indirect Bilirubin 0.5 0.3 - 0.9 mg/dL  CBC with Differential  Result Value Ref Range   WBC 10.2 3.6 - 11.0 K/uL   RBC 2.86 (L) 3.80 - 5.20 MIL/uL     Hemoglobin 9.9 (L) 12.0 - 16.0 g/dL   HCT 30.4 (L) 35.0 - 47.0 %   MCV 106.3 (H) 80.0 - 100.0 fL   MCH 34.5 (H) 26.0 - 34.0 pg   MCHC 32.4 32.0 - 36.0 g/dL   RDW 18.5 (H) 11.5 - 14.5 %   Platelets 176 150 - 440 K/uL   Neutrophils Relative % 83 %   Neutro Abs 8.5 (H) 1.4 - 6.5 K/uL   Lymphocytes Relative 10 %   Lymphs Abs 1.0 1.0 - 3.6 K/uL   Monocytes Relative 5 %   Monocytes Absolute 0.5 0.2 - 0.9 K/uL   Eosinophils Relative 1 %   Eosinophils Absolute 0.1 0 - 0.7 K/uL   Basophils Relative 1 %   Basophils Absolute 0.1 0 - 0.1 K/uL  Lipase, blood  Result Value Ref Range   Lipase 21 11 - 51 U/L  Troponin I  Result Value Ref Range   Troponin I 0.03 (HH) <0.03 ng/mL   Dg Chest 2 View  Result Date: 04/04/2017 CLINICAL DATA:  Onset of nausea and vomiting this morning. The patient  has dialysis dependent renal failure. No abdominal pain but reports generalized weakness EXAM: CHEST  2 VIEW COMPARISON:  Chest x-ray of Mar 10, 2017 FINDINGS: The lungs are adequately inflated. There is no focal infiltrate nor pleural effusion. The interstitial markings are slightly more conspicuous today especially on the left. The cardiac silhouette remains enlarged. The pulmonary vascularity is slightly more prominent. There is calcification in the wall of the aortic arch. The bony thorax exhibits no acute abnormality. IMPRESSION: Low-grade CHF slightly more conspicuous than on the earlier study. No discrete pneumonia. Thoracic aortic atherosclerosis. Electronically Signed   By: David  Martinique M.D.   On: 04/04/2017 07:53   Dg Chest 2 View  Result Date: 03/10/2017 CLINICAL DATA:  Cough for 1 week. No fever. Former smoker with history of coronary artery disease, hypertension and renal failure. EXAM: CHEST  2 VIEW COMPARISON:  01/22/2017 and 12/11/2016 radiographs. FINDINGS: The hemodialysis catheter has been removed. There is stable cardiomegaly and aortic atherosclerosis. There is chronic vascular congestion and  asymmetric prominence of the right hilum, unchanged from 2016, attributed to vascular structures. No overt pulmonary edema, confluent airspace opacity, pleural effusion or pneumothorax seen. The bones appear unchanged. IMPRESSION: Stable cardiomegaly and chronic vascular congestion. No acute findings demonstrated. Electronically Signed   By: Richardean Sale M.D.   On: 03/10/2017 13:30   Ct Abdomen Pelvis W Contrast  Result Date: 04/04/2017 CLINICAL DATA:  Nausea and vomiting with pain. Chronic renal failure EXAM: CT ABDOMEN AND PELVIS WITH CONTRAST TECHNIQUE: Multidetector CT imaging of the abdomen and pelvis was performed using the standard protocol following bolus administration of intravenous contrast. Oral contrast was also administered. CONTRAST:  174mL ISOVUE-300 IOPAMIDOL (ISOVUE-300) INJECTION 61% COMPARISON:  August 08, 2015 FINDINGS: Lower chest: There is bibasilar atelectatic change. There is cardiomegaly. There are foci of coronary artery calcification. Visualized pericardium is not appreciably thickened. Hepatobiliary: The liver shows decreased attenuation with periportal edema. This appearance is likely due to passive congestion. There may be a degree of underlying hepatic steatosis as well. There is a cyst in the medial segment of the left lobe of the liver measuring 1.3 x 1.0 cm. Portal and hepatic veins appear patent. Gallbladder is absent. There is prominence of the common bile duct with tapering distally. Proximal common bile duct measures 11 mm. No mass or calculus evident in the biliary ductal system. Pancreas: There is no pancreatic mass or inflammatory focus. Spleen: No splenic lesions are evident. Adrenals/Urinary Tract: Adrenals appear unremarkable bilaterally. There are multiple subcentimeter cysts in each kidney, more notable on the right. There is extensive peripheral renal artery vascular calcification bilaterally. There is no hydronephrosis on either side. There is no renal or  ureteral calculus on either side. The wall of the urinary bladder is mildly thickened. Stomach/Bowel: There is no appreciable bowel wall or mesenteric thickening. No evident bowel obstruction. No free air or portal venous air. No bowel pneumatosis. Vascular/Lymphatic: There is extensive atherosclerotic calcification throughout the aorta and common iliac arteries. There is extensive hypogastric artery calcification as well. This calcification at the origins of the major mesenteric arteries. Note is marked calcification with high-grade obstruction at the origins of each renal artery. No abdominal aortic aneurysm noted. Atherosclerotic calcification extends into peripheral branches of the major mesenteric arteries without frank obstruction evident. There is no evident adenopathy in the abdomen or pelvis. Reproductive: Uterus is absent. There is a cyst arising from the left ovary measuring 2.4 x 2.0 cm. No other pelvic mass evident. Other: There is  no periappendiceal region inflammation. There is a slight amount of ascites noted in the right abdomen and posterior pelvic regions. No abscess evident in the abdomen or pelvis. There is mild anasarca, characterized by abdominal wall edema and subtle mesenteric haziness, felt represent slight edema. Musculoskeletal: There is degenerative change in the lumbar spine. There are no blastic or lytic bone lesions. No intramuscular lesions are evident. IMPRESSION: Findings felt to be indicative of congestive heart failure. There is cardiomegaly with mild anasarca change. Liver has an appearance suggesting passive congestion. Rather minimal ascites is present, likely a sequela of chronic renal failure and congestive heart failure. No bowel wall thickening or bowel obstruction. No abscess. No demonstrable bowel pneumatosis. There is extensive atherosclerotic calcification in the aorta, multiple pelvic arterial vessels, and well as in the major mesenteric vessels. There is high-grade  obstruction in the proximal renal arteries bilaterally as well as extensive peripheral renal artery calcification. Uterus absent. 2.4 x 2.0 cm left ovarian cyst. Suspect simple cyst. It may be prudent to correlate with pelvic ultrasound nonemergently given ovarian cyst in this age group. A small ovarian cystadenoma is a differential consideration. Wall thickening in the urinary bladder may indicate a degree of underlying cystitis. No hydronephrosis on either side. No renal or ureteral calculi evident. Multiple small renal cysts may be seen as a sequela of chronic renal failure. Electronically Signed   By: Lowella Grip III M.D.   On: 04/04/2017 09:59

## 2017-04-04 NOTE — ED Notes (Signed)
Pt in CT.

## 2017-04-04 NOTE — ED Triage Notes (Signed)
Pt arrives via EMS from home, states nausea and vomiting that began this AM, dialysis MWF, denies any abd pain but states  Feeling weak, awake and alert in acute distress, EMS reports giving 4mg  IV zofran, pt dry heaving upon arrival

## 2017-04-04 NOTE — ED Provider Notes (Signed)
[facility] Emergency Department Provider Note  ____________________________________________   First MD Initiated Contact with Patient 04/04/17 [phone]     (approximate)  I have reviewed the triage vital signs and the nursing notes.   HISTORY  Chief Complaint Nausea; Emesis; and Diarrhea    HPI Laurie Steele is a 76 y.o. female who comes to the emergency department via EMS for nausea and vomiting and one loose stool that began this morning. She denies abdominal pain. She is complex past medical history including end-stage renal disease for which she receives dialysis Monday Wednesday and Friday through a fistula in her left upper extremity. She has a remote history of cholecystectomy as well as abdominal hysterectomy. She does urinate once daily. She denies dysuria. She denies any sick contacts. She denies chest pain or shortness of breath. Nothing seems to make her nausea better or worse.   Past Medical History:  Diagnosis Date  . CAD (coronary artery disease)   . Chronic anemia    Dr Inez Pilgrim  . GERD (gastroesophageal reflux disease)   . Glaucoma   . Gout   . Hx of colonic polyps   . Hyperlipidemia   . Hypertension   . Lower back pain   . Miscarriage   . 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 Peritoneal dialysis status ([place])   . Renal failure    Dr Holley Raring  . Renal insufficiency   . 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 Vaginal delivery    x 4    Patient Active Problem List   Diagnosis Date Noted  . UTI (urinary tract infection) 01/22/2017  . CHF (congestive heart failure) ([place]) 12/11/2016  . Ds DNA antibody positive 07/09/2016  . Pneumonia 07/03/2016  . Anemia 06/17/2016  . Symptomatic anemia 06/15/2016  . Hypoglycemia 05/21/2016  . Chest pain 04/05/2016  . Hypercalcemia 03/14/2016  . Bronchitis  03/03/2012  . Type II or unspecified type diabetes mellitus with renal manifestations, not stated as uncontrolled(250.40) 12/18/2011  . END STAGE RENAL DISEASE 10/23/2010  . DM (diabetes mellitus), type 2 ([place]) 12/13/2009  . NEUROPATHY 12/13/2009  . GOUT 02/15/2009  . SLEEP DISORDER 02/15/2009  . GERD 02/23/2008  . Hyperlipidemia 05/14/2007  . ANEMIA, CHRONIC 05/14/2007  . Essential hypertension 05/14/2007    Past Surgical History:  Procedure Laterality Date  . ABDOMINAL HYSTERECTOMY  1983  . AV FISTULA PLACEMENT Left 09/21/2016   Procedure: ARTERIOVENOUS (AV) FISTULA CREATION ( BRACHIOCEPHALIC );  Surgeon: Katha Cabal, MD;  Location: [facility] ORS;  Service: Vascular;  Laterality: Left;  . BACK SURGERY  6/08   Dr Collier Salina  . BACK SURGERY  1987   disc  . CHOLECYSTECTOMY    . DIALYSIS/PERMA CATHETER REMOVAL N/A 02/25/2017   Procedure: Dialysis/Perma Catheter Removal;  Surgeon: Algernon Huxley, MD;  Location: [facility];  Service: Cardiovascular;  Laterality: N/A;  . INSERTION OF DIALYSIS CATHETER  2017  . PERITONEAL CATHETER INSERTION    . REMOVAL OF A DIALYSIS CATHETER N/A 09/21/2016   Procedure: REMOVAL OF A DIALYSIS CATHETER ( PERITONEAL DIALYSIS CATH );  Surgeon: Katha Cabal, MD;  Location: [facility] ORS;  Service: Vascular;  Laterality: N/A;    Prior to Admission medications   Medication Sig Start Date End Date Taking? Authorizing Provider  allopurinol (ZYLOPRIM) 100 MG tablet Take  200 tablets by mouth at bedtime.    Yes [provider]  amLODipine (NORVASC) 10 MG tablet Take 10 mg by mouth daily. 11/03/16  Yes [provider]  aspirin EC 81 MG tablet Take 1 tablet (81 mg total) by mouth daily. 06/18/16  Yes Fritzi Mandes, MD  bisacodyl (DULCOLAX) 5 MG EC tablet Take 5 mg by mouth daily as needed for moderate constipation.   Yes [provider]  cinacalcet (SENSIPAR) 90 MG tablet Take 90 mg by mouth daily. With lunch   Yes [provider]  cloNIDine (CATAPRES) 0.1 MG tablet Take 0.1 mg by mouth See admin instructions. tk 1 tab po bid on sun, mon, wed, and fri, then tk 1 tab qhs on tues, thurs, and sat 11/08/16  Yes [provider]  clopidogrel (PLAVIX) 75 MG tablet Take 1 tablet (75 mg total) by mouth daily. 01/26/17  Yes Gouru, Illene Silver, MD  colchicine 0.6 MG tablet Take 0.5 tablets (0.3 mg total) by mouth 2 (two) times a week. Patient taking differently: Take 0.6 mg by mouth 2 (two) times daily.  07/09/16  Yes Demetrios Loll, MD  dextromethorphan (DELSYM) 30 MG/5ML liquid Take 5 mLs by mouth every 12 (twelve) hours as needed for cough.   Yes [provider]  dextromethorphan-guaiFENesin (MUCINEX DM) 30-600 MG 12hr tablet Take 1 tablet by mouth 2 (two) times daily.   Yes [provider]  docusate sodium (COLACE) 100 MG capsule Take 1 capsule (100 mg total) by mouth daily as needed for mild constipation. 12/13/16  Yes Wieting, Richard, MD  famotidine (PEPCID) 20 MG tablet Take 20 mg by mouth at bedtime.  11/29/16  Yes [provider]  ferrous sulfate 325 (65 FE) MG tablet Take 1 tablet (325 mg total) by mouth daily with breakfast. 06/18/16  Yes Fritzi Mandes, MD  fluticasone (FLONASE) 50 MCG/ACT nasal spray Place 2 sprays into both nostrils daily.    Yes [provider]  furosemide (LASIX) 40 MG tablet Take 1 tablet (40 mg total) by mouth daily. Patient taking differently: Take 40 mg by mouth daily. Tk 1 tab qd on Sunday, Monday, Wednesday, and Friday 07/05/16  Yes Demetrios Loll, MD  gabapentin (NEURONTIN) 100 MG capsule Take 1 capsule (100 mg total) by mouth at bedtime. 07/05/16  Yes Demetrios Loll, MD  glimepiride (AMARYL) 1 MG tablet Take 1 mg by mouth daily with breakfast.   Yes [provider]  hydrALAZINE (APRESOLINE) 50 MG tablet Take 50 mg by mouth See admin instructions. Take 1 tab twice daily on Sunday, Monday, Wednesday and Friday. Take 1 tab at bedtime(on dialysis days) Tuesday,  Thursday, and saturday 11/29/16  Yes [provider]  ipratropium (ATROVENT) 0.03 % nasal spray Place 1 spray into both nostrils 3 (three) times daily.    Yes [provider]  isosorbide mononitrate (IMDUR) 60 MG 24 hr tablet Take 30 mg by mouth 2 (two) times daily. 11/03/16  Yes [provider]  latanoprost (XALATAN) 0.005 % ophthalmic solution Place 1 drop into both eyes at bedtime. 03/20/16  Yes [provider]  metoprolol succinate (TOPROL-XL) 25 MG 24 hr tablet Take 1 tablet (25 mg total) by mouth at bedtime. 12/13/16  Yes Wieting, Richard, MD  multivitamin (RENA-VIT) TABS tablet Take 1 tablet by mouth daily.   Yes [provider]  pantoprazole (PROTONIX) 40 MG tablet Take 1 tablet (40 mg total) by mouth daily. 06/18/16  Yes Fritzi Mandes, MD  pravastatin (PRAVACHOL) 40 MG tablet  Take 40 mg by mouth at bedtime.  06/21/15  Yes [provider]  sevelamer (RENVELA) 800 MG tablet Take 800 mg by mouth 3 (three) times daily with meals.    Yes [provider]  vitamin C (ASCORBIC ACID) 500 MG tablet Take 500 mg by mouth 2 (two) times daily.   Yes [provider]  acetaminophen (TYLENOL) 650 MG CR tablet Take 650 mg by mouth every 8 (eight) hours as needed for pain.    [provider]  diclofenac sodium (VOLTAREN) 1 % GEL Apply 2 g four times a day PRN Not a candidate for oral NSAIDS, ESRD on PD 06/27/16   [provider]  feeding supplement, ENSURE ENLIVE, (ENSURE ENLIVE) LIQD Take 237 mLs by mouth 2 (two) times daily between meals. 01/25/17   Max Sane, MD  insulin aspart (NOVOLOG) 100 UNIT/ML injection Inject 0-5 Units into the skin at bedtime. Patient not taking: Reported on 04/04/2017 05/28/16   Fritzi Mandes, MD  ipratropium-albuterol (DUONEB) 0.5-2.5 (3) MG/3ML SOLN Take 3 mLs by nebulization every 6 (six) hours as needed. 12/14/16   Gladstone Lighter, MD  ondansetron (ZOFRAN ODT) 4 MG disintegrating tablet Take 1 tablet  (4 mg total) by mouth every 8 (eight) hours as needed for nausea or vomiting. 04/04/17   Darel Hong, MD    Allergies Alprazolam  Family History  Problem Relation Age of Onset  . Heart attack Father   . Hypertension Mother   . Hyperlipidemia Mother   . Coronary artery disease Unknown        strong fam hx  . Kidney failure Brother   . Breast cancer Neg Hx     Social History Social History  Substance Use Topics  . Smoking status: Former Smoker    Quit date: 11/05/1990  . Smokeless tobacco: Never Used  . Alcohol use No    Review of Systems Constitutional: No fever/chills Eyes: No visual changes. ENT: No sore throat. Cardiovascular: Denies chest pain. Respiratory: Denies shortness of breath. Gastrointestinal: Negative abdominal pain.  Positive nausea, positive vomiting.  Positive diarrhea.  No constipation. Genitourinary: Negative for dysuria. Musculoskeletal: Negative for back pain. Skin: Negative for rash. Neurological: Negative for headaches, focal weakness or numbness.   ____________________________________________   PHYSICAL EXAM:  VITAL SIGNS: ED Triage Vitals  Enc Vitals Group     BP 04/04/17 0712 (!) 142/80     Pulse Rate 04/04/17 0712 79     Resp 04/04/17 0712 16     Temp 04/04/17 0712 98.4 F (36.9 C)     Temp Source 04/04/17 0712 Oral     SpO2 04/04/17 0712 99 %     Weight 04/04/17 0713 185 lb 3 oz (84 kg)     Height 04/04/17 0713 5\' 7"  (1.702 m)     Head Circumference --      Peak Flow --      Pain Score 04/04/17 0712 0     Pain Loc --      Pain Edu? --      Excl. in [place]? --     Constitutional: Alert and oriented x 4 well appearing nontoxic no diaphoresis speaks in full, clear sentences Eyes: PERRL EOMI. Head: Atraumatic. Nose: No congestion/rhinnorhea. Mouth/Throat: No trismus Neck: No stridor.   Cardiovascular: Normal rate, regular rhythm. Grossly normal heart sounds.  Good peripheral circulation.Able to lie completely flat Respiratory:  Normal respiratory effort.  No retractions. Lungs CTAB and moving good air Gastrointestinal: Soft nondistended mild diffuse tenderness without  focality no rebound or guarding no peritonitis Musculoskeletal: No lower extremity edema  fistula placed left upper extremity with good thrill Neurologic:  Normal speech and language. No gross focal neurologic deficits are appreciated. Skin:  Skin is warm, dry and intact. No rash noted. Psychiatric: Mood and affect are normal. Speech and behavior are normal.     ____________________________________________   LABS (all labs ordered are listed, but only abnormal results are displayed)  Labs Reviewed  BASIC METABOLIC PANEL - Abnormal; Notable for the following:       Result Value   Potassium 3.3 (*)    Glucose, Bld 225 (*)    BUN 27 (*)    Creatinine, Ser 3.96 (*)    Calcium 8.6 (*)    GFR calc non Af Amer 10 (*)    GFR calc Af Amer 12 (*)    All other components within normal limits  HEPATIC FUNCTION PANEL - Abnormal; Notable for the following:    Albumin 3.4 (*)    AST 48 (*)    All other components within normal limits  CBC WITH DIFFERENTIAL/PLATELET - Abnormal; Notable for the following:    RBC 2.86 (*)    Hemoglobin 9.9 (*)    HCT 30.4 (*)    MCV 106.3 (*)    MCH 34.5 (*)    RDW 18.5 (*)    Neutro Abs 8.5 (*)    All other components within normal limits  TROPONIN I - Abnormal; Notable for the following:    Troponin I 0.03 (*)    All other components within normal limits  LIPASE, BLOOD    Slightly elevated troponin but in the setting of chronic kidney disease is clinically insignificant __________________________________________  EKG  ED ECG REPORT I, Darel Hong, the attending physician, personally viewed and interpreted this ECG.  Date: 04/04/2017 Rate: 76 Rhythm: normal sinus rhythm QRS Axis: normal Intervals: Prolonged QTC ST/T Wave abnormalities: normal Conduction Disturbances: none Narrative Interpretation:  Abnormal  ____________________________________________  RADIOLOGY  CT scans with no acute findings to indicate why the patient is having abdominal pain ____________________________________________   PROCEDURES  Procedure(s) performed: no  Procedures  Critical Care performed: no  Observation: no ____________________________________________   INITIAL IMPRESSION / ASSESSMENT AND PLAN / ED COURSE  Pertinent labs & imaging results that were available during my care of the patient were reviewed by me and considered in my medical decision making (see chart for details).  The patient arrives uncomfortable appearing retching although not actually vomiting much. I appreciate her significantly elevated QTC although she persistently is retching and spitting so I do believe she warrants more antiemetics. I will be cautious and keep her on the monitor.     ----------------------------------------- 9:16 AM on 04/04/2017 -----------------------------------------  The patient feels somewhat improved although not completely. I have an unclear etiology of her nausea and vomiting of all this likely benign given her advanced age and comorbidities I cannot rule out an intra-abdominal process. A CT scan with IV contrast is pending.  Fortunately the patient's CT scan is negative for acute abdominal pathology. She has not vomited since she has been in the hospital. Her family members are at bedside who can take her home. She is discharged home in improved condition with strict return precautions given.   FINAL CLINICAL IMPRESSION(S) / ED DIAGNOSES  Final diagnoses:  Nausea vomiting and diarrhea      NEW MEDICATIONS STARTED DURING THIS VISIT:  Discharge Medication List as of 04/04/2017 10:38 AM  START taking these medications   Details  ondansetron (ZOFRAN ODT) 4 MG disintegrating tablet Take 1 tablet (4 mg total) by mouth every 8 (eight) hours as needed for nausea or vomiting., Starting  Thu 04/04/2017, Print         Note:  This document was prepared using Dragon voice recognition software and may include unintentional dictation errors.     Darel Hong, MD 04/04/17 [id number]

## 2017-04-04 NOTE — ED Notes (Signed)
EDP at bedside  

## 2017-04-04 NOTE — ED Notes (Signed)
Dr. Mable Paris notified of critical trop of 0.03

## 2017-04-04 NOTE — ED Notes (Signed)
EDP at bedside for update 

## 2017-04-04 NOTE — ED Notes (Signed)
Pt returned from CT, resting in bed in no distress, family at bedside 

## 2017-05-15 ENCOUNTER — Emergency Department: Payer: Medicare Other

## 2017-05-15 ENCOUNTER — Emergency Department
Admission: EM | Admit: 2017-05-15 | Discharge: 2017-05-15 | Disposition: A | Discharge status: Home / Self Care | Payer: Medicare Other | Attending: Emergency Medicine | Admitting: Emergency Medicine

## 2017-05-15 ENCOUNTER — Encounter: Payer: Self-pay | Admitting: Emergency Medicine

## 2017-05-15 DIAGNOSIS — Z7982 Long term (current) use of aspirin: Secondary | ICD-10-CM | POA: Insufficient documentation

## 2017-05-15 DIAGNOSIS — Y939 Activity, unspecified: Secondary | ICD-10-CM | POA: Diagnosis not present

## 2017-05-15 DIAGNOSIS — Y999 Unspecified external cause status: Secondary | ICD-10-CM | POA: Insufficient documentation

## 2017-05-15 DIAGNOSIS — N186 End stage renal disease: Secondary | ICD-10-CM | POA: Diagnosis not present

## 2017-05-15 DIAGNOSIS — E1122 Type 2 diabetes mellitus with diabetic chronic kidney disease: Secondary | ICD-10-CM | POA: Diagnosis not present

## 2017-05-15 DIAGNOSIS — Z992 Dependence on renal dialysis: Secondary | ICD-10-CM | POA: Insufficient documentation

## 2017-05-15 DIAGNOSIS — I509 Heart failure, unspecified: Secondary | ICD-10-CM | POA: Insufficient documentation

## 2017-05-15 DIAGNOSIS — W228XXA Striking against or struck by other objects, initial encounter: Secondary | ICD-10-CM | POA: Diagnosis not present

## 2017-05-15 DIAGNOSIS — I132 Hypertensive heart and chronic kidney disease with heart failure and with stage 5 chronic kidney disease, or end stage renal disease: Secondary | ICD-10-CM | POA: Diagnosis not present

## 2017-05-15 DIAGNOSIS — Y929 Unspecified place or not applicable: Secondary | ICD-10-CM | POA: Diagnosis not present

## 2017-05-15 DIAGNOSIS — S0003XA Contusion of scalp, initial encounter: Secondary | ICD-10-CM

## 2017-05-15 DIAGNOSIS — Z794 Long term (current) use of insulin: Secondary | ICD-10-CM | POA: Diagnosis not present

## 2017-05-15 DIAGNOSIS — S0990XA Unspecified injury of head, initial encounter: Secondary | ICD-10-CM | POA: Diagnosis present

## 2017-05-15 DIAGNOSIS — Z79899 Other long term (current) drug therapy: Secondary | ICD-10-CM | POA: Insufficient documentation

## 2017-05-15 DIAGNOSIS — Z7902 Long term (current) use of antithrombotics/antiplatelets: Secondary | ICD-10-CM | POA: Diagnosis not present

## 2017-05-15 DIAGNOSIS — S060X0A Concussion without loss of consciousness, initial encounter: Secondary | ICD-10-CM

## 2017-05-15 DIAGNOSIS — Z87891 Personal history of nicotine dependence: Secondary | ICD-10-CM | POA: Insufficient documentation

## 2017-05-15 NOTE — ED Provider Notes (Signed)
[facility] Emergency Department Provider Note  ____________________________________________  Time seen: Approximately 9:56 PM  I have reviewed the triage vital signs and the nursing notes.   HISTORY  Chief Complaint Head Injury    HPI Laurie Steele is a 76 y.o. female reports losing her balance when she tried to open her door to let her granddaughter in, causing her to fall and hit the top of her head on the doorknob. She denies loss of consciousness. No headache currently. No neck pain. No numbness tingling or weakness or vision changes. She only uses a walker, but since it was only a short distance to the door, she tried to go without using her walker, causing her to lose her balance and fall. She had previously been in her usual state of health.  She does have dialysis and has been going at her scheduled sessions including earlier today. She completed the session without incident.     Past Medical History:  Diagnosis Date  . CAD (coronary artery disease)   . Chronic anemia    Dr Inez Pilgrim  . GERD (gastroesophageal reflux disease)   . Glaucoma   . Gout   . Hx of colonic polyps   . Hyperlipidemia   . Hypertension   . Lower back pain   . Miscarriage   . 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 Peritoneal dialysis status ([place])   . Renal failure    Dr Holley Raring  . Renal insufficiency   . 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 Vaginal delivery    x 4     Patient Active Problem List   Diagnosis Date Noted  . UTI (urinary tract infection) 01/22/2017  . CHF (congestive heart failure) ([place]) 12/11/2016  . Ds DNA antibody positive 07/09/2016  . Pneumonia 07/03/2016  . Anemia 06/17/2016  . Symptomatic anemia 06/15/2016  . Hypoglycemia 05/21/2016  . Chest pain 04/05/2016  . Hypercalcemia 03/14/2016  .  Bronchitis 03/03/2012  . Type II or unspecified type diabetes mellitus with renal manifestations, not stated as uncontrolled(250.40) 12/18/2011  . END STAGE RENAL DISEASE 10/23/2010  . DM (diabetes mellitus), type 2 ([name]) 12/13/2009  . NEUROPATHY 12/13/2009  . GOUT 02/15/2009  . SLEEP DISORDER 02/15/2009  . GERD 02/23/2008  . Hyperlipidemia 05/14/2007  . ANEMIA, CHRONIC 05/14/2007  . Essential hypertension 05/14/2007     Past Surgical History:  Procedure Laterality Date  . ABDOMINAL HYSTERECTOMY  1983  . AV FISTULA PLACEMENT Left 09/21/2016   Procedure: ARTERIOVENOUS (AV) FISTULA CREATION ( BRACHIOCEPHALIC );  Surgeon: Katha Cabal, MD;  Location: [facility] ORS;  Service: Vascular;  Laterality: Left;  . BACK SURGERY  6/08   Dr Collier Salina  . BACK SURGERY  1987   disc  . CHOLECYSTECTOMY    . DIALYSIS/PERMA CATHETER REMOVAL N/A 02/25/2017   Procedure: Dialysis/Perma Catheter Removal;  Surgeon: Algernon Huxley, MD;  Location: [facility];  Service: Cardiovascular;  Laterality: N/A;  . INSERTION OF DIALYSIS CATHETER  2017  . PERITONEAL CATHETER INSERTION    . REMOVAL OF A DIALYSIS CATHETER N/A 09/21/2016   Procedure: REMOVAL OF A DIALYSIS CATHETER ( PERITONEAL DIALYSIS CATH );  Surgeon: Katha Cabal, MD;  Location: [facility] ORS;  Service: Vascular;  Laterality: N/A;     Prior to Admission medications   Medication Sig Start  Date End Date Taking? Authorizing Provider  acetaminophen (TYLENOL) 650 MG CR tablet Take 650 mg by mouth every 8 (eight) hours as needed for pain.    [provider]  allopurinol (ZYLOPRIM) 100 MG tablet Take 200 tablets by mouth at bedtime.     [provider]  amLODipine (NORVASC) 10 MG tablet Take 10 mg by mouth daily. 11/03/16   [provider]  aspirin EC 81 MG tablet Take 1 tablet (81 mg total) by mouth daily. 06/18/16   Fritzi Mandes, MD  bisacodyl (DULCOLAX) 5 MG EC tablet Take 5 mg by mouth daily as needed for moderate  constipation.    [provider]  cinacalcet (SENSIPAR) 90 MG tablet Take 90 mg by mouth daily. With lunch    [provider]  cloNIDine (CATAPRES) 0.1 MG tablet Take 0.1 mg by mouth See admin instructions. tk 1 tab po bid on sun, mon, wed, and fri, then tk 1 tab qhs on tues, thurs, and sat 11/08/16   [provider]  clopidogrel (PLAVIX) 75 MG tablet Take 1 tablet (75 mg total) by mouth daily. 01/26/17   Nicholes Mango, MD  colchicine 0.6 MG tablet Take 0.5 tablets (0.3 mg total) by mouth 2 (two) times a week. Patient taking differently: Take 0.6 mg by mouth 2 (two) times daily.  07/09/16   Demetrios Loll, MD  dextromethorphan (DELSYM) 30 MG/5ML liquid Take 5 mLs by mouth every 12 (twelve) hours as needed for cough.    [provider]  dextromethorphan-guaiFENesin (MUCINEX DM) 30-600 MG 12hr tablet Take 1 tablet by mouth 2 (two) times daily.    [provider]  diclofenac sodium (VOLTAREN) 1 % GEL Apply 2 g four times a day PRN Not a candidate for oral NSAIDS, ESRD on PD 06/27/16   [provider]  docusate sodium (COLACE) 100 MG capsule Take 1 capsule (100 mg total) by mouth daily as needed for mild constipation. 12/13/16   Loletha Grayer, MD  famotidine (PEPCID) 20 MG tablet Take 20 mg by mouth at bedtime.  11/29/16   [provider]  feeding supplement, ENSURE ENLIVE, (ENSURE ENLIVE) LIQD Take 237 mLs by mouth 2 (two) times daily between meals. 01/25/17   Max Sane, MD  ferrous sulfate 325 (65 FE) MG tablet Take 1 tablet (325 mg total) by mouth daily with breakfast. 06/18/16   Fritzi Mandes, MD  fluticasone [facility]) 50 MCG/ACT nasal spray Place 2 sprays into both nostrils daily.     [provider]  furosemide (LASIX) 40 MG tablet Take 1 tablet (40 mg total) by mouth daily. Patient taking differently: Take 40 mg by mouth daily. Tk 1 tab qd on Sunday, Monday, Wednesday, and Friday 07/05/16   Demetrios Loll, MD  gabapentin (NEURONTIN) 100 MG  capsule Take 1 capsule (100 mg total) by mouth at bedtime. 07/05/16   Demetrios Loll, MD  glimepiride (AMARYL) 1 MG tablet Take 1 mg by mouth daily with breakfast.    [provider]  hydrALAZINE (APRESOLINE) 50 MG tablet Take 50 mg by mouth See admin instructions. Take 1 tab twice daily on Sunday, Monday, Wednesday and Friday. Take 1 tab at bedtime(on dialysis days) Tuesday, Thursday, and saturday 11/29/16   [provider]  insulin aspart (NOVOLOG) 100 UNIT/ML injection Inject 0-5 Units into the skin at bedtime. Patient not taking: Reported on 04/04/2017 05/28/16   Fritzi Mandes, MD  ipratropium (ATROVENT) 0.03 % nasal spray Place 1 spray into both nostrils 3 (three) times daily.  [provider]  ipratropium-albuterol (DUONEB) 0.5-2.5 (3) MG/3ML SOLN Take 3 mLs by nebulization every 6 (six) hours as needed. 12/14/16   Gladstone Lighter, MD  isosorbide mononitrate (IMDUR) 60 MG 24 hr tablet Take 30 mg by mouth 2 (two) times daily. 11/03/16   [provider]  latanoprost (XALATAN) 0.005 % ophthalmic solution Place 1 drop into both eyes at bedtime. 03/20/16   [provider]  metoprolol succinate (TOPROL-XL) 25 MG 24 hr tablet Take 1 tablet (25 mg total) by mouth at bedtime. 12/13/16   Loletha Grayer, MD  multivitamin (RENA-VIT) TABS tablet Take 1 tablet by mouth daily.    [provider]  ondansetron (ZOFRAN ODT) 4 MG disintegrating tablet Take 1 tablet (4 mg total) by mouth every 8 (eight) hours as needed for nausea or vomiting. 04/04/17   Darel Hong, MD  pantoprazole (PROTONIX) 40 MG tablet Take 1 tablet (40 mg total) by mouth daily. 06/18/16   Fritzi Mandes, MD  pravastatin (PRAVACHOL) 40 MG tablet Take 40 mg by mouth at bedtime.  06/21/15   [provider]  sevelamer (RENVELA) 800 MG tablet Take 800 mg by mouth 3 (three) times daily with meals.     [provider]  vitamin C (ASCORBIC ACID) 500 MG tablet Take 500 mg by mouth 2 (two)  times daily.    [provider]     Allergies Alprazolam   Family History  Problem Relation Age of Onset  . Heart attack Father   . Hypertension Mother   . Hyperlipidemia Mother   . Coronary artery disease Unknown        strong fam hx  . Kidney failure Brother   . Breast cancer Neg Hx     Social History Social History  Substance Use Topics  . Smoking status: Former Smoker    Quit date: 11/05/1990  . Smokeless tobacco: Never Used  . Alcohol use No    Review of Systems  Constitutional:   No fever or chills.  ENT:   No sore throat. No rhinorrhea. Cardiovascular:   No chest pain or syncope. Respiratory:   No dyspnea or cough. Gastrointestinal:   Negative for abdominal pain, vomiting and diarrhea.  Musculoskeletal:   Left parietal scalp pain, no neck pain All other systems reviewed and are negative except as documented above in ROS and HPI.  ____________________________________________   PHYSICAL EXAM:  VITAL SIGNS: ED Triage Vitals  Enc Vitals Group     BP 05/15/17 1909 132/69     Pulse Rate 05/15/17 1909 (!) 102     Resp 05/15/17 1909 20     Temp 05/15/17 1909 99.1 F (37.3 C)     Temp Source 05/15/17 1909 Oral     SpO2 05/15/17 2030 100 %     Weight 05/15/17 1910 160 lb (72.6 kg)     Height 05/15/17 1910 5\' 7"  (1.702 m)     Head Circumference --      Peak Flow --      Pain Score 05/15/17 1909 3     Pain Loc --      Pain Edu? --      Excl. in [place]? --     Vital signs reviewed, nursing assessments reviewed.   Constitutional:   Alert and oriented. Well appearing and in no distress. Eyes:   No scleral icterus.  EOMI. No nystagmus. No conjunctival pallor. PERRL. ENT   Head:   Normocephalic With small scalp contusion and hematoma on the left  parietal scalp. The laceration..   Nose:   No congestion/rhinnorhea.    Mouth/Throat:   MMM, no pharyngeal erythema. No peritonsillar mass.    Neck:   No meningismus. Full ROM. No midline  tenderness. Hematological/Lymphatic/Immunilogical:   No cervical lymphadenopathy. Cardiovascular:   RRR. Symmetric bilateral radial and DP pulses.  No murmurs. Dialysis access in left upper extremity unremarkable, hemostatic puncture sites. Respiratory:   Normal respiratory effort without tachypnea/retractions. Breath sounds are clear and equal bilaterally. No wheezes/rales/rhonchi. Gastrointestinal:   Soft and nontender. Non distended. There is no CVA tenderness.  No rebound, rigidity, or guarding. Genitourinary:   deferred Musculoskeletal:   Normal range of motion in all extremities. No joint effusions.  No lower extremity tenderness.  No edema. Weightbearing with baseline ambulatory dysfunction. No hip tenderness. No hand wrist or shoulder tenderness. No midline spinal tenderness Neurologic:   Normal speech and language.  Motor grossly intact. No gross focal neurologic deficits are appreciated.  Skin:    Skin is warm, dry and intact. No rash noted.  No petechiae, purpura, or bullae. There is an area of keloid scar on the proximal left upper arm posteriorly which the patient states is painful. This area is noninflamed, no evidence of infection, not consistent with malignancy.  ____________________________________________    LABS (pertinent positives/negatives) (all labs ordered are listed, but only abnormal results are displayed) Labs Reviewed - No data to display ____________________________________________   EKG    ____________________________________________    RADIOLOGY  Ct Head Wo Contrast  Result Date: 05/15/2017 CLINICAL DATA:  [place] opening a door, struck top of head on door frame. No loss of consciousness. EXAM: CT HEAD WITHOUT CONTRAST TECHNIQUE: Contiguous axial images were obtained from the base of the skull through the vertex without intravenous contrast. COMPARISON:  MRI of the head January 25, 2017 FINDINGS: BRAIN: No intraparenchymal hemorrhage, mass effect nor midline  shift. Moderate to severe ventriculomegaly on the basis of global parenchymal brain volume loss, stable from prior examination. Patchy supratentorial white matter hypodensities LEFT frontal lobe, stable given differences in imaging technique. No acute large vascular territory infarcts. No abnormal extra-axial fluid collections. Basal cisterns are patent. VASCULAR: Severe calcific atherosclerosis of the carotid siphons and included proximal intradural vertebral artery's. SKULL: No skull fracture. Small LEFT frontal vertex scalp hematoma. No subcutaneous gas or radiopaque foreign bodies. SINUSES/ORBITS: The mastoid air-cells and included paranasal sinuses are well-aerated.The included ocular globes and orbital contents are non-suspicious. OTHER: None. IMPRESSION: 1. No acute intracranial process. Small LEFT frontal scalp hematoma without skull fracture. 2. Stable moderate to severe parenchymal brain volume loss. 3. Stable mild chronic small vessel ischemic disease. 4. Severe atherosclerosis. Electronically Signed   By: Elon Alas M.D.   On: 05/15/2017 20:20    ____________________________________________   PROCEDURES Procedures  ____________________________________________   INITIAL IMPRESSION / ASSESSMENT AND PLAN / ED COURSE  Pertinent labs & imaging results that were available during my care of the patient were reviewed by me and considered in my medical decision making (see chart for details).  Patient presents after mechanical fall due to not using her walker. Well appearing no acute distress. No evidence of any serious traumatic injury. CT head negative. Counseled on concussion, avoid sedatives, rest hydrate eat well, follow-up with primary care. Discharged home in good condition.Considering the patient's symptoms, medical history, and physical examination today, I have low suspicion for ischemic stroke, intracranial hemorrhage, meningitis, encephalitis, carotid or vertebral dissection,  venous sinus thrombosis, MS, intracranial hypertension, glaucoma, CRAO, CRVO, or  temporal arteritis.       ____________________________________________   FINAL CLINICAL IMPRESSION(S) / ED DIAGNOSES  Final diagnoses:  Injury of head, initial encounter  Concussion without loss of consciousness, initial encounter  Hematoma of scalp, initial encounter      New Prescriptions   No medications on file     Portions of this note were generated with dragon dictation software. Dictation errors may occur despite best attempts at proofreading.    Carrie Mew, MD 05/15/17 2159

## 2017-05-15 NOTE — ED Triage Notes (Signed)
Pt states she lost her balance trying to open a door and fel hitting the top of head on door frame. Hematoma noted to area, no loc. Pt denies any other injury or complaints at this time.

## 2017-05-15 NOTE — ED Notes (Signed)
Pt denies having pain in head at this time and reports she has no other pains from the fall. Pt denies changes in vision, dizziness or lightheadedness. Pt reports she had tripped today and denies having felt dizzy prior to fall either. NAD noted, pt is alert and oriented x 4.

## 2017-05-15 NOTE — Discharge Instructions (Signed)
Results for orders placed or performed during the hospital encounter of 56/21/30  Basic metabolic panel  Result Value Ref Range   Sodium 139 135 - 145 mmol/L   Potassium 3.3 (L) 3.5 - 5.1 mmol/L   Chloride 101 101 - 111 mmol/L   CO2 26 22 - 32 mmol/L   Glucose, Bld 225 (H) 65 - 99 mg/dL   BUN 27 (H) 6 - 20 mg/dL   Creatinine, Ser 3.96 (H) 0.44 - 1.00 mg/dL   Calcium 8.6 (L) 8.9 - 10.3 mg/dL   GFR calc non Af Amer 10 (L) >60 mL/min   GFR calc Af Amer 12 (L) >60 mL/min   Anion gap 12 5 - 15  Hepatic function panel  Result Value Ref Range   Total Protein 7.0 6.5 - 8.1 g/dL   Albumin 3.4 (L) 3.5 - 5.0 g/dL   AST 48 (H) 15 - 41 U/L   ALT 42 14 - 54 U/L   Alkaline Phosphatase 105 38 - 126 U/L   Total Bilirubin 0.6 0.3 - 1.2 mg/dL   Bilirubin, Direct 0.1 0.1 - 0.5 mg/dL   Indirect Bilirubin 0.5 0.3 - 0.9 mg/dL  CBC with Differential  Result Value Ref Range   WBC 10.2 3.6 - 11.0 K/uL   RBC 2.86 (L) 3.80 - 5.20 MIL/uL   Hemoglobin 9.9 (L) 12.0 - 16.0 g/dL   HCT 30.4 (L) 35.0 - 47.0 %   MCV 106.3 (H) 80.0 - 100.0 fL   MCH 34.5 (H) 26.0 - 34.0 pg   MCHC 32.4 32.0 - 36.0 g/dL   RDW 18.5 (H) 11.5 - 14.5 %   Platelets 176 150 - 440 K/uL   Neutrophils Relative % 83 %   Neutro Abs 8.5 (H) 1.4 - 6.5 K/uL   Lymphocytes Relative 10 %   Lymphs Abs 1.0 1.0 - 3.6 K/uL   Monocytes Relative 5 %   Monocytes Absolute 0.5 0.2 - 0.9 K/uL   Eosinophils Relative 1 %   Eosinophils Absolute 0.1 0 - 0.7 K/uL   Basophils Relative 1 %   Basophils Absolute 0.1 0 - 0.1 K/uL  Lipase, blood  Result Value Ref Range   Lipase 21 11 - 51 U/L  Troponin I  Result Value Ref Range   Troponin I 0.03 (HH) <0.03 ng/mL   Ct Head Wo Contrast  Result Date: 05/15/2017 CLINICAL DATA:  [place] opening a door, struck top of head on door frame. No loss of consciousness. EXAM: CT HEAD WITHOUT CONTRAST TECHNIQUE: Contiguous axial images were obtained from the base of the skull through the vertex without intravenous contrast.  COMPARISON:  MRI of the head January 25, 2017 FINDINGS: BRAIN: No intraparenchymal hemorrhage, mass effect nor midline shift. Moderate to severe ventriculomegaly on the basis of global parenchymal brain volume loss, stable from prior examination. Patchy supratentorial white matter hypodensities LEFT frontal lobe, stable given differences in imaging technique. No acute large vascular territory infarcts. No abnormal extra-axial fluid collections. Basal cisterns are patent. VASCULAR: Severe calcific atherosclerosis of the carotid siphons and included proximal intradural vertebral artery's. SKULL: No skull fracture. Small LEFT frontal vertex scalp hematoma. No subcutaneous gas or radiopaque foreign bodies. SINUSES/ORBITS: The mastoid air-cells and included paranasal sinuses are well-aerated.The included ocular globes and orbital contents are non-suspicious. OTHER: None. IMPRESSION: 1. No acute intracranial process. Small LEFT frontal scalp hematoma without skull fracture. 2. Stable moderate to severe parenchymal brain volume loss. 3. Stable mild chronic small vessel ischemic disease. 4. Severe atherosclerosis. Electronically  Signed   By: Elon Alas M.D.   On: 05/15/2017 20:20

## 2017-05-16 ENCOUNTER — Ambulatory Visit (INDEPENDENT_AMBULATORY_CARE_PROVIDER_SITE_OTHER): Payer: 59 | Admitting: Vascular Surgery

## 2017-05-16 ENCOUNTER — Encounter (INDEPENDENT_AMBULATORY_CARE_PROVIDER_SITE_OTHER): Payer: 59

## 2017-07-11 ENCOUNTER — Encounter: Payer: Self-pay | Admitting: Emergency Medicine

## 2017-07-11 ENCOUNTER — Emergency Department
Admission: EM | Admit: 2017-07-11 | Discharge: 2017-07-11 | Disposition: A | Discharge status: Home / Self Care | Payer: Medicare Other | Attending: Student in an Organized Health Care Education/Training Program | Admitting: Student in an Organized Health Care Education/Training Program

## 2017-07-11 ENCOUNTER — Emergency Department: Payer: Medicare Other

## 2017-07-11 DIAGNOSIS — I509 Heart failure, unspecified: Secondary | ICD-10-CM | POA: Diagnosis not present

## 2017-07-11 DIAGNOSIS — Z794 Long term (current) use of insulin: Secondary | ICD-10-CM | POA: Diagnosis not present

## 2017-07-11 DIAGNOSIS — N186 End stage renal disease: Secondary | ICD-10-CM | POA: Diagnosis not present

## 2017-07-11 DIAGNOSIS — I132 Hypertensive heart and chronic kidney disease with heart failure and with stage 5 chronic kidney disease, or end stage renal disease: Secondary | ICD-10-CM | POA: Insufficient documentation

## 2017-07-11 DIAGNOSIS — I2510 Atherosclerotic heart disease of native coronary artery without angina pectoris: Secondary | ICD-10-CM | POA: Diagnosis not present

## 2017-07-11 DIAGNOSIS — Z87891 Personal history of nicotine dependence: Secondary | ICD-10-CM | POA: Diagnosis not present

## 2017-07-11 DIAGNOSIS — I493 Ventricular premature depolarization: Secondary | ICD-10-CM | POA: Diagnosis not present

## 2017-07-11 DIAGNOSIS — Z7902 Long term (current) use of antithrombotics/antiplatelets: Secondary | ICD-10-CM | POA: Insufficient documentation

## 2017-07-11 DIAGNOSIS — R531 Weakness: Secondary | ICD-10-CM | POA: Diagnosis present

## 2017-07-11 DIAGNOSIS — Z992 Dependence on renal dialysis: Secondary | ICD-10-CM | POA: Diagnosis not present

## 2017-07-11 DIAGNOSIS — Z7982 Long term (current) use of aspirin: Secondary | ICD-10-CM | POA: Diagnosis not present

## 2017-07-11 DIAGNOSIS — Z79899 Other long term (current) drug therapy: Secondary | ICD-10-CM | POA: Diagnosis not present

## 2017-07-11 DIAGNOSIS — Z791 Long term (current) use of non-steroidal anti-inflammatories (NSAID): Secondary | ICD-10-CM | POA: Insufficient documentation

## 2017-07-11 DIAGNOSIS — R002 Palpitations: Secondary | ICD-10-CM

## 2017-07-11 DIAGNOSIS — E1122 Type 2 diabetes mellitus with diabetic chronic kidney disease: Secondary | ICD-10-CM | POA: Insufficient documentation

## 2017-07-11 LAB — CBC WITH DIFFERENTIAL/PLATELET
BASOS PCT: 1 %
Basophils Absolute: 0.1 10*3/uL (ref 0–0.1)
EOS ABS: 0.2 10*3/uL (ref 0–0.7)
EOS PCT: 3 %
HCT: 34.4 % — ABNORMAL LOW (ref 35.0–47.0)
HEMOGLOBIN: 11.3 g/dL — AB (ref 12.0–16.0)
Lymphocytes Relative: 21 %
Lymphs Abs: 1.5 10*3/uL (ref 1.0–3.6)
MCH: 34.9 pg — ABNORMAL HIGH (ref 26.0–34.0)
MCHC: 33 g/dL (ref 32.0–36.0)
MCV: 105.9 fL — ABNORMAL HIGH (ref 80.0–100.0)
MONOS PCT: 12 %
Monocytes Absolute: 0.9 10*3/uL (ref 0.2–0.9)
NEUTROS PCT: 63 %
Neutro Abs: 4.6 10*3/uL (ref 1.4–6.5)
PLATELETS: 144 10*3/uL — AB (ref 150–440)
RBC: 3.25 MIL/uL — ABNORMAL LOW (ref 3.80–5.20)
RDW: 18.1 % — AB (ref 11.5–14.5)
WBC: 7.2 10*3/uL (ref 3.6–11.0)

## 2017-07-11 LAB — COMPREHENSIVE METABOLIC PANEL
ALT: 33 U/L (ref 14–54)
AST: 23 U/L (ref 15–41)
Albumin: 3.4 g/dL — ABNORMAL LOW (ref 3.5–5.0)
Alkaline Phosphatase: 123 U/L (ref 38–126)
Anion gap: 12 (ref 5–15)
BILIRUBIN TOTAL: 0.6 mg/dL (ref 0.3–1.2)
BUN: 52 mg/dL — ABNORMAL HIGH (ref 6–20)
CALCIUM: 7.8 mg/dL — AB (ref 8.9–10.3)
CO2: 31 mmol/L (ref 22–32)
CREATININE: 5.82 mg/dL — AB (ref 0.44–1.00)
Chloride: 98 mmol/L — ABNORMAL LOW (ref 101–111)
GFR calc non Af Amer: 6 mL/min — ABNORMAL LOW (ref >60)
GFR, EST AFRICAN AMERICAN: 7 mL/min — AB (ref >60)
GLUCOSE: 154 mg/dL — AB (ref 65–99)
Potassium: 3.4 mmol/L — ABNORMAL LOW (ref 3.5–5.1)
Sodium: 141 mmol/L (ref 135–145)
TOTAL PROTEIN: 6.3 g/dL — AB (ref 6.5–8.1)

## 2017-07-11 LAB — TROPONIN I
TROPONIN I: 0.06 ng/mL — AB (ref <0.03)
TROPONIN I: 0.06 ng/mL — AB (ref <0.03)

## 2017-07-11 LAB — ETHANOL: Alcohol, Ethyl (B): <5 mg/dL (ref <5)

## 2017-07-11 MED ORDER — HYDRALAZINE HCL 50 MG PO TABS
50.0000 mg | ORAL_TABLET | Freq: Once | ORAL | Status: AC
  Administered 2017-07-11: 50 mg via ORAL
  Filled 2017-07-11: qty 1

## 2017-07-11 MED ORDER — METOPROLOL SUCCINATE ER 50 MG PO TB24
25.0000 mg | ORAL_TABLET | Freq: Every day | ORAL | Status: DC
  Administered 2017-07-11: 25 mg via ORAL
  Filled 2017-07-11: qty 1

## 2017-07-11 NOTE — ED Notes (Signed)
Dr Joni Fears, Dr Quentin Cornwall notified in person of pts troponin 0.06. No new orders

## 2017-07-11 NOTE — ED Notes (Signed)
Gave patient food tray and a diet sprite.

## 2017-07-11 NOTE — ED Provider Notes (Signed)
[facility] Emergency Department Provider Note    First MD Initiated Contact with Patient 07/11/17 1140     (approximate)  I have reviewed the triage vital signs and the nursing notes.   HISTORY  Chief Complaint Weakness    HPI Laurie Steele is a 76 y.o. female with a history of chronic kidney disease on Monday Wednesday Friday dialysis as well as CAD and chronic anemiawho presents to the ER after being found to have an irregular heartbeat by her home health nurse today. Home health nurse called her doctor who told her to come to the ER for further evaluation. Patient denies any discomfort. Some report of weakness with the patient is denying any weakness at this time.  States that she will have a brief less than 1 minute twinge of chest pain every now and then but denies any discomfort or shortness of breath. She's been compliant with her home medications as well as going to dialysis. No numbness or tingling. No fevers. No nausea or vomiting.   Past Medical History:  Diagnosis Date  . CAD (coronary artery disease)   . Chronic anemia    Dr Inez Pilgrim  . GERD (gastroesophageal reflux disease)   . Glaucoma   . Gout   . Hx of colonic polyps   . Hyperlipidemia   . Hypertension   . Lower back pain   . Miscarriage   . 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 Peritoneal dialysis status ([place])   . Renal failure    Dr Holley Raring  . Renal insufficiency   . 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 Vaginal delivery    x 4   Family History  Problem Relation Age of Onset  . Heart attack Father   . Hypertension Mother   . Hyperlipidemia Mother   . Coronary artery disease Unknown        strong fam hx  . Kidney failure Brother   . Breast cancer Neg Hx    Past Surgical History:  Procedure Laterality Date  . ABDOMINAL  HYSTERECTOMY  1983  . AV FISTULA PLACEMENT Left 09/21/2016   Procedure: ARTERIOVENOUS (AV) FISTULA CREATION ( BRACHIOCEPHALIC );  Surgeon: Katha Cabal, MD;  Location: [facility] ORS;  Service: Vascular;  Laterality: Left;  . BACK SURGERY  6/08   Dr Collier Salina  . BACK SURGERY  1987   disc  . CHOLECYSTECTOMY    . DIALYSIS/PERMA CATHETER REMOVAL N/A 02/25/2017   Procedure: Dialysis/Perma Catheter Removal;  Surgeon: Algernon Huxley, MD;  Location: [place] [facility];  Service: Cardiovascular;  Laterality: N/A;  . INSERTION OF DIALYSIS CATHETER  2017  . PERITONEAL CATHETER INSERTION    . REMOVAL OF A DIALYSIS CATHETER N/A 09/21/2016   Procedure: REMOVAL OF A DIALYSIS CATHETER ( PERITONEAL DIALYSIS CATH );  Surgeon: Katha Cabal, MD;  Location: [facility] ORS;  Service: Vascular;  Laterality: N/A;   Patient Active Problem List   Diagnosis Date Noted  . UTI (urinary tract infection) 01/22/2017  . CHF (congestive heart failure) ([place]) 12/11/2016  . Ds DNA antibody positive 07/09/2016  . Pneumonia 07/03/2016  . Anemia 06/17/2016  . Symptomatic anemia 06/15/2016  . Hypoglycemia 05/21/2016  . Chest pain 04/05/2016  . Hypercalcemia 03/14/2016  . Bronchitis 03/03/2012  . Type II or unspecified type diabetes mellitus with  renal manifestations, not stated as uncontrolled(250.40) 12/18/2011  . END STAGE RENAL DISEASE 10/23/2010  . DM (diabetes mellitus), type 2 ([place]) 12/13/2009  . NEUROPATHY 12/13/2009  . GOUT 02/15/2009  . SLEEP DISORDER 02/15/2009  . GERD 02/23/2008  . Hyperlipidemia 05/14/2007  . ANEMIA, CHRONIC 05/14/2007  . Essential hypertension 05/14/2007      Prior to Admission medications   Medication Sig Start Date End Date Taking? Authorizing Provider  acetaminophen (TYLENOL) 650 MG CR tablet Take 650 mg by mouth every 8 (eight) hours as needed for pain.   Yes [provider]  allopurinol (ZYLOPRIM) 100 MG tablet Take 200 tablets by mouth at bedtime.    Yes [provider]  amitriptyline (ELAVIL) 25 MG tablet Take 25 mg by mouth at bedtime as needed (foot pain).   Yes [provider]  amLODipine (NORVASC) 10 MG tablet Take 10 mg by mouth daily. 11/03/16  Yes [provider]  aspirin EC 81 MG tablet Take 1 tablet (81 mg total) by mouth daily. 06/18/16  Yes Fritzi Mandes, MD  cinacalcet (SENSIPAR) 90 MG tablet Take 90 mg by mouth daily. With lunch   Yes [provider]  cloNIDine (CATAPRES) 0.1 MG tablet Take 0.1 mg by mouth See admin instructions. tk 1 tab po bid on sun, mon, wed, and fri, then tk 1 tab qhs on tues, thurs, and sat 11/08/16  Yes [provider]  clopidogrel (PLAVIX) 75 MG tablet Take 1 tablet (75 mg total) by mouth daily. 01/26/17  Yes Gouru, Illene Silver, MD  colchicine 0.6 MG tablet Take 0.5 tablets (0.3 mg total) by mouth 2 (two) times a week. Patient taking differently: Take 0.6 mg by mouth 2 (two) times daily.  07/09/16  Yes Demetrios Loll, MD  dextromethorphan-guaiFENesin [facility] DM) 30-600 MG 12hr tablet Take 1 tablet by mouth 2 (two) times daily.   Yes [provider]  diclofenac sodium (VOLTAREN) 1 % GEL Apply 2 g four times a day PRN Not a candidate for oral NSAIDS, ESRD on PD 06/27/16  Yes [provider]  docusate sodium (COLACE) 100 MG capsule Take 1 capsule (100 mg total) by mouth daily as needed for mild constipation. 12/13/16  Yes Wieting, Richard, MD  famotidine (PEPCID) 20 MG tablet Take 20 mg by mouth at bedtime.  11/29/16  Yes [provider]  ferrous sulfate 325 (65 FE) MG tablet Take 1 tablet (325 mg total) by mouth daily with breakfast. 06/18/16  Yes Fritzi Mandes, MD  fluticasone (FLONASE) 50 MCG/ACT nasal spray Place 2 sprays into both nostrils daily.    Yes [provider]  furosemide (LASIX) 40 MG tablet Take 1 tablet (40 mg total) by mouth daily. Patient taking differently: Take 40 mg by mouth daily. Tk 1 tab qd on Sunday, Monday, Wednesday, and Friday 07/05/16  Yes  Demetrios Loll, MD  gabapentin (NEURONTIN) 100 MG capsule Take 1 capsule (100 mg total) by mouth at bedtime. 07/05/16  Yes Demetrios Loll, MD  hydrALAZINE (APRESOLINE) 50 MG tablet Take 50 mg by mouth See admin instructions. Take 1 tab twice daily on Sunday, Monday, Wednesday and Friday. Take 1 tab at bedtime(on dialysis days) Tuesday, Thursday, and saturday 11/29/16  Yes [provider]  insulin aspart (NOVOLOG) 100 UNIT/ML injection Inject 0-5 Units into the skin at bedtime. 05/28/16  Yes Fritzi Mandes, MD  ipratropium (ATROVENT) 0.03 % nasal spray Place 1 spray into both nostrils 3 (three) times daily.    Yes [provider]  ipratropium-albuterol (  DUONEB) 0.5-2.5 (3) MG/3ML SOLN Take 3 mLs by nebulization every 6 (six) hours as needed. 12/14/16  Yes Gladstone Lighter, MD  isosorbide mononitrate (IMDUR) 60 MG 24 hr tablet Take 30 mg by mouth 2 (two) times daily. 11/03/16  Yes [provider]  latanoprost (XALATAN) 0.005 % ophthalmic solution Place 1 drop into both eyes at bedtime. 03/20/16  Yes [provider]  metoprolol succinate (TOPROL-XL) 25 MG 24 hr tablet Take 1 tablet (25 mg total) by mouth at bedtime. 12/13/16  Yes Wieting, Richard, MD  multivitamin (RENA-VIT) TABS tablet Take 1 tablet by mouth daily.   Yes [provider]  pantoprazole (PROTONIX) 40 MG tablet Take 1 tablet (40 mg total) by mouth daily. 06/18/16  Yes Fritzi Mandes, MD  pravastatin (PRAVACHOL) 40 MG tablet Take 40 mg by mouth at bedtime.  06/21/15  Yes [provider]  feeding supplement, ENSURE ENLIVE, (ENSURE ENLIVE) LIQD Take 237 mLs by mouth 2 (two) times daily between meals. 01/25/17   Max Sane, MD  glimepiride (AMARYL) 1 MG tablet Take 1 mg by mouth daily with breakfast.    [provider]  ondansetron (ZOFRAN ODT) 4 MG disintegrating tablet Take 1 tablet (4 mg total) by mouth every 8 (eight) hours as needed for nausea or vomiting. Patient not taking: Reported on 07/11/2017  04/04/17   Darel Hong, MD    Allergies Alprazolam    Social History Social History  Substance Use Topics  . Smoking status: Former Smoker    Quit date: 11/05/1990  . Smokeless tobacco: Never Used  . Alcohol use No    Review of Systems Patient denies headaches, rhinorrhea, blurry vision, numbness, shortness of breath, chest pain, edema, cough, abdominal pain, nausea, vomiting, diarrhea, dysuria, fevers, rashes or hallucinations unless otherwise stated above in HPI. ____________________________________________   PHYSICAL EXAM:  VITAL SIGNS: Vitals:   07/11/17 1330 07/11/17 1400  BP: (!) 153/54 (!) 155/73  Pulse:  69  Resp: (!) 22 (!) 21  Temp:    SpO2:  100%    Constitutional: Alert and oriented. Well appearing and in no acute distress. Eyes: Conjunctivae are normal.  Head: Atraumatic. Nose: No congestion/rhinnorhea. Mouth/Throat: Mucous membranes are moist.   Neck: No stridor. Painless ROM.  Cardiovascular: Normal rate, regular rhythm. Grossly normal heart sounds.  Good peripheral circulation.  LEFt AV fistula with palpable bruit Respiratory: Normal respiratory effort.  No retractions. Lungs CTAB. Gastrointestinal: Soft and nontender. No distention. No abdominal bruits. No CVA tenderness. Musculoskeletal: No lower extremity tenderness nor edema.  No joint effusions. Neurologic:  Normal speech and language. No gross focal neurologic deficits are appreciated. No facial droop Skin:  Skin is warm, dry and intact. No rash noted. Psychiatric: Mood and affect are normal. Speech and behavior are normal.  ____________________________________________   LABS (all labs ordered are listed, but only abnormal results are displayed)  Results for orders placed or performed during the hospital encounter of 07/11/17 (from the past 24 hour(s))  Comprehensive metabolic panel     Status: Abnormal   Collection Time: 07/11/17 11:57 AM  Result Value Ref Range   Sodium 141 135 - 145  mmol/L   Potassium 3.4 (L) 3.5 - 5.1 mmol/L   Chloride 98 (L) 101 - 111 mmol/L   CO2 31 22 - 32 mmol/L   Glucose, Bld 154 (H) 65 - 99 mg/dL   BUN 52 (H) 6 - 20 mg/dL   Creatinine, Ser 5.82 (H) 0.44 - 1.00 mg/dL   Calcium 7.8 (L)  8.9 - 10.3 mg/dL   Total Protein 6.3 (L) 6.5 - 8.1 g/dL   Albumin 3.4 (L) 3.5 - 5.0 g/dL   AST 23 15 - 41 U/L   ALT 33 14 - 54 U/L   Alkaline Phosphatase 123 38 - 126 U/L   Total Bilirubin 0.6 0.3 - 1.2 mg/dL   GFR calc non Af Amer 6 (L) >60 mL/min   GFR calc Af Amer 7 (L) >60 mL/min   Anion gap 12 5 - 15  Ethanol     Status: None   Collection Time: 07/11/17 11:57 AM  Result Value Ref Range   Alcohol, Ethyl (B) <5 <5 mg/dL  CBC with Differential     Status: Abnormal   Collection Time: 07/11/17 11:57 AM  Result Value Ref Range   WBC 7.2 3.6 - 11.0 K/uL   RBC 3.25 (L) 3.80 - 5.20 MIL/uL   Hemoglobin 11.3 (L) 12.0 - 16.0 g/dL   HCT 34.4 (L) 35.0 - 47.0 %   MCV 105.9 (H) 80.0 - 100.0 fL   MCH 34.9 (H) 26.0 - 34.0 pg   MCHC 33.0 32.0 - 36.0 g/dL   RDW 18.1 (H) 11.5 - 14.5 %   Platelets 144 (L) 150 - 440 K/uL   Neutrophils Relative % 63 %   Neutro Abs 4.6 1.4 - 6.5 K/uL   Lymphocytes Relative 21 %   Lymphs Abs 1.5 1.0 - 3.6 K/uL   Monocytes Relative 12 %   Monocytes Absolute 0.9 0.2 - 0.9 K/uL   Eosinophils Relative 3 %   Eosinophils Absolute 0.2 0 - 0.7 K/uL   Basophils Relative 1 %   Basophils Absolute 0.1 0 - 0.1 K/uL  Troponin I     Status: Abnormal   Collection Time: 07/11/17 11:57 AM  Result Value Ref Range   Troponin I 0.06 (HH) <0.03 ng/mL  Troponin I     Status: Abnormal   Collection Time: 07/11/17  2:42 PM  Result Value Ref Range   Troponin I 0.06 (HH) <0.03 ng/mL   ____________________________________________  EKG My review and personal interpretation at Time: 11:26   Indication: palpitations  Rate: 60  Rhythm: sinus  Axis: left Other: occasional pvc, no stemi, poor r wave  progression ____________________________________________  RADIOLOGY  I personally reviewed all radiographic images ordered to evaluate for the above acute complaints and reviewed radiology reports and findings.  These findings were personally discussed with the patient.  Please see medical record for radiology report.  ____________________________________________   PROCEDURES  Procedure(s) performed:  Procedures    Critical Care performed: no ____________________________________________   INITIAL IMPRESSION / ASSESSMENT AND PLAN / ED COURSE  Pertinent labs & imaging results that were available during my care of the patient were reviewed by me and considered in my medical decision making (see chart for details).  DDX: dysrhythmia, chf, acs, electroyle abn, chf  Laurie Steele is a 76 y.o. who presents to the ED with irregular heartbeat. Patient is without any chest pain or discomfort at this time. No shortness of breath. Hadn't pulse measured at 30s upon arrival but this is likely due to abnormality picked up on the monitoring as she was completely asymptomatic during this period. EKG shows no evidence of acute ischemia. I do believe the irregular heartbeat that they were concerned about a secondary to occasional PVCs. She's not having any runs of V. tach on the monitor currently. Her initial troponin is elevated to 0.06 but in the setting of her  ESRD the significance is uncertain. Particularly in the case wishes not having any acute chest pain shortness of breath. We will further risk stratify with repeat troponin. We'll give her next dose of metoprolol to help suppress the PVCs.  Clinical Course as of Jul 12 1551  Thu Jul 11, 2017  1548 repeat troponin remains stable. Patient has been observed on the monitor and is still having occasional PVCs but no prolonged runs and remains hemodynamic stable. See that they were suppressed by her metoprolol. This point due to the patient is  appropriate for follow-up with cardiology.  [PR]    Clinical Course User Index [PR] Merlyn Lot, MD     ____________________________________________   FINAL CLINICAL IMPRESSION(S) / ED DIAGNOSES  Final diagnoses:  PVC (premature ventricular contraction)  ESRD (end stage renal disease) on dialysis [facility])  Palpitations      NEW MEDICATIONS STARTED DURING THIS VISIT:  New Prescriptions   No medications on file     Note:  This document was prepared using Dragon voice recognition software and may include unintentional dictation errors.    Merlyn Lot, MD 07/11/17 [phone]

## 2017-07-11 NOTE — ED Triage Notes (Signed)
Pt ems from home for weakness and irregular HR. Weakness started 1-2 weeks ago. Home health nurse found hr to be irregular and called EMS.  Pt MWF dialysis pt and says that she has not missed any treatment. IDDM - BS 178. 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

## 2017-07-18 ENCOUNTER — Ambulatory Visit (INDEPENDENT_AMBULATORY_CARE_PROVIDER_SITE_OTHER): Payer: 59

## 2017-07-18 ENCOUNTER — Ambulatory Visit (INDEPENDENT_AMBULATORY_CARE_PROVIDER_SITE_OTHER): Payer: 59 | Admitting: Vascular Surgery

## 2017-07-18 ENCOUNTER — Encounter (INDEPENDENT_AMBULATORY_CARE_PROVIDER_SITE_OTHER): Payer: Self-pay | Admitting: Vascular Surgery

## 2017-07-18 VITALS — BP 149/69 | HR 62 | Resp 16 | Wt 178.0 lb

## 2017-07-18 DIAGNOSIS — T829XXS Unspecified complication of cardiac and vascular prosthetic device, implant and graft, sequela: Secondary | ICD-10-CM

## 2017-07-18 DIAGNOSIS — I10 Essential (primary) hypertension: Secondary | ICD-10-CM | POA: Diagnosis not present

## 2017-07-18 DIAGNOSIS — N186 End stage renal disease: Secondary | ICD-10-CM

## 2017-07-18 DIAGNOSIS — E118 Type 2 diabetes mellitus with unspecified complications: Secondary | ICD-10-CM | POA: Diagnosis not present

## 2017-07-18 DIAGNOSIS — T829XXA Unspecified complication of cardiac and vascular prosthetic device, implant and graft, initial encounter: Secondary | ICD-10-CM | POA: Insufficient documentation

## 2017-07-18 DIAGNOSIS — K219 Gastro-esophageal reflux disease without esophagitis: Secondary | ICD-10-CM

## 2017-07-18 NOTE — Progress Notes (Signed)
MRN : [id number]  Laurie Steele is a 76 y.o. (1941-07-27) female who presents with chief complaint of  Chief Complaint  Patient presents with  . Follow-up    86month HDA  .  History of Present Illness: The patient returns to the office for followup of their dialysis access. The function of the access has been stable. The patient denies increased bleeding time or increased recirculation. Patient denies difficulty with cannulation. The patient denies hand pain or other symptoms consistent with steal phenomena.  No significant arm swelling.  The patient denies redness or swelling at the access site. The patient denies fever or chills at home or while on dialysis.  The patient denies amaurosis fugax or recent TIA symptoms. There are no recent neurological changes noted. The patient denies claudication symptoms or rest pain symptoms. The patient denies history of DVT, PE or superficial thrombophlebitis. The patient denies recent episodes of angina or shortness of breath.        Current Meds  Medication Sig  . acetaminophen (TYLENOL) 650 MG CR tablet Take 650 mg by mouth every 8 (eight) hours as needed for pain.  Marland Kitchen allopurinol (ZYLOPRIM) 100 MG tablet Take 200 tablets by mouth at bedtime.   Marland Kitchen amitriptyline (ELAVIL) 25 MG tablet Take 25 mg by mouth at bedtime as needed (foot pain).  Marland Kitchen amLODipine (NORVASC) 10 MG tablet Take 10 mg by mouth daily.  Marland Kitchen aspirin EC 81 MG tablet Take 1 tablet (81 mg total) by mouth daily.  . cinacalcet (SENSIPAR) 90 MG tablet Take 90 mg by mouth daily. With lunch  . cloNIDine (CATAPRES) 0.1 MG tablet Take 0.1 mg by mouth See admin instructions. tk 1 tab po bid on sun, mon, wed, and fri, then tk 1 tab qhs on tues, thurs, and sat  . clopidogrel (PLAVIX) 75 MG tablet Take 1 tablet (75 mg total) by mouth daily.  . colchicine 0.6 MG tablet Take 0.5 tablets (0.3 mg total) by mouth 2 (two) times a week. (Patient taking differently: Take 0.6 mg by mouth 2 (two) times  daily. )  . dextromethorphan-guaiFENesin (MUCINEX DM) 30-600 MG 12hr tablet Take 1 tablet by mouth 2 (two) times daily.  . diclofenac sodium (VOLTAREN) 1 % GEL Apply 2 g four times a day PRN Not a candidate for oral NSAIDS, ESRD on PD  . docusate sodium (COLACE) 100 MG capsule Take 1 capsule (100 mg total) by mouth daily as needed for mild constipation.  . famotidine (PEPCID) 20 MG tablet Take 20 mg by mouth at bedtime.   . feeding supplement, ENSURE ENLIVE, (ENSURE ENLIVE) LIQD Take 237 mLs by mouth 2 (two) times daily between meals.  . ferrous sulfate 325 (65 FE) MG tablet Take 1 tablet (325 mg total) by mouth daily with breakfast.  . fluticasone (FLONASE) 50 MCG/ACT nasal spray Place 2 sprays into both nostrils daily.   . furosemide (LASIX) 40 MG tablet Take 1 tablet (40 mg total) by mouth daily. (Patient taking differently: Take 40 mg by mouth daily. Tk 1 tab qd on Sunday, Monday, Wednesday, and Friday)  . gabapentin (NEURONTIN) 100 MG capsule Take 1 capsule (100 mg total) by mouth at bedtime.  Marland Kitchen glimepiride (AMARYL) 1 MG tablet Take 1 mg by mouth daily with breakfast.  . hydrALAZINE (APRESOLINE) 50 MG tablet Take 50 mg by mouth See admin instructions. Take 1 tab twice daily on Sunday, Monday, Wednesday and Friday. Take 1 tab at bedtime(on dialysis days) Tuesday, Thursday, and saturday  .  insulin aspart (NOVOLOG) 100 UNIT/ML injection Inject 0-5 Units into the skin at bedtime.  Marland Kitchen ipratropium (ATROVENT) 0.03 % nasal spray Place 1 spray into both nostrils 3 (three) times daily.   Marland Kitchen ipratropium-albuterol (DUONEB) 0.5-2.5 (3) MG/3ML SOLN Take 3 mLs by nebulization every 6 (six) hours as needed.  . isosorbide mononitrate (IMDUR) 60 MG 24 hr tablet Take 30 mg by mouth 2 (two) times daily.  Marland Kitchen latanoprost (XALATAN) 0.005 % ophthalmic solution Place 1 drop into both eyes at bedtime.  . metoprolol succinate (TOPROL-XL) 25 MG 24 hr tablet Take 1 tablet (25 mg total) by mouth at bedtime.  . multivitamin  (RENA-VIT) TABS tablet Take 1 tablet by mouth daily.  . pantoprazole (PROTONIX) 40 MG tablet Take 1 tablet (40 mg total) by mouth daily.  . pravastatin (PRAVACHOL) 40 MG tablet Take 40 mg by mouth at bedtime.     Past Medical History:  Diagnosis Date  . CAD (coronary artery disease)   . Chronic anemia    Dr Inez Pilgrim  . GERD (gastroesophageal reflux disease)   . Glaucoma   . Gout   . Hx of colonic polyps   . Hyperlipidemia   . Hypertension   . Lower back pain   . Miscarriage   . 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 Peritoneal dialysis status ([place])   . Renal failure    Dr Holley Raring  . Renal insufficiency   . 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 Vaginal delivery    x 4    Past Surgical History:  Procedure Laterality Date  . ABDOMINAL HYSTERECTOMY  1983  . AV FISTULA PLACEMENT Left 09/21/2016   Procedure: ARTERIOVENOUS (AV) FISTULA CREATION ( BRACHIOCEPHALIC );  Surgeon: Katha Cabal, MD;  Location: [facility];  Service: Vascular;  Laterality: Left;  . BACK SURGERY  6/08   Dr Collier Salina  . BACK SURGERY  1987   disc  . CHOLECYSTECTOMY    . DIALYSIS/PERMA CATHETER REMOVAL N/A 02/25/2017   Procedure: Dialysis/Perma Catheter Removal;  Surgeon: Algernon Huxley, MD;  Location: [place] [facility];  Service: Cardiovascular;  Laterality: N/A;  . INSERTION OF DIALYSIS CATHETER  2017  . PERITONEAL CATHETER INSERTION    . REMOVAL OF A DIALYSIS CATHETER N/A 09/21/2016   Procedure: REMOVAL OF A DIALYSIS CATHETER ( PERITONEAL DIALYSIS CATH );  Surgeon: Katha Cabal, MD;  Location: [facility];  Service: Vascular;  Laterality: N/A;    Social History Social History  Substance Use Topics  . Smoking status: Former Smoker    Quit date: 11/05/1990  . Smokeless tobacco: Never Used  . Alcohol use No    Family History Family History  Problem  Relation Age of Onset  . Heart attack Father   . Hypertension Mother   . Hyperlipidemia Mother   . Coronary artery disease Unknown        strong fam hx  . Kidney failure Brother   . Breast cancer Neg Hx     Allergies  Allergen Reactions  . Alprazolam Other (See Comments)    Unable to sleep and confusion     REVIEW OF SYSTEMS (Negative unless checked)  Constitutional: [] Weight loss  [] Fever  [] Chills Cardiac: [] Chest pain   [] Chest pressure   [] Palpitations   [] Shortness of breath when laying flat   [] Shortness of breath  with exertion. Vascular:  [] Pain in legs with walking   [] Pain in legs at rest  [] History of DVT   [] Phlebitis   [] Swelling in legs   [] Varicose veins   [] Non-healing ulcers Pulmonary:   [] Uses home oxygen   [] Productive cough   [] Hemoptysis   [] Wheeze  [] COPD   [] Asthma Neurologic:  [] Dizziness   [] Seizures   [x] History of stroke   [] History of TIA  [] Aphasia   [] Vissual changes   [] Weakness or numbness in arm   [x] Weakness or numbness in leg Musculoskeletal:   [] Joint swelling   [] Joint pain   [] Low back pain Hematologic:  [] Easy bruising  [] Easy bleeding   [] Hypercoagulable state   [] Anemic Gastrointestinal:  [] Diarrhea   [] Vomiting  [] Gastroesophageal reflux/heartburn   [] Difficulty swallowing. Genitourinary:  [x] Chronic kidney disease   [] Difficult urination  [] Frequent urination   [] Blood in urine Skin:  [] Rashes   [] Ulcers  Psychological:  [] History of anxiety   []  History of major depression.  Physical Examination  Vitals:   07/18/17 1517  BP: (!) 149/69  Pulse: 62  Resp: 16  Weight: 178 lb (80.7 kg)   Body mass index is 27.88 kg/m. Gen: WD/WN, NAD Head: [place]/AT, No temporalis wasting.  Ear/Nose/Throat: Hearing grossly intact, nares w/o erythema or drainage Eyes: PER, EOMI, sclera nonicteric.  Neck: Supple, no large masses.   Pulmonary:  Good air movement, no audible wheezing bilaterally, no use of accessory muscles.  Cardiac: RRR, no  JVD Vascular:   Left brachial cephalic fistula good thrill good bruit Vessel Right Left  Radial Palpable Palpable  Ulnar Palpable Palpable  Brachial Palpable Palpable  Gastrointestinal: Non-distended. No guarding/no peritoneal signs.  Musculoskeletal: M/S 5/5 throughout left side 3-4/5 right arm and leg.  No deformity or atrophy.  Neurologic: CN 2-12 intact. Symmetrical.  Speech is fluent. Motor exam as listed above. Psychiatric: Judgment intact, Mood & affect appropriate for pt's clinical situation. Dermatologic: No rashes or ulcers noted.  No changes consistent with cellulitis. Lymph : No lichenification or skin changes of chronic lymphedema.  CBC Lab Results  Component Value Date   WBC 7.2 07/11/2017   HGB 11.3 (L) 07/11/2017   HCT 34.4 (L) 07/11/2017   MCV 105.9 (H) 07/11/2017   PLT 144 (L) 07/11/2017    BMET    Component Value Date/Time   NA 141 07/11/2017 1157   NA 142 10/27/2014 0839   K 3.4 (L) 07/11/2017 1157   K 3.2 (L) 10/27/2014 0839   CL 98 (L) 07/11/2017 1157   CL 104 10/27/2014 0839   CO2 31 07/11/2017 1157   CO2 29 10/27/2014 0839   GLUCOSE 154 (H) 07/11/2017 1157   GLUCOSE 108 (H) 10/27/2014 0839   BUN 52 (H) 07/11/2017 1157   BUN 44 (H) 10/27/2014 0839   CREATININE 5.82 (H) 07/11/2017 1157   CREATININE 7.75 (H) 10/27/2014 0839   CALCIUM 7.8 (L) 07/11/2017 1157   CALCIUM 7.7 (L) 10/27/2014 0839   GFRNONAA 6 (L) 07/11/2017 1157   GFRNONAA 5 (L) 10/27/2014 0839   GFRNONAA 5 (L) 10/30/2013 0933   GFRAA 7 (L) 07/11/2017 1157   GFRAA 7 (L) 10/27/2014 0839   GFRAA 6 (L) 10/30/2013 0933   Estimated Creatinine Clearance: 8.8 mL/min (A) (by C-G formula based on SCr of 5.82 mg/dL (H)).  COAG Lab Results  Component Value Date   INR 1.02 01/22/2017   INR 0.92 09/19/2016   INR 1.07 06/16/2016    Radiology Dg Chest Portable 1 View  Result  Date: 07/11/2017 CLINICAL DATA:  Weakness. EXAM: PORTABLE CHEST 1 VIEW COMPARISON:  Apr 04, 2017. FINDINGS: Stable  cardiomegaly. Normal pulmonary vascularity. Atherosclerotic calcification of the aortic arch. No focal consolidation, pleural effusion, or pneumothorax. No acute osseous abnormality. IMPRESSION: Stable cardiomegaly.  No active cardiopulmonary disease. Electronically Signed   By: Titus Dubin M.D.   On: 07/11/2017 11:57    Assessment/Plan 1. END STAGE RENAL DISEASE Continue dialysis without interruption  2. Complication of vascular access for dialysis, sequela Recommend:  The patient is doing well and currently has adequate dialysis access. No intervention at this time.  However, she has a moderate stricture of the fistula which should be followed.    The patient's dialysis center is not reporting any access issues. Flow pattern is stable when compared to the prior ultrasound.  The patient should have a duplex ultrasound of the dialysis access in 6 months.  The patient will follow-up with me in the office after each ultrasound   - VAS [place] (AVF,AVG); Future  3. Type 2 diabetes mellitus with complication, unspecified whether long term insulin use (HCC) Continue hypoglycemic medications as already ordered, these medications have been reviewed and there are no changes at this time.  Hgb A1C to be monitored as already arranged by primary service   4. Essential hypertension Continue antihypertensive medications as already ordered, these medications have been reviewed and there are no changes at this time.   5. Gastroesophageal reflux disease without esophagitis Continue PPI as already ordered, these medications have been reviewed and there are no changes at this time.     Hortencia Pilar, MD  07/18/2017 3:36 PM

## 2017-07-22 ENCOUNTER — Ambulatory Visit (INDEPENDENT_AMBULATORY_CARE_PROVIDER_SITE_OTHER): Payer: 59 | Admitting: Vascular Surgery

## 2017-07-22 ENCOUNTER — Encounter (INDEPENDENT_AMBULATORY_CARE_PROVIDER_SITE_OTHER): Payer: Self-pay

## 2017-07-22 ENCOUNTER — Encounter (INDEPENDENT_AMBULATORY_CARE_PROVIDER_SITE_OTHER): Payer: 59

## 2017-10-28 ENCOUNTER — Encounter: Payer: Self-pay | Admitting: *Deleted

## 2017-11-04 ENCOUNTER — Encounter
Admission: RE | Disposition: A | Discharge status: Home / Self Care | Payer: Self-pay | Source: Ambulatory Visit | Attending: Ophthalmology

## 2017-11-04 ENCOUNTER — Ambulatory Visit: Payer: Medicare Other | Admitting: Anesthesiology

## 2017-11-04 ENCOUNTER — Other Ambulatory Visit: Payer: Self-pay

## 2017-11-04 ENCOUNTER — Ambulatory Visit
Admission: RE | Admit: 2017-11-04 | Discharge: 2017-11-04 | Disposition: A | Discharge status: Home / Self Care | Payer: Medicare Other | Source: Ambulatory Visit | Attending: Ophthalmology | Admitting: Ophthalmology

## 2017-11-04 ENCOUNTER — Encounter: Payer: Self-pay | Admitting: Anesthesiology

## 2017-11-04 DIAGNOSIS — E1136 Type 2 diabetes mellitus with diabetic cataract: Secondary | ICD-10-CM | POA: Insufficient documentation

## 2017-11-04 DIAGNOSIS — F419 Anxiety disorder, unspecified: Secondary | ICD-10-CM | POA: Insufficient documentation

## 2017-11-04 DIAGNOSIS — D649 Anemia, unspecified: Secondary | ICD-10-CM | POA: Diagnosis not present

## 2017-11-04 DIAGNOSIS — I252 Old myocardial infarction: Secondary | ICD-10-CM | POA: Diagnosis not present

## 2017-11-04 DIAGNOSIS — F909 Attention-deficit hyperactivity disorder, unspecified type: Secondary | ICD-10-CM | POA: Diagnosis not present

## 2017-11-04 DIAGNOSIS — I509 Heart failure, unspecified: Secondary | ICD-10-CM | POA: Insufficient documentation

## 2017-11-04 DIAGNOSIS — I110 Hypertensive heart disease with heart failure: Secondary | ICD-10-CM | POA: Insufficient documentation

## 2017-11-04 DIAGNOSIS — I2510 Atherosclerotic heart disease of native coronary artery without angina pectoris: Secondary | ICD-10-CM | POA: Diagnosis not present

## 2017-11-04 DIAGNOSIS — M109 Gout, unspecified: Secondary | ICD-10-CM | POA: Diagnosis not present

## 2017-11-04 DIAGNOSIS — K219 Gastro-esophageal reflux disease without esophagitis: Secondary | ICD-10-CM | POA: Insufficient documentation

## 2017-11-04 DIAGNOSIS — Z79899 Other long term (current) drug therapy: Secondary | ICD-10-CM | POA: Insufficient documentation

## 2017-11-04 HISTORY — PX: VITRECTOMY AND CATARACT: SHX6184

## 2017-11-04 HISTORY — DX: Cardiac arrhythmia, unspecified: I49.9

## 2017-11-04 HISTORY — DX: Gout, unspecified: M10.9

## 2017-11-04 HISTORY — DX: Acute myocardial infarction, unspecified: I21.9

## 2017-11-04 HISTORY — DX: Polyneuropathy, unspecified: G62.9

## 2017-11-04 HISTORY — DX: Anxiety disorder, unspecified: F41.9

## 2017-11-04 LAB — POCT I-STAT 4, (NA,K, GLUC, HGB,HCT)
Glucose, Bld: 83 mg/dL (ref 65–99)
HCT: 36 % (ref 36.0–46.0)
Hemoglobin: 12.2 g/dL (ref 12.0–15.0)
POTASSIUM: 4 mmol/L (ref 3.5–5.1)
SODIUM: 140 mmol/L (ref 135–145)

## 2017-11-04 SURGERY — EXTRACTION, CATARACT, WITH VITRECTOMY
Anesthesia: Monitor Anesthesia Care | Site: Eye | Laterality: Left | Wound class: Clean

## 2017-11-04 MED ORDER — MIDAZOLAM HCL 2 MG/2ML IJ SOLN
INTRAMUSCULAR | Status: AC
  Filled 2017-11-04: qty 2

## 2017-11-04 MED ORDER — EPINEPHRINE PF 1 MG/ML IJ SOLN
INTRAMUSCULAR | Status: DC | PRN
  Administered 2017-11-04: 200 mL via OPHTHALMIC

## 2017-11-04 MED ORDER — EPINEPHRINE PF 1 MG/ML IJ SOLN
INTRAMUSCULAR | Status: AC
  Filled 2017-11-04: qty 1

## 2017-11-04 MED ORDER — LIDOCAINE HCL (PF) 4 % IJ SOLN
INTRAOCULAR | Status: DC | PRN
  Administered 2017-11-04: 4 mL via OPHTHALMIC

## 2017-11-04 MED ORDER — NEOMYCIN-POLYMYXIN-DEXAMETH 0.1 % OP OINT
TOPICAL_OINTMENT | OPHTHALMIC | Status: DC | PRN
  Administered 2017-11-04: 1 via OPHTHALMIC

## 2017-11-04 MED ORDER — ARMC OPHTHALMIC DILATING DROPS
1.0000 "application " | OPHTHALMIC | Status: AC
  Administered 2017-11-04 (×3): 1 via OPHTHALMIC

## 2017-11-04 MED ORDER — MOXIFLOXACIN HCL 0.5 % OP SOLN
OPHTHALMIC | Status: AC
  Administered 2017-11-04: 1 [drp] via OPHTHALMIC
  Filled 2017-11-04: qty 3

## 2017-11-04 MED ORDER — NA HYALUR & NA CHOND-NA HYALUR 0.55-0.5 ML IO KIT
PACK | INTRAOCULAR | Status: AC
  Filled 2017-11-04: qty 1.05

## 2017-11-04 MED ORDER — FENTANYL CITRATE (PF) 100 MCG/2ML IJ SOLN
INTRAMUSCULAR | Status: AC
  Filled 2017-11-04: qty 2

## 2017-11-04 MED ORDER — POVIDONE-IODINE 5 % OP SOLN
OPHTHALMIC | Status: DC | PRN
  Administered 2017-11-04: 1 via OPHTHALMIC

## 2017-11-04 MED ORDER — TRYPAN BLUE 0.06 % OP SOLN
OPHTHALMIC | Status: DC | PRN
  Administered 2017-11-04: 0.5 mL via INTRAOCULAR

## 2017-11-04 MED ORDER — TRYPAN BLUE 0.06 % OP SOLN
OPHTHALMIC | Status: AC
  Filled 2017-11-04: qty 0.5

## 2017-11-04 MED ORDER — NA HYALUR & NA CHOND-NA HYALUR 0.4-0.35 ML IO KIT
PACK | INTRAOCULAR | Status: DC | PRN
  Administered 2017-11-04: .35 mL via INTRAOCULAR

## 2017-11-04 MED ORDER — LIDOCAINE HCL (PF) 4 % IJ SOLN
INTRAMUSCULAR | Status: AC
Start: 2017-11-04 — End: ?
  Filled 2017-11-04: qty 5

## 2017-11-04 MED ORDER — CARBACHOL 0.01 % IO SOLN
INTRAOCULAR | Status: DC | PRN
  Administered 2017-11-04: 0.5 mL via INTRAOCULAR

## 2017-11-04 MED ORDER — POVIDONE-IODINE 5 % OP SOLN
OPHTHALMIC | Status: AC
  Filled 2017-11-04: qty 30

## 2017-11-04 MED ORDER — SODIUM HYALURONATE 23 MG/ML IO SOLN
INTRAOCULAR | Status: DC | PRN
  Administered 2017-11-04: 0.6 mL via INTRAOCULAR

## 2017-11-04 MED ORDER — ARMC OPHTHALMIC DILATING DROPS
OPHTHALMIC | Status: AC
  Administered 2017-11-04: 1 via OPHTHALMIC
  Filled 2017-11-04: qty 0.4

## 2017-11-04 MED ORDER — SODIUM CHLORIDE 0.9 % IV SOLN
INTRAVENOUS | Status: DC
  Administered 2017-11-04: 09:00:00 via INTRAVENOUS

## 2017-11-04 MED ORDER — FENTANYL CITRATE (PF) 100 MCG/2ML IJ SOLN
INTRAMUSCULAR | Status: DC | PRN
  Administered 2017-11-04 (×2): 25 ug via INTRAVENOUS

## 2017-11-04 MED ORDER — MOXIFLOXACIN HCL 0.5 % OP SOLN
1.0000 [drp] | OPHTHALMIC | Status: DC | PRN
  Administered 2017-11-04 (×3): 1 [drp] via OPHTHALMIC

## 2017-11-04 SURGICAL SUPPLY — 27 items
CANNULA ANT/CHMB 27GA (MISCELLANEOUS) ×3 IMPLANT
GLIDE IOL SHEET 35MLX5ML (MISCELLANEOUS) IMPLANT
GLOVE BIO SURGEON STRL SZ8 (GLOVE) ×3 IMPLANT
GLOVE BIOGEL M 6.5 STRL (GLOVE) ×3 IMPLANT
GLOVE SURG LX 7.5 STRW (GLOVE) ×1
GLOVE SURG LX STRL 7.5 STRW (GLOVE) ×2 IMPLANT
GOWN STRL REUS W/ TWL LRG LVL3 (GOWN DISPOSABLE) ×4 IMPLANT
GOWN STRL REUS W/TWL LRG LVL3 (GOWN DISPOSABLE) ×6
LABEL CATARACT MEDS ST (LABEL) ×3 IMPLANT
LENS IOL +16.5 DIOP 13X5.5X (Intraocular Lens) ×2 IMPLANT
LENS IOL ACRSF MP 20.5 (Intraocular Lens) IMPLANT
LENS IOL ACRYSOF POST 20.5 (Intraocular Lens) IMPLANT
LENS IOL KELMAN MF 16.5 (Intraocular Lens) ×2 IMPLANT
LENS IOL KELMAN MULTIFLEX 16.5 (Intraocular Lens) ×3 IMPLANT
LENS IOL TECNIS ITEC 21.0 (Intraocular Lens) IMPLANT
PACK CATARACT (MISCELLANEOUS) ×3 IMPLANT
PACK CATARACT BRASINGTON LX (MISCELLANEOUS) ×3 IMPLANT
PACK EYE AFTER SURG (MISCELLANEOUS) ×3 IMPLANT
PACK VIT ANT 23G (MISCELLANEOUS) ×3 IMPLANT
SOL BSS BAG (MISCELLANEOUS) ×3
SOLUTION BSS BAG (MISCELLANEOUS) ×2 IMPLANT
SPEAR PVA EYE SURG (MISCELLANEOUS) ×3 IMPLANT
SUT ETHILON 10 0 CS140 6 (SUTURE) ×3 IMPLANT
SYR 3ML LL SCALE MARK (SYRINGE) ×3 IMPLANT
SYR 5ML LL (SYRINGE) ×3 IMPLANT
WATER STERILE IRR 250ML POUR (IV SOLUTION) ×3 IMPLANT
WIPE NON LINTING 3.25X3.25 (MISCELLANEOUS) ×3 IMPLANT

## 2017-11-04 NOTE — Anesthesia Post-op Follow-up Note (Signed)
Anesthesia QCDR form completed.        

## 2017-11-04 NOTE — Op Note (Signed)
OPERATIVE NOTE  WALDA HERTZOG [id number] 11/04/2017   PREOPERATIVE DIAGNOSIS:  NUCLEAR SCLEROTIC CATARACT LEFT          Mature (Total) Cataract Left Eye H25.89   POSTOPERATIVE DIAGNOSIS: Mature NUCLEAR SCLEROTIC CATARACT LEFT          PROCEDURE:  Phacoemusification with posterior chamber intraocular lens placement of the right eye .  Vision Blue dye was used to stain the lens capsule.  LENS:   Implant Name Type Inv. Item Serial No. Manufacturer Lot No. LRB No. Used  LENS IOL DIOP 21.0 - [id number] Intraocular Lens LENS IOL DIOP 21.0 [id number]  Left 1  LENS IOL POST 20.5 - [id number] Intraocular Lens LENS IOL POST 20.5 [id number] ALCON [id number] Left 1  LENS IOL ANT DIOP 16.5 - [id number] Intraocular Lens LENS IOL ANT DIOP 16.5 [id number] ALCON  Left 1      The 3rd lens (ACIOL) 16.5 D  was used .  The other 2 were discarded.  ULTRASOUND TIME: 22 of 0 minutes 52 seconds, CDE 11.2  SURGEON:  Wyonia Hough, MD   ANESTHESIA:  Topical with tetracaine drops and 2% Xylocaine jelly, augmented with 1% preservative-free intracameral lidocaine.   COMPLICATIONS:  Posterior capsule tear ([place] flag)   DESCRIPTION OF PROCEDURE:  The patient was identified in the holding room and transported to the operating room and placed in the supine position under the operating microscope. Theleft eye was identified as the operative eye and it was prepped and draped in the usual sterile ophthalmic fashion.  A 1 millimeter clear-corneal paracentesis was made at the 1:30 position.  0.5 ml of preservative-free 1% lidocaine was injected into the anterior chamber. The anterior chamber was filled with Healon 5 viscoelastic.  A 2.4 millimeter keratome was used to make a near-clear corneal incision at the 10:30 position.  The anterior chamber was filled with Healon 5 viscoelastic.  Vision Blue dye was then injected under the viscoelastic to stain the lens capsule.  BSS was then  used to wash the dye out.  Additional Healon 5 was placed into the anterior chamber.  A curvilinear capsulorrhexis was attempted with a cystotome and the capsule opened linearly ([place] flag sign).  Balanced salt solution was used to hydrodissect and hydrodelineate the nucleus.  Viscoat was then placed in the anterior chamber.  The lens was elevated into the anterior chamber with Healon 5 and Viscoat to perform anterior chamber phacoemulsification.   Phacoemulsification was then used to remove the lens nucleus and epinucleus. During phaco of the last quadrant, a tear was noted in the posterior capsule.  Additional viscoelastic was placed into the anterior chamber and the instruments were removed.  The paracentesis was enlarged  Wounds were checked with Weck cells and noted to be free of incarcerated vitreous strands.  The ciliary sulcus was dilated with Provisc viscoelastic.  There was not sufficient anterior capsule support for a PCIOL. The 2.4 mm corneal wound was enlarged to approximately 6 mm for intraocular lens placement.  An anterior chamber lens was placed in the eye and haptics well positioned. The bimanual vitrector was used to aspirate the viscoelastic.  An iridotomy was made. Wounds were again checked to ensure no vitreous was present.  The lens was well centered.   Three 10-0 nylon sutures were placed through the incision.    Wounds were hydrated with balanced salt solution.  The anterior chamber was inflated to a physiologic pressure with balanced salt  solution. Vigamox 0.2 ml of a 1mg  per ml solution was injected into the anterior chamber for a dose of 0.2 mg of intracameral antibiotic at the completion of the case. Miostat was placed into the anterior chamber to constrict the pupil.  No wound leaks were noted.  Topical Vigamox drops and Maxitrol ointment were applied to the eye.  The patient was taken to the recovery room in stable condition without complications of anesthesia or  surgery.  Alister Staver 11/04/2017, 9:22 AM

## 2017-11-04 NOTE — H&P (Signed)
The History and Physical notes are on paper, have been signed, and are to be scanned. The patient remains stable and unchanged from the H&P.   Previous H&P reviewed, patient examined, and there are no changes.  Laurie Steele 11/04/2017 8:13 AM

## 2017-11-04 NOTE — OR Nursing (Signed)
IV right hand removed without difficulty

## 2017-11-04 NOTE — Anesthesia Preprocedure Evaluation (Addendum)
Anesthesia Evaluation  Patient identified by MRN, date of birth, ID band Patient awake    Reviewed: Allergy & Precautions, NPO status , Patient's Chart, lab work & pertinent test results, reviewed documented beta blocker date and time   Airway Mallampati: III  TM Distance: >3 FB     Dental  (+) Chipped   Pulmonary pneumonia, resolved, former smoker,           Cardiovascular hypertension, Pt. on medications and Pt. on home beta blockers + CAD, + Past MI and +CHF  + dysrhythmias      Neuro/Psych Anxiety  Neuromuscular disease CVA, Residual Symptoms    GI/Hepatic GERD  Controlled,  Endo/Other  diabetes, Type 2  Renal/GU ESRFRenal disease     Musculoskeletal   Abdominal   Peds  (+) ADHD Hematology  (+) anemia ,   Anesthesia Other Findings Gout.L weakness. Uses walker. K 4.0.  Reproductive/Obstetrics                            Anesthesia Physical Anesthesia Plan  ASA: III  Anesthesia Plan: MAC   Post-op Pain Management:    Induction:   PONV Risk Score and Plan:   Airway Management Planned:   Additional Equipment:   Intra-op Plan:   Post-operative Plan:   Informed Consent: I have reviewed the patients History and Physical, chart, labs and discussed the procedure including the risks, benefits and alternatives for the proposed anesthesia with the patient or authorized representative who has indicated his/her understanding and acceptance.     Plan Discussed with: CRNA  Anesthesia Plan Comments:         Anesthesia Quick Evaluation

## 2017-11-04 NOTE — Anesthesia Postprocedure Evaluation (Signed)
Anesthesia Post Note  Patient: Laurie Steele  Procedure(s) Performed: VITRECTOMY AND CATARACT (Left Eye)  Patient location during evaluation: PACU Anesthesia Type: MAC Level of consciousness: awake, awake and alert and oriented Pain management: pain level controlled Vital Signs Assessment: post-procedure vital signs reviewed and stable Respiratory status: spontaneous breathing Cardiovascular status: blood pressure returned to baseline Postop Assessment: no apparent nausea or vomiting Anesthetic complications: no     Last Vitals:  Vitals:   11/04/17 0747 11/04/17 0923  BP: (!) 150/55 (!) 159/67  Pulse: (!) 59 69  Resp: 16   Temp: (!) 35.8 C 36.7 C  SpO2: 100% 100%    Last Pain:  Vitals:   11/04/17 0923  TempSrc: Oral                 Hedda Slade

## 2017-11-04 NOTE — Transfer of Care (Signed)
Immediate Anesthesia Transfer of Care Note  Patient: Laurie Steele  Procedure(s) Performed: VITRECTOMY AND CATARACT (Left Eye)  Patient Location: PACU  Anesthesia Type:MAC  Level of Consciousness: awake, alert  and oriented  Airway & Oxygen Therapy: Patient Spontanous Breathing  Post-op Assessment: Report given to RN and Post -op Vital signs reviewed and stable  Post vital signs: Reviewed and stable  Last Vitals:  Vitals:   11/04/17 0747 11/04/17 0923  BP: (!) 150/55 (!) 159/67  Pulse: (!) 59 69  Resp: 16   Temp: (!) 35.8 C 36.7 C  SpO2: 100% 100%    Last Pain:  Vitals:   11/04/17 0923  TempSrc: Oral         Complications: No apparent anesthesia complications

## 2017-11-04 NOTE — Discharge Instructions (Signed)

## 2018-01-16 ENCOUNTER — Ambulatory Visit (INDEPENDENT_AMBULATORY_CARE_PROVIDER_SITE_OTHER): Payer: 59 | Admitting: Vascular Surgery

## 2018-01-16 ENCOUNTER — Ambulatory Visit (INDEPENDENT_AMBULATORY_CARE_PROVIDER_SITE_OTHER): Payer: 59

## 2018-01-16 ENCOUNTER — Encounter (INDEPENDENT_AMBULATORY_CARE_PROVIDER_SITE_OTHER): Payer: Self-pay | Admitting: Vascular Surgery

## 2018-01-16 VITALS — BP 168/68 | HR 75 | Resp 16 | Wt 165.0 lb

## 2018-01-16 DIAGNOSIS — E118 Type 2 diabetes mellitus with unspecified complications: Secondary | ICD-10-CM | POA: Diagnosis not present

## 2018-01-16 DIAGNOSIS — K219 Gastro-esophageal reflux disease without esophagitis: Secondary | ICD-10-CM

## 2018-01-16 DIAGNOSIS — T829XXS Unspecified complication of cardiac and vascular prosthetic device, implant and graft, sequela: Secondary | ICD-10-CM

## 2018-01-16 DIAGNOSIS — N186 End stage renal disease: Secondary | ICD-10-CM

## 2018-01-16 DIAGNOSIS — I10 Essential (primary) hypertension: Secondary | ICD-10-CM | POA: Diagnosis not present

## 2018-01-17 ENCOUNTER — Encounter (INDEPENDENT_AMBULATORY_CARE_PROVIDER_SITE_OTHER): Payer: Self-pay | Admitting: Vascular Surgery

## 2018-01-17 NOTE — Progress Notes (Signed)
MRN : [id number]  Laurie Steele is a 77 y.o. (1941-03-07) female who presents with chief complaint of  Chief Complaint  Patient presents with  . Follow-up    86month HDA  .  History of Present Illness: The patient returns to the office for followup of their dialysis access. The function of the access has been stable. The patient denies increased bleeding time or increased recirculation. Patient denies difficulty with cannulation. The patient denies hand pain or other symptoms consistent with steal phenomena.  No significant arm swelling.  The patient denies redness or swelling at the access site. The patient denies fever or chills at home or while on dialysis.  The patient denies amaurosis fugax or recent TIA symptoms. There are no recent neurological changes noted. The patient denies claudication symptoms or rest pain symptoms. The patient denies history of DVT, PE or superficial thrombophlebitis. The patient denies recent episodes of angina or shortness of breath.   Duplex ultrasound of the AV access shows a patent access.  The previously noted stenosis is unchanged compared to last study.       Current Meds  Medication Sig  . acetaminophen (TYLENOL) 650 MG CR tablet Take 650 mg by mouth every 8 (eight) hours as needed for pain.  Marland Kitchen amitriptyline (ELAVIL) 25 MG tablet Take 25 mg by mouth at bedtime as needed (foot pain).  Marland Kitchen amLODipine (NORVASC) 10 MG tablet Take 10 mg by mouth daily.  Marland Kitchen aspirin EC 81 MG tablet Take 1 tablet (81 mg total) by mouth daily.  . cinacalcet (SENSIPAR) 90 MG tablet Take 90 mg by mouth daily. With lunch  . cloNIDine (CATAPRES) 0.1 MG tablet Take 0.1 mg by mouth See admin instructions. tk 1 tab po bid on sun, mon, wed, and fri, then tk 1 tab qhs on tues, thurs, and sat  . clopidogrel (PLAVIX) 75 MG tablet Take 1 tablet (75 mg total) by mouth daily.  . colchicine 0.6 MG tablet Take 0.5 tablets (0.3 mg total) by mouth 2 (two) times a week. (Patient taking  differently: Take 0.6 mg by mouth 2 (two) times daily. )  . dextromethorphan-guaiFENesin (MUCINEX DM) 30-600 MG 12hr tablet Take 1 tablet by mouth 2 (two) times daily.  . diclofenac sodium (VOLTAREN) 1 % GEL Apply 2 g four times a day PRN Not a candidate for oral NSAIDS, ESRD on PD  . docusate sodium (COLACE) 100 MG capsule Take 1 capsule (100 mg total) by mouth daily as needed for mild constipation.  . famotidine (PEPCID) 20 MG tablet Take 20 mg by mouth at bedtime.   . feeding supplement, ENSURE ENLIVE, (ENSURE ENLIVE) LIQD Take 237 mLs by mouth 2 (two) times daily between meals.  . ferrous sulfate 325 (65 FE) MG tablet Take 1 tablet (325 mg total) by mouth daily with breakfast.  . fluticasone (FLONASE) 50 MCG/ACT nasal spray Place 2 sprays into both nostrils daily.   . furosemide (LASIX) 40 MG tablet Take 1 tablet (40 mg total) by mouth daily. (Patient taking differently: Take 40 mg by mouth daily. Tk 1 tab qd on Sunday, Monday, Wednesday, and Friday)  . gabapentin (NEURONTIN) 100 MG capsule Take 1 capsule (100 mg total) by mouth at bedtime.  Marland Kitchen glimepiride (AMARYL) 1 MG tablet Take 1 mg by mouth daily with breakfast.  . hydrALAZINE (APRESOLINE) 50 MG tablet Take 50 mg by mouth See admin instructions. Take 1 tab twice daily on Sunday, Monday, Wednesday and Friday. Take 1 tab at bedtime(on  dialysis days) Tuesday, Thursday, and saturday  . ipratropium (ATROVENT) 0.03 % nasal spray Place 1 spray into both nostrils 3 (three) times daily.   Marland Kitchen ipratropium-albuterol (DUONEB) 0.5-2.5 (3) MG/3ML SOLN Take 3 mLs by nebulization every 6 (six) hours as needed.  . isosorbide mononitrate (IMDUR) 60 MG 24 hr tablet Take 30 mg by mouth 2 (two) times daily.  Marland Kitchen latanoprost (XALATAN) 0.005 % ophthalmic solution Place 1 drop into both eyes at bedtime.  . metoprolol succinate (TOPROL-XL) 25 MG 24 hr tablet Take 1 tablet (25 mg total) by mouth at bedtime.  . multivitamin (RENA-VIT) TABS tablet Take 1 tablet by mouth  daily.  . pantoprazole (PROTONIX) 40 MG tablet Take 1 tablet (40 mg total) by mouth daily.  . pravastatin (PRAVACHOL) 40 MG tablet Take 40 mg by mouth at bedtime.     Past Medical History:  Diagnosis Date  . Anxiety   . CAD (coronary artery disease)   . Chronic anemia    Dr Inez Pilgrim  . Dysrhythmia   . GERD (gastroesophageal reflux disease)   . Glaucoma   . Gout   . Gout   . Hx of colonic polyps   . Hyperlipidemia   . Hypertension   . Lower back pain   . Miscarriage   . Myocardial infarction ([place])   . 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 Neuropathy   . 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 Peritoneal dialysis status ([place])   . Renal failure    Dr Holley Raring  . Renal insufficiency   . 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 Vaginal delivery    x 4    Past Surgical History:  Procedure Laterality Date  . ABDOMINAL HYSTERECTOMY  1983  . AV FISTULA PLACEMENT Left 09/21/2016   Procedure: ARTERIOVENOUS (AV) FISTULA CREATION ( BRACHIOCEPHALIC );  Surgeon: Katha Cabal, MD;  Location: [facility] ORS;  Service: Vascular;  Laterality: Left;  . BACK SURGERY  6/08   Dr Collier Salina  . BACK SURGERY  1987   disc  . CHOLECYSTECTOMY    . DIALYSIS/PERMA CATHETER REMOVAL N/A 02/25/2017   Procedure: Dialysis/Perma Catheter Removal;  Surgeon: Algernon Huxley, MD;  Location: [place] CV LAB;  Service: Cardiovascular;  Laterality: N/A;  . INSERTION OF DIALYSIS CATHETER  2017  . PERITONEAL CATHETER INSERTION    . REMOVAL OF A DIALYSIS CATHETER N/A 09/21/2016   Procedure: REMOVAL OF A DIALYSIS CATHETER ( PERITONEAL DIALYSIS CATH );  Surgeon: Katha Cabal, MD;  Location: [facility] ORS;  Service: Vascular;  Laterality: N/A;  . VITRECTOMY AND CATARACT Left 11/04/2017   Procedure: VITRECTOMY AND CATARACT;  Surgeon: Leandrew Koyanagi, MD;  Location: [facility] ORS;  Service: Ophthalmology;  Laterality:  Left;  US00:51.6 AP%21.8 FFM38.46 FLUID LOT #[id number] H    Social History Social History   Tobacco Use  . Smoking status: Former Smoker    Last attempt to quit: 11/05/1990    Years since quitting: 27.2  . Smokeless tobacco: Never Used  Substance Use Topics  . Alcohol use: No  . Drug use: No    Family History Family History  Problem Relation Age of Onset  . Heart attack Father   . Hypertension Mother   . Hyperlipidemia Mother   . Coronary artery disease Unknown        strong fam hx  . Kidney failure Brother   .  Breast cancer Neg Hx     Allergies  Allergen Reactions  . Alprazolam Other (See Comments)    Unable to sleep and confusion     REVIEW OF SYSTEMS (Negative unless checked)  Constitutional: [] Weight loss  [] Fever  [] Chills Cardiac: [] Chest pain   [] Chest pressure   [] Palpitations   [] Shortness of breath when laying flat   [] Shortness of breath with exertion. Vascular:  [] Pain in legs with walking   [] Pain in legs at rest  [] History of DVT   [] Phlebitis   [] Swelling in legs   [] Varicose veins   [] Non-healing ulcers Pulmonary:   [] Uses home oxygen   [] Productive cough   [] Hemoptysis   [] Wheeze  [] COPD   [] Asthma Neurologic:  [] Dizziness   [] Seizures   [] History of stroke   [] History of TIA  [] Aphasia   [] Vissual changes   [] Weakness or numbness in arm   [] Weakness or numbness in leg Musculoskeletal:   [] Joint swelling   [] Joint pain   [] Low back pain Hematologic:  [x] Easy bruising  [] Easy bleeding   [] Hypercoagulable state   [] Anemic Gastrointestinal:  [] Diarrhea   [] Vomiting  [] Gastroesophageal reflux/heartburn   [] Difficulty swallowing. Genitourinary:  [x] Chronic kidney disease   [] Difficult urination  [] Frequent urination   [] Blood in urine Skin:  [] Rashes   [] Ulcers  Psychological:  [] History of anxiety   []  History of major depression.  Physical Examination  Vitals:   01/16/18 1506  BP: (!) 168/68  Pulse: 75  Resp: 16  Weight: 165 lb (74.8 kg)   Body  mass index is 25.46 kg/m. Gen: WD/WN, NAD Head: [place]/AT, No temporalis wasting.  Ear/Nose/Throat: Hearing grossly intact, nares w/o erythema or drainage Eyes: PER, EOMI, sclera nonicteric.  Neck: Supple, no large masses.   Pulmonary:  Good air movement, no audible wheezing bilaterally, no use of accessory muscles.  Cardiac: RRR, no JVD Vascular: Left brachiocephalic fistula good thrill good bruit mild pulsatility skin appears healthy and intact Vessel Right Left  Radial Palpable Palpable  Ulnar Palpable Palpable  Brachial Palpable Palpable  Gastrointestinal: Non-distended. No guarding/no peritoneal signs.  Musculoskeletal: M/S 5/5 throughout.  No deformity or atrophy.  Neurologic: CN 2-12 intact. Symmetrical.  Speech is fluent. Motor exam as listed above. Psychiatric: Judgment intact, Mood & affect appropriate for pt's clinical situation. Dermatologic: No rashes or ulcers noted.  No changes consistent with cellulitis. Lymph : No lichenification or skin changes of chronic lymphedema.  CBC Lab Results  Component Value Date   WBC 7.2 07/11/2017   HGB 12.2 11/04/2017   HCT 36.0 11/04/2017   MCV 105.9 (H) 07/11/2017   PLT 144 (L) 07/11/2017    BMET    Component Value Date/Time   NA 140 11/04/2017 0731   NA 142 10/27/2014 0839   K 4.0 11/04/2017 0731   K 3.2 (L) 10/27/2014 0839   CL 98 (L) 07/11/2017 1157   CL 104 10/27/2014 0839   CO2 31 07/11/2017 1157   CO2 29 10/27/2014 0839   GLUCOSE 83 11/04/2017 0731   GLUCOSE 108 (H) 10/27/2014 0839   BUN 52 (H) 07/11/2017 1157   BUN 44 (H) 10/27/2014 0839   CREATININE 5.82 (H) 07/11/2017 1157   CREATININE 7.75 (H) 10/27/2014 0839   CALCIUM 7.8 (L) 07/11/2017 1157   CALCIUM 7.7 (L) 10/27/2014 0839   GFRNONAA 6 (L) 07/11/2017 1157   GFRNONAA 5 (L) 10/27/2014 0839   GFRNONAA 5 (L) 10/30/2013 0933   GFRAA 7 (L) 07/11/2017 1157   GFRAA 7 (L) 10/27/2014  [place] 6 (L) 10/30/2013 0933   CrCl cannot be calculated (Patient's most  recent lab result is older than the maximum 21 days allowed.).  COAG Lab Results  Component Value Date   INR 1.02 01/22/2017   INR 0.92 09/19/2016   INR 1.07 06/16/2016    Radiology No results found.   Assessment/Plan 1. Complication of vascular access for dialysis, sequela Recommend:  The patient is doing well and currently has adequate dialysis access. The patient's dialysis center is not reporting any access issues. Flow pattern is stable when compared to the prior ultrasound.  The patient should have a duplex ultrasound of the dialysis access in 6 months. The patient will follow-up with me in the office after each ultrasound    - VAS [place] (AVF, AVG); Future  2. END STAGE RENAL DISEASE Continue dialysis without interruption  3. Type 2 diabetes mellitus with complication, unspecified whether long term insulin use (HCC) Continue hypoglycemic medications as already ordered, these medications have been reviewed and there are no changes at this time.  Hgb A1C to be monitored as already arranged by primary service   4. Gastroesophageal reflux disease without esophagitis Continue antihypertensive medications as already ordered, these medications have been reviewed and there are no changes at this time.  Avoidence of caffeine and alcohol  Moderate elevation of the head of the bed   5. Essential hypertension Continue antihypertensive medications as already ordered, these medications have been reviewed and there are no changes at this time.     Hortencia Pilar, MD  01/17/2018 12:52 PM

## 2018-01-28 ENCOUNTER — Other Ambulatory Visit: Payer: Self-pay

## 2018-01-28 ENCOUNTER — Emergency Department
Admission: EM | Admit: 2018-01-28 | Discharge: 2018-01-29 | Disposition: A | Discharge status: Home / Self Care | Payer: Medicare Other | Attending: Emergency Medicine | Admitting: Emergency Medicine

## 2018-01-28 ENCOUNTER — Encounter: Payer: Self-pay | Admitting: Emergency Medicine

## 2018-01-28 DIAGNOSIS — R5381 Other malaise: Secondary | ICD-10-CM | POA: Diagnosis not present

## 2018-01-28 DIAGNOSIS — Z7982 Long term (current) use of aspirin: Secondary | ICD-10-CM | POA: Insufficient documentation

## 2018-01-28 DIAGNOSIS — N390 Urinary tract infection, site not specified: Secondary | ICD-10-CM | POA: Diagnosis not present

## 2018-01-28 DIAGNOSIS — E119 Type 2 diabetes mellitus without complications: Secondary | ICD-10-CM | POA: Diagnosis not present

## 2018-01-28 DIAGNOSIS — Z87891 Personal history of nicotine dependence: Secondary | ICD-10-CM | POA: Diagnosis not present

## 2018-01-28 DIAGNOSIS — I132 Hypertensive heart and chronic kidney disease with heart failure and with stage 5 chronic kidney disease, or end stage renal disease: Secondary | ICD-10-CM | POA: Insufficient documentation

## 2018-01-28 DIAGNOSIS — R42 Dizziness and giddiness: Secondary | ICD-10-CM | POA: Diagnosis present

## 2018-01-28 DIAGNOSIS — I2510 Atherosclerotic heart disease of native coronary artery without angina pectoris: Secondary | ICD-10-CM | POA: Diagnosis not present

## 2018-01-28 DIAGNOSIS — Z7901 Long term (current) use of anticoagulants: Secondary | ICD-10-CM | POA: Diagnosis not present

## 2018-01-28 DIAGNOSIS — N186 End stage renal disease: Secondary | ICD-10-CM | POA: Insufficient documentation

## 2018-01-28 DIAGNOSIS — I509 Heart failure, unspecified: Secondary | ICD-10-CM | POA: Diagnosis not present

## 2018-01-28 DIAGNOSIS — Z79899 Other long term (current) drug therapy: Secondary | ICD-10-CM | POA: Insufficient documentation

## 2018-01-28 LAB — BASIC METABOLIC PANEL
ANION GAP: 15 (ref 5–15)
BUN: 47 mg/dL — AB (ref 6–20)
CALCIUM: 9.5 mg/dL (ref 8.9–10.3)
CO2: 26 mmol/L (ref 22–32)
Chloride: 93 mmol/L — ABNORMAL LOW (ref 101–111)
Creatinine, Ser: 6.62 mg/dL — ABNORMAL HIGH (ref 0.44–1.00)
GFR calc Af Amer: 6 mL/min — ABNORMAL LOW (ref >60)
GFR, EST NON AFRICAN AMERICAN: 5 mL/min — AB (ref >60)
GLUCOSE: 211 mg/dL — AB (ref 65–99)
Potassium: 4.5 mmol/L (ref 3.5–5.1)
Sodium: 134 mmol/L — ABNORMAL LOW (ref 135–145)

## 2018-01-28 LAB — CBC
HCT: 32.2 % — ABNORMAL LOW (ref 35.0–47.0)
HEMOGLOBIN: 10.5 g/dL — AB (ref 12.0–16.0)
MCH: 36 pg — AB (ref 26.0–34.0)
MCHC: 32.5 g/dL (ref 32.0–36.0)
MCV: 110.8 fL — ABNORMAL HIGH (ref 80.0–100.0)
Platelets: 171 10*3/uL (ref 150–440)
RBC: 2.91 MIL/uL — ABNORMAL LOW (ref 3.80–5.20)
RDW: 14.9 % — AB (ref 11.5–14.5)
WBC: 7.1 10*3/uL (ref 3.6–11.0)

## 2018-01-28 LAB — TROPONIN I: TROPONIN I: 0.06 ng/mL — AB (ref <0.03)

## 2018-01-28 NOTE — ED Triage Notes (Signed)
Pt in with co dizziness since today. Son states possible UTI states pt seems lethargic. Pt alert and awake upon arrival. Pt is a dialysis pt that had normal treatment yesterday.

## 2018-01-28 NOTE — ED Notes (Signed)
Troponin 0.06, provider will be notified.

## 2018-01-28 NOTE — ED Provider Notes (Signed)
[facility] Emergency Department Provider Note   ____________________________________________   I have reviewed the triage vital signs and the nursing notes.   HISTORY  Chief Complaint Dizziness   History limited by: Not Limited   HPI Laurie Steele is a 77 y.o. female who presents to the emergency department today via EMS because of concern for possible UTI in the setting of dizziness. The patient states that the dizziness started today. She describes it as the sense of lightheadedness. The patient does have end-stage renal disease and is on dialysis.  She underwent her normal course of dialysis yesterday.  She states she does still make some urine and there is concern for urinary tract infection.  The patient denies any fevers.  She denies any chest pain.   Per medical record review patient has a history of ESRD on dialysis. CAD.  Past Medical History:  Diagnosis Date  . Anxiety   . CAD (coronary artery disease)   . Chronic anemia    Dr Inez Pilgrim  . Dysrhythmia   . GERD (gastroesophageal reflux disease)   . Glaucoma   . Gout   . Gout   . Hx of colonic polyps   . Hyperlipidemia   . Hypertension   . Lower back pain   . Miscarriage   . Myocardial infarction ([place])   . 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 Neuropathy   . 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 Peritoneal dialysis status ([place])   . Renal failure    Dr Holley Raring  . Renal insufficiency   . 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 Vaginal delivery    x 4    Patient Active Problem List   Diagnosis Date Noted  . Complication of vascular access for dialysis 07/18/2017  . UTI (urinary tract infection) 01/22/2017  . CHF (congestive heart failure) ([place]) 12/11/2016  . Ds DNA antibody positive 07/09/2016  . Pneumonia 07/03/2016  . Anemia 06/17/2016  . Symptomatic anemia 06/15/2016  .  Hypoglycemia 05/21/2016  . Chest pain 04/05/2016  . Hypercalcemia 03/14/2016  . Bronchitis 03/03/2012  . Type II or unspecified type diabetes mellitus with renal manifestations, not stated as uncontrolled(250.40) 12/18/2011  . END STAGE RENAL DISEASE 10/23/2010  . DM (diabetes mellitus), type 2 ([place]) 12/13/2009  . NEUROPATHY 12/13/2009  . GOUT 02/15/2009  . SLEEP DISORDER 02/15/2009  . GERD 02/23/2008  . Hyperlipidemia 05/14/2007  . ANEMIA, CHRONIC 05/14/2007  . Essential hypertension 05/14/2007    Past Surgical History:  Procedure Laterality Date  . ABDOMINAL HYSTERECTOMY  1983  . AV FISTULA PLACEMENT Left 09/21/2016   Procedure: ARTERIOVENOUS (AV) FISTULA CREATION ( BRACHIOCEPHALIC );  Surgeon: Katha Cabal, MD;  Location: [facility] ORS;  Service: Vascular;  Laterality: Left;  . BACK SURGERY  6/08   Dr Collier Salina  . BACK SURGERY  1987   disc  . CHOLECYSTECTOMY    . DIALYSIS/PERMA CATHETER REMOVAL N/A 02/25/2017   Procedure: Dialysis/Perma Catheter Removal;  Surgeon: Algernon Huxley, MD;  Location: [place] CV LAB;  Service: Cardiovascular;  Laterality: N/A;  . INSERTION OF DIALYSIS CATHETER  2017  . PERITONEAL CATHETER INSERTION    . REMOVAL OF A DIALYSIS CATHETER N/A 09/21/2016   Procedure: REMOVAL OF A DIALYSIS CATHETER ( PERITONEAL DIALYSIS CATH );  Surgeon: Katha Cabal, MD;  Location: [facility] ORS;  Service: Vascular;  Laterality:  N/A;  . VITRECTOMY AND CATARACT Left 11/04/2017   Procedure: VITRECTOMY AND CATARACT;  Surgeon: Leandrew Koyanagi, MD;  Location: [facility] ORS;  Service: Ophthalmology;  Laterality: Left;  US00:51.6 AP%21.8 KZL93.57 FLUID LOT #[id number] H    Prior to Admission medications   Medication Sig Start Date End Date Taking? Authorizing Provider  acetaminophen (TYLENOL) 650 MG CR tablet Take 650 mg by mouth every 8 (eight) hours as needed for pain.    [provider]  amitriptyline (ELAVIL) 25 MG tablet Take 25 mg by mouth at bedtime as needed  (foot pain).    [provider]  amLODipine (NORVASC) 10 MG tablet Take 10 mg by mouth daily. 11/03/16   [provider]  aspirin EC 81 MG tablet Take 1 tablet (81 mg total) by mouth daily. 06/18/16   Fritzi Mandes, MD  cinacalcet (SENSIPAR) 90 MG tablet Take 90 mg by mouth daily. With lunch    [provider]  cloNIDine (CATAPRES) 0.1 MG tablet Take 0.1 mg by mouth See admin instructions. tk 1 tab po bid on sun, mon, wed, and fri, then tk 1 tab qhs on tues, thurs, and sat 11/08/16   [provider]  clopidogrel (PLAVIX) 75 MG tablet Take 1 tablet (75 mg total) by mouth daily. 01/26/17   Nicholes Mango, MD  colchicine 0.6 MG tablet Take 0.5 tablets (0.3 mg total) by mouth 2 (two) times a week. Patient taking differently: Take 0.6 mg by mouth 2 (two) times daily.  07/09/16   Demetrios Loll, MD  dextromethorphan-guaiFENesin [facility] DM) 30-600 MG 12hr tablet Take 1 tablet by mouth 2 (two) times daily.    [provider]  diclofenac sodium (VOLTAREN) 1 % GEL Apply 2 g four times a day PRN Not a candidate for oral NSAIDS, ESRD on PD 06/27/16   [provider]  docusate sodium (COLACE) 100 MG capsule Take 1 capsule (100 mg total) by mouth daily as needed for mild constipation. 12/13/16   Loletha Grayer, MD  famotidine (PEPCID) 20 MG tablet Take 20 mg by mouth at bedtime.  11/29/16   [provider]  feeding supplement, ENSURE ENLIVE, (ENSURE ENLIVE) LIQD Take 237 mLs by mouth 2 (two) times daily between meals. 01/25/17   Max Sane, MD  ferrous sulfate 325 (65 FE) MG tablet Take 1 tablet (325 mg total) by mouth daily with breakfast. 06/18/16   Fritzi Mandes, MD  fluticasone [facility]) 50 MCG/ACT nasal spray Place 2 sprays into both nostrils daily.     [provider]  furosemide (LASIX) 40 MG tablet Take 1 tablet (40 mg total) by mouth daily. Patient taking differently: Take 40 mg by mouth daily. Tk 1 tab qd on Sunday, Monday, Wednesday, and Friday  07/05/16   Demetrios Loll, MD  gabapentin (NEURONTIN) 100 MG capsule Take 1 capsule (100 mg total) by mouth at bedtime. 07/05/16   Demetrios Loll, MD  glimepiride (AMARYL) 1 MG tablet Take 1 mg by mouth daily with breakfast.    [provider]  hydrALAZINE (APRESOLINE) 50 MG tablet Take 50 mg by mouth See admin instructions. Take 1 tab twice daily on Sunday, Monday, Wednesday and Friday. Take 1 tab at bedtime(on dialysis days) Tuesday, Thursday, and saturday 11/29/16   [provider]  insulin aspart (NOVOLOG) 100 UNIT/ML injection Inject 0-5 Units into the skin at bedtime. Patient not taking: Reported on 11/04/2017 05/28/16   Fritzi Mandes, MD  ipratropium (ATROVENT) 0.03 % nasal spray Place 1 spray into both nostrils 3 (three)  times daily.     [provider]  ipratropium-albuterol (DUONEB) 0.5-2.5 (3) MG/3ML SOLN Take 3 mLs by nebulization every 6 (six) hours as needed. 12/14/16   Gladstone Lighter, MD  isosorbide mononitrate (IMDUR) 60 MG 24 hr tablet Take 30 mg by mouth 2 (two) times daily. 11/03/16   [provider]  latanoprost (XALATAN) 0.005 % ophthalmic solution Place 1 drop into both eyes at bedtime. 03/20/16   [provider]  metoprolol succinate (TOPROL-XL) 25 MG 24 hr tablet Take 1 tablet (25 mg total) by mouth at bedtime. 12/13/16   Loletha Grayer, MD  multivitamin (RENA-VIT) TABS tablet Take 1 tablet by mouth daily.    [provider]  ondansetron (ZOFRAN ODT) 4 MG disintegrating tablet Take 1 tablet (4 mg total) by mouth every 8 (eight) hours as needed for nausea or vomiting. Patient not taking: Reported on 07/11/2017 04/04/17   Darel Hong, MD  pantoprazole (PROTONIX) 40 MG tablet Take 1 tablet (40 mg total) by mouth daily. 06/18/16   Fritzi Mandes, MD  pravastatin (PRAVACHOL) 40 MG tablet Take 40 mg by mouth at bedtime.  06/21/15   [provider]    Allergies Alprazolam  Family History  Problem Relation Age of Onset  . Heart  attack Father   . Hypertension Mother   . Hyperlipidemia Mother   . Coronary artery disease Unknown        strong fam hx  . Kidney failure Brother   . Breast cancer Neg Hx     Social History Social History   Tobacco Use  . Smoking status: Former Smoker    Last attempt to quit: 11/05/1990    Years since quitting: 27.2  . Smokeless tobacco: Never Used  Substance Use Topics  . Alcohol use: No  . Drug use: No    Review of Systems Constitutional: No fever/chills. Positive for lightheadedness. Eyes: No visual changes. ENT: No sore throat. Cardiovascular: Denies chest pain. Respiratory: Denies shortness of breath. Gastrointestinal: No abdominal pain.  No nausea, no vomiting.  No diarrhea.   Genitourinary: Negative for dysuria. Musculoskeletal: Negative for back pain. Skin: Negative for rash. Neurological: Negative for headaches, focal weakness or numbness.  ____________________________________________   PHYSICAL EXAM:  VITAL SIGNS: ED Triage Vitals  Enc Vitals Group     BP 01/28/18 2054 (!) 127/50     Pulse Rate 01/28/18 2053 (!) 55     Resp 01/28/18 2053 20     Temp 01/28/18 2053 97.8 F (36.6 C)     Temp Source 01/28/18 2053 Oral     SpO2 01/28/18 2053 93 %     Weight 01/28/18 2051 185 lb 3 oz (84 kg)     Height --      Head Circumference --      Peak Flow --      Pain Score 01/28/18 2051 10   Constitutional: Alert and oriented. Well appearing and in no distress. Eyes: Conjunctivae are normal.  ENT   Head: Normocephalic and atraumatic.   Nose: No congestion/rhinnorhea.   Mouth/Throat: Mucous membranes are moist.   Neck: No stridor. Hematological/Lymphatic/Immunilogical: No cervical lymphadenopathy. Cardiovascular: Normal rate, regular rhythm.  No murmurs, rubs, or gallops.  Respiratory: Normal respiratory effort without tachypnea nor retractions. Breath sounds are clear and equal bilaterally. No wheezes/rales/rhonchi. Gastrointestinal: Soft and  non tender. No rebound. No guarding.  Genitourinary: Deferred Musculoskeletal: Normal range of motion in all extremities. No lower extremity edema. Neurologic:  Normal speech and language. No gross focal  neurologic deficits are appreciated.  Skin:  Skin is warm, dry and intact. No rash noted. Psychiatric: Mood and affect are normal. Speech and behavior are normal. Patient exhibits appropriate insight and judgment.  ____________________________________________    LABS (pertinent positives/negatives)  CBC wbc 7.1, hgb 10.5, plt 171 Trop 0.06 BMP na 134, k 4.5, glu 211, cr 6.62  ____________________________________________   EKG  I, Nance Pear, attending physician, personally viewed and interpreted this EKG  EKG Time: 2055 Rate: 54 Rhythm: sinus bradycardia Axis: left axis deviation Intervals: qtc 474 QRS: narrow, q waves v1 ST changes: no st elevation Impression: abnormal ekg   ____________________________________________    RADIOLOGY  None  ____________________________________________   PROCEDURES  Procedures  ____________________________________________   INITIAL IMPRESSION / ASSESSMENT AND PLAN / ED COURSE  Pertinent labs & imaging results that were available during my care of the patient were reviewed by me and considered in my medical decision making (see chart for details).  Patient presented to the emergency department today because of concerns for dizziness possible urinary tract infection.  Blood work shows elevated creatinine which is consistent with patient's baseline although no other obvious etiology of the patient's symptoms.  Troponin is also patient's baseline.  Currently awaiting urine.   ____________________________________________   FINAL CLINICAL IMPRESSION(S) / ED DIAGNOSES  Weakness Dizziness  Note: This dictation was prepared with Dragon dictation. Any transcriptional errors that result from this process are  unintentional     Nance Pear, MD 01/29/18 [phone]

## 2018-01-28 NOTE — ED Notes (Signed)
Pt assisted to the restroom

## 2018-01-29 DIAGNOSIS — N390 Urinary tract infection, site not specified: Secondary | ICD-10-CM | POA: Diagnosis not present

## 2018-01-29 LAB — URINALYSIS, COMPLETE (UACMP) WITH MICROSCOPIC
Bacteria, UA: NONE SEEN
Bilirubin Urine: NEGATIVE
GLUCOSE, UA: NEGATIVE mg/dL
Ketones, ur: NEGATIVE mg/dL
Nitrite: NEGATIVE
PH: 7 (ref 5.0–8.0)
Protein, ur: 100 mg/dL — AB
Specific Gravity, Urine: 1.011 (ref 1.005–1.030)

## 2018-01-29 MED ORDER — CEPHALEXIN 250 MG PO CAPS: 250.0000 mg | ORAL_CAPSULE | Freq: Two times a day (BID) | ORAL | 0 refills | Status: AC

## 2018-01-29 NOTE — ED Notes (Signed)
Finally able to get ahold of one of patient's son to let him or one of his siblings that his mother was ready for discharge.

## 2018-01-29 NOTE — ED Provider Notes (Signed)
Care signed over from Dr. Archie Balboa pending results of urinalysis.  UA equivocal.  Patient does have some mild discomfort.  No abdominal pain.  Will discharge home on Keflex 250 mg twice a day for 5 days given her renal dysfunction.  I reviewed previous cultures and no abnormal cultures have been found.  Reculture today.   Darel Hong, MD 01/29/18 [phone]

## 2018-01-30 LAB — URINE CULTURE: CULTURE: NO GROWTH

## 2018-02-26 ENCOUNTER — Other Ambulatory Visit: Payer: Self-pay

## 2018-02-26 ENCOUNTER — Emergency Department: Payer: Medicare Other

## 2018-02-26 ENCOUNTER — Emergency Department
Admission: EM | Admit: 2018-02-26 | Discharge: 2018-02-26 | Disposition: A | Discharge status: Home / Self Care | Payer: Medicare Other | Source: Home / Self Care | Attending: Emergency Medicine | Admitting: Emergency Medicine

## 2018-02-26 DIAGNOSIS — E1121 Type 2 diabetes mellitus with diabetic nephropathy: Secondary | ICD-10-CM | POA: Diagnosis present

## 2018-02-26 DIAGNOSIS — I132 Hypertensive heart and chronic kidney disease with heart failure and with stage 5 chronic kidney disease, or end stage renal disease: Secondary | ICD-10-CM | POA: Diagnosis present

## 2018-02-26 DIAGNOSIS — I252 Old myocardial infarction: Secondary | ICD-10-CM

## 2018-02-26 DIAGNOSIS — M25551 Pain in right hip: Secondary | ICD-10-CM | POA: Diagnosis present

## 2018-02-26 DIAGNOSIS — F419 Anxiety disorder, unspecified: Secondary | ICD-10-CM | POA: Diagnosis present

## 2018-02-26 DIAGNOSIS — E1122 Type 2 diabetes mellitus with diabetic chronic kidney disease: Secondary | ICD-10-CM | POA: Insufficient documentation

## 2018-02-26 DIAGNOSIS — H409 Unspecified glaucoma: Secondary | ICD-10-CM | POA: Diagnosis present

## 2018-02-26 DIAGNOSIS — Z7982 Long term (current) use of aspirin: Secondary | ICD-10-CM | POA: Insufficient documentation

## 2018-02-26 DIAGNOSIS — W010XXA Fall on same level from slipping, tripping and stumbling without subsequent striking against object, initial encounter: Secondary | ICD-10-CM

## 2018-02-26 DIAGNOSIS — D539 Nutritional anemia, unspecified: Secondary | ICD-10-CM | POA: Diagnosis present

## 2018-02-26 DIAGNOSIS — W19XXXA Unspecified fall, initial encounter: Secondary | ICD-10-CM | POA: Diagnosis present

## 2018-02-26 DIAGNOSIS — Y92008 Other place in unspecified non-institutional (private) residence as the place of occurrence of the external cause: Secondary | ICD-10-CM

## 2018-02-26 DIAGNOSIS — S20211A Contusion of right front wall of thorax, initial encounter: Secondary | ICD-10-CM | POA: Diagnosis present

## 2018-02-26 DIAGNOSIS — E785 Hyperlipidemia, unspecified: Secondary | ICD-10-CM | POA: Diagnosis present

## 2018-02-26 DIAGNOSIS — I509 Heart failure, unspecified: Secondary | ICD-10-CM | POA: Insufficient documentation

## 2018-02-26 DIAGNOSIS — E1165 Type 2 diabetes mellitus with hyperglycemia: Secondary | ICD-10-CM | POA: Diagnosis present

## 2018-02-26 DIAGNOSIS — Y999 Unspecified external cause status: Secondary | ICD-10-CM | POA: Insufficient documentation

## 2018-02-26 DIAGNOSIS — Z7902 Long term (current) use of antithrombotics/antiplatelets: Secondary | ICD-10-CM

## 2018-02-26 DIAGNOSIS — Z87891 Personal history of nicotine dependence: Secondary | ICD-10-CM

## 2018-02-26 DIAGNOSIS — Z794 Long term (current) use of insulin: Secondary | ICD-10-CM

## 2018-02-26 DIAGNOSIS — N186 End stage renal disease: Secondary | ICD-10-CM | POA: Insufficient documentation

## 2018-02-26 DIAGNOSIS — I2510 Atherosclerotic heart disease of native coronary artery without angina pectoris: Secondary | ICD-10-CM | POA: Insufficient documentation

## 2018-02-26 DIAGNOSIS — S46911A Strain of unspecified muscle, fascia and tendon at shoulder and upper arm level, right arm, initial encounter: Secondary | ICD-10-CM | POA: Diagnosis present

## 2018-02-26 DIAGNOSIS — T428X5A Adverse effect of antiparkinsonism drugs and other central muscle-tone depressants, initial encounter: Secondary | ICD-10-CM | POA: Diagnosis present

## 2018-02-26 DIAGNOSIS — Z888 Allergy status to other drugs, medicaments and biological substances status: Secondary | ICD-10-CM

## 2018-02-26 DIAGNOSIS — N2581 Secondary hyperparathyroidism of renal origin: Secondary | ICD-10-CM | POA: Diagnosis present

## 2018-02-26 DIAGNOSIS — E1140 Type 2 diabetes mellitus with diabetic neuropathy, unspecified: Secondary | ICD-10-CM

## 2018-02-26 DIAGNOSIS — D631 Anemia in chronic kidney disease: Secondary | ICD-10-CM | POA: Diagnosis present

## 2018-02-26 DIAGNOSIS — I248 Other forms of acute ischemic heart disease: Secondary | ICD-10-CM | POA: Diagnosis present

## 2018-02-26 DIAGNOSIS — M109 Gout, unspecified: Secondary | ICD-10-CM | POA: Diagnosis present

## 2018-02-26 DIAGNOSIS — Y9301 Activity, walking, marching and hiking: Secondary | ICD-10-CM | POA: Diagnosis present

## 2018-02-26 DIAGNOSIS — Z992 Dependence on renal dialysis: Secondary | ICD-10-CM

## 2018-02-26 DIAGNOSIS — E11319 Type 2 diabetes mellitus with unspecified diabetic retinopathy without macular edema: Secondary | ICD-10-CM | POA: Diagnosis present

## 2018-02-26 DIAGNOSIS — N390 Urinary tract infection, site not specified: Secondary | ICD-10-CM | POA: Diagnosis present

## 2018-02-26 DIAGNOSIS — Y92009 Unspecified place in unspecified non-institutional (private) residence as the place of occurrence of the external cause: Secondary | ICD-10-CM

## 2018-02-26 DIAGNOSIS — G92 Toxic encephalopathy: Secondary | ICD-10-CM | POA: Diagnosis not present

## 2018-02-26 DIAGNOSIS — K219 Gastro-esophageal reflux disease without esophagitis: Secondary | ICD-10-CM

## 2018-02-26 DIAGNOSIS — Z79899 Other long term (current) drug therapy: Secondary | ICD-10-CM

## 2018-02-26 DIAGNOSIS — E1151 Type 2 diabetes mellitus with diabetic peripheral angiopathy without gangrene: Secondary | ICD-10-CM | POA: Diagnosis present

## 2018-02-26 MED ORDER — BACLOFEN 10 MG PO TABS: 10.0000 mg | ORAL_TABLET | Freq: Three times a day (TID) | ORAL | 0 refills | Status: DC

## 2018-02-26 MED ORDER — BACLOFEN 10 MG PO TABS
10.0000 mg | ORAL_TABLET | Freq: Once | ORAL | Status: AC
  Administered 2018-02-26: 10 mg via ORAL
  Filled 2018-02-26 (×2): qty 1

## 2018-02-26 NOTE — ED Triage Notes (Signed)
FIRST NURSE NOTE-fell yesterday, mechanical. Brought by EMS after dialysis because hurts all over.  VSS with [facility]

## 2018-02-26 NOTE — ED Triage Notes (Signed)
Pt states that she fell Monday at home and since then has been having generalized body pain since the fall - pt has no specific complaint

## 2018-02-26 NOTE — ED Provider Notes (Signed)
[facility] Emergency Department Provider Note ____________________________________________  Time seen: Approximately 5:41 PM  I have reviewed the triage vital signs and the nursing notes.   HISTORY  Chief Complaint Fall and Generalized Body Aches    HPI Laurie Steele is a 77 y.o. female who presents to the emergency department for evaluation and treatment of generalized body pain since a fall on Monday.  Patient states she was walking down her hallway and must have stumbled on the carpet causing her to lose her balance.  She states that while she was falling she attempted to reach out for the bathroom door to break her fall, but states she ended up landing hard on her shoulder.  She states that since that time, she has had generalized pain over the right shoulder, rib, and hip.  She states that she has been in bed most of the time since the fall, but was able to get up and go to dialysis today.  She was able to complete all but just a few minutes of the dialysis but states she began to have cramping and did not fully complete it. Past Medical History:  Diagnosis Date  . Anxiety   . CAD (coronary artery disease)   . Chronic anemia    Dr Inez Pilgrim  . Dysrhythmia   . GERD (gastroesophageal reflux disease)   . Glaucoma   . Gout   . Gout   . Hx of colonic polyps   . Hyperlipidemia   . Hypertension   . Lower back pain   . Miscarriage   . Myocardial infarction ([place])   . 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 Neuropathy   . 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 Peritoneal dialysis status ([place])   . Renal failure    Dr Holley Raring  . Renal insufficiency   . 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 Vaginal delivery    x 4    Patient Active Problem List   Diagnosis Date Noted  . Complication of vascular access for dialysis 07/18/2017  . UTI (urinary tract  infection) 01/22/2017  . CHF (congestive heart failure) ([place]) 12/11/2016  . Ds DNA antibody positive 07/09/2016  . Pneumonia 07/03/2016  . Anemia 06/17/2016  . Symptomatic anemia 06/15/2016  . Hypoglycemia 05/21/2016  . Chest pain 04/05/2016  . Hypercalcemia 03/14/2016  . Bronchitis 03/03/2012  . Type II or unspecified type diabetes mellitus with renal manifestations, not stated as uncontrolled(250.40) 12/18/2011  . END STAGE RENAL DISEASE 10/23/2010  . DM (diabetes mellitus), type 2 ([place]) 12/13/2009  . NEUROPATHY 12/13/2009  . GOUT 02/15/2009  . SLEEP DISORDER 02/15/2009  . GERD 02/23/2008  . Hyperlipidemia 05/14/2007  . ANEMIA, CHRONIC 05/14/2007  . Essential hypertension 05/14/2007    Past Surgical History:  Procedure Laterality Date  . ABDOMINAL HYSTERECTOMY  1983  . AV FISTULA PLACEMENT Left 09/21/2016   Procedure: ARTERIOVENOUS (AV) FISTULA CREATION ( BRACHIOCEPHALIC );  Surgeon: Katha Cabal, MD;  Location: [facility] ORS;  Service: Vascular;  Laterality: Left;  . BACK SURGERY  6/08   Dr Collier Salina  . BACK SURGERY  1987   disc  . CHOLECYSTECTOMY    . DIALYSIS/PERMA CATHETER REMOVAL N/A 02/25/2017   Procedure: Dialysis/Perma Catheter Removal;  Surgeon: Algernon Huxley, MD;  Location: [place] [facility];  Service: Cardiovascular;  Laterality: N/A;  . INSERTION OF DIALYSIS CATHETER  2017  . PERITONEAL CATHETER INSERTION    . REMOVAL OF A DIALYSIS CATHETER N/A 09/21/2016   Procedure: REMOVAL OF A DIALYSIS CATHETER ( PERITONEAL DIALYSIS CATH );  Surgeon: Katha Cabal, MD;  Location: [facility] ORS;  Service: Vascular;  Laterality: N/A;  . VITRECTOMY AND CATARACT Left 11/04/2017   Procedure: VITRECTOMY AND CATARACT;  Surgeon: Leandrew Koyanagi, MD;  Location: [facility] ORS;  Service: Ophthalmology;  Laterality: Left;  US00:51.6 AP%21.8 BTD17.61 FLUID LOT #[id number]    Prior to Admission medications   Medication Sig Start Date End Date Taking? Authorizing Provider   acetaminophen (TYLENOL) 650 MG CR tablet Take 650 mg by mouth every 8 (eight) hours as needed for pain.    [provider]  amitriptyline (ELAVIL) 25 MG tablet Take 25 mg by mouth at bedtime as needed (foot pain).    [provider]  amLODipine (NORVASC) 10 MG tablet Take 10 mg by mouth daily. 11/03/16   [provider]  aspirin EC 81 MG tablet Take 1 tablet (81 mg total) by mouth daily. 06/18/16   Fritzi Mandes, MD  baclofen (LIORESAL) 10 MG tablet Take 1 tablet (10 mg total) by mouth 3 (three) times daily. 02/26/18   Dvontae Ruan, Johnette Abraham B, FNP  cinacalcet (SENSIPAR) 90 MG tablet Take 90 mg by mouth daily. With lunch    [provider]  cloNIDine (CATAPRES) 0.1 MG tablet Take 0.1 mg by mouth See admin instructions. tk 1 tab po bid on sun, mon, wed, and fri, then tk 1 tab qhs on tues, thurs, and sat 11/08/16   [provider]  clopidogrel (PLAVIX) 75 MG tablet Take 1 tablet (75 mg total) by mouth daily. 01/26/17   Nicholes Mango, MD  colchicine 0.6 MG tablet Take 0.5 tablets (0.3 mg total) by mouth 2 (two) times a week. Patient taking differently: Take 0.6 mg by mouth 2 (two) times daily.  07/09/16   Demetrios Loll, MD  dextromethorphan-guaiFENesin [facility] DM) 30-600 MG 12hr tablet Take 1 tablet by mouth 2 (two) times daily.    [provider]  diclofenac sodium (VOLTAREN) 1 % GEL Apply 2 g four times a day PRN Not a candidate for oral NSAIDS, ESRD on PD 06/27/16   [provider]  docusate sodium (COLACE) 100 MG capsule Take 1 capsule (100 mg total) by mouth daily as needed for mild constipation. 12/13/16   Loletha Grayer, MD  famotidine (PEPCID) 20 MG tablet Take 20 mg by mouth at bedtime.  11/29/16   [provider]  feeding supplement, ENSURE ENLIVE, (ENSURE ENLIVE) LIQD Take 237 mLs by mouth 2 (two) times daily between meals. 01/25/17   Max Sane, MD  ferrous sulfate 325 (65 FE) MG tablet Take 1 tablet (325 mg total) by mouth daily with  breakfast. 06/18/16   Fritzi Mandes, MD  fluticasone [facility]) 50 MCG/ACT nasal spray Place 2 sprays into both nostrils daily.     [provider]  furosemide (LASIX) 40 MG tablet Take 1 tablet (40 mg total) by mouth daily. Patient taking differently: Take 40 mg by mouth daily. Tk 1 tab qd on Sunday, Monday, Wednesday, and Friday 07/05/16   Demetrios Loll, MD  gabapentin (NEURONTIN) 100 MG capsule Take 1 capsule (100 mg total) by mouth at bedtime. 07/05/16   Demetrios Loll, MD  glimepiride (AMARYL) 1 MG tablet Take 1 mg by mouth daily with breakfast.    [provider]  hydrALAZINE (APRESOLINE) 50 MG tablet Take 50 mg by mouth See admin instructions.  Take 1 tab twice daily on Sunday, Monday, Wednesday and Friday. Take 1 tab at bedtime(on dialysis days) Tuesday, Thursday, and saturday 11/29/16   [provider]  insulin aspart (NOVOLOG) 100 UNIT/ML injection Inject 0-5 Units into the skin at bedtime. Patient not taking: Reported on 11/04/2017 05/28/16   Fritzi Mandes, MD  ipratropium (ATROVENT) 0.03 % nasal spray Place 1 spray into both nostrils 3 (three) times daily.     [provider]  ipratropium-albuterol (DUONEB) 0.5-2.5 (3) MG/3ML SOLN Take 3 mLs by nebulization every 6 (six) hours as needed. 12/14/16   Gladstone Lighter, MD  isosorbide mononitrate (IMDUR) 60 MG 24 hr tablet Take 30 mg by mouth 2 (two) times daily. 11/03/16   [provider]  latanoprost (XALATAN) 0.005 % ophthalmic solution Place 1 drop into both eyes at bedtime. 03/20/16   [provider]  metoprolol succinate (TOPROL-XL) 25 MG 24 hr tablet Take 1 tablet (25 mg total) by mouth at bedtime. 12/13/16   Loletha Grayer, MD  multivitamin (RENA-VIT) TABS tablet Take 1 tablet by mouth daily.    [provider]  ondansetron (ZOFRAN ODT) 4 MG disintegrating tablet Take 1 tablet (4 mg total) by mouth every 8 (eight) hours as needed for nausea or vomiting. Patient not taking: Reported on  07/11/2017 04/04/17   Darel Hong, MD  pantoprazole (PROTONIX) 40 MG tablet Take 1 tablet (40 mg total) by mouth daily. 06/18/16   Fritzi Mandes, MD  pravastatin (PRAVACHOL) 40 MG tablet Take 40 mg by mouth at bedtime.  06/21/15   [provider]    Allergies Alprazolam  Family History  Problem Relation Age of Onset  . Heart attack Father   . Hypertension Mother   . Hyperlipidemia Mother   . Coronary artery disease Unknown        strong fam hx  . Kidney failure Brother   . Breast cancer Neg Hx     Social History Social History   Tobacco Use  . Smoking status: Former Smoker    Last attempt to quit: 11/05/1990    Years since quitting: 27.3  . Smokeless tobacco: Never Used  Substance Use Topics  . Alcohol use: No  . Drug use: No    Review of Systems Constitutional: Negative for fever. Cardiovascular: Negative for chest pain. Respiratory: Negative for shortness of breath. Musculoskeletal: Positive for right side shoulder, chest wall, and hip pain Skin: Negative for contusion, abrasion, or laceration. Neurological: Negative for decrease in sensation  ____________________________________________   PHYSICAL EXAM:  VITAL SIGNS: ED Triage Vitals  Enc Vitals Group     BP 02/26/18 1703 (!) 158/52     Pulse Rate 02/26/18 1703 73     Resp 02/26/18 1703 15     Temp 02/26/18 1703 98.2 F (36.8 C)     Temp Source 02/26/18 1703 Oral     SpO2 02/26/18 1703 97 %     Weight 02/26/18 1701 170 lb (77.1 kg)     Height 02/26/18 1701 5\' 7"  (1.702 m)     Head Circumference --      Peak Flow --      Pain Score 02/26/18 1701 10     Pain Loc --      Pain Edu? --      Excl. in [place]? --     Constitutional: Alert and oriented. Well appearing and in no acute distress. Eyes: Conjunctivae are clear without discharge or drainage Head: Atraumatic Neck: Diffuse tenderness without focal midline tenderness.  Respiratory: No cough. Respirations are even and unlabored. Musculoskeletal:  Pain with abduction and attempt at internal rotation of the right shoulder.  No pain over the right elbow with flexion, extension, or palpation.  No right hand or wrist pain on exam.  Tenderness over the right lateral chest wall with light palpation.  No flail segment observed.  Diffuse pain over the right hip, but no increase in pain with knee flexion or internal rotation of the hip.  Left upper and lower extremity is non-painful with range of motion. Neurologic: Awake, alert, orientedx4. Skin: Intact without contusion, hematoma, abrasion, or laceration over areas of complaint. Psychiatric: Affect and behavior are appropriate.  ____________________________________________   LABS (all labs ordered are listed, but only abnormal results are displayed)  Labs Reviewed - No data to display ____________________________________________  RADIOLOGY  Right shoulder, right ribs with chest film, and right hip are all negative for acute findings per radiology. ____________________________________________   PROCEDURES  Procedures  ____________________________________________   INITIAL IMPRESSION / ASSESSMENT AND PLAN / ED COURSE  Laurie Steele is a 77 y.o. who presents to the emergency department for evaluation and treatment after mechanical, non-syncopal fall 2 days ago.  Images and exam are consistent with musculoskeletal strain.  She will be given a prescription for baclofen and encouraged to continue taking her Tylenol.  Patient instructed to follow-up with orthopedics for symptoms that are not improving over the next few days.  She was also instructed to return to the emergency department for symptoms that change or worsen if unable schedule an appointment with orthopedics or primary care.  Medications  baclofen (LIORESAL) tablet 10 mg (10 mg Oral Given 02/26/18 2003)    Pertinent labs & imaging results that were available during my care of the patient were reviewed by me and considered  in my medical decision making (see chart for details).  _________________________________________   FINAL CLINICAL IMPRESSION(S) / ED DIAGNOSES  Final diagnoses:  Shoulder strain, right, initial encounter  Acute right hip pain  Rib contusion, right, initial encounter    ED Discharge Orders        Ordered    baclofen (LIORESAL) 10 MG tablet  3 times daily     02/26/18 1922       If controlled substance prescribed during this visit, 12 month history viewed on the [place] prior to issuing an initial prescription for Schedule II or III opiod.    Victorino Dike, FNP 02/27/18 0000    Eula Listen, MD 03/04/18 [phone]

## 2018-02-26 NOTE — Discharge Instructions (Signed)
Please follow up with your primary care provider if not improving over the week. Return to the ER for symptoms that change or worsen or for new concerns if unable to see your primary care provider.

## 2018-02-26 NOTE — ED Notes (Signed)
See triage note  States she fell on Monday  Having generalized pain

## 2018-02-27 ENCOUNTER — Emergency Department: Payer: Medicare Other

## 2018-02-27 ENCOUNTER — Inpatient Hospital Stay
Admission: EM | Admit: 2018-02-27 | Discharge: 2018-03-04 | DRG: 091 | Disposition: A | Discharge status: Skilled Nursing Facility | Payer: Medicare Other | Attending: Internal Medicine | Admitting: Internal Medicine

## 2018-02-27 ENCOUNTER — Other Ambulatory Visit: Payer: Self-pay

## 2018-02-27 DIAGNOSIS — R4182 Altered mental status, unspecified: Secondary | ICD-10-CM | POA: Diagnosis present

## 2018-02-27 DIAGNOSIS — N390 Urinary tract infection, site not specified: Secondary | ICD-10-CM

## 2018-02-27 DIAGNOSIS — R410 Disorientation, unspecified: Secondary | ICD-10-CM

## 2018-02-27 LAB — COMPREHENSIVE METABOLIC PANEL
ALK PHOS: 165 U/L — AB (ref 38–126)
ALT: 45 U/L (ref 14–54)
AST: 45 U/L — ABNORMAL HIGH (ref 15–41)
Albumin: 3.4 g/dL — ABNORMAL LOW (ref 3.5–5.0)
Anion gap: 17 — ABNORMAL HIGH (ref 5–15)
BILIRUBIN TOTAL: 0.7 mg/dL (ref 0.3–1.2)
BUN: 49 mg/dL — ABNORMAL HIGH (ref 6–20)
CALCIUM: 8.1 mg/dL — AB (ref 8.9–10.3)
CO2: 26 mmol/L (ref 22–32)
CREATININE: 6.68 mg/dL — AB (ref 0.44–1.00)
Chloride: 96 mmol/L — ABNORMAL LOW (ref 101–111)
GFR calc non Af Amer: 5 mL/min — ABNORMAL LOW (ref >60)
GFR, EST AFRICAN AMERICAN: 6 mL/min — AB (ref >60)
Glucose, Bld: 110 mg/dL — ABNORMAL HIGH (ref 65–99)
Potassium: 5 mmol/L (ref 3.5–5.1)
Sodium: 139 mmol/L (ref 135–145)
TOTAL PROTEIN: 7.6 g/dL (ref 6.5–8.1)

## 2018-02-27 LAB — CBC
HCT: 37.7 % (ref 35.0–47.0)
Hemoglobin: 12.3 g/dL (ref 12.0–16.0)
MCH: 35.7 pg — AB (ref 26.0–34.0)
MCHC: 32.5 g/dL (ref 32.0–36.0)
MCV: 109.8 fL — ABNORMAL HIGH (ref 80.0–100.0)
PLATELETS: 188 10*3/uL (ref 150–440)
RBC: 3.43 MIL/uL — AB (ref 3.80–5.20)
RDW: 15 % — ABNORMAL HIGH (ref 11.5–14.5)
WBC: 7.7 10*3/uL (ref 3.6–11.0)

## 2018-02-27 LAB — URINALYSIS, COMPLETE (UACMP) WITH MICROSCOPIC
BILIRUBIN URINE: NEGATIVE
Bacteria, UA: NONE SEEN
Glucose, UA: 50 mg/dL — AB
Ketones, ur: NEGATIVE mg/dL
NITRITE: NEGATIVE
PH: 8 (ref 5.0–8.0)
Protein, ur: 100 mg/dL — AB
SPECIFIC GRAVITY, URINE: 1.01 (ref 1.005–1.030)
WBC, UA: >50 WBC/hpf — ABNORMAL HIGH (ref 0–5)

## 2018-02-27 LAB — TROPONIN I: TROPONIN I: 0.04 ng/mL — AB (ref <0.03)

## 2018-02-27 LAB — ETHANOL: Alcohol, Ethyl (B): <10 mg/dL (ref <10)

## 2018-02-27 LAB — LACTIC ACID, PLASMA: LACTIC ACID, VENOUS: 1.5 mmol/L (ref 0.5–1.9)

## 2018-02-27 MED ORDER — SODIUM CHLORIDE 0.9 % IV SOLN
1.0000 g | Freq: Once | INTRAVENOUS | Status: AC
  Administered 2018-02-28: 1 g via INTRAVENOUS
  Filled 2018-02-27: qty 10

## 2018-02-27 NOTE — ED Notes (Signed)
Date and time results received: 02/27/18 2343 (use smartphrase ".now" to insert current time)  Test: Troponin Critical Value: 0.04  Name of Provider Notified: Dr. Mable Paris  Orders Received? Or Actions Taken?:

## 2018-02-27 NOTE — ED Triage Notes (Signed)
Pt arrived from home with complaints of altered mental status according to her family. They stated that she was lethargic this morning but then perked up after eating breakfast. Pt is on Dialysis and has a fistula in her left arm. Yesterday was her last treatment. Pt has a Hx of diabetes and stroke. EMS stated that she was A-fib on the monitor with a few PVCs. Pt was treated a month ago for a UTI. Pt was treated 2 days ago for a fall in her bedroom on carpet. She was given some pain meds and sent home. VS per EMS BP-80/40 HR-74 O2sat-97%RA EMS stated that she told them "it is normally low". Pt denies and vomiting or diarrhea. Pt is only oriented to her name at this moment.

## 2018-02-27 NOTE — ED Provider Notes (Signed)
[facility] Emergency Department Provider Note ____________________________________________   First MD Initiated Contact with Patient 02/27/18 2046     (approximate)  I have reviewed the triage vital signs and the nursing notes.   HISTORY  Chief Complaint No chief complaint on file.  Level 5 caveat: History of present illness severely limited due to altered mental status  HPI Laurie Steele is a 77 y.o. female with PMH as noted below who presents with altered mental status.  Per EMS, the patient was noted by family to be lethargic this morning and then her mental status improved after breakfast.  She then became more confused during the evening.  The patient herself is not able to give any complaint at this time.  Past Medical History:  Diagnosis Date  . Anxiety   . CAD (coronary artery disease)   . Chronic anemia    Dr Inez Pilgrim  . Dysrhythmia   . GERD (gastroesophageal reflux disease)   . Glaucoma   . Gout   . Gout   . Hx of colonic polyps   . Hyperlipidemia   . Hypertension   . Lower back pain   . Miscarriage   . Myocardial infarction ([place])   . 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 Neuropathy   . 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 Peritoneal dialysis status ([place])   . Renal failure    Dr Holley Raring  . Renal insufficiency   . 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 Vaginal delivery    x 4    Patient Active Problem List   Diagnosis Date Noted  . Complication of vascular access for dialysis 07/18/2017  . UTI (urinary tract infection) 01/22/2017  . CHF (congestive heart failure) ([place]) 12/11/2016  . Ds DNA antibody positive 07/09/2016  . Pneumonia 07/03/2016  . Anemia 06/17/2016  . Symptomatic anemia 06/15/2016  . Hypoglycemia 05/21/2016  . Chest pain 04/05/2016  . Hypercalcemia 03/14/2016  . Bronchitis 03/03/2012  . Type II or  unspecified type diabetes mellitus with renal manifestations, not stated as uncontrolled(250.40) 12/18/2011  . END STAGE RENAL DISEASE 10/23/2010  . DM (diabetes mellitus), type 2 ([place]) 12/13/2009  . NEUROPATHY 12/13/2009  . GOUT 02/15/2009  . SLEEP DISORDER 02/15/2009  . GERD 02/23/2008  . Hyperlipidemia 05/14/2007  . ANEMIA, CHRONIC 05/14/2007  . Essential hypertension 05/14/2007    Past Surgical History:  Procedure Laterality Date  . ABDOMINAL HYSTERECTOMY  1983  . AV FISTULA PLACEMENT Left 09/21/2016   Procedure: ARTERIOVENOUS (AV) FISTULA CREATION ( BRACHIOCEPHALIC );  Surgeon: Katha Cabal, MD;  Location: [facility] ORS;  Service: Vascular;  Laterality: Left;  . BACK SURGERY  6/08   Dr Collier Salina  . BACK SURGERY  1987   disc  . CHOLECYSTECTOMY    . DIALYSIS/PERMA CATHETER REMOVAL N/A 02/25/2017   Procedure: Dialysis/Perma Catheter Removal;  Surgeon: Algernon Huxley, MD;  Location: [place] [facility] LAB;  Service: Cardiovascular;  Laterality: N/A;  . INSERTION OF DIALYSIS CATHETER  2017  . PERITONEAL CATHETER INSERTION    . REMOVAL OF A DIALYSIS CATHETER N/A 09/21/2016   Procedure: REMOVAL OF A DIALYSIS CATHETER ( PERITONEAL DIALYSIS CATH );  Surgeon: Katha Cabal, MD;  Location: [facility] ORS;  Service: Vascular;  Laterality: N/A;  . VITRECTOMY AND CATARACT Left 11/04/2017   Procedure: VITRECTOMY AND CATARACT;  Surgeon: Leandrew Koyanagi, MD;  Location: [facility] ORS;  Service: Ophthalmology;  Laterality: Left;  US00:51.6 AP%21.8 GGY69.48 FLUID LOT #[id number] H    Prior to Admission medications   Medication Sig Start Date End Date Taking? Authorizing Provider  acetaminophen (TYLENOL) 650 MG CR tablet Take 650 mg by mouth every 8 (eight) hours as needed for pain.    [provider]  amitriptyline (ELAVIL) 25 MG tablet Take 25 mg by mouth at bedtime as needed (foot pain).    [provider]  amLODipine (NORVASC) 10 MG tablet Take 10 mg by mouth daily. 11/03/16    [provider]  aspirin EC 81 MG tablet Take 1 tablet (81 mg total) by mouth daily. 06/18/16   Fritzi Mandes, MD  baclofen (LIORESAL) 10 MG tablet Take 1 tablet (10 mg total) by mouth 3 (three) times daily. 02/26/18   Triplett, Johnette Abraham B, FNP  cinacalcet (SENSIPAR) 90 MG tablet Take 90 mg by mouth daily. With lunch    [provider]  cloNIDine (CATAPRES) 0.1 MG tablet Take 0.1 mg by mouth See admin instructions. tk 1 tab po bid on sun, mon, wed, and fri, then tk 1 tab qhs on tues, thurs, and sat 11/08/16   [provider]  clopidogrel (PLAVIX) 75 MG tablet Take 1 tablet (75 mg total) by mouth daily. 01/26/17   Nicholes Mango, MD  colchicine 0.6 MG tablet Take 0.5 tablets (0.3 mg total) by mouth 2 (two) times a week. Patient taking differently: Take 0.6 mg by mouth 2 (two) times daily.  07/09/16   Demetrios Loll, MD  dextromethorphan-guaiFENesin [facility] DM) 30-600 MG 12hr tablet Take 1 tablet by mouth 2 (two) times daily.    [provider]  diclofenac sodium (VOLTAREN) 1 % GEL Apply 2 g four times a day PRN Not a candidate for oral NSAIDS, ESRD on PD 06/27/16   [provider]  docusate sodium (COLACE) 100 MG capsule Take 1 capsule (100 mg total) by mouth daily as needed for mild constipation. 12/13/16   Loletha Grayer, MD  famotidine (PEPCID) 20 MG tablet Take 20 mg by mouth at bedtime.  11/29/16   [provider]  feeding supplement, ENSURE ENLIVE, (ENSURE ENLIVE) LIQD Take 237 mLs by mouth 2 (two) times daily between meals. 01/25/17   Max Sane, MD  ferrous sulfate 325 (65 FE) MG tablet Take 1 tablet (325 mg total) by mouth daily with breakfast. 06/18/16   Fritzi Mandes, MD  fluticasone [facility]) 50 MCG/ACT nasal spray Place 2 sprays into both nostrils daily.     [provider]  furosemide (LASIX) 40 MG tablet Take 1 tablet (40 mg total) by mouth daily. Patient taking differently: Take 40 mg by mouth daily. Tk 1 tab qd on Sunday, Monday, Wednesday, and  Friday 07/05/16   Demetrios Loll, MD  gabapentin (NEURONTIN) 100 MG capsule Take 1 capsule (100 mg total) by mouth at bedtime. 07/05/16   Demetrios Loll, MD  glimepiride (AMARYL) 1 MG tablet Take 1 mg by mouth daily with breakfast.    [provider]  hydrALAZINE (APRESOLINE) 50 MG tablet Take 50 mg by mouth See admin instructions. Take 1 tab twice daily on Sunday, Monday, Wednesday and Friday. Take 1 tab at bedtime(on dialysis days) Tuesday, Thursday, and saturday 11/29/16   [provider]  insulin aspart (NOVOLOG) 100 UNIT/ML injection Inject 0-5 Units into the skin at bedtime. Patient not taking: Reported on 11/04/2017 05/28/16   Fritzi Mandes, MD  ipratropium (ATROVENT) 0.03 % nasal spray Place 1 spray  into both nostrils 3 (three) times daily.     [provider]  ipratropium-albuterol (DUONEB) 0.5-2.5 (3) MG/3ML SOLN Take 3 mLs by nebulization every 6 (six) hours as needed. 12/14/16   Gladstone Lighter, MD  isosorbide mononitrate (IMDUR) 60 MG 24 hr tablet Take 30 mg by mouth 2 (two) times daily. 11/03/16   [provider]  latanoprost (XALATAN) 0.005 % ophthalmic solution Place 1 drop into both eyes at bedtime. 03/20/16   [provider]  metoprolol succinate (TOPROL-XL) 25 MG 24 hr tablet Take 1 tablet (25 mg total) by mouth at bedtime. 12/13/16   Loletha Grayer, MD  multivitamin (RENA-VIT) TABS tablet Take 1 tablet by mouth daily.    [provider]  ondansetron (ZOFRAN ODT) 4 MG disintegrating tablet Take 1 tablet (4 mg total) by mouth every 8 (eight) hours as needed for nausea or vomiting. Patient not taking: Reported on 07/11/2017 04/04/17   Darel Hong, MD  pantoprazole (PROTONIX) 40 MG tablet Take 1 tablet (40 mg total) by mouth daily. 06/18/16   Fritzi Mandes, MD  pravastatin (PRAVACHOL) 40 MG tablet Take 40 mg by mouth at bedtime.  06/21/15   [provider]    Allergies Alprazolam  Family History  Problem Relation Age of Onset  .  Heart attack Father   . Hypertension Mother   . Hyperlipidemia Mother   . Coronary artery disease Unknown        strong fam hx  . Kidney failure Brother   . Breast cancer Neg Hx     Social History Social History   Tobacco Use  . Smoking status: Former Smoker    Last attempt to quit: 11/05/1990    Years since quitting: 27.3  . Smokeless tobacco: Never Used  Substance Use Topics  . Alcohol use: No  . Drug use: No    Review of Systems Level 5 caveat: Unable to obtain review of systems due to altered mental status    ____________________________________________   PHYSICAL EXAM:  VITAL SIGNS: ED Triage Vitals  Enc Vitals Group     BP 02/27/18 2049 (!) 191/71     Pulse Rate 02/27/18 2049 75     Resp 02/27/18 2049 16     Temp 02/27/18 2049 98.6 F (37 C)     Temp Source 02/27/18 2049 Oral     SpO2 02/27/18 2049 93 %     Weight 02/27/18 2050 170 lb (77.1 kg)     Height 02/27/18 2050 5\' 7"  (1.702 m)     Head Circumference --      Peak Flow --      Pain Score 02/27/18 2050 0     Pain Loc --      Pain Edu? --      Excl. in [place]? --     Constitutional: Alert, confused.  Relatively comfortable appearing. Eyes: Conjunctivae are normal.  EOMI.  PERRLA. Head: Atraumatic. Nose: No congestion/rhinnorhea. Mouth/Throat: Mucous membranes are somewhat dry.   Neck: Normal range of motion.  Cardiovascular: Normal rate, regular rhythm. Grossly normal heart sounds.  Good peripheral circulation. Respiratory: Normal respiratory effort.  No retractions. Lungs CTAB. Gastrointestinal: Soft and nontender. No distention.  Genitourinary: No flank tenderness. Musculoskeletal: No lower extremity edema.  Extremities warm and well perfused.  Neurologic:  Normal speech.  Motor intact in all extremities. Skin:  Skin is warm and dry. No rash noted. Psychiatric: Unable to assess due to altered mental status.  ____________________________________________   LABS (all labs ordered  are listed,  but only abnormal results are displayed)  Labs Reviewed  COMPREHENSIVE METABOLIC PANEL - Abnormal; Notable for the following components:      Result Value   Chloride 96 (*)    Glucose, Bld 110 (*)    BUN 49 (*)    Creatinine, Ser 6.68 (*)    Calcium 8.1 (*)    Albumin 3.4 (*)    AST 45 (*)    Alkaline Phosphatase 165 (*)    GFR calc non Af Amer 5 (*)    GFR calc Af Amer 6 (*)    Anion gap 17 (*)    All other components within normal limits  CBC - Abnormal; Notable for the following components:   RBC 3.43 (*)    MCV 109.8 (*)    MCH 35.7 (*)    RDW 15.0 (*)    All other components within normal limits  TROPONIN I - Abnormal; Notable for the following components:   Troponin I 0.04 (*)    All other components within normal limits  URINALYSIS, COMPLETE (UACMP) WITH MICROSCOPIC - Abnormal; Notable for the following components:   Color, Urine YELLOW (*)    APPearance CLOUDY (*)    Glucose, UA 50 (*)    Hgb urine dipstick SMALL (*)    Protein, ur 100 (*)    Leukocytes, UA LARGE (*)    WBC, UA >50 (*)    All other components within normal limits  ETHANOL  LACTIC ACID, PLASMA  CBG MONITORING, ED   ____________________________________________  EKG  ED ECG REPORT I, Arta Silence, the attending physician, personally viewed and interpreted this ECG.  Date: 02/27/2018 EKG Time: 2049 Rate: 72 Rhythm: normal sinus rhythm QRS Axis: normal Intervals: Prolonged QT ST/T Wave abnormalities: normal Narrative Interpretation: no evidence of acute ischemia  ____________________________________________  RADIOLOGY  CXR: Bibasilar atelectasis and cardiomegaly CT had no ICH or other acute findings  ____________________________________________   PROCEDURES  Procedure(s) performed: No  Procedures  Critical Care performed: No ____________________________________________   INITIAL IMPRESSION / ASSESSMENT AND PLAN / ED COURSE  Pertinent labs & imaging results that  were available during my care of the patient were reviewed by me and considered in my medical decision making (see chart for details).  77 year old female with PMH as noted above including ESRD on dialysis presents with altered mental status over the course the day today, with no other specific history given by EMS.  Patient's family has not yet arrived.  I reviewed the past medical records in Epic; the patient was evaluated 1 month ago for weakness and dizziness, and diagnosed with likely UTI.  She was also seen in the ED yesterday after a fall which occurred 2 days prior and had negative imaging.  On exam, the patient is alert but confused.  When asked her name, she was able to tell me but then repeated her name when answering all my other questions.  There is no evidence of trauma, the neuro exam is nonfocal, and the remainder the exam is as described above.  Differential includes infection, dehydration or other metabolic etiology, or less likely CNS cause.  Plan for infection work-up, CT head, labs, and reassess.  Anticipate likely admission.  ----------------------------------------- 11:09 PM on 02/27/2018 -----------------------------------------  UA is consistent with UTI which is a likely etiology of patient's symptoms, however most of her other labs are still pending.  Plan for admission.  I signed the patient out to the hospitalist Dr. Jodell Cipro.  ____________________________________________   FINAL CLINICAL IMPRESSION(S) / ED DIAGNOSES  Final diagnoses:  Urinary tract infection without hematuria, site unspecified  Delirium      NEW MEDICATIONS STARTED DURING THIS VISIT:  New Prescriptions   No medications on file     Note:  This document was prepared using Dragon voice recognition software and may include unintentional dictation errors.    Arta Silence, MD 02/28/18 [phone]

## 2018-02-28 ENCOUNTER — Other Ambulatory Visit: Payer: Self-pay

## 2018-02-28 ENCOUNTER — Inpatient Hospital Stay: Payer: Medicare Other

## 2018-02-28 ENCOUNTER — Encounter: Payer: Self-pay | Admitting: Internal Medicine

## 2018-02-28 DIAGNOSIS — W010XXA Fall on same level from slipping, tripping and stumbling without subsequent striking against object, initial encounter: Secondary | ICD-10-CM | POA: Diagnosis present

## 2018-02-28 DIAGNOSIS — N186 End stage renal disease: Secondary | ICD-10-CM | POA: Diagnosis present

## 2018-02-28 DIAGNOSIS — N2581 Secondary hyperparathyroidism of renal origin: Secondary | ICD-10-CM | POA: Diagnosis present

## 2018-02-28 DIAGNOSIS — E1121 Type 2 diabetes mellitus with diabetic nephropathy: Secondary | ICD-10-CM | POA: Diagnosis present

## 2018-02-28 DIAGNOSIS — E785 Hyperlipidemia, unspecified: Secondary | ICD-10-CM | POA: Diagnosis present

## 2018-02-28 DIAGNOSIS — K219 Gastro-esophageal reflux disease without esophagitis: Secondary | ICD-10-CM | POA: Diagnosis not present

## 2018-02-28 DIAGNOSIS — E1151 Type 2 diabetes mellitus with diabetic peripheral angiopathy without gangrene: Secondary | ICD-10-CM | POA: Diagnosis present

## 2018-02-28 DIAGNOSIS — E11319 Type 2 diabetes mellitus with unspecified diabetic retinopathy without macular edema: Secondary | ICD-10-CM | POA: Diagnosis present

## 2018-02-28 DIAGNOSIS — N390 Urinary tract infection, site not specified: Secondary | ICD-10-CM | POA: Diagnosis present

## 2018-02-28 DIAGNOSIS — G92 Toxic encephalopathy: Secondary | ICD-10-CM | POA: Diagnosis present

## 2018-02-28 DIAGNOSIS — F419 Anxiety disorder, unspecified: Secondary | ICD-10-CM | POA: Diagnosis present

## 2018-02-28 DIAGNOSIS — E1165 Type 2 diabetes mellitus with hyperglycemia: Secondary | ICD-10-CM | POA: Diagnosis present

## 2018-02-28 DIAGNOSIS — W19XXXA Unspecified fall, initial encounter: Secondary | ICD-10-CM | POA: Diagnosis present

## 2018-02-28 DIAGNOSIS — R4182 Altered mental status, unspecified: Secondary | ICD-10-CM | POA: Diagnosis present

## 2018-02-28 DIAGNOSIS — I132 Hypertensive heart and chronic kidney disease with heart failure and with stage 5 chronic kidney disease, or end stage renal disease: Secondary | ICD-10-CM | POA: Diagnosis present

## 2018-02-28 DIAGNOSIS — I252 Old myocardial infarction: Secondary | ICD-10-CM | POA: Diagnosis not present

## 2018-02-28 DIAGNOSIS — Y9301 Activity, walking, marching and hiking: Secondary | ICD-10-CM | POA: Diagnosis present

## 2018-02-28 DIAGNOSIS — Y92008 Other place in unspecified non-institutional (private) residence as the place of occurrence of the external cause: Secondary | ICD-10-CM | POA: Diagnosis not present

## 2018-02-28 DIAGNOSIS — I248 Other forms of acute ischemic heart disease: Secondary | ICD-10-CM | POA: Diagnosis present

## 2018-02-28 DIAGNOSIS — I2510 Atherosclerotic heart disease of native coronary artery without angina pectoris: Secondary | ICD-10-CM | POA: Diagnosis present

## 2018-02-28 DIAGNOSIS — H409 Unspecified glaucoma: Secondary | ICD-10-CM | POA: Diagnosis present

## 2018-02-28 DIAGNOSIS — M109 Gout, unspecified: Secondary | ICD-10-CM | POA: Diagnosis present

## 2018-02-28 DIAGNOSIS — D539 Nutritional anemia, unspecified: Secondary | ICD-10-CM | POA: Diagnosis present

## 2018-02-28 DIAGNOSIS — Y92009 Unspecified place in unspecified non-institutional (private) residence as the place of occurrence of the external cause: Secondary | ICD-10-CM | POA: Diagnosis not present

## 2018-02-28 DIAGNOSIS — Z87891 Personal history of nicotine dependence: Secondary | ICD-10-CM | POA: Diagnosis not present

## 2018-02-28 DIAGNOSIS — E1122 Type 2 diabetes mellitus with diabetic chronic kidney disease: Secondary | ICD-10-CM | POA: Diagnosis present

## 2018-02-28 DIAGNOSIS — T428X5A Adverse effect of antiparkinsonism drugs and other central muscle-tone depressants, initial encounter: Secondary | ICD-10-CM | POA: Diagnosis present

## 2018-02-28 DIAGNOSIS — D631 Anemia in chronic kidney disease: Secondary | ICD-10-CM | POA: Diagnosis present

## 2018-02-28 LAB — RENAL FUNCTION PANEL
ALBUMIN: 3 g/dL — AB (ref 3.5–5.0)
ANION GAP: 12 (ref 5–15)
BUN: 56 mg/dL — ABNORMAL HIGH (ref 6–20)
CALCIUM: 7.5 mg/dL — AB (ref 8.9–10.3)
CO2: 29 mmol/L (ref 22–32)
CREATININE: 7.69 mg/dL — AB (ref 0.44–1.00)
Chloride: 97 mmol/L — ABNORMAL LOW (ref 101–111)
GFR calc non Af Amer: 4 mL/min — ABNORMAL LOW (ref >60)
GFR, EST AFRICAN AMERICAN: 5 mL/min — AB (ref >60)
Glucose, Bld: 108 mg/dL — ABNORMAL HIGH (ref 65–99)
PHOSPHORUS: 5.8 mg/dL — AB (ref 2.5–4.6)
Potassium: 3.7 mmol/L (ref 3.5–5.1)
SODIUM: 138 mmol/L (ref 135–145)

## 2018-02-28 LAB — CBC
HCT: 32.5 % — ABNORMAL LOW (ref 35.0–47.0)
HEMOGLOBIN: 10.7 g/dL — AB (ref 12.0–16.0)
MCH: 36 pg — AB (ref 26.0–34.0)
MCHC: 33 g/dL (ref 32.0–36.0)
MCV: 109.1 fL — ABNORMAL HIGH (ref 80.0–100.0)
PLATELETS: 185 10*3/uL (ref 150–440)
RBC: 2.98 MIL/uL — AB (ref 3.80–5.20)
RDW: 14.6 % — ABNORMAL HIGH (ref 11.5–14.5)
WBC: 6.5 10*3/uL (ref 3.6–11.0)

## 2018-02-28 LAB — TROPONIN I
TROPONIN I: 0.04 ng/mL — AB (ref <0.03)
Troponin I: 0.04 ng/mL (ref <0.03)
Troponin I: 0.04 ng/mL (ref <0.03)

## 2018-02-28 LAB — BLOOD GAS, ARTERIAL
ACID-BASE EXCESS: 5.8 mmol/L — AB (ref 0.0–2.0)
BICARBONATE: 30.6 mmol/L — AB (ref 20.0–28.0)
FIO2: 0.21
O2 Saturation: 97.1 %
PATIENT TEMPERATURE: 37
PH ART: 7.45 (ref 7.350–7.450)
pCO2 arterial: 44 mmHg (ref 32.0–48.0)
pO2, Arterial: 87 mmHg (ref 83.0–108.0)

## 2018-02-28 LAB — URINE DRUG SCREEN, QUALITATIVE (ARMC ONLY)
AMPHETAMINES, UR SCREEN: NOT DETECTED
BARBITURATES, UR SCREEN: NOT DETECTED
BENZODIAZEPINE, UR SCRN: NOT DETECTED
Cannabinoid 50 Ng, Ur ~~LOC~~: NOT DETECTED
Cocaine Metabolite,Ur ~~LOC~~: NOT DETECTED
MDMA (Ecstasy)Ur Screen: NOT DETECTED
Methadone Scn, Ur: NOT DETECTED
OPIATE, UR SCREEN: NOT DETECTED
PHENCYCLIDINE (PCP) UR S: NOT DETECTED
Tricyclic, Ur Screen: NOT DETECTED

## 2018-02-28 LAB — AMMONIA
AMMONIA: 20 umol/L (ref 9–35)
AMMONIA: 29 umol/L (ref 9–35)

## 2018-02-28 LAB — GLUCOSE, CAPILLARY
GLUCOSE-CAPILLARY: 113 mg/dL — AB (ref 65–99)
GLUCOSE-CAPILLARY: 110 mg/dL — AB (ref 65–99)
Glucose-Capillary: 107 mg/dL — ABNORMAL HIGH (ref 65–99)

## 2018-02-28 LAB — FOLATE: Folate: 32 ng/mL (ref >5.9)

## 2018-02-28 LAB — VITAMIN B12: Vitamin B-12: 665 pg/mL (ref 180–914)

## 2018-02-28 LAB — TSH: TSH: 2.363 u[IU]/mL (ref 0.350–4.500)

## 2018-02-28 MED ORDER — PRAVASTATIN SODIUM 20 MG PO TABS
40.0000 mg | ORAL_TABLET | Freq: Every day | ORAL | Status: DC
  Administered 2018-03-03: 40 mg via ORAL
  Filled 2018-02-28: qty 2

## 2018-02-28 MED ORDER — HYDRALAZINE HCL 20 MG/ML IJ SOLN
10.0000 mg | Freq: Four times a day (QID) | INTRAMUSCULAR | Status: DC
Start: 2018-03-01 — End: 2018-03-04
  Administered 2018-02-28 – 2018-03-03 (×11): 10 mg via INTRAVENOUS
  Filled 2018-02-28 (×11): qty 1

## 2018-02-28 MED ORDER — SODIUM CHLORIDE 0.9 % IV SOLN
1.0000 g | INTRAVENOUS | Status: DC
  Administered 2018-02-28 – 2018-03-03 (×3): 1 g via INTRAVENOUS
  Filled 2018-02-28 (×4): qty 10

## 2018-02-28 MED ORDER — INSULIN ASPART 100 UNIT/ML ~~LOC~~ SOLN
0.0000 [IU] | Freq: Three times a day (TID) | SUBCUTANEOUS | Status: DC
  Administered 2018-03-03: 3 [IU] via SUBCUTANEOUS
  Administered 2018-03-04 (×2): 2 [IU] via SUBCUTANEOUS
  Filled 2018-02-28 (×3): qty 1

## 2018-02-28 MED ORDER — ACETAMINOPHEN 325 MG PO TABS: 650.0000 mg | ORAL_TABLET | Freq: Four times a day (QID) | ORAL | Status: DC | PRN

## 2018-02-28 MED ORDER — HYDRALAZINE HCL 50 MG PO TABS
50.0000 mg | ORAL_TABLET | ORAL | Status: DC
  Filled 2018-02-28: qty 1

## 2018-02-28 MED ORDER — CLONIDINE HCL 0.1 MG PO TABS: 0.1000 mg | ORAL_TABLET | ORAL | Status: DC

## 2018-02-28 MED ORDER — ASPIRIN EC 81 MG PO TBEC
81.0000 mg | DELAYED_RELEASE_TABLET | Freq: Every day | ORAL | Status: DC
  Administered 2018-03-04: 81 mg via ORAL
  Filled 2018-02-28: qty 1

## 2018-02-28 MED ORDER — MIRTAZAPINE 15 MG PO TABS
15.0000 mg | ORAL_TABLET | Freq: Every day | ORAL | Status: DC
  Administered 2018-03-03: 15 mg via ORAL
  Filled 2018-02-28: qty 1

## 2018-02-28 MED ORDER — FUROSEMIDE 40 MG PO TABS
40.0000 mg | ORAL_TABLET | Freq: Every day | ORAL | Status: DC
  Administered 2018-03-04: 10:00:00 40 mg via ORAL
  Filled 2018-02-28: qty 1

## 2018-02-28 MED ORDER — IPRATROPIUM-ALBUTEROL 0.5-2.5 (3) MG/3ML IN SOLN: 3.0000 mL | Freq: Four times a day (QID) | RESPIRATORY_TRACT | Status: DC | PRN

## 2018-02-28 MED ORDER — ONDANSETRON HCL 4 MG PO TABS: 4.0000 mg | ORAL_TABLET | Freq: Four times a day (QID) | ORAL | Status: DC | PRN

## 2018-02-28 MED ORDER — HYDRALAZINE HCL 20 MG/ML IJ SOLN: 10.0000 mg | Freq: Every day | INTRAMUSCULAR | Status: DC

## 2018-02-28 MED ORDER — ORAL CARE MOUTH RINSE
15.0000 mL | Freq: Two times a day (BID) | OROMUCOSAL | Status: DC
  Administered 2018-03-01 – 2018-03-02 (×3): 15 mL via OROMUCOSAL

## 2018-02-28 MED ORDER — CINACALCET HCL 30 MG PO TABS
90.0000 mg | ORAL_TABLET | Freq: Every day | ORAL | Status: DC
  Filled 2018-02-28 (×5): qty 3

## 2018-02-28 MED ORDER — CLONIDINE HCL 0.1 MG/24HR TD PTWK
0.1000 mg | MEDICATED_PATCH | TRANSDERMAL | Status: DC
  Administered 2018-02-28: 0.1 mg via TRANSDERMAL
  Filled 2018-02-28: qty 1

## 2018-02-28 MED ORDER — CHLORHEXIDINE GLUCONATE 0.12 % MT SOLN
15.0000 mL | Freq: Two times a day (BID) | OROMUCOSAL | Status: DC
  Administered 2018-02-28 – 2018-03-04 (×5): 15 mL via OROMUCOSAL
  Filled 2018-02-28 (×5): qty 15

## 2018-02-28 MED ORDER — BISACODYL 5 MG PO TBEC
5.0000 mg | DELAYED_RELEASE_TABLET | Freq: Every day | ORAL | Status: DC | PRN
  Administered 2018-03-04: 10:00:00 5 mg via ORAL
  Filled 2018-02-28: qty 1

## 2018-02-28 MED ORDER — GABAPENTIN 100 MG PO CAPS: 200.0000 mg | ORAL_CAPSULE | Freq: Three times a day (TID) | ORAL | Status: DC

## 2018-02-28 MED ORDER — FAMOTIDINE 20 MG PO TABS: 20.0000 mg | ORAL_TABLET | Freq: Every day | ORAL | Status: DC

## 2018-02-28 MED ORDER — HEPARIN SODIUM (PORCINE) 5000 UNIT/ML IJ SOLN
5000.0000 [IU] | Freq: Three times a day (TID) | INTRAMUSCULAR | Status: DC
  Filled 2018-02-28: qty 1

## 2018-02-28 MED ORDER — ENSURE ENLIVE PO LIQD: 237.0000 mL | Freq: Two times a day (BID) | ORAL | Status: DC

## 2018-02-28 MED ORDER — PANTOPRAZOLE SODIUM 40 MG PO TBEC
40.0000 mg | DELAYED_RELEASE_TABLET | Freq: Every day | ORAL | Status: DC
  Administered 2018-03-04: 40 mg via ORAL
  Filled 2018-02-28: qty 1

## 2018-02-28 MED ORDER — FERROUS SULFATE 325 (65 FE) MG PO TABS
325.0000 mg | ORAL_TABLET | Freq: Every day | ORAL | Status: DC
  Administered 2018-03-04: 325 mg via ORAL
  Filled 2018-02-28: qty 1

## 2018-02-28 MED ORDER — COLCHICINE 0.6 MG PO TABS
0.3000 mg | ORAL_TABLET | ORAL | Status: DC
  Filled 2018-02-28: qty 0.5

## 2018-02-28 MED ORDER — RENA-VITE PO TABS
1.0000 | ORAL_TABLET | Freq: Every day | ORAL | Status: DC
  Administered 2018-03-04: 1 via ORAL
  Filled 2018-02-28 (×5): qty 1

## 2018-02-28 MED ORDER — AMLODIPINE BESYLATE 10 MG PO TABS
10.0000 mg | ORAL_TABLET | Freq: Every day | ORAL | Status: DC
  Administered 2018-03-04: 10 mg via ORAL
  Filled 2018-02-28: qty 1

## 2018-02-28 MED ORDER — NALOXONE HCL 0.4 MG/ML IJ SOLN
0.4000 mg | Freq: Once | INTRAMUSCULAR | Status: AC
  Administered 2018-02-28: 0.4 mg via INTRAVENOUS
  Filled 2018-02-28: qty 1

## 2018-02-28 MED ORDER — HYDRALAZINE HCL 50 MG PO TABS: 50.0000 mg | ORAL_TABLET | ORAL | Status: DC

## 2018-02-28 MED ORDER — METOPROLOL SUCCINATE ER 25 MG PO TB24
25.0000 mg | ORAL_TABLET | Freq: Every day | ORAL | Status: DC
  Administered 2018-03-03: 25 mg via ORAL
  Filled 2018-02-28: qty 1

## 2018-02-28 MED ORDER — LATANOPROST 0.005 % OP SOLN
1.0000 [drp] | Freq: Every day | OPHTHALMIC | Status: DC
  Administered 2018-02-28 – 2018-03-03 (×4): 1 [drp] via OPHTHALMIC
  Filled 2018-02-28: qty 2.5

## 2018-02-28 MED ORDER — ONDANSETRON HCL 4 MG/2ML IJ SOLN
4.0000 mg | Freq: Four times a day (QID) | INTRAMUSCULAR | Status: DC | PRN
  Administered 2018-03-03: 17:00:00 4 mg via INTRAVENOUS
  Filled 2018-02-28: qty 2

## 2018-02-28 MED ORDER — ISOSORBIDE MONONITRATE ER 30 MG PO TB24
30.0000 mg | ORAL_TABLET | Freq: Two times a day (BID) | ORAL | Status: DC
  Administered 2018-03-04: 10:00:00 30 mg via ORAL
  Filled 2018-02-28: qty 1

## 2018-02-28 MED ORDER — CLOPIDOGREL BISULFATE 75 MG PO TABS
75.0000 mg | ORAL_TABLET | Freq: Every day | ORAL | Status: DC
  Administered 2018-03-04: 75 mg via ORAL
  Filled 2018-02-28: qty 1

## 2018-02-28 MED ORDER — AMITRIPTYLINE HCL 25 MG PO TABS: 25.0000 mg | ORAL_TABLET | Freq: Every evening | ORAL | Status: DC | PRN

## 2018-02-28 MED ORDER — ALLOPURINOL 100 MG PO TABS
200.0000 mg | ORAL_TABLET | Freq: Every day | ORAL | Status: DC
  Administered 2018-03-03: 200 mg via ORAL
  Filled 2018-02-28 (×6): qty 2

## 2018-02-28 MED ORDER — BRIMONIDINE TARTRATE 0.2 % OP SOLN
1.0000 [drp] | Freq: Three times a day (TID) | OPHTHALMIC | Status: DC
  Administered 2018-02-28 – 2018-03-04 (×9): 1 [drp] via OPHTHALMIC
  Filled 2018-02-28: qty 5

## 2018-02-28 MED ORDER — ACETAMINOPHEN 650 MG RE SUPP: 650.0000 mg | Freq: Four times a day (QID) | RECTAL | Status: DC | PRN

## 2018-02-28 MED ORDER — SENNOSIDES-DOCUSATE SODIUM 8.6-50 MG PO TABS: 1.0000 | ORAL_TABLET | Freq: Every evening | ORAL | Status: DC | PRN

## 2018-02-28 NOTE — Progress Notes (Signed)
Post HD Tx, no change in pt LOC, still able to squeeze my hand on command, no other interaction. Pt stable.    02/28/18 1850  Hand-Off documentation  Report given to (Full Name) Ok Edwards, RN   Report received from (Full Name) Beatris Ship, RN   Vital Signs  Temp 98.1 F (36.7 C)  Temp Source Oral  Pulse Rate 81  Pulse Rate Source Monitor  Resp 15  BP (!) 148/74  BP Location Right Arm  BP Method Automatic  Patient Position (if appropriate) Lying  Oxygen Therapy  SpO2 100 %  O2 Device Room Air  Post-Hemodialysis Assessment  Rinseback Volume (mL) 250 mL  Dialyzer Clearance Lightly streaked  Duration of HD Treatment -hour(s) 3.25 hour(s)  Hemodialysis Intake (mL) 500 mL  UF Total -Machine (mL) 2502 mL  Net UF (mL) 2002 mL  Tolerated HD Treatment Yes

## 2018-02-28 NOTE — Progress Notes (Signed)
[facility]  ROUNDING NOTE   Subjective:   Laurie Steele admitted to [facility] on 02/27/2018 for Delirium [R41.0] Urinary tract infection without hematuria, site unspecified [N39.0]  History taken from family. Patient was in her normal status of health 2 days ago. Last hemodialysis was on Wednesday. Tolerated treatment well.  Family states they noticed altered mental status yesterday that became progressive.   Got Baclofen from ED on 4/24. Unclear if patient took baclofen.   Objective:  Vital signs in last 24 hours:  Temp:  [97.6 F (36.4 C)-98.6 F (37 C)] 97.7 F (36.5 C) (04/26 0526) Pulse Rate:  [57-75] 62 (04/26 0526) Resp:  [12-19] 18 (04/26 0526) BP: (101-191)/(47-71) 147/56 (04/26 0526) SpO2:  [93 %-100 %] 98 % (04/26 0526) Weight:  [77.1 kg (170 lb)] 77.1 kg (170 lb) (04/25 2050)  Weight change:  Filed Weights   02/27/18 2050  Weight: 77.1 kg (170 lb)    Intake/Output: No intake/output data recorded.   Intake/Output this shift:  Total I/O In: -  Out: 200 [Urine:200]  Physical Exam: General: Lethargic, somnolent,   Head: Normocephalic, atraumatic. Moist oral mucosal membranes  Eyes: Pupils dilated and sluggish response to light  Neck: Supple, trachea midline  Lungs:  Clear to auscultation  Heart: Regular rate and rhythm  Abdomen:  Soft, nontender  Extremities: no peripheral edema.  Neurologic: Responds to verbal stimuli  Skin: No lesions  Access: Left AVF    Basic Metabolic Panel: Recent Labs  Lab 02/27/18 2106 02/28/18 0952  NA 139 138  K 5.0 3.7  CL 96* 97*  CO2 26 29  GLUCOSE 110* 108*  BUN 49* 56*  CREATININE 6.68* 7.69*  CALCIUM 8.1* 7.5*  PHOS  --  5.8*    Liver Function Tests: Recent Labs  Lab 02/27/18 2106 02/28/18 0952  AST 45*  --   ALT 45  --   ALKPHOS 165*  --   BILITOT 0.7  --   PROT 7.6  --   ALBUMIN 3.4* 3.0*   No results for input(s): LIPASE, AMYLASE in the last 168 hours. Recent Labs  Lab  02/28/18 0658 02/28/18 0953  AMMONIA 20 29    CBC: Recent Labs  Lab 02/27/18 2106 02/28/18 0952  WBC 7.7 6.5  HGB 12.3 10.7*  HCT 37.7 32.5*  MCV 109.8* 109.1*  PLT 188 185    Cardiac Enzymes: Recent Labs  Lab 02/27/18 2106 02/28/18 0952  TROPONINI 0.04* 0.04*    BNP: Invalid input(s): POCBNP  CBG: Recent Labs  Lab 02/28/18 0800  GLUCAP 113*    Microbiology: Results for orders placed or performed during the hospital encounter of 01/28/18  Urine culture     Status: None   Collection Time: 01/29/18 12:19 AM  Result Value Ref Range Status   Specimen Description   Final    URINE, RANDOM Performed at [facility], [address] [place], [place] [address]    Special Requests   Final    NONE Performed at [facility], [address], [place], [place] [address]    Culture   Final    NO GROWTH Performed at [facility], [place] [address], [place],  [address]    Report Status 01/30/2018 FINAL  Final    Coagulation Studies: No results for input(s): LABPROT, INR in the last 72 hours.  Urinalysis: Recent Labs    02/27/18 2106  COLORURINE YELLOW*  LABSPEC 1.010  PHURINE 8.0  GLUCOSEU 50*  HGBUR  SMALL*  BILIRUBINUR NEGATIVE  KETONESUR NEGATIVE  PROTEINUR 100*  NITRITE NEGATIVE  LEUKOCYTESUR LARGE*      Imaging: Dg Ribs Unilateral W/chest Right  Result Date: 02/26/2018 CLINICAL DATA:  77 y/o F; status post fall 2 days ago. Right upper anterior rib pain. EXAM: RIGHT RIBS AND CHEST - 3+ VIEW COMPARISON:  None. FINDINGS: No fracture or other bone lesions are seen involving the ribs. There is no evidence of pneumothorax or pleural effusion. Both lungs are clear. Heart size and mediastinal contours are within normal limits. IMPRESSION: Negative. Electronically Signed   By: Kristine Garbe M.D.   On: 02/26/2018 19:13   Dg Shoulder Right  Result Date: 02/26/2018 CLINICAL DATA:  Fall 2 days ago.  Right arm pain. EXAM:  RIGHT SHOULDER - 2+ VIEW COMPARISON:  None. FINDINGS: Degenerative changes in the [facility] joint with joint space narrowing and spurring. Glenohumeral joint is maintained. No acute bony abnormality. Specifically, no fracture, subluxation, or dislocation. Soft tissues are intact. IMPRESSION: Degenerative changes in the [facility] joint.  No acute bony abnormality. Electronically Signed   By: Rolm Baptise M.D.   On: 02/26/2018 19:03   Ct Head Wo Contrast  Result Date: 02/27/2018 CLINICAL DATA:  Altered mental status. EXAM: CT HEAD WITHOUT CONTRAST TECHNIQUE: Contiguous axial images were obtained from the base of the skull through the vertex without intravenous contrast. COMPARISON:  Brain MRI 05/15/2017 FINDINGS: Brain: No mass lesion, intraparenchymal hemorrhage or extra-axial collection. No evidence of acute cortical infarct. There is periventricular hypoattenuation compatible with chronic microvascular disease. Vascular: Atherosclerotic calcification of the vertebral and internal carotid arteries at the skull base. No hyperdense vessel. Skull: Normal visualized skull base, calvarium and extracranial soft tissues. Sinuses/Orbits: No sinus fluid levels or advanced mucosal thickening. No mastoid effusion. Normal orbits. IMPRESSION: Chronic small vessel disease without acute intracranial abnormality. Electronically Signed   By: Ulyses Jarred M.D.   On: 02/27/2018 21:36   Mr Brain Wo Contrast  Result Date: 02/28/2018 CLINICAL DATA:  Altered mental status.  Recent fall. EXAM: MRI HEAD WITHOUT CONTRAST TECHNIQUE: Multiplanar, multiecho pulse sequences of the brain and surrounding structures were obtained without intravenous contrast. COMPARISON:  CT head 02/27/2018.  MR head 01/25/2017. FINDINGS: Considerable artifact on the axial and coronal diffusion imaging scans. Some motion degradation on axial FLAIR and gradient sequence. Overall study diagnostic. Brain: Generalized atrophy. Chronic microvascular ischemic change. No acute  stroke, acute hemorrhage, mass lesion, or extra-axial fluid. Hydrocephalus ex vacuo. 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 Vascular: Flow voids are maintained.  RIGHT vertebral dominant. Skull and upper cervical spine: Grossly normal marrow signal. Suspected cervical spondylosis. Sinuses/Orbits: No sinus opacity.  Negative orbits. Other: Trace mastoid effusions.  No nasopharyngeal process. IMPRESSION: Atrophy and small vessel disease similar to priors. No acute intracranial findings. Electronically Signed   By: Staci Righter M.D.   On: 02/28/2018 12:41   Dg Chest Portable 1 View  Result Date: 02/27/2018 CLINICAL DATA:  Atrial fibrillation and weakness EXAM: PORTABLE CHEST 1 VIEW COMPARISON:  02/26/2018 FINDINGS: Stable cardiomegaly with aortic atherosclerosis. Low lung volumes with bibasilar atelectasis but no acute pulmonary edema, pneumonic consolidation, effusion or pneumothorax. Soft tissue calcifications about the right humerus consistent with rotator cuff calcific tendinopathy. Osteoarthritis of the right AC joint. Degenerative changes are seen along the dorsal spine. IMPRESSION: Stable cardiomegaly with aortic atherosclerosis. Bibasilar atelectasis with low lung volumes. No active pulmonary disease. Electronically Signed   By: Meredith Leeds.D.  On: 02/27/2018 21:35   Dg Hip Unilat W Or Wo Pelvis 2-3 Views Right  Result Date: 02/26/2018 CLINICAL DATA:  Right hip pain.  Fall. EXAM: DG HIP (WITH OR WITHOUT PELVIS) 2-3V RIGHT COMPARISON:  None. FINDINGS: Mild symmetric osteoarthritic changes in the hips with spurring. SI joints are symmetric and unremarkable. No acute bony abnormality. Specifically, no fracture, subluxation, or dislocation. IMPRESSION: No acute bony abnormality. Electronically Signed   By: Rolm Baptise M.D.   On: 02/26/2018 19:04     Medications:   . cefTRIAXone (ROCEPHIN)  IV     . allopurinol  200 mg Oral QHS  . amLODipine  10 mg Oral Daily  .  aspirin EC  81 mg Oral Daily  . brimonidine  1 drop Left Eye TID  . cinacalcet  90 mg Oral Q lunch  . [START ON 03/01/2018] cloNIDine  0.1 mg Oral 2 times per day on Sun Tue Thu Sat   And  . cloNIDine  0.1 mg Oral Q M,W,F-2000  . clopidogrel  75 mg Oral Daily  . [START ON 03/01/2018] colchicine  0.3 mg Oral Q Wed,Sat  . famotidine  20 mg Oral QHS  . feeding supplement (ENSURE ENLIVE)  237 mL Oral BID BM  . ferrous sulfate  325 mg Oral Q breakfast  . furosemide  40 mg Oral Daily  . heparin  5,000 Units Subcutaneous Q8H  . [START ON 03/01/2018] hydrALAZINE  50 mg Oral 2 times per day on Sun Tue Thu Sat   And  . hydrALAZINE  50 mg Oral Q M,W,F-2000  . insulin aspart  0-15 Units Subcutaneous TID WC  . isosorbide mononitrate  30 mg Oral BID  . latanoprost  1 drop Both Eyes QHS  . metoprolol succinate  25 mg Oral QHS  . mirtazapine  15 mg Oral QHS  . multivitamin  1 tablet Oral Daily  . pantoprazole  40 mg Oral Daily  . pravastatin  40 mg Oral QHS   acetaminophen **OR** acetaminophen, amitriptyline, bisacodyl, ipratropium-albuterol, ondansetron **OR** ondansetron (ZOFRAN) IV, senna-docusate  Assessment/ Plan:  Laurie Steele is a 77 y.o. black female with end stage renal disease on hemodialysis, coronary artery disease, hypertension, diabetes mellitus type II, GERD, glaucoma, gout, who presents to [facility] on 02/27/2018 for altered mental status and urinary tract infection.   [facility] MWF [address] 78.5kg L AVF  1. Altered mental status: most likely secondary to baclofen.  MRI pending.   2. End Stage Renal Disease: MWF - dialysis for later today.  - Baclofen is dialyzable.   3. Hypertension: blood pressure at goal. Hold PO medications for now.   4. Secondary Hyperparathyroidism with hypocalcemia and hyperphosphatemia: outpatient PTH 862, calcium 7.5 and phos 6.3 - resume binders when able to take PO.   5. Anemia of chronic kidney disease: hemoglobin 10.7 - EPO as outpatient.     LOS: 0 Tomasita Beevers 4/26/20191:29 PM

## 2018-02-28 NOTE — H&P (Signed)
[place] [facility] [place] NAME: Laurie Steele    MR#:  [id number]  DATE OF BIRTH:  [birth year]  DATE OF ADMISSION:  02/27/2018  PRIMARY CARE PHYSICIAN: [name], MD   REQUESTING/REFERRING PHYSICIAN: Arta Silence, MD  CHIEF COMPLAINT:  No chief complaint on file.  AMS  HISTORY OF PRESENT ILLNESS:  Laurie Steele  is a 77 y.o. female with a known history of T2NIDDM (w/ nephropathy + neuropathy), HTN, HLD, CAD, PVD, ESRD (HD M/W/F via LUE AVF), GERD, gout, glaucoma, anemia (of chronic disease), anxiety who p/w 1d Hx AMS. Pt w/ AMS at the time of Hx/examination, is non-verbal and does not respond to questions or follow commands. She is not lethargic/obtunded at the time of my examination. She opens her eyes, is alert when aroused, and withdraws from noxious stimuli. She is not agitated or combative, and is protecting her airway. She was reportedly responding/answering to her name on arrival to ED, but was not oriented to place or time (AAOx1). Per pt's family, pt was at her baseline (normal mentation, AAOx3) as of Wednesday (02/26/2018). She had a mechanical non-syncopal fall on Monday (02/24/2018), for which she was evaluated in the ED on Wednesday (04/24), but she was found to only have musculoskeletal strain, and, being as she was otherwise normal, she was discharged home with an Rx for Baclofen. It is unclear to me if she has taken the Baclofen since it was prescribed. It is reported that she was lethargic on the Thursday (02/27/2018) morning, with transient improvement after breakfast, though lethargy again worsened throughout the day into the afternoon. She was reported to have frank AMS (confusion/disorientation) by ~2000PM. ROS as above, otherwise unobtainable, pt non-verbal. Had last HD session on Wednesday (04/24). Pt still makes urine.  PAST MEDICAL HISTORY:   Past Medical History:  Diagnosis Date  . Anxiety   . CAD (coronary  artery disease)   . Chronic anemia    Dr Inez Pilgrim  . Dysrhythmia   . GERD (gastroesophageal reflux disease)   . Glaucoma   . Gout   . Gout   . Hx of colonic polyps   . Hyperlipidemia   . Hypertension   . Lower back pain   . Miscarriage   . Myocardial infarction ([place])   . 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 [facility])    Dialysis M-W-F  . Neuropathy   . 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 Peritoneal dialysis status ([place])   . Renal failure    Dr Holley Raring  . Renal insufficiency   . 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 Vaginal delivery    x 4    PAST SURGICAL HISTORY:   Past Surgical History:  Procedure Laterality Date  . ABDOMINAL HYSTERECTOMY  1983  . AV FISTULA PLACEMENT Left 09/21/2016   Procedure: ARTERIOVENOUS (AV) FISTULA CREATION ( BRACHIOCEPHALIC );  Surgeon: Katha Cabal, MD;  Location: [facility] ORS;  Service: Vascular;  Laterality: Left;  . BACK SURGERY  6/08   Dr Collier Salina  . BACK SURGERY  1987   disc  . CHOLECYSTECTOMY    . DIALYSIS/PERMA CATHETER REMOVAL N/A 02/25/2017   Procedure: Dialysis/Perma Catheter Removal;  Surgeon: Algernon Huxley, MD;  Location: [facility] LAB;  Service: Cardiovascular;  Laterality: N/A;  . INSERTION OF DIALYSIS CATHETER  2017  . PERITONEAL CATHETER INSERTION    . REMOVAL OF A  DIALYSIS CATHETER N/A 09/21/2016   Procedure: REMOVAL OF A DIALYSIS CATHETER ( PERITONEAL DIALYSIS CATH );  Surgeon: Katha Cabal, MD;  Location: [facility] ORS;  Service: Vascular;  Laterality: N/A;  . VITRECTOMY AND CATARACT Left 11/04/2017   Procedure: VITRECTOMY AND CATARACT;  Surgeon: Leandrew Koyanagi, MD;  Location: [facility] ORS;  Service: Ophthalmology;  Laterality: Left;  US00:51.6 AP%21.8 XBJ47.82 FLUID LOT #[id number]    SOCIAL HISTORY:   Social History   Tobacco Use  . Smoking status: Former Smoker    Last attempt to quit: 11/05/1990    Years since quitting: 27.3  .  Smokeless tobacco: Never Used  Substance Use Topics  . Alcohol use: No    FAMILY HISTORY:   Family History  Problem Relation Age of Onset  . Heart attack Father   . Hypertension Mother   . Hyperlipidemia Mother   . Coronary artery disease Unknown        strong fam hx  . Kidney failure Brother   . Breast cancer Neg Hx     DRUG ALLERGIES:   Allergies  Allergen Reactions  . Alprazolam Other (See Comments)    Unable to sleep and confusion    REVIEW OF SYSTEMS:   Review of Systems  Unable to perform ROS: Mental status change  Constitutional: Positive for malaise/fatigue.  Neurological: Positive for speech change.   ROS as per HPI, otherwise unobtainable (2/2 AMS, pt AAOx0-1, non-verbal).  MEDICATIONS AT HOME:   Prior to Admission medications   Medication Sig Start Date End Date Taking? Authorizing Provider  allopurinol (ZYLOPRIM) 100 MG tablet Take 200 mg by mouth at bedtime.   Yes [provider]  amLODipine (NORVASC) 10 MG tablet Take 10 mg by mouth daily. 11/03/16  Yes [provider]  aspirin EC 81 MG tablet Take 1 tablet (81 mg total) by mouth daily. 06/18/16  Yes Fritzi Mandes, MD  baclofen (LIORESAL) 10 MG tablet Take 1 tablet (10 mg total) by mouth 3 (three) times daily. 02/26/18  Yes Triplett, Cari B, FNP  brimonidine (ALPHAGAN) 0.2 % ophthalmic solution Place 1 drop into the left eye 3 (three) times daily. 02/03/18  Yes [provider]  cloNIDine (CATAPRES) 0.1 MG tablet Take 0.1 mg by mouth See admin instructions. Take 1 tab bid on Sunday, Tuesday, Thursday, Saturday then take 1 tab qhs on Monday, Wednesday, Friday 11/08/16  Yes [provider]  clopidogrel (PLAVIX) 75 MG tablet Take 1 tablet (75 mg total) by mouth daily. 01/26/17  Yes Gouru, Illene Silver, MD  colchicine 0.6 MG tablet Take 0.5 tablets (0.3 mg total) by mouth 2 (two) times a week. 07/09/16  Yes Demetrios Loll, MD  famotidine (PEPCID) 20 MG tablet Take 20 mg by mouth at bedtime.   11/29/16  Yes [provider]  ferrous sulfate 325 (65 FE) MG tablet Take 1 tablet (325 mg total) by mouth daily with breakfast. 06/18/16  Yes Fritzi Mandes, MD  furosemide (LASIX) 40 MG tablet Take 1 tablet (40 mg total) by mouth daily. 07/05/16  Yes Demetrios Loll, MD  gabapentin (NEURONTIN) 100 MG capsule Take 1 capsule (100 mg total) by mouth at bedtime. Patient taking differently: Take 200 mg by mouth 3 (three) times daily.  07/05/16  Yes Demetrios Loll, MD  glimepiride (AMARYL) 1 MG tablet Take 1 mg by mouth daily with breakfast.   Yes [provider]  hydrALAZINE (APRESOLINE) 50 MG tablet Take 50 mg by mouth See admin instructions. take 1 tab po bid on Sunday,  Tuesday, Thursday, Saturday then take 1 tab qhs on Monday, Wednesday, Friday 11/29/16  Yes [provider]  isosorbide mononitrate (IMDUR) 30 MG 24 hr tablet Take 30 mg by mouth 2 (two) times daily. 11/03/16  Yes [provider]  metoprolol succinate (TOPROL-XL) 25 MG 24 hr tablet Take 1 tablet (25 mg total) by mouth at bedtime. 12/13/16  Yes Wieting, Richard, MD  mirtazapine (REMERON) 15 MG tablet Take 15 mg by mouth at bedtime. 02/03/18  Yes [provider]  multivitamin (RENA-VIT) TABS tablet Take 1 tablet by mouth daily.   Yes [provider]  pantoprazole (PROTONIX) 40 MG tablet Take 1 tablet (40 mg total) by mouth daily. 06/18/16  Yes Fritzi Mandes, MD  pravastatin (PRAVACHOL) 40 MG tablet Take 40 mg by mouth at bedtime.  06/21/15  Yes [provider]  acetaminophen (TYLENOL) 650 MG CR tablet Take 650 mg by mouth every 8 (eight) hours as needed for pain.    [provider]  amitriptyline (ELAVIL) 25 MG tablet Take 25 mg by mouth at bedtime as needed (foot pain).    [provider]  cinacalcet (SENSIPAR) 90 MG tablet Take 90 mg by mouth daily. With lunch    [provider]  dextromethorphan-guaiFENesin (MUCINEX DM) 30-600 MG 12hr tablet Take 1 tablet by mouth 2 (two)  times daily.    [provider]  diclofenac sodium (VOLTAREN) 1 % GEL Apply 2 g four times a day PRN Not a candidate for oral NSAIDS, ESRD on PD 06/27/16   [provider]  docusate sodium (COLACE) 100 MG capsule Take 1 capsule (100 mg total) by mouth daily as needed for mild constipation. 12/13/16   Loletha Grayer, MD  feeding supplement, ENSURE ENLIVE, (ENSURE ENLIVE) LIQD Take 237 mLs by mouth 2 (two) times daily between meals. 01/25/17   Max Sane, MD  fluticasone (FLONASE) 50 MCG/ACT nasal spray Place 2 sprays into both nostrils daily.     [provider]  insulin aspart (NOVOLOG) 100 UNIT/ML injection Inject 0-5 Units into the skin at bedtime. Patient not taking: Reported on 11/04/2017 05/28/16   Fritzi Mandes, MD  ipratropium (ATROVENT) 0.03 % nasal spray Place 1 spray into both nostrils 3 (three) times daily.     [provider]  ipratropium-albuterol (DUONEB) 0.5-2.5 (3) MG/3ML SOLN Take 3 mLs by nebulization every 6 (six) hours as needed. 12/14/16   Gladstone Lighter, MD  latanoprost (XALATAN) 0.005 % ophthalmic solution Place 1 drop into both eyes at bedtime. 03/20/16   [provider]  ondansetron (ZOFRAN ODT) 4 MG disintegrating tablet Take 1 tablet (4 mg total) by mouth every 8 (eight) hours as needed for nausea or vomiting. Patient not taking: Reported on 07/11/2017 04/04/17   Darel Hong, MD      VITAL SIGNS:  Blood pressure (!) 169/56, pulse 64, temperature 98.6 F (37 C), temperature source Oral, resp. rate 19, height 5\' 7"  (1.702 m), weight 77.1 kg (170 lb), SpO2 98 %.  PHYSICAL EXAMINATION:  Physical Exam  Constitutional: She appears well-developed and well-nourished. She appears lethargic. She is easily aroused.  Non-toxic appearance. She does not have a sickly appearance. She does not appear ill. No distress.  HENT:  Head: Normocephalic and atraumatic.  Eyes: Conjunctivae and EOM are normal. No scleral icterus.  Neck: Neck  supple. No JVD present. No thyromegaly present.  Cardiovascular: Normal rate, regular rhythm, S1 normal, S2 normal and normal heart sounds.  No extrasystoles are present. Exam reveals no gallop,  no S3, no S4, no distant heart sounds and no friction rub.  No murmur heard. Pulmonary/Chest: Effort normal and breath sounds normal. No stridor. No respiratory distress. She has no wheezes. She has no rhonchi. She has no rales.  Abdominal: Soft. Bowel sounds are normal. She exhibits no distension. There is no tenderness. There is no rebound and no guarding.  Musculoskeletal: She exhibits edema (+) trace B/L LE edema.  Lymphadenopathy:    She has no cervical adenopathy.  Neurological: She is easily aroused. She appears lethargic. She is disoriented.  Otherwise non-focal.  Skin: Skin is warm and dry. No rash noted. She is not diaphoretic. No erythema.  Psychiatric: Her mood appears not anxious. Her affect is not angry, not blunt, not labile and not inappropriate. She is withdrawn. She is not agitated, not aggressive, not hyperactive and not combative. She does not exhibit a depressed mood. She is noncommunicative. She is inattentive.   LABORATORY PANEL:   CBC Recent Labs  Lab 02/27/18 2106  WBC 7.7  HGB 12.3  HCT 37.7  PLT 188   ------------------------------------------------------------------------------------------------------------------  Chemistries  Recent Labs  Lab 02/27/18 2106  NA 139  K 5.0  CL 96*  CO2 26  GLUCOSE 110*  BUN 49*  CREATININE 6.68*  CALCIUM 8.1*  AST 45*  ALT 45  ALKPHOS 165*  BILITOT 0.7   ------------------------------------------------------------------------------------------------------------------  Cardiac Enzymes Recent Labs  Lab 02/27/18 2106  TROPONINI 0.04*   ------------------------------------------------------------------------------------------------------------------  RADIOLOGY:  Dg Ribs Unilateral W/chest Right  Result Date:  02/26/2018 CLINICAL DATA:  77 y/o F; status post fall 2 days ago. Right upper anterior rib pain. EXAM: RIGHT RIBS AND CHEST - 3+ VIEW COMPARISON:  None. FINDINGS: No fracture or other bone lesions are seen involving the ribs. There is no evidence of pneumothorax or pleural effusion. Both lungs are clear. Heart size and mediastinal contours are within normal limits. IMPRESSION: Negative. Electronically Signed   By: Kristine Garbe M.D.   On: 02/26/2018 19:13   Dg Shoulder Right  Result Date: 02/26/2018 CLINICAL DATA:  Fall 2 days ago.  Right arm pain. EXAM: RIGHT SHOULDER - 2+ VIEW COMPARISON:  None. FINDINGS: Degenerative changes in the [facility] joint with joint space narrowing and spurring. Glenohumeral joint is maintained. No acute bony abnormality. Specifically, no fracture, subluxation, or dislocation. Soft tissues are intact. IMPRESSION: Degenerative changes in the [facility] joint.  No acute bony abnormality. Electronically Signed   By: Rolm Baptise M.D.   On: 02/26/2018 19:03   Ct Head Wo Contrast  Result Date: 02/27/2018 CLINICAL DATA:  Altered mental status. EXAM: CT HEAD WITHOUT CONTRAST TECHNIQUE: Contiguous axial images were obtained from the base of the skull through the vertex without intravenous contrast. COMPARISON:  Brain MRI 05/15/2017 FINDINGS: Brain: No mass lesion, intraparenchymal hemorrhage or extra-axial collection. No evidence of acute cortical infarct. There is periventricular hypoattenuation compatible with chronic microvascular disease. Vascular: Atherosclerotic calcification of the vertebral and internal carotid arteries at the skull base. No hyperdense vessel. Skull: Normal visualized skull base, calvarium and extracranial soft tissues. Sinuses/Orbits: No sinus fluid levels or advanced mucosal thickening. No mastoid effusion. Normal orbits. IMPRESSION: Chronic small vessel disease without acute intracranial abnormality. Electronically Signed   By: Ulyses Jarred M.D.   On: 02/27/2018  21:36   Dg Chest Portable 1 View  Result Date: 02/27/2018 CLINICAL DATA:  Atrial fibrillation and weakness EXAM: PORTABLE CHEST 1 VIEW COMPARISON:  02/26/2018 FINDINGS: Stable cardiomegaly with aortic atherosclerosis. Low lung volumes with bibasilar atelectasis but  no acute pulmonary edema, pneumonic consolidation, effusion or pneumothorax. Soft tissue calcifications about the right humerus consistent with rotator cuff calcific tendinopathy. Osteoarthritis of the right AC joint. Degenerative changes are seen along the dorsal spine. IMPRESSION: Stable cardiomegaly with aortic atherosclerosis. Bibasilar atelectasis with low lung volumes. No active pulmonary disease. Electronically Signed   By: Ashley Royalty M.D.   On: 02/27/2018 21:35   Dg Hip Unilat W Or Wo Pelvis 2-3 Views Right  Result Date: 02/26/2018 CLINICAL DATA:  Right hip pain.  Fall. EXAM: DG HIP (WITH OR WITHOUT PELVIS) 2-3V RIGHT COMPARISON:  None. FINDINGS: Mild symmetric osteoarthritic changes in the hips with spurring. SI joints are symmetric and unremarkable. No acute bony abnormality. Specifically, no fracture, subluxation, or dislocation. IMPRESSION: No acute bony abnormality. Electronically Signed   By: Rolm Baptise M.D.   On: 02/26/2018 19:04   IMPRESSION AND PLAN:   A/P: 61F AMS/UTI.  1.) AMS/UTI: Pt p/w 1d Hx AMS. AAOx1 on initial arrival to ED. Non-verbal at the time of my exam, unable to evaluate orientation. Pt does not follow commands. VSS, Physical exam relatively benign, pt otherwise neurologically non-focal. U/A (+) UTI. Lactate 1.5. (-) fever, (-) leukocytosis, SIRS (-). CXR (-) acute infiltrate. CT head (-) acute intracranial abnl. EtOH (-). Other AMS workup (-) thus far. Remaining bloodwork demonstrates ESRD, discussed below. TSH, B12, folate, RPR, ammonia pending. Ceftriaxone 1g IV qD. Hold Baclofen and Neurontin.  2.) ESRD: HD M/W/F via LUE AVF. Nephrology consult for HD (Friday 02/28/2018). C/w Sensipar, Lasix.  3.)  Macrocytic anemia: Pt w/ documented Hx anemia of chronic disease. Hgb 12.3, MCV 109.8. Folate, B12 levels pending. C/w home iron supplementation.  4.) Troponin elevation: Troponin marginally elevated (0.04), likely 2/2 decreased renal clearance.  5.) Hyperglycemia/T2NIDDM: MSSI. Hold Glimepiride. Hold Neurontin (2/2 AMS).  6.) HTN: c/w Norvasc, Clonidine, Lasix, Hydralazine, Imdur, Toprol.  7.) HLD/CAD/PVD: c/w ASA, Plavix, Statin, beta blocker.  8.) GERD: c/w Protonix + Pepcid.  9.) Gout: c/w Allopurinol, Colchicine.  10.) Glaucoma: c/w Alphagan, Xalatan.  11.) Anxiety: c/w Remeron.  12.) FEN/GI: Renal diet, c/w Protonix + Pepcid.  13.) DVT PPx: Heparin 5000u SQ TID.  14.) Code status: Full code.  15.) Disposition: Admission, pt expected to stay > 2 midnights.   All the records are reviewed and case discussed with ED provider. Management plans discussed with the patient, family and they are in agreement.  CODE STATUS: Full code.  TOTAL TIME TAKING CARE OF THIS PATIENT: 90 minutes.    Arta Silence M.D on 02/28/2018 at 1:13 AM  Between 7am to 6pm - Pager - [phone]  After 6pm go to [email or web address] - Proofreader  [facility]  Office  [phone]  CC: Primary care physician; [name], MD   Note: This dictation was prepared with Dragon dictation along with smaller phrase technology. Any transcriptional errors that result from this process are unintentional.

## 2018-02-28 NOTE — Progress Notes (Signed)
Pre HD Assessment, pt did respond to my voice (grunting), and was able to squeez my hand with her right hand on command.    02/28/18 1530  Neurological  Level of Consciousness Unresponsive  Orientation Level Other (comment) (no response to voice, able to squeeze my hand )  Respiratory  Respiratory Pattern Regular  Chest Assessment Chest expansion symmetrical  Bilateral Breath Sounds Diminished  Cough Non-productive  Cardiac  Pulse Irregular  Heart Sounds S1, S2  ECG Monitor Yes  Cardiac Rhythm NSR  Ectopy Multifocal PVC's  Vascular  R Radial Pulse +2  L Radial Pulse +2  Edema Generalized  Generalized Edema +2  Psychosocial  Psychosocial (WDL) X  Patient Behaviors Not interactive

## 2018-02-28 NOTE — Progress Notes (Signed)
Post HD Assessment, no change from initial assessment, pt still able to squeeze my hand on command, no other interaction.    02/28/18 1917  Neurological  Level of Consciousness Unresponsive  Orientation Level Other (comment) (no response to voice, able to squeeze my hand )  Respiratory  Respiratory Pattern Regular  Chest Assessment Chest expansion symmetrical  Bilateral Breath Sounds Diminished  Cough Non-productive  Cardiac  Pulse Irregular  Heart Sounds S1, S2  ECG Monitor Yes  Cardiac Rhythm NSR  Ectopy Multifocal PVC's  Vascular  R Radial Pulse +2  L Radial Pulse +2  Edema Generalized  Generalized Edema +2  Psychosocial  Psychosocial (WDL) X  Patient Behaviors Not interactive

## 2018-02-28 NOTE — Progress Notes (Signed)
HD Tx started. Pt is non responsive, was able to squeeze my hand with her left hand on command.   02/28/18 1531  Vital Signs  Pulse Rate 60  Pulse Rate Source Monitor  Resp 18  BP (!) 180/63  BP Location Right Arm  BP Method Automatic  Patient Position (if appropriate) Lying  Oxygen Therapy  SpO2 100 %  O2 Device Room Air  Pain Assessment  Pain Scale PAINAD  PAINAD (Pain Assessment in Advanced Dementia)  Breathing 0  Negative Vocalization 0  Facial Expression 0  Body Language 0  Consolability 0  PAINAD Score 0  Dialysis Weight  Weight 79.3 kg (174 lb 13.2 oz)  Type of Weight Pre-Dialysis  Time-Out for Hemodialysis  What Procedure? HD  Pt Identifiers(min of two) First/Last Name;MRN/Account#  Correct Site? Yes  Correct Side? Yes  Correct Procedure? Yes  Consents Verified? Yes  Rad Studies Available? N/A  Safety Precautions Reviewed? Yes  Engineer, civil (consulting) Number [phone]  Station Number 3  UF/Alarm Test Passed  Conductivity: Meter 14  Conductivity: Machine  14  pH 7.2  Reverse Osmosis Main  Normal Saline Lot Number [id number]  Dialyzer Lot Number [id number]  Disposable Set Lot Number [id number]  Machine Temperature 98.6 F (37 C)  Musician and Audible Yes  Blood Lines Intact and Secured Yes  Pre Treatment Patient Checks  Vascular access used during treatment Fistula  Hepatitis B Surface Antigen Results Negative  Date Hepatitis B Surface Antigen Drawn 01/26/17  Hepatitis B Surface Antibody 10 (12/11/2016)  Date Hepatitis B Surface Antibody Drawn 12/11/16  Hemodialysis Consent Verified Yes  Hemodialysis Standing Orders Initiated Yes  ECG (Telemetry) Monitor On Yes  Prime Ordered Normal Saline  Length of  DialysisTreatment -hour(s) 3.5 Hour(s)  Dialysis Treatment Comments Na 140  Dialyzer Elisio 17H NR  Dialysate 3K, 2.5 Ca  Dialysis Anticoagulant None  Dialysate Flow Ordered 600  Blood Flow Rate Ordered 400 mL/min  Ultrafiltration Goal 2 Liters   Dialysis Blood Pressure Support Ordered Normal Saline  During Hemodialysis Assessment  Blood Flow Rate (mL/min) 400 mL/min  Arterial Pressure (mmHg) -190 mmHg  Venous Pressure (mmHg) 210 mmHg  Transmembrane Pressure (mmHg) 50 mmHg  Ultrafiltration Rate (mL/min) 1000 mL/min  Dialysate Flow Rate (mL/min) 600 ml/min  Conductivity: Machine  14.3  HD Safety Checks Performed Yes  Dialysis Fluid Bolus Normal Saline  Bolus Amount (mL) 250 mL  Intra-Hemodialysis Comments Tx initiated

## 2018-02-28 NOTE — Progress Notes (Signed)
HD Tx ended.    02/28/18 1845  Vital Signs  Pulse Rate 73  Resp 17  BP (!) 159/65  Oxygen Therapy  SpO2 100 %  During Hemodialysis Assessment  Blood Flow Rate (mL/min) 375 mL/min  Arterial Pressure (mmHg) -200 mmHg  Venous Pressure (mmHg) 220 mmHg  Transmembrane Pressure (mmHg) 60 mmHg  Ultrafiltration Rate (mL/min) 430 mL/min  Dialysate Flow Rate (mL/min) 600 ml/min  Conductivity: Machine  14  HD Safety Checks Performed Yes  Dialysis Fluid Bolus Normal Saline  Bolus Amount (mL) 250 mL  Intra-Hemodialysis Comments Tx completed;Tolerated well

## 2018-02-28 NOTE — Progress Notes (Addendum)
[facility] [place] NAME: Laurie Steele    MR#:  [id number]  DATE OF BIRTH:  [birth year]  SUBJECTIVE:   Patient with dilated pupils. Son is at bedside Responds only minimally to deep sternal rub  REVIEW OF SYSTEMS:    Unable to obtain   Tolerating Diet: N.p.o.      DRUG ALLERGIES:   Allergies  Allergen Reactions  . Alprazolam Other (See Comments)    Unable to sleep and confusion    VITALS:  Blood pressure (!) 147/56, pulse 62, temperature 97.7 F (36.5 C), temperature source Oral, resp. rate 18, height 5\' 7"  (1.702 m), weight 77.1 kg (170 lb), SpO2 98 %.  PHYSICAL EXAMINATION:  Constitutional: Appears critically ill  hENT: Normocephalic. Marland Kitchen Oropharynx is clear and moist.  Eyes: Conjunctivae normal pupils 5 mm bilaterally symmetrically  neck: . Neck supple. No JVD. No tracheal deviation. CVS: RRR, S1/S2 +, no murmurs, no gallops, no carotid bruit.  Pulmonary: Effort and breath sounds normal, no stridor, rhonchi, wheezes, rales.  Abdominal: Soft. BS +,  no distension, tenderness, rebound or guarding.  Musculoskeletal: Cannot assess due to patient's mental status Neuro: Completely obtunded Skin: Skin is warm and dry. No rash noted. Psychiatric: Obtunded   LABORATORY PANEL:   CBC Recent Labs  Lab 02/27/18 2106  WBC 7.7  HGB 12.3  HCT 37.7  PLT 188   ------------------------------------------------------------------------------------------------------------------  Chemistries  Recent Labs  Lab 02/27/18 2106  NA 139  K 5.0  CL 96*  CO2 26  GLUCOSE 110*  BUN 49*  CREATININE 6.68*  CALCIUM 8.1*  AST 45*  ALT 45  ALKPHOS 165*  BILITOT 0.7   ------------------------------------------------------------------------------------------------------------------  Cardiac Enzymes Recent Labs  Lab 02/27/18 2106  TROPONINI 0.04*    ------------------------------------------------------------------------------------------------------------------  RADIOLOGY:  Dg Ribs Unilateral W/chest Right  Result Date: 02/26/2018 CLINICAL DATA:  77 y/o F; status post fall 2 days ago. Right upper anterior rib pain. EXAM: RIGHT RIBS AND CHEST - 3+ VIEW COMPARISON:  None. FINDINGS: No fracture or other bone lesions are seen involving the ribs. There is no evidence of pneumothorax or pleural effusion. Both lungs are clear. Heart size and mediastinal contours are within normal limits. IMPRESSION: Negative. Electronically Signed   By: Kristine Garbe M.D.   On: 02/26/2018 19:13   Dg Shoulder Right  Result Date: 02/26/2018 CLINICAL DATA:  Fall 2 days ago.  Right arm pain. EXAM: RIGHT SHOULDER - 2+ VIEW COMPARISON:  None. FINDINGS: Degenerative changes in the [facility] joint with joint space narrowing and spurring. Glenohumeral joint is maintained. No acute bony abnormality. Specifically, no fracture, subluxation, or dislocation. Soft tissues are intact. IMPRESSION: Degenerative changes in the [facility] joint.  No acute bony abnormality. Electronically Signed   By: Rolm Baptise M.D.   On: 02/26/2018 19:03   Ct Head Wo Contrast  Result Date: 02/27/2018 CLINICAL DATA:  Altered mental status. EXAM: CT HEAD WITHOUT CONTRAST TECHNIQUE: Contiguous axial images were obtained from the base of the skull through the vertex without intravenous contrast. COMPARISON:  Brain MRI 05/15/2017 FINDINGS: Brain: No mass lesion, intraparenchymal hemorrhage or extra-axial collection. No evidence of acute cortical infarct. There is periventricular hypoattenuation compatible with chronic microvascular disease. Vascular: Atherosclerotic calcification of the vertebral and internal carotid arteries at the skull base. No hyperdense vessel. Skull: Normal visualized skull base, calvarium and extracranial soft tissues. Sinuses/Orbits: No sinus fluid levels or advanced mucosal  thickening. No mastoid effusion. Normal orbits. IMPRESSION: Chronic small vessel disease  without acute intracranial abnormality. Electronically Signed   By: Ulyses Jarred M.D.   On: 02/27/2018 21:36   Dg Chest Portable 1 View  Result Date: 02/27/2018 CLINICAL DATA:  Atrial fibrillation and weakness EXAM: PORTABLE CHEST 1 VIEW COMPARISON:  02/26/2018 FINDINGS: Stable cardiomegaly with aortic atherosclerosis. Low lung volumes with bibasilar atelectasis but no acute pulmonary edema, pneumonic consolidation, effusion or pneumothorax. Soft tissue calcifications about the right humerus consistent with rotator cuff calcific tendinopathy. Osteoarthritis of the right AC joint. Degenerative changes are seen along the dorsal spine. IMPRESSION: Stable cardiomegaly with aortic atherosclerosis. Bibasilar atelectasis with low lung volumes. No active pulmonary disease. Electronically Signed   By: Ashley Royalty M.D.   On: 02/27/2018 21:35   Dg Hip Unilat W Or Wo Pelvis 2-3 Views Right  Result Date: 02/26/2018 CLINICAL DATA:  Right hip pain.  Fall. EXAM: DG HIP (WITH OR WITHOUT PELVIS) 2-3V RIGHT COMPARISON:  None. FINDINGS: Mild symmetric osteoarthritic changes in the hips with spurring. SI joints are symmetric and unremarkable. No acute bony abnormality. Specifically, no fracture, subluxation, or dislocation. IMPRESSION: No acute bony abnormality. Electronically Signed   By: Rolm Baptise M.D.   On: 02/26/2018 19:04     ASSESSMENT AND PLAN:   77 year old female with end-stage renal disease on hemodialysis brought in to the emergency room due to altered mental status.  1.  Acute encephalopathy possibly from UTI Continue Rocephin Check MRI and ammonia level, TSh due to LOC this am   2.  End-stage renal disease on hemodialysis: Consultation nephrology requested   3.  Elevated troponin/CAD: Continue to monitor On metoprolol, isosorbide, Plavix, aspirin 4.  Essential hypertension: Patient is currently intended  unable to take blood pressure medications  5.  Diabetes: Sliding scale initiated  Patient is critically ill at this time.  Case discussed with son at bedside.  Management plans discussed with the patient's son and he is in agreement.  CODE STATUS: Full  Critical care TOTAL TIME TAKING CARE OF THIS PATIENT: 30 minutes.     POSSIBLE D/C 4 days, DEPENDING ON CLINICAL CONDITION.   Rajean Desantiago M.D on 02/28/2018 at 10:11 AM  Between 7am to 6pm - Pager - [phone] After 6pm go to [email or web address] - password [email or web address] [facility]  Office  [phone]  CC: Primary care physician; [name], MD  Note: This dictation was prepared with Dragon dictation along with smaller phrase technology. Any transcriptional errors that result from this process are unintentional.

## 2018-02-28 NOTE — Progress Notes (Signed)
PT Cancellation Note  Patient Details Name: Laurie Steele MRN: [id number] DOB: Jun 11, 1941   Cancelled Treatment:    Reason Eval/Treat Not Completed: Patient at procedure or test/unavailable Spoke with nursing earlier and pt was too lethargic, unarousable.  She is in dialysis this afternoon.  Will try back tomorrow if appropriate.  Kreg Shropshire, DPT 02/28/2018, 5:13 PM

## 2018-02-28 NOTE — Progress Notes (Signed)
Family Meeting Note  Advance Directive:no  Today a meeting took place with the patient;s son.  Patient is unable to participate due WP:YKDXIP capacity Comatose   The following clinical team members were present during this meeting:MD  The following were discussed:Patient's diagnosis: AMS/encephalopathy  , Patient's progosis: Unable to determine and Goals for treatment: Full Code  Additional follow-up to be provided: palliative care doe GOC   Time spent during discussion:18 minutes  Caleyah Jr, MD

## 2018-03-01 LAB — GLUCOSE, CAPILLARY
GLUCOSE-CAPILLARY: 104 mg/dL — AB (ref 65–99)
GLUCOSE-CAPILLARY: 101 mg/dL — AB (ref 65–99)
Glucose-Capillary: 105 mg/dL — ABNORMAL HIGH (ref 65–99)
Glucose-Capillary: 97 mg/dL (ref 65–99)

## 2018-03-01 LAB — BASIC METABOLIC PANEL
ANION GAP: 12 (ref 5–15)
BUN: 32 mg/dL — ABNORMAL HIGH (ref 6–20)
CALCIUM: 8.5 mg/dL — AB (ref 8.9–10.3)
CO2: 30 mmol/L (ref 22–32)
Chloride: 99 mmol/L — ABNORMAL LOW (ref 101–111)
Creatinine, Ser: 5.58 mg/dL — ABNORMAL HIGH (ref 0.44–1.00)
GFR, EST AFRICAN AMERICAN: 8 mL/min — AB (ref >60)
GFR, EST NON AFRICAN AMERICAN: 7 mL/min — AB (ref >60)
GLUCOSE: 106 mg/dL — AB (ref 65–99)
Potassium: 3.9 mmol/L (ref 3.5–5.1)
SODIUM: 141 mmol/L (ref 135–145)

## 2018-03-01 LAB — URINE CULTURE: CULTURE: NO GROWTH

## 2018-03-01 LAB — CBC
HCT: 36.3 % (ref 35.0–47.0)
Hemoglobin: 12.5 g/dL (ref 12.0–16.0)
MCH: 37 pg — ABNORMAL HIGH (ref 26.0–34.0)
MCHC: 34.4 g/dL (ref 32.0–36.0)
MCV: 107.6 fL — AB (ref 80.0–100.0)
PLATELETS: 200 10*3/uL (ref 150–440)
RBC: 3.38 MIL/uL — AB (ref 3.80–5.20)
RDW: 14.5 % (ref 11.5–14.5)
WBC: 7.5 10*3/uL (ref 3.6–11.0)

## 2018-03-01 LAB — SYPHILIS: RPR W/REFLEX TO RPR TITER AND TREPONEMAL ANTIBODIES, TRADITIONAL SCREENING AND DIAGNOSIS ALGORITHM: RPR Ser Ql: NONREACTIVE

## 2018-03-01 MED ORDER — METOPROLOL TARTRATE 5 MG/5ML IV SOLN
5.0000 mg | INTRAVENOUS | Status: DC | PRN
Start: 2018-03-01 — End: 2018-03-04

## 2018-03-01 MED ORDER — HEPARIN SODIUM (PORCINE) 5000 UNIT/ML IJ SOLN
5000.0000 [IU] | Freq: Three times a day (TID) | INTRAMUSCULAR | Status: DC
  Administered 2018-03-01 – 2018-03-04 (×9): 5000 [IU] via SUBCUTANEOUS
  Filled 2018-03-01 (×9): qty 1

## 2018-03-01 MED ORDER — HYDRALAZINE HCL 20 MG/ML IJ SOLN: 10.0000 mg | Freq: Four times a day (QID) | INTRAMUSCULAR | Status: DC | PRN

## 2018-03-01 NOTE — Progress Notes (Signed)
[facility] [place] NAME: Laurie Steele    MR#:  [id number]  DATE OF BIRTH:  1941-07-05  SUBJECTIVE:   Patient is slowly responding this morning.  Family is at bedside. She opens her eyes and will try to lift her head. He does not follow any commands  REVIEW OF SYSTEMS:    Unable to obtain   Tolerating Diet: N.p.o.      DRUG ALLERGIES:   Allergies  Allergen Reactions  . Alprazolam Other (See Comments)    Unable to sleep and confusion    VITALS:  Blood pressure (!) 184/64, pulse 76, temperature 98.6 F (37 C), temperature source Oral, resp. rate 16, height 5\' 7"  (1.702 m), weight 79.3 kg (174 lb 13.2 oz), SpO2 97 %.  PHYSICAL EXAMINATION:  Constitutional: Appears critically ill  hENT: Normocephalic. Marland Kitchen Oropharynx is clear and moist.  Eyes: Conjunctivae normal pupils 5 mm bilaterally symmetrically  neck: . Neck supple. No JVD. No tracheal deviation. CVS: RRR, S1/S2 +, no murmurs, no gallops, no carotid bruit.  Pulmonary: Effort and breath sounds normal, no stridor, rhonchi, wheezes, rales.  Abdominal: Soft. BS +,  no distension, tenderness, rebound or guarding.  Musculoskeletal: Cannot assess due to patient's mental status Neuro: Does not follow commands  Does respond to sternal rub skin: Skin is warm and dry. No rash noted. Psychiatric: Lethargic   LABORATORY PANEL:   CBC Recent Labs  Lab 03/01/18 0529  WBC 7.5  HGB 12.5  HCT 36.3  PLT 200   ------------------------------------------------------------------------------------------------------------------  Chemistries  Recent Labs  Lab 02/27/18 2106  03/01/18 0529  NA 139   < > 141  K 5.0   < > 3.9  CL 96*   < > 99*  CO2 26   < > 30  GLUCOSE 110*   < > 106*  BUN 49*   < > 32*  CREATININE 6.68*   < > 5.58*  CALCIUM 8.1*   < > 8.5*  AST 45*  --   --   ALT 45  --   --   ALKPHOS 165*  --   --   BILITOT 0.7  --   --    < > = values in this interval not  displayed.   ------------------------------------------------------------------------------------------------------------------  Cardiac Enzymes Recent Labs  Lab 02/28/18 0952 02/28/18 1345 02/28/18 2040  TROPONINI 0.04* 0.04* 0.04*   ------------------------------------------------------------------------------------------------------------------  RADIOLOGY:  Ct Head Wo Contrast  Result Date: 02/27/2018 CLINICAL DATA:  Altered mental status. EXAM: CT HEAD WITHOUT CONTRAST TECHNIQUE: Contiguous axial images were obtained from the base of the skull through the vertex without intravenous contrast. COMPARISON:  Brain MRI 05/15/2017 FINDINGS: Brain: No mass lesion, intraparenchymal hemorrhage or extra-axial collection. No evidence of acute cortical infarct. There is periventricular hypoattenuation compatible with chronic microvascular disease. Vascular: Atherosclerotic calcification of the vertebral and internal carotid arteries at the skull base. No hyperdense vessel. Skull: Normal visualized skull base, calvarium and extracranial soft tissues. Sinuses/Orbits: No sinus fluid levels or advanced mucosal thickening. No mastoid effusion. Normal orbits. IMPRESSION: Chronic small vessel disease without acute intracranial abnormality. Electronically Signed   By: Ulyses Jarred M.D.   On: 02/27/2018 21:36   Mr Brain Wo Contrast  Result Date: 02/28/2018 CLINICAL DATA:  Altered mental status.  Recent fall. EXAM: MRI HEAD WITHOUT CONTRAST TECHNIQUE: Multiplanar, multiecho pulse sequences of the brain and surrounding structures were obtained without intravenous contrast. COMPARISON:  CT head 02/27/2018.  MR head 01/25/2017. FINDINGS: Considerable  artifact on the axial and coronal diffusion imaging scans. Some motion degradation on axial FLAIR and gradient sequence. Overall study diagnostic. Brain: Generalized atrophy. Chronic microvascular ischemic change. No acute stroke, acute hemorrhage, mass lesion, or  extra-axial fluid. Hydrocephalus ex vacuo. 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 Vascular: Flow voids are maintained.  RIGHT vertebral dominant. Skull and upper cervical spine: Grossly normal marrow signal. Suspected cervical spondylosis. Sinuses/Orbits: No sinus opacity.  Negative orbits. Other: Trace mastoid effusions.  No nasopharyngeal process. IMPRESSION: Atrophy and small vessel disease similar to priors. No acute intracranial findings. Electronically Signed   By: Staci Righter M.D.   On: 02/28/2018 12:41   Dg Chest Portable 1 View  Result Date: 02/27/2018 CLINICAL DATA:  Atrial fibrillation and weakness EXAM: PORTABLE CHEST 1 VIEW COMPARISON:  02/26/2018 FINDINGS: Stable cardiomegaly with aortic atherosclerosis. Low lung volumes with bibasilar atelectasis but no acute pulmonary edema, pneumonic consolidation, effusion or pneumothorax. Soft tissue calcifications about the right humerus consistent with rotator cuff calcific tendinopathy. Osteoarthritis of the right AC joint. Degenerative changes are seen along the dorsal spine. IMPRESSION: Stable cardiomegaly with aortic atherosclerosis. Bibasilar atelectasis with low lung volumes. No active pulmonary disease. Electronically Signed   By: Ashley Royalty M.D.   On: 02/27/2018 21:35     ASSESSMENT AND PLAN:   77 year old female with end-stage renal disease on hemodialysis brought in to the emergency room due to altered mental status.  1.  Acute encephalopathy possibly from UTI/Bactrim Continue Rocephin will open urine culture Work-up including MRI of the brain, CT head, chest x-ray, ABG, TSH, B12, RPR and ammonia level are all normal.  2.  End-stage renal disease on hemodialysis: Dialysis as per nephrology   3.  Elevated troponin/CAD: Continue to monitor On metoprolol, isosorbide, Plavix, aspirin  4.  Essential hypertension: Patient is currently intended unable to take blood pressure medications  5.  Diabetes:  Sliding scale initiated    Management plans discussed with the patient's family and they are in agreement.  CODE STATUS: Full  Critical care TOTAL TIME TAKING CARE OF THIS PATIENT: 25 minutes.     POSSIBLE D/C 2-3 days, DEPENDING ON CLINICAL CONDITION.   Laurie Wisniewski M.D on 03/01/2018 at 11:22 AM  Between 7am to 6pm - Pager - [phone] After 6pm go to [email or web address] - password [email or web address] [facility]  Office  [phone]  CC: Primary care physician; [name], MD  Note: This dictation was prepared with Dragon dictation along with smaller phrase technology. Any transcriptional errors that result from this process are unintentional.

## 2018-03-01 NOTE — Progress Notes (Signed)
PT Cancellation Note  Patient Details Name: ELLESE JULIUS MRN: [id number] DOB: [birth year]   Cancelled Treatment:    Reason Eval/Treat Not Completed: Fatigue/lethargy limiting ability to participate.  Pt still somnolent and unresponsive.  Per RN, pt has not been able to follow any commands.  Will re-attempt at a later date if pt appropriate.   Roxanne Gates, PT, DPT 03/01/2018, 3:01 PM

## 2018-03-01 NOTE — Progress Notes (Signed)
No IV fluids needed at this time per Dr. Juleen China. Madlyn Frankel, RN

## 2018-03-01 NOTE — Progress Notes (Signed)
PT Cancellation Note  Patient Details Name: ARIYONA EID MRN: [id number] DOB: [birth year]   Cancelled Treatment:    Reason Eval/Treat Not Completed: Fatigue/lethargy limiting ability to participate. Order received chart reviewed.  Pt is somnolent and unable to be aroused by verbally or by physical touch.  Will re-attempt later if time allows.   Roxanne Gates, PT, DPT 03/01/2018, 8:51 AM

## 2018-03-01 NOTE — Progress Notes (Signed)
[facility]  ROUNDING NOTE   Subjective:   Family at bedside. Patient more responsive. Remains somnolent.   Hemodialysis treatment yesterday. Tolerated treatment well. Uf of 2 Liters.   MRI negative.   Objective:  Vital signs in last 24 hours:  Temp:  [98.1 F (36.7 C)-98.6 F (37 C)] 98.6 F (37 C) (04/27 0424) Pulse Rate:  [52-81] 76 (04/27 0424) Resp:  [9-18] 16 (04/27 0424) BP: (117-191)/(37-96) 184/64 (04/27 0424) SpO2:  [89 %-100 %] 97 % (04/27 0424) Weight:  [79.3 kg (174 lb 13.2 oz)] 79.3 kg (174 lb 13.2 oz) (04/26 1531)  Weight change: 2.189 kg (4 lb 13.2 oz) Filed Weights   02/27/18 2050 02/28/18 1531  Weight: 77.1 kg (170 lb) 79.3 kg (174 lb 13.2 oz)    Intake/Output: I/O last 3 completed shifts: In: 100 [IV Piggyback:100] Out: 2202 [Urine:200; Other:2002]   Intake/Output this shift:  No intake/output data recorded.  Physical Exam: General: Lethargic, somnolent,   Head: Normocephalic, atraumatic. Moist oral mucosal membranes  Eyes: Pupils responsive to light  Neck: Supple, trachea midline  Lungs:  Clear to auscultation  Heart: Regular rate and rhythm  Abdomen:  Soft, nontender  Extremities: no peripheral edema.  Neurologic: Responds to verbal stimuli, opens eyes spontaneously  Skin: No lesions  Access: Left AVF    Basic Metabolic Panel: Recent Labs  Lab 02/27/18 2106 02/28/18 0952 03/01/18 0529  NA 139 138 141  K 5.0 3.7 3.9  CL 96* 97* 99*  CO2 26 29 30   GLUCOSE 110* 108* 106*  BUN 49* 56* 32*  CREATININE 6.68* 7.69* 5.58*  CALCIUM 8.1* 7.5* 8.5*  PHOS  --  5.8*  --     Liver Function Tests: Recent Labs  Lab 02/27/18 2106 02/28/18 0952  AST 45*  --   ALT 45  --   ALKPHOS 165*  --   BILITOT 0.7  --   PROT 7.6  --   ALBUMIN 3.4* 3.0*   No results for input(s): LIPASE, AMYLASE in the last 168 hours. Recent Labs  Lab 02/28/18 0658 02/28/18 0953  AMMONIA 20 29    CBC: Recent Labs  Lab 02/27/18 2106  02/28/18 0952 03/01/18 0529  WBC 7.7 6.5 7.5  HGB 12.3 10.7* 12.5  HCT 37.7 32.5* 36.3  MCV 109.8* 109.1* 107.6*  PLT 188 185 200    Cardiac Enzymes: Recent Labs  Lab 02/27/18 2106 02/28/18 0952 02/28/18 1345 02/28/18 2040  TROPONINI 0.04* 0.04* 0.04* 0.04*    BNP: Invalid input(s): POCBNP  CBG: Recent Labs  Lab 02/28/18 0800 02/28/18 1347 02/28/18 2356 03/01/18 0901  GLUCAP 113* 110* 107* 105*    Microbiology: Results for orders placed or performed during the hospital encounter of 01/28/18  Urine culture     Status: None   Collection Time: 01/29/18 12:19 AM  Result Value Ref Range Status   Specimen Description   Final    URINE, RANDOM Performed at [facility], [address] [place], [place] [address]    Special Requests   Final    NONE Performed at [facility], [address], [place], [place] [address]    Culture   Final    NO GROWTH Performed at [facility], [place] [address] [place], [place] [address]    Report Status 01/30/2018 FINAL  Final    Coagulation Studies: No results for input(s): LABPROT, INR in the last 72 hours.  Urinalysis: Recent Labs    02/27/18 2106  COLORURINE  YELLOW*  LABSPEC 1.010  PHURINE 8.0  GLUCOSEU 50*  HGBUR SMALL*  BILIRUBINUR NEGATIVE  KETONESUR NEGATIVE  PROTEINUR 100*  NITRITE NEGATIVE  LEUKOCYTESUR LARGE*      Imaging: Ct Head Wo Contrast  Result Date: 02/27/2018 CLINICAL DATA:  Altered mental status. EXAM: CT HEAD WITHOUT CONTRAST TECHNIQUE: Contiguous axial images were obtained from the base of the skull through the vertex without intravenous contrast. COMPARISON:  Brain MRI 05/15/2017 FINDINGS: Brain: No mass lesion, intraparenchymal hemorrhage or extra-axial collection. No evidence of acute cortical infarct. There is periventricular hypoattenuation compatible with chronic microvascular disease. Vascular: Atherosclerotic calcification of the vertebral and internal carotid  arteries at the skull base. No hyperdense vessel. Skull: Normal visualized skull base, calvarium and extracranial soft tissues. Sinuses/Orbits: No sinus fluid levels or advanced mucosal thickening. No mastoid effusion. Normal orbits. IMPRESSION: Chronic small vessel disease without acute intracranial abnormality. Electronically Signed   By: Ulyses Jarred M.D.   On: 02/27/2018 21:36   Mr Brain Wo Contrast  Result Date: 02/28/2018 CLINICAL DATA:  Altered mental status.  Recent fall. EXAM: MRI HEAD WITHOUT CONTRAST TECHNIQUE: Multiplanar, multiecho pulse sequences of the brain and surrounding structures were obtained without intravenous contrast. COMPARISON:  CT head 02/27/2018.  MR head 01/25/2017. FINDINGS: Considerable artifact on the axial and coronal diffusion imaging scans. Some motion degradation on axial FLAIR and gradient sequence. Overall study diagnostic. Brain: Generalized atrophy. Chronic microvascular ischemic change. No acute stroke, acute hemorrhage, mass lesion, or extra-axial fluid. Hydrocephalus ex vacuo. 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 Vascular: Flow voids are maintained.  RIGHT vertebral dominant. Skull and upper cervical spine: Grossly normal marrow signal. Suspected cervical spondylosis. Sinuses/Orbits: No sinus opacity.  Negative orbits. Other: Trace mastoid effusions.  No nasopharyngeal process. IMPRESSION: Atrophy and small vessel disease similar to priors. No acute intracranial findings. Electronically Signed   By: Staci Righter M.D.   On: 02/28/2018 12:41   Dg Chest Portable 1 View  Result Date: 02/27/2018 CLINICAL DATA:  Atrial fibrillation and weakness EXAM: PORTABLE CHEST 1 VIEW COMPARISON:  02/26/2018 FINDINGS: Stable cardiomegaly with aortic atherosclerosis. Low lung volumes with bibasilar atelectasis but no acute pulmonary edema, pneumonic consolidation, effusion or pneumothorax. Soft tissue calcifications about the right humerus  consistent with rotator cuff calcific tendinopathy. Osteoarthritis of the right AC joint. Degenerative changes are seen along the dorsal spine. IMPRESSION: Stable cardiomegaly with aortic atherosclerosis. Bibasilar atelectasis with low lung volumes. No active pulmonary disease. Electronically Signed   By: Ashley Royalty M.D.   On: 02/27/2018 21:35     Medications:   . cefTRIAXone (ROCEPHIN)  IV Stopped (02/28/18 2355)   . allopurinol  200 mg Oral QHS  . amLODipine  10 mg Oral Daily  . aspirin EC  81 mg Oral Daily  . brimonidine  1 drop Left Eye TID  . chlorhexidine  15 mL Mouth Rinse BID  . cinacalcet  90 mg Oral Q lunch  . cloNIDine  0.1 mg Transdermal Weekly  . cloNIDine  0.1 mg Oral 2 times per day on Sun Tue Thu Sat   And  . cloNIDine  0.1 mg Oral Q M,W,F-2000  . clopidogrel  75 mg Oral Daily  . colchicine  0.3 mg Oral Q Wed,Sat  . famotidine  20 mg Oral QHS  . feeding supplement (ENSURE ENLIVE)  237 mL Oral BID BM  . ferrous sulfate  325 mg Oral Q breakfast  . furosemide  40 mg Oral Daily  .  heparin  5,000 Units Subcutaneous Q8H  . hydrALAZINE  10 mg Intravenous Q6H  . hydrALAZINE  50 mg Oral 2 times per day on Sun Tue Thu Sat   And  . hydrALAZINE  50 mg Oral Q M,W,F-2000  . insulin aspart  0-15 Units Subcutaneous TID WC  . isosorbide mononitrate  30 mg Oral BID  . latanoprost  1 drop Both Eyes QHS  . mouth rinse  15 mL Mouth Rinse q12n4p  . metoprolol succinate  25 mg Oral QHS  . mirtazapine  15 mg Oral QHS  . multivitamin  1 tablet Oral Daily  . pantoprazole  40 mg Oral Daily  . pravastatin  40 mg Oral QHS   acetaminophen **OR** acetaminophen, amitriptyline, bisacodyl, hydrALAZINE, ipratropium-albuterol, metoprolol tartrate, ondansetron **OR** ondansetron (ZOFRAN) IV, senna-docusate  Assessment/ Plan:  Ms. ISMELDA WEATHERMAN is a 77 y.o. black female with end stage renal disease on hemodialysis, coronary artery disease, hypertension, diabetes mellitus type II, GERD,  glaucoma, gout, who presents to [facility] on 02/27/2018 for altered mental status and urinary tract infection.   [facility] MWF [address] 78.5kg L AVF  1. Altered mental status: most likely secondary to baclofen.  Mental status is slowly improving. MRI negative.   2. End Stage Renal Disease: MWF. Hemodialysis treatment yesterday. Well tolerated. UF of 2 Liters.  - Next treatment for Monday  3. Hypertension: blood pressure rising, 184/64 - IV hydralazine PRN  4. Secondary Hyperparathyroidism with hypocalcemia and hyperphosphatemia: outpatient PTH 862, calcium 7.5 and phos 6.3 - resume binders when able to take PO.   5. Anemia of chronic kidney disease: hemoglobin 12.5 - EPO as outpatient.    LOS: 1 Tayari Yankee 4/27/201911:27 AM

## 2018-03-02 LAB — GLUCOSE, CAPILLARY
GLUCOSE-CAPILLARY: 101 mg/dL — AB (ref 65–99)
GLUCOSE-CAPILLARY: 107 mg/dL — AB (ref 65–99)
Glucose-Capillary: 104 mg/dL — ABNORMAL HIGH (ref 65–99)
Glucose-Capillary: 105 mg/dL — ABNORMAL HIGH (ref 65–99)

## 2018-03-02 MED ORDER — FAMOTIDINE IN NACL 20-0.9 MG/50ML-% IV SOLN
20.0000 mg | INTRAVENOUS | Status: DC
  Administered 2018-03-02 – 2018-03-03 (×2): 20 mg via INTRAVENOUS
  Filled 2018-03-02 (×2): qty 50

## 2018-03-02 NOTE — Evaluation (Signed)
Physical Therapy Evaluation Patient Details Name: Laurie Steele MRN: [id number] DOB: 16-Feb-1941 Today's Date: 03/02/2018   History of Present Illness  Pt is a 77 year old female admitted for AMS and becoming unresponsive s/p fall at home.  PMH includes MI, renal unsufficiency, stroke, NIDDM, gout and anxiety.  Clinical Impression  Patient is a 77 year old female who was admitted following a fall in her home.  Pt is still very lethargic and intermittently responsive but a family member has stated that she was mobile with some assistance prior to hospital admission.  Pt in bed upon PT arrival and reports no pain.  She is able to perform ankle pumps when asked and is able to move LE's laterally minimally to assist PT with bed mobility.  Pt presents with generalized weakness of UE and LE and kyphotic posture.  She is max A for bed mobility and requires intermittent assistance to sit at EOB with feet on floor.  Pt tends to lean in a posterolateral direction to L side and is able to attempt to self correct when VC's are provided.  Pt presented with increasing fatigue following 2-3 min of sitting at EOB. PT assisted pt back into bed and she required total A to scoot up in bed.  Pt will continue to benefit from skilled PT with focus on strength, functional mobility, balance and use of AD.    Follow Up Recommendations SNF    Equipment Recommendations  (TBD at next venue of care.)    Recommendations for Other Services       Precautions / Restrictions Precautions Precautions: Fall Precaution Comments: Recent fall-HFR Restrictions Weight Bearing Restrictions: No      Mobility  Bed Mobility Overal bed mobility: Needs Assistance Bed Mobility: Supine to Sit;Sit to Supine     Supine to sit: Max assist Sit to supine: Max assist   General bed mobility comments: Pt able to move LE's minimally to move to EOB and push up with UE's to sit at EOB.  PT assisted pt back into bed and scooted pt up in  bed .  Pt attempted to assist wtih LE's but helped minimally.  Transfers Overall transfer level: (Did not perform.)                  Ambulation/Gait                Stairs            Wheelchair Mobility    Modified Rankin (Stroke Patients Only)       Balance Overall balance assessment: Needs assistance Sitting-balance support: Bilateral upper extremity supported;Feet supported Sitting balance-Leahy Scale: Poor Sitting balance - Comments: Pt able to sit without assistance from PT for 30 sec and then became fatigued and presented with posterolateral lean to L side.  Pt assisted pt in correcting and she was able to attempt self correction each time this happened.  Required more assistance as sitting time increased. Postural control: Posterior lean                                   Pertinent Vitals/Pain Pain Assessment: No/denies pain    Home Living Family/patient expects to be discharged to:: Skilled nursing facility                      Prior Function Level of Independence: Needs assistance   Gait / Transfers  Assistance Needed: According to family member, pt has required assistance for mobility due to a previous stroke.  Pt was walking down her hallway at home when fall occured.           Hand Dominance        Extremity/Trunk Assessment   Upper Extremity Assessment Upper Extremity Assessment: Generalized weakness    Lower Extremity Assessment Lower Extremity Assessment: Generalized weakness    Cervical / Trunk Assessment Cervical / Trunk Assessment: Kyphotic  Communication   Communication: Expressive difficulties(Pt still very lethargic but able to speak very softly and slowly.)  Cognition Arousal/Alertness: Lethargic Behavior During Therapy: Flat affect Overall Cognitive Status: No family/caregiver present to determine baseline cognitive functioning                                        General  Comments      Exercises     Assessment/Plan    PT Assessment Patient needs continued PT services  PT Problem List Decreased strength;Decreased mobility;Decreased balance;Decreased knowledge of use of DME;Decreased activity tolerance;Decreased cognition       PT Treatment Interventions DME instruction;Therapeutic activities;Gait training;Therapeutic exercise;Stair training;Balance training;Functional mobility training;Neuromuscular re-education;Patient/family education    PT Goals (Current goals can be found in the Care Plan section)  Acute Rehab PT Goals PT Goal Formulation: Patient unable to participate in goal setting    Frequency Min 2X/week   Barriers to discharge        Co-evaluation               AM-PAC PT "6 Clicks" Daily Activity  Outcome Measure Difficulty turning over in bed (including adjusting bedclothes, sheets and blankets)?: A Lot Difficulty moving from lying on back to sitting on the side of the bed? : A Lot Difficulty sitting down on and standing up from a chair with arms (e.g., wheelchair, bedside commode, etc,.)?: Unable Help needed moving to and from a bed to chair (including a wheelchair)?: Total Help needed walking in hospital room?: Total Help needed climbing 3-5 steps with a railing? : Total 6 Click Score: 8    End of Session Equipment Utilized During Treatment: Gait belt Activity Tolerance: Patient limited by fatigue;Patient limited by lethargy Patient left: in bed;with call bell/phone within reach;with bed alarm set Nurse Communication: Mobility status PT Visit Diagnosis: History of falling (Z91.81);Muscle weakness (generalized) (M62.81)    Time: 1610-9604 PT Time Calculation (min) (ACUTE ONLY): 25 min   Charges:   PT Evaluation $PT Eval Moderate Complexity: 1 Mod     PT G Codes:   PT G-Codes **NOT FOR INPATIENT CLASS** Functional Assessment Tool Used: AM-PAC 6 Clicks Basic Mobility   Roxanne Gates, PT, DPT   Roxanne Gates 03/02/2018, 5:08 PM

## 2018-03-02 NOTE — Progress Notes (Signed)
[facility]  ROUNDING NOTE   Subjective:   More awake. Following commands.    Objective:  Vital signs in last 24 hours:  Temp:  [97.9 F (36.6 C)-98.6 F (37 C)] 98.6 F (37 C) (04/28 1133) Pulse Rate:  [79-86] 82 (04/28 1133) Resp:  [16] 16 (04/28 1133) BP: (148-180)/(60-75) 167/60 (04/28 1133) SpO2:  [96 %-99 %] 97 % (04/28 1133)  Weight change:  Filed Weights   02/27/18 2050 02/28/18 1531  Weight: 77.1 kg (170 lb) 79.3 kg (174 lb 13.2 oz)    Intake/Output: I/O last 3 completed shifts: In: 100 [IV Piggyback:100] Out: -    Intake/Output this shift:  No intake/output data recorded.  Physical Exam: General: Lethargic   Head: Normocephalic, atraumatic. Moist oral mucosal membranes  Eyes: Pupils responsive to light  Neck: Supple, trachea midline  Lungs:  Clear to auscultation  Heart: Regular rate and rhythm  Abdomen:  Soft, nontender  Extremities: no peripheral edema.  Neurologic: Responds to verbal stimuli, opens eyes spontaneously  Skin: No lesions  Access: Left AVF    Basic Metabolic Panel: Recent Labs  Lab 02/27/18 2106 02/28/18 0952 03/01/18 0529  NA 139 138 141  K 5.0 3.7 3.9  CL 96* 97* 99*  CO2 26 29 30   GLUCOSE 110* 108* 106*  BUN 49* 56* 32*  CREATININE 6.68* 7.69* 5.58*  CALCIUM 8.1* 7.5* 8.5*  PHOS  --  5.8*  --     Liver Function Tests: Recent Labs  Lab 02/27/18 2106 02/28/18 0952  AST 45*  --   ALT 45  --   ALKPHOS 165*  --   BILITOT 0.7  --   PROT 7.6  --   ALBUMIN 3.4* 3.0*   No results for input(s): LIPASE, AMYLASE in the last 168 hours. Recent Labs  Lab 02/28/18 0658 02/28/18 0953  AMMONIA 20 29    CBC: Recent Labs  Lab 02/27/18 2106 02/28/18 0952 03/01/18 0529  WBC 7.7 6.5 7.5  HGB 12.3 10.7* 12.5  HCT 37.7 32.5* 36.3  MCV 109.8* 109.1* 107.6*  PLT 188 185 200    Cardiac Enzymes: Recent Labs  Lab 02/27/18 2106 02/28/18 0952 02/28/18 1345 02/28/18 2040  TROPONINI 0.04* 0.04* 0.04* 0.04*     BNP: Invalid input(s): POCBNP  CBG: Recent Labs  Lab 03/01/18 1137 03/01/18 1618 03/01/18 2159 03/02/18 0748 03/02/18 1133  GLUCAP 97 104* 101* 104* 105*    Microbiology: Results for orders placed or performed during the hospital encounter of 02/27/18  Urine Culture     Status: None   Collection Time: 02/28/18 12:59 PM  Result Value Ref Range Status   Specimen Description   Final    URINE, RANDOM Performed at [facility], [address] [place], [place] [address]    Special Requests   Final    NONE Performed at [facility], [address], [place], [place] [address]    Culture   Final    NO GROWTH Performed at [facility], [place] [address] [place], [place] [address]    Report Status 03/01/2018 FINAL  Final    Coagulation Studies: No results for input(s): LABPROT, INR in the last 72 hours.  Urinalysis: Recent Labs    02/27/18 2106  COLORURINE YELLOW*  LABSPEC 1.010  PHURINE 8.0  GLUCOSEU 50*  HGBUR SMALL*  BILIRUBINUR NEGATIVE  KETONESUR NEGATIVE  PROTEINUR 100*  NITRITE NEGATIVE  LEUKOCYTESUR LARGE*      Imaging: Mr Brain Wo Contrast  Result Date: 02/28/2018 CLINICAL DATA:  Altered mental status.  Recent fall. EXAM: MRI HEAD WITHOUT CONTRAST TECHNIQUE: Multiplanar, multiecho pulse sequences of the brain and surrounding structures were obtained without intravenous contrast. COMPARISON:  CT head 02/27/2018.  MR head 01/25/2017. FINDINGS: Considerable artifact on the axial and coronal diffusion imaging scans. Some motion degradation on axial FLAIR and gradient sequence. Overall study diagnostic. Brain: Generalized atrophy. Chronic microvascular ischemic change. No acute stroke, acute hemorrhage, mass lesion, or extra-axial fluid. Hydrocephalus ex vacuo. 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 Vascular: Flow voids are maintained.  RIGHT vertebral dominant. Skull and upper cervical spine: Grossly  normal marrow signal. Suspected cervical spondylosis. Sinuses/Orbits: No sinus opacity.  Negative orbits. Other: Trace mastoid effusions.  No nasopharyngeal process. IMPRESSION: Atrophy and small vessel disease similar to priors. No acute intracranial findings. Electronically Signed   By: Staci Righter M.D.   On: 02/28/2018 12:41     Medications:   . cefTRIAXone (ROCEPHIN)  IV Stopped (03/01/18 2330)  . famotidine (PEPCID) IV     . allopurinol  200 mg Oral QHS  . amLODipine  10 mg Oral Daily  . aspirin EC  81 mg Oral Daily  . brimonidine  1 drop Left Eye TID  . chlorhexidine  15 mL Mouth Rinse BID  . cinacalcet  90 mg Oral Q lunch  . cloNIDine  0.1 mg Transdermal Weekly  . cloNIDine  0.1 mg Oral 2 times per day on Sun Tue Thu Sat   And  . cloNIDine  0.1 mg Oral Q M,W,F-2000  . clopidogrel  75 mg Oral Daily  . colchicine  0.3 mg Oral Q Wed,Sat  . feeding supplement (ENSURE ENLIVE)  237 mL Oral BID BM  . ferrous sulfate  325 mg Oral Q breakfast  . furosemide  40 mg Oral Daily  . heparin  5,000 Units Subcutaneous Q8H  . hydrALAZINE  10 mg Intravenous Q6H  . hydrALAZINE  50 mg Oral 2 times per day on Sun Tue Thu Sat   And  . hydrALAZINE  50 mg Oral Q M,W,F-2000  . insulin aspart  0-15 Units Subcutaneous TID WC  . isosorbide mononitrate  30 mg Oral BID  . latanoprost  1 drop Both Eyes QHS  . mouth rinse  15 mL Mouth Rinse q12n4p  . metoprolol succinate  25 mg Oral QHS  . mirtazapine  15 mg Oral QHS  . multivitamin  1 tablet Oral Daily  . pantoprazole  40 mg Oral Daily  . pravastatin  40 mg Oral QHS   acetaminophen **OR** acetaminophen, amitriptyline, bisacodyl, hydrALAZINE, ipratropium-albuterol, metoprolol tartrate, ondansetron **OR** ondansetron (ZOFRAN) IV, senna-docusate  Assessment/ Plan:  [name] is a 77 y.o. black female with end stage renal disease on hemodialysis, coronary artery disease, hypertension, diabetes mellitus type II, GERD, glaucoma, gout, who  presents to [facility] on 02/27/2018 for altered mental status and urinary tract infection.   [facility] MWF [address] 78.5kg L AVF  1. Altered mental status: most likely secondary to baclofen.  Mental status is slowly improving. MRI negative.   2. End Stage Renal Disease: MWF.   - Next treatment for Monday  3. Hypertension: blood pressure elevated   - IV hydralazine PRN  4. Secondary Hyperparathyroidism with hypocalcemia and hyperphosphatemia: outpatient PTH 862, calcium 7.5 and phos 6.3 - resume binders when able to take PO.   5. Anemia of chronic kidney disease:   - EPO as outpatient.  LOS: 2 Laurie Steele 4/28/201911:59 AM

## 2018-03-02 NOTE — Progress Notes (Signed)
Initial Nutrition Assessment  DOCUMENTATION CODES:   Not applicable  INTERVENTION:  Will discontinue order for Ensure Enlive as patient is NPO pending SLP evaluation.  Pending diet advancement will order appropriate oral nutrition supplements.  NUTRITION DIAGNOSIS:   Increased nutrient needs related to catabolic illness(ESRD on HD) as evidenced by estimated needs.  GOAL:   Patient will meet greater than or equal to 90% of their needs  MONITOR:   Diet advancement, Labs, Weight trends, I & O's  REASON FOR ASSESSMENT:   Malnutrition Screening Tool    ASSESSMENT:   77 year old female with PMHx of anxiety, HLD, HTN, DM with retinopathy and nephropathy, chronic anemia, glaucoma, GERD, gout, CAD, hx MI, ESRD on HD now admitted with acute encephalopathy from UTI/Baclofen.   -Pending SLP evaluation.  Met with patient at bedside. She was awake but unable to speak or provide any history. No family members at bedside. Per review of chart it appears she was originally on PD. She was then started on HD sometime in 2017. She was 185.2 lbs on 01/28/2018 and has lost 10.4 lbs (5.6% body weight) over the past month, though unsure how much of that was fluid.  Medications reviewed and include: allopurinol, Sensipar 90 mg daily with lunch, ferrous sulfate 325 mg daily, Lasix 40 mg daily, Novolog 0-15 units TID, Remeron, Rena-vite daily, pantoprazole, ceftriaxone, famotidine.  Labs reviewed: CBG 97-105. On 4/27 Chloride 99, Potassium WNL at 3.9. Most recent phosphorus 5.8 on 02/28/2018.  Patient does not meet criteria for malnutrition at this time.  Discussed with RN.  NUTRITION - FOCUSED PHYSICAL EXAM:    Most Recent Value  Orbital Region  No depletion  Upper Arm Region  No depletion  Thoracic and Lumbar Region  No depletion  Buccal Region  No depletion  Temple Region  No depletion  Clavicle Bone Region  No depletion  Clavicle and Acromion Bone Region  No depletion  Scapular Bone  Region  Unable to assess  Dorsal Hand  No depletion  Patellar Region  Mild depletion  Anterior Thigh Region  Mild depletion  Posterior Calf Region  Moderate depletion  Edema (RD Assessment)  Mild  Hair  Reviewed  Eyes  Unable to assess  Mouth  Unable to assess  Skin  Reviewed  Nails  Reviewed     Diet Order:  Diet NPO time specified  EDUCATION NEEDS:   Not appropriate for education at this time  Skin:  Skin Assessment: Reviewed RN Assessment  Last BM:  Unknown  Height:   Ht Readings from Last 1 Encounters:  02/27/18 5' 7"  (1.702 m)    Weight:   Wt Readings from Last 1 Encounters:  02/28/18 174 lb 13.2 oz (79.3 kg)    Ideal Body Weight:  61.4 kg  BMI:  Body mass index is 27.38 kg/m.  Estimated Nutritional Needs:   Kcal:  1580-1840 (MSJ x 1.2-1.4)  Protein:  85-100 grams (1.1-1.3 grams/kg)  Fluid:  UOP + 1 L  Willey Blade, MS, RD, LDN Office: [phone] Pager: [phone] After Hours/Weekend Pager: [phone]

## 2018-03-02 NOTE — Progress Notes (Signed)
[place] at [place] NAME: Laurie Steele    MR#:  [id number]  DATE OF BIRTH:  10/16/1941  SUBJECTIVE:   Patient alert this morning and following simple commands however is not speaking  REVIEW OF SYSTEMS:    Unable to obtain   Tolerating Diet: N.p.o.      DRUG ALLERGIES:   Allergies  Allergen Reactions  . Alprazolam Other (See Comments)    Unable to sleep and confusion    VITALS:  Blood pressure (!) 167/60, pulse 82, temperature 98.6 F (37 C), resp. rate 16, height 5\' 7"  (1.702 m), weight 79.3 kg (174 lb 13.2 oz), SpO2 97 %.  PHYSICAL EXAMINATION:  Constitutional: Appears critically ill  hENT: Normocephalic. Marland Kitchen Oropharynx is clear and moist.  Eyes: Conjunctivae pupils are round and reactive  neck: . Neck supple. No JVD. No tracheal deviation. CVS: RRR, S1/S2 +, no murmurs, no gallops, no carotid bruit.  Pulmonary: Effort and breath sounds normal, no stridor, rhonchi, wheezes, rales.  Abdominal: Soft. BS +,  no distension, tenderness, rebound or guarding.  Musculoskeletal: Globally weak  neuro: Follows simple commands and opens her eyes but does not speak skin: Skin is warm and dry. No rash noted. Psychiatric: Alert this morning  LABORATORY PANEL:   CBC Recent Labs  Lab 03/01/18 0529  WBC 7.5  HGB 12.5  HCT 36.3  PLT 200   ------------------------------------------------------------------------------------------------------------------  Chemistries  Recent Labs  Lab 02/27/18 2106  03/01/18 0529  NA 139   < > 141  K 5.0   < > 3.9  CL 96*   < > 99*  CO2 26   < > 30  GLUCOSE 110*   < > 106*  BUN 49*   < > 32*  CREATININE 6.68*   < > 5.58*  CALCIUM 8.1*   < > 8.5*  AST 45*  --   --   ALT 45  --   --   ALKPHOS 165*  --   --   BILITOT 0.7  --   --    < > = values in this interval not displayed.    ------------------------------------------------------------------------------------------------------------------  Cardiac Enzymes Recent Labs  Lab 02/28/18 0952 02/28/18 1345 02/28/18 2040  TROPONINI 0.04* 0.04* 0.04*   ------------------------------------------------------------------------------------------------------------------  RADIOLOGY:  Mr Brain Wo Contrast  Result Date: 02/28/2018 CLINICAL DATA:  Altered mental status.  Recent fall. EXAM: MRI HEAD WITHOUT CONTRAST TECHNIQUE: Multiplanar, multiecho pulse sequences of the brain and surrounding structures were obtained without intravenous contrast. COMPARISON:  CT head 02/27/2018.  MR head 01/25/2017. FINDINGS: Considerable artifact on the axial and coronal diffusion imaging scans. Some motion degradation on axial FLAIR and gradient sequence. Overall study diagnostic. Brain: Generalized atrophy. Chronic microvascular ischemic change. No acute stroke, acute hemorrhage, mass lesion, or extra-axial fluid. Hydrocephalus ex vacuo. 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 Vascular: Flow voids are maintained.  RIGHT vertebral dominant. Skull and upper cervical spine: Grossly normal marrow signal. Suspected cervical spondylosis. Sinuses/Orbits: No sinus opacity.  Negative orbits. Other: Trace mastoid effusions.  No nasopharyngeal process. IMPRESSION: Atrophy and small vessel disease similar to priors. No acute intracranial findings. Electronically Signed   By: Staci Righter M.D.   On: 02/28/2018 12:41     ASSESSMENT AND PLAN:   77 year old female with end-stage renal disease on hemodialysis brought in to the emergency room due to altered mental status.  1.  Acute encephalopathy possibly from UTI/Baclofen Work-up including  MRI of the brain, CT head, chest x-ray, ABG, TSH, B12, RPR and ammonia level are all normal. Urine culture is negative however patient is still not at her baseline.  I will continue  Rocephin for UTI Discontinue baclofen Speech consultation and physical therapy consultation  2.  End-stage renal disease on hemodialysis: Dialysis as per nephrology   3.  Elevated troponin/CAD: Continue to monitor On metoprolol, isosorbide, Plavix, aspirin  4.  Essential hypertension: Continue outpatient regimen 5.  Diabetes: Sliding scale initiated    Management plans discussed with the patient's family and they are in agreement.  CODE STATUS: Full  Critical care TOTAL TIME TAKING CARE OF THIS PATIENT: 22 minutes.     POSSIBLE D/C 2-3 days, DEPENDING ON CLINICAL CONDITION.   Fatmata Legere M.D on 03/02/2018 at 11:38 AM  Between 7am to 6pm - Pager - [phone] After 6pm go to [email or web address] - password [email or web address] [facility]  Office  [phone]  CC: Primary care physician; [name], MD  Note: This dictation was prepared with Dragon dictation along with smaller phrase technology. Any transcriptional errors that result from this process are unintentional.

## 2018-03-03 LAB — GLUCOSE, CAPILLARY
GLUCOSE-CAPILLARY: 104 mg/dL — AB (ref 65–99)
Glucose-Capillary: 102 mg/dL — ABNORMAL HIGH (ref 65–99)
Glucose-Capillary: 101 mg/dL — ABNORMAL HIGH (ref 65–99)
Glucose-Capillary: 173 mg/dL — ABNORMAL HIGH (ref 65–99)
Glucose-Capillary: 163 mg/dL — ABNORMAL HIGH (ref 65–99)

## 2018-03-03 MED ORDER — CEPHALEXIN 500 MG PO CAPS
500.0000 mg | ORAL_CAPSULE | Freq: Two times a day (BID) | ORAL | Status: DC
  Administered 2018-03-03 – 2018-03-04 (×2): 500 mg via ORAL
  Filled 2018-03-03 (×2): qty 1

## 2018-03-03 MED ORDER — NEPRO/CARBSTEADY PO LIQD
237.0000 mL | Freq: Three times a day (TID) | ORAL | Status: DC
  Administered 2018-03-04: 14:00:00 237 mL via ORAL

## 2018-03-03 NOTE — Progress Notes (Signed)
[place] at [place] NAME: Laurie Steele    MR#:  [id number]  DATE OF BIRTH:  [birth year]  SUBJECTIVE:   Alert this morning.  And she is speaking.  She worked with physical therapy yesterday.  Still has some confusion however much improved since admission  REVIEW OF SYSTEMS:    Unable to obtain patient still with some confusion   Tolerating Diet: N.p.o.      DRUG ALLERGIES:   Allergies  Allergen Reactions  . Alprazolam Other (See Comments)    Unable to sleep and confusion    VITALS:  Blood pressure (!) 162/74, pulse 79, temperature 98 F (36.7 C), temperature source Oral, resp. rate 16, height 5\' 7"  (1.702 m), weight 79.3 kg (174 lb 13.2 oz), SpO2 100 %.  PHYSICAL EXAMINATION:  Constitutional: Appears critically ill  hENT: Normocephalic. Marland Kitchen Oropharynx is clear and moist.  Eyes: Conjunctivae pupils are round and reactive  neck: . Neck supple. No JVD. No tracheal deviation. CVS: RRR, S1/S2 +, no murmurs, no gallops, no carotid bruit.  Pulmonary: Effort and breath sounds normal, no stridor, rhonchi, wheezes, rales.  Abdominal: Soft. BS +,  no distension, tenderness, rebound or guarding.  Musculoskeletal: Globally weak  neuro: Follows simple commands and opens her eyes he is speaking today skin: Skin is warm and dry. No rash noted. Psychiatric: Alert this morning  LABORATORY PANEL:   CBC Recent Labs  Lab 03/01/18 0529  WBC 7.5  HGB 12.5  HCT 36.3  PLT 200   ------------------------------------------------------------------------------------------------------------------  Chemistries  Recent Labs  Lab 02/27/18 2106  03/01/18 0529  NA 139   < > 141  K 5.0   < > 3.9  CL 96*   < > 99*  CO2 26   < > 30  GLUCOSE 110*   < > 106*  BUN 49*   < > 32*  CREATININE 6.68*   < > 5.58*  CALCIUM 8.1*   < > 8.5*  AST 45*  --   --   ALT 45  --   --   ALKPHOS 165*  --   --   BILITOT 0.7  --   --    < > = values in this  interval not displayed.   ------------------------------------------------------------------------------------------------------------------  Cardiac Enzymes Recent Labs  Lab 02/28/18 0952 02/28/18 1345 02/28/18 2040  TROPONINI 0.04* 0.04* 0.04*   ------------------------------------------------------------------------------------------------------------------  RADIOLOGY:  No results found.   ASSESSMENT AND PLAN:   77 year old female with end-stage renal disease on hemodialysis brought in to the emergency room due to altered mental status.  1.  Acute encephalopathy possibly from UTI/Baclofen Work-up including MRI of the brain, CT head, chest x-ray, ABG, TSH, B12, RPR and ammonia level are all normal. Urine culture is negative however patient is still not at her baseline.  I will continue Rocephin for UTI She should not use baclofen Speech consultation pending   2.  End-stage renal disease on hemodialysis: Dialysis as per nephrology   3.  Elevated troponin/CAD: Continue to monitor On metoprolol, isosorbide, Plavix, aspirin Elevated troponin due to demand ischemia.  She ruled out for ACS.   4.  Essential hypertension: Continue outpatient regimen 5.  Diabetes: Sliding scale initiated    Management plans discussed with the patient's family and they are in agreement.  CODE STATUS: Full  Critical care TOTAL TIME TAKING CARE OF THIS PATIENT: 22 minutes.   Physical therapy has recommended skilled nursing facility upon discharge Social work consult  placed  POSSIBLE D/C TOMORROW  DEPENDING ON CLINICAL CONDITION.   Abagayle Klutts M.D on 03/03/2018 at 9:01 AM  Between 7am to 6pm - Pager - [phone] After 6pm go to [email or web address] - password [email or web address] [facility]  Office  [phone]  CC: Primary care physician; [name], MD  Note: This dictation was prepared with Dragon dictation along with smaller phrase technology. Any transcriptional  errors that result from this process are unintentional.

## 2018-03-03 NOTE — Plan of Care (Signed)
Currently in dialysis. Will reattempt visit tomorrow.

## 2018-03-03 NOTE — Progress Notes (Signed)
Nutrition Brief Follow-Up Note  Initial assessment was 4/28 but patient was NPO. Following SLP evaluation today patient was advanced to dysphagia 1 diet with thin liquids.  Patient has ESRD and is on HD. Noted Potassium is WNL but Phosphorus was elevated on 4/26.  Nutrition Intervention: Provide Nepro Shake po TID, each supplement provides 425 kcal and 19 grams protein.  Continue Rena-vite daily.  Willey Blade, MS, [place], LDN Office: [phone] Pager: [phone] After Hours/Weekend Pager: [phone]

## 2018-03-03 NOTE — Evaluation (Signed)
Clinical/Bedside Swallow Evaluation Patient Details  Name: Laurie Steele MRN: [id number] Date of Birth: 01/12/1941  Today's Date: 03/03/2018 Time: SLP Start Time (ACUTE ONLY): 1435 SLP Stop Time (ACUTE ONLY): 1535 SLP Time Calculation (min) (ACUTE ONLY): 60 min  Past Medical History:  Past Medical History:  Diagnosis Date  . Anxiety   . CAD (coronary artery disease)   . Chronic anemia    Dr Inez Pilgrim  . Dysrhythmia   . GERD (gastroesophageal reflux disease)   . Glaucoma   . Gout   . Gout   . Hx of colonic polyps   . Hyperlipidemia   . Hypertension   . Lower back pain   . Miscarriage   . Myocardial infarction ([place])   . 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 Neuropathy   . 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 Peritoneal dialysis status ([place])   . Renal failure    Dr Holley Raring  . Renal insufficiency   . 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 Vaginal delivery    x 4   Past Surgical History:  Past Surgical History:  Procedure Laterality Date  . ABDOMINAL HYSTERECTOMY  1983  . AV FISTULA PLACEMENT Left 09/21/2016   Procedure: ARTERIOVENOUS (AV) FISTULA CREATION ( BRACHIOCEPHALIC );  Surgeon: Katha Cabal, MD;  Location: [facility] ORS;  Service: Vascular;  Laterality: Left;  . BACK SURGERY  6/08   Dr Collier Salina  . BACK SURGERY  1987   disc  . CHOLECYSTECTOMY    . DIALYSIS/PERMA CATHETER REMOVAL N/A 02/25/2017   Procedure: Dialysis/Perma Catheter Removal;  Surgeon: Algernon Huxley, MD;  Location: [facility];  Service: Cardiovascular;  Laterality: N/A;  . INSERTION OF DIALYSIS CATHETER  2017  . PERITONEAL CATHETER INSERTION    . REMOVAL OF A DIALYSIS CATHETER N/A 09/21/2016   Procedure: REMOVAL OF A DIALYSIS CATHETER ( PERITONEAL DIALYSIS CATH );  Surgeon: Katha Cabal, MD;  Location: [facility] ORS;  Service: Vascular;  Laterality: N/A;  . VITRECTOMY AND CATARACT Left  11/04/2017   Procedure: VITRECTOMY AND CATARACT;  Surgeon: Leandrew Koyanagi, MD;  Location: [facility] ORS;  Service: Ophthalmology;  Laterality: Left;  US00:51.6 AP%21.8 EHU31.49 FLUID LOT #[id number]   HPI:  Pt is a 77 year old female with PMHx of anxiety, HLD, HTN, DM with retinopathy and nephropathy, chronic anemia, glaucoma, GERD, gout, CAD, hx MI, ESRD on HD now admitted with acute encephalopathy from UTI/Baclofen. Pt has been lethargic during this admission, now awake but slower speaking when to others.    Assessment / Plan / Recommendation Clinical Impression  Pt appears to present w/ adequate oropharyngeal phase swallow function w/ the trials given; reduced risk for aspiration when following general aspiration precautions. Pt consumed po trials of thin liquids (via straw) and purees w/ no overt s/s of aspiration noted; no decline in vocal qu.aity or respiratory status post trials. Oral phase was wfl for bolus management of the trial consistencies given: puree and thin liquids. Pt achieved appropriate oral clearing post trials. Pt was not given solids d/t Cognitive status, overall weakness. Pt also indicated she was "not hungry" and did not want anything further after trials given. Recommend a Dysphagia level 1(puree) w/ thin liquids; general aspiration precautions; feeding support at meals. Recommend Pills in Puree - crushed as needed for easier, safer swallowing. ST services will continue to f/u next 1-2 days  for diet consistency upgrade as appropriate for pt. NSG updated.  SLP Visit Diagnosis: Dysphagia, oropharyngeal phase (R13.12)    Aspiration Risk  (redcued following general precautions)    Diet Recommendation  Dysphagia level 1 (puree) w/ Thin liquids; general aspiration precautions; Support at meals w/ feeding.   Medication Administration: Crushed with puree(whole if able)    Other  Recommendations Recommended Consults: (Dietician f/u) Oral Care Recommendations: Oral care  BID;Staff/trained caregiver to provide oral care Other Recommendations: (n/a)   Follow up Recommendations Skilled Nursing facility(TBD)      Frequency and Duration min 3x week  2 weeks       Prognosis Prognosis for Safe Diet Advancement: Fair Barriers to Reach Goals: Cognitive deficits(currently) Barriers/Prognosis Comment: weakness      Swallow Study   General Date of Onset: 02/27/18 HPI: Pt is a 77 year old female with PMHx of anxiety, HLD, HTN, DM with retinopathy and nephropathy, chronic anemia, glaucoma, GERD, gout, CAD, hx MI, ESRD on HD now admitted with acute encephalopathy from UTI/Baclofen. Pt has been lethargic during this admission, now awake but slower speaking when to others.  Type of Study: Bedside Swallow Evaluation Previous Swallow Assessment: none recently reported Diet Prior to this Study: NPO Temperature Spikes Noted: No(wbc 7.5) Respiratory Status: Room air History of Recent Intubation: No Behavior/Cognition: Alert;Cooperative;Pleasant mood;Distractible;Requires cueing;Confused(unsure of pt's Cognitive baseline - required cues) Oral Cavity Assessment: Dry Oral Care Completed by SLP: Recent completion by staff Oral Cavity - Dentition: Poor condition;Missing dentition Vision: (unsure) Self-Feeding Abilities: Needs assist;Needs set up;Total assist Patient Positioning: Upright in bed Baseline Vocal Quality: Low vocal intensity Volitional Cough: Cognitively unable to elicit Volitional Swallow: Unable to elicit    Oral/Motor/Sensory Function Overall Oral Motor/Sensory Function: Within functional limits(grossly)   Ice Chips Ice chips: Within functional limits Presentation: Spoon(fed; 3 trials)   Thin Liquid Thin Liquid: Within functional limits Presentation: Straw;Self Fed(assisted; ~8 trials)    Nectar Thick Nectar Thick Liquid: Not tested   Honey Thick Honey Thick Liquid: Not tested   Puree Puree: Within functional limits Presentation: Spoon(fed; 8  trials)   Solid   GO   Solid: Not tested Other Comments: pt appeared too weak; Cognitive decline and slowness          Orinda Kenner, MS, CCC-SLP Kaveh Kissinger 03/03/2018,4:16 PM

## 2018-03-03 NOTE — Progress Notes (Signed)
Pre HD assessment    03/03/18 0940  Neurological  Level of Consciousness Alert  Orientation Level Oriented to person;Oriented to situation  Respiratory  Respiratory Pattern Regular;Unlabored  Chest Assessment Chest expansion symmetrical  Cardiac  ECG Monitor Yes  Vascular  R Radial Pulse +1  L Radial Pulse +1  Edema Generalized  Integumentary  Integumentary (WDL) X  Skin Color Appropriate for ethnicity  Musculoskeletal  Musculoskeletal (WDL) X  Generalized Weakness Yes  Assistive Device None  GU Assessment  Genitourinary (WDL) X  Genitourinary Symptoms  (HD)  Psychosocial  Psychosocial (WDL) WDL  Emotional support given Given to patient

## 2018-03-03 NOTE — NC FL2 (Signed)
[place] LEVEL OF CARE SCREENING TOOL     IDENTIFICATION  Patient Name: Laurie Steele Birthdate: [birth year] Sex: female Admission Date (Current Location): 02/27/2018  [place] and [place] Number:  Engineering geologist and Address:  [facility], [address], [place], [place] [address]      Provider Number: [id number]  Attending Physician Name and Address:  Bettey Costa, MD  Relative Name and Phone Number:       Current Level of Care: Hospital Recommended Level of Care: [place] Prior Approval Number:    Date Approved/Denied:   PASRR Number: ([id number] )  Discharge Plan: SNF    Current Diagnoses: Patient Active Problem List   Diagnosis Date Noted  . Altered mental status 02/28/2018  . Complication of vascular access for dialysis 07/18/2017  . UTI (urinary tract infection) 01/22/2017  . CHF (congestive heart failure) ([place]) 12/11/2016  . Ds DNA antibody positive 07/09/2016  . Pneumonia 07/03/2016  . Anemia 06/17/2016  . Symptomatic anemia 06/15/2016  . Hypoglycemia 05/21/2016  . Chest pain 04/05/2016  . Hypercalcemia 03/14/2016  . Bronchitis 03/03/2012  . Type II or unspecified type diabetes mellitus with renal manifestations, not stated as uncontrolled(250.40) 12/18/2011  . END STAGE RENAL DISEASE 10/23/2010  . DM (diabetes mellitus), type 2 ([place]) 12/13/2009  . NEUROPATHY 12/13/2009  . GOUT 02/15/2009  . SLEEP DISORDER 02/15/2009  . GERD 02/23/2008  . Hyperlipidemia 05/14/2007  . ANEMIA, CHRONIC 05/14/2007  . Essential hypertension 05/14/2007    Orientation RESPIRATION BLADDER Height & Weight     Self, Place  Normal Continent Weight: 174 lb 13.2 oz (79.3 kg) Height:  5\' 7"  (170.2 cm)  BEHAVIORAL SYMPTOMS/MOOD NEUROLOGICAL BOWEL NUTRITION STATUS      Continent Diet(Diet: NPO to be advanced. )  AMBULATORY STATUS COMMUNICATION OF NEEDS Skin   Extensive Assist Verbally Normal                     Personal Care Assistance Level of Assistance  Bathing, Feeding, Dressing Bathing Assistance: Limited assistance Feeding assistance: Independent Dressing Assistance: Limited assistance     Functional Limitations Info  Sight, Hearing, Speech Sight Info: Adequate Hearing Info: Adequate Speech Info: Adequate    SPECIAL CARE FACTORS FREQUENCY  PT (By licensed PT), OT (By licensed OT)(Dialysis MWF at 11:15 am at [facility] in [place].  )     PT Frequency: (5) OT Frequency: (5)            Contractures      Additional Factors Info  Code Status, Allergies Code Status Info: (Full Code. ) Allergies Info: (Alprazolam)           Current Medications (03/03/2018):  This is the current hospital active medication list Current Facility-Administered Medications  Medication Dose Route Frequency Provider Last Rate Last Dose  . acetaminophen (TYLENOL) tablet 650 mg  650 mg Oral Q6H PRN Arta Silence, MD       Or  . acetaminophen (TYLENOL) suppository 650 mg  650 mg Rectal Q6H PRN Arta Silence, MD      . allopurinol (ZYLOPRIM) tablet 200 mg  200 mg Oral QHS Arta Silence, MD      . amitriptyline (ELAVIL) tablet 25 mg  25 mg Oral QHS PRN Arta Silence, MD      . amLODipine (NORVASC) tablet 10 mg  10 mg Oral Daily Arta Silence, MD      . aspirin EC tablet 81 mg  81 mg Oral  Daily Arta Silence, MD      . bisacodyl (DULCOLAX) EC tablet 5 mg  5 mg Oral Daily PRN Arta Silence, MD      . brimonidine (ALPHAGAN) 0.2 % ophthalmic solution 1 drop  1 drop Left Eye TID Arta Silence, MD   1 drop at 03/02/18 2235  . cephALEXin (KEFLEX) capsule 500 mg  500 mg Oral Q12H Mody, Sital, MD      . chlorhexidine (PERIDEX) 0.12 % solution 15 mL  15 mL Mouth Rinse BID Bettey Costa, MD   15 mL at 03/01/18 2257  . cinacalcet (SENSIPAR) tablet 90 mg  90 mg Oral Q lunch Arta Silence, MD      . cloNIDine (CATAPRES - Dosed in mg/24 hr) patch 0.1  mg  0.1 mg Transdermal Weekly Amelia Jo, MD   0.1 mg at 02/28/18 2359  . cloNIDine (CATAPRES) tablet 0.1 mg  0.1 mg Oral 2 times per day on Sun Tue Thu Sat Arta Silence, MD       And  . cloNIDine (CATAPRES) tablet 0.1 mg  0.1 mg Oral Q M,W,F-2000 Sridharan, Prasanna, MD      . clopidogrel (PLAVIX) tablet 75 mg  75 mg Oral Daily Sridharan, Prasanna, MD      . colchicine tablet 0.3 mg  0.3 mg Oral Q Wed,Sat Sridharan, Prasanna, MD      . famotidine (PEPCID) IVPB 20 mg premix  20 mg Intravenous Q24H Bettey Costa, MD   Stopped at 03/02/18 2310  . ferrous sulfate tablet 325 mg  325 mg Oral Q breakfast Arta Silence, MD      . furosemide (LASIX) tablet 40 mg  40 mg Oral Daily Arta Silence, MD      . heparin injection 5,000 Units  5,000 Units Subcutaneous Q8H Bettey Costa, MD   5,000 Units at 03/03/18 1062  . hydrALAZINE (APRESOLINE) injection 10 mg  10 mg Intravenous Q6H Amelia Jo, MD   10 mg at 03/03/18 6948  . hydrALAZINE (APRESOLINE) injection 10 mg  10 mg Intravenous Q6H PRN Bettey Costa, MD      . hydrALAZINE (APRESOLINE) tablet 50 mg  50 mg Oral 2 times per day on Sun Tue Thu Sat Arta Silence, MD       And  . hydrALAZINE (APRESOLINE) tablet 50 mg  50 mg Oral Q M,W,F-2000 Sridharan, Prasanna, MD      . insulin aspart (novoLOG) injection 0-15 Units  0-15 Units Subcutaneous TID WC Sridharan, Prasanna, MD      . ipratropium-albuterol (DUONEB) 0.5-2.5 (3) MG/3ML nebulizer solution 3 mL  3 mL Nebulization Q6H PRN Arta Silence, MD      . isosorbide mononitrate (IMDUR) 24 hr tablet 30 mg  30 mg Oral BID Sridharan, Prasanna, MD      . latanoprost (XALATAN) 0.005 % ophthalmic solution 1 drop  1 drop Both Eyes QHS Arta Silence, MD   1 drop at 03/02/18 2235  . MEDLINE mouth rinse  15 mL Mouth Rinse q12n4p Mody, Sital, MD   15 mL at 03/02/18 1721  . metoprolol succinate (TOPROL-XL) 24 hr tablet 25 mg  25 mg Oral QHS Arta Silence, MD      . metoprolol  tartrate (LOPRESSOR) injection 5 mg  5 mg Intravenous Q4H PRN Mody, Sital, MD      . mirtazapine (REMERON) tablet 15 mg  15 mg Oral QHS Arta Silence, MD      . multivitamin (RENA-VIT) tablet 1 tablet  1 tablet Oral Daily Sridharan, [place],  MD      . ondansetron (ZOFRAN) tablet 4 mg  4 mg Oral Q6H PRN Arta Silence, MD       Or  . ondansetron (ZOFRAN) injection 4 mg  4 mg Intravenous Q6H PRN Arta Silence, MD      . pantoprazole (PROTONIX) EC tablet 40 mg  40 mg Oral Daily Sridharan, Prasanna, MD      . pravastatin (PRAVACHOL) tablet 40 mg  40 mg Oral QHS Sridharan, Prasanna, MD      . senna-docusate (Senokot-S) tablet 1 tablet  1 tablet Oral QHS PRN Arta Silence, MD         Discharge Medications: Please see discharge summary for a list of discharge medications.  Relevant Imaging Results:  Relevant Lab Results:   Additional Information (SSN: [id number])  Trelyn Vanderlinde, Veronia Beets, LCSW

## 2018-03-03 NOTE — Progress Notes (Signed)
Pre HD assessment    03/03/18 0935  Vital Signs  Temp 98.3 F (36.8 C)  Temp Source Oral  Pulse Rate 81  Pulse Rate Source Monitor  Resp 18  BP (!) 145/71  BP Location Right Arm  BP Method Automatic  Patient Position (if appropriate) Lying  Oxygen Therapy  SpO2 100 %  O2 Device Room Air  Pain Assessment  Pain Scale 0-10  Pain Score 0  Dialysis Weight  Weight 81.2 kg (179 lb)  Type of Weight Pre-Dialysis  Time-Out for Hemodialysis  What Procedure? HD  Pt Identifiers(min of two) First/Last Name;MRN/Account#  Correct Site? Yes  Correct Side? Yes  Correct Procedure? Yes  Consents Verified? Yes  Rad Studies Available? N/A  Safety Precautions Reviewed? Yes  Engineer, civil (consulting) Number  (6A)  Station Number 4  UF/Alarm Test Passed  Conductivity: Meter 13.8  Conductivity: Machine  13.7  pH 7.4  Reverse Osmosis main  Normal Saline Lot Number [id number]  Dialyzer Lot Number [id number]  Disposable Set Lot Number [id number]  Machine Temperature 98.6 F (37 C)  Musician and Audible Yes  Blood Lines Intact and Secured Yes  Pre Treatment Patient Checks  Vascular access used during treatment Fistula  Hepatitis B Surface Antigen Results Negative  Date Hepatitis B Surface Antigen Drawn 01/26/17  Hepatitis B Surface Antibody 20  Date Hepatitis B Surface Antibody Drawn 08/12/17  Hemodialysis Consent Verified Yes  Hemodialysis Standing Orders Initiated Yes  ECG (Telemetry) Monitor On Yes  Prime Ordered Normal Saline  Length of  DialysisTreatment -hour(s) 3.5 Hour(s)  Dialyzer Elisio 17H NR  Dialysate 3K, 2.5 Ca  Dialysis Anticoagulant None  Dialysate Flow Ordered 600  Blood Flow Rate Ordered 400 mL/min  Ultrafiltration Goal 1 Liters  Pre Treatment Labs Renal panel;CBC  Dialysis Blood Pressure Support Ordered Normal Saline  Education / Care Plan  Dialysis Education Provided Yes  Documented Education in Care Plan Yes

## 2018-03-03 NOTE — Progress Notes (Signed)
PT Cancellation Note  Patient Details Name: Laurie Steele MRN: [id number] DOB: [birth year]   Cancelled Treatment:    Reason Eval/Treat Not Completed: Other (comment). Currently out of room for HD, unavailable for treatment at this time. Will re-attempt as able.   Tray Klayman 03/03/2018, 11:07 AM Greggory Stallion, PT, DPT [phone]

## 2018-03-03 NOTE — Care Management Important Message (Signed)
Copy of signed IM left in patient's room.    

## 2018-03-03 NOTE — Progress Notes (Signed)
Post HD assessment. Pt tolerated tx well, without c/o. Possible venous stenosis r/t increased venous pressures requiring BFR to be decreased, MD aware. Pt experienced a drop in BP during tx, UF turned off during this time, MD aware. Net UF 539, goal not met.    03/03/18 1354  Vital Signs  Temp 98.3 F (36.8 C)  Temp Source Oral  Pulse Rate 88  Pulse Rate Source Monitor  Resp (!) 24  BP (!) 147/70  BP Location Right Arm  BP Method Automatic  Patient Position (if appropriate) Lying  Oxygen Therapy  SpO2 100 %  O2 Device Room Air  Dialysis Weight  Weight 81.2 kg (179 lb)  Type of Weight Post-Dialysis  Post-Hemodialysis Assessment  Rinseback Volume (mL) 250 mL  KECN 67.8 V  Dialyzer Clearance Lightly streaked  Duration of HD Treatment -hour(s) 3.5 hour(s)  Hemodialysis Intake (mL) 500 mL  UF Total -Machine (mL) 1039 mL  Net UF (mL) 539 mL  Tolerated HD Treatment Yes  AVG/AVF Arterial Site Held (minutes) 10 minutes  AVG/AVF Venous Site Held (minutes) 10 minutes  Education / Care Plan  Dialysis Education Provided Yes  Documented Education in Care Plan Yes

## 2018-03-03 NOTE — Clinical Social Work Note (Addendum)
Clinical Social Work Assessment  Patient Details  Name: Laurie Steele MRN: [id number] Date of Birth: [birth year]  Date of referral:  03/03/18               Reason for consult:  Facility Placement, Discharge Planning                Permission sought to share information with:  Facility Sport and exercise psychologist, Family Supports Permission granted to share information::  Yes, Verbal Permission Granted  Name::      SNF  Agency::   [facility]  Relationship::     Contact Information:     Housing/Transportation Living arrangements for the past 2 months:  Single Family Home(Son Laurie Steele lives with her ) Source of Information:  Patient, Adult Children(SonSonia Steele ) Patient Interpreter Needed:  None Criminal Activity/Legal Involvement Pertinent to Current Situation/Hospitalization:  No - Comment as needed Significant Relationships:  Adult Children Lives with:  Self, Adult Children(Laurie Steele ) Do you feel safe going back to the place where you live?  Yes Need for family participation in patient care:  Yes (Comment)  Care giving concerns:  Patient lives with her adult son Laurie Steele in [place].    Social Worker assessment / plan:  Holiday representative (CSW) met with patient and son Laurie Steele [phone] to discuss discharge planning. PT has recommended SNF at discharge. Patient states that she does not want to go to a facility but son said that she will have to before coming home. CSW informed patient and son that we can not force her to go but we can do a bed search to see what is available. Patient is in agreement to bed search in [facility]. 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 [facility] would [facility]. [facility] is in agreement to see if [facility] has a bed available but has not decided to go to SNF yet. CSW will send bed search out for [facility].   Per son patient goes to dialysis MWF at [facility]  on [address] in [place] at 11:15 am.   Employment status:  Retired Nurse, adult PT Recommendations:  [place] / Referral to community resources:  [place]  Patient/Family's Response to care:  Patient states that she wants to go home instead of SNF but she is understanding that she needs some rehab. She has not agreed to go to SNF yet but will allow CSW to see if a bed is available.   Patient/Family's Understanding of and Emotional Response to Diagnosis, Current Treatment, and Prognosis:  Patient has multiple adult children that are in support of patient going to SNF at discharge. Family assists her at home and her son Laurie Steele lives with her.   Emotional Assessment Appearance:  Appears stated age Attitude/Demeanor/Rapport:    Affect (typically observed):  Pleasant, Quiet Orientation:  Oriented to Self, Oriented to Place, Oriented to  Time, Oriented to Situation Alcohol / Substance use:  Not Applicable Psych involvement (Current and /or in the community):  No (Comment)  Discharge Needs  Concerns to be addressed:  Discharge Planning Concerns Readmission within the last 30 days:  No Current discharge risk:    Barriers to Discharge:  Continued Medical Work up   [employer], [place] 03/03/2018, 11:13 AM

## 2018-03-03 NOTE — Progress Notes (Signed)
[facility]  ROUNDING NOTE   Subjective:   Patient seen during HD Still lethargic   HEMODIALYSIS FLOWSHEET:  Blood Flow Rate (mL/min): 280 mL/min Arterial Pressure (mmHg): -120 mmHg Venous Pressure (mmHg): 290 mmHg Transmembrane Pressure (mmHg): 80 mmHg Ultrafiltration Rate (mL/min): 430 mL/min Dialysate Flow Rate (mL/min): 600 ml/min Conductivity: Machine : 13.7 Conductivity: Machine : 13.7 Dialysis Fluid Bolus: Normal Saline Bolus Amount (mL): 250 mL Dialysate Change: Other (comment)(3k2.5ca)    Objective:  Vital signs in last 24 hours:  Temp:  [98 F (36.7 C)-98.3 F (36.8 C)] 98.3 F (36.8 C) (04/29 0935) Pulse Rate:  [77-93] 93 (04/29 1215) Resp:  [14-28] 19 (04/29 1215) BP: (106-183)/(37-96) 106/37 (04/29 1215) SpO2:  [98 %-100 %] 98 % (04/29 1215) Weight:  [179 lb (81.2 kg)] 179 lb (81.2 kg) (04/29 0935)  Weight change:  Filed Weights   02/27/18 2050 02/28/18 1531 03/03/18 0935  Weight: 170 lb (77.1 kg) 174 lb 13.2 oz (79.3 kg) 179 lb (81.2 kg)    Intake/Output: I/O last 3 completed shifts: In: 250 [IV Piggyback:250] Out: -    Intake/Output this shift:  No intake/output data recorded.  Physical Exam: General: Lethargic   Head: Normocephalic, atraumatic. Moist oral mucosal membranes  Eyes: anicteric  Neck: Supple,   Lungs:  Clear to auscultation  Heart: Regular rate and rhythm  Abdomen:  Soft, nontender  Extremities: no peripheral edema.  Neurologic: Responds to questions  Skin: No lesions  Access: Left AVF    Basic Metabolic Panel: Recent Labs  Lab 02/27/18 2106 02/28/18 0952 03/01/18 0529  NA 139 138 141  K 5.0 3.7 3.9  CL 96* 97* 99*  CO2 26 29 30   GLUCOSE 110* 108* 106*  BUN 49* 56* 32*  CREATININE 6.68* 7.69* 5.58*  CALCIUM 8.1* 7.5* 8.5*  PHOS  --  5.8*  --     Liver Function Tests: Recent Labs  Lab 02/27/18 2106 02/28/18 0952  AST 45*  --   ALT 45  --   ALKPHOS 165*  --   BILITOT 0.7  --   PROT 7.6   --   ALBUMIN 3.4* 3.0*   No results for input(s): LIPASE, AMYLASE in the last 168 hours. Recent Labs  Lab 02/28/18 0658 02/28/18 0953  AMMONIA 20 29    CBC: Recent Labs  Lab 02/27/18 2106 02/28/18 0952 03/01/18 0529  WBC 7.7 6.5 7.5  HGB 12.3 10.7* 12.5  HCT 37.7 32.5* 36.3  MCV 109.8* 109.1* 107.6*  PLT 188 185 200    Cardiac Enzymes: Recent Labs  Lab 02/27/18 2106 02/28/18 0952 02/28/18 1345 02/28/18 2040  TROPONINI 0.04* 0.04* 0.04* 0.04*    BNP: Invalid input(s): POCBNP  CBG: Recent Labs  Lab 03/02/18 1133 03/02/18 1659 03/02/18 2050 03/03/18 0818 03/03/18 0823  GLUCAP 105* 101* 107* 104* 102*    Microbiology: Results for orders placed or performed during the hospital encounter of 02/27/18  Urine Culture     Status: None   Collection Time: 02/28/18 12:59 PM  Result Value Ref Range Status   Specimen Description   Final    URINE, RANDOM Performed at [facility], [address] [place], [place] [address]    Special Requests   Final    NONE Performed at [facility], [address], [place], [place] [address]    Culture   Final    NO GROWTH Performed at [facility], [place] [address], [place], [place] [address]    Report  Status 03/01/2018 FINAL  Final    Coagulation Studies: No results for input(s): LABPROT, INR in the last 72 hours.  Urinalysis: No results for input(s): COLORURINE, LABSPEC, PHURINE, GLUCOSEU, HGBUR, BILIRUBINUR, KETONESUR, PROTEINUR, UROBILINOGEN, NITRITE, LEUKOCYTESUR in the last 72 hours.  Invalid input(s): APPERANCEUR    Imaging: No results found.   Medications:   . famotidine (PEPCID) IV Stopped (03/02/18 2310)   . allopurinol  200 mg Oral QHS  . amLODipine  10 mg Oral Daily  . aspirin EC  81 mg Oral Daily  . brimonidine  1 drop Left Eye TID  . cephALEXin  500 mg Oral Q12H  . chlorhexidine  15 mL Mouth Rinse BID  . cinacalcet  90 mg Oral Q lunch  . cloNIDine  0.1 mg Transdermal  Weekly  . cloNIDine  0.1 mg Oral 2 times per day on Sun Tue Thu Sat   And  . cloNIDine  0.1 mg Oral Q M,W,F-2000  . clopidogrel  75 mg Oral Daily  . colchicine  0.3 mg Oral Q Wed,Sat  . ferrous sulfate  325 mg Oral Q breakfast  . furosemide  40 mg Oral Daily  . heparin  5,000 Units Subcutaneous Q8H  . hydrALAZINE  10 mg Intravenous Q6H  . hydrALAZINE  50 mg Oral 2 times per day on Sun Tue Thu Sat   And  . hydrALAZINE  50 mg Oral Q M,W,F-2000  . insulin aspart  0-15 Units Subcutaneous TID WC  . isosorbide mononitrate  30 mg Oral BID  . latanoprost  1 drop Both Eyes QHS  . mouth rinse  15 mL Mouth Rinse q12n4p  . metoprolol succinate  25 mg Oral QHS  . mirtazapine  15 mg Oral QHS  . multivitamin  1 tablet Oral Daily  . pantoprazole  40 mg Oral Daily  . pravastatin  40 mg Oral QHS   acetaminophen **OR** acetaminophen, amitriptyline, bisacodyl, hydrALAZINE, ipratropium-albuterol, metoprolol tartrate, ondansetron **OR** ondansetron (ZOFRAN) IV, senna-docusate  Assessment/ Plan:  [name] is a 77 y.o. black female with end stage renal disease on hemodialysis, coronary artery disease, hypertension, diabetes mellitus type II, GERD, glaucoma, gout, who presents to [facility] on 02/27/2018 for altered mental status and urinary tract infection.   Laurie Steele    1. End Stage Renal Disease: MWF.   - seen during HD  Next HD on Wednesday   2.  Altered mental status: most likely secondary to baclofen.  Mental status is slowly improving. MRI negative.   3. Secondary Hyperparathyroidism with hypocalcemia and hyperphosphatemia: outpatient PTH 862, - resume binders when able to take PO.   4 Anemia of chronic kidney disease:   - Hgb 12.5 - EPO as outpatient.    LOS: 3 Laurie Steele 4/29/201912:33 PM

## 2018-03-03 NOTE — Progress Notes (Signed)
Clinical Education officer, museum (CSW) presented bed offers to patient's son [name]. He chose [facility]. Per Alger Simons liaison he will start [facility] SNF authorization today.   [employer], LCSW [phone]

## 2018-03-03 NOTE — Clinical Social Work Placement (Addendum)
   CLINICAL SOCIAL WORK PLACEMENT  NOTE  Date:  03/03/2018  Patient Details  Name: Laurie Steele MRN: [id number] Date of Birth: 1941/10/11  Clinical Social Work is seeking post-discharge placement for this patient at the [place] level of care (*CSW will initial, date and re-position this form in  chart as items are completed):  Yes   Patient/family provided with [place] Work Department's list of facilities offering this level of care within the geographic area requested by the patient (or if unable, by the patient's family).  Yes   Patient/family informed of their freedom to choose among providers that offer the needed level of care, that participate in Medicare, Medicaid or managed care program needed by the patient, have an available bed and are willing to accept the patient.  Yes   Patient/family informed of [place]'s ownership interest in [facility] and [facility], as well as of the fact that they are under no obligation to receive care at these facilities.  PASRR submitted to EDS on       PASRR number received on       Existing PASRR number confirmed on 03/03/18     FL2 transmitted to all facilities in geographic area requested by pt/family on 03/03/18     FL2 transmitted to all facilities within larger geographic area on       Patient informed that his/her managed care company has contracts with or will negotiate with certain facilities, including the following:         03-03-18   Patient/family informed of bed offers received.  Patient chooses bed at  [facility] [place]     Physician recommends and patient chooses bed at      Patient to be transferred to  [facility] on  03-04-18.  Patient to be transferred to facility by  [facility]     Patient family notified on  03-04-18 of transfer.  Name of family member notified:   Patient's daughter melody was contacted at [phone].     PHYSICIAN    Additional Comment:    _______________________________________________ Sample, Veronia Beets, LCSW 03/03/2018, 9:34 AM   Updated Jones Broom. [place], MSW, [place]  03/04/2018 2:03 PM

## 2018-03-03 NOTE — Progress Notes (Signed)
HD tx start   03/03/18 0950  Vital Signs  Pulse Rate 85  Pulse Rate Source Monitor  Resp 17  BP (!) 159/96  BP Location Right Arm  BP Method Automatic  Patient Position (if appropriate) Lying  Oxygen Therapy  SpO2 100 %  O2 Device Room Air  During Hemodialysis Assessment  Blood Flow Rate (mL/min) 200 mL/min  Arterial Pressure (mmHg) -60 mmHg  Venous Pressure (mmHg) 110 mmHg  Transmembrane Pressure (mmHg) 70 mmHg  Ultrafiltration Rate (mL/min) 0.43 mL/min  Dialysate Flow Rate (mL/min) 600 ml/min  Conductivity: Machine  13.7  HD Safety Checks Performed Yes  Dialysis Fluid Bolus Normal Saline  Bolus Amount (mL) 250 mL  Dialysate Change  (3k2.5Ca)  Intra-Hemodialysis Comments Tx initiated

## 2018-03-03 NOTE — Progress Notes (Signed)
HD tx end   03/03/18 1344  Vital Signs  Pulse Rate 90  Pulse Rate Source Monitor  Resp 18  BP (!) 159/70  BP Location Right Arm  BP Method Automatic  Patient Position (if appropriate) Lying  Oxygen Therapy  SpO2 100 %  O2 Device Room Air  During Hemodialysis Assessment  Dialysis Fluid Bolus Normal Saline  Bolus Amount (mL) 250 mL  Intra-Hemodialysis Comments Tx completed

## 2018-03-03 NOTE — Progress Notes (Signed)
Post HD assessment    03/03/18 1356  Neurological  Level of Consciousness Alert  Orientation Level Oriented to person  Respiratory  Respiratory Pattern Regular;Unlabored  Chest Assessment Chest expansion symmetrical  Cardiac  Pulse Irregular  ECG Monitor Yes  Vascular  R Radial Pulse +1  L Radial Pulse +1  Integumentary  Integumentary (WDL) X  Skin Color Appropriate for ethnicity  Musculoskeletal  Musculoskeletal (WDL) X  Generalized Weakness Yes  Assistive Device None  GU Assessment  Genitourinary (WDL) X  Genitourinary Symptoms  (HD)  Psychosocial  Psychosocial (WDL) X  Patient Behaviors Flat affect;Cooperative;Calm  Emotional support given Given to patient

## 2018-03-04 LAB — GLUCOSE, CAPILLARY
GLUCOSE-CAPILLARY: 134 mg/dL — AB (ref 65–99)
Glucose-Capillary: 131 mg/dL — ABNORMAL HIGH (ref 65–99)

## 2018-03-04 MED ORDER — INSULIN ASPART 100 UNIT/ML ~~LOC~~ SOLN: 0.0000 [IU] | Freq: Every day | SUBCUTANEOUS | 0 refills | Status: AC

## 2018-03-04 NOTE — Progress Notes (Signed)
Report called to [facility]. Madlyn Frankel, RN

## 2018-03-04 NOTE — Progress Notes (Signed)
Son notified of discharge to [facility]. Madlyn Frankel, RN

## 2018-03-04 NOTE — Discharge Summary (Signed)
[facility] [place] NAME: Laurie Steele    MR#:  [id number]  DATE OF BIRTH:  1941-10-29  DATE OF ADMISSION:  02/27/2018 ADMITTING PHYSICIAN: Amelia Jo, MD  DATE OF DISCHARGE: 03/04/2018  PRIMARY CARE PHYSICIAN: [name], MD    ADMISSION DIAGNOSIS:  Delirium [R41.0] Urinary tract infection without hematuria, site unspecified [N39.0]  DISCHARGE DIAGNOSIS:  Active Problems:   Altered mental status   SECONDARY DIAGNOSIS:   Past Medical History:  Diagnosis Date  . Anxiety   . CAD (coronary artery disease)   . Chronic anemia    Dr Inez Pilgrim  . Dysrhythmia   . GERD (gastroesophageal reflux disease)   . Glaucoma   . Gout   . Gout   . Hx of colonic polyps   . Hyperlipidemia   . Hypertension   . Lower back pain   . Miscarriage   . Myocardial infarction ([place])   . 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 Neuropathy   . 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 Peritoneal dialysis status ([place])   . Renal failure    Dr Holley Raring  . Renal insufficiency   . 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 Vaginal delivery    x 4    HOSPITAL COURSE:  77 year old female with end-stage renal disease on hemodialysis brought in to the emergency room due to altered mental status.  1.  Acute encephalopathy from UTI/Baclofen Work-up including MRI of the brain, CT head, chest x-ray, ABG, TSH, B12, RPR and ammonia level are all normal. Urine culture however was negative but this may be due to the fact the patient had antibiotic's prior to urine culture collection.  She is completed course of treatment.  She should not be continued on baclofen.   2.  End-stage renal disease on hemodialysis: She will continue dialysis as per routine   3.  Elevated troponin/CAD: She will continue metoprolol, isosorbide, Plavix, aspirin Elevated troponin is due  to demand ischemia.  She ruled out for ACS.   4.  Essential hypertension: Continue outpatient regimen 5.  Diabetes: Continue sliding scale  Palliative care consultation requested for outpatient.    DISCHARGE CONDITIONS AND DIET:  Dysphagia level 1 (puree) w/ Thin liquids; general aspiration precautions; Support at meals w/ feeding.   stable    CONSULTS OBTAINED:  Treatment Team:  Arta Silence, MD Lavonia Dana, MD  DRUG ALLERGIES:   Allergies  Allergen Reactions  . Alprazolam Other (See Comments)    Unable to sleep and confusion    DISCHARGE MEDICATIONS:   Allergies as of 03/04/2018      Reactions   Alprazolam Other (See Comments)   Unable to sleep and confusion      Medication List    STOP taking these medications   baclofen 10 MG tablet Commonly known as:  LIORESAL   gabapentin 100 MG capsule Commonly known as:  NEURONTIN   ondansetron 4 MG disintegrating tablet Commonly known as:  ZOFRAN ODT     TAKE these medications   acetaminophen 650 MG CR tablet Commonly known as:  TYLENOL Take 650 mg by mouth every 8 (eight) hours as needed for pain.   allopurinol 100 MG tablet Commonly known as:  ZYLOPRIM Take 200 mg by mouth at bedtime.   amLODipine 10 MG tablet Commonly known as:  NORVASC Take 10 mg by  mouth daily.   aspirin EC 81 MG tablet Take 1 tablet (81 mg total) by mouth daily.   brimonidine 0.2 % ophthalmic solution Commonly known as:  ALPHAGAN Place 1 drop into the left eye 3 (three) times daily.   cinacalcet 90 MG tablet Commonly known as:  SENSIPAR Take 90 mg by mouth daily. With lunch   cloNIDine 0.1 MG tablet Commonly known as:  CATAPRES Take 0.1 mg by mouth 2 (two) times daily. TAKE 0.1MG  BY MOUTH ON Sunday,TUESDAY,THURSDAY, AND SATURDAY   clopidogrel 75 MG tablet Commonly known as:  PLAVIX Take 1 tablet (75 mg total) by mouth daily.   colchicine 0.6 MG tablet Take 0.5 tablets (0.3 mg total) by mouth 2 (two) times a  week.   docusate sodium 100 MG capsule Commonly known as:  COLACE Take 1 capsule (100 mg total) by mouth daily as needed for mild constipation.   famotidine 20 MG tablet Commonly known as:  PEPCID Take 20 mg by mouth at bedtime.   feeding supplement (ENSURE ENLIVE) Liqd Take 237 mLs by mouth 2 (two) times daily between meals.   ferrous sulfate 325 (65 FE) MG tablet Take 1 tablet (325 mg total) by mouth daily with breakfast.   fluticasone 50 MCG/ACT nasal spray Commonly known as:  FLONASE Place 2 sprays into both nostrils daily.   furosemide 40 MG tablet Commonly known as:  LASIX Take 1 tablet (40 mg total) by mouth daily.   glimepiride 1 MG tablet Commonly known as:  AMARYL Take 1 mg by mouth daily with breakfast.   hydrALAZINE 50 MG tablet Commonly known as:  APRESOLINE Take 50 mg by mouth 2 (two) times daily. ONLY ON Tuesday,THURSDAY,SATURDAY ,AND SUNDAY   insulin aspart 100 UNIT/ML injection Commonly known as:  novoLOG Inject 0-5 Units into the skin at bedtime. CBG < 70: implement hypoglycemia protocol  CBG 70 - 120: 0 units  CBG 121 - 150: 2 units  CBG 151 - 200: 3 units  CBG 201 - 250: 5 units  CBG 251 - 300: 8 units  CBG 301 - 350: 11 units  CBG 351 - 400: 15 units What changed:  additional instructions   ipratropium 0.03 % nasal spray Commonly known as:  ATROVENT Place 1 spray into both nostrils 3 (three) times daily.   ipratropium-albuterol 0.5-2.5 (3) MG/3ML Soln Commonly known as:  DUONEB Take 3 mLs by nebulization every 6 (six) hours as needed.   isosorbide mononitrate 30 MG 24 hr tablet Commonly known as:  IMDUR Take 30 mg by mouth 2 (two) times daily.   latanoprost 0.005 % ophthalmic solution Commonly known as:  XALATAN Place 1 drop into both eyes at bedtime.   metoprolol succinate 25 MG 24 hr tablet Commonly known as:  TOPROL-XL Take 1 tablet (25 mg total) by mouth at bedtime.   mirtazapine 15 MG tablet Commonly known as:  REMERON Take  15 mg by mouth at bedtime.   multivitamin Tabs tablet Take 1 tablet by mouth daily.   pantoprazole 40 MG tablet Commonly known as:  PROTONIX Take 1 tablet (40 mg total) by mouth daily.   pravastatin 40 MG tablet Commonly known as:  PRAVACHOL Take 40 mg by mouth at bedtime.   sevelamer 800 MG tablet Commonly known as:  RENAGEL Take 800 mg by mouth 3 (three) times daily with meals.         Today   CHIEF COMPLAINT:    No acute events overnight.  Patient is minimally confused.  She is tolerating her diet.   VITAL SIGNS:  Blood pressure (!) 141/51, pulse 79, temperature 98.7 F (37.1 C), temperature source Oral, resp. rate 18, height 5\' 7"  (1.702 m), weight 81.2 kg (179 lb), SpO2 98 %.   REVIEW OF SYSTEMS:  Review of Systems  Constitutional: Positive for malaise/fatigue. Negative for chills and fever.  HENT: Negative.  Negative for ear discharge, ear pain, hearing loss, nosebleeds and sore throat.   Eyes: Negative.  Negative for blurred vision and pain.  Respiratory: Negative.  Negative for cough, hemoptysis, shortness of breath and wheezing.   Cardiovascular: Negative.  Negative for chest pain, palpitations and leg swelling.  Gastrointestinal: Negative.  Negative for abdominal pain, blood in stool, diarrhea, nausea and vomiting.  Genitourinary: Negative.  Negative for dysuria.  Musculoskeletal: Negative.  Negative for back pain.  Skin: Negative.   Neurological: Negative for dizziness, tremors, speech change, focal weakness, seizures and headaches.  Endo/Heme/Allergies: Negative.  Does not bruise/bleed easily.  Psychiatric/Behavioral: Positive for memory loss. Negative for depression, hallucinations and suicidal ideas.     PHYSICAL EXAMINATION:  GENERAL:  77 y.o.-year-old patient lying in the bed with no acute distress.  NECK:  Supple, no jugular venous distention. No thyroid enlargement, no tenderness.  LUNGS: Normal breath sounds bilaterally, no wheezing,  rales,rhonchi  No use of accessory muscles of respiration.  CARDIOVASCULAR: S1, S2 normal. No murmurs, rubs, or gallops.  ABDOMEN: Soft, non-tender, non-distended. Bowel sounds present. No organomegaly or mass.  EXTREMITIES: No pedal edema, cyanosis, or clubbing.  PSYCHIATRIC: The patient is alert and oriented x name and place not time.  SKIN: No obvious rash, lesion, or ulcer.   DATA REVIEW:   CBC Recent Labs  Lab 03/01/18 0529  WBC 7.5  HGB 12.5  HCT 36.3  PLT 200    Chemistries  Recent Labs  Lab 02/27/18 2106  03/01/18 0529  NA 139   < > 141  K 5.0   < > 3.9  CL 96*   < > 99*  CO2 26   < > 30  GLUCOSE 110*   < > 106*  BUN 49*   < > 32*  CREATININE 6.68*   < > 5.58*  CALCIUM 8.1*   < > 8.5*  AST 45*  --   --   ALT 45  --   --   ALKPHOS 165*  --   --   BILITOT 0.7  --   --    < > = values in this interval not displayed.    Cardiac Enzymes Recent Labs  Lab 02/28/18 0952 02/28/18 1345 02/28/18 2040  TROPONINI 0.04* 0.04* 0.04*    Microbiology Results  @MICRORSLT48 @  RADIOLOGY:  No results found.    Allergies as of 03/04/2018      Reactions   Alprazolam Other (See Comments)   Unable to sleep and confusion      Medication List    STOP taking these medications   baclofen 10 MG tablet Commonly known as:  LIORESAL   gabapentin 100 MG capsule Commonly known as:  NEURONTIN   ondansetron 4 MG disintegrating tablet Commonly known as:  ZOFRAN ODT     TAKE these medications   acetaminophen 650 MG CR tablet Commonly known as:  TYLENOL Take 650 mg by mouth every 8 (eight) hours as needed for pain.   allopurinol 100 MG tablet Commonly known as:  ZYLOPRIM Take 200 mg by mouth at bedtime.   amLODipine 10 MG tablet Commonly known as:  NORVASC Take 10 mg by mouth daily.   aspirin EC 81 MG tablet Take 1 tablet (81 mg total) by mouth daily.   brimonidine 0.2 % ophthalmic solution Commonly known as:  ALPHAGAN Place 1 drop into the left eye 3  (three) times daily.   cinacalcet 90 MG tablet Commonly known as:  SENSIPAR Take 90 mg by mouth daily. With lunch   cloNIDine 0.1 MG tablet Commonly known as:  CATAPRES Take 0.1 mg by mouth 2 (two) times daily. TAKE 0.1MG  BY MOUTH ON Sunday,TUESDAY,THURSDAY, AND SATURDAY   clopidogrel 75 MG tablet Commonly known as:  PLAVIX Take 1 tablet (75 mg total) by mouth daily.   colchicine 0.6 MG tablet Take 0.5 tablets (0.3 mg total) by mouth 2 (two) times a week.   docusate sodium 100 MG capsule Commonly known as:  COLACE Take 1 capsule (100 mg total) by mouth daily as needed for mild constipation.   famotidine 20 MG tablet Commonly known as:  PEPCID Take 20 mg by mouth at bedtime.   feeding supplement (ENSURE ENLIVE) Liqd Take 237 mLs by mouth 2 (two) times daily between meals.   ferrous sulfate 325 (65 FE) MG tablet Take 1 tablet (325 mg total) by mouth daily with breakfast.   fluticasone 50 MCG/ACT nasal spray Commonly known as:  FLONASE Place 2 sprays into both nostrils daily.   furosemide 40 MG tablet Commonly known as:  LASIX Take 1 tablet (40 mg total) by mouth daily.   glimepiride 1 MG tablet Commonly known as:  AMARYL Take 1 mg by mouth daily with breakfast.   hydrALAZINE 50 MG tablet Commonly known as:  APRESOLINE Take 50 mg by mouth 2 (two) times daily. ONLY ON Tuesday,THURSDAY,SATURDAY ,AND SUNDAY   insulin aspart 100 UNIT/ML injection Commonly known as:  novoLOG Inject 0-5 Units into the skin at bedtime. CBG < 70: implement hypoglycemia protocol  CBG 70 - 120: 0 units  CBG 121 - 150: 2 units  CBG 151 - 200: 3 units  CBG 201 - 250: 5 units  CBG 251 - 300: 8 units  CBG 301 - 350: 11 units  CBG 351 - 400: 15 units What changed:  additional instructions   ipratropium 0.03 % nasal spray Commonly known as:  ATROVENT Place 1 spray into both nostrils 3 (three) times daily.   ipratropium-albuterol 0.5-2.5 (3) MG/3ML Soln Commonly known as:  DUONEB Take 3  mLs by nebulization every 6 (six) hours as needed.   isosorbide mononitrate 30 MG 24 hr tablet Commonly known as:  IMDUR Take 30 mg by mouth 2 (two) times daily.   latanoprost 0.005 % ophthalmic solution Commonly known as:  XALATAN Place 1 drop into both eyes at bedtime.   metoprolol succinate 25 MG 24 hr tablet Commonly known as:  TOPROL-XL Take 1 tablet (25 mg total) by mouth at bedtime.   mirtazapine 15 MG tablet Commonly known as:  REMERON Take 15 mg by mouth at bedtime.   multivitamin Tabs tablet Take 1 tablet by mouth daily.   pantoprazole 40 MG tablet Commonly known as:  PROTONIX Take 1 tablet (40 mg total) by mouth daily.   pravastatin 40 MG tablet Commonly known as:  PRAVACHOL Take 40 mg by mouth at bedtime.   sevelamer 800 MG tablet Commonly known as:  RENAGEL Take 800 mg by mouth 3 (three) times daily with meals.        }   Management plans discussed with the patient's family and they are  in agreement. Stable for discharge   Patient should follow up with pcp  CODE STATUS:     Code Status Orders  (From admission, onward)        Start     Ordered   02/28/18 0127  Full code  Continuous     02/28/18 0126    Code Status History    Date Active Date Inactive Code Status Order ID Comments User Context   01/22/2017 1902 01/26/2017 2158 Full Code [id number]  Hillary Bow, MD ED   12/11/2016 1344 12/14/2016 1705 Full Code [id number]  Gladstone Lighter, MD Inpatient   07/03/2016 2015 07/05/2016 1943 Full Code [id number]  Vaughan Basta, MD Inpatient   06/15/2016 1915 06/18/2016 2026 Full Code [id number]  Harvie Bridge, DO Inpatient   05/21/2016 1628 05/28/2016 1811 Full Code [id number]  Henreitta Leber, MD Inpatient   04/05/2016 1614 04/06/2016 2107 Full Code [id number]  Loletha Grayer, MD ED   03/14/2016 2350 03/16/2016 1534 Full Code [id number]  Max Sane, MD Inpatient      TOTAL TIME TAKING CARE OF THIS PATIENT: 38 minutes.    Note: This dictation  was prepared with Dragon dictation along with smaller phrase technology. Any transcriptional errors that result from this process are unintentional.  Keelee Yankey M.D on 03/04/2018 at 11:09 AM  Between 7am to 6pm - Pager - [phone] After 6pm go to [email or web address] - password [email or web address]  Office  [phone]  CC: Primary care physician; [name], MD

## 2018-03-04 NOTE — Progress Notes (Signed)
New referral for out patient PALLIATIVE to follow at [facility], received from [place]. Plan is for discharge today. Patient information faxed to referral. Flo Shanks RN, BSN, [facility] [place], hospital  Liaison [phone]

## 2018-03-04 NOTE — Progress Notes (Signed)
Patient discharged via EMS. Joice Nazario S, RN  

## 2018-03-04 NOTE — Progress Notes (Signed)
EMS called for transport. Jeno Calleros S, RN  

## 2018-03-04 NOTE — Clinical Social Work Note (Addendum)
CSW received referral that patient will need palliative to follow at SNF.  CSW contacted [facility] nurse liaison regarding orders received.  Patient to be d/c'ed today to [facility] room 611.  Patient and family agreeable to plans will transport via ems RN to call report [phone] 600 hall nurse.  CSW spoke to patient's daughter Melody Macchia [phone] and she is aware of patient's discharge plan.  Evette Cristal, MSW, [place]

## 2018-03-04 NOTE — Plan of Care (Signed)
Patient is confused, not alert to time, place, or president. Spoke with Dr. Benjie Karvonen, patient is discharging to facility today. She states she will recommend outpatient palliative.

## 2018-03-04 NOTE — Progress Notes (Signed)
Physical Therapy Treatment Patient Details Name: Laurie Steele MRN: [id number] DOB: [birth year] Today's Date: 03/04/2018    History of Present Illness Pt is a 77 year old female admitted for AMS and becoming unresponsive s/p fall at home.  PMH includes MI, renal unsufficiency, stroke, NIDDM, gout and anxiety.    PT Comments    Pt awoke easily but kept eyes closed through most of session.  Participated in exercises as described below. During ex, pt incontinent of BM.  Rolling left/right with good effort but pt still required mod/max a x 1 to roll fully and initiate.  To edge of bed with max a x 2.  Unable to remain sitting without mod assist.  Post lean.  Pt was able to stand at bedside with max a x 2 for 20 seconds and achieve full upright.  After short seated rest, she agreed to get up in chair and max a x 2 stand pivot was performed to chair.  Pt happy to be out of bed this morning.  Discussed mobility with nurse tech and gait belt left on bed for return transfer.     Follow Up Recommendations  SNF     Equipment Recommendations       Recommendations for Other Services       Precautions / Restrictions Precautions Precautions: Fall Precaution Comments: Recent fall-HFR Restrictions Weight Bearing Restrictions: No    Mobility  Bed Mobility Overal bed mobility: Needs Assistance Bed Mobility: Rolling Rolling: Mod assist   Supine to sit: Max assist;+2 for physical assistance     General bed mobility comments: attempts to assist with rolling with good effort,   Transfers Overall transfer level: Needs assistance Equipment used: None Transfers: Sit to/from [employer] Transfers Sit to Stand: Max assist;+2 physical assistance Stand pivot transfers: Max assist;+2 physical assistance          Ambulation/Gait             General Gait Details: unable   Stairs             Wheelchair Mobility    Modified Rankin (Stroke Patients Only)        Balance Overall balance assessment: Needs assistance Sitting-balance support: Feet supported Sitting balance-Leahy Scale: Poor     Standing balance support: No upper extremity supported Standing balance-Leahy Scale: Zero Standing balance comment: uanble to stand without full support                            Cognition Arousal/Alertness: Lethargic Behavior During Therapy: WFL for tasks assessed/performed Overall Cognitive Status: No family/caregiver present to determine baseline cognitive functioning                                        Exercises Other Exercises Other Exercises: BLE AAROM/PROM for ankle pumps, heel slides, SLR x 10    General Comments        Pertinent Vitals/Pain Pain Assessment: No/denies pain    Home Living                      Prior Function            PT Goals (current goals can now be found in the care plan section) Progress towards PT goals: Progressing toward goals    Frequency    Min 2X/week  PT Plan Current plan remains appropriate    Co-evaluation              AM-PAC PT "6 Clicks" Daily Activity  Outcome Measure  Difficulty turning over in bed (including adjusting bedclothes, sheets and blankets)?: Unable Difficulty moving from lying on back to sitting on the side of the bed? : Unable Difficulty sitting down on and standing up from a chair with arms (e.g., wheelchair, bedside commode, etc,.)?: Unable Help needed moving to and from a bed to chair (including a wheelchair)?: Total Help needed walking in hospital room?: Total Help needed climbing 3-5 steps with a railing? : Total 6 Click Score: 6    End of Session Equipment Utilized During Treatment: Gait belt Activity Tolerance: Patient limited by fatigue;Patient limited by lethargy Patient left: in chair;with chair alarm set;with call bell/phone within reach Nurse Communication: Mobility status       Time: 2505-3976 PT Time  Calculation (min) (ACUTE ONLY): 18 min  Charges:  $Therapeutic Activity: 8-22 mins                    G Codes:      Chesley Noon, PTA 03/04/18, 9:45 AM

## 2018-03-04 NOTE — Progress Notes (Signed)
[facility]  ROUNDING NOTE   Subjective:   Patient is doing fair This morning, she was sitting in the chair slumped over, almost slid down She was helped up to the bed Denies any acute complaints Appetite remains poor Did not each much breakfast.  She states she does not like the food.   Objective:  Vital signs in last 24 hours:  Temp:  [98.2 F (36.8 C)-98.7 F (37.1 C)] 98.7 F (37.1 C) (04/30 0508) Pulse Rate:  [79-93] 79 (04/30 0527) Resp:  [18] 18 (04/30 0508) BP: (124-172)/(50-70) 141/51 (04/30 0527) SpO2:  [93 %-100 %] 98 % (04/30 0527)  Weight change:  Filed Weights   02/28/18 1531 03/03/18 0935 03/03/18 1354  Weight: 174 lb 13.2 oz (79.3 kg) 179 lb (81.2 kg) 179 lb (81.2 kg)    Intake/Output: I/O last 3 completed shifts: In: 250 [IV Piggyback:250] Out: 539 [Other:539]   Intake/Output this shift:  No intake/output data recorded.  Physical Exam: General:  No acute distress, laying in the bed  Head: Normocephalic, atraumatic. Moist oral mucosal membranes  Eyes: anicteric  Neck: Supple,   Lungs:  Clear to auscultation  Heart: Regular rate and rhythm  Abdomen:  Soft, nontender  Extremities: no peripheral edema.  Neurologic:  Able to answer questions although speech is not very clear  Skin: No lesions  Access: Left AVF    Basic Metabolic Panel: Recent Labs  Lab 02/27/18 2106 02/28/18 0952 03/01/18 0529  NA 139 138 141  K 5.0 3.7 3.9  CL 96* 97* 99*  CO2 26 29 30   GLUCOSE 110* 108* 106*  BUN 49* 56* 32*  CREATININE 6.68* 7.69* 5.58*  CALCIUM 8.1* 7.5* 8.5*  PHOS  --  5.8*  --     Liver Function Tests: Recent Labs  Lab 02/27/18 2106 02/28/18 0952  AST 45*  --   ALT 45  --   ALKPHOS 165*  --   BILITOT 0.7  --   PROT 7.6  --   ALBUMIN 3.4* 3.0*   No results for input(s): LIPASE, AMYLASE in the last 168 hours. Recent Labs  Lab 02/28/18 0658 02/28/18 0953  AMMONIA 20 29    CBC: Recent Labs  Lab 02/27/18 2106  02/28/18 0952 03/01/18 0529  WBC 7.7 6.5 7.5  HGB 12.3 10.7* 12.5  HCT 37.7 32.5* 36.3  MCV 109.8* 109.1* 107.6*  PLT 188 185 200    Cardiac Enzymes: Recent Labs  Lab 02/27/18 2106 02/28/18 0952 02/28/18 1345 02/28/18 2040  TROPONINI 0.04* 0.04* 0.04* 0.04*    BNP: Invalid input(s): POCBNP  CBG: Recent Labs  Lab 03/03/18 1430 03/03/18 1657 03/03/18 2043 03/04/18 0757 03/04/18 1154  GLUCAP 101* 173* 163* 134* 131*    Microbiology: Results for orders placed or performed during the hospital encounter of 02/27/18  Urine Culture     Status: None   Collection Time: 02/28/18 12:59 PM  Result Value Ref Range Status   Specimen Description   Final    URINE, RANDOM Performed at [facility], [address], [place], [place] [address]    Special Requests   Final    NONE Performed at [facility], [address], [place], [place] [address]    Culture   Final    NO GROWTH Performed at [facility], [place] [address], [place], [place] [address]    Report Status 03/01/2018 FINAL  Final    Coagulation Studies: No results for input(s): LABPROT, INR in the last  72 hours.  Urinalysis: No results for input(s): COLORURINE, LABSPEC, PHURINE, GLUCOSEU, HGBUR, BILIRUBINUR, KETONESUR, PROTEINUR, UROBILINOGEN, NITRITE, LEUKOCYTESUR in the last 72 hours.  Invalid input(s): APPERANCEUR    Imaging: No results found.   Medications:   . famotidine (PEPCID) IV Stopped (03/03/18 2327)   . allopurinol  200 mg Oral QHS  . amLODipine  10 mg Oral Daily  . aspirin EC  81 mg Oral Daily  . brimonidine  1 drop Left Eye TID  . cephALEXin  500 mg Oral Q12H  . chlorhexidine  15 mL Mouth Rinse BID  . cinacalcet  90 mg Oral Q lunch  . cloNIDine  0.1 mg Transdermal Weekly  . cloNIDine  0.1 mg Oral 2 times per day on Sun Tue Thu Sat   And  . cloNIDine  0.1 mg Oral Q M,W,F-2000  . clopidogrel  75 mg Oral Daily  . colchicine  0.3 mg Oral Q Wed,Sat  . feeding  supplement (NEPRO CARB STEADY)  237 mL Oral TID BM  . ferrous sulfate  325 mg Oral Q breakfast  . furosemide  40 mg Oral Daily  . heparin  5,000 Units Subcutaneous Q8H  . hydrALAZINE  50 mg Oral 2 times per day on Sun Tue Thu Sat   And  . hydrALAZINE  50 mg Oral Q M,W,F-2000  . insulin aspart  0-15 Units Subcutaneous TID WC  . isosorbide mononitrate  30 mg Oral BID  . latanoprost  1 drop Both Eyes QHS  . mouth rinse  15 mL Mouth Rinse q12n4p  . metoprolol succinate  25 mg Oral QHS  . mirtazapine  15 mg Oral QHS  . multivitamin  1 tablet Oral Daily  . pantoprazole  40 mg Oral Daily  . pravastatin  40 mg Oral QHS   acetaminophen **OR** acetaminophen, amitriptyline, bisacodyl, hydrALAZINE, ipratropium-albuterol, metoprolol tartrate, ondansetron **OR** ondansetron (ZOFRAN) IV, senna-docusate  Assessment/ Plan:  [name] is a 77 y.o. black female with end stage renal disease on hemodialysis, coronary artery disease, hypertension, diabetes mellitus type II, GERD, glaucoma, gout, who presents to [facility] on 02/27/2018 for altered mental status and urinary tract infection.   [place]    1. End Stage Renal Disease: MWF.   Next HD on Wednesday   2.  Altered mental status: most likely secondary to baclofen.  Mental status is slowly improving. MRI negative.   3. Secondary Hyperparathyroidism with hypocalcemia and hyperphosphatemia: outpatient PTH 862, - resume binders when able to take PO.   4 Anemia of chronic kidney disease:   - Hgb 12.5 - EPO as outpatient.    LOS: [place] 4/30/20191:59 PM

## 2018-04-08 ENCOUNTER — Other Ambulatory Visit: Payer: Self-pay

## 2018-04-08 ENCOUNTER — Observation Stay: Payer: Medicare Other

## 2018-04-08 ENCOUNTER — Emergency Department: Payer: Medicare Other

## 2018-04-08 ENCOUNTER — Observation Stay
Admission: EM | Admit: 2018-04-08 | Discharge: 2018-04-09 | Disposition: A | Discharge status: Home Health | Payer: Medicare Other | Attending: Internal Medicine | Admitting: Internal Medicine

## 2018-04-08 DIAGNOSIS — I132 Hypertensive heart and chronic kidney disease with heart failure and with stage 5 chronic kidney disease, or end stage renal disease: Secondary | ICD-10-CM | POA: Insufficient documentation

## 2018-04-08 DIAGNOSIS — Z87891 Personal history of nicotine dependence: Secondary | ICD-10-CM | POA: Insufficient documentation

## 2018-04-08 DIAGNOSIS — K219 Gastro-esophageal reflux disease without esophagitis: Secondary | ICD-10-CM | POA: Insufficient documentation

## 2018-04-08 DIAGNOSIS — E13319 Other specified diabetes mellitus with unspecified diabetic retinopathy without macular edema: Secondary | ICD-10-CM | POA: Insufficient documentation

## 2018-04-08 DIAGNOSIS — H409 Unspecified glaucoma: Secondary | ICD-10-CM | POA: Diagnosis not present

## 2018-04-08 DIAGNOSIS — I69951 Hemiplegia and hemiparesis following unspecified cerebrovascular disease affecting right dominant side: Secondary | ICD-10-CM | POA: Insufficient documentation

## 2018-04-08 DIAGNOSIS — N2581 Secondary hyperparathyroidism of renal origin: Secondary | ICD-10-CM | POA: Diagnosis not present

## 2018-04-08 DIAGNOSIS — Z79899 Other long term (current) drug therapy: Secondary | ICD-10-CM | POA: Insufficient documentation

## 2018-04-08 DIAGNOSIS — E1340 Other specified diabetes mellitus with diabetic neuropathy, unspecified: Secondary | ICD-10-CM | POA: Insufficient documentation

## 2018-04-08 DIAGNOSIS — Z888 Allergy status to other drugs, medicaments and biological substances status: Secondary | ICD-10-CM | POA: Insufficient documentation

## 2018-04-08 DIAGNOSIS — Z7951 Long term (current) use of inhaled steroids: Secondary | ICD-10-CM | POA: Insufficient documentation

## 2018-04-08 DIAGNOSIS — I2510 Atherosclerotic heart disease of native coronary artery without angina pectoris: Secondary | ICD-10-CM | POA: Diagnosis not present

## 2018-04-08 DIAGNOSIS — E1322 Other specified diabetes mellitus with diabetic chronic kidney disease: Secondary | ICD-10-CM | POA: Diagnosis not present

## 2018-04-08 DIAGNOSIS — N186 End stage renal disease: Secondary | ICD-10-CM | POA: Insufficient documentation

## 2018-04-08 DIAGNOSIS — Z794 Long term (current) use of insulin: Secondary | ICD-10-CM | POA: Insufficient documentation

## 2018-04-08 DIAGNOSIS — Z7902 Long term (current) use of antithrombotics/antiplatelets: Secondary | ICD-10-CM | POA: Insufficient documentation

## 2018-04-08 DIAGNOSIS — Z7982 Long term (current) use of aspirin: Secondary | ICD-10-CM | POA: Diagnosis not present

## 2018-04-08 DIAGNOSIS — I252 Old myocardial infarction: Secondary | ICD-10-CM | POA: Insufficient documentation

## 2018-04-08 DIAGNOSIS — M109 Gout, unspecified: Secondary | ICD-10-CM | POA: Insufficient documentation

## 2018-04-08 DIAGNOSIS — E785 Hyperlipidemia, unspecified: Secondary | ICD-10-CM | POA: Insufficient documentation

## 2018-04-08 DIAGNOSIS — R251 Tremor, unspecified: Principal | ICD-10-CM | POA: Insufficient documentation

## 2018-04-08 DIAGNOSIS — D631 Anemia in chronic kidney disease: Secondary | ICD-10-CM | POA: Insufficient documentation

## 2018-04-08 DIAGNOSIS — Z992 Dependence on renal dialysis: Secondary | ICD-10-CM | POA: Insufficient documentation

## 2018-04-08 LAB — GLUCOSE, CAPILLARY
GLUCOSE-CAPILLARY: 77 mg/dL (ref 65–99)
Glucose-Capillary: 234 mg/dL — ABNORMAL HIGH (ref 65–99)
Glucose-Capillary: 230 mg/dL — ABNORMAL HIGH (ref 65–99)

## 2018-04-08 LAB — CBC WITH DIFFERENTIAL/PLATELET
Basophils Absolute: 0.1 10*3/uL (ref 0–0.1)
Basophils Relative: 1 %
EOS ABS: 0.2 10*3/uL (ref 0–0.7)
EOS PCT: 3 %
HEMATOCRIT: 37.1 % (ref 35.0–47.0)
Hemoglobin: 12.7 g/dL (ref 12.0–16.0)
Lymphocytes Relative: 26 %
Lymphs Abs: 2 10*3/uL (ref 1.0–3.6)
MCH: 37.9 pg — ABNORMAL HIGH (ref 26.0–34.0)
MCHC: 34.2 g/dL (ref 32.0–36.0)
MCV: 110.8 fL — ABNORMAL HIGH (ref 80.0–100.0)
MONO ABS: 0.9 10*3/uL (ref 0.2–0.9)
MONOS PCT: 12 %
NEUTROS ABS: 4.5 10*3/uL (ref 1.4–6.5)
Neutrophils Relative %: 58 %
PLATELETS: 174 10*3/uL (ref 150–440)
RBC: 3.35 MIL/uL — ABNORMAL LOW (ref 3.80–5.20)
RDW: 16.1 % — AB (ref 11.5–14.5)
WBC: 7.7 10*3/uL (ref 3.6–11.0)

## 2018-04-08 LAB — COMPREHENSIVE METABOLIC PANEL
ALBUMIN: 3.6 g/dL (ref 3.5–5.0)
ALT: 31 U/L (ref 14–54)
ANION GAP: 15 (ref 5–15)
AST: 33 U/L (ref 15–41)
Alkaline Phosphatase: 173 U/L — ABNORMAL HIGH (ref 38–126)
BILIRUBIN TOTAL: 0.7 mg/dL (ref 0.3–1.2)
BUN: 48 mg/dL — ABNORMAL HIGH (ref 6–20)
CHLORIDE: 99 mmol/L — AB (ref 101–111)
CO2: 28 mmol/L (ref 22–32)
Calcium: 8.7 mg/dL — ABNORMAL LOW (ref 8.9–10.3)
Creatinine, Ser: 5.94 mg/dL — ABNORMAL HIGH (ref 0.44–1.00)
GFR calc Af Amer: 7 mL/min — ABNORMAL LOW (ref >60)
GFR calc non Af Amer: 6 mL/min — ABNORMAL LOW (ref >60)
Glucose, Bld: 131 mg/dL — ABNORMAL HIGH (ref 65–99)
POTASSIUM: 3.6 mmol/L (ref 3.5–5.1)
Sodium: 142 mmol/L (ref 135–145)
TOTAL PROTEIN: 7.8 g/dL (ref 6.5–8.1)

## 2018-04-08 LAB — PHOSPHORUS: Phosphorus: 5.5 mg/dL — ABNORMAL HIGH (ref 2.5–4.6)

## 2018-04-08 LAB — VITAMIN B12: Vitamin B-12: 775 pg/mL (ref 180–914)

## 2018-04-08 LAB — MRSA PCR SCREENING: MRSA BY PCR: NEGATIVE

## 2018-04-08 LAB — TSH: TSH: 3.304 u[IU]/mL (ref 0.350–4.500)

## 2018-04-08 LAB — MAGNESIUM: Magnesium: 2.1 mg/dL (ref 1.7–2.4)

## 2018-04-08 MED ORDER — SENNOSIDES-DOCUSATE SODIUM 8.6-50 MG PO TABS: 1.0000 | ORAL_TABLET | Freq: Every evening | ORAL | Status: DC | PRN

## 2018-04-08 MED ORDER — AMLODIPINE BESYLATE 10 MG PO TABS
10.0000 mg | ORAL_TABLET | Freq: Every day | ORAL | Status: DC
  Administered 2018-04-09: 10 mg via ORAL
  Filled 2018-04-08: qty 1

## 2018-04-08 MED ORDER — DOCUSATE SODIUM 100 MG PO CAPS
100.0000 mg | ORAL_CAPSULE | Freq: Every day | ORAL | Status: DC | PRN
Start: 2018-04-08 — End: 2018-04-09

## 2018-04-08 MED ORDER — ACETAMINOPHEN 650 MG RE SUPP: 650.0000 mg | Freq: Four times a day (QID) | RECTAL | Status: DC | PRN

## 2018-04-08 MED ORDER — ALLOPURINOL 100 MG PO TABS
200.0000 mg | ORAL_TABLET | Freq: Every day | ORAL | Status: DC
  Administered 2018-04-08: 200 mg via ORAL
  Filled 2018-04-08: qty 2

## 2018-04-08 MED ORDER — CLOPIDOGREL BISULFATE 75 MG PO TABS
75.0000 mg | ORAL_TABLET | Freq: Every day | ORAL | Status: DC
  Administered 2018-04-08 – 2018-04-09 (×2): 75 mg via ORAL
  Filled 2018-04-08 (×2): qty 1

## 2018-04-08 MED ORDER — HYDRALAZINE HCL 50 MG PO TABS: 50.0000 mg | ORAL_TABLET | Freq: Two times a day (BID) | ORAL | Status: DC

## 2018-04-08 MED ORDER — HYDRALAZINE HCL 50 MG PO TABS: 50.0000 mg | ORAL_TABLET | ORAL | Status: DC

## 2018-04-08 MED ORDER — FUROSEMIDE 40 MG PO TABS
40.0000 mg | ORAL_TABLET | Freq: Every day | ORAL | Status: DC
  Administered 2018-04-08 – 2018-04-09 (×2): 40 mg via ORAL
  Filled 2018-04-08 (×2): qty 1

## 2018-04-08 MED ORDER — CHLORHEXIDINE GLUCONATE CLOTH 2 % EX PADS
6.0000 | MEDICATED_PAD | Freq: Every day | CUTANEOUS | Status: DC
  Administered 2018-04-09: 6 via TOPICAL

## 2018-04-08 MED ORDER — ASPIRIN EC 81 MG PO TBEC
81.0000 mg | DELAYED_RELEASE_TABLET | Freq: Every day | ORAL | Status: DC
  Administered 2018-04-08 – 2018-04-09 (×2): 81 mg via ORAL
  Filled 2018-04-08 (×2): qty 1

## 2018-04-08 MED ORDER — INSULIN ASPART 100 UNIT/ML ~~LOC~~ SOLN
0.0000 [IU] | Freq: Every day | SUBCUTANEOUS | Status: DC
  Administered 2018-04-08: 5 [IU] via SUBCUTANEOUS
  Filled 2018-04-08: qty 1

## 2018-04-08 MED ORDER — LATANOPROST 0.005 % OP SOLN
1.0000 [drp] | Freq: Every day | OPHTHALMIC | Status: DC
  Administered 2018-04-08: 1 [drp] via OPHTHALMIC
  Filled 2018-04-08: qty 2.5

## 2018-04-08 MED ORDER — FLUTICASONE PROPIONATE 50 MCG/ACT NA SUSP
2.0000 | Freq: Every day | NASAL | Status: DC
  Filled 2018-04-08: qty 16

## 2018-04-08 MED ORDER — RENA-VITE PO TABS
1.0000 | ORAL_TABLET | Freq: Every day | ORAL | Status: DC
  Administered 2018-04-09: 1 via ORAL
  Filled 2018-04-08: qty 1

## 2018-04-08 MED ORDER — SEVELAMER CARBONATE 800 MG PO TABS
800.0000 mg | ORAL_TABLET | Freq: Three times a day (TID) | ORAL | Status: DC
  Administered 2018-04-08 – 2018-04-09 (×4): 800 mg via ORAL
  Filled 2018-04-08 (×4): qty 1

## 2018-04-08 MED ORDER — FAMOTIDINE 20 MG PO TABS
20.0000 mg | ORAL_TABLET | Freq: Every day | ORAL | Status: DC
  Administered 2018-04-08: 20 mg via ORAL
  Filled 2018-04-08: qty 1

## 2018-04-08 MED ORDER — PRAVASTATIN SODIUM 20 MG PO TABS
40.0000 mg | ORAL_TABLET | Freq: Every day | ORAL | Status: DC
  Administered 2018-04-08: 40 mg via ORAL
  Filled 2018-04-08: qty 2

## 2018-04-08 MED ORDER — CLONIDINE HCL 0.1 MG PO TABS: 0.1000 mg | ORAL_TABLET | ORAL | Status: DC

## 2018-04-08 MED ORDER — IPRATROPIUM BROMIDE 0.03 % NA SOLN: 1.0000 | Freq: Three times a day (TID) | NASAL | Status: DC

## 2018-04-08 MED ORDER — COLCHICINE 0.6 MG PO TABS: 0.3000 mg | ORAL_TABLET | ORAL | Status: DC

## 2018-04-08 MED ORDER — ENSURE ENLIVE PO LIQD
237.0000 mL | Freq: Two times a day (BID) | ORAL | Status: DC
  Administered 2018-04-08 – 2018-04-09 (×3): 237 mL via ORAL

## 2018-04-08 MED ORDER — INSULIN ASPART 100 UNIT/ML ~~LOC~~ SOLN
0.0000 [IU] | Freq: Three times a day (TID) | SUBCUTANEOUS | Status: DC
  Administered 2018-04-08: 3 [IU] via SUBCUTANEOUS
  Administered 2018-04-09: 2 [IU] via SUBCUTANEOUS
  Administered 2018-04-09: 1 [IU] via SUBCUTANEOUS
  Administered 2018-04-09: 2 [IU] via SUBCUTANEOUS
  Filled 2018-04-08 (×4): qty 1

## 2018-04-08 MED ORDER — MIRTAZAPINE 15 MG PO TABS
15.0000 mg | ORAL_TABLET | Freq: Every day | ORAL | Status: DC
  Administered 2018-04-08: 15 mg via ORAL
  Filled 2018-04-08: qty 1

## 2018-04-08 MED ORDER — ONDANSETRON HCL 4 MG/2ML IJ SOLN: 4.0000 mg | Freq: Four times a day (QID) | INTRAMUSCULAR | Status: DC | PRN

## 2018-04-08 MED ORDER — METOPROLOL SUCCINATE ER 25 MG PO TB24
25.0000 mg | ORAL_TABLET | Freq: Every day | ORAL | Status: DC
  Administered 2018-04-08: 25 mg via ORAL
  Filled 2018-04-08: qty 1

## 2018-04-08 MED ORDER — CINACALCET HCL 30 MG PO TABS
90.0000 mg | ORAL_TABLET | Freq: Every day | ORAL | Status: DC
  Administered 2018-04-08 – 2018-04-09 (×2): 90 mg via ORAL
  Filled 2018-04-08 (×2): qty 3

## 2018-04-08 MED ORDER — CLONIDINE HCL 0.1 MG PO TABS: 0.1000 mg | ORAL_TABLET | Freq: Two times a day (BID) | ORAL | Status: DC

## 2018-04-08 MED ORDER — HEPARIN SODIUM (PORCINE) 5000 UNIT/ML IJ SOLN
5000.0000 [IU] | Freq: Three times a day (TID) | INTRAMUSCULAR | Status: DC
  Administered 2018-04-08 – 2018-04-09 (×4): 5000 [IU] via SUBCUTANEOUS
  Filled 2018-04-08 (×4): qty 1

## 2018-04-08 MED ORDER — BRIMONIDINE TARTRATE 0.2 % OP SOLN
1.0000 [drp] | Freq: Three times a day (TID) | OPHTHALMIC | Status: DC
  Administered 2018-04-08 – 2018-04-09 (×5): 1 [drp] via OPHTHALMIC
  Filled 2018-04-08: qty 5

## 2018-04-08 MED ORDER — PANTOPRAZOLE SODIUM 40 MG PO TBEC
40.0000 mg | DELAYED_RELEASE_TABLET | Freq: Every day | ORAL | Status: DC
  Administered 2018-04-08 – 2018-04-09 (×2): 40 mg via ORAL
  Filled 2018-04-08 (×2): qty 1

## 2018-04-08 MED ORDER — FERROUS SULFATE 325 (65 FE) MG PO TABS
325.0000 mg | ORAL_TABLET | Freq: Every day | ORAL | Status: DC
  Administered 2018-04-09: 325 mg via ORAL
  Filled 2018-04-08: qty 1

## 2018-04-08 MED ORDER — ACETAMINOPHEN 325 MG PO TABS: 650.0000 mg | ORAL_TABLET | Freq: Four times a day (QID) | ORAL | Status: DC | PRN

## 2018-04-08 MED ORDER — ONDANSETRON HCL 4 MG PO TABS: 4.0000 mg | ORAL_TABLET | Freq: Four times a day (QID) | ORAL | Status: DC | PRN

## 2018-04-08 MED ORDER — ISOSORBIDE MONONITRATE ER 30 MG PO TB24
30.0000 mg | ORAL_TABLET | Freq: Two times a day (BID) | ORAL | Status: DC
  Administered 2018-04-08 – 2018-04-09 (×3): 30 mg via ORAL
  Filled 2018-04-08 (×3): qty 1

## 2018-04-08 NOTE — ED Notes (Signed)
Pt assisted to bathroom with two person assist. Was able to pivot but unable to take steps.

## 2018-04-08 NOTE — Progress Notes (Signed)
Family Meeting Note  Advance Directive:no  Today a meeting took place with the Patient.  And her son    The following clinical team members were present during this meeting:MD , ED RN  The following were discussed:Patient's diagnosis: End-stage renal disease on hemodialysis, CAD Hypertension History of CVA, Patient's progosis: Unable to determine and Goals for treatment: Full Code  Additional follow-up to be provided: Chaplain consultation to create advanced directives  Time spent during discussion: 16 minutes  Maricus Tanzi, MD

## 2018-04-08 NOTE — ED Notes (Signed)
Patient transported to CT 

## 2018-04-08 NOTE — Progress Notes (Signed)
eeg completed ° °

## 2018-04-08 NOTE — ED Notes (Signed)
Fall risk bracelet placed on patient. Fall mats placed on floor. Bed alarm placed on patient. Fall risk sign placed on door. Pink sleeve placed on left arm (dialysis fistula).

## 2018-04-08 NOTE — Evaluation (Signed)
Physical Therapy Evaluation Patient Details Name: Laurie Steele MRN: [id number] DOB: Jul 13, 1941 Today's Date: 04/08/2018   History of Present Illness  presented to ER and admitted under observation secondary to tremors, weakness and fall in home environment.  CTH negative for acute change.  Clinical Impression  Upon evaluation, patient alert and oriented; follows commands.  Agreeable to participation with session with min encouragement from therapist.  Baseline R hemiparesis noted (3-/5); patient denies change in R UE/Le strength and control with this admission.  Moderate myoclonic jerking noted in bilat UEs/LEs with active movement, WBing activities (absent at rest); placing patient at high risk for buckling/LOB with standing and mobility tasks.  Currently requiring cga for bed mobility; min/mod assist +2 with RW for sit/stand, forward/backward stepping (x2 steps) and bed/chair transfer.  Generally unsteady and unsafe; recommend continued use of RW and +1-2 assist at all times. Would benefit from skilled PT to address above deficits and promote optimal return to PLOF; recommend transition to STR upon discharge from acute hospitalization.     Follow Up Recommendations SNF    Equipment Recommendations       Recommendations for Other Services       Precautions / Restrictions Precautions Precautions: Fall Precaution Comments: L UE AVF Restrictions Weight Bearing Restrictions: No      Mobility  Bed Mobility Overal bed mobility: Needs Assistance Bed Mobility: Supine to Sit     Supine to sit: Min guard     General bed mobility comments: increased time, use of bedrail to complete  Transfers Overall transfer level: Needs assistance Equipment used: Rolling walker (2 wheeled) Transfers: Sit to/from Stand Sit to Stand: Min assist;Mod assist;+2 physical assistance            Ambulation/Gait Ambulation/Gait assistance: Mod assist;+2 physical assistance Ambulation Distance  (Feet): 2 Feet(2 steps foward/backward x2) Assistive device: Rolling walker (2 wheeled)       General Gait Details: decreased step height/length, mod WBing on RW.  Mod myoclonic jerking with WBing activities; mod assist +2 for balance/safety in standing.  High risk for buckling, LOB  Stairs            Wheelchair Mobility    Modified Rankin (Stroke Patients Only)       Balance Overall balance assessment: Needs assistance Sitting-balance support: No upper extremity supported;Feet supported Sitting balance-Leahy Scale: Good     Standing balance support: Bilateral upper extremity supported Standing balance-Leahy Scale: Poor                               Pertinent Vitals/Pain Pain Assessment: No/denies pain    Home Living Family/patient expects to be discharged to:: Private residence Living Arrangements: Children Available Help at Discharge: Family;Available 24 hours/day Type of Home: House Home Access: Stairs to enter Entrance Stairs-Rails: None Entrance Stairs-Number of Steps: 1 Home Layout: One level Home Equipment: Walker - 2 wheels      Prior Function Level of Independence: Needs assistance         Comments: Sup/mod indep with RW for ADLs, limited household mobility; endorses at least 2 falls within previous six months.  Recent stay at [facility] (discharged home aprox 1 week prior to admission)     Hand Dominance   Dominant Hand: Right    Extremity/Trunk Assessment   Upper Extremity Assessment Upper Extremity Assessment: (R UE grossly 3-5, L UE 4/5; mild dysmetria, ataxia R > L UE.  Mild/mod myoclonic jerking with active  movement/muscle activation)    Lower Extremity Assessment Lower Extremity Assessment: (R LE grossly 3-/5, L 4/5; mod bucking/myoclonic jerking with WBing positions)       Communication   Communication: No difficulties(hypophonic)  Cognition Arousal/Alertness: Awake/alert Behavior During Therapy: WFL for tasks  assessed/performed Overall Cognitive Status: Within Functional Limits for tasks assessed                                        General Comments      Exercises Other Exercises Other Exercises: Sit/stand with RW x3, min/mod assist +2 for safety-emphasis on hand placement, postural extension, bilat hip/knee extension in closed chain to control/minimize jerking   Assessment/Plan    PT Assessment Patient needs continued PT services  PT Problem List Decreased strength;Decreased activity tolerance;Decreased mobility;Decreased safety awareness;Decreased range of motion;Decreased balance;Decreased coordination;Decreased knowledge of use of DME;Decreased knowledge of precautions       PT Treatment Interventions DME instruction;Stair training;Therapeutic activities;Balance training;Cognitive remediation;Gait training;Functional mobility training;Therapeutic exercise;Neuromuscular re-education;Patient/family education    PT Goals (Current goals can be found in the Care Plan section)  Acute Rehab PT Goals Patient Stated Goal: to stop these tremors PT Goal Formulation: With patient Time For Goal Achievement: 04/22/18 Potential to Achieve Goals: Fair    Frequency Min 2X/week   Barriers to discharge        Co-evaluation               AM-PAC PT "6 Clicks" Daily Activity  Outcome Measure Difficulty turning over in bed (including adjusting bedclothes, sheets and blankets)?: A Lot Difficulty moving from lying on back to sitting on the side of the bed? : A Lot Difficulty sitting down on and standing up from a chair with arms (e.g., wheelchair, bedside commode, etc,.)?: Unable Help needed moving to and from a bed to chair (including a wheelchair)?: A Lot Help needed walking in hospital room?: Total Help needed climbing 3-5 steps with a railing? : Total 6 Click Score: 9    End of Session Equipment Utilized During Treatment: Gait belt Activity Tolerance: Patient  tolerated treatment well Patient left: with call bell/phone within reach;with chair alarm set;in chair Nurse Communication: Mobility status PT Visit Diagnosis: Unsteadiness on feet (R26.81);Repeated falls (R29.6);Difficulty in walking, not elsewhere classified (R26.2);Muscle weakness (generalized) (M62.81)    Time: 5631-4970 PT Time Calculation (min) (ACUTE ONLY): 25 min   Charges:   PT Evaluation $PT Eval Moderate Complexity: 1 Mod PT Treatments $Therapeutic Activity: 8-22 mins   PT G Codes:        Willy Vorce H. Owens Shark, PT, DPT, NCS 04/08/18, 10:56 PM [phone]

## 2018-04-08 NOTE — Care Management Obs Status (Signed)
[place] NOTIFICATION   Patient Details  Name: DARIANN HUCKABA MRN: [id number] Date of Birth: 16-Feb-1941   Medicare Observation Status Notification Given:  No(attempted to given obs letter.  Patient resting soundly, and did not arouse when I called her name.  Will reattempt at a later time )    Beverly Sessions, RN 04/08/2018, 4:13 PM

## 2018-04-08 NOTE — H&P (Signed)
[facility] NAME: Laurie Steele    MR#:  [id number]  DATE OF BIRTH:  1941/02/16  DATE OF ADMISSION:  04/08/2018  PRIMARY CARE PHYSICIAN: [name], MD   REQUESTING/REFERRING PHYSICIAN: Dr. Owens Shark  CHIEF COMPLAINT:   Fall HISTORY OF PRESENT ILLNESS:  Laurie Steele  is a 77 y.o. female with a known history of CVA with right residual weakness, end-stage renal disease on hemodialysis and CAD who presents today to the emergency room after a fall.  Patient reports that she was feeling very tremulous and took a mechanical fall.  She is not complaining of any hip pain.  She did not hit her head.  She has had tremors for the past 2 days.  She does not have tremors at baseline.  Son who is at bedside is concerned about her tremors.  She has not taken any new medications.  While in dialysis yesterday she was also tremulous.  Her blood sugars have been normal.  PAST MEDICAL HISTORY:   Past Medical History:  Diagnosis Date  . Anxiety   . CAD (coronary artery disease)   . Chronic anemia    Dr Inez Pilgrim  . Dysrhythmia   . GERD (gastroesophageal reflux disease)   . Glaucoma   . Gout   . Gout   . Hx of colonic polyps   . Hyperlipidemia   . Hypertension   . Lower back pain   . Miscarriage   . Myocardial infarction ([place])   . 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 Neuropathy   . 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 Peritoneal dialysis status ([place])   . Renal failure    Dr Holley Raring  . Renal insufficiency   . 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 Vaginal delivery    x 4    PAST SURGICAL HISTORY:   Past Surgical History:  Procedure Laterality Date  . ABDOMINAL HYSTERECTOMY  1983  . AV FISTULA PLACEMENT Left 09/21/2016   Procedure: ARTERIOVENOUS (AV) FISTULA CREATION ( BRACHIOCEPHALIC );  Surgeon: Katha Cabal, MD;  Location:  [facility] ORS;  Service: Vascular;  Laterality: Left;  . BACK SURGERY  6/08   Dr Collier Salina  . BACK SURGERY  1987   disc  . CHOLECYSTECTOMY    . DIALYSIS/PERMA CATHETER REMOVAL N/A 02/25/2017   Procedure: Dialysis/Perma Catheter Removal;  Surgeon: Algernon Huxley, MD;  Location: [facility] CV LAB;  Service: Cardiovascular;  Laterality: N/A;  . INSERTION OF DIALYSIS CATHETER  2017  . PERITONEAL CATHETER INSERTION    . REMOVAL OF A DIALYSIS CATHETER N/A 09/21/2016   Procedure: REMOVAL OF A DIALYSIS CATHETER ( PERITONEAL DIALYSIS CATH );  Surgeon: Katha Cabal, MD;  Location: [facility] ORS;  Service: Vascular;  Laterality: N/A;  . VITRECTOMY AND CATARACT Left 11/04/2017   Procedure: VITRECTOMY AND CATARACT;  Surgeon: Leandrew Koyanagi, MD;  Location: [facility] ORS;  Service: Ophthalmology;  Laterality: Left;  US00:51.6 AP%21.8 PPI95.18 FLUID LOT #[id number]    SOCIAL HISTORY:   Social History   Tobacco Use  . Smoking status: Former Smoker    Last attempt to quit: 11/05/1990    Years since quitting: 27.4  . Smokeless tobacco: Never Used  Substance Use Topics  . Alcohol use: No    FAMILY HISTORY:   Family History  Problem Relation Age of Onset  .  Heart attack Father   . Hypertension Mother   . Hyperlipidemia Mother   . Coronary artery disease Unknown        strong fam hx  . Kidney failure Brother   . Breast cancer Neg Hx     DRUG ALLERGIES:   Allergies  Allergen Reactions  . Alprazolam Other (See Comments)    Unable to sleep and confusion    REVIEW OF SYSTEMS:   Review of Systems  Constitutional: Positive for malaise/fatigue. Negative for chills and fever.  HENT: Negative.  Negative for ear discharge, ear pain, hearing loss, nosebleeds and sore throat.   Eyes: Negative.  Negative for blurred vision and pain.  Respiratory: Negative.  Negative for cough, hemoptysis, shortness of breath and wheezing.   Cardiovascular: Negative.  Negative for chest pain, palpitations and leg  swelling.  Gastrointestinal: Negative.  Negative for abdominal pain, blood in stool, diarrhea, nausea and vomiting.  Genitourinary: Negative.  Negative for dysuria.  Musculoskeletal: Negative.  Negative for back pain.  Skin: Negative.   Neurological: Positive for tremors and focal weakness (chronic right-sided weakness from previous stroke). Negative for dizziness, speech change, seizures and headaches.  Endo/Heme/Allergies: Negative.  Does not bruise/bleed easily.  Psychiatric/Behavioral: Negative.  Negative for depression, hallucinations and suicidal ideas.    MEDICATIONS AT HOME:   Prior to Admission medications   Medication Sig Start Date End Date Taking? Authorizing Provider  acetaminophen (TYLENOL) 650 MG CR tablet Take 650 mg by mouth every 8 (eight) hours as needed for pain.    [provider]  allopurinol (ZYLOPRIM) 100 MG tablet Take 200 mg by mouth at bedtime.    [provider]  amLODipine (NORVASC) 10 MG tablet Take 10 mg by mouth daily. 11/03/16   [provider]  aspirin EC 81 MG tablet Take 1 tablet (81 mg total) by mouth daily. 06/18/16   Fritzi Mandes, MD  brimonidine (ALPHAGAN) 0.2 % ophthalmic solution Place 1 drop into the left eye 3 (three) times daily. 02/03/18   [provider]  cinacalcet (SENSIPAR) 90 MG tablet Take 90 mg by mouth daily. With lunch    [provider]  cloNIDine (CATAPRES) 0.1 MG tablet Take 0.1 mg by mouth 2 (two) times daily. TAKE 0.1MG  BY MOUTH ON Sunday,TUESDAY,THURSDAY, AND SATURDAY 11/08/16   [provider]  clopidogrel (PLAVIX) 75 MG tablet Take 1 tablet (75 mg total) by mouth daily. 01/26/17   Nicholes Mango, MD  colchicine 0.6 MG tablet Take 0.5 tablets (0.3 mg total) by mouth 2 (two) times a week. 07/09/16   Demetrios Loll, MD  docusate sodium (COLACE) 100 MG capsule Take 1 capsule (100 mg total) by mouth daily as needed for mild constipation. 12/13/16   Loletha Grayer, MD  famotidine (PEPCID) 20 MG  tablet Take 20 mg by mouth at bedtime.  11/29/16   [provider]  feeding supplement, ENSURE ENLIVE, (ENSURE ENLIVE) LIQD Take 237 mLs by mouth 2 (two) times daily between meals. 01/25/17   Max Sane, MD  ferrous sulfate 325 (65 FE) MG tablet Take 1 tablet (325 mg total) by mouth daily with breakfast. 06/18/16   Fritzi Mandes, MD  fluticasone [facility]) 50 MCG/ACT nasal spray Place 2 sprays into both nostrils daily.     [provider]  furosemide (LASIX) 40 MG tablet Take 1 tablet (40 mg total) by mouth daily. 07/05/16   Demetrios Loll, MD  glimepiride (AMARYL) 1 MG tablet Take 1 mg by mouth daily with breakfast.  [provider]  hydrALAZINE (APRESOLINE) 50 MG tablet Take 50 mg by mouth 2 (two) times daily. ONLY ON Tuesday,THURSDAY,SATURDAY ,AND SUNDAY 11/29/16   [provider]  insulin aspart (NOVOLOG) 100 UNIT/ML injection Inject 0-5 Units into the skin at bedtime. CBG < 70: implement hypoglycemia protocol  CBG 70 - 120: 0 units  CBG 121 - 150: 2 units  CBG 151 - 200: 3 units  CBG 201 - 250: 5 units  CBG 251 - 300: 8 units  CBG 301 - 350: 11 units  CBG 351 - 400: 15 units 03/04/18   Griselda Tosh, MD  ipratropium (ATROVENT) 0.03 % nasal spray Place 1 spray into both nostrils 3 (three) times daily.     [provider]  ipratropium-albuterol (DUONEB) 0.5-2.5 (3) MG/3ML SOLN Take 3 mLs by nebulization every 6 (six) hours as needed. 12/14/16   Gladstone Lighter, MD  isosorbide mononitrate (IMDUR) 30 MG 24 hr tablet Take 30 mg by mouth 2 (two) times daily. 11/03/16   [provider]  latanoprost (XALATAN) 0.005 % ophthalmic solution Place 1 drop into both eyes at bedtime. 03/20/16   [provider]  metoprolol succinate (TOPROL-XL) 25 MG 24 hr tablet Take 1 tablet (25 mg total) by mouth at bedtime. 12/13/16   Loletha Grayer, MD  mirtazapine (REMERON) 15 MG tablet Take 15 mg by mouth at bedtime. 02/03/18   [provider]  multivitamin  (RENA-VIT) TABS tablet Take 1 tablet by mouth daily.    [provider]  pantoprazole (PROTONIX) 40 MG tablet Take 1 tablet (40 mg total) by mouth daily. 06/18/16   Fritzi Mandes, MD  pravastatin (PRAVACHOL) 40 MG tablet Take 40 mg by mouth at bedtime.  06/21/15   [provider]  sevelamer (RENAGEL) 800 MG tablet Take 800 mg by mouth 3 (three) times daily with meals.    [provider]      VITAL SIGNS:  Blood pressure 116/75, pulse 62, temperature 97.8 F (36.6 C), temperature source Oral, resp. rate 17, weight 77.1 kg (170 lb), SpO2 100 %.  PHYSICAL EXAMINATION:   Physical Exam  Constitutional: She is oriented to person, place, and time. No distress.  HENT:  Head: Normocephalic.  Eyes: No scleral icterus.  Neck: Normal range of motion. Neck supple. No JVD present. No tracheal deviation present.  Cardiovascular: Normal rate, regular rhythm and normal heart sounds. Exam reveals no gallop and no friction rub.  No murmur heard. Pulmonary/Chest: Effort normal and breath sounds normal. No respiratory distress. She has no wheezes. She has no rales. She exhibits no tenderness.  Abdominal: Soft. Bowel sounds are normal. She exhibits no distension and no mass. There is no tenderness. There is no rebound and no guarding.  Musculoskeletal: She exhibits no edema.  Chronic right-sided weakness from previous stroke.  She is tremulous.  Neurological: She is alert and oriented to person, place, and time.  Skin: Skin is warm. No rash noted. No erythema.  Psychiatric: She has a normal mood and affect. Her behavior is normal. Judgment normal.      LABORATORY PANEL:   CBC Recent Labs  Lab 04/08/18 0618  WBC 7.7  HGB 12.7  HCT 37.1  PLT 174   ------------------------------------------------------------------------------------------------------------------  Chemistries  Recent Labs  Lab 04/08/18 0618  NA 142  K 3.6  CL 99*  CO2 28  GLUCOSE 131*  BUN 48*   CREATININE 5.94*  CALCIUM 8.7*  AST 33  ALT 31  ALKPHOS 173*  BILITOT 0.7   ------------------------------------------------------------------------------------------------------------------  Cardiac Enzymes No results for input(s): TROPONINI in the last 168 hours. ------------------------------------------------------------------------------------------------------------------  RADIOLOGY:  Ct Head Wo Contrast  Result Date: 04/08/2018 CLINICAL DATA:  [place] at home. Persistent tremor since dialysis. History of hypertension, hyperlipidemia. EXAM: CT HEAD WITHOUT CONTRAST TECHNIQUE: Contiguous axial images were obtained from the base of the skull through the vertex without intravenous contrast. COMPARISON:  MRI head February 28, 2018 FINDINGS: BRAIN: No intraparenchymal hemorrhage, mass effect nor midline shift. Moderate parenchymal brain volume loss. No hydrocephalus. Patchy supratentorial white matter hypodensities within normal range for patient's age, though non-specific are most compatible with chronic small vessel ischemic disease. No acute large vascular territory infarcts. No abnormal extra-axial fluid collections. Basal cisterns are patent. VASCULAR: Severe calcific atherosclerosis of the carotid siphons. SKULL: No skull fracture. No significant scalp soft tissue swelling. SINUSES/ORBITS: The mastoid air-cells and included paranasal sinuses are well-aerated.The included ocular globes and orbital contents are non-suspicious. Status post left ocular lens implant. OTHER: None. IMPRESSION: 1. No acute intracranial process. 2. Stable moderate parenchymal brain volume loss and mild chronic small vessel ischemic changes. 3. Severe atherosclerosis. Electronically Signed   By: Elon Alas M.D.   On: 04/08/2018 06:41    EKG:  Simona Huh rhythm with PVCs no ST elevation or depression  IMPRESSION AND PLAN:   77 year old female with end-stage renal disease on hemodialysis, history of CVA with right  residual weakness and diabetes who presents due to mechanical fall with tremors.  1.  Tremors:Patient does not drink alcohol or has not taken any new medications. Order magnesium, phosphorus level, B12 and TSH Neurology consultation requested for further evaluation Patient does not drink alcohol or has not taken any new medications.   2.  End-stage renal disease on hemodialysis: Nephrology consultation requested  3.  Mechanical fall: Physical therapy consultation requested  4.  Diabetes: Continue sliding scale, ADA diet   5.  Essential hypertension: Continue isosorbide, metoprolol, hydralazine, clonidine, Norvasc  6.  History of CVA with right residual weakness: Continue Plavix, aspirin and statin  7.  CAD: Continue metoprolol, Plavix, aspirin and statin All the records are reviewed and case discussed with ED provider. Management plans discussed with the patient and son and they are in agreement  CODE STATUS: Full  TOTAL TIME TAKING CARE OF THIS PATIENT: 39 minutes.    Olaoluwa Grieder M.D on 04/08/2018 at 8:49 AM  Between 7am to 6pm - Pager - [phone]  After 6pm go to [email or web address] - password [email or web address]  Office  [phone]  CC: Primary care physician; [name], MD

## 2018-04-08 NOTE — ED Notes (Signed)
Pt VSS. NAD. Family with pt upon going to the floor.

## 2018-04-08 NOTE — Progress Notes (Signed)
Patient declined information on 04/08/2018 at 10:41.  Chaplain met with patient and she is still not interested. Chaplain left AD information and asked patient to contact the on-call chaplain if she wanted to proceed.

## 2018-04-08 NOTE — Consult Note (Signed)
Reason for Consult:Tremor Referring Physician: Mody  CC: Tremor  HPI: Laurie Steele is an 77 y.o. female with a history of ESRD who was admitted on yesterday after a fall.  Patient reports that over the past two days has had development of tremor.  Tremor is most apparent with standing or trying to walk.  With recent fall was attempting to rise from the toilet but could not control walker due to tremor.   Has not had recent medication changes.  Has not missed any dialysis appointments. Uses a walker at baseline due to poor balance that has been an issue for some time.    Past Medical History:  Diagnosis Date  . Anxiety   . CAD (coronary artery disease)   . Chronic anemia    Dr Inez Pilgrim  . Dysrhythmia   . GERD (gastroesophageal reflux disease)   . Glaucoma   . Gout   . Gout   . Hx of colonic polyps   . Hyperlipidemia   . Hypertension   . Lower back pain   . Miscarriage   . Myocardial infarction ([place])   . 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 Neuropathy   . 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 Peritoneal dialysis status ([place])   . Renal failure    Dr Holley Raring  . Renal insufficiency   . 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 Vaginal delivery    x 4    Past Surgical History:  Procedure Laterality Date  . ABDOMINAL HYSTERECTOMY  1983  . AV FISTULA PLACEMENT Left 09/21/2016   Procedure: ARTERIOVENOUS (AV) FISTULA CREATION ( BRACHIOCEPHALIC );  Surgeon: Katha Cabal, MD;  Location: [facility] ORS;  Service: Vascular;  Laterality: Left;  . BACK SURGERY  6/08   Dr Collier Salina  . BACK SURGERY  1987   disc  . CHOLECYSTECTOMY    . DIALYSIS/PERMA CATHETER REMOVAL N/A 02/25/2017   Procedure: Dialysis/Perma Catheter Removal;  Surgeon: Algernon Huxley, MD;  Location: [place] [facility];  Service: Cardiovascular;  Laterality: N/A;  . INSERTION OF DIALYSIS CATHETER  2017  . PERITONEAL  CATHETER INSERTION    . REMOVAL OF A DIALYSIS CATHETER N/A 09/21/2016   Procedure: REMOVAL OF A DIALYSIS CATHETER ( PERITONEAL DIALYSIS CATH );  Surgeon: Katha Cabal, MD;  Location: [facility];  Service: Vascular;  Laterality: N/A;  . VITRECTOMY AND CATARACT Left 11/04/2017   Procedure: VITRECTOMY AND CATARACT;  Surgeon: Leandrew Koyanagi, MD;  Location: [facility];  Service: Ophthalmology;  Laterality: Left;  US00:51.6 AP%21.8 BHA19.37 FLUID LOT #[id number] H    Family History  Problem Relation Age of Onset  . Heart attack Father   . Hypertension Mother   . Hyperlipidemia Mother   . Coronary artery disease Unknown        strong fam hx  . Kidney failure Brother   . Breast cancer Neg Hx     Social History:  reports that she quit smoking about 27 years ago. She has never used smokeless tobacco. She reports that she does not drink alcohol or use drugs.  Allergies  Allergen Reactions  . Alprazolam Other (See Comments)    Unable to sleep and confusion    Medications:  I have reviewed the patient's current medications. Prior to Admission:  Medications Prior to Admission  Medication Sig Dispense Refill Last Dose  . acetaminophen (TYLENOL) 650 MG  CR tablet Take 650 mg by mouth every 8 (eight) hours as needed for pain.   PRN at PRN  . allopurinol (ZYLOPRIM) 100 MG tablet Take 200 mg by mouth at bedtime.   04/07/2018 at 2100  . amLODipine (NORVASC) 10 MG tablet Take 10 mg by mouth daily.  11 04/07/2018 at Unknown time  . aspirin EC 81 MG tablet Take 1 tablet (81 mg total) by mouth daily. 30 tablet 1 04/07/2018 at Unknown time  . brimonidine (ALPHAGAN) 0.2 % ophthalmic solution Place 1 drop into the left eye 3 (three) times daily.  5 04/07/2018 at 2000  . cinacalcet (SENSIPAR) 90 MG tablet Take 90 mg by mouth daily. With lunch   04/07/2018 at 1200  . cloNIDine (CATAPRES) 0.1 MG tablet Take 0.1 mg by mouth 2 (two) times daily. TAKE 0.1MG  BY MOUTH ON Sunday,TUESDAY,THURSDAY, AND SATURDAY   Taking   . clopidogrel (PLAVIX) 75 MG tablet Take 1 tablet (75 mg total) by mouth daily. 30 tablet 0 04/07/2018 at Unknown time  . colchicine 0.6 MG tablet Take 0.5 tablets (0.3 mg total) by mouth 2 (two) times a week. 4 tablet 2 As directed at As directed  . docusate sodium (COLACE) 100 MG capsule Take 1 capsule (100 mg total) by mouth daily as needed for mild constipation. 30 capsule 0 PRN at PRN  . famotidine (PEPCID) 20 MG tablet Take 20 mg by mouth at bedtime.    04/07/2018 at 2100  . feeding supplement, ENSURE ENLIVE, (ENSURE ENLIVE) LIQD Take 237 mLs by mouth 2 (two) times daily between meals. 237 mL 12 Taking  . ferrous sulfate 325 (65 FE) MG tablet Take 1 tablet (325 mg total) by mouth daily with breakfast. 30 tablet 3 04/07/2018 at 0800  . fluticasone (FLONASE) 50 MCG/ACT nasal spray Place 2 sprays into both nostrils daily.    Taking  . furosemide (LASIX) 40 MG tablet Take 1 tablet (40 mg total) by mouth daily. 30 tablet 2 04/07/2018 at Unknown time  . glimepiride (AMARYL) 1 MG tablet Take 1 mg by mouth daily with breakfast.   04/07/2018 at 0800  . hydrALAZINE (APRESOLINE) 50 MG tablet Take 50 mg by mouth 2 (two) times daily. ONLY ON Tuesday,THURSDAY,SATURDAY ,AND SUNDAY   Taking  . insulin aspart (NOVOLOG) 100 UNIT/ML injection Inject 0-5 Units into the skin at bedtime. CBG < 70: implement hypoglycemia protocol  CBG 70 - 120: 0 units  CBG 121 - 150: 2 units  CBG 151 - 200: 3 units  CBG 201 - 250: 5 units  CBG 251 - 300: 8 units  CBG 301 - 350: 11 units  CBG 351 - 400: 15 units 10 mL 0 As directed at As directed  . ipratropium (ATROVENT) 0.03 % nasal spray Place 1 spray into both nostrils 3 (three) times daily.    Taking  . ipratropium-albuterol (DUONEB) 0.5-2.5 (3) MG/3ML SOLN Take 3 mLs by nebulization every 6 (six) hours as needed. 360 mL 0 PRN at PRN  . isosorbide mononitrate (IMDUR) 30 MG 24 hr tablet Take 30 mg by mouth 2 (two) times daily.  3 04/07/2018 at 2100  . latanoprost (XALATAN) 0.005 %  ophthalmic solution Place 1 drop into both eyes at bedtime.  6 04/07/2018 at 2100  . metoprolol succinate (TOPROL-XL) 25 MG 24 hr tablet Take 1 tablet (25 mg total) by mouth at bedtime. 30 tablet 0 04/07/2018 at 2100  . mirtazapine (REMERON) 15 MG tablet Take 15 mg by mouth at bedtime.  0 04/07/2018 at 2100  . multivitamin (RENA-VIT) TABS tablet Take 1 tablet by mouth daily.   04/07/2018 at Unknown time  . pantoprazole (PROTONIX) 40 MG tablet Take 1 tablet (40 mg total) by mouth daily. 30 tablet 1 04/07/2018 at Unknown time  . pravastatin (PRAVACHOL) 40 MG tablet Take 40 mg by mouth at bedtime.    04/07/2018 at 2100  . sevelamer (RENAGEL) 800 MG tablet Take 800 mg by mouth 3 (three) times daily with meals.   04/07/2018 at 1900   Scheduled: . allopurinol  200 mg Oral QHS  . amLODipine  10 mg Oral Daily  . aspirin EC  81 mg Oral Daily  . brimonidine  1 drop Left Eye TID  . cinacalcet  90 mg Oral Q lunch  . cloNIDine  0.1 mg Oral Q T,Th,S,Su   And  . cloNIDine  0.1 mg Oral Q T,Th,S,Su-1800  . clopidogrel  75 mg Oral Daily  . [START ON 04/10/2018] colchicine  0.3 mg Oral Once per day on Mon Thu  . famotidine  20 mg Oral QHS  . feeding supplement (ENSURE ENLIVE)  237 mL Oral BID BM  . [START ON 04/09/2018] ferrous sulfate  325 mg Oral Q breakfast  . fluticasone  2 spray Each Nare Daily  . furosemide  40 mg Oral Daily  . heparin  5,000 Units Subcutaneous Q8H  . hydrALAZINE  50 mg Oral Q T,Th,S,Su   And  . hydrALAZINE  50 mg Oral Q T,Th,S,Su-1800  . insulin aspart  0-5 Units Subcutaneous QHS  . insulin aspart  0-9 Units Subcutaneous TID WC  . isosorbide mononitrate  30 mg Oral BID  . latanoprost  1 drop Both Eyes QHS  . metoprolol succinate  25 mg Oral QHS  . mirtazapine  15 mg Oral QHS  . multivitamin  1 tablet Oral Daily  . pantoprazole  40 mg Oral Daily  . pravastatin  40 mg Oral QHS  . sevelamer carbonate  800 mg Oral TID WC    ROS: History obtained from the patient  General ROS: negative for -  chills, fatigue, fever, night sweats, weight gain or weight loss Psychological ROS: negative for - behavioral disorder, hallucinations, memory difficulties, mood swings or suicidal ideation Ophthalmic ROS: negative for - blurry vision, double vision, eye pain or loss of vision ENT ROS: negative for - epistaxis, nasal discharge, oral lesions, sore throat, tinnitus or vertigo Allergy and Immunology ROS: negative for - hives or itchy/watery eyes Hematological and Lymphatic ROS: negative for - bleeding problems, bruising or swollen lymph nodes Endocrine ROS: negative for - galactorrhea, hair pattern changes, polydipsia/polyuria or temperature intolerance Respiratory ROS: negative for - cough, hemoptysis, shortness of breath or wheezing Cardiovascular ROS: negative for - chest pain, dyspnea on exertion, edema or irregular heartbeat Gastrointestinal ROS: negative for - abdominal pain, diarrhea, hematemesis, nausea/vomiting or stool incontinence Genito-Urinary ROS: negative for - dysuria, hematuria, incontinence or urinary frequency/urgency Musculoskeletal ROS: negative for - joint swelling or muscular weakness Neurological ROS: as noted in HPI Dermatological ROS: negative for rash and skin lesion changes  Physical Examination: Blood pressure (!) 130/53, pulse 69, temperature 98 F (36.7 C), temperature source Oral, resp. rate 17, weight 77.1 kg (170 lb), SpO2 100 %.  HEENT-  Normocephalic, no lesions, without obvious abnormality.  Normal external eye and conjunctiva.  Normal TM's bilaterally.  Normal auditory canals and external ears. Normal external nose, mucus membranes and septum.  Normal pharynx. Cardiovascular- S1, S2 normal, pulses palpable throughout  Lungs- chest clear, no wheezing, rales, normal symmetric air entry Abdomen- soft, non-tender; bowel sounds normal; no masses,  no organomegaly Extremities- no edema Lymph-no adenopathy palpable Musculoskeletal-no joint tenderness, deformity  or swelling Skin-warm and dry, no hyperpigmentation, vitiligo, or suspicious lesions  Neurological Examination   Mental Status: Alert, oriented, thought content appropriate.  Speech fluent without evidence of aphasia.  Able to follow 3 step commands without difficulty. Cranial Nerves: II: Discs flat bilaterally; Visual fields grossly normal, pupils equal, round, reactive to light and accommodation III,IV, VI: ptosis not present, extra-ocular motions intact bilaterally V,VII: smile symmetric, facial light touch sensation normal bilaterally VIII: hearing normal bilaterally IX,X: gag reflex present XI: bilateral shoulder shrug XII: midline tongue extension Motor: Right : Upper extremity   5/5    Left:     Upper extremity   5/5  Lower extremity   4-/5     Lower extremity   4-/5 Tone and bulk:normal tone throughout; no atrophy noted.  Minimal upper extremity tremor.   Sensory: Pinprick and light touch intact throughout, bilaterally Deep Tendon Reflexes: Trace in the upper extremities and absent in the lower extremities Plantars: Right: mute   Left: mute Cerebellar: Dysmetria with finger-to-nose testing on the right upper extremity.  Heel-to-shin testing intact bilaterally Gait: not tested due to safety concerns    Laboratory Studies:   Basic Metabolic Panel: Recent Labs  Lab 04/08/18 0618  NA 142  K 3.6  CL 99*  CO2 28  GLUCOSE 131*  BUN 48*  CREATININE 5.94*  CALCIUM 8.7*  MG 2.1  PHOS 5.5*    Liver Function Tests: Recent Labs  Lab 04/08/18 0618  AST 33  ALT 31  ALKPHOS 173*  BILITOT 0.7  PROT 7.8  ALBUMIN 3.6   No results for input(s): LIPASE, AMYLASE in the last 168 hours. No results for input(s): AMMONIA in the last 168 hours.  CBC: Recent Labs  Lab 04/08/18 0618  WBC 7.7  NEUTROABS 4.5  HGB 12.7  HCT 37.1  MCV 110.8*  PLT 174    Cardiac Enzymes: No results for input(s): CKTOTAL, CKMB, CKMBINDEX, TROPONINI in the last 168 hours.  BNP: Invalid  input(s): POCBNP  CBG: Recent Labs  Lab 04/08/18 1147  GLUCAP 234*    Microbiology: Results for orders placed or performed during the hospital encounter of 04/08/18  MRSA PCR Screening     Status: None   Collection Time: 04/08/18 11:14 AM  Result Value Ref Range Status   MRSA by PCR NEGATIVE NEGATIVE Final    Comment:        The GeneXpert MRSA Assay (FDA approved for NASAL specimens only), is one component of a comprehensive MRSA colonization surveillance program. It is not intended to diagnose MRSA infection nor to guide or monitor treatment for MRSA infections. Performed at [facility], [address] [place], [place] [address]     Coagulation Studies: No results for input(s): LABPROT, INR in the last 72 hours.  Urinalysis: No results for input(s): COLORURINE, LABSPEC, PHURINE, GLUCOSEU, HGBUR, BILIRUBINUR, KETONESUR, PROTEINUR, UROBILINOGEN, NITRITE, LEUKOCYTESUR in the last 168 hours.  Invalid input(s): APPERANCEUR  Lipid Panel:     Component Value Date/Time   CHOL 156 01/25/2017 0420   CHOL 164 11/29/2012 0251   TRIG 155 (H) 01/25/2017 0420   TRIG 293 (H) 11/29/2012 0251   HDL 37 (L) 01/25/2017 0420   HDL 37 (L) 11/29/2012 0251   CHOLHDL 4.2 01/25/2017 0420   VLDL 31 01/25/2017 0420   VLDL  59 (H) 11/29/2012 0251   LDLCALC 88 01/25/2017 0420   LDLCALC 68 11/29/2012 0251    HgbA1C:  Lab Results  Component Value Date   HGBA1C 6.6 (H) 01/24/2017    Urine Drug Screen:      Component Value Date/Time   LABOPIA NONE DETECTED 02/28/2018 1259   COCAINSCRNUR NONE DETECTED 02/28/2018 1259   LABBENZ NONE DETECTED 02/28/2018 1259   AMPHETMU NONE DETECTED 02/28/2018 1259   THCU NONE DETECTED 02/28/2018 1259   LABBARB NONE DETECTED 02/28/2018 1259    Alcohol Level: No results for input(s): ETH in the last 168 hours.  Other results: EKG: sinus rhythm at 63 bpm.  Imaging: Ct Head Wo Contrast  Result Date: 04/08/2018 CLINICAL DATA:  [place] at  home. Persistent tremor since dialysis. History of hypertension, hyperlipidemia. EXAM: CT HEAD WITHOUT CONTRAST TECHNIQUE: Contiguous axial images were obtained from the base of the skull through the vertex without intravenous contrast. COMPARISON:  MRI head February 28, 2018 FINDINGS: BRAIN: No intraparenchymal hemorrhage, mass effect nor midline shift. Moderate parenchymal brain volume loss. No hydrocephalus. Patchy supratentorial white matter hypodensities within normal range for patient's age, though non-specific are most compatible with chronic small vessel ischemic disease. No acute large vascular territory infarcts. No abnormal extra-axial fluid collections. Basal cisterns are patent. VASCULAR: Severe calcific atherosclerosis of the carotid siphons. SKULL: No skull fracture. No significant scalp soft tissue swelling. SINUSES/ORBITS: The mastoid air-cells and included paranasal sinuses are well-aerated.The included ocular globes and orbital contents are non-suspicious. Status post left ocular lens implant. OTHER: None. IMPRESSION: 1. No acute intracranial process. 2. Stable moderate parenchymal brain volume loss and mild chronic small vessel ischemic changes. 3. Severe atherosclerosis. Electronically Signed   By: Elon Alas M.D.   On: 04/08/2018 06:41     Assessment/Plan: 77 year old female with new onset tremor.  Patient reports that fall was caused by the tremor.  Patient with falls in April as well.  Those were not felt to be secondary to tremor.  Head CT reviewed and shows no acute changes.  Some mild focality on examination.  TSH normal.  B12 pending.  Patient does appear to have some dehydration as well that may be contributing Further work up recommended.    Recommendations: 1. EEG 2. MRI of the brain without contrast 3. Orthostatics   Alexis Goodell, MD Neurology [phone] 04/08/2018, 1:04 PM

## 2018-04-08 NOTE — ED Notes (Signed)
Attempted to ambulate pt with walker. She was able to bear weight, however was not able to ambulate due to tremors. Dr. Lisbeth Renshaw at bedside.

## 2018-04-08 NOTE — ED Provider Notes (Signed)
[facility] Emergency Department Provider Note    First MD Initiated Contact with Patient 04/08/18 [phone]     (approximate)  I have reviewed the triage vital signs and the nursing notes.   HISTORY  Chief Complaint Fall and Tremors   HPI Laurie Steele is a 77 y.o. female with below list of chronic medical conditions including end-stage renal disease CAD with previous MI, and CVA presents to the emergency department with generalized tremors which began yesterday before dialysis.  Patient states tonight while in route to the bathroom tremors "caused her to fall".  Patient denies any headache.  She does admit to her baseline weakness in the right lower extremity.  Patient denies any chest pain or dizziness.   Past Medical History:  Diagnosis Date  . Anxiety   . CAD (coronary artery disease)   . Chronic anemia    Dr Inez Pilgrim  . Dysrhythmia   . GERD (gastroesophageal reflux disease)   . Glaucoma   . Gout   . Gout   . Hx of colonic polyps   . Hyperlipidemia   . Hypertension   . Lower back pain   . Miscarriage   . Myocardial infarction ([place])   . 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 Neuropathy   . 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 Peritoneal dialysis status ([place])   . Renal failure    Dr Holley Raring  . Renal insufficiency   . 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 Vaginal delivery    x 4    Patient Active Problem List   Diagnosis Date Noted  . Tremors of nervous system 04/08/2018  . Altered mental status 02/28/2018  . Complication of vascular access for dialysis 07/18/2017  . UTI (urinary tract infection) 01/22/2017  . CHF (congestive heart failure) ([place]) 12/11/2016  . Ds DNA antibody positive 07/09/2016  . Pneumonia 07/03/2016  . Anemia 06/17/2016  . Symptomatic anemia 06/15/2016  . Hypoglycemia 05/21/2016  . Chest pain 04/05/2016  .  Hypercalcemia 03/14/2016  . Bronchitis 03/03/2012  . Type II or unspecified type diabetes mellitus with renal manifestations, not stated as uncontrolled(250.40) 12/18/2011  . END STAGE RENAL DISEASE 10/23/2010  . DM (diabetes mellitus), type 2 ([place]) 12/13/2009  . NEUROPATHY 12/13/2009  . GOUT 02/15/2009  . SLEEP DISORDER 02/15/2009  . GERD 02/23/2008  . Hyperlipidemia 05/14/2007  . ANEMIA, CHRONIC 05/14/2007  . Essential hypertension 05/14/2007    Past Surgical History:  Procedure Laterality Date  . ABDOMINAL HYSTERECTOMY  1983  . AV FISTULA PLACEMENT Left 09/21/2016   Procedure: ARTERIOVENOUS (AV) FISTULA CREATION ( BRACHIOCEPHALIC );  Surgeon: Katha Cabal, MD;  Location: [facility] ORS;  Service: Vascular;  Laterality: Left;  . BACK SURGERY  6/08   Dr Collier [place]  . BACK SURGERY  1987   disc  . CHOLECYSTECTOMY    . DIALYSIS/PERMA CATHETER REMOVAL N/A 02/25/2017   Procedure: Dialysis/Perma Catheter Removal;  Surgeon: Algernon Huxley, MD;  Location: [facility];  Service: Cardiovascular;  Laterality: N/A;  . INSERTION OF DIALYSIS CATHETER  2017  . PERITONEAL CATHETER INSERTION    . REMOVAL OF A DIALYSIS CATHETER N/A 09/21/2016   Procedure: REMOVAL OF A DIALYSIS CATHETER ( PERITONEAL DIALYSIS CATH );  Surgeon: Katha Cabal, MD;  Location: [facility] ORS;  Service: Vascular;  Laterality: N/A;  . VITRECTOMY AND CATARACT Left  11/04/2017   Procedure: VITRECTOMY AND CATARACT;  Surgeon: Leandrew Koyanagi, MD;  Location: [facility] ORS;  Service: Ophthalmology;  Laterality: Left;  US00:51.6 AP%21.8 YWV37.10 FLUID LOT #[id number]    Prior to Admission medications   Medication Sig Start Date End Date Taking? Authorizing Provider  acetaminophen (TYLENOL) 650 MG CR tablet Take 650 mg by mouth every 8 (eight) hours as needed for pain.   Yes [provider]  allopurinol (ZYLOPRIM) 100 MG tablet Take 200 mg by mouth at bedtime.   Yes [provider]  amLODipine (NORVASC) 10  MG tablet Take 10 mg by mouth daily. 11/03/16  Yes [provider]  aspirin EC 81 MG tablet Take 1 tablet (81 mg total) by mouth daily. 06/18/16  Yes Fritzi Mandes, MD  brimonidine (ALPHAGAN) 0.2 % ophthalmic solution Place 1 drop into the left eye 3 (three) times daily. 02/03/18  Yes [provider]  cinacalcet (SENSIPAR) 90 MG tablet Take 90 mg by mouth daily. With lunch   Yes [provider]  cloNIDine (CATAPRES) 0.1 MG tablet Take 0.1 mg by mouth 2 (two) times daily. TAKE 0.1MG  BY MOUTH ON Sunday,TUESDAY,THURSDAY, AND SATURDAY 11/08/16  Yes [provider]  clopidogrel (PLAVIX) 75 MG tablet Take 1 tablet (75 mg total) by mouth daily. 01/26/17  Yes Gouru, Illene Silver, MD  colchicine 0.6 MG tablet Take 0.5 tablets (0.3 mg total) by mouth 2 (two) times a week. 07/09/16  Yes Demetrios Loll, MD  docusate sodium (COLACE) 100 MG capsule Take 1 capsule (100 mg total) by mouth daily as needed for mild constipation. 12/13/16  Yes Wieting, Richard, MD  famotidine (PEPCID) 20 MG tablet Take 20 mg by mouth at bedtime.  11/29/16  Yes [provider]  feeding supplement, ENSURE ENLIVE, (ENSURE ENLIVE) LIQD Take 237 mLs by mouth 2 (two) times daily between meals. 01/25/17  Yes Max Sane, MD  ferrous sulfate 325 (65 FE) MG tablet Take 1 tablet (325 mg total) by mouth daily with breakfast. 06/18/16  Yes Fritzi Mandes, MD  fluticasone (FLONASE) 50 MCG/ACT nasal spray Place 2 sprays into both nostrils daily.    Yes [provider]  furosemide (LASIX) 40 MG tablet Take 1 tablet (40 mg total) by mouth daily. 07/05/16  Yes Demetrios Loll, MD  glimepiride (AMARYL) 1 MG tablet Take 1 mg by mouth daily with breakfast.   Yes [provider]  hydrALAZINE (APRESOLINE) 50 MG tablet Take 50 mg by mouth 2 (two) times daily. ONLY ON Tuesday,THURSDAY,SATURDAY ,AND SUNDAY 11/29/16  Yes [provider]  insulin aspart (NOVOLOG) 100 UNIT/ML injection Inject 0-5 Units into the skin at bedtime.  CBG < 70: implement hypoglycemia protocol  CBG 70 - 120: 0 units  CBG 121 - 150: 2 units  CBG 151 - 200: 3 units  CBG 201 - 250: 5 units  CBG 251 - 300: 8 units  CBG 301 - 350: 11 units  CBG 351 - 400: 15 units 03/04/18  Yes Mody, Sital, MD  ipratropium (ATROVENT) 0.03 % nasal spray Place 1 spray into both nostrils 3 (three) times daily.    Yes [provider]  ipratropium-albuterol (DUONEB) 0.5-2.5 (3) MG/3ML SOLN Take 3 mLs by nebulization every 6 (six) hours as needed. 12/14/16  Yes Gladstone Lighter, MD  isosorbide mononitrate (IMDUR) 30 MG 24 hr tablet Take 30 mg by mouth 2 (two) times daily. 11/03/16  Yes [provider]  latanoprost (XALATAN) 0.005 % ophthalmic solution Place 1 drop into both eyes at bedtime. 03/20/16  Yes [provider]  metoprolol succinate (TOPROL-XL) 25 MG 24 hr tablet Take 1 tablet (25 mg total) by mouth at bedtime. 12/13/16  Yes Wieting, Richard, MD  mirtazapine (REMERON) 15 MG tablet Take 15 mg by mouth at bedtime. 02/03/18  Yes [provider]  multivitamin (RENA-VIT) TABS tablet Take 1 tablet by mouth daily.   Yes [provider]  pantoprazole (PROTONIX) 40 MG tablet Take 1 tablet (40 mg total) by mouth daily. 06/18/16  Yes Fritzi Mandes, MD  pravastatin (PRAVACHOL) 40 MG tablet Take 40 mg by mouth at bedtime.  06/21/15  Yes [provider]  sevelamer (RENAGEL) 800 MG tablet Take 800 mg by mouth 3 (three) times daily with meals.   Yes [provider]    Allergies Alprazolam  Family History  Problem Relation Age of Onset  . Heart attack Father   . Hypertension Mother   . Hyperlipidemia Mother   . Coronary artery disease Unknown        strong fam hx  . Kidney failure Brother   . Breast cancer Neg Hx     Social History Social History   Tobacco Use  . Smoking status: Former Smoker    Last attempt to quit: 11/05/1990    Years since quitting: 27.4  . Smokeless tobacco: Never Used  Substance Use  Topics  . Alcohol use: No  . Drug use: No    Review of Systems Constitutional: No fever/chills Eyes: No visual changes. ENT: No sore throat. Cardiovascular: Denies chest pain. Respiratory: Denies shortness of breath. Gastrointestinal: No abdominal pain.  No nausea, no vomiting.  No diarrhea.  No constipation. Genitourinary: Negative for dysuria. Musculoskeletal: Negative for neck pain.  Negative for back pain. Integumentary: Negative for rash. Neurological: Negative for headaches, focal weakness or numbness.  Positive for generalized tremors   ____________________________________________   PHYSICAL EXAM:  VITAL SIGNS: ED Triage Vitals  Enc Vitals Group     BP 04/08/18 0617 116/75     Pulse Rate 04/08/18 0617 62     Resp 04/08/18 0617 17     Temp 04/08/18 0617 97.8 F (36.6 C)     Temp Source 04/08/18 0617 Oral     SpO2 04/08/18 0617 100 %     Weight 04/08/18 0619 77.1 kg (170 lb)     Height --      Head Circumference --      Peak Flow --      Pain Score 04/08/18 0619 1     Pain Loc --      Pain Edu? --      Excl. in [place]? --     Constitutional: Alert and oriented. Well appearing and in no acute distress. Eyes: Conjunctivae are normal. PERRL. EOMI. Head: Atraumatic. Neck: No stridor.  No meningeal signs.  Cardiovascular: Normal rate, regular rhythm. Good peripheral circulation. Grossly normal heart sounds. Respiratory: Normal respiratory effort.  No retractions. Lungs CTAB. Gastrointestinal: Soft and nontender. No distention.  Musculoskeletal: No lower extremity tenderness nor edema. No gross deformities of extremities. Neurologic:  Normal speech and language.  Bilateral upper extremity tremor.  Right lower extremity weakness baseline per patient.  Bilateral upper extremity tremor noted Skin:  Skin is warm, dry and intact. No rash noted. Psychiatric: Mood and affect are normal. Speech and behavior are normal.  ____________________________________________    LABS (all labs ordered are listed, but only abnormal results are displayed)  Labs Reviewed  CBC WITH DIFFERENTIAL/PLATELET - Abnormal; Notable for the following components:  Result Value   RBC 3.35 (*)    MCV 110.8 (*)    MCH 37.9 (*)    RDW 16.1 (*)    All other components within normal limits  COMPREHENSIVE METABOLIC PANEL - Abnormal; Notable for the following components:   Chloride 99 (*)    Glucose, Bld 131 (*)    BUN 48 (*)    Creatinine, Ser 5.94 (*)    Calcium 8.7 (*)    Alkaline Phosphatase 173 (*)    GFR calc non Af Amer 6 (*)    GFR calc Af Amer 7 (*)    All other components within normal limits  PHOSPHORUS - Abnormal; Notable for the following components:   Phosphorus 5.5 (*)    All other components within normal limits  GLUCOSE, CAPILLARY - Abnormal; Notable for the following components:   Glucose-Capillary 234 (*)    All other components within normal limits  GLUCOSE, CAPILLARY - Abnormal; Notable for the following components:   Glucose-Capillary 230 (*)    All other components within normal limits  MRSA PCR SCREENING  MAGNESIUM  VITAMIN B12  TSH  GLUCOSE, CAPILLARY  BASIC METABOLIC PANEL  CBC   ____________________________________________  EKG  ED ECG REPORT I, [place] Abryana Lykens, the attending physician, personally viewed and interpreted this ECG.   Date: 04/09/2018  EKG Time: 6:13 AM  Rate: 63  Rhythm: Normal sinus rhythm with  premature ventricular contraction  Axis: Normal  Intervals: Normal  ST&T Change: None  ____________________________________________  RADIOLOGY I, [place] Ernst Bowler, personally viewed and evaluated these images (plain radiographs) as part of my medical decision making, as well as reviewing the written report by the radiologist.  ED MD interpretation: CT head revealed no acute intracranial findings per radiologist  Official radiology report(s): Ct Head Wo Contrast  Result Date: 04/08/2018 CLINICAL DATA:  [place] at  home. Persistent tremor since dialysis. History of hypertension, hyperlipidemia. EXAM: CT HEAD WITHOUT CONTRAST TECHNIQUE: Contiguous axial images were obtained from the base of the skull through the vertex without intravenous contrast. COMPARISON:  MRI head February 28, 2018 FINDINGS: BRAIN: No intraparenchymal hemorrhage, mass effect nor midline shift. Moderate parenchymal brain volume loss. No hydrocephalus. Patchy supratentorial white matter hypodensities within normal range for patient's age, though non-specific are most compatible with chronic small vessel ischemic disease. No acute large vascular territory infarcts. No abnormal extra-axial fluid collections. Basal cisterns are patent. VASCULAR: Severe calcific atherosclerosis of the carotid siphons. SKULL: No skull fracture. No significant scalp soft tissue swelling. SINUSES/ORBITS: The mastoid air-cells and included paranasal sinuses are well-aerated.The included ocular globes and orbital contents are non-suspicious. Status post left ocular lens implant. OTHER: None. IMPRESSION: 1. No acute intracranial process. 2. Stable moderate parenchymal brain volume loss and mild chronic small vessel ischemic changes. 3. Severe atherosclerosis. Electronically Signed   By: Elon Alas M.D.   On: 04/08/2018 06:41   Mr Brain Wo Contrast  Result Date: 04/09/2018 CLINICAL DATA:  Recurrent syncope EXAM: MRI HEAD WITHOUT CONTRAST TECHNIQUE: Multiplanar, multiecho pulse sequences of the brain and surrounding structures were obtained without intravenous contrast. COMPARISON:  Head CT 04/08/2018 Brain MRI 02/28/2018 FINDINGS: BRAIN: The midline structures are normal. There is no acute infarct or acute hemorrhage. There is no mass lesion or other mass effect. There is no hydrocephalus, dural abnormality or extra-axial collection. Multifocal white matter hyperintensity, most commonly due to chronic ischemic microangiopathy. Generalized atrophy without lobar predilection. No  chronic microhemorrhage or superficial siderosis. VASCULAR: Major intracranial arterial and venous  sinus flow voids are preserved. SKULL AND UPPER CERVICAL SPINE: The visualized skull base, calvarium, upper cervical spine and extracranial soft tissues are normal. SINUSES/ORBITS: No fluid levels or advanced mucosal thickening. No mastoid or middle ear effusion. The orbits are normal. IMPRESSION: Atrophy and chronic small vessel disease without acute abnormality. Electronically Signed   By: Ulyses Jarred M.D.   On: 04/09/2018 00:51     Procedures   ____________________________________________   INITIAL IMPRESSION / ASSESSMENT AND PLAN / ED COURSE  As part of my medical decision making, I reviewed the following data within the electronic MEDICAL RECORD NUMBER   77 year old female presented with above-stated history and physical exam.  Laboratory data consistent with previous lab data. patient unable to ambulate and no clear etiology for the patient's tremors identified.  Patient discussed with hospitalist for hospital admission for further evaluation and management. ____________________________________________  FINAL CLINICAL IMPRESSION(S) / ED DIAGNOSES  Final diagnoses:  Tremors of nervous system     MEDICATIONS GIVEN DURING THIS VISIT:  Medications  cinacalcet (SENSIPAR) tablet 90 mg (90 mg Oral Given 04/08/18 1221)  latanoprost (XALATAN) 0.005 % ophthalmic solution 1 drop (1 drop Both Eyes Given 04/08/18 2046)  aspirin EC tablet 81 mg (81 mg Oral Given 04/08/18 1635)  ferrous sulfate tablet 325 mg (has no administration in time range)  pantoprazole (PROTONIX) EC tablet 40 mg (40 mg Oral Given 04/08/18 1221)  fluticasone (FLONASE) 50 MCG/ACT nasal spray 2 spray (2 sprays Each Nare Not Given 04/08/18 1222)  pravastatin (PRAVACHOL) tablet 40 mg (40 mg Oral Given 04/08/18 2043)  multivitamin (RENA-VIT) tablet 1 tablet (1 tablet Oral Not Given 04/08/18 1225)  colchicine tablet 0.3 mg (has no  administration in time range)  furosemide (LASIX) tablet 40 mg (40 mg Oral Given 04/08/18 1635)  famotidine (PEPCID) tablet 20 mg (20 mg Oral Given 04/08/18 2043)  metoprolol succinate (TOPROL-XL) 24 hr tablet 25 mg (25 mg Oral Given 04/08/18 2044)  docusate sodium (COLACE) capsule 100 mg (has no administration in time range)  amLODipine (NORVASC) tablet 10 mg (10 mg Oral Not Given 04/08/18 1610)  isosorbide mononitrate (IMDUR) 24 hr tablet 30 mg (30 mg Oral Given 04/08/18 2044)  feeding supplement (ENSURE ENLIVE) (ENSURE ENLIVE) liquid 237 mL (237 mLs Oral Given 04/08/18 1643)  clopidogrel (PLAVIX) tablet 75 mg (75 mg Oral Given 04/08/18 1221)  mirtazapine (REMERON) tablet 15 mg (15 mg Oral Given 04/08/18 2043)  brimonidine (ALPHAGAN) 0.2 % ophthalmic solution 1 drop (1 drop Left Eye Given 04/08/18 2046)  allopurinol (ZYLOPRIM) tablet 200 mg (200 mg Oral Given 04/08/18 2043)  sevelamer carbonate (RENVELA) tablet 800 mg (800 mg Oral Given 04/08/18 1758)  insulin aspart (novoLOG) injection 0-5 Units (5 Units Subcutaneous Given 04/08/18 2051)  insulin aspart (novoLOG) injection 0-9 Units (0 Units Subcutaneous Not Given 04/08/18 1705)  heparin injection 5,000 Units (5,000 Units Subcutaneous Given 04/08/18 2044)  acetaminophen (TYLENOL) tablet 650 mg (has no administration in time range)    Or  acetaminophen (TYLENOL) suppository 650 mg (has no administration in time range)  senna-docusate (Senokot-S) tablet 1 tablet (has no administration in time range)  ondansetron (ZOFRAN) tablet 4 mg (has no administration in time range)    Or  ondansetron (ZOFRAN) injection 4 mg (has no administration in time range)  hydrALAZINE (APRESOLINE) tablet 50 mg (50 mg Oral Not Given 04/08/18 1611)    And  hydrALAZINE (APRESOLINE) tablet 50 mg (50 mg Oral Not Given 04/08/18 1706)  cloNIDine (CATAPRES) tablet 0.1 mg (0.1 mg  Oral Not Given 04/08/18 1610)    And  cloNIDine (CATAPRES) tablet 0.1 mg (0.1 mg Oral Not Given 04/08/18 1707)  Chlorhexidine  Gluconate Cloth 2 % PADS 6 each (has no administration in time range)     ED Discharge Orders    None       Note:  This document was prepared using Dragon voice recognition software and may include unintentional dictation errors.    Gregor Hams, MD 04/09/18 [phone]

## 2018-04-08 NOTE — ED Triage Notes (Signed)
Patient coming [facility] from home post fall. Patient reports she began to have muscle spasms/tremors yesterday before dialysis that have persisted since. Patient fell when backwards onto buttocks while walking in the bathroom today. Patient c/o buttock pain post fall.

## 2018-04-08 NOTE — Progress Notes (Signed)
[facility]  ROUNDING NOTE   Subjective:   Laurie Steele admitted to [facility] on 04/08/2018 for Tremors of nervous system [R25.1]  Last hemodialysis was yesterday. She states the tremors started when on hemodialysis treatment. She completed her dialysis treatment and went home but presented to the ED when the tremors got worse and then fell at home.   Objective:  Vital signs in last 24 hours:  Temp:  [97.8 F (36.6 C)-98 F (36.7 C)] 98 F (36.7 C) (06/04 1213) Pulse Rate:  [60-69] 69 (06/04 1213) Resp:  [16-18] 17 (06/04 1213) BP: (116-131)/(53-75) 130/53 (06/04 1213) SpO2:  [99 %-100 %] 100 % (06/04 1213) Weight:  [77.1 kg (170 lb)] 77.1 kg (170 lb) (06/04 0619)  Weight change:  Filed Weights   04/08/18 0619  Weight: 77.1 kg (170 lb)    Intake/Output: No intake/output data recorded.   Intake/Output this shift:  No intake/output data recorded.  Physical Exam: General: NAD, laying in bed  Head: Normocephalic, atraumatic. Moist oral mucosal membranes  Eyes: Anicteric, PERRL  Neck: Supple, trachea midline  Lungs:  Clear to auscultation  Heart: Regular rate and rhythm  Abdomen:  Soft, nontender,   Extremities: no peripheral edema.  Neurologic: Nonfocal, moving all four extremities, no resting tremor on examination  Skin: No lesions  Access: Left AVF    Basic Metabolic Panel: Recent Labs  Lab 04/08/18 0618  NA 142  K 3.6  CL 99*  CO2 28  GLUCOSE 131*  BUN 48*  CREATININE 5.94*  CALCIUM 8.7*  MG 2.1  PHOS 5.5*    Liver Function Tests: Recent Labs  Lab 04/08/18 0618  AST 33  ALT 31  ALKPHOS 173*  BILITOT 0.7  PROT 7.8  ALBUMIN 3.6   No results for input(s): LIPASE, AMYLASE in the last 168 hours. No results for input(s): AMMONIA in the last 168 hours.  CBC: Recent Labs  Lab 04/08/18 0618  WBC 7.7  NEUTROABS 4.5  HGB 12.7  HCT 37.1  MCV 110.8*  PLT 174    Cardiac Enzymes: No results for input(s): CKTOTAL, CKMB,  CKMBINDEX, TROPONINI in the last 168 hours.  BNP: Invalid input(s): POCBNP  CBG: Recent Labs  Lab 04/08/18 1147  GLUCAP 234*    Microbiology: Results for orders placed or performed during the hospital encounter of 04/08/18  MRSA PCR Screening     Status: None   Collection Time: 04/08/18 11:14 AM  Result Value Ref Range Status   MRSA by PCR NEGATIVE NEGATIVE Final    Comment:        The GeneXpert MRSA Assay (FDA approved for NASAL specimens only), is one component of a comprehensive MRSA colonization surveillance program. It is not intended to diagnose MRSA infection nor to guide or monitor treatment for MRSA infections. Performed at [facility], [address] [place], [place] [address]     Coagulation Studies: No results for input(s): LABPROT, INR in the last 72 hours.  Urinalysis: No results for input(s): COLORURINE, LABSPEC, PHURINE, GLUCOSEU, HGBUR, BILIRUBINUR, KETONESUR, PROTEINUR, UROBILINOGEN, NITRITE, LEUKOCYTESUR in the last 72 hours.  Invalid input(s): APPERANCEUR    Imaging: Ct Head Wo Contrast  Result Date: 04/08/2018 CLINICAL DATA:  [place] at home. Persistent tremor since dialysis. History of hypertension, hyperlipidemia. EXAM: CT HEAD WITHOUT CONTRAST TECHNIQUE: Contiguous axial images were obtained from the base of the skull through the vertex without intravenous contrast. COMPARISON:  MRI head February 28, 2018 FINDINGS: BRAIN: No intraparenchymal hemorrhage, mass effect nor  midline shift. Moderate parenchymal brain volume loss. No hydrocephalus. Patchy supratentorial white matter hypodensities within normal range for patient's age, though non-specific are most compatible with chronic small vessel ischemic disease. No acute large vascular territory infarcts. No abnormal extra-axial fluid collections. Basal cisterns are patent. VASCULAR: Severe calcific atherosclerosis of the carotid siphons. SKULL: No skull fracture. No significant scalp soft tissue  swelling. SINUSES/ORBITS: The mastoid air-cells and included paranasal sinuses are well-aerated.The included ocular globes and orbital contents are non-suspicious. Status post left ocular lens implant. OTHER: None. IMPRESSION: 1. No acute intracranial process. 2. Stable moderate parenchymal brain volume loss and mild chronic small vessel ischemic changes. 3. Severe atherosclerosis. Electronically Signed   By: Elon Alas M.D.   On: 04/08/2018 06:41     Medications:    . allopurinol  200 mg Oral QHS  . amLODipine  10 mg Oral Daily  . aspirin EC  81 mg Oral Daily  . brimonidine  1 drop Left Eye TID  . cinacalcet  90 mg Oral Q lunch  . cloNIDine  0.1 mg Oral Q T,Th,S,Su   And  . cloNIDine  0.1 mg Oral Q T,Th,S,Su-1800  . clopidogrel  75 mg Oral Daily  . [START ON 04/10/2018] colchicine  0.3 mg Oral Once per day on Mon Thu  . famotidine  20 mg Oral QHS  . feeding supplement (ENSURE ENLIVE)  237 mL Oral BID BM  . [START ON 04/09/2018] ferrous sulfate  325 mg Oral Q breakfast  . fluticasone  2 spray Each Nare Daily  . furosemide  40 mg Oral Daily  . heparin  5,000 Units Subcutaneous Q8H  . hydrALAZINE  50 mg Oral Q T,Th,S,Su   And  . hydrALAZINE  50 mg Oral Q T,Th,S,Su-1800  . insulin aspart  0-5 Units Subcutaneous QHS  . insulin aspart  0-9 Units Subcutaneous TID WC  . isosorbide mononitrate  30 mg Oral BID  . latanoprost  1 drop Both Eyes QHS  . metoprolol succinate  25 mg Oral QHS  . mirtazapine  15 mg Oral QHS  . multivitamin  1 tablet Oral Daily  . pantoprazole  40 mg Oral Daily  . pravastatin  40 mg Oral QHS  . sevelamer carbonate  800 mg Oral TID WC   acetaminophen **OR** acetaminophen, docusate sodium, ondansetron **OR** ondansetron (ZOFRAN) IV, senna-docusate  Assessment/ Plan:  Laurie Steele is a 77 y.o. black female Laurie Steele is a 77 y.o. black female with end stage renal disease on hemodialysis, coronary artery disease, hypertension, diabetes mellitus  type II, GERD, glaucoma, gout, who presents to [facility] on 02/27/2018 for altered mental status and urinary tract infection.   [place]  Left AVF 76kg  1. End Stage Renal Disease: MWF - Continue MWF schedule.   2.  Tremors: recent encephelopathy from baclofen. However this time with no new medications - Appreciate neurology input.   3. Secondary Hyperparathyroidism: outpatient labs: 5/13: PTh 424, calcium 8.1, phosphorus 4.6 - sevelamer 3 tablets with meals and 1 with snacks.   4 Anemia of chronic kidney disease: hemoglobin 12.7 - EPO as outpatient  5. Hypertension: blood pressure at goal. Holding home blood pressure medications today. Home regimen of metoprolol, furosemide, isosorbide mononitrate, hydralazine, and amlodipine.   6. Diabetes mellitus type II with chronic kidney disease: noninsulin dependent. Hemoglobin A1c from 3/22 of 6.6%    LOS: 0 Mariel Gaudin 6/4/20191:26 PM

## 2018-04-08 NOTE — Care Management (Signed)
Amanda Morris dialysis liaison notified of admission.    

## 2018-04-09 ENCOUNTER — Observation Stay: Payer: Medicare Other

## 2018-04-09 DIAGNOSIS — R251 Tremor, unspecified: Secondary | ICD-10-CM | POA: Diagnosis not present

## 2018-04-09 LAB — GLUCOSE, CAPILLARY
Glucose-Capillary: 141 mg/dL — ABNORMAL HIGH (ref 65–99)
Glucose-Capillary: 180 mg/dL — ABNORMAL HIGH (ref 65–99)
Glucose-Capillary: 199 mg/dL — ABNORMAL HIGH (ref 65–99)

## 2018-04-09 LAB — CBC
HEMATOCRIT: 35.8 % (ref 35.0–47.0)
Hemoglobin: 11.9 g/dL — ABNORMAL LOW (ref 12.0–16.0)
MCH: 37.1 pg — AB (ref 26.0–34.0)
MCHC: 33.2 g/dL (ref 32.0–36.0)
MCV: 111.8 fL — AB (ref 80.0–100.0)
PLATELETS: 173 10*3/uL (ref 150–440)
RBC: 3.2 MIL/uL — ABNORMAL LOW (ref 3.80–5.20)
RDW: 16.1 % — ABNORMAL HIGH (ref 11.5–14.5)
WBC: 6.7 10*3/uL (ref 3.6–11.0)

## 2018-04-09 LAB — BASIC METABOLIC PANEL
Anion gap: 16 — ABNORMAL HIGH (ref 5–15)
BUN: 66 mg/dL — AB (ref 6–20)
CO2: 25 mmol/L (ref 22–32)
Calcium: 8 mg/dL — ABNORMAL LOW (ref 8.9–10.3)
Chloride: 99 mmol/L — ABNORMAL LOW (ref 101–111)
Creatinine, Ser: 7.9 mg/dL — ABNORMAL HIGH (ref 0.44–1.00)
GFR calc Af Amer: 5 mL/min — ABNORMAL LOW (ref >60)
GFR calc non Af Amer: 4 mL/min — ABNORMAL LOW (ref >60)
GLUCOSE: 139 mg/dL — AB (ref 65–99)
POTASSIUM: 4.6 mmol/L (ref 3.5–5.1)
SODIUM: 140 mmol/L (ref 135–145)

## 2018-04-09 NOTE — Discharge Summary (Addendum)
[facility] [place] NAME: Laurie Steele    MR#:  [id number]  DATE OF BIRTH:  Mar 20, 1941  DATE OF ADMISSION:  04/08/2018 ADMITTING PHYSICIAN: Bettey Costa, MD  DATE OF DISCHARGE: 04/09/2018  PRIMARY CARE PHYSICIAN: [name], MD    ADMISSION DIAGNOSIS:  Tremors of nervous system [R25.1]  DISCHARGE DIAGNOSIS:  Active Problems:   Tremors of nervous system   SECONDARY DIAGNOSIS:   Past Medical History:  Diagnosis Date  . Anxiety   . CAD (coronary artery disease)   . Chronic anemia    Dr Inez Pilgrim  . Dysrhythmia   . GERD (gastroesophageal reflux disease)   . Glaucoma   . Gout   . Gout   . Hx of colonic polyps   . Hyperlipidemia   . Hypertension   . Lower back pain   . Miscarriage   . Myocardial infarction ([place])   . 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)    Dialysis M-W-F  . Neuropathy   . 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 Peritoneal dialysis status ([place])   . Renal failure    Dr Holley Raring  . Renal insufficiency   . 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 Vaginal delivery    x 4    HOSPITAL COURSE:  77 year old female with end-stage renal disease on hemodialysis, history of CVA with right residual weakness and diabetes who presents due to mechanical fall with tremors.  1.  Tremors: Due to tremors may be related to orthostasis.  Norvasc and isosorbide have been discontinued for now.  She will continue other outpatient medications.  Will have home health nurse check orthostatic vitals.   Patient had MRI and EEG both of which were essentially unremarkable.  Patient was evaluated by neurology as well.  B12 and TSH level were all within normal limits as were electrolytes.  2.  End-stage renal disease on hemodialysis: She will continue scheduled dialysis Monday, Wednesday and Friday.  3.  Mechanical fall: Patient prefers to go home with home  health.  4.  Diabetes: She will resume her outpatient regimen with ADA diet5.  Essential hypertension: Continue  metoprolol, on nondialysis days she is on hydralazine, clonidine.   6.  History of CVA with right residual weakness: Continue Plavix, aspirin and statin  7.  CAD: Continue metoprolol, Plavix, aspirin and statin     DISCHARGE CONDITIONS AND DIET:   Stable for discharge on renal/diabetic diet  CONSULTS OBTAINED:  Treatment Team:  Alexis Goodell, MD Lavonia Dana, MD  DRUG ALLERGIES:   Allergies  Allergen Reactions  . Alprazolam Other (See Comments)    Unable to sleep and confusion    DISCHARGE MEDICATIONS:   Allergies as of 04/09/2018      Reactions   Alprazolam Other (See Comments)   Unable to sleep and confusion      Medication List    STOP taking these medications   amLODipine 10 MG tablet Commonly known as:  NORVASC   isosorbide mononitrate 30 MG 24 hr tablet Commonly known as:  IMDUR     TAKE these medications   acetaminophen 650 MG CR tablet Commonly known as:  TYLENOL Take 650 mg by mouth every 8 (eight) hours as needed for pain.   allopurinol 100 MG tablet Commonly known as:  ZYLOPRIM Take 200 mg by mouth at bedtime.   aspirin EC 81 MG tablet Take 1  tablet (81 mg total) by mouth daily.   brimonidine 0.2 % ophthalmic solution Commonly known as:  ALPHAGAN Place 1 drop into the left eye 3 (three) times daily.   cinacalcet 90 MG tablet Commonly known as:  SENSIPAR Take 90 mg by mouth daily. With lunch   cloNIDine 0.1 MG tablet Commonly known as:  CATAPRES Take 0.1 mg by mouth 2 (two) times daily. TAKE 0.1MG  BY MOUTH ON Sunday,TUESDAY,THURSDAY, AND SATURDAY   clopidogrel 75 MG tablet Commonly known as:  PLAVIX Take 1 tablet (75 mg total) by mouth daily.   colchicine 0.6 MG tablet Take 0.5 tablets (0.3 mg total) by mouth 2 (two) times a week.   docusate sodium 100 MG capsule Commonly known as:  COLACE Take 1 capsule (100 mg  total) by mouth daily as needed for mild constipation.   famotidine 20 MG tablet Commonly known as:  PEPCID Take 20 mg by mouth at bedtime.   feeding supplement (ENSURE ENLIVE) Liqd Take 237 mLs by mouth 2 (two) times daily between meals.   ferrous sulfate 325 (65 FE) MG tablet Take 1 tablet (325 mg total) by mouth daily with breakfast.   fluticasone 50 MCG/ACT nasal spray Commonly known as:  FLONASE Place 2 sprays into both nostrils daily.   furosemide 40 MG tablet Commonly known as:  LASIX Take 1 tablet (40 mg total) by mouth daily.   glimepiride 1 MG tablet Commonly known as:  AMARYL Take 1 mg by mouth daily with breakfast.   hydrALAZINE 50 MG tablet Commonly known as:  APRESOLINE Take 50 mg by mouth 2 (two) times daily. ONLY ON Tuesday,THURSDAY,SATURDAY ,AND SUNDAY   insulin aspart 100 UNIT/ML injection Commonly known as:  novoLOG Inject 0-5 Units into the skin at bedtime. CBG < 70: implement hypoglycemia protocol  CBG 70 - 120: 0 units  CBG 121 - 150: 2 units  CBG 151 - 200: 3 units  CBG 201 - 250: 5 units  CBG 251 - 300: 8 units  CBG 301 - 350: 11 units  CBG 351 - 400: 15 units   ipratropium 0.03 % nasal spray Commonly known as:  ATROVENT Place 1 spray into both nostrils 3 (three) times daily.   ipratropium-albuterol 0.5-2.5 (3) MG/3ML Soln Commonly known as:  DUONEB Take 3 mLs by nebulization every 6 (six) hours as needed.   latanoprost 0.005 % ophthalmic solution Commonly known as:  XALATAN Place 1 drop into both eyes at bedtime.   metoprolol succinate 25 MG 24 hr tablet Commonly known as:  TOPROL-XL Take 1 tablet (25 mg total) by mouth at bedtime.   mirtazapine 15 MG tablet Commonly known as:  REMERON Take 15 mg by mouth at bedtime.   multivitamin Tabs tablet Take 1 tablet by mouth daily.   pantoprazole 40 MG tablet Commonly known as:  PROTONIX Take 1 tablet (40 mg total) by mouth daily.   pravastatin 40 MG tablet Commonly known as:   PRAVACHOL Take 40 mg by mouth at bedtime.   sevelamer 800 MG tablet Commonly known as:  RENAGEL Take 800 mg by mouth 3 (three) times daily with meals.         Today   CHIEF COMPLAINT:   No acute issues overnight.   VITAL SIGNS:  Blood pressure (!) 125/53, pulse 64, temperature (!) 97.5 F (36.4 C), temperature source Oral, resp. rate 16, weight 77.1 kg (170 lb), SpO2 97 %.   REVIEW OF SYSTEMS:  Review of Systems  Constitutional: Negative.  Negative  for chills, fever and malaise/fatigue.  HENT: Negative.  Negative for ear discharge, ear pain, hearing loss, nosebleeds and sore throat.   Eyes: Negative.  Negative for blurred vision and pain.  Respiratory: Negative.  Negative for cough, hemoptysis, shortness of breath and wheezing.   Cardiovascular: Negative.  Negative for chest pain, palpitations and leg swelling.  Gastrointestinal: Negative.  Negative for abdominal pain, blood in stool, diarrhea, nausea and vomiting.  Genitourinary: Negative.  Negative for dysuria.  Musculoskeletal: Negative.  Negative for back pain.  Skin: Negative.   Neurological: Negative for dizziness, tremors, speech change, focal weakness (baseline from previous CVA), seizures and headaches.  Endo/Heme/Allergies: Negative.  Does not bruise/bleed easily.  Psychiatric/Behavioral: Negative.  Negative for depression, hallucinations and suicidal ideas.     PHYSICAL EXAMINATION:  GENERAL:  77 y.o.-year-old patient lying in the bed with no acute distress.  NECK:  Supple, no jugular venous distention. No thyroid enlargement, no tenderness.  LUNGS: Normal breath sounds bilaterally, no wheezing, rales,rhonchi  No use of accessory muscles of respiration.  CARDIOVASCULAR: S1, S2 normal. No murmurs, rubs, or gallops.  ABDOMEN: Soft, non-tender, non-distended. Bowel sounds present. No organomegaly or mass.  EXTREMITIES: No pedal edema, cyanosis, or clubbing.  PSYCHIATRIC: The patient is alert and oriented x 3.   SKIN: No obvious rash, lesion, or ulcer.   DATA REVIEW:   CBC Recent Labs  Lab 04/09/18 0537  WBC 6.7  HGB 11.9*  HCT 35.8  PLT 173    Chemistries  Recent Labs  Lab 04/08/18 0618 04/09/18 0537  NA 142 140  K 3.6 4.6  CL 99* 99*  CO2 28 25  GLUCOSE 131* 139*  BUN 48* 66*  CREATININE 5.94* 7.90*  CALCIUM 8.7* 8.0*  MG 2.1  --   AST 33  --   ALT 31  --   ALKPHOS 173*  --   BILITOT 0.7  --     Cardiac Enzymes No results for input(s): TROPONINI in the last 168 hours.  Microbiology Results  @MICRORSLT48 @  RADIOLOGY:  Ct Head Wo Contrast  Result Date: 04/08/2018 CLINICAL DATA:  [place] at home. Persistent tremor since dialysis. History of hypertension, hyperlipidemia. EXAM: CT HEAD WITHOUT CONTRAST TECHNIQUE: Contiguous axial images were obtained from the base of the skull through the vertex without intravenous contrast. COMPARISON:  MRI head February 28, 2018 FINDINGS: BRAIN: No intraparenchymal hemorrhage, mass effect nor midline shift. Moderate parenchymal brain volume loss. No hydrocephalus. Patchy supratentorial white matter hypodensities within normal range for patient's age, though non-specific are most compatible with chronic small vessel ischemic disease. No acute large vascular territory infarcts. No abnormal extra-axial fluid collections. Basal cisterns are patent. VASCULAR: Severe calcific atherosclerosis of the carotid siphons. SKULL: No skull fracture. No significant scalp soft tissue swelling. SINUSES/ORBITS: The mastoid air-cells and included paranasal sinuses are well-aerated.The included ocular globes and orbital contents are non-suspicious. Status post left ocular lens implant. OTHER: None. IMPRESSION: 1. No acute intracranial process. 2. Stable moderate parenchymal brain volume loss and mild chronic small vessel ischemic changes. 3. Severe atherosclerosis. Electronically Signed   By: Elon Alas M.D.   On: 04/08/2018 06:41   Mr Brain Wo Contrast  Result  Date: 04/09/2018 CLINICAL DATA:  Recurrent syncope EXAM: MRI HEAD WITHOUT CONTRAST TECHNIQUE: Multiplanar, multiecho pulse sequences of the brain and surrounding structures were obtained without intravenous contrast. COMPARISON:  Head CT 04/08/2018 Brain MRI 02/28/2018 FINDINGS: BRAIN: The midline structures are normal. There is no acute infarct or acute hemorrhage. There is no  mass lesion or other mass effect. There is no hydrocephalus, dural abnormality or extra-axial collection. Multifocal white matter hyperintensity, most commonly due to chronic ischemic microangiopathy. Generalized atrophy without lobar predilection. No chronic microhemorrhage or superficial siderosis. VASCULAR: Major intracranial arterial and venous sinus flow voids are preserved. SKULL AND UPPER CERVICAL SPINE: The visualized skull base, calvarium, upper cervical spine and extracranial soft tissues are normal. SINUSES/ORBITS: No fluid levels or advanced mucosal thickening. No mastoid or middle ear effusion. The orbits are normal. IMPRESSION: Atrophy and chronic small vessel disease without acute abnormality. Electronically Signed   By: Ulyses Jarred M.D.   On: 04/09/2018 00:51      Allergies as of 04/09/2018      Reactions   Alprazolam Other (See Comments)   Unable to sleep and confusion      Medication List    STOP taking these medications   amLODipine 10 MG tablet Commonly known as:  NORVASC   isosorbide mononitrate 30 MG 24 hr tablet Commonly known as:  IMDUR     TAKE these medications   acetaminophen 650 MG CR tablet Commonly known as:  TYLENOL Take 650 mg by mouth every 8 (eight) hours as needed for pain.   allopurinol 100 MG tablet Commonly known as:  ZYLOPRIM Take 200 mg by mouth at bedtime.   aspirin EC 81 MG tablet Take 1 tablet (81 mg total) by mouth daily.   brimonidine 0.2 % ophthalmic solution Commonly known as:  ALPHAGAN Place 1 drop into the left eye 3 (three) times daily.   cinacalcet 90 MG  tablet Commonly known as:  SENSIPAR Take 90 mg by mouth daily. With lunch   cloNIDine 0.1 MG tablet Commonly known as:  CATAPRES Take 0.1 mg by mouth 2 (two) times daily. TAKE 0.1MG  BY MOUTH ON Sunday,TUESDAY,THURSDAY, AND SATURDAY   clopidogrel 75 MG tablet Commonly known as:  PLAVIX Take 1 tablet (75 mg total) by mouth daily.   colchicine 0.6 MG tablet Take 0.5 tablets (0.3 mg total) by mouth 2 (two) times a week.   docusate sodium 100 MG capsule Commonly known as:  COLACE Take 1 capsule (100 mg total) by mouth daily as needed for mild constipation.   famotidine 20 MG tablet Commonly known as:  PEPCID Take 20 mg by mouth at bedtime.   feeding supplement (ENSURE ENLIVE) Liqd Take 237 mLs by mouth 2 (two) times daily between meals.   ferrous sulfate 325 (65 FE) MG tablet Take 1 tablet (325 mg total) by mouth daily with breakfast.   fluticasone 50 MCG/ACT nasal spray Commonly known as:  FLONASE Place 2 sprays into both nostrils daily.   furosemide 40 MG tablet Commonly known as:  LASIX Take 1 tablet (40 mg total) by mouth daily.   glimepiride 1 MG tablet Commonly known as:  AMARYL Take 1 mg by mouth daily with breakfast.   hydrALAZINE 50 MG tablet Commonly known as:  APRESOLINE Take 50 mg by mouth 2 (two) times daily. ONLY ON Tuesday,THURSDAY,SATURDAY ,AND SUNDAY   insulin aspart 100 UNIT/ML injection Commonly known as:  novoLOG Inject 0-5 Units into the skin at bedtime. CBG < 70: implement hypoglycemia protocol  CBG 70 - 120: 0 units  CBG 121 - 150: 2 units  CBG 151 - 200: 3 units  CBG 201 - 250: 5 units  CBG 251 - 300: 8 units  CBG 301 - 350: 11 units  CBG 351 - 400: 15 units   ipratropium 0.03 % nasal spray Commonly  known as:  ATROVENT Place 1 spray into both nostrils 3 (three) times daily.   ipratropium-albuterol 0.5-2.5 (3) MG/3ML Soln Commonly known as:  DUONEB Take 3 mLs by nebulization every 6 (six) hours as needed.   latanoprost 0.005 %  ophthalmic solution Commonly known as:  XALATAN Place 1 drop into both eyes at bedtime.   metoprolol succinate 25 MG 24 hr tablet Commonly known as:  TOPROL-XL Take 1 tablet (25 mg total) by mouth at bedtime.   mirtazapine 15 MG tablet Commonly known as:  REMERON Take 15 mg by mouth at bedtime.   multivitamin Tabs tablet Take 1 tablet by mouth daily.   pantoprazole 40 MG tablet Commonly known as:  PROTONIX Take 1 tablet (40 mg total) by mouth daily.   pravastatin 40 MG tablet Commonly known as:  PRAVACHOL Take 40 mg by mouth at bedtime.   sevelamer 800 MG tablet Commonly known as:  RENAGEL Take 800 mg by mouth 3 (three) times daily with meals.           Management plans discussed with the patient and she is in agreement. Stable for discharge   Patient should follow up with pcp  CODE STATUS:     Code Status Orders  (From admission, onward)        Start     Ordered   04/08/18 1020  Full code  Continuous     04/08/18 1019    Code Status History    Date Active Date Inactive Code Status Order ID Comments User Context   02/28/2018 0126 03/04/2018 1856 Full Code [id number]  Arta Silence, MD Inpatient   01/22/2017 1902 01/26/2017 2158 Full Code [id number]  Hillary Bow, MD ED   12/11/2016 1344 12/14/2016 1705 Full Code [id number]  Gladstone Lighter, MD Inpatient   07/03/2016 2015 07/05/2016 1943 Full Code [id number]  Vaughan Basta, MD Inpatient   06/15/2016 1915 06/18/2016 2026 Full Code [id number]  Hugelmeyer, [place], DO Inpatient   05/21/2016 1628 05/28/2016 1811 Full Code [id number]  Henreitta Leber, MD Inpatient   04/05/2016 1614 04/06/2016 2107 Full Code [id number]  Loletha Grayer, MD ED   03/14/2016 2350 03/16/2016 1534 Full Code [id number]  Max Sane, MD Inpatient      TOTAL TIME TAKING CARE OF THIS PATIENT: 38 minutes.    Note: This dictation was prepared with Dragon dictation along with smaller phrase technology. Any transcriptional errors that result  from this process are unintentional.  Chealsea Paske M.D on 04/09/2018 at 10:32 AM  Between 7am to 6pm - Pager - [phone] After 6pm go to [email or web address] - password [email or web address]  Office  [phone]  CC: Primary care physician; [name], MD

## 2018-04-09 NOTE — Progress Notes (Signed)
Pre HD Assessment, pt denies complaint, planned for D/C post Tx.    04/09/18 1338  Neurological  Level of Consciousness Alert  Orientation Level Oriented X4  Respiratory  Respiratory Pattern Regular  Chest Assessment Chest expansion symmetrical  Bilateral Breath Sounds Diminished  Cough None  Cardiac  Pulse Regular  Heart Sounds S1, S2  ECG Monitor Yes  Vascular  R Radial Pulse +2  L Radial Pulse +2  Edema Generalized  Psychosocial  Psychosocial (WDL) WDL

## 2018-04-09 NOTE — Care Management Note (Addendum)
Case Management Note  Patient Details  Name: Laurie Steele MRN: [id number] Date of Birth: 1941/09/09  Subjective/Objective:     Patient admitted to [facility] under observation status for tremors of nervous sytem. RNCM consulted on patient. Patient is currently open with HHPT,OT and nursing services with [facility]. She uses a walker, shower chair and [employer] as her DME. Lives with her son Laurie Steele [phone] and he assists with her as needed. Her PCP is Dr. Ginette Pitman with [facility]. [employer] and has no issues obtaining medications. No transportation concerns.              Action/Plan:  Patient to resume services HHPT, OT and nursing with [facility] at discharge. RNCM notified Laurie Steele from [place] [facility] that resumption of services were needed. Notified Laurie Steele HD liaison of discahrge Expected Discharge Date:  04/09/18               Expected Discharge Plan:     In-House Referral:     Discharge planning Services     Post Acute Care Choice:    Choice offered to:     DME Arranged:    DME Agency:     HH Arranged:    HH Agency:     Status of Service:     If discussed at [employer] of Stay Meetings, dates discussed:    Additional Comments:  Laurie Steele A Laurie Tackett, RN 04/09/2018, 10:03 AM

## 2018-04-09 NOTE — Progress Notes (Signed)
Subjective: Patient reports that tremor is improved.    Objective: Current vital signs: BP (!) 125/53 (BP Location: Right Arm)   Pulse 64   Temp (!) 97.5 F (36.4 C) (Oral)   Resp 16   Wt 77.1 kg (170 lb)   SpO2 97%   BMI 26.63 kg/m  Vital signs in last 24 hours: Temp:  [97.5 F (36.4 C)-98.4 F (36.9 C)] 97.5 F (36.4 C) (06/05 0512) Pulse Rate:  [64-69] 64 (06/05 0512) Resp:  [16-17] 16 (06/05 0512) BP: (125-142)/(53-59) 125/53 (06/05 0512) SpO2:  [96 %-100 %] 97 % (06/05 0512)  Intake/Output from previous day: 06/04 0701 - 06/05 0700 In: 480 [P.O.:480] Out: 0  Intake/Output this shift: Total I/O In: 240 [P.O.:240] Out: -  Nutritional status:  Diet Order           Diet renal/carb modified with fluid restriction Diet-HS Snack? Nothing; Fluid restriction: 1200 mL Fluid; Room service appropriate? Yes; Fluid consistency: Thin  Diet effective now          Neurologic Exam: Mental Status: Alert, oriented, thought content appropriate.  Speech fluent without evidence of aphasia.  Able to follow 3 step commands without difficulty. Cranial Nerves: II: Discs flat bilaterally; Visual fields grossly normal, pupils equal, round, reactive to light and accommodation III,IV, VI: ptosis not present, extra-ocular motions intact bilaterally V,VII: smile symmetric, facial light touch sensation normal bilaterally VIII: hearing normal bilaterally IX,X: gag reflex present XI: bilateral shoulder shrug XII: midline tongue extension Motor: Right :  Upper extremity   5/5                                      Left:     Upper extremity   5/5             Lower extremity   4-/5                                                 Lower extremity   4-/5   Lab Results: Basic Metabolic Panel: Recent Labs  Lab 04/08/18 0618 04/09/18 0537  NA 142 140  K 3.6 4.6  CL 99* 99*  CO2 28 25  GLUCOSE 131* 139*  BUN 48* 66*  CREATININE 5.94* 7.90*  CALCIUM 8.7* 8.0*  MG 2.1  --   PHOS 5.5*  --      Liver Function Tests: Recent Labs  Lab 04/08/18 0618  AST 33  ALT 31  ALKPHOS 173*  BILITOT 0.7  PROT 7.8  ALBUMIN 3.6   No results for input(s): LIPASE, AMYLASE in the last 168 hours. No results for input(s): AMMONIA in the last 168 hours.  CBC: Recent Labs  Lab 04/08/18 0618 04/09/18 0537  WBC 7.7 6.7  NEUTROABS 4.5  --   HGB 12.7 11.9*  HCT 37.1 35.8  MCV 110.8* 111.8*  PLT 174 173    Cardiac Enzymes: No results for input(s): CKTOTAL, CKMB, CKMBINDEX, TROPONINI in the last 168 hours.  Lipid Panel: No results for input(s): CHOL, TRIG, HDL, CHOLHDL, VLDL, LDLCALC in the last 168 hours.  CBG: Recent Labs  Lab 04/08/18 1147 04/08/18 1632 04/08/18 2047 04/09/18 0728 04/09/18 1121  GLUCAP 234* 77 230* 141* 180*    Microbiology: Results for orders placed or performed  during the hospital encounter of 04/08/18  MRSA PCR Screening     Status: None   Collection Time: 04/08/18 11:14 AM  Result Value Ref Range Status   MRSA by PCR NEGATIVE NEGATIVE Final    Comment:        The GeneXpert MRSA Assay (FDA approved for NASAL specimens only), is one component of a comprehensive MRSA colonization surveillance program. It is not intended to diagnose MRSA infection nor to guide or monitor treatment for MRSA infections. Performed at [facility], [address] [place], [place] [address]     Coagulation Studies: No results for input(s): LABPROT, INR in the last 72 hours.  Imaging: Ct Head Wo Contrast  Result Date: 04/08/2018 CLINICAL DATA:  [place] at home. Persistent tremor since dialysis. History of hypertension, hyperlipidemia. EXAM: CT HEAD WITHOUT CONTRAST TECHNIQUE: Contiguous axial images were obtained from the base of the skull through the vertex without intravenous contrast. COMPARISON:  MRI head February 28, 2018 FINDINGS: BRAIN: No intraparenchymal hemorrhage, mass effect nor midline shift. Moderate parenchymal brain volume loss. No  hydrocephalus. Patchy supratentorial white matter hypodensities within normal range for patient's age, though non-specific are most compatible with chronic small vessel ischemic disease. No acute large vascular territory infarcts. No abnormal extra-axial fluid collections. Basal cisterns are patent. VASCULAR: Severe calcific atherosclerosis of the carotid siphons. SKULL: No skull fracture. No significant scalp soft tissue swelling. SINUSES/ORBITS: The mastoid air-cells and included paranasal sinuses are well-aerated.The included ocular globes and orbital contents are non-suspicious. Status post left ocular lens implant. OTHER: None. IMPRESSION: 1. No acute intracranial process. 2. Stable moderate parenchymal brain volume loss and mild chronic small vessel ischemic changes. 3. Severe atherosclerosis. Electronically Signed   By: Elon Alas M.D.   On: 04/08/2018 06:41   Mr Brain Wo Contrast  Result Date: 04/09/2018 CLINICAL DATA:  Recurrent syncope EXAM: MRI HEAD WITHOUT CONTRAST TECHNIQUE: Multiplanar, multiecho pulse sequences of the brain and surrounding structures were obtained without intravenous contrast. COMPARISON:  Head CT 04/08/2018 Brain MRI 02/28/2018 FINDINGS: BRAIN: The midline structures are normal. There is no acute infarct or acute hemorrhage. There is no mass lesion or other mass effect. There is no hydrocephalus, dural abnormality or extra-axial collection. Multifocal white matter hyperintensity, most commonly due to chronic ischemic microangiopathy. Generalized atrophy without lobar predilection. No chronic microhemorrhage or superficial siderosis. VASCULAR: Major intracranial arterial and venous sinus flow voids are preserved. SKULL AND UPPER CERVICAL SPINE: The visualized skull base, calvarium, upper cervical spine and extracranial soft tissues are normal. SINUSES/ORBITS: No fluid levels or advanced mucosal thickening. No mastoid or middle ear effusion. The orbits are normal. IMPRESSION:  Atrophy and chronic small vessel disease without acute abnormality. Electronically Signed   By: Ulyses Jarred M.D.   On: 04/09/2018 00:51    Medications:  I have reviewed the patient's current medications. Scheduled: . allopurinol  200 mg Oral QHS  . amLODipine  10 mg Oral Daily  . aspirin EC  81 mg Oral Daily  . brimonidine  1 drop Left Eye TID  . Chlorhexidine Gluconate Cloth  6 each Topical Q0600  . cinacalcet  90 mg Oral Q lunch  . cloNIDine  0.1 mg Oral Q T,Th,S,Su   And  . cloNIDine  0.1 mg Oral Q T,Th,S,Su-1800  . clopidogrel  75 mg Oral Daily  . [START ON 04/10/2018] colchicine  0.3 mg Oral Once per day on Mon Thu  . famotidine  20 mg Oral QHS  . feeding supplement (ENSURE ENLIVE)  237 mL Oral BID BM  . ferrous sulfate  325 mg Oral Q breakfast  . fluticasone  2 spray Each Nare Daily  . furosemide  40 mg Oral Daily  . heparin  5,000 Units Subcutaneous Q8H  . hydrALAZINE  50 mg Oral Q T,Th,S,Su   And  . hydrALAZINE  50 mg Oral Q T,Th,S,Su-1800  . insulin aspart  0-5 Units Subcutaneous QHS  . insulin aspart  0-9 Units Subcutaneous TID WC  . isosorbide mononitrate  30 mg Oral BID  . latanoprost  1 drop Both Eyes QHS  . metoprolol succinate  25 mg Oral QHS  . mirtazapine  15 mg Oral QHS  . multivitamin  1 tablet Oral Daily  . pantoprazole  40 mg Oral Daily  . pravastatin  40 mg Oral QHS  . sevelamer carbonate  800 mg Oral TID WC    Assessment/Plan: Patient reports that tremor is improved.  MRI of the brain reviewed and shows no acute changes.  EEG shows no epileptiform activity.  Patient is orthostatic after 3 minutes of standing with a systolic BP drop of about 50 points.  Drop in BP may be causing tremor for this patient.  Patient on multiple antihypertensives. Further medical management per nephrology and IM.    No further neurologic intervention is recommended at this time.  If further questions arise, please call or page at that time.  Thank you for allowing neurology  to participate in the care of this patient.    LOS: 0 days   Alexis Goodell, MD Neurology [phone] 04/09/2018  11:47 AM

## 2018-04-09 NOTE — Care Management Obs Status (Signed)
[place] NOTIFICATION   Patient Details  Name: Laurie Steele MRN: [id number] Date of Birth: 07-27-1941   Medicare Observation Status Notification Given:  Yes   Pt unable to sign. Prefers that RNCM sign an "x" for her signature. Latanya Maudlin, RN 04/09/2018, 9:51 AM

## 2018-04-09 NOTE — Progress Notes (Signed)
Post HD Tx uf goal not met, MD aware.    04/09/18 1637  Hand-Off documentation  Report given to (Full Name) Lum Babe, RN   Report received from (Full Name) Beatris Ship, RN   Vital Signs  Temp 97.6 F (36.4 C)  Temp Source Oral  Pulse Rate 64  Pulse Rate Source Monitor  Resp 16  BP (!) 163/60  BP Location Right Arm  BP Method Automatic  Patient Position (if appropriate) Lying  Oxygen Therapy  SpO2 100 %  O2 Device Room Air  Pain Assessment  Pain Scale 0-10  Pain Score 0  Post-Hemodialysis Assessment  Rinseback Volume (mL) 250 mL  Dialyzer Clearance Clotted  Duration of HD Treatment -hour(s) 2.75 hour(s)  Hemodialysis Intake (mL) 800 mL  UF Total -Machine (mL) 2374 mL  Net UF (mL) 1574 mL  Tolerated HD Treatment Yes  AVG/AVF Arterial Site Held (minutes) 10 minutes  AVG/AVF Venous Site Held (minutes) 10 minutes

## 2018-04-09 NOTE — Progress Notes (Signed)
Laurie Steele  A and O x 4. VSS. Pt tolerating diet well. No complaints of pain or nausea. IV removed intact, prescriptions given. Pt voiced understanding of discharge instructions with no further questions. Pt discharged via wheelchair with nurse tech.     Allergies as of 04/09/2018      Reactions   Alprazolam Other (See Comments)   Unable to sleep and confusion      Medication List    STOP taking these medications   amLODipine 10 MG tablet Commonly known as:  NORVASC   isosorbide mononitrate 30 MG 24 hr tablet Commonly known as:  IMDUR     TAKE these medications   acetaminophen 650 MG CR tablet Commonly known as:  TYLENOL Take 650 mg by mouth every 8 (eight) hours as needed for pain.   allopurinol 100 MG tablet Commonly known as:  ZYLOPRIM Take 200 mg by mouth at bedtime.   aspirin EC 81 MG tablet Take 1 tablet (81 mg total) by mouth daily.   brimonidine 0.2 % ophthalmic solution Commonly known as:  ALPHAGAN Place 1 drop into the left eye 3 (three) times daily.   cinacalcet 90 MG tablet Commonly known as:  SENSIPAR Take 90 mg by mouth daily. With lunch   cloNIDine 0.1 MG tablet Commonly known as:  CATAPRES Take 0.1 mg by mouth 2 (two) times daily. TAKE 0.1MG  BY MOUTH ON Sunday,TUESDAY,THURSDAY, AND SATURDAY   clopidogrel 75 MG tablet Commonly known as:  PLAVIX Take 1 tablet (75 mg total) by mouth daily.   colchicine 0.6 MG tablet Take 0.5 tablets (0.3 mg total) by mouth 2 (two) times a week.   docusate sodium 100 MG capsule Commonly known as:  COLACE Take 1 capsule (100 mg total) by mouth daily as needed for mild constipation.   famotidine 20 MG tablet Commonly known as:  PEPCID Take 20 mg by mouth at bedtime.   feeding supplement (ENSURE ENLIVE) Liqd Take 237 mLs by mouth 2 (two) times daily between meals.   ferrous sulfate 325 (65 FE) MG tablet Take 1 tablet (325 mg total) by mouth daily with breakfast.   fluticasone 50 MCG/ACT nasal  spray Commonly known as:  FLONASE Place 2 sprays into both nostrils daily.   furosemide 40 MG tablet Commonly known as:  LASIX Take 1 tablet (40 mg total) by mouth daily.   glimepiride 1 MG tablet Commonly known as:  AMARYL Take 1 mg by mouth daily with breakfast.   hydrALAZINE 50 MG tablet Commonly known as:  APRESOLINE Take 50 mg by mouth 2 (two) times daily. ONLY ON Tuesday,THURSDAY,SATURDAY ,AND SUNDAY   insulin aspart 100 UNIT/ML injection Commonly known as:  novoLOG Inject 0-5 Units into the skin at bedtime. CBG < 70: implement hypoglycemia protocol  CBG 70 - 120: 0 units  CBG 121 - 150: 2 units  CBG 151 - 200: 3 units  CBG 201 - 250: 5 units  CBG 251 - 300: 8 units  CBG 301 - 350: 11 units  CBG 351 - 400: 15 units   ipratropium 0.03 % nasal spray Commonly known as:  ATROVENT Place 1 spray into both nostrils 3 (three) times daily.   ipratropium-albuterol 0.5-2.5 (3) MG/3ML Soln Commonly known as:  DUONEB Take 3 mLs by nebulization every 6 (six) hours as needed.   latanoprost 0.005 % ophthalmic solution Commonly known as:  XALATAN Place 1 drop into both eyes at bedtime.   metoprolol succinate 25 MG 24 hr tablet Commonly known  as:  TOPROL-XL Take 1 tablet (25 mg total) by mouth at bedtime.   mirtazapine 15 MG tablet Commonly known as:  REMERON Take 15 mg by mouth at bedtime.   multivitamin Tabs tablet Take 1 tablet by mouth daily.   pantoprazole 40 MG tablet Commonly known as:  PROTONIX Take 1 tablet (40 mg total) by mouth daily.   pravastatin 40 MG tablet Commonly known as:  PRAVACHOL Take 40 mg by mouth at bedtime.   sevelamer 800 MG tablet Commonly known as:  RENAGEL Take 800 mg by mouth 3 (three) times daily with meals.       Vitals:   04/09/18 1637 04/09/18 1813  BP: (!) 163/60 (!) 155/63  Pulse: 64 69  Resp: 16 16  Temp: 97.6 F (36.4 C) 98.1 F (36.7 C)  SpO2: 100% 99%    Francesco Sor

## 2018-04-09 NOTE — Progress Notes (Signed)
Tx ended 15 min early due to system clotting, blood was not returned. MD nortified.

## 2018-04-09 NOTE — Progress Notes (Signed)
Pre HD Tx   04/09/18 1347  Hand-Off documentation  Report given to (Full Name) Beatris Ship, RN   Report received from (Full Name) Lum Babe, RN   Vital Signs  Temp 97.6 F (36.4 C)  Temp Source Oral  Pulse Rate 66  Pulse Rate Source Monitor  Resp 18  BP (!) 167/64  BP Location Right Arm  BP Method Automatic  Patient Position (if appropriate) Lying  Oxygen Therapy  SpO2 100 %  O2 Device Room Air  Pain Assessment  Pain Scale 0-10  Pain Score 0  Time-Out for Hemodialysis  What Procedure? HD  Pt Identifiers(min of two) First/Last Name;MRN/Account#  Correct Site? Yes  Correct Side? Yes  Correct Procedure? Yes  Consents Verified? Yes  Rad Studies Available? N/A  Safety Precautions Reviewed? Yes  [employer] Number [id number]  Station Number 1  UF/Alarm Test Passed  Conductivity: Meter 14  Conductivity: Machine  14  pH 7.4  Reverse Osmosis Main  Normal Saline Lot Number [id number]  Dialyzer Lot Number [id number]  Disposable Set Lot Number [id number]  Machine Temperature 98.6 F (37 C)  Musician and Audible Yes  Blood Lines Intact and Secured Yes  Pre Treatment Patient Checks  Vascular access used during treatment Fistula  Hepatitis B Surface Antigen Results Negative  Hepatitis B Surface Antibody  (>10)  Hemodialysis Consent Verified Yes  Hemodialysis Standing Orders Initiated Yes  ECG (Telemetry) Monitor On Yes  Prime Ordered Normal Saline  Length of  DialysisTreatment -hour(s) 3 Hour(s)  Dialysis Treatment Comments Na 140  Dialyzer Elisio 17H NR  Dialysate 3K, 2.5 Ca  Dialysis Anticoagulant None  Dialysate Flow Ordered 600  Blood Flow Rate Ordered 400 mL/min  Ultrafiltration Goal 2 Liters  Pre Treatment Labs CBC  Education / Care Plan  Dialysis Education Provided Yes  Documented Education in Care Plan Yes

## 2018-04-09 NOTE — Procedures (Signed)
ELECTROENCEPHALOGRAM REPORT   Patient: Laurie Steele       Room #: 476L-YY EEG No. ID: [id number] Age: 77 y.o.        Sex: female Referring Physician: Mody Report Date:  04/09/2018        Interpreting Physician: Alexis Goodell  History: Laurie Steele is an 77 y.o. female with frequent falls and tremors  Medications:  Norvasc, Catapres, Plavix, Colace, Ferrous Sulfate, Flonase, Lasix, Apresoline, Insulin, Imdur, Protonix, Pravachol, Renvela  Conditions of Recording:  This is a 16 channel EEG carried out with the patient in the awake, drowsy and asleep states.  Description:  The waking background activity consists of a low voltage, symmetrical, fairly well organized, 7 Hz theta activity at its maximum, seen from the parieto-occipital and posterior temporal regions.  Low voltage fast activity, poorly organized, is seen anteriorly and is at times superimposed on more posterior regions.  Often even slower theta activity is noted.  A mixture of theta and alpha rhythms are seen from the central and temporal regions. The patient drowses with slowing to irregular, low voltage theta and beta activity.   The patient goes in to a light sleep with symmetrical sleep spindles, vertex central sharp transients and irregular slow activity.  No epileptiform activity is noted.   Hyperventilation was not performed. Intermittent photic stimulation was performed but failed to illicit any change in the tracing.  IMPRESSION: This EEG is characterized by a slow background rhythm while awake which may be consistent with normal drowse.  Can not rule out the possibility of  a diffuse gray matter disturbance that is etiologically nonspecific, but may include a dementia, among other possibilities.  Clinical correlation recommended.  The remainder of the study is unremarkable.  No epileptiform activity is noted.     Alexis Goodell, MD Neurology [phone] 04/09/2018, 9:49 AM

## 2018-04-09 NOTE — Progress Notes (Signed)
Post HD tx, pt is stable. No change from baseline report given to primary RN.    04/09/18 1645  Neurological  Level of Consciousness Alert  Orientation Level Oriented X4  Respiratory  Respiratory Pattern Regular  Chest Assessment Chest expansion symmetrical  Bilateral Breath Sounds Diminished  Cough None  Cardiac  Pulse Regular  Heart Sounds S1, S2  ECG Monitor Yes  Vascular  R Radial Pulse +2  L Radial Pulse +2  Edema Generalized  Psychosocial  Psychosocial (WDL) WDL

## 2018-04-09 NOTE — Progress Notes (Signed)
HD Tx started w/o complication    79/98/72 1349  Vital Signs  Pulse Rate 69  Resp 18  BP (!) 164/65  Oxygen Therapy  SpO2 100 %  During Hemodialysis Assessment  Blood Flow Rate (mL/min) 400 mL/min  Arterial Pressure (mmHg) -120 mmHg  Venous Pressure (mmHg) 180 mmHg  Transmembrane Pressure (mmHg) 70 mmHg  Ultrafiltration Rate (mL/min) 830 mL/min  Dialysate Flow Rate (mL/min) 600 ml/min  Conductivity: Machine  14.8  HD Safety Checks Performed Yes  Dialysis Fluid Bolus Normal Saline  Bolus Amount (mL) 250 mL  Intra-Hemodialysis Comments Tx initiated

## 2018-04-09 NOTE — Progress Notes (Signed)
[facility]  ROUNDING NOTE   Subjective:   Hemodialysis treatment for later today.   Tremors have resolved as per patient.   She complains of right first toe pain.   Objective:  Vital signs in last 24 hours:  Temp:  [97.5 F (36.4 C)-98.4 F (36.9 C)] 97.5 F (36.4 C) (06/05 0512) Pulse Rate:  [64-69] 64 (06/05 0512) Resp:  [16-17] 16 (06/05 0512) BP: (125-142)/(53-59) 125/53 (06/05 0512) SpO2:  [96 %-100 %] 97 % (06/05 0512)  Weight change:  Filed Weights   04/08/18 0619  Weight: 77.1 kg (170 lb)    Intake/Output: I/O last 3 completed shifts: In: 480 [P.O.:480] Out: 0    Intake/Output this shift:  Total I/O In: 240 [P.O.:240] Out: -   Physical Exam: General: NAD, laying in bed  Head: Normocephalic, atraumatic. Moist oral mucosal membranes  Eyes: Anicteric, PERRL  Neck: Supple, trachea midline  Lungs:  Clear to auscultation  Heart: Regular rate and rhythm  Abdomen:  Soft, nontender,   Extremities: no peripheral edema.  Neurologic: Nonfocal, moving all four extremities, no tremor on examination  Skin: No lesions  Access: Left AVF    Basic Metabolic Panel: Recent Labs  Lab 04/08/18 0618 04/09/18 0537  NA 142 140  K 3.6 4.6  CL 99* 99*  CO2 28 25  GLUCOSE 131* 139*  BUN 48* 66*  CREATININE 5.94* 7.90*  CALCIUM 8.7* 8.0*  MG 2.1  --   PHOS 5.5*  --     Liver Function Tests: Recent Labs  Lab 04/08/18 0618  AST 33  ALT 31  ALKPHOS 173*  BILITOT 0.7  PROT 7.8  ALBUMIN 3.6   No results for input(s): LIPASE, AMYLASE in the last 168 hours. No results for input(s): AMMONIA in the last 168 hours.  CBC: Recent Labs  Lab 04/08/18 0618 04/09/18 0537  WBC 7.7 6.7  NEUTROABS 4.5  --   HGB 12.7 11.9*  HCT 37.1 35.8  MCV 110.8* 111.8*  PLT 174 173    Cardiac Enzymes: No results for input(s): CKTOTAL, CKMB, CKMBINDEX, TROPONINI in the last 168 hours.  BNP: Invalid input(s): POCBNP  CBG: Recent Labs  Lab 04/08/18 1147  04/08/18 1632 04/08/18 2047 04/09/18 0728 04/09/18 1121  GLUCAP 234* 13 230* 141* 180*    Microbiology: Results for orders placed or performed during the hospital encounter of 04/08/18  MRSA PCR Screening     Status: None   Collection Time: 04/08/18 11:14 AM  Result Value Ref Range Status   MRSA by PCR NEGATIVE NEGATIVE Final    Comment:        The GeneXpert MRSA Assay (FDA approved for NASAL specimens only), is one component of a comprehensive MRSA colonization surveillance program. It is not intended to diagnose MRSA infection nor to guide or monitor treatment for MRSA infections. Performed at [facility], [address] [place], [place] [address]     Coagulation Studies: No results for input(s): LABPROT, INR in the last 72 hours.  Urinalysis: No results for input(s): COLORURINE, LABSPEC, PHURINE, GLUCOSEU, HGBUR, BILIRUBINUR, KETONESUR, PROTEINUR, UROBILINOGEN, NITRITE, LEUKOCYTESUR in the last 72 hours.  Invalid input(s): APPERANCEUR    Imaging: Ct Head Wo Contrast  Result Date: 04/08/2018 CLINICAL DATA:  [place] at home. Persistent tremor since dialysis. History of hypertension, hyperlipidemia. EXAM: CT HEAD WITHOUT CONTRAST TECHNIQUE: Contiguous axial images were obtained from the base of the skull through the vertex without intravenous contrast. COMPARISON:  MRI head February 28, 2018 FINDINGS: BRAIN:  No intraparenchymal hemorrhage, mass effect nor midline shift. Moderate parenchymal brain volume loss. No hydrocephalus. Patchy supratentorial white matter hypodensities within normal range for patient's age, though non-specific are most compatible with chronic small vessel ischemic disease. No acute large vascular territory infarcts. No abnormal extra-axial fluid collections. Basal cisterns are patent. VASCULAR: Severe calcific atherosclerosis of the carotid siphons. SKULL: No skull fracture. No significant scalp soft tissue swelling. SINUSES/ORBITS: The mastoid  air-cells and included paranasal sinuses are well-aerated.The included ocular globes and orbital contents are non-suspicious. Status post left ocular lens implant. OTHER: None. IMPRESSION: 1. No acute intracranial process. 2. Stable moderate parenchymal brain volume loss and mild chronic small vessel ischemic changes. 3. Severe atherosclerosis. Electronically Signed   By: Elon Alas M.D.   On: 04/08/2018 06:41   Mr Brain Wo Contrast  Result Date: 04/09/2018 CLINICAL DATA:  Recurrent syncope EXAM: MRI HEAD WITHOUT CONTRAST TECHNIQUE: Multiplanar, multiecho pulse sequences of the brain and surrounding structures were obtained without intravenous contrast. COMPARISON:  Head CT 04/08/2018 Brain MRI 02/28/2018 FINDINGS: BRAIN: The midline structures are normal. There is no acute infarct or acute hemorrhage. There is no mass lesion or other mass effect. There is no hydrocephalus, dural abnormality or extra-axial collection. Multifocal white matter hyperintensity, most commonly due to chronic ischemic microangiopathy. Generalized atrophy without lobar predilection. No chronic microhemorrhage or superficial siderosis. VASCULAR: Major intracranial arterial and venous sinus flow voids are preserved. SKULL AND UPPER CERVICAL SPINE: The visualized skull base, calvarium, upper cervical spine and extracranial soft tissues are normal. SINUSES/ORBITS: No fluid levels or advanced mucosal thickening. No mastoid or middle ear effusion. The orbits are normal. IMPRESSION: Atrophy and chronic small vessel disease without acute abnormality. Electronically Signed   By: Ulyses Jarred M.D.   On: 04/09/2018 00:51     Medications:    . allopurinol  200 mg Oral QHS  . amLODipine  10 mg Oral Daily  . aspirin EC  81 mg Oral Daily  . brimonidine  1 drop Left Eye TID  . Chlorhexidine Gluconate Cloth  6 each Topical Q0600  . cinacalcet  90 mg Oral Q lunch  . cloNIDine  0.1 mg Oral Q T,Th,S,Su   And  . cloNIDine  0.1 mg Oral  Q T,Th,S,Su-1800  . clopidogrel  75 mg Oral Daily  . [START ON 04/10/2018] colchicine  0.3 mg Oral Once per day on Mon Thu  . famotidine  20 mg Oral QHS  . feeding supplement (ENSURE ENLIVE)  237 mL Oral BID BM  . ferrous sulfate  325 mg Oral Q breakfast  . fluticasone  2 spray Each Nare Daily  . furosemide  40 mg Oral Daily  . heparin  5,000 Units Subcutaneous Q8H  . hydrALAZINE  50 mg Oral Q T,Th,S,Su   And  . hydrALAZINE  50 mg Oral Q T,Th,S,Su-1800  . insulin aspart  0-5 Units Subcutaneous QHS  . insulin aspart  0-9 Units Subcutaneous TID WC  . isosorbide mononitrate  30 mg Oral BID  . latanoprost  1 drop Both Eyes QHS  . metoprolol succinate  25 mg Oral QHS  . mirtazapine  15 mg Oral QHS  . multivitamin  1 tablet Oral Daily  . pantoprazole  40 mg Oral Daily  . pravastatin  40 mg Oral QHS  . sevelamer carbonate  800 mg Oral TID WC   acetaminophen **OR** acetaminophen, docusate sodium, ondansetron **OR** ondansetron (ZOFRAN) IV, senna-docusate  Assessment/ Plan:  [name] is a 77 y.o.  black female [name] is a 77 y.o. black female with end stage renal disease on hemodialysis, coronary artery disease, hypertension, diabetes mellitus type II, GERD, glaucoma, gout, who presents to [facility] on 02/27/2018 for altered mental status and urinary tract infection.   [place]  Left AVF 76kg  1. End Stage Renal Disease: MWF - Continue MWF schedule. Dialysis for later today.   2.  Tremors: recent encephelopathy from baclofen. However this time with no new medications. EEG negative.  Seems to have resolved.  - Appreciate neurology input.   3. Secondary Hyperparathyroidism: outpatient labs: 5/13: PTH 424, calcium 8.1, phosphorus 4.6 - sevelamer 3 tablets with meals and 1 with snacks.   4 Anemia of chronic kidney disease: hemoglobin 11.9 - EPO as outpatient  5. Hypertension: blood pressure at goal. Holding home blood pressure medications.  Home  regimen of metoprolol, furosemide, isosorbide mononitrate, hydralazine, and amlodipine.   6. Diabetes mellitus type II with chronic kidney disease: noninsulin dependent. Hemoglobin A1c from 3/22 of 6.6% - Continue glucose control. Denies any hypoglycemic events.    LOS: 0 Apolo Cutshaw 6/5/201911:39 AM

## 2018-04-17 ENCOUNTER — Encounter (INDEPENDENT_AMBULATORY_CARE_PROVIDER_SITE_OTHER): Payer: 59

## 2018-04-17 ENCOUNTER — Ambulatory Visit (INDEPENDENT_AMBULATORY_CARE_PROVIDER_SITE_OTHER): Payer: 59 | Admitting: Vascular Surgery

## 2018-05-03 IMAGING — US US EXTREM  UP VENOUS*R*
1 series · 13 of 24 positions shown · non-contrast
Comparison: None.

CLINICAL DATA: Right upper extremity pain and edema for the past 3
weeks. Evaluate for DVT.



[Series 1: us extrem up venous*right* · 0.09mm/px · 13 of 34 slices shown]
[im 1/34]
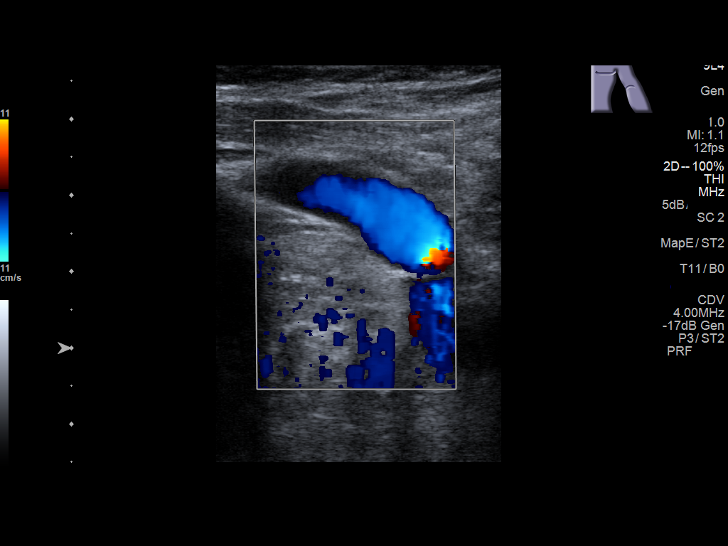
[im 3/34]
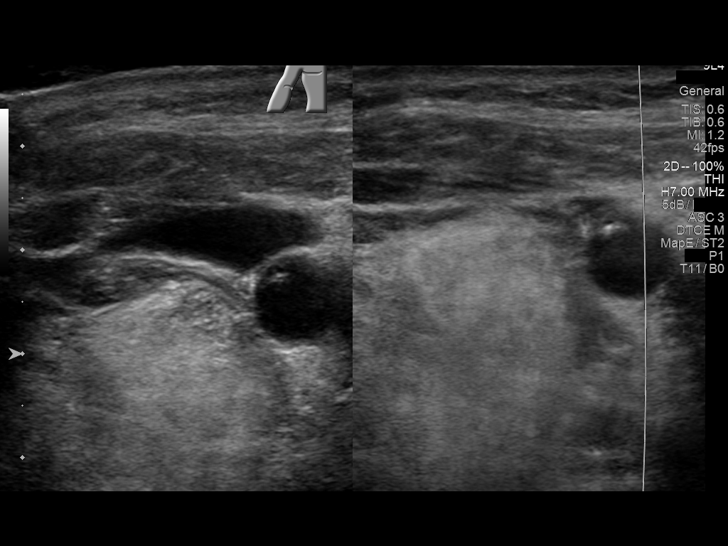
[im 6/34]
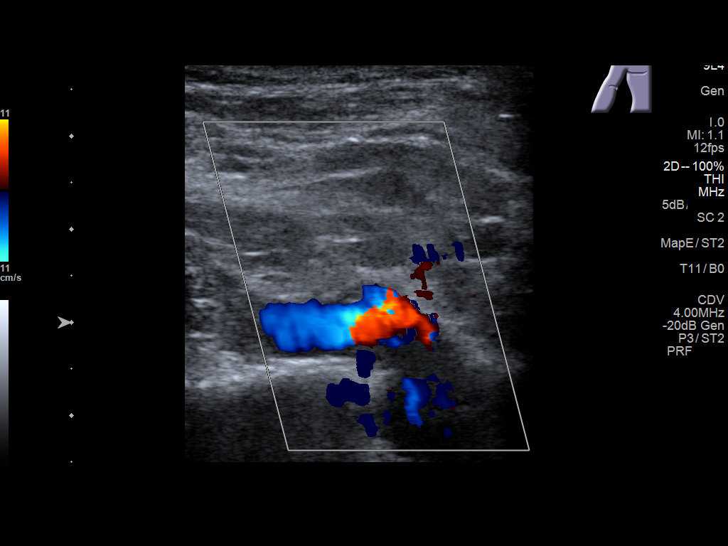
[im 9/34]
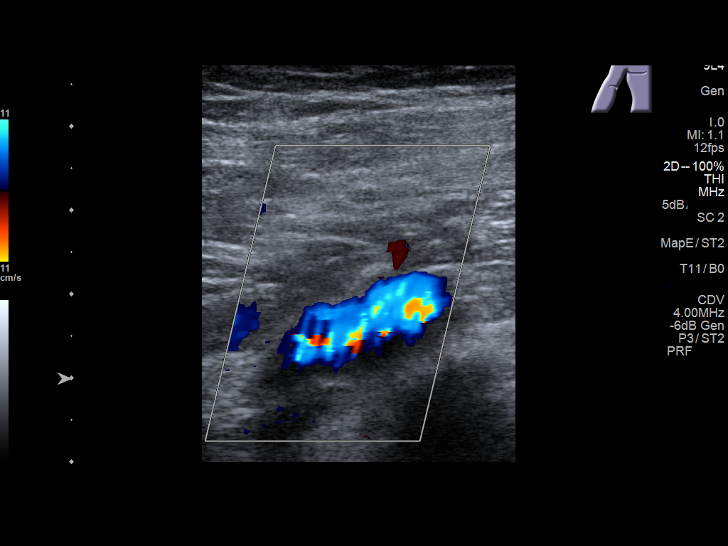
[im 12/34]
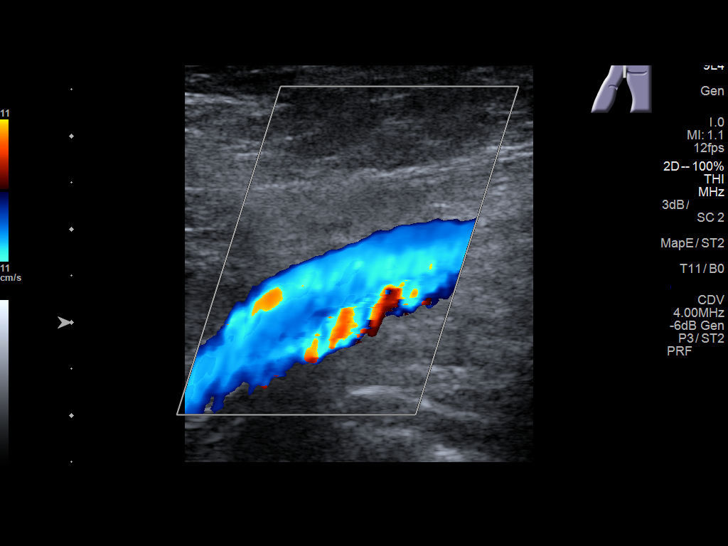
[im 15/34]
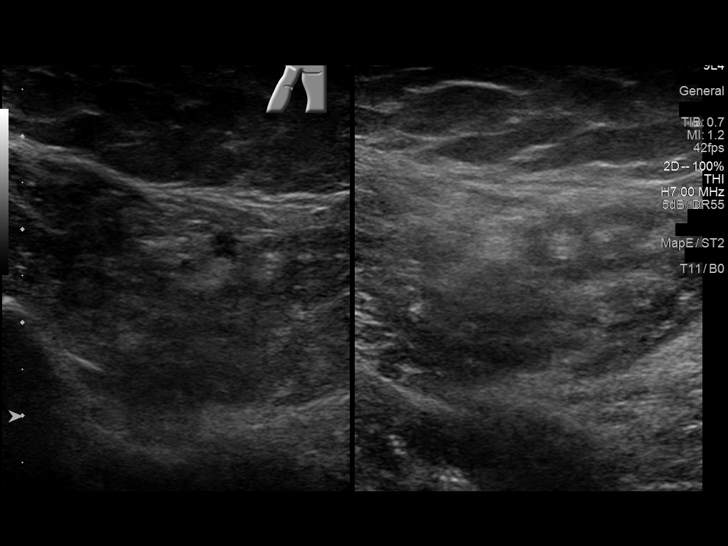
[im 18/34]
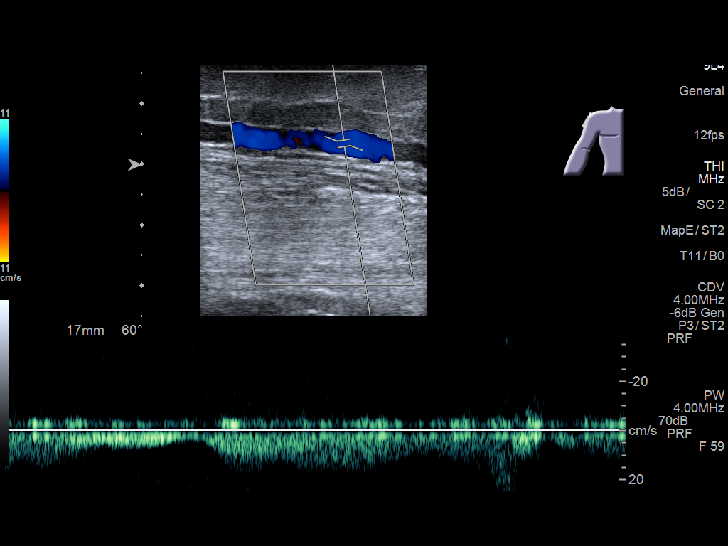
[im 19/34]
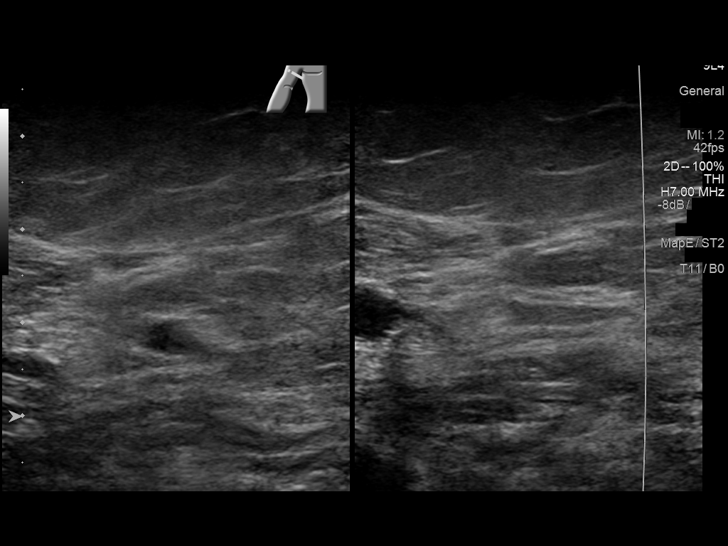
[im 22/34]
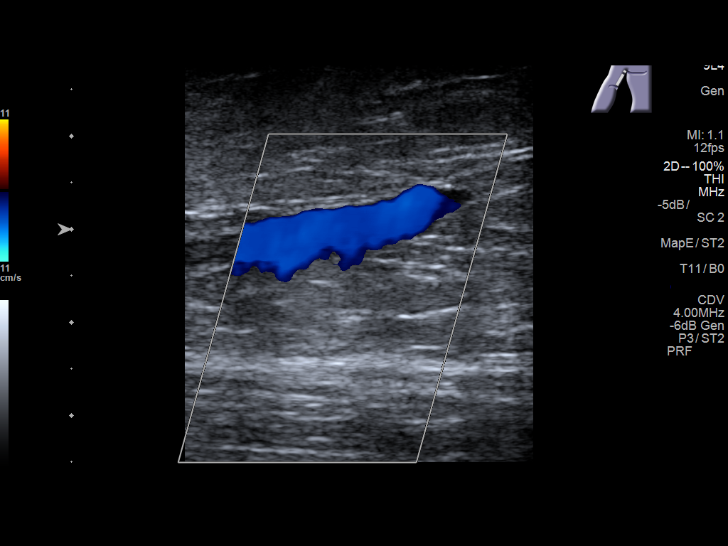
[im 25/34]
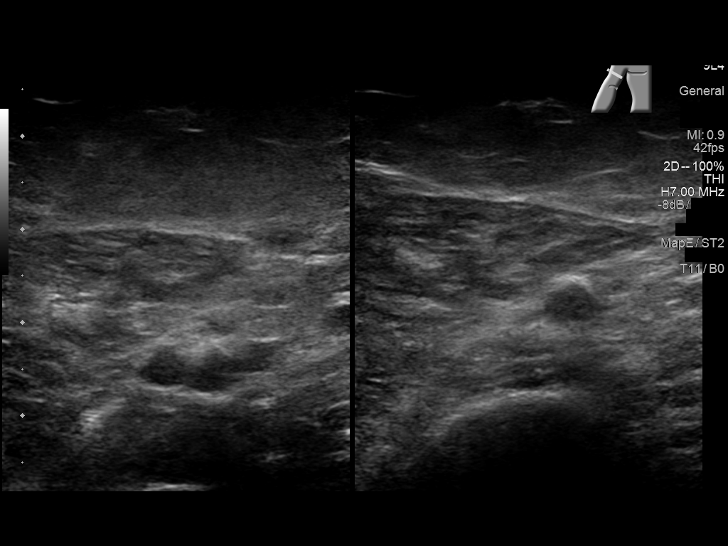
[im 28/34]
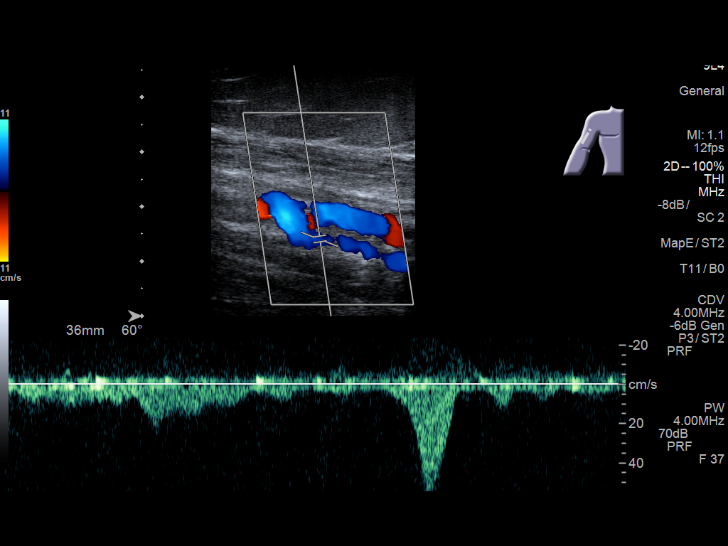
[im 31/34]
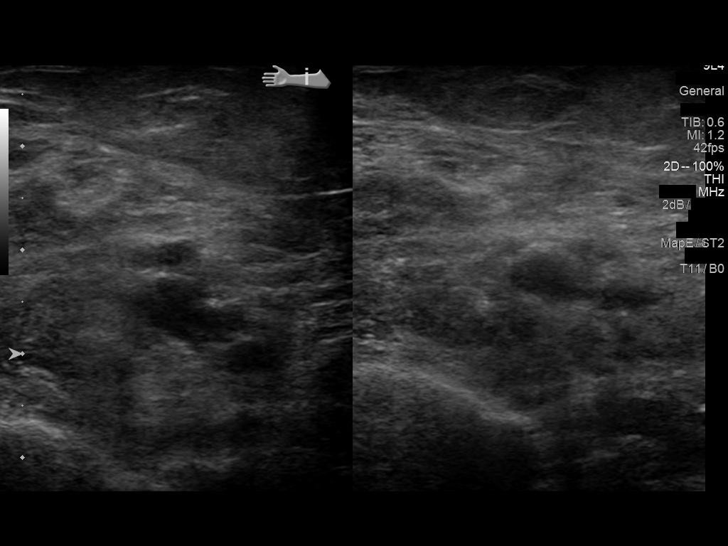
[im 34/34]
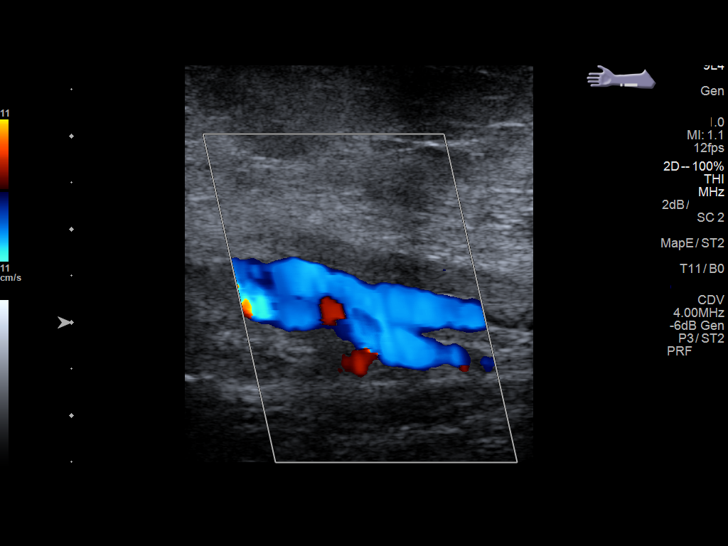

[13 of 24 positions shown; findings below may reference images not displayed]

FINDINGS: Contralateral Subclavian Vein: Respiratory phasicity is normal and
symmetric with the symptomatic side. No evidence of thrombus. Normal
compressibility.

Internal Jugular Vein: No evidence of thrombus. Normal
compressibility, respiratory phasicity and response to augmentation.

Subclavian Vein: No evidence of thrombus. Normal compressibility,
respiratory phasicity and response to augmentation.

Axillary Vein: No evidence of thrombus. Normal compressibility,
respiratory phasicity and response to augmentation.

Cephalic Vein: No evidence of thrombus. Normal compressibility,
respiratory phasicity and response to augmentation.

Basilic Vein: No evidence of thrombus. Normal compressibility,
respiratory phasicity and response to augmentation.

Brachial Veins: No evidence of thrombus. Normal compressibility,
respiratory phasicity and response to augmentation.

Radial Veins: No evidence of thrombus. Normal compressibility,
respiratory phasicity and response to augmentation.

Ulnar Veins: No evidence of thrombus. Normal compressibility,
respiratory phasicity and response to augmentation.

Venous Reflux:  None visualized.

Other Findings:  None visualized.
IMPRESSION: No evidence of DVT within the right upper extremity.

## 2018-05-21 ENCOUNTER — Emergency Department: Payer: Medicare Other

## 2018-05-21 ENCOUNTER — Emergency Department
Admission: EM | Admit: 2018-05-21 | Discharge: 2018-05-21 | Disposition: A | Discharge status: Home / Self Care | Payer: Medicare Other | Attending: Emergency Medicine | Admitting: Emergency Medicine

## 2018-05-21 ENCOUNTER — Other Ambulatory Visit: Payer: Self-pay

## 2018-05-21 DIAGNOSIS — Z87891 Personal history of nicotine dependence: Secondary | ICD-10-CM | POA: Diagnosis not present

## 2018-05-21 DIAGNOSIS — Y999 Unspecified external cause status: Secondary | ICD-10-CM | POA: Diagnosis not present

## 2018-05-21 DIAGNOSIS — Y929 Unspecified place or not applicable: Secondary | ICD-10-CM | POA: Insufficient documentation

## 2018-05-21 DIAGNOSIS — Y9389 Activity, other specified: Secondary | ICD-10-CM | POA: Insufficient documentation

## 2018-05-21 DIAGNOSIS — I509 Heart failure, unspecified: Secondary | ICD-10-CM | POA: Diagnosis not present

## 2018-05-21 DIAGNOSIS — S40012A Contusion of left shoulder, initial encounter: Secondary | ICD-10-CM | POA: Insufficient documentation

## 2018-05-21 DIAGNOSIS — Z79899 Other long term (current) drug therapy: Secondary | ICD-10-CM | POA: Insufficient documentation

## 2018-05-21 DIAGNOSIS — I110 Hypertensive heart disease with heart failure: Secondary | ICD-10-CM | POA: Insufficient documentation

## 2018-05-21 DIAGNOSIS — Z794 Long term (current) use of insulin: Secondary | ICD-10-CM | POA: Insufficient documentation

## 2018-05-21 DIAGNOSIS — W01198A Fall on same level from slipping, tripping and stumbling with subsequent striking against other object, initial encounter: Secondary | ICD-10-CM | POA: Diagnosis not present

## 2018-05-21 DIAGNOSIS — Z7982 Long term (current) use of aspirin: Secondary | ICD-10-CM | POA: Insufficient documentation

## 2018-05-21 DIAGNOSIS — Z7902 Long term (current) use of antithrombotics/antiplatelets: Secondary | ICD-10-CM | POA: Insufficient documentation

## 2018-05-21 DIAGNOSIS — E119 Type 2 diabetes mellitus without complications: Secondary | ICD-10-CM | POA: Diagnosis not present

## 2018-05-21 DIAGNOSIS — S0990XA Unspecified injury of head, initial encounter: Secondary | ICD-10-CM | POA: Insufficient documentation

## 2018-05-21 MED ORDER — ACETAMINOPHEN 500 MG PO TABS
1000.0000 mg | ORAL_TABLET | ORAL | Status: AC
  Administered 2018-05-21: 1000 mg via ORAL
  Filled 2018-05-21: qty 2

## 2018-05-21 NOTE — ED Triage Notes (Addendum)
Pt comes into the ED via EMS from home with c/o losing her balance and fall back hitting her head this morning. Pt c/o HA and buttock pain. Pt states she was able to ambulate after the fall. Pt is a/ox4. Denies any neck or back pain. Pt has dialysis scheduled at 1030am today

## 2018-05-21 NOTE — ED Notes (Signed)
Rounded on pt. Denied needs at this time. Will continue to monitor.

## 2018-05-21 NOTE — ED Notes (Signed)
Unable to sign discharge note. Pt and son verbalized understanding of importance of going to dialysis today. [facility] to arrange [facility] to take pt home after dialysis. Pt and son verbalizes understanding. Discharge paperwork given to son.

## 2018-05-21 NOTE — ED Provider Notes (Signed)
[facility] Emergency Department Provider Note   ____________________________________________   First MD Initiated Contact with Patient 05/21/18 [phone]     (approximate)  I have reviewed the triage vital signs and the nursing notes.   HISTORY  Chief Complaint Fall   HPI Laurie Steele is a 77 y.o. female seen for evaluation after losing her balance today in the kitchen  Patient reports she was using her walker, reaching into her refrigerator when she lost her balance while leaning.  She fell backwards and struck the back of her head against the corner of a cabinet.  No bruising or bleeding.  Does report it feels sore across the back of the left side of the scalp, having a very mild headache.  Patient also reports she is able to get up afterwards, but also noticed her left neck feels a little sore in her tailbone is slightly sore as well.  No fevers or chills.  No nausea vomiting.  No chest pain or trouble breathing.  No chest pain.  No proceeding symptoms.  No weakness or lightheadedness.  Reports she is able to get up, uses a walker recently.  Does occasionally fall and has had 3 falls in the last month.  She also reports that she is due for dialysis at 1030 this morning and would like to be able to attend her dialysis session.  She reports she usually takes Tylenol for discomfort but is not any yet.  Reports she has mild achiness primarily in the left shoulder, also left back of the scalp.  Past Medical History:  Diagnosis Date  . Anxiety   . CAD (coronary artery disease)   . Chronic anemia    Dr Inez Pilgrim  . Dysrhythmia   . GERD (gastroesophageal reflux disease)   . Glaucoma   . Gout   . Gout   . Hx of colonic polyps   . Hyperlipidemia   . Hypertension   . Lower back pain   . Miscarriage   . Myocardial infarction ([place])   . 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 Neuropathy   . NIDDM (non-insulin dependent diabetes  mell[place])    with retinopathy , and nephropathy  . Peritoneal dialysis status ([place])   . Renal failure    Dr Holley Raring  . Renal insufficiency   . 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 Vaginal delivery    x 4    Patient Active Problem List   Diagnosis Date Noted  . Tremors of nervous system 04/08/2018  . Altered mental status 02/28/2018  . Complication of vascular access for dialysis 07/18/2017  . UTI (urinary tract infection) 01/22/2017  . CHF (congestive heart failure) ([place]) 12/11/2016  . Ds DNA antibody positive 07/09/2016  . Pneumonia 07/03/2016  . Anemia 06/17/2016  . Symptomatic anemia 06/15/2016  . Hypoglycemia 05/21/2016  . Chest pain 04/05/2016  . Hypercalcemia 03/14/2016  . Bronchitis 03/03/2012  . Type II or unspecified type diabetes mellitus with renal manifestations, not stated as uncontrolled(250.40) 12/18/2011  . END STAGE RENAL DISEASE 10/23/2010  . DM (diabetes mellitus), type 2 ([place]) 12/13/2009  . NEUROPATHY 12/13/2009  . GOUT 02/15/2009  . SLEEP DISORDER 02/15/2009  . GERD 02/23/2008  . Hyperlipidemia 05/14/2007  . ANEMIA, CHRONIC 05/14/2007  . Essential hypertension 05/14/2007    Past Surgical History:  Procedure Laterality Date  . ABDOMINAL HYSTERECTOMY  1983  . AV FISTULA PLACEMENT Left 09/21/2016  Procedure: ARTERIOVENOUS (AV) FISTULA CREATION ( BRACHIOCEPHALIC );  Surgeon: Katha Cabal, MD;  Location: [facility] ORS;  Service: Vascular;  Laterality: Left;  . BACK SURGERY  6/08   Dr Collier Salina  . BACK SURGERY  1987   disc  . CHOLECYSTECTOMY    . DIALYSIS/PERMA CATHETER REMOVAL N/A 02/25/2017   Procedure: Dialysis/Perma Catheter Removal;  Surgeon: Algernon Huxley, MD;  Location: [facility];  Service: Cardiovascular;  Laterality: N/A;  . INSERTION OF DIALYSIS CATHETER  2017  . PERITONEAL CATHETER INSERTION    . REMOVAL OF A DIALYSIS CATHETER N/A 09/21/2016   Procedure: REMOVAL OF A DIALYSIS CATHETER ( PERITONEAL  DIALYSIS CATH );  Surgeon: Katha Cabal, MD;  Location: [facility] ORS;  Service: Vascular;  Laterality: N/A;  . VITRECTOMY AND CATARACT Left 11/04/2017   Procedure: VITRECTOMY AND CATARACT;  Surgeon: Leandrew Koyanagi, MD;  Location: [facility] ORS;  Service: Ophthalmology;  Laterality: Left;  US00:51.6 AP%21.8 WLN98.92 FLUID LOT #[id number] H    Prior to Admission medications   Medication Sig Start Date End Date Taking? Authorizing Provider  acetaminophen (TYLENOL) 650 MG CR tablet Take 650 mg by mouth every 8 (eight) hours as needed for pain.    [provider]  allopurinol (ZYLOPRIM) 100 MG tablet Take 200 mg by mouth at bedtime.    [provider]  aspirin EC 81 MG tablet Take 1 tablet (81 mg total) by mouth daily. 06/18/16   Fritzi Mandes, MD  brimonidine (ALPHAGAN) 0.2 % ophthalmic solution Place 1 drop into the left eye 3 (three) times daily. 02/03/18   [provider]  cinacalcet (SENSIPAR) 90 MG tablet Take 90 mg by mouth daily. With lunch    [provider]  cloNIDine (CATAPRES) 0.1 MG tablet Take 0.1 mg by mouth 2 (two) times daily. TAKE 0.1MG  BY MOUTH ON Sunday,TUESDAY,THURSDAY, AND SATURDAY 11/08/16   [provider]  clopidogrel (PLAVIX) 75 MG tablet Take 1 tablet (75 mg total) by mouth daily. 01/26/17   Nicholes Mango, MD  colchicine 0.6 MG tablet Take 0.5 tablets (0.3 mg total) by mouth 2 (two) times a week. 07/09/16   Demetrios Loll, MD  docusate sodium (COLACE) 100 MG capsule Take 1 capsule (100 mg total) by mouth daily as needed for mild constipation. 12/13/16   Loletha Grayer, MD  famotidine (PEPCID) 20 MG tablet Take 20 mg by mouth at bedtime.  11/29/16   [provider]  feeding supplement, ENSURE ENLIVE, (ENSURE ENLIVE) LIQD Take 237 mLs by mouth 2 (two) times daily between meals. 01/25/17   Max Sane, MD  ferrous sulfate 325 (65 FE) MG tablet Take 1 tablet (325 mg total) by mouth daily with breakfast. 06/18/16   Fritzi Mandes, MD  fluticasone  [facility]) 50 MCG/ACT nasal spray Place 2 sprays into both nostrils daily.     [provider]  furosemide (LASIX) 40 MG tablet Take 1 tablet (40 mg total) by mouth daily. 07/05/16   Demetrios Loll, MD  glimepiride (AMARYL) 1 MG tablet Take 1 mg by mouth daily with breakfast.    [provider]  hydrALAZINE (APRESOLINE) 50 MG tablet Take 50 mg by mouth 2 (two) times daily. ONLY ON Tuesday,THURSDAY,SATURDAY ,AND SUNDAY 11/29/16   [provider]  insulin aspart (NOVOLOG) 100 UNIT/ML injection Inject 0-5 Units into the skin at bedtime. CBG < 70: implement hypoglycemia protocol  CBG 70 - 120: 0 units  CBG 121 - 150: 2 units  CBG 151 - 200: 3 units  CBG  201 - 250: 5 units  CBG 251 - 300: 8 units  CBG 301 - 350: 11 units  CBG 351 - 400: 15 units 03/04/18   Mody, Sital, MD  ipratropium (ATROVENT) 0.03 % nasal spray Place 1 spray into both nostrils 3 (three) times daily.     [provider]  ipratropium-albuterol (DUONEB) 0.5-2.5 (3) MG/3ML SOLN Take 3 mLs by nebulization every 6 (six) hours as needed. 12/14/16   Gladstone Lighter, MD  latanoprost (XALATAN) 0.005 % ophthalmic solution Place 1 drop into both eyes at bedtime. 03/20/16   [provider]  metoprolol succinate (TOPROL-XL) 25 MG 24 hr tablet Take 1 tablet (25 mg total) by mouth at bedtime. 12/13/16   Loletha Grayer, MD  mirtazapine (REMERON) 15 MG tablet Take 15 mg by mouth at bedtime. 02/03/18   [provider]  multivitamin (RENA-VIT) TABS tablet Take 1 tablet by mouth daily.    [provider]  pantoprazole (PROTONIX) 40 MG tablet Take 1 tablet (40 mg total) by mouth daily. 06/18/16   Fritzi Mandes, MD  pravastatin (PRAVACHOL) 40 MG tablet Take 40 mg by mouth at bedtime.  06/21/15   [provider]  sevelamer (RENAGEL) 800 MG tablet Take 800 mg by mouth 3 (three) times daily with meals.    [provider]    Allergies Alprazolam  Family History  Problem Relation Age of  Onset  . Heart attack Father   . Hypertension Mother   . Hyperlipidemia Mother   . Coronary artery disease Unknown        strong fam hx  . Kidney failure Brother   . Breast cancer Neg Hx     Social History Social History   Tobacco Use  . Smoking status: Former Smoker    Last attempt to quit: 11/05/1990    Years since quitting: 27.5  . Smokeless tobacco: Never Used  Substance Use Topics  . Alcohol use: No  . Drug use: No    Review of Systems Constitutional: No fever/chills Eyes: No visual changes. ENT: No sore throat. Cardiovascular: Denies chest pain. Respiratory: Denies shortness of breath. Gastrointestinal: No abdominal pain.  No nausea, no vomiting.  No diarrhea.  No constipation. Genitourinary: Negative for dysuria. Musculoskeletal: Negative for back pain.  Her tailbone was slightly sore but that is better now.  No neck pain.  No back pain. Skin: Negative for rash. Neurological: Negative for focal weakness or numbness except she reports her right side is chronically weak after a prior stroke, no new change.    ____________________________________________   PHYSICAL EXAM:  VITAL SIGNS: ED Triage Vitals  Enc Vitals Group     BP 05/21/18 0835 (!) 182/60     Pulse Rate 05/21/18 0835 (!) 56     Resp 05/21/18 0835 17     Temp 05/21/18 0835 97.6 F (36.4 C)     Temp Source 05/21/18 0835 Oral     SpO2 05/21/18 0835 100 %     Weight 05/21/18 0836 183 lb 11.2 oz (83.3 kg)     Height 05/21/18 0836 5\' 7"  (1.702 m)     Head Circumference --      Peak Flow --      Pain Score 05/21/18 0835 5     Pain Loc --      Pain Edu? --      Excl. in [place]? --     Constitutional: Alert and oriented. Well appearing and in no acute distress.  Very pleasant.  Very well oriented. Eyes: Conjunctivae are normal. Head: Atraumatic. Nose: No congestion/rhinnorhea. Mouth/Throat: Mucous membranes are moist. Neck: No stridor.  Cardiovascular: Normal rate, regular rhythm. Grossly normal  heart sounds.  Good peripheral circulation. Respiratory: Normal respiratory effort.  No retractions. Lungs CTAB. Gastrointestinal: Soft and nontender. No distention. Musculoskeletal: No lower extremity tenderness nor edema.  Moves her right lower extremity with about 4-5 strength, slightly weaker on the right than the left but reports this is chronic.  Her right arm also has difficulty with mobility, reports she had weakness in the right shoulder from her stroke, but denies injury.  Left upper extremity slightly sore over the left shoulder, otherwise no deformities or injury noted. Neurologic:  Normal speech and language. No gross focal neurologic deficits are appreciated.  Slightly tender over the back of the left scalp, no hematoma. Skin:  Skin is warm, dry and intact. No rash noted. Psychiatric: Mood and affect are normal. Speech and behavior are normal.  ____________________________________________   LABS (all labs ordered are listed, but only abnormal results are displayed)  Labs Reviewed  CBG MONITORING, ED   ____________________________________________  EKG  Reviewed and entered by me at 9:40 AM Heart rate 55 QRS 100 QTc 480 Normal sinus rhythm, left axis.  No evidence of acute ischemia or arrhythmia ____________________________________________  RADIOLOGY  Ct Head Wo Contrast  Result Date: 05/21/2018 CLINICAL DATA:  Fall, hit back of head. EXAM: CT HEAD WITHOUT CONTRAST TECHNIQUE: Contiguous axial images were obtained from the base of the skull through the vertex without intravenous contrast. COMPARISON:  04/09/2018 FINDINGS: Brain: Mild age related volume loss. No acute intracranial abnormality. Specifically, no hemorrhage, hydrocephalus, mass lesion, acute infarction, or significant intracranial injury. Vascular: No hyperdense vessel or unexpected calcification. Skull: No acute calvarial abnormality. Sinuses/Orbits: Visualized paranasal sinuses and mastoids clear. Orbital soft  tissues unremarkable. Other: None IMPRESSION: No acute intracranial abnormality. Electronically Signed   By: Rolm Baptise M.D.   On: 05/21/2018 10:37   Dg Shoulder Left  Result Date: 05/21/2018 CLINICAL DATA:  Fall with left shoulder pain EXAM: LEFT SHOULDER - 2+ VIEW COMPARISON:  None. FINDINGS: No fracture. No evidence of left acromioclavicular separation. No left glenohumeral dislocation. No suspicious focal osseous lesions. Mild erosions at the left acromioclavicular joint. No significant left glenohumeral arthropathy. Tiny calcifications adjacent to the left greater tuberosity. No radiopaque foreign body. IMPRESSION: 1. No left shoulder fracture or malalignment. 2. Mild erosions at the left [facility] joint, nonspecific, differential includes rheumatoid arthritis, hyperparathyroidism or repetitive injury. 3. Tiny calcifications adjacent to the left greater tuberosity, suggesting insertional left rotator cuff calcific tendinopathy. Electronically Signed   By: Ilona Sorrel M.D.   On: 05/21/2018 09:54   CT of the head negative for acute.  Reviewed by me.  X-ray left shoulder, negative for acute fracture. ____________________________________________   PROCEDURES  Procedure(s) performed: None  Procedures  Critical Care performed: No  ____________________________________________   INITIAL IMPRESSION / ASSESSMENT AND PLAN / ED COURSE  Pertinent labs & imaging results that were available during my care of the patient were reviewed by me and considered in my medical decision making (see chart for details).  Patient returns for evaluation after a fall.  History of falls, and today she reports losing her balance while using her walker reaching the refrigerator.  Appears mechanical in nature without associated other clinical symptoms such as chest pain, new neurologic symptoms, shortness of breath etc.  Overall low risk for intracranial hemorrhage but given the age and ongoing headache  we will proceed with  CT of the head, also x-ray left shoulder.  Anticipate that if imaging is reassuring we will attempt to arrange for patient to get to her outpatient dialysis today at 1030 that she request, and I think would be very reasonable.  Clinical Course as of May 21 1204  Wed May 21, 2018  [address] dialysis can perform patients HD today (as planned). Working with RN to find transport, family not presently answering phone. Patient resting comfortably, no distress or concerns.    [MQ]    Clinical Course User Index [MQ] Delman Kitten, MD   ----------------------------------------- 11:48 AM on 05/21/2018 -----------------------------------------  Have been successful in contacting the patient dialysis center, they will be able to accept her and perform her dialysis today.  The patient's son is coming to pick her up, plan to take her to dialysis and then dialysis center reports that the [facility] service will be able to get her home.  Dr. Juleen China came by the ER, discussed patient case and plan for dialysis as outpatient today with him.  He is in agreement, knows the patient from previous.  Return precautions and treatment recommendations and follow-up discussed with the patient who is agreeable with the plan.   ----------------------------------------- 12:05 PM on 05/21/2018 -----------------------------------------  Patient's son has arrived to take her, will assist patient to vehicle.  ____________________________________________   FINAL CLINICAL IMPRESSION(S) / ED DIAGNOSES  Final diagnoses:  Closed head injury, initial encounter  Contusion of left shoulder, initial encounter      NEW MEDICATIONS STARTED DURING THIS VISIT:  New Prescriptions   No medications on file     Note:  This document was prepared using Dragon voice recognition software and may include unintentional dictation errors.     Delman Kitten, MD 05/21/18 [phone]

## 2018-05-21 NOTE — Discharge Instructions (Addendum)
Please attend your dialysis session today, your dialysis center is able to get you an appointment this afternoon and request you come to dialysis TODAY after discharge.  Signs of a more serious head injury include vomiting, severe headache, excessive sleepiness or confusion, and weakness or numbness in your face, arms or legs.  Return immediately to the Emergency Department if you experience any of these more concerning symptoms.

## 2018-05-22 ENCOUNTER — Emergency Department: Payer: Medicare Other

## 2018-05-22 ENCOUNTER — Emergency Department
Admission: EM | Admit: 2018-05-22 | Discharge: 2018-05-22 | Disposition: A | Discharge status: Home / Self Care | Payer: Medicare Other | Attending: Emergency Medicine | Admitting: Emergency Medicine

## 2018-05-22 ENCOUNTER — Other Ambulatory Visit: Payer: Self-pay

## 2018-05-22 DIAGNOSIS — E119 Type 2 diabetes mellitus without complications: Secondary | ICD-10-CM | POA: Insufficient documentation

## 2018-05-22 DIAGNOSIS — I132 Hypertensive heart and chronic kidney disease with heart failure and with stage 5 chronic kidney disease, or end stage renal disease: Secondary | ICD-10-CM | POA: Insufficient documentation

## 2018-05-22 DIAGNOSIS — I252 Old myocardial infarction: Secondary | ICD-10-CM | POA: Diagnosis not present

## 2018-05-22 DIAGNOSIS — I509 Heart failure, unspecified: Secondary | ICD-10-CM | POA: Diagnosis not present

## 2018-05-22 DIAGNOSIS — F419 Anxiety disorder, unspecified: Secondary | ICD-10-CM | POA: Insufficient documentation

## 2018-05-22 DIAGNOSIS — I2510 Atherosclerotic heart disease of native coronary artery without angina pectoris: Secondary | ICD-10-CM | POA: Insufficient documentation

## 2018-05-22 DIAGNOSIS — Z87891 Personal history of nicotine dependence: Secondary | ICD-10-CM | POA: Insufficient documentation

## 2018-05-22 DIAGNOSIS — Z9049 Acquired absence of other specified parts of digestive tract: Secondary | ICD-10-CM | POA: Insufficient documentation

## 2018-05-22 DIAGNOSIS — N186 End stage renal disease: Secondary | ICD-10-CM | POA: Diagnosis not present

## 2018-05-22 DIAGNOSIS — R531 Weakness: Secondary | ICD-10-CM | POA: Diagnosis not present

## 2018-05-22 DIAGNOSIS — Z992 Dependence on renal dialysis: Secondary | ICD-10-CM | POA: Insufficient documentation

## 2018-05-22 LAB — COMPREHENSIVE METABOLIC PANEL
ALK PHOS: 176 U/L — AB (ref 38–126)
ALT: 18 U/L (ref 0–44)
AST: 22 U/L (ref 15–41)
Albumin: 3.4 g/dL — ABNORMAL LOW (ref 3.5–5.0)
Anion gap: 13 (ref 5–15)
BUN: 70 mg/dL — AB (ref 8–23)
CO2: 28 mmol/L (ref 22–32)
Calcium: 8.5 mg/dL — ABNORMAL LOW (ref 8.9–10.3)
Chloride: 98 mmol/L (ref 98–111)
Creatinine, Ser: 8.41 mg/dL — ABNORMAL HIGH (ref 0.44–1.00)
GFR calc Af Amer: 5 mL/min — ABNORMAL LOW (ref >60)
GFR, EST NON AFRICAN AMERICAN: 4 mL/min — AB (ref >60)
Glucose, Bld: 201 mg/dL — ABNORMAL HIGH (ref 70–99)
Potassium: 5.4 mmol/L — ABNORMAL HIGH (ref 3.5–5.1)
Sodium: 139 mmol/L (ref 135–145)
TOTAL PROTEIN: 6.6 g/dL (ref 6.5–8.1)
Total Bilirubin: 0.5 mg/dL (ref 0.3–1.2)

## 2018-05-22 LAB — CBC WITH DIFFERENTIAL/PLATELET
BASOS ABS: 0 10*3/uL (ref 0–0.1)
Basophils Relative: 1 %
Eosinophils Absolute: 0.3 10*3/uL (ref 0–0.7)
Eosinophils Relative: 5 %
HEMATOCRIT: 36.2 % (ref 35.0–47.0)
Hemoglobin: 12.1 g/dL (ref 12.0–16.0)
LYMPHS PCT: 31 %
Lymphs Abs: 2.1 10*3/uL (ref 1.0–3.6)
MCH: 37.2 pg — ABNORMAL HIGH (ref 26.0–34.0)
MCHC: 33.5 g/dL (ref 32.0–36.0)
MCV: 111.1 fL — AB (ref 80.0–100.0)
MONO ABS: 0.7 10*3/uL (ref 0.2–0.9)
Monocytes Relative: 11 %
NEUTROS ABS: 3.4 10*3/uL (ref 1.4–6.5)
Neutrophils Relative %: 52 %
Platelets: 175 10*3/uL (ref 150–440)
RBC: 3.26 MIL/uL — ABNORMAL LOW (ref 3.80–5.20)
RDW: 16.1 % — ABNORMAL HIGH (ref 11.5–14.5)
WBC: 6.5 10*3/uL (ref 3.6–11.0)

## 2018-05-22 LAB — TROPONIN I

## 2018-05-22 NOTE — ED Provider Notes (Signed)
[facility] Emergency Department Provider Note       Time seen: ----------------------------------------- 3:19 PM on 05/22/2018 -----------------------------------------   I have reviewed the triage vital signs and the nursing notes.  HISTORY   Chief Complaint Weakness    HPI BABS DABBS is a 77 y.o. female with a history of end-stage renal disease on dialysis, coronary artery disease, GERD, gout, hyperlipidemia, hypertension, diabetes who presents to the ED for weakness.  Patient reportedly was seen yesterday here for a fall and had a negative CT scan.  She also is due for dialysis tomorrow, went to dialysis normally yesterday.  She complains of generalized weakness but denies any other complaints.  Past Medical History:  Diagnosis Date  . Anxiety   . CAD (coronary artery disease)   . Chronic anemia    Dr Inez Pilgrim  . Dysrhythmia   . GERD (gastroesophageal reflux disease)   . Glaucoma   . Gout   . Gout   . Hx of colonic polyps   . Hyperlipidemia   . Hypertension   . Lower back pain   . Miscarriage   . Myocardial infarction ([place])   . 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 Neuropathy   . 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 Peritoneal dialysis status ([place])   . Renal failure    Dr Holley Raring  . Renal insufficiency   . 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 Vaginal delivery    x 4    Patient Active Problem List   Diagnosis Date Noted  . Tremors of nervous system 04/08/2018  . Altered mental status 02/28/2018  . Complication of vascular access for dialysis 07/18/2017  . UTI (urinary tract infection) 01/22/2017  . CHF (congestive heart failure) ([place]) 12/11/2016  . Ds DNA antibody positive 07/09/2016  . Pneumonia 07/03/2016  . Anemia 06/17/2016  . Symptomatic anemia 06/15/2016  . Hypoglycemia 05/21/2016  . Chest pain 04/05/2016  .  Hypercalcemia 03/14/2016  . Bronchitis 03/03/2012  . Type II or unspecified type diabetes mellitus with renal manifestations, not stated as uncontrolled(250.40) 12/18/2011  . END STAGE RENAL DISEASE 10/23/2010  . DM (diabetes mellitus), type 2 ([place]) 12/13/2009  . NEUROPATHY 12/13/2009  . GOUT 02/15/2009  . SLEEP DISORDER 02/15/2009  . GERD 02/23/2008  . Hyperlipidemia 05/14/2007  . ANEMIA, CHRONIC 05/14/2007  . Essential hypertension 05/14/2007    Past Surgical History:  Procedure Laterality Date  . ABDOMINAL HYSTERECTOMY  1983  . AV FISTULA PLACEMENT Left 09/21/2016   Procedure: ARTERIOVENOUS (AV) FISTULA CREATION ( BRACHIOCEPHALIC );  Surgeon: Katha Cabal, MD;  Location: [facility] ORS;  Service: Vascular;  Laterality: Left;  . BACK SURGERY  6/08   Dr Collier Salina  . BACK SURGERY  1987   disc  . CHOLECYSTECTOMY    . DIALYSIS/PERMA CATHETER REMOVAL N/A 02/25/2017   Procedure: Dialysis/Perma Catheter Removal;  Surgeon: Algernon Huxley, MD;  Location: [facility];  Service: Cardiovascular;  Laterality: N/A;  . INSERTION OF DIALYSIS CATHETER  2017  . PERITONEAL CATHETER INSERTION    . REMOVAL OF A DIALYSIS CATHETER N/A 09/21/2016   Procedure: REMOVAL OF A DIALYSIS CATHETER ( PERITONEAL DIALYSIS CATH );  Surgeon: Katha Cabal, MD;  Location: [facility] ORS;  Service: Vascular;  Laterality: N/A;  . VITRECTOMY AND CATARACT Left 11/04/2017   Procedure: VITRECTOMY AND CATARACT;  Surgeon: Leandrew Koyanagi,  MD;  Location: [facility] ORS;  Service: Ophthalmology;  Laterality: Left;  US00:51.6 AP%21.8 HGD92.42 FLUID LOT #[id number] H    Allergies Alprazolam  Social History Social History   Tobacco Use  . Smoking status: Former Smoker    Last attempt to quit: 11/05/1990    Years since quitting: 27.5  . Smokeless tobacco: Never Used  Substance Use Topics  . Alcohol use: No  . Drug use: No   Review of Systems Constitutional: Negative for fever. Cardiovascular: Negative for chest  pain. Respiratory: Negative for shortness of breath. Gastrointestinal: Negative for abdominal pain, vomiting and diarrhea. Musculoskeletal: Negative for back pain. Skin: Negative for rash. Neurological: Positive for generalized weakness  All systems negative/normal/unremarkable except as stated in the HPI  ____________________________________________   PHYSICAL EXAM:  VITAL SIGNS: ED Triage Vitals  Enc Vitals Group     BP      Pulse      Resp      Temp      Temp src      SpO2      Weight      Height      Head Circumference      Peak Flow      Pain Score      Pain Loc      Pain Edu?      Excl. in [place]?    Constitutional: Alert and oriented. Well appearing and in no distress. Eyes: Conjunctivae are normal. Normal extraocular movements. ENT   Head: Normocephalic and atraumatic.   Nose: No congestion/rhinnorhea.   Mouth/Throat: Mucous membranes are moist.   Neck: No stridor. Cardiovascular: Normal rate, regular rhythm. No murmurs, rubs, or gallops.  Left arm AV fistula with thrill and bruit Respiratory: Normal respiratory effort without tachypnea nor retractions. Breath sounds are clear and equal bilaterally. No wheezes/rales/rhonchi. Gastrointestinal: Soft and nontender. Normal bowel sounds Musculoskeletal: Nontender with normal range of motion in extremities. No lower extremity tenderness nor edema. Neurologic:  Normal speech and language. No gross focal neurologic deficits are appreciated.  General is weakness, nothing focal Skin:  Skin is warm, dry and intact. No rash noted. Psychiatric: Mood and affect are normal. Speech and behavior are normal.  ____________________________________________  EKG: Interpreted by me.  Sinus rhythm rate of 54 bpm, normal PR interval, normal QRS, normal QT, left axis deviation  ____________________________________________  ED COURSE:  As part of my medical decision making, I reviewed the following data within the  [place] History obtained from family if available, nursing notes, old chart and ekg, as well as notes from prior ED visits. Patient presented for weakness, we will assess with labs and imaging as indicated at this time.   Procedures ____________________________________________   LABS (pertinent positives/negatives)  Labs Reviewed  CBC WITH DIFFERENTIAL/PLATELET  COMPREHENSIVE METABOLIC PANEL  TROPONIN I  URINALYSIS, COMPLETE (UACMP) WITH MICROSCOPIC  CBG MONITORING, ED    RADIOLOGY Images were viewed by me  CT head IMPRESSION: Carotid and vertebral artery atherosclerosis and mild generalized atrophy without acute abnormality.  ____________________________________________  DIFFERENTIAL DIAGNOSIS   Dehydration, electrolyte abnormality, CVA, renal failure  FINAL ASSESSMENT AND PLAN  Weakness   Plan: The patient had presented for weakness with no specific etiology evident. Patient's labs appear to be stable with a need for dialysis tomorrow. Patient's CT head was negative.  MRI is still pending at this time.   Laurence Aly, MD   Note: This note was generated in part or whole with voice recognition software. Voice  recognition is usually quite accurate but there are transcription errors that can and very often do occur. I apologize for any typographical errors that were not detected and corrected.     Earleen Newport, MD 05/22/18 [phone]

## 2018-05-22 NOTE — ED Notes (Signed)
Pt to MRI

## 2018-05-22 NOTE — ED Provider Notes (Signed)
-----------------------------------------   8:50 PM on 05/22/2018 -----------------------------------------   Blood pressure (!) 161/60, pulse (!) 55, temperature 97.7 F (36.5 C), temperature source Oral, resp. rate 12, height 5\' 7"  (1.702 m), weight 83 kg (183 lb), SpO2 97 %.  Assuming care from Dr. Jimmye Norman of Laurie Steele is a 77 y.o. female with a chief complaint of Weakness .    Please refer to H&P by previous MD for further details.  The current plan of care is to f/u result of MRI of the brain.   I have personally reviewed the images performed during this visit and I agree with the Radiologist's read.   Interpretation by Radiologist:  Ct Head Wo Contrast  Result Date: 05/22/2018 CLINICAL DATA:  Altered mental status. EXAM: CT HEAD WITHOUT CONTRAST TECHNIQUE: Contiguous axial images were obtained from the base of the skull through the vertex without intravenous contrast. COMPARISON:  Head CT 05/21/2018 FINDINGS: Brain: There is no mass, hemorrhage or extra-axial collection. There is generalized atrophy without lobar predilection. There is no acute or chronic infarction. The brain parenchyma is normal. Vascular: Atherosclerotic calcification of the vertebral and internal carotid arteries at the skull base. No abnormal hyperdensity of the major intracranial arteries or dural venous sinuses. Skull: The visualized skull base, calvarium and extracranial soft tissues are normal. Sinuses/Orbits: No fluid levels or advanced mucosal thickening of the visualized paranasal sinuses. No mastoid or middle ear effusion. Left lens replacement. IMPRESSION: Carotid and vertebral artery atherosclerosis and mild generalized atrophy without acute abnormality. Electronically Signed   By: Ulyses Jarred M.D.   On: 05/22/2018 15:46   Mr Brain Wo Contrast  Result Date: 05/22/2018 CLINICAL DATA:  Weakness and fall. History of end-stage renal disease on dialysis, hypertension, hyperlipidemia. EXAM: MRI HEAD  WITHOUT CONTRAST TECHNIQUE: Multiplanar, multiecho pulse sequences of the brain and surrounding structures were obtained without intravenous contrast. COMPARISON:  CT HEAD May 22, 2018 and MRI of the head April 09, 2018 and MRA head January 25, 2017. FINDINGS: INTRACRANIAL CONTENTS: No reduced diffusion to suggest acute ischemia. No susceptibility artifact to suggest hemorrhage. The ventricles and sulci are normal for patient's age. Patchy supratentorial white matter FLAIR T2 hyperintensities. No suspicious parenchymal signal, masses, mass effect. No abnormal extra-axial fluid collections. No extra-axial masses. VASCULAR: Similar attenuated LEFT V3 and proximal V4 segments. Major intracranial vascular flow voids otherwise preserved. SKULL AND UPPER CERVICAL SPINE: No abnormal sellar expansion. No suspicious calvarial bone marrow signal. Craniocervical junction maintained. moderate suspected C4-5 canal stenosis. SINUSES/ORBITS: The mastoid air-cells and included paranasal sinuses are well-aerated.The included ocular globes and orbital contents are non-suspicious. OTHER: None. IMPRESSION: 1. No acute intracranial process. 2. Chronic slow flow versus focally occluded LEFT V3 segment. 3. Stable moderate parenchymal brain volume loss and mild chronic small vessel ischemic changes. Electronically Signed   By: Elon Alas M.D.   On: 05/22/2018 20:30     MRI negative for acute findings. Patient remains neuro intact. Will be dc to care of family member with recommendation to follow-up with primary care doctor on Monday.  Discussed return precautions for any signs of acute stroke, chest pain, shortness of breath, or fever.        Rudene Re, MD 05/22/18 2052

## 2018-05-22 NOTE — ED Triage Notes (Signed)
Pt is here by EMS due to weakness and dizziness. She was seen here yesterday for mechanical fall while walking with her walker. She had complete work up which was negative. She states she feels more weak today and her MD requests reevaluation with potential MRI today. Denies headache. Right side weakness noted and pt states this is normal as she has had a stroke that affected right side. MD at beside to assess pt upon her arrival to room.

## 2018-07-03 ENCOUNTER — Encounter (INDEPENDENT_AMBULATORY_CARE_PROVIDER_SITE_OTHER): Payer: Self-pay | Admitting: Vascular Surgery

## 2018-07-03 ENCOUNTER — Ambulatory Visit (INDEPENDENT_AMBULATORY_CARE_PROVIDER_SITE_OTHER): Payer: Medicare Other | Admitting: Vascular Surgery

## 2018-07-03 VITALS — BP 155/73 | HR 66 | Resp 14 | Ht 67.5 in | Wt 170.0 lb

## 2018-07-03 DIAGNOSIS — E782 Mixed hyperlipidemia: Secondary | ICD-10-CM | POA: Diagnosis not present

## 2018-07-03 DIAGNOSIS — R0989 Other specified symptoms and signs involving the circulatory and respiratory systems: Secondary | ICD-10-CM | POA: Diagnosis not present

## 2018-07-03 DIAGNOSIS — E118 Type 2 diabetes mellitus with unspecified complications: Secondary | ICD-10-CM

## 2018-07-03 DIAGNOSIS — I10 Essential (primary) hypertension: Secondary | ICD-10-CM

## 2018-07-03 NOTE — Progress Notes (Signed)
Subjective:    Patient ID: Laurie Steele, female    DOB: 11-May-1941, 77 y.o.   MRN: [id number] Chief Complaint  Patient presents with  . Follow-up    Decreased circulation to the toes/Discoloration   Patient well-known to our office as we follow her for end-stage renal disease and carotid artery stenosis.  Patient last seen on January 16, 2018.  The patient was referred back to [place] by Dr. Roque Lias for evaluation of decreased pedal pulses and "bluish tint" to the toes.  The patient presents with her husband.  The patient does have a past medical history of a stroke and is limited in ambulation.  The patient denies any claudication-like symptoms, rest pain or ulcer formation to the bilateral lower extremity however she does state that her calves "hurt her" all the time.  The patient recently went to the podiatrist and had an ingrown toenail removed from the right first big toe.  He has been notes to point out that this area seems to be healing slowly.  Patient denies any fever, nausea vomiting.  Review of Systems  Constitutional: Negative.   HENT: Negative.   Eyes: Negative.   Respiratory: Negative.   Cardiovascular:       Lower extremity pain  Gastrointestinal: Negative.   Endocrine: Negative.   Genitourinary:       ESRD  Musculoskeletal: Negative.   Skin: Positive for color change.  Allergic/Immunologic: Negative.   Neurological: Negative.   Hematological: Negative.   Psychiatric/Behavioral: Negative.       Objective:   Physical Exam  Constitutional: She is oriented to person, place, and time. She appears well-developed and well-nourished. No distress.  HENT:  Head: Normocephalic and atraumatic.  Right Ear: External ear normal.  Left Ear: External ear normal.  Eyes: Pupils are equal, round, and reactive to light. Conjunctivae and EOM are normal.  Neck: Normal range of motion.  Cardiovascular: Normal rate, regular rhythm, normal heart sounds and intact distal pulses.  Pulses:      Radial pulses are 2+ on the right side, and 2+ on the left side.  Hard to palpate pedal pulses however the bilateral feet are warm  Pulmonary/Chest: Effort normal and breath sounds normal.  Musculoskeletal: Normal range of motion. She exhibits edema (Mild 1+ pitting edema noted bilaterally).  Neurological: She is alert and oriented to person, place, and time.  Skin: She is not diaphoretic.  There is a bluish tint to the toes 2 through 5 bilaterally. Right first toe without wound however does look slightly macerated.  Psychiatric: She has a normal mood and affect. Her behavior is normal. Judgment and thought content normal.  Vitals reviewed.  BP (!) 155/73 (BP Location: Right Arm, Patient Position: Sitting)   Pulse 66   Resp 14   Ht 5' 7.5" (1.715 m)   Wt 170 lb (77.1 kg)   BMI 26.23 kg/m   Past Medical History:  Diagnosis Date  . Anxiety   . CAD (coronary artery disease)   . Chronic anemia    Dr Inez Pilgrim  . Dysrhythmia   . GERD (gastroesophageal reflux disease)   . Glaucoma   . Gout   . Gout   . Hx of colonic polyps   . Hyperlipidemia   . Hypertension   . Lower back pain   . Miscarriage   . Myocardial infarction ([place])   . 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 Neuropathy   . NIDDM (non-insulin dependent diabetes  mellitus)    with retinopathy , and nephropathy  . Peritoneal dialysis status ([place])   . Renal failure    Dr Holley Raring  . Renal insufficiency   . 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 Vaginal delivery    x 4   Social History   Socioeconomic History  . Marital status: Widowed    Spouse name: Not on file  . Number of children: 4  . Years of education: Not on file  . Highest education level: Not on file  Occupational History  . Occupation: retired    Fish farm manager: RETIRED    Comment: [employer]  Social Needs  . Financial resource strain: Not on file  . Food insecurity:    Worry: Not on file     Inability: Not on file  . Transportation needs:    Medical: Not on file    Non-medical: Not on file  Tobacco Use  . Smoking status: Former Smoker    Last attempt to quit: 11/05/1990    Years since quitting: 27.6  . Smokeless tobacco: Never Used  Substance and Sexual Activity  . Alcohol use: No  . Drug use: No  . Sexual activity: Not Currently  Lifestyle  . Physical activity:    Days per week: Not on file    Minutes per session: Not on file  . Stress: Not on file  Relationships  . Social connections:    Talks on phone: Not on file    Gets together: Not on file    Attends religious service: Not on file    Active member of club or organization: Not on file    Attends meetings of clubs or organizations: Not on file    Relationship status: Not on file  . Intimate partner violence:    Fear of current or ex partner: Not on file    Emotionally abused: Not on file    Physically abused: Not on file    Forced sexual activity: Not on file  Other Topics Concern  . Not on file  Social History Narrative   Lives at home with son. Has a walker. Dependent for dome daily activities   Past Surgical History:  Procedure Laterality Date  . ABDOMINAL HYSTERECTOMY  1983  . AV FISTULA PLACEMENT Left 09/21/2016   Procedure: ARTERIOVENOUS (AV) FISTULA CREATION ( BRACHIOCEPHALIC );  Surgeon: Katha Cabal, MD;  Location: [facility] ORS;  Service: Vascular;  Laterality: Left;  . BACK SURGERY  6/08   Dr Collier Salina  . BACK SURGERY  1987   disc  . CHOLECYSTECTOMY    . DIALYSIS/PERMA CATHETER REMOVAL N/A 02/25/2017   Procedure: Dialysis/Perma Catheter Removal;  Surgeon: Algernon Huxley, MD;  Location: [facility];  Service: Cardiovascular;  Laterality: N/A;  . INSERTION OF DIALYSIS CATHETER  2017  . PERITONEAL CATHETER INSERTION    . REMOVAL OF A DIALYSIS CATHETER N/A 09/21/2016   Procedure: REMOVAL OF A DIALYSIS CATHETER ( PERITONEAL DIALYSIS CATH );  Surgeon: Katha Cabal, MD;  Location: [facility]  ORS;  Service: Vascular;  Laterality: N/A;  . VITRECTOMY AND CATARACT Left 11/04/2017   Procedure: VITRECTOMY AND CATARACT;  Surgeon: Leandrew Koyanagi, MD;  Location: [facility] ORS;  Service: Ophthalmology;  Laterality: Left;  US00:51.6 AP%21.8 PXT06.26 FLUID LOT #[id number]   Family History  Problem Relation Age of Onset  . Heart attack Father   . Hypertension Mother   . Hyperlipidemia Mother   . Coronary artery  disease Unknown        strong fam hx  . Kidney failure Brother   . Breast cancer Neg Hx    Allergies  Allergen Reactions  . Alprazolam Other (See Comments)    Unable to sleep and confusion      Assessment & Plan:  Patient well-known to our office as we follow her for end-stage renal disease and carotid artery stenosis.  Patient last seen on January 16, 2018.  The patient was referred back to [place] by Dr. Roque Lias for evaluation of decreased pedal pulses and "bluish tint" to the toes.  The patient presents with her husband.  The patient does have a past medical history of a stroke and is limited in ambulation.  The patient denies any claudication-like symptoms, rest pain or ulcer formation to the bilateral lower extremity however she does state that her calves "hurt her" all the time.  The patient recently went to the podiatrist and had an ingrown toenail removed from the right first big toe.  He has been notes to point out that this area seems to be healing slowly.  Patient denies any fever, nausea vomiting.  1. Decreased pedal pulses - New Patient with multiple risk factors for peripheral artery disease Hard to palpate pedal pulses on exam Bluish tint to toes 2 through 5 bilaterally I will bring the patient back and have her undergo a bilateral ABI to assess for any contributing peripheral artery disease I have discussed with the patient at length the risk factors for and pathogenesis of atherosclerotic disease and encouraged a healthy diet, regular exercise regimen and blood pressure  / glucose control.  The patient was encouraged to call the office in the interim if he experiences any claudication like symptoms, rest pain or ulcers to his feet / toes.  - VAS [place] ABI WITH/WO TBI; Future  2. Essential hypertension - Stable Encouraged good control as its slows the progression of atherosclerotic disease  3. Type 2 diabetes mellitus with complication, unspecified whether long term insulin use (HCC) - Stable Encouraged good control as its slows the progression of atherosclerotic disease  4. Mixed hyperlipidemia - Stable Encouraged good control as its slows the progression of atherosclerotic disease  Current Outpatient Medications on File Prior to Visit  Medication Sig Dispense Refill  . acetaminophen (TYLENOL) 650 MG CR tablet Take 650 mg by mouth every 8 (eight) hours as needed for pain.    Marland Kitchen allopurinol (ZYLOPRIM) 100 MG tablet Take 200 mg by mouth at bedtime.    Marland Kitchen amitriptyline (ELAVIL) 25 MG tablet Take 25 mg by mouth at bedtime as needed. for sleep  0  . amLODipine (NORVASC) 10 MG tablet Take 10 mg by mouth daily.  0  . ASPERCREME W/LIDOCAINE 4 % CREA Apply topically as needed  5  . aspirin EC 81 MG tablet Take 1 tablet (81 mg total) by mouth daily. 30 tablet 1  . brimonidine (ALPHAGAN) 0.2 % ophthalmic solution Apply to eye.    . cloNIDine (CATAPRES) 0.1 MG tablet Take 0.1 mg by mouth 2 (two) times daily. TAKE 0.1MG  BY MOUTH ON Sunday,TUESDAY,THURSDAY, AND SATURDAY    . clopidogrel (PLAVIX) 75 MG tablet Take 1 tablet (75 mg total) by mouth daily. 30 tablet 0  . colchicine 0.6 MG tablet Take 0.5 tablets (0.3 mg total) by mouth 2 (two) times a week. 4 tablet 2  . docusate sodium (COLACE) 100 MG capsule Take 1 capsule (100 mg total) by mouth daily as needed for  mild constipation. 30 capsule 0  . dorzolamide-timolol (COSOPT) 22.3-6.8 MG/ML ophthalmic solution INSTILL 1 DROP IN LEFT EYE TWICE A DAY  6  . famotidine (PEPCID) 20 MG tablet Take 20 mg by mouth at bedtime.       . feeding supplement, ENSURE ENLIVE, (ENSURE ENLIVE) LIQD Take 237 mLs by mouth 2 (two) times daily between meals. 237 mL 12  . ferrous sulfate 325 (65 FE) MG tablet Take 1 tablet (325 mg total) by mouth daily with breakfast. 30 tablet 3  . fluticasone (FLONASE) 50 MCG/ACT nasal spray Place into the nose.    . furosemide (LASIX) 40 MG tablet Take 1 tablet (40 mg total) by mouth daily. 30 tablet 2  . gabapentin (NEURONTIN) 300 MG capsule Take 300 mg by mouth 3 (three) times daily.  5  . glimepiride (AMARYL) 1 MG tablet Take 1 mg by mouth daily with breakfast.    . glipiZIDE (GLUCOTROL) 5 MG tablet Take 5 mg by mouth daily.  2  . hydrALAZINE (APRESOLINE) 50 MG tablet Take 50 mg by mouth 2 (two) times daily. ONLY ON Tuesday,THURSDAY,SATURDAY ,AND SUNDAY    . Infant Care Products (DERMACLOUD) CREA Apply topically.    . insulin aspart (NOVOLOG) 100 UNIT/ML injection Inject 0-5 Units into the skin at bedtime. CBG < 70: implement hypoglycemia protocol  CBG 70 - 120: 0 units  CBG 121 - 150: 2 units  CBG 151 - 200: 3 units  CBG 201 - 250: 5 units  CBG 251 - 300: 8 units  CBG 301 - 350: 11 units  CBG 351 - 400: 15 units 10 mL 0  . ipratropium (ATROVENT) 0.03 % nasal spray Place 1 spray into both nostrils 3 (three) times daily.     Marland Kitchen ipratropium-albuterol (DUONEB) 0.5-2.5 (3) MG/3ML SOLN Take 3 mLs by nebulization every 6 (six) hours as needed. 360 mL 0  . isosorbide mononitrate (IMDUR) 30 MG 24 hr tablet Take 30 mg by mouth 2 (two) times daily.  0  . latanoprost (XALATAN) 0.005 % ophthalmic solution Place 1 drop into both eyes at bedtime.  6  . metoprolol succinate (TOPROL-XL) 25 MG 24 hr tablet Take 1 tablet (25 mg total) by mouth at bedtime. 30 tablet 0  . mirtazapine (REMERON) 15 MG tablet Take 15 mg by mouth at bedtime.  0  . multivitamin (RENA-VIT) TABS tablet Take 1 tablet by mouth daily.    . pantoprazole (PROTONIX) 40 MG tablet Take 1 tablet (40 mg total) by mouth daily. 30 tablet 1  .  pravastatin (PRAVACHOL) 40 MG tablet Take 40 mg by mouth at bedtime.     . sevelamer carbonate (RENVELA) 800 MG tablet Take 800 mg by mouth 3 (three) times daily with meals.    . traMADol (ULTRAM) 50 MG tablet Take by mouth every 6 (six) hours as needed.     No current facility-administered medications on file prior to visit.    There are no Patient Instructions on file for this visit. No follow-ups on file.  Janayah Zavada A Aisia Correira, PA-C

## 2018-07-10 ENCOUNTER — Ambulatory Visit (INDEPENDENT_AMBULATORY_CARE_PROVIDER_SITE_OTHER): Payer: 59 | Admitting: Vascular Surgery

## 2018-07-10 ENCOUNTER — Ambulatory Visit (INDEPENDENT_AMBULATORY_CARE_PROVIDER_SITE_OTHER): Payer: Medicare Other

## 2018-07-10 ENCOUNTER — Encounter (INDEPENDENT_AMBULATORY_CARE_PROVIDER_SITE_OTHER): Payer: Self-pay | Admitting: Vascular Surgery

## 2018-07-10 VITALS — BP 193/66 | HR 66 | Resp 16 | Ht 67.5 in | Wt 170.0 lb

## 2018-07-10 DIAGNOSIS — R0989 Other specified symptoms and signs involving the circulatory and respiratory systems: Secondary | ICD-10-CM | POA: Diagnosis not present

## 2018-07-10 DIAGNOSIS — I509 Heart failure, unspecified: Secondary | ICD-10-CM

## 2018-07-10 DIAGNOSIS — T829XXS Unspecified complication of cardiac and vascular prosthetic device, implant and graft, sequela: Secondary | ICD-10-CM

## 2018-07-10 NOTE — Progress Notes (Signed)
Subjective:    Patient ID: Laurie Steele, female    DOB: 02-02-1941, 77 y.o.   MRN: [id number] Chief Complaint  Patient presents with  . Follow-up    abi ultrasound   Patient presents to review vascular studies. The patient was last seen on 07/03/18 for evaluation of decreased pedal pulses and bluish tint to her toes. The patients symptoms are stable. Patient does not ambulate. Denies claudication, rest pain or ulcer formation.  ABI was notable for noncompressible vessels due to medial calcification.  Abnormal right big toe waveforms.  Normal left big toe waveforms. Right: 1.38, Left: 1.40.   Review of Systems  Constitutional: Negative.   HENT: Negative.   Eyes: Negative.   Respiratory: Negative.   Cardiovascular: Positive for leg swelling.  Gastrointestinal: Negative.   Endocrine: Negative.   Genitourinary: Negative.   Musculoskeletal: Negative.   Skin: Positive for color change.  Allergic/Immunologic: Negative.   Neurological: Negative.   Hematological: Negative.   Psychiatric/Behavioral: Negative.       Objective:   Physical Exam  Constitutional: She is oriented to person, place, and time. She appears well-developed and well-nourished. No distress.  HENT:  Head: Normocephalic and atraumatic.  Right Ear: External ear normal.  Left Ear: External ear normal.  Eyes: Pupils are equal, round, and reactive to light. Conjunctivae and EOM are normal.  Neck: Normal range of motion.  Cardiovascular: Normal rate, regular rhythm, normal heart sounds and intact distal pulses.  Pulses:      Radial pulses are 2+ on the right side, and 2+ on the left side.  Hard to palpate pedal pulses. But bilateral feet are warm.  Dialysis access: good bruit and thrill  Pulmonary/Chest: Effort normal and breath sounds normal.  Musculoskeletal: Normal range of motion. She exhibits edema (Mild bilateral lower extremity edema noted).  Neurological: She is alert and oriented to person, place, and time.   Skin: Skin is warm and dry. She is not diaphoretic.  No bluish tint noted on todays exam  Psychiatric: She has a normal mood and affect. Her behavior is normal. Judgment and thought content normal.  Vitals reviewed.  BP (!) 193/66 (BP Location: Right Arm)   Pulse 66   Resp 16   Ht 5' 7.5" (1.715 m)   Wt 170 lb (77.1 kg)   BMI 26.23 kg/m   Past Medical History:  Diagnosis Date  . Anxiety   . CAD (coronary artery disease)   . Chronic anemia    Dr Inez Pilgrim  . Dysrhythmia   . GERD (gastroesophageal reflux disease)   . Glaucoma   . Gout   . Gout   . Hx of colonic polyps   . Hyperlipidemia   . Hypertension   . Lower back pain   . Miscarriage   . Myocardial infarction ([place])   . 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 Neuropathy   . 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 Peritoneal dialysis status ([place])   . Renal failure    Dr Holley Raring  . Renal insufficiency   . 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 Vaginal delivery    x 4   Social History   Socioeconomic History  . Marital status: Widowed    Spouse name: Not on file  . Number of children: 4  . Years of education: Not on file  . Highest education level: Not  on file  Occupational History  . Occupation: retired    Fish farm manager: RETIRED    Comment: [employer]  Social Needs  . Financial resource strain: Not on file  . Food insecurity:    Worry: Not on file    Inability: Not on file  . Transportation needs:    Medical: Not on file    Non-medical: Not on file  Tobacco Use  . Smoking status: Former Smoker    Last attempt to quit: 11/05/1990    Years since quitting: 27.6  . Smokeless tobacco: Never Used  Substance and Sexual Activity  . Alcohol use: No  . Drug use: No  . Sexual activity: Not Currently  Lifestyle  . Physical activity:    Days per week: Not on file    Minutes per session: Not on file  .  Stress: Not on file  Relationships  . Social connections:    Talks on phone: Not on file    Gets together: Not on file    Attends religious service: Not on file    Active member of club or organization: Not on file    Attends meetings of clubs or organizations: Not on file    Relationship status: Not on file  . Intimate partner violence:    Fear of current or ex partner: Not on file    Emotionally abused: Not on file    Physically abused: Not on file    Forced sexual activity: Not on file  Other Topics Concern  . Not on file  Social History Narrative   Lives at home with son. Has a walker. Dependent for dome daily activities   Past Surgical History:  Procedure Laterality Date  . ABDOMINAL HYSTERECTOMY  1983  . AV FISTULA PLACEMENT Left 09/21/2016   Procedure: ARTERIOVENOUS (AV) FISTULA CREATION ( BRACHIOCEPHALIC );  Surgeon: Katha Cabal, MD;  Location: [facility];  Service: Vascular;  Laterality: Left;  . BACK SURGERY  6/08   Dr Collier Salina  . BACK SURGERY  1987   disc  . CHOLECYSTECTOMY    . DIALYSIS/PERMA CATHETER REMOVAL N/A 02/25/2017   Procedure: Dialysis/Perma Catheter Removal;  Surgeon: Algernon Huxley, MD;  Location: [facility];  Service: Cardiovascular;  Laterality: N/A;  . INSERTION OF DIALYSIS CATHETER  2017  . PERITONEAL CATHETER INSERTION    . REMOVAL OF A DIALYSIS CATHETER N/A 09/21/2016   Procedure: REMOVAL OF A DIALYSIS CATHETER ( PERITONEAL DIALYSIS CATH );  Surgeon: Katha Cabal, MD;  Location: [facility] OR[facility];  Service: Vascular;  Laterality: N/A;  . VITRECTOMY AND CATARACT Left 11/04/2017   Procedure: VITRECTOMY AND CATARACT;  Surgeon: Leandrew Koyanagi, MD;  Location: [facility];  Service: Ophthalmology;  Laterality: Left;  US00:51.6 AP%21.8 WER15.40 FLUID LOT #[id number]   Family History  Problem Relation Age of Onset  . Heart attack Father   . Hypertension Mother   . Hyperlipidemia Mother   . Coronary artery disease Unknown        strong fam  hx  . Kidney failure Brother   . Breast cancer Neg Hx    Allergies  Allergen Reactions  . Alprazolam Other (See Comments)    Unable to sleep and confusion      Assessment & Plan:  Patient presents to review vascular studies. The patient was last seen on 07/03/18 for evaluation of decreased pedal pulses and bluish tint to her toes. The patients symptoms are stable. Patient does not ambulate. Denies claudication, rest pain or ulcer  formation.  ABI was notable for noncompressible vessels due to medial calcification.  Abnormal right big toe waveforms.  Normal left big toe waveforms. Right: 1.38, Left: 1.40.   1. Complication of vascular access for dialysis, sequela - Stable No issues with the patients dialysis access at this time. Follow up as per normal routine  2. Decreased pedal pulses - Stable ABI: [place] with abnormal right big toe waves, Left normal big toe waves. I do not feel the patients bluish tint is from ischemia. I will bring the patient back and have her undergo a bilateral venous duplex in an attempt to rule out and contributing venous vs lymphatic disease The patient was encouraged to wear graduated compression stockings (20-30 mmHg) on a daily basis. The patient was instructed to begin wearing the stockings first thing in the morning and removing them in the evening. The patient was instructed specifically not to sleep in the stockings. Prescription given.  In addition, behavioral modification including elevation during the day will be initiated. Information on compression stockings was given to the patient. The patient was instructed to call the office in the interim if any worsening edema or ulcerations to the legs, feet or toes occurs. The patient expresses their understanding  - VAS [place] LOWER EXTREMITY VENOUS REFLUX; Future  3. Congestive heart failure, unspecified HF chronicity, unspecified heart failure type ([place]) - Stable Contributing factor to the patients edema and bluish  tint to toes  Current Outpatient Medications on File Prior to Visit  Medication Sig Dispense Refill  . acetaminophen (TYLENOL) 650 MG CR tablet Take 650 mg by mouth every 8 (eight) hours as needed for pain.    Marland Kitchen allopurinol (ZYLOPRIM) 100 MG tablet Take 200 mg by mouth at bedtime.    Marland Kitchen amitriptyline (ELAVIL) 25 MG tablet Take 25 mg by mouth at bedtime as needed. for sleep  0  . amLODipine (NORVASC) 10 MG tablet Take 10 mg by mouth daily.  0  . ASPERCREME W/LIDOCAINE 4 % CREA Apply topically as needed  5  . aspirin EC 81 MG tablet Take 1 tablet (81 mg total) by mouth daily. 30 tablet 1  . brimonidine (ALPHAGAN) 0.2 % ophthalmic solution Apply to eye.    . cloNIDine (CATAPRES) 0.1 MG tablet Take 0.1 mg by mouth 2 (two) times daily. TAKE 0.1MG  BY MOUTH ON Sunday,TUESDAY,THURSDAY, AND SATURDAY    . clopidogrel (PLAVIX) 75 MG tablet Take 1 tablet (75 mg total) by mouth daily. 30 tablet 0  . colchicine 0.6 MG tablet Take 0.5 tablets (0.3 mg total) by mouth 2 (two) times a week. 4 tablet 2  . docusate sodium (COLACE) 100 MG capsule Take 1 capsule (100 mg total) by mouth daily as needed for mild constipation. 30 capsule 0  . dorzolamide-timolol (COSOPT) 22.3-6.8 MG/ML ophthalmic solution INSTILL 1 DROP IN LEFT EYE TWICE A DAY  6  . famotidine (PEPCID) 20 MG tablet Take 20 mg by mouth at bedtime.     . feeding supplement, ENSURE ENLIVE, (ENSURE ENLIVE) LIQD Take 237 mLs by mouth 2 (two) times daily between meals. 237 mL 12  . ferrous sulfate 325 (65 FE) MG tablet Take 1 tablet (325 mg total) by mouth daily with breakfast. 30 tablet 3  . fluticasone (FLONASE) 50 MCG/ACT nasal spray Place into the nose.    . furosemide (LASIX) 40 MG tablet Take 1 tablet (40 mg total) by mouth daily. 30 tablet 2  . gabapentin (NEURONTIN) 300 MG capsule Take 300 mg by mouth  3 (three) times daily.  5  . glimepiride (AMARYL) 1 MG tablet Take 1 mg by mouth daily with breakfast.    . glipiZIDE (GLUCOTROL) 5 MG tablet Take 5 mg  by mouth daily.  2  . hydrALAZINE (APRESOLINE) 50 MG tablet Take 50 mg by mouth 2 (two) times daily. ONLY ON Tuesday,THURSDAY,SATURDAY ,AND SUNDAY    . Infant Care Products (DERMACLOUD) CREA Apply topically.    . insulin aspart (NOVOLOG) 100 UNIT/ML injection Inject 0-5 Units into the skin at bedtime. CBG < 70: implement hypoglycemia protocol  CBG 70 - 120: 0 units  CBG 121 - 150: 2 units  CBG 151 - 200: 3 units  CBG 201 - 250: 5 units  CBG 251 - 300: 8 units  CBG 301 - 350: 11 units  CBG 351 - 400: 15 units 10 mL 0  . ipratropium (ATROVENT) 0.03 % nasal spray Place 1 spray into both nostrils 3 (three) times daily.     Marland Kitchen ipratropium-albuterol (DUONEB) 0.5-2.5 (3) MG/3ML SOLN Take 3 mLs by nebulization every 6 (six) hours as needed. 360 mL 0  . isosorbide mononitrate (IMDUR) 30 MG 24 hr tablet Take 30 mg by mouth 2 (two) times daily.  0  . latanoprost (XALATAN) 0.005 % ophthalmic solution Place 1 drop into both eyes at bedtime.  6  . metoprolol succinate (TOPROL-XL) 25 MG 24 hr tablet Take 1 tablet (25 mg total) by mouth at bedtime. 30 tablet 0  . mirtazapine (REMERON) 15 MG tablet Take 15 mg by mouth at bedtime.  0  . multivitamin (RENA-VIT) TABS tablet Take 1 tablet by mouth daily.    . pantoprazole (PROTONIX) 40 MG tablet Take 1 tablet (40 mg total) by mouth daily. 30 tablet 1  . pravastatin (PRAVACHOL) 40 MG tablet Take 40 mg by mouth at bedtime.     . sevelamer carbonate (RENVELA) 800 MG tablet Take 800 mg by mouth 3 (three) times daily with meals.    . traMADol (ULTRAM) 50 MG tablet Take by mouth every 6 (six) hours as needed.     No current facility-administered medications on file prior to visit.    There are no Patient Instructions on file for this visit. No follow-ups on file.  Delcie Ruppert A Monee Dembeck, PA-C

## 2018-07-14 ENCOUNTER — Encounter (INDEPENDENT_AMBULATORY_CARE_PROVIDER_SITE_OTHER): Payer: Self-pay | Admitting: Vascular Surgery

## 2018-08-06 ENCOUNTER — Encounter (INDEPENDENT_AMBULATORY_CARE_PROVIDER_SITE_OTHER): Payer: Self-pay

## 2018-08-08 ENCOUNTER — Other Ambulatory Visit (INDEPENDENT_AMBULATORY_CARE_PROVIDER_SITE_OTHER): Payer: Self-pay | Admitting: Nurse Practitioner

## 2018-08-12 ENCOUNTER — Encounter (INDEPENDENT_AMBULATORY_CARE_PROVIDER_SITE_OTHER): Payer: Self-pay

## 2018-08-12 ENCOUNTER — Encounter (INDEPENDENT_AMBULATORY_CARE_PROVIDER_SITE_OTHER): Payer: 59

## 2018-08-12 ENCOUNTER — Ambulatory Visit (INDEPENDENT_AMBULATORY_CARE_PROVIDER_SITE_OTHER): Payer: 59 | Admitting: Vascular Surgery

## 2018-08-21 ENCOUNTER — Emergency Department
Admission: EM | Admit: 2018-08-21 | Discharge: 2018-08-21 | Disposition: A | Discharge status: Home / Self Care | Payer: Medicare Other | Attending: Emergency Medicine | Admitting: Emergency Medicine

## 2018-08-21 ENCOUNTER — Emergency Department: Payer: Medicare Other

## 2018-08-21 ENCOUNTER — Other Ambulatory Visit: Payer: Self-pay

## 2018-08-21 DIAGNOSIS — Z992 Dependence on renal dialysis: Secondary | ICD-10-CM | POA: Diagnosis not present

## 2018-08-21 DIAGNOSIS — E1122 Type 2 diabetes mellitus with diabetic chronic kidney disease: Secondary | ICD-10-CM | POA: Diagnosis not present

## 2018-08-21 DIAGNOSIS — Z794 Long term (current) use of insulin: Secondary | ICD-10-CM | POA: Diagnosis not present

## 2018-08-21 DIAGNOSIS — Z79899 Other long term (current) drug therapy: Secondary | ICD-10-CM | POA: Diagnosis not present

## 2018-08-21 DIAGNOSIS — Z7902 Long term (current) use of antithrombotics/antiplatelets: Secondary | ICD-10-CM | POA: Diagnosis not present

## 2018-08-21 DIAGNOSIS — Z7982 Long term (current) use of aspirin: Secondary | ICD-10-CM | POA: Diagnosis not present

## 2018-08-21 DIAGNOSIS — M5431 Sciatica, right side: Secondary | ICD-10-CM

## 2018-08-21 DIAGNOSIS — M79604 Pain in right leg: Secondary | ICD-10-CM | POA: Diagnosis present

## 2018-08-21 DIAGNOSIS — E11319 Type 2 diabetes mellitus with unspecified diabetic retinopathy without macular edema: Secondary | ICD-10-CM | POA: Insufficient documentation

## 2018-08-21 DIAGNOSIS — I132 Hypertensive heart and chronic kidney disease with heart failure and with stage 5 chronic kidney disease, or end stage renal disease: Secondary | ICD-10-CM | POA: Insufficient documentation

## 2018-08-21 DIAGNOSIS — Z87891 Personal history of nicotine dependence: Secondary | ICD-10-CM | POA: Diagnosis not present

## 2018-08-21 DIAGNOSIS — N186 End stage renal disease: Secondary | ICD-10-CM | POA: Diagnosis not present

## 2018-08-21 DIAGNOSIS — I509 Heart failure, unspecified: Secondary | ICD-10-CM | POA: Insufficient documentation

## 2018-08-21 LAB — GLUCOSE, CAPILLARY: Glucose-Capillary: 102 mg/dL — ABNORMAL HIGH (ref 70–99)

## 2018-08-21 MED ORDER — ONDANSETRON HCL 4 MG/2ML IJ SOLN
4.0000 mg | Freq: Once | INTRAMUSCULAR | Status: AC
  Administered 2018-08-21: 4 mg via INTRAVENOUS
  Filled 2018-08-21: qty 2

## 2018-08-21 MED ORDER — MORPHINE SULFATE (PF) 4 MG/ML IV SOLN
6.0000 mg | Freq: Once | INTRAVENOUS | Status: AC
  Administered 2018-08-21: 6 mg via INTRAMUSCULAR
  Filled 2018-08-21: qty 2

## 2018-08-21 MED ORDER — OXYCODONE-ACETAMINOPHEN 5-325 MG PO TABS: 1.0000 | ORAL_TABLET | ORAL | 0 refills | Status: AC | PRN

## 2018-08-21 MED ORDER — OXYCODONE-ACETAMINOPHEN 5-325 MG PO TABS
1.0000 | ORAL_TABLET | Freq: Once | ORAL | Status: AC
  Administered 2018-08-21: 1 via ORAL
  Filled 2018-08-21: qty 1

## 2018-08-21 MED ORDER — MORPHINE SULFATE (PF) 4 MG/ML IV SOLN
4.0000 mg | Freq: Once | INTRAVENOUS | Status: AC
  Administered 2018-08-21: 4 mg via INTRAVENOUS
  Filled 2018-08-21: qty 1

## 2018-08-21 NOTE — ED Notes (Signed)
Pt repositioned at this time, pt eating and drinking snack before discharge.

## 2018-08-21 NOTE — ED Notes (Addendum)
Pt ambulatory with 2 assist to wc, pt states uses walker at baseline. PT son in lobby. PT verbalizes d.c understanding

## 2018-08-21 NOTE — ED Provider Notes (Signed)
[facility] Emergency Department Provider Note  ____________________________________________   I have reviewed the triage vital signs and the nursing notes. Where available I have reviewed prior notes and, if possible and indicated, outside hospital notes.    HISTORY  Chief Complaint Leg Pain    HPI Laurie Steele is a 77 y.o. female  Is quite charming.  Has a host unfortunately of medical problems including end-stage renal disease, did have her dialysis yesterday fully, history of chronic low back pain, anxiety, right-sided hip pain in the past with negative x-rays, gout, neuropathy, presents today complaining of a pain that shoots all the way down from her hips to her foot on her right side.  It is worse with "pressure" she denies any numbness or weakness, she has no fever no chills no continence of bowel or bladder no back pain no fall or injury that she can recall.  Seems to be mostly in the hip area but then shoots all the way down her leg and back up.  Her foot does not hurt to touch.  She denies any fever or chills.  No nausea no vomiting no chest pain no abdominal pain.  Began gradually couple days ago has been steadily worsening.  Does think that she had something similar but in the past but cannot quite remember when or the details of it.  It is sharp lightning like discomfort which shoots down her leg and back up.    Past Medical History:  Diagnosis Date  . Anxiety   . CAD (coronary artery disease)   . Chronic anemia    Dr Inez Pilgrim  . Dysrhythmia   . GERD (gastroesophageal reflux disease)   . Glaucoma   . Gout   . Gout   . Hx of colonic polyps   . Hyperlipidemia   . Hypertension   . Lower back pain   . Miscarriage   . Myocardial infarction ([place])   . 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 Neuropathy   . 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 Peritoneal dialysis  status ([place])   . Renal failure    Dr Holley Raring  . Renal insufficiency   . 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 Vaginal delivery    x 4    Patient Active Problem List   Diagnosis Date Noted  . Decreased pedal pulses 07/03/2018  . Tremors of nervous system 04/08/2018  . Altered mental status 02/28/2018  . Complication of vascular access for dialysis 07/18/2017  . UTI (urinary tract infection) 01/22/2017  . CHF (congestive heart failure) ([place]) 12/11/2016  . Ds DNA antibody positive 07/09/2016  . Pneumonia 07/03/2016  . Anemia 06/17/2016  . Symptomatic anemia 06/15/2016  . Hypoglycemia 05/21/2016  . Chest pain 04/05/2016  . Hypercalcemia 03/14/2016  . Bronchitis 03/03/2012  . Type II or unspecified type diabetes mellitus with renal manifestations, not stated as uncontrolled(250.40) 12/18/2011  . END STAGE RENAL DISEASE 10/23/2010  . DM (diabetes mellitus), type 2 ([place]) 12/13/2009  . NEUROPATHY 12/13/2009  . GOUT 02/15/2009  . SLEEP DISORDER 02/15/2009  . GERD 02/23/2008  . Hyperlipidemia 05/14/2007  . ANEMIA, CHRONIC 05/14/2007  . Essential hypertension 05/14/2007    Past Surgical History:  Procedure Laterality Date  . ABDOMINAL HYSTERECTOMY  1983  . AV FISTULA PLACEMENT Left 09/21/2016   Procedure: ARTERIOVENOUS (AV) FISTULA CREATION ( BRACHIOCEPHALIC );  Surgeon: Katha Cabal, MD;  Location: [facility] ORS;  Service: Vascular;  Laterality: Left;  . BACK SURGERY  6/08   Dr Collier Salina  . BACK SURGERY  1987   disc  . CHOLECYSTECTOMY    . DIALYSIS/PERMA CATHETER REMOVAL N/A 02/25/2017   Procedure: Dialysis/Perma Catheter Removal;  Surgeon: Algernon Huxley, MD;  Location: [facility];  Service: Cardiovascular;  Laterality: N/A;  . INSERTION OF DIALYSIS CATHETER  2017  . PERITONEAL CATHETER INSERTION    . REMOVAL OF A DIALYSIS CATHETER N/A 09/21/2016   Procedure: REMOVAL OF A DIALYSIS CATHETER ( PERITONEAL DIALYSIS CATH );  Surgeon: Katha Cabal, MD;  Location: [facility] ORS;  Service: Vascular;  Laterality: N/A;  . VITRECTOMY AND CATARACT Left 11/04/2017   Procedure: VITRECTOMY AND CATARACT;  Surgeon: Leandrew Koyanagi, MD;  Location: [facility] ORS;  Service: Ophthalmology;  Laterality: Left;  US00:51.6 AP%21.8 DDU20.25 FLUID LOT #[id number] H    Prior to Admission medications   Medication Sig Start Date End Date Taking? Authorizing Provider  acetaminophen (TYLENOL) 650 MG CR tablet Take 650 mg by mouth every 8 (eight) hours as needed for pain.    [provider]  allopurinol (ZYLOPRIM) 100 MG tablet Take 200 mg by mouth at bedtime.    [provider]  amitriptyline (ELAVIL) 25 MG tablet Take 25 mg by mouth at bedtime as needed. for sleep 05/19/18   [provider]  amLODipine (NORVASC) 10 MG tablet Take 10 mg by mouth daily. 05/19/18   [provider]  ASPERCREME W/LIDOCAINE 4 % CREA Apply topically as needed 04/18/18   [provider]  aspirin EC 81 MG tablet Take 1 tablet (81 mg total) by mouth daily. 06/18/16   Fritzi Mandes, MD  brimonidine (ALPHAGAN) 0.2 % ophthalmic solution Apply to eye. 02/03/18   [provider]  cloNIDine (CATAPRES) 0.1 MG tablet Take 0.1 mg by mouth 2 (two) times daily. TAKE 0.1MG  BY MOUTH ON Sunday,TUESDAY,THURSDAY, AND SATURDAY 11/08/16   [provider]  clopidogrel (PLAVIX) 75 MG tablet Take 1 tablet (75 mg total) by mouth daily. 01/26/17   Nicholes Mango, MD  colchicine 0.6 MG tablet Take 0.5 tablets (0.3 mg total) by mouth 2 (two) times a week. 07/09/16   Demetrios Loll, MD  docusate sodium (COLACE) 100 MG capsule Take 1 capsule (100 mg total) by mouth daily as needed for mild constipation. 12/13/16   Loletha Grayer, MD  dorzolamide-timolol (COSOPT) 22.3-6.8 MG/ML ophthalmic solution INSTILL 1 DROP IN LEFT EYE TWICE A DAY 06/20/18   [provider]  famotidine (PEPCID) 20 MG tablet Take 20 mg by mouth at bedtime.  11/29/16   [provider]   feeding supplement, ENSURE ENLIVE, (ENSURE ENLIVE) LIQD Take 237 mLs by mouth 2 (two) times daily between meals. 01/25/17   Max Sane, MD  ferrous sulfate 325 (65 FE) MG tablet Take 1 tablet (325 mg total) by mouth daily with breakfast. 06/18/16   Fritzi Mandes, MD  fluticasone [facility]) 50 MCG/ACT nasal spray Place into the nose. 04/17/18   [provider]  furosemide (LASIX) 40 MG tablet Take 1 tablet (40 mg total) by mouth daily. 07/05/16   Demetrios Loll, MD  gabapentin (NEURONTIN) 300 MG capsule Take 300 mg by mouth 3 (three) times daily. 05/19/18   [provider]  glimepiride (AMARYL) 1 MG tablet Take 1 mg by mouth daily with breakfast.    [provider]  glipiZIDE (GLUCOTROL) 5 MG tablet Take 5 mg by mouth  daily. 05/19/18   [provider]  hydrALAZINE (APRESOLINE) 50 MG tablet Take 50 mg by mouth 2 (two) times daily. ONLY ON Tuesday,THURSDAY,SATURDAY ,AND SUNDAY 11/29/16   [provider]  [place] CREA Apply topically. 06/19/18   [provider]  insulin aspart (NOVOLOG) 100 UNIT/ML injection Inject 0-5 Units into the skin at bedtime. CBG < 70: implement hypoglycemia protocol  CBG 70 - 120: 0 units  CBG 121 - 150: 2 units  CBG 151 - 200: 3 units  CBG 201 - 250: 5 units  CBG 251 - 300: 8 units  CBG 301 - 350: 11 units  CBG 351 - 400: 15 units 03/04/18   Mody, Sital, MD  ipratropium (ATROVENT) 0.03 % nasal spray Place 1 spray into both nostrils 3 (three) times daily.     [provider]  ipratropium-albuterol (DUONEB) 0.5-2.5 (3) MG/3ML SOLN Take 3 mLs by nebulization every 6 (six) hours as needed. 12/14/16   Gladstone Lighter, MD  isosorbide mononitrate (IMDUR) 30 MG 24 hr tablet Take 30 mg by mouth 2 (two) times daily. 05/19/18   [provider]  latanoprost (XALATAN) 0.005 % ophthalmic solution Place 1 drop into both eyes at bedtime. 03/20/16   [provider]  metoprolol succinate (TOPROL-XL)  25 MG 24 hr tablet Take 1 tablet (25 mg total) by mouth at bedtime. 12/13/16   Loletha Grayer, MD  mirtazapine (REMERON) 15 MG tablet Take 15 mg by mouth at bedtime. 02/03/18   [provider]  multivitamin (RENA-VIT) TABS tablet Take 1 tablet by mouth daily.    [provider]  pantoprazole (PROTONIX) 40 MG tablet Take 1 tablet (40 mg total) by mouth daily. 06/18/16   Fritzi Mandes, MD  pravastatin (PRAVACHOL) 40 MG tablet Take 40 mg by mouth at bedtime.  06/21/15   [provider]  sevelamer carbonate (RENVELA) 800 MG tablet Take 800 mg by mouth 3 (three) times daily with meals.    [provider]  traMADol (ULTRAM) 50 MG tablet Take by mouth every 6 (six) hours as needed.    [provider]    Allergies Alprazolam  Family History  Problem Relation Age of Onset  . Heart attack Father   . Hypertension Mother   . Hyperlipidemia Mother   . Coronary artery disease Unknown        strong fam hx  . Kidney failure Brother   . Breast cancer Neg Hx     Social History Social History   Tobacco Use  . Smoking status: Former Smoker    Last attempt to quit: 11/05/1990    Years since quitting: 27.8  . Smokeless tobacco: Never Used  Substance Use Topics  . Alcohol use: No  . Drug use: No    Review of Systems Constitutional: No fever/chills Eyes: No visual changes. ENT: No sore throat. No stiff neck no neck pain Cardiovascular: Denies chest pain. Respiratory: Denies shortness of breath. Gastrointestinal:   no vomiting.  No diarrhea.  No constipation. Genitourinary: Negative for dysuria. Musculoskeletal: Negative lower extremity swelling Skin: Negative for rash. Neurological: Negative for severe headaches, focal weakness or numbness.   ____________________________________________   PHYSICAL EXAM:  VITAL SIGNS: ED Triage Vitals  Enc Vitals Group     BP 08/21/18 0601 (!) 190/73     Pulse Rate 08/21/18 0601 80     Resp 08/21/18 0601 18      Temp 08/21/18 0601 97.7 F (36.5 C)     Temp  Source 08/21/18 0601 Oral     SpO2 08/21/18 0601 94 %     Weight 08/21/18 0602 189 lb (85.7 kg)     Height 08/21/18 0602 5\' 7"  (1.702 m)     Head Circumference --      Peak Flow --      Pain Score 08/21/18 0601 10     Pain Loc --      Pain Edu? --      Excl. in [place]? --     Constitutional: Alert and oriented. Well appearing and in no acute distress. Eyes: Conjunctivae are normal Head: Atraumatic HEENT: No congestion/rhinnorhea. Mucous membranes are moist.  Oropharynx non-erythematous Neck:   Nontender with no meningismus, no masses, no stridor Cardiovascular: Normal rate, regular rhythm. Grossly normal heart sounds.  Good peripheral circulation. Respiratory: Normal respiratory effort.  No retractions. Lungs CTAB. Abdominal: Soft and nontender. No distention. No guarding no rebound Back:  There is no focal tenderness or step off.  there is no midline tenderness there are no lesions noted. there is no CVA tenderness Musculoskeletal: No lower extremity tenderness, symmetric distal pulses, can raise and flex and extend the leg without any evidence of discomfort no real pain to palpation in the foot knee ankle hip etc.  No aneurysmal swelling in the groin.  Full elevation about 50 degrees or more, patient has some discomfort which shoots down her leg she states.  No shingles are appreciated at this time.  No upper extremity tenderness. No joint effusions, no DVT signs strong distal pulses no edema Neurologic:  Normal speech and language. No gross focal neurologic deficits are appreciated.  Skin:  Skin is warm, dry and intact. No rash noted. Psychiatric: Mood and affect are normal. Speech and behavior are normal.  ____________________________________________   LABS (all labs ordered are listed, but only abnormal results are displayed)  Labs Reviewed - No data to display  Pertinent labs  results that were available during my care of the patient  were reviewed by me and considered in my medical decision making (see chart for details). ____________________________________________  EKG  I personally interpreted any EKGs ordered by me or triage  ____________________________________________  RADIOLOGY  Pertinent labs & imaging results that were available during my care of the patient were reviewed by me and considered in my medical decision making (see chart for details). If possible, patient and/or family made aware of any abnormal findings.  No results found. ____________________________________________    PROCEDURES  Procedure(s) performed: None  Procedures  Critical Care performed: None  ____________________________________________   INITIAL IMPRESSION / ASSESSMENT AND PLAN / ED COURSE  Pertinent labs & imaging results that were available during my care of the patient were reviewed by me and considered in my medical decision making (see chart for details).  She with atraumatic poorly reducible right sided shooting pain from her back on the way down her toes, most consistent with sciatica.  No evidence of ischemia, no evidence of trauma but will get an x-ray because I am not certain she is a completely reliable in terms of trauma.  Patient has fallen and injured the right side before.  No focal knee pain, nothing to suggest cauda equina syndrome nothing to suggest traumatic or vascular pathology on initial exam.  We will give her pain medication, most consistent with sciatica as this presentation she likely will require outpatient follow-up if we get her pain under better control    ____________________________________________   FINAL CLINICAL IMPRESSION(S) / ED  DIAGNOSES  Final diagnoses:  None      This chart was dictated using voice recognition software.  Despite best efforts to proofread,  errors can occur which can change meaning.      Schuyler Amor, MD 08/21/18 [phone]

## 2018-08-21 NOTE — ED Triage Notes (Signed)
Pt arrives to ED via [facility] from home with c/o right leg pain x2 days. No reported fall, injury or trauma. EMS states pt has appt. to be seen today for same. Pt reports 10/10 from hip to foot.

## 2018-08-21 NOTE — Discharge Instructions (Addendum)
If you have numbness, weakness or other concerns including increased pain, fever, or you feel worse in any way return to the emergency department.  Otherwise, please follow closely with your primary care doctor and orthopedic surgeon.  Do not drive on Percocet.  Take it with food.

## 2018-09-01 MED ORDER — CEFAZOLIN SODIUM-DEXTROSE 1-4 GM/50ML-% IV SOLN: 1.0000 g | Freq: Once | INTRAVENOUS | Status: DC

## 2018-09-02 ENCOUNTER — Ambulatory Visit: Admission: RE | Admit: 2018-09-02 | Payer: Medicare Other | Source: Ambulatory Visit | Admitting: Vascular Surgery

## 2018-09-02 ENCOUNTER — Encounter: Admission: RE | Payer: Self-pay | Source: Ambulatory Visit

## 2018-09-02 SURGERY — A/V FISTULAGRAM
Anesthesia: Moderate Sedation | Laterality: Left

## 2018-09-18 ENCOUNTER — Ambulatory Visit (INDEPENDENT_AMBULATORY_CARE_PROVIDER_SITE_OTHER): Payer: 59 | Admitting: Vascular Surgery

## 2018-09-18 ENCOUNTER — Encounter (INDEPENDENT_AMBULATORY_CARE_PROVIDER_SITE_OTHER): Payer: 59

## 2018-09-19 ENCOUNTER — Encounter (INDEPENDENT_AMBULATORY_CARE_PROVIDER_SITE_OTHER): Payer: 59 | Admitting: Vascular Surgery

## 2018-09-19 ENCOUNTER — Other Ambulatory Visit (INDEPENDENT_AMBULATORY_CARE_PROVIDER_SITE_OTHER): Payer: 59

## 2018-10-16 ENCOUNTER — Encounter (INDEPENDENT_AMBULATORY_CARE_PROVIDER_SITE_OTHER): Payer: 59

## 2018-10-16 ENCOUNTER — Ambulatory Visit (INDEPENDENT_AMBULATORY_CARE_PROVIDER_SITE_OTHER): Payer: 59 | Admitting: Nurse Practitioner

## 2018-11-24 ENCOUNTER — Other Ambulatory Visit: Payer: Self-pay

## 2018-11-24 ENCOUNTER — Inpatient Hospital Stay
Admission: EM | Admit: 2018-11-24 | Discharge: 2019-01-04 | DRG: 242 | Disposition: E | Payer: Medicare Other | Source: Ambulatory Visit | Attending: Family Medicine | Admitting: Family Medicine

## 2018-11-24 ENCOUNTER — Emergency Department: Payer: Medicare Other

## 2018-11-24 ENCOUNTER — Encounter: Payer: Self-pay | Admitting: Medical Oncology

## 2018-11-24 DIAGNOSIS — Z87440 Personal history of urinary (tract) infections: Secondary | ICD-10-CM

## 2018-11-24 DIAGNOSIS — N390 Urinary tract infection, site not specified: Secondary | ICD-10-CM | POA: Diagnosis not present

## 2018-11-24 DIAGNOSIS — A419 Sepsis, unspecified organism: Secondary | ICD-10-CM | POA: Diagnosis not present

## 2018-11-24 DIAGNOSIS — E1140 Type 2 diabetes mellitus with diabetic neuropathy, unspecified: Secondary | ICD-10-CM | POA: Diagnosis present

## 2018-11-24 DIAGNOSIS — I953 Hypotension of hemodialysis: Secondary | ICD-10-CM | POA: Diagnosis not present

## 2018-11-24 DIAGNOSIS — K219 Gastro-esophageal reflux disease without esophagitis: Secondary | ICD-10-CM | POA: Diagnosis present

## 2018-11-24 DIAGNOSIS — E876 Hypokalemia: Secondary | ICD-10-CM | POA: Diagnosis present

## 2018-11-24 DIAGNOSIS — E1122 Type 2 diabetes mellitus with diabetic chronic kidney disease: Secondary | ICD-10-CM | POA: Diagnosis present

## 2018-11-24 DIAGNOSIS — Z90710 Acquired absence of both cervix and uterus: Secondary | ICD-10-CM

## 2018-11-24 DIAGNOSIS — N2581 Secondary hyperparathyroidism of renal origin: Secondary | ICD-10-CM | POA: Diagnosis present

## 2018-11-24 DIAGNOSIS — M109 Gout, unspecified: Secondary | ICD-10-CM | POA: Diagnosis present

## 2018-11-24 DIAGNOSIS — E782 Mixed hyperlipidemia: Secondary | ICD-10-CM | POA: Diagnosis present

## 2018-11-24 DIAGNOSIS — Z87891 Personal history of nicotine dependence: Secondary | ICD-10-CM

## 2018-11-24 DIAGNOSIS — I442 Atrioventricular block, complete: Secondary | ICD-10-CM | POA: Diagnosis present

## 2018-11-24 DIAGNOSIS — D631 Anemia in chronic kidney disease: Secondary | ICD-10-CM | POA: Diagnosis present

## 2018-11-24 DIAGNOSIS — J690 Pneumonitis due to inhalation of food and vomit: Secondary | ICD-10-CM | POA: Diagnosis not present

## 2018-11-24 DIAGNOSIS — R001 Bradycardia, unspecified: Secondary | ICD-10-CM | POA: Diagnosis present

## 2018-11-24 DIAGNOSIS — R2689 Other abnormalities of gait and mobility: Secondary | ICD-10-CM | POA: Diagnosis not present

## 2018-11-24 DIAGNOSIS — Z794 Long term (current) use of insulin: Secondary | ICD-10-CM

## 2018-11-24 DIAGNOSIS — I469 Cardiac arrest, cause unspecified: Secondary | ICD-10-CM | POA: Diagnosis not present

## 2018-11-24 DIAGNOSIS — Z841 Family history of disorders of kidney and ureter: Secondary | ICD-10-CM

## 2018-11-24 DIAGNOSIS — N186 End stage renal disease: Secondary | ICD-10-CM | POA: Diagnosis present

## 2018-11-24 DIAGNOSIS — G92 Toxic encephalopathy: Secondary | ICD-10-CM | POA: Diagnosis not present

## 2018-11-24 DIAGNOSIS — I120 Hypertensive chronic kidney disease with stage 5 chronic kidney disease or end stage renal disease: Secondary | ICD-10-CM | POA: Diagnosis present

## 2018-11-24 DIAGNOSIS — I214 Non-ST elevation (NSTEMI) myocardial infarction: Secondary | ICD-10-CM | POA: Diagnosis not present

## 2018-11-24 DIAGNOSIS — Z7902 Long term (current) use of antithrombotics/antiplatelets: Secondary | ICD-10-CM

## 2018-11-24 DIAGNOSIS — I2510 Atherosclerotic heart disease of native coronary artery without angina pectoris: Secondary | ICD-10-CM | POA: Diagnosis present

## 2018-11-24 DIAGNOSIS — Z8249 Family history of ischemic heart disease and other diseases of the circulatory system: Secondary | ICD-10-CM

## 2018-11-24 DIAGNOSIS — Z9049 Acquired absence of other specified parts of digestive tract: Secondary | ICD-10-CM

## 2018-11-24 DIAGNOSIS — Z992 Dependence on renal dialysis: Secondary | ICD-10-CM

## 2018-11-24 DIAGNOSIS — J449 Chronic obstructive pulmonary disease, unspecified: Secondary | ICD-10-CM | POA: Diagnosis present

## 2018-11-24 DIAGNOSIS — Z950 Presence of cardiac pacemaker: Secondary | ICD-10-CM

## 2018-11-24 DIAGNOSIS — H409 Unspecified glaucoma: Secondary | ICD-10-CM | POA: Diagnosis present

## 2018-11-24 DIAGNOSIS — Z8349 Family history of other endocrine, nutritional and metabolic diseases: Secondary | ICD-10-CM

## 2018-11-24 DIAGNOSIS — Z888 Allergy status to other drugs, medicaments and biological substances status: Secondary | ICD-10-CM

## 2018-11-24 DIAGNOSIS — Z7951 Long term (current) use of inhaled steroids: Secondary | ICD-10-CM

## 2018-11-24 DIAGNOSIS — Z79899 Other long term (current) drug therapy: Secondary | ICD-10-CM

## 2018-11-24 DIAGNOSIS — E11319 Type 2 diabetes mellitus with unspecified diabetic retinopathy without macular edema: Secondary | ICD-10-CM | POA: Diagnosis present

## 2018-11-24 DIAGNOSIS — J189 Pneumonia, unspecified organism: Secondary | ICD-10-CM

## 2018-11-24 DIAGNOSIS — Z7982 Long term (current) use of aspirin: Secondary | ICD-10-CM

## 2018-11-24 DIAGNOSIS — Z9842 Cataract extraction status, left eye: Secondary | ICD-10-CM

## 2018-11-24 DIAGNOSIS — I252 Old myocardial infarction: Secondary | ICD-10-CM

## 2018-11-24 LAB — BASIC METABOLIC PANEL
Anion gap: 12 (ref 5–15)
BUN: 31 mg/dL — ABNORMAL HIGH (ref 8–23)
CALCIUM: 9.2 mg/dL (ref 8.9–10.3)
CHLORIDE: 95 mmol/L — AB (ref 98–111)
CO2: 33 mmol/L — ABNORMAL HIGH (ref 22–32)
CREATININE: 4.46 mg/dL — AB (ref 0.44–1.00)
GFR calc Af Amer: 10 mL/min — ABNORMAL LOW (ref >60)
GFR calc non Af Amer: 9 mL/min — ABNORMAL LOW (ref >60)
Glucose, Bld: 148 mg/dL — ABNORMAL HIGH (ref 70–99)
Potassium: 3 mmol/L — ABNORMAL LOW (ref 3.5–5.1)
SODIUM: 140 mmol/L (ref 135–145)

## 2018-11-24 LAB — GLUCOSE, CAPILLARY: Glucose-Capillary: 175 mg/dL — ABNORMAL HIGH (ref 70–99)

## 2018-11-24 LAB — CBC
HCT: 34.5 % — ABNORMAL LOW (ref 36.0–46.0)
HEMOGLOBIN: 10.9 g/dL — AB (ref 12.0–15.0)
MCH: 35.2 pg — ABNORMAL HIGH (ref 26.0–34.0)
MCHC: 31.6 g/dL (ref 30.0–36.0)
MCV: 111.3 fL — ABNORMAL HIGH (ref 80.0–100.0)
Platelets: 182 10*3/uL (ref 150–400)
RBC: 3.1 MIL/uL — ABNORMAL LOW (ref 3.87–5.11)
RDW: 15.6 % — AB (ref 11.5–15.5)
WBC: 8.1 10*3/uL (ref 4.0–10.5)
nRBC: 0 % (ref 0.0–0.2)

## 2018-11-24 LAB — TROPONIN I: TROPONIN I: 0.04 ng/mL — AB (ref <0.03)

## 2018-11-24 LAB — MAGNESIUM: MAGNESIUM: 2.2 mg/dL (ref 1.7–2.4)

## 2018-11-24 MED ORDER — INSULIN ASPART 100 UNIT/ML ~~LOC~~ SOLN
0.0000 [IU] | Freq: Three times a day (TID) | SUBCUTANEOUS | Status: DC
  Administered 2018-11-25: 2 [IU] via SUBCUTANEOUS
  Administered 2018-11-26: 1 [IU] via SUBCUTANEOUS
  Administered 2018-11-27: 3 [IU] via SUBCUTANEOUS
  Administered 2018-11-27 – 2018-11-28 (×3): 1 [IU] via SUBCUTANEOUS
  Administered 2018-11-29: 2 [IU] via SUBCUTANEOUS
  Administered 2018-11-29 – 2018-11-30 (×3): 1 [IU] via SUBCUTANEOUS
  Administered 2018-12-02 – 2018-12-03 (×2): 3 [IU] via SUBCUTANEOUS
  Administered 2018-12-04: 1 [IU] via SUBCUTANEOUS
  Administered 2018-12-04: 2 [IU] via SUBCUTANEOUS
  Administered 2018-12-04 – 2018-12-05 (×2): 1 [IU] via SUBCUTANEOUS
  Administered 2018-12-05 – 2018-12-06 (×2): 2 [IU] via SUBCUTANEOUS
  Filled 2018-11-24 (×18): qty 1

## 2018-11-24 MED ORDER — HYDRALAZINE HCL 50 MG PO TABS
50.0000 mg | ORAL_TABLET | ORAL | Status: DC
  Administered 2018-11-25 – 2018-12-02 (×7): 50 mg via ORAL
  Filled 2018-11-24 (×7): qty 1

## 2018-11-24 MED ORDER — DOCUSATE SODIUM 100 MG PO CAPS: 100.0000 mg | ORAL_CAPSULE | Freq: Every day | ORAL | Status: DC | PRN

## 2018-11-24 MED ORDER — HYDROCODONE-ACETAMINOPHEN 5-325 MG PO TABS
1.0000 | ORAL_TABLET | ORAL | Status: DC | PRN
  Administered 2018-12-02 – 2018-12-03 (×2): 2 via ORAL
  Filled 2018-11-24 (×2): qty 2

## 2018-11-24 MED ORDER — SODIUM CHLORIDE 0.9% FLUSH
3.0000 mL | Freq: Two times a day (BID) | INTRAVENOUS | Status: DC
  Administered 2018-11-24 – 2018-12-06 (×19): 3 mL via INTRAVENOUS

## 2018-11-24 MED ORDER — ONDANSETRON HCL 4 MG/2ML IJ SOLN
4.0000 mg | Freq: Four times a day (QID) | INTRAMUSCULAR | Status: DC | PRN
  Filled 2018-11-24: qty 2

## 2018-11-24 MED ORDER — SENNOSIDES-DOCUSATE SODIUM 8.6-50 MG PO TABS
1.0000 | ORAL_TABLET | Freq: Every evening | ORAL | Status: DC | PRN
  Administered 2018-11-28: 1 via ORAL
  Filled 2018-11-24: qty 1

## 2018-11-24 MED ORDER — BISACODYL 5 MG PO TBEC: 5.0000 mg | DELAYED_RELEASE_TABLET | Freq: Every day | ORAL | Status: DC | PRN

## 2018-11-24 MED ORDER — PANTOPRAZOLE SODIUM 40 MG PO TBEC
40.0000 mg | DELAYED_RELEASE_TABLET | Freq: Every day | ORAL | Status: DC
  Administered 2018-11-25: 40 mg via ORAL
  Filled 2018-11-24: qty 1

## 2018-11-24 MED ORDER — ALBUTEROL SULFATE (2.5 MG/3ML) 0.083% IN NEBU: 2.5000 mg | INHALATION_SOLUTION | RESPIRATORY_TRACT | Status: DC | PRN

## 2018-11-24 MED ORDER — HEPARIN SODIUM (PORCINE) 5000 UNIT/ML IJ SOLN
5000.0000 [IU] | Freq: Three times a day (TID) | INTRAMUSCULAR | Status: DC
  Administered 2018-11-24 – 2018-12-03 (×23): 5000 [IU] via SUBCUTANEOUS
  Filled 2018-11-24 (×25): qty 1

## 2018-11-24 MED ORDER — IPRATROPIUM BROMIDE 0.03 % NA SOLN
1.0000 | Freq: Three times a day (TID) | NASAL | Status: DC | PRN
  Filled 2018-11-24: qty 30

## 2018-11-24 MED ORDER — POTASSIUM CHLORIDE CRYS ER 20 MEQ PO TBCR
40.0000 meq | EXTENDED_RELEASE_TABLET | Freq: Once | ORAL | Status: AC
  Administered 2018-11-24: 40 meq via ORAL
  Filled 2018-11-24: qty 2

## 2018-11-24 MED ORDER — ASPIRIN EC 325 MG PO TBEC
325.0000 mg | DELAYED_RELEASE_TABLET | Freq: Every day | ORAL | Status: DC
  Administered 2018-11-25 – 2018-12-04 (×9): 325 mg via ORAL
  Filled 2018-11-24 (×11): qty 1

## 2018-11-24 MED ORDER — LATANOPROST 0.005 % OP SOLN
1.0000 [drp] | Freq: Every day | OPHTHALMIC | Status: DC
  Administered 2018-11-24 – 2018-12-05 (×11): 1 [drp] via OPHTHALMIC
  Filled 2018-11-24: qty 2.5

## 2018-11-24 MED ORDER — FAMOTIDINE 20 MG PO TABS
20.0000 mg | ORAL_TABLET | Freq: Every day | ORAL | Status: DC
  Administered 2018-11-24 – 2018-12-03 (×10): 20 mg via ORAL
  Filled 2018-11-24 (×11): qty 1

## 2018-11-24 MED ORDER — SEVELAMER CARBONATE 800 MG PO TABS
800.0000 mg | ORAL_TABLET | Freq: Three times a day (TID) | ORAL | Status: DC
  Administered 2018-11-25: 800 mg via ORAL
  Filled 2018-11-24: qty 1

## 2018-11-24 MED ORDER — ALLOPURINOL 100 MG PO TABS
200.0000 mg | ORAL_TABLET | Freq: Every day | ORAL | Status: DC
  Administered 2018-11-24 – 2018-12-03 (×10): 200 mg via ORAL
  Filled 2018-11-24 (×13): qty 2

## 2018-11-24 MED ORDER — INSULIN ASPART 100 UNIT/ML ~~LOC~~ SOLN: 0.0000 [IU] | Freq: Every day | SUBCUTANEOUS | Status: DC

## 2018-11-24 MED ORDER — ONDANSETRON HCL 4 MG PO TABS: 4.0000 mg | ORAL_TABLET | Freq: Four times a day (QID) | ORAL | Status: DC | PRN

## 2018-11-24 MED ORDER — GABAPENTIN 300 MG PO CAPS
300.0000 mg | ORAL_CAPSULE | Freq: Three times a day (TID) | ORAL | Status: DC
  Administered 2018-11-24 – 2018-11-25 (×2): 300 mg via ORAL
  Filled 2018-11-24 (×2): qty 1

## 2018-11-24 MED ORDER — CLOPIDOGREL BISULFATE 75 MG PO TABS
75.0000 mg | ORAL_TABLET | Freq: Every day | ORAL | Status: DC
  Administered 2018-11-25 – 2018-11-26 (×2): 75 mg via ORAL
  Filled 2018-11-24 (×2): qty 1

## 2018-11-24 MED ORDER — PRAVASTATIN SODIUM 40 MG PO TABS
40.0000 mg | ORAL_TABLET | Freq: Every day | ORAL | Status: DC
  Administered 2018-11-24 – 2018-12-03 (×10): 40 mg via ORAL
  Filled 2018-11-24 (×11): qty 1

## 2018-11-24 MED ORDER — ACETAMINOPHEN 325 MG PO TABS
650.0000 mg | ORAL_TABLET | Freq: Four times a day (QID) | ORAL | Status: DC | PRN
  Administered 2018-11-28: 650 mg via ORAL
  Filled 2018-11-24: qty 2

## 2018-11-24 MED ORDER — FERROUS SULFATE 325 (65 FE) MG PO TABS
325.0000 mg | ORAL_TABLET | Freq: Every day | ORAL | Status: DC
  Administered 2018-11-25 – 2018-12-04 (×9): 325 mg via ORAL
  Filled 2018-11-24 (×9): qty 1

## 2018-11-24 MED ORDER — FUROSEMIDE 40 MG PO TABS
40.0000 mg | ORAL_TABLET | Freq: Every day | ORAL | Status: DC
  Administered 2018-11-25 – 2018-12-02 (×8): 40 mg via ORAL
  Filled 2018-11-24 (×10): qty 1

## 2018-11-24 MED ORDER — SODIUM CHLORIDE 0.9% FLUSH: 3.0000 mL | INTRAVENOUS | Status: DC | PRN

## 2018-11-24 MED ORDER — MIRTAZAPINE 15 MG PO TABS
15.0000 mg | ORAL_TABLET | Freq: Every day | ORAL | Status: DC
  Administered 2018-11-24 – 2018-12-03 (×9): 15 mg via ORAL
  Filled 2018-11-24 (×10): qty 1

## 2018-11-24 MED ORDER — ACETAMINOPHEN 650 MG RE SUPP: 650.0000 mg | Freq: Four times a day (QID) | RECTAL | Status: DC | PRN

## 2018-11-24 MED ORDER — SODIUM CHLORIDE 0.9 % IV SOLN
250.0000 mL | INTRAVENOUS | Status: DC | PRN
  Administered 2018-12-01: 500 mL via INTRAVENOUS
  Administered 2018-12-01: 12:00:00 via INTRAVENOUS
  Administered 2018-12-04: 250 mL via INTRAVENOUS

## 2018-11-24 MED ORDER — ENSURE ENLIVE PO LIQD
237.0000 mL | Freq: Two times a day (BID) | ORAL | Status: DC
  Administered 2018-11-25 – 2018-12-04 (×8): 237 mL via ORAL

## 2018-11-24 MED ORDER — AMITRIPTYLINE HCL 25 MG PO TABS: 25.0000 mg | ORAL_TABLET | Freq: Every evening | ORAL | Status: DC | PRN

## 2018-11-24 MED ORDER — RENA-VITE PO TABS
1.0000 | ORAL_TABLET | Freq: Every day | ORAL | Status: DC
  Administered 2018-11-25 – 2018-12-04 (×8): 1 via ORAL
  Filled 2018-11-24 (×13): qty 1

## 2018-11-24 NOTE — ED Notes (Signed)
Date and time results received: 11/25/2018   Test: troponin  Critical Value: 0.04  Name of Provider Notified: Quale MD  Orders Received? Or Actions Taken?: MD notified

## 2018-11-24 NOTE — Consult Note (Signed)
[facility] Cardiology Consultation Note  Patient ID: Laurie Steele, MRN: [id number], DOB/AGE: 79-Mar-1942 79 y.o. Admit date: 11/23/2018   Date of Consult: 11/09/2018 Primary Physician: [name], MD Primary Cardiologist: Ubaldo Glassing  Chief Complaint:  Chief Complaint  Patient presents with  . Bradycardia  . Weakness   Reason for Consult: Heart block  HPI: 78 y.o. female with no known apparent coronary atherosclerosis status post previous history of myocardial infarction with mild LV systolic dysfunction with apparent mild anterior hypokinesis with ejection fraction of 40%.  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  Patient remains with a fistula on her left arm and has been working with dialysis and doing well.  Recently she has been more weak fatigued and unable to do the things she wishes.  There is been no evidence of chest pain or heart failure.  The patient now comes in the emergency room after having very slow heart rate at dialysis.  The patient was found to have heart block.  There are multiple strips and EKG suggesting second-degree AV block with 2-1 block and frequent preventricular contractions.  Is not clear whether she has had some third-degree block with this.  The patient has variable heart rate and is currently anywhere from 33 bpm to 60 bpm.  The patient has not had any syncope dizziness nausea or diaphoresis.  Currently the patient is on multiple medications potentially causing AV node block including metoprolol and clonidine.  We have suggested that with no evidence of significant major symptoms at this time at rest and lying that she would be able to stop her medication management including clonidine and metoprolol and follow for improvements.  If not there is potential for further evaluation and treatment options for possible pacemaker placement  Past Medical History:   Diagnosis Date  . Anxiety   . CAD (coronary artery disease)   . Chronic anemia    Dr Inez Pilgrim  . Dysrhythmia   . GERD (gastroesophageal reflux disease)   . Glaucoma   . Gout   . Gout   . Hx of colonic polyps   . Hyperlipidemia   . Hypertension   . Lower back pain   . Miscarriage   . Myocardial infarction ([place])   . 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 Neuropathy   . 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 Peritoneal dialysis status ([place])   . Renal failure    Dr Holley Raring  . Renal insufficiency   . 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 Vaginal delivery    x 4      Surgical History:  Past Surgical History:  Procedure Laterality Date  . ABDOMINAL HYSTERECTOMY  1983  . AV FISTULA PLACEMENT Left 09/21/2016   Procedure: ARTERIOVENOUS (AV) FISTULA CREATION ( BRACHIOCEPHALIC );  Surgeon: Katha Cabal, MD;  Location: [facility] ORS;  Service: Vascular;  Laterality: Left;  . BACK SURGERY  6/08   Dr Collier Salina  . BACK SURGERY  1987   disc  . CHOLECYSTECTOMY    . DIALYSIS/PERMA CATHETER REMOVAL N/A 02/25/2017   Procedure: Dialysis/Perma Catheter Removal;  Surgeon: Algernon Huxley, MD;  Location: [facility];  Service: Cardiovascular;  Laterality: N/A;  . INSERTION OF DIALYSIS CATHETER  2017  . PERITONEAL CATHETER INSERTION    . REMOVAL OF A  DIALYSIS CATHETER N/A 09/21/2016   Procedure: REMOVAL OF A DIALYSIS CATHETER ( PERITONEAL DIALYSIS CATH );  Surgeon: Katha Cabal, MD;  Location: [facility] ORS;  Service: Vascular;  Laterality: N/A;  . VITRECTOMY AND CATARACT Left 11/04/2017   Procedure: VITRECTOMY AND CATARACT;  Surgeon: Leandrew Koyanagi, MD;  Location: [facility] ORS;  Service: Ophthalmology;  Laterality: Left;  US00:51.6 AP%21.8 WGN56.21 FLUID LOT #[id number]     Home Meds: Prior to Admission medications   Medication Sig Start Date End Date Taking? Authorizing Provider   acetaminophen (TYLENOL) 650 MG CR tablet Take 650 mg by mouth every 8 (eight) hours as needed for pain.    [provider]  allopurinol (ZYLOPRIM) 100 MG tablet Take 200 mg by mouth at bedtime.    [provider]  amitriptyline (ELAVIL) 25 MG tablet Take 25 mg by mouth at bedtime as needed. for sleep 05/19/18   [provider]  amLODipine (NORVASC) 10 MG tablet Take 10 mg by mouth daily. 05/19/18   [provider]  ASPERCREME W/LIDOCAINE 4 % CREA Apply topically as needed 04/18/18   [provider]  aspirin EC 81 MG tablet Take 1 tablet (81 mg total) by mouth daily. 06/18/16   Fritzi Mandes, MD  brimonidine (ALPHAGAN) 0.2 % ophthalmic solution Apply to eye. 02/03/18   [provider]  cloNIDine (CATAPRES) 0.1 MG tablet Take 0.1 mg by mouth 2 (two) times daily. TAKE 0.1MG  BY MOUTH ON Sunday,TUESDAY,THURSDAY, AND SATURDAY 11/08/16   [provider]  clopidogrel (PLAVIX) 75 MG tablet Take 1 tablet (75 mg total) by mouth daily. 01/26/17   Nicholes Mango, MD  colchicine 0.6 MG tablet Take 0.5 tablets (0.3 mg total) by mouth 2 (two) times a week. 07/09/16   Demetrios Loll, MD  docusate sodium (COLACE) 100 MG capsule Take 1 capsule (100 mg total) by mouth daily as needed for mild constipation. 12/13/16   Loletha Grayer, MD  dorzolamide-timolol (COSOPT) 22.3-6.8 MG/ML ophthalmic solution INSTILL 1 DROP IN LEFT EYE TWICE A DAY 06/20/18   [provider]  famotidine (PEPCID) 20 MG tablet Take 20 mg by mouth at bedtime.  11/29/16   [provider]  feeding supplement, ENSURE ENLIVE, (ENSURE ENLIVE) LIQD Take 237 mLs by mouth 2 (two) times daily between meals. 01/25/17   Max Sane, MD  ferrous sulfate 325 (65 FE) MG tablet Take 1 tablet (325 mg total) by mouth daily with breakfast. 06/18/16   Fritzi Mandes, MD  fluticasone [facility]) 50 MCG/ACT nasal spray Place into the nose. 04/17/18   [provider]  furosemide (LASIX) 40 MG tablet Take 1  tablet (40 mg total) by mouth daily. 07/05/16   Demetrios Loll, MD  gabapentin (NEURONTIN) 300 MG capsule Take 300 mg by mouth 3 (three) times daily. 05/19/18   [provider]  glimepiride (AMARYL) 1 MG tablet Take 1 mg by mouth daily with breakfast.    [provider]  glipiZIDE (GLUCOTROL) 5 MG tablet Take 5 mg by mouth daily. 05/19/18   [provider]  hydrALAZINE (APRESOLINE) 50 MG tablet Take 50 mg by mouth 2 (two) times daily. ONLY ON Tuesday,THURSDAY,SATURDAY ,AND SUNDAY 11/29/16   [provider]  [place] CREA Apply topically. 06/19/18   [provider]  insulin aspart (NOVOLOG) 100 UNIT/ML injection Inject 0-5 Units into the skin at bedtime. CBG < 70: implement hypoglycemia protocol  CBG 70 - 120: 0 units  CBG 121 - 150: 2 units  CBG 151 - 200:  3 units  CBG 201 - 250: 5 units  CBG 251 - 300: 8 units  CBG 301 - 350: 11 units  CBG 351 - 400: 15 units 03/04/18   Mody, Sital, MD  ipratropium (ATROVENT) 0.03 % nasal spray Place 1 spray into both nostrils 3 (three) times daily.     [provider]  ipratropium-albuterol (DUONEB) 0.5-2.5 (3) MG/3ML SOLN Take 3 mLs by nebulization every 6 (six) hours as needed. 12/14/16   Gladstone Lighter, MD  isosorbide mononitrate (IMDUR) 30 MG 24 hr tablet Take 30 mg by mouth 2 (two) times daily. 05/19/18   [provider]  latanoprost (XALATAN) 0.005 % ophthalmic solution Place 1 drop into both eyes at bedtime. 03/20/16   [provider]  metoprolol succinate (TOPROL-XL) 25 MG 24 hr tablet Take 1 tablet (25 mg total) by mouth at bedtime. 12/13/16   Loletha Grayer, MD  mirtazapine (REMERON) 15 MG tablet Take 15 mg by mouth at bedtime. 02/03/18   [provider]  multivitamin (RENA-VIT) TABS tablet Take 1 tablet by mouth daily.    [provider]  oxyCODONE-acetaminophen (PERCOCET) 5-325 MG tablet Take 1 tablet by mouth every 4 (four) hours as needed for  severe pain. 08/21/18   Schuyler Amor, MD  pantoprazole (PROTONIX) 40 MG tablet Take 1 tablet (40 mg total) by mouth daily. 06/18/16   Fritzi Mandes, MD  pravastatin (PRAVACHOL) 40 MG tablet Take 40 mg by mouth at bedtime.  06/21/15   [provider]  sevelamer carbonate (RENVELA) 800 MG tablet Take 800 mg by mouth 3 (three) times daily with meals.    [provider]  traMADol (ULTRAM) 50 MG tablet Take by mouth every 6 (six) hours as needed.    [provider]    Inpatient Medications:     Allergies:  Allergies  Allergen Reactions  . Alprazolam Other (See Comments)    Unable to sleep and confusion    Social History   Socioeconomic History  . Marital status: Widowed    Spouse name: Not on file  . Number of children: 4  . Years of education: Not on file  . Highest education level: Not on file  Occupational History  . Occupation: retired    Fish farm manager: RETIRED    Comment: [employer]  Social Needs  . Financial resource strain: Not on file  . Food insecurity:    Worry: Not on file    Inability: Not on file  . Transportation needs:    Medical: Not on file    Non-medical: Not on file  Tobacco Use  . Smoking status: Former Smoker    Last attempt to quit: 11/05/1990    Years since quitting: 28.0  . Smokeless tobacco: Never Used  Substance and Sexual Activity  . Alcohol use: No  . Drug use: No  . Sexual activity: Not Currently  Lifestyle  . Physical activity:    Days per week: Not on file    Minutes per session: Not on file  . Stress: Not on file  Relationships  . Social connections:    Talks on phone: Not on file    Gets together: Not on file    Attends religious service: Not on file    Active member of club or organization: Not on file    Attends meetings of clubs or organizations: Not on file    Relationship status: Not on file  . Intimate partner violence:    Fear of current or  ex partner: Not on file    Emotionally abused: Not  on file    Physically abused: Not on file    Forced sexual activity: Not on file  Other Topics Concern  . Not on file  Social History Narrative   Lives at home with son. Has a walker. Dependent for dome daily activities     Family History  Problem Relation Age of Onset  . Heart attack Father   . Hypertension Mother   . Hyperlipidemia Mother   . Coronary artery disease Other        strong fam hx  . Kidney failure Brother   . Breast cancer Neg Hx      Review of Systems Positive for weakness fatigue Negative for: General:  chills, fever, night sweats or weight changes.  Cardiovascular: PND orthopnea syncope dizziness  Dermatological skin lesions rashes Respiratory: Cough congestion Urologic: Frequent urination urination at night and hematuria Abdominal: negative for nausea, vomiting, diarrhea, bright red blood per rectum, melena, or hematemesis Neurologic: negative for visual changes, and/or hearing changes  All other systems reviewed and are otherwise negative except as noted above.  Labs: No results for input(s): CKTOTAL, CKMB, TROPONINI in the last 72 hours. Lab Results  Component Value Date   WBC 8.1 11/23/2018   HGB 10.9 (L) 11/25/2018   HCT 34.5 (L) 11/30/2018   MCV 111.3 (H) 11/09/2018   PLT 182 11/19/2018   No results for input(s): NA, K, CL, CO2, BUN, CREATININE, CALCIUM, PROT, BILITOT, ALKPHOS, ALT, AST, GLUCOSE in the last 168 hours.  Invalid input(s): LABALBU Lab Results  Component Value Date   CHOL 156 01/25/2017   HDL 37 (L) 01/25/2017   LDLCALC 88 01/25/2017   TRIG 155 (H) 01/25/2017   No results found for: DDIMER  Radiology/Studies:  Dg Chest Port 1 View  Result Date: 11/23/2018 CLINICAL DATA:  Hemodialysis completed with bradycardia. EXAM: PORTABLE CHEST 1 VIEW COMPARISON:  02/27/2018 FINDINGS: Lungs are hypoinflated without focal airspace consolidation or effusion. Cardiomediastinal silhouette and remainder of the exam is unchanged. IMPRESSION:  Hypoinflation without acute cardiopulmonary disease. Electronically Signed   By: Marin Olp M.D.   On: 11/28/2018 17:09    EKG: Normal sinus rhythm with second-degree heart block with 2-1 block and frequent preventricular contractions  Weights: Filed Weights   11/22/2018 1636  Weight: 85 kg     Physical Exam: Blood pressure (!) 147/48, pulse 70, temperature 97.8 F (36.6 C), temperature source Oral, resp. rate 13, height 5\' 7"  (1.702 m), weight 85 kg, SpO2 100 %. Body mass index is 29.35 kg/m. General: Well developed, well nourished, in no acute distress. Head eyes ears nose throat: Normocephalic, atraumatic, sclera non-icteric, no xanthomas, nares are without discharge. No apparent thyromegaly and/or mass  Lungs: Normal respiratory effort.  Few wheezes, no rales, no rhonchi.  Heart: RRR with normal S1 S2. no murmur gallop, no rub, PMI is normal size and placement, carotid upstroke normal without bruit, jugular venous pressure is normal Abdomen: Soft, non-tender, non-distended with normoactive bowel sounds. No hepatomegaly. No rebound/guarding. No obvious abdominal masses. Abdominal aorta is normal size without bruit Extremities: Trace edema. no cyanosis, no clubbing, no ulcers left upper arm fistula Peripheral : 2+ bilateral upper extremity pulses, 2+ bilateral femoral pulses, 2+ bilateral dorsal pedal pulse Neuro: Alert and oriented. No facial asymmetry. No focal deficit. Moves all extremities spontaneously. Musculoskeletal: Normal muscle tone without kyphosis Psych:  Responds to questions appropriately with a normal affect.    Assessment: 78 year old female  with hypertension hyperlipidemia coronary artery disease LV systolic dysfunction chronic kidney disease 5 with appropriate medication management having second-degree heart block with 2-1 block and bradycardia causing weakness and fatigue but no evidence of syncope dizziness or nausea or myocardial infarction or congestive heart  failure  Plan: 1.  Discontinuation of clonidine and metoprolol due to significant heart block and concerns for symptomatic bradycardia 2.  Continue to monitor with telemetry following for any worsening or complete heart block requiring further intervention including pacemaker placement 3.  Echocardiogram for LV systolic dysfunction valvular heart disease contributing to above 4.  Serial ECG and enzymes to assess for possible myocardial infarction and or ischemia 5.  Further diagnostic testing and treatment options after above  Signed, Corey Skains M.D. [facility] 11/06/2018, 5:36 PM

## 2018-11-24 NOTE — H&P (Signed)
[facility] [place] NAME: Laurie Steele    MR#:  [id number]  DATE OF BIRTH:  May 29, 1941  DATE OF ADMISSION:  11/07/2018  PRIMARY CARE PHYSICIAN: [name], MD   REQUESTING/REFERRING PHYSICIAN: Dr. Jacqualine Code.  CHIEF COMPLAINT:   Chief Complaint  Patient presents with  . Bradycardia  . Weakness   Dizziness and weakness. HISTORY OF PRESENT ILLNESS:  Laurie Steele  is a 78 y.o. female with a known history of multiple medical problems including CAD, chronic anemia, gout, hypertension, hyperlipidemia, MI, ESRD on hemodialysis, diabetes, GERD and etc.  The patient feels dizzy and weak from time to time for long time but worsening during dialysis today.  She was found bradycardia and sent to ED for further evaluation.  She is found severe bradycardia from 30 to 50s.  ED physician request cardiology consult and admission.  The patient denies any other symptoms. PAST MEDICAL HISTORY:   Past Medical History:  Diagnosis Date  . Anxiety   . CAD (coronary artery disease)   . Chronic anemia    Dr Inez Pilgrim  . Dysrhythmia   . GERD (gastroesophageal reflux disease)   . Glaucoma   . Gout   . Gout   . Hx of colonic polyps   . Hyperlipidemia   . Hypertension   . Lower back pain   . Miscarriage   . Myocardial infarction ([place])   . 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 Neuropathy   . 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 Peritoneal dialysis status ([place])   . Renal failure    Dr Holley Raring  . Renal insufficiency   . 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 Vaginal delivery    x 4    PAST SURGICAL HISTORY:   Past Surgical History:  Procedure Laterality Date  . ABDOMINAL HYSTERECTOMY  1983  . AV FISTULA PLACEMENT Left 09/21/2016   Procedure: ARTERIOVENOUS (AV) FISTULA CREATION ( BRACHIOCEPHALIC );  Surgeon: Katha Cabal, MD;  Location:  [facility] ORS;  Service: Vascular;  Laterality: Left;  . BACK SURGERY  6/08   Dr Collier Salina  . BACK SURGERY  1987   disc  . CHOLECYSTECTOMY    . DIALYSIS/PERMA CATHETER REMOVAL N/A 02/25/2017   Procedure: Dialysis/Perma Catheter Removal;  Surgeon: Algernon Huxley, MD;  Location: [place] CV LAB;  Service: Cardiovascular;  Laterality: N/A;  . INSERTION OF DIALYSIS CATHETER  2017  . PERITONEAL CATHETER INSERTION    . REMOVAL OF A DIALYSIS CATHETER N/A 09/21/2016   Procedure: REMOVAL OF A DIALYSIS CATHETER ( PERITONEAL DIALYSIS CATH );  Surgeon: Katha Cabal, MD;  Location: [facility] ORS;  Service: Vascular;  Laterality: N/A;  . VITRECTOMY AND CATARACT Left 11/04/2017   Procedure: VITRECTOMY AND CATARACT;  Surgeon: Leandrew Koyanagi, MD;  Location: [facility] ORS;  Service: Ophthalmology;  Laterality: Left;  US00:51.6 AP%21.8 JFH54.56 FLUID LOT #[id number]    SOCIAL HISTORY:   Social History   Tobacco Use  . Smoking status: Former Smoker    Last attempt to quit: 11/05/1990    Years since quitting: 28.0  . Smokeless tobacco: Never Used  Substance Use Topics  . Alcohol use: No    FAMILY HISTORY:   Family History  Problem Relation Age of Onset  . Heart attack Father   . Hypertension Mother   . Hyperlipidemia Mother   .  Coronary artery disease Other        strong fam hx  . Kidney failure Brother   . Breast cancer Neg Hx     DRUG ALLERGIES:   Allergies  Allergen Reactions  . Alprazolam Other (See Comments)    Unable to sleep and confusion    REVIEW OF SYSTEMS:   Review of Systems  Constitutional: Positive for malaise/fatigue. Negative for chills and fever.  HENT: Negative for sore throat.   Eyes: Negative for blurred vision and double vision.  Respiratory: Negative for cough, hemoptysis, shortness of breath, wheezing and stridor.   Cardiovascular: Negative for chest pain, palpitations, orthopnea and leg swelling.  Gastrointestinal: Negative for abdominal pain, blood in stool,  diarrhea, melena, nausea and vomiting.  Genitourinary: Negative for dysuria, flank pain and hematuria.  Musculoskeletal: Negative for back pain and joint pain.  Skin: Negative for rash.  Neurological: Positive for dizziness. Negative for tingling, tremors, sensory change, focal weakness, seizures, loss of consciousness, weakness and headaches.  Endo/Heme/Allergies: Negative for polydipsia.  Psychiatric/Behavioral: Negative for depression. The patient is not nervous/anxious.     MEDICATIONS AT HOME:   Prior to Admission medications   Medication Sig Start Date End Date Taking? Authorizing Provider  allopurinol (ZYLOPRIM) 100 MG tablet Take 200 mg by mouth at bedtime.   Yes [provider]  amLODipine (NORVASC) 10 MG tablet Take 10 mg by mouth daily. 05/19/18  Yes [provider]  aspirin 325 MG tablet Take 325 mg by mouth daily.   Yes [provider]  brimonidine (ALPHAGAN) 0.2 % ophthalmic solution Apply to eye. 02/03/18  Yes [provider]  cloNIDine (CATAPRES) 0.1 MG tablet Take 0.1 mg by mouth 2 (two) times daily. TAKE 0.1MG  BY MOUTH ON Sunday,TUESDAY,THURSDAY, AND SATURDAY 11/08/16  Yes [provider]  clopidogrel (PLAVIX) 75 MG tablet Take 1 tablet (75 mg total) by mouth daily. 01/26/17  Yes Gouru, Illene Silver, MD  colchicine 0.6 MG tablet Take 0.5 tablets (0.3 mg total) by mouth 2 (two) times a week. 07/09/16  Yes Demetrios Loll, MD  dorzolamide-timolol (COSOPT) 22.3-6.8 MG/ML ophthalmic solution INSTILL 1 DROP IN LEFT EYE TWICE A DAY 06/20/18  Yes [provider]  famotidine (PEPCID) 20 MG tablet Take 20 mg by mouth at bedtime.  11/29/16  Yes [provider]  ferrous sulfate 325 (65 FE) MG tablet Take 1 tablet (325 mg total) by mouth daily with breakfast. 06/18/16  Yes Fritzi Mandes, MD  furosemide (LASIX) 40 MG tablet Take 1 tablet (40 mg total) by mouth daily. 07/05/16  Yes Demetrios Loll, MD  gabapentin (NEURONTIN) 300 MG capsule Take 300 mg by  mouth 3 (three) times daily. 05/19/18  Yes [provider]  glimepiride (AMARYL) 1 MG tablet Take 1 mg by mouth daily with breakfast.   Yes [provider]  glipiZIDE (GLUCOTROL) 5 MG tablet Take 5 mg by mouth daily. 05/19/18  Yes [provider]  hydrALAZINE (APRESOLINE) 50 MG tablet Take 50 mg by mouth 2 (two) times daily. ONLY ON Tuesday,THURSDAY,SATURDAY ,AND SUNDAY 11/29/16  Yes [provider]  latanoprost (XALATAN) 0.005 % ophthalmic solution Place 1 drop into both eyes at bedtime. 03/20/16  Yes [provider]  metoprolol succinate (TOPROL-XL) 25 MG 24 hr tablet Take 1 tablet (25 mg total) by mouth at bedtime. 12/13/16  Yes Wieting, Richard, MD  mirtazapine (REMERON) 15 MG tablet Take 15 mg by mouth at bedtime. 02/03/18  Yes [provider]  multivitamin (RENA-VIT) TABS tablet Take 1  tablet by mouth daily.   Yes [provider]  pravastatin (PRAVACHOL) 40 MG tablet Take 40 mg by mouth at bedtime.  06/21/15  Yes [provider]  sevelamer carbonate (RENVELA) 800 MG tablet Take 800 mg by mouth 3 (three) times daily with meals.   Yes [provider]  acetaminophen (TYLENOL) 650 MG CR tablet Take 650 mg by mouth every 8 (eight) hours as needed for pain.    [provider]  amitriptyline (ELAVIL) 25 MG tablet Take 25 mg by mouth at bedtime as needed. for sleep 05/19/18   [provider]  ASPERCREME W/LIDOCAINE 4 % CREA Apply topically as needed 04/18/18   [provider]  docusate sodium (COLACE) 100 MG capsule Take 1 capsule (100 mg total) by mouth daily as needed for mild constipation. 12/13/16   Loletha Grayer, MD  feeding supplement, ENSURE ENLIVE, (ENSURE ENLIVE) LIQD Take 237 mLs by mouth 2 (two) times daily between meals. 01/25/17   Max Sane, MD  fluticasone (FLONASE) 50 MCG/ACT nasal spray Place into the nose. 04/17/18   [provider]  [place] CREA Apply  topically. 06/19/18   [provider]  insulin aspart (NOVOLOG) 100 UNIT/ML injection Inject 0-5 Units into the skin at bedtime. CBG < 70: implement hypoglycemia protocol  CBG 70 - 120: 0 units  CBG 121 - 150: 2 units  CBG 151 - 200: 3 units  CBG 201 - 250: 5 units  CBG 251 - 300: 8 units  CBG 301 - 350: 11 units  CBG 351 - 400: 15 units Patient not taking: Reported on 11/14/2018 03/04/18   Bettey Costa, MD  ipratropium (ATROVENT) 0.03 % nasal spray Place 1 spray into both nostrils 3 (three) times daily.     [provider]  ipratropium-albuterol (DUONEB) 0.5-2.5 (3) MG/3ML SOLN Take 3 mLs by nebulization every 6 (six) hours as needed. Patient not taking: Reported on 11/05/2018 12/14/16   Gladstone Lighter, MD  oxyCODONE-acetaminophen (PERCOCET) 5-325 MG tablet Take 1 tablet by mouth every 4 (four) hours as needed for severe pain. 08/21/18   Schuyler Amor, MD  pantoprazole (PROTONIX) 40 MG tablet Take 1 tablet (40 mg total) by mouth daily. 06/18/16   Fritzi Mandes, MD  traMADol (ULTRAM) 50 MG tablet Take by mouth every 6 (six) hours as needed.    [provider]      VITAL SIGNS:  Blood pressure (!) 126/53, pulse (!) 27, temperature 97.8 F (36.6 C), temperature source Oral, resp. rate 18, height 5\' 7"  (1.702 m), weight 85 kg, SpO2 99 %.  PHYSICAL EXAMINATION:  Physical Exam  GENERAL:  78 y.o.-year-old patient lying in the bed with no acute distress.  EYES: Pupils equal, round, reactive to light and accommodation. No scleral icterus. Extraocular muscles intact.  HEENT: Head atraumatic, normocephalic. Oropharynx and nasopharynx clear.  NECK:  Supple, no jugular venous distention. No thyroid enlargement, no tenderness.  LUNGS: Normal breath sounds bilaterally, no wheezing, rales,rhonchi or crepitation. No use of accessory muscles of respiration.  CARDIOVASCULAR: S1, S2 bradycardia. No murmurs, rubs, or gallops.  ABDOMEN: Soft, nontender, nondistended. Bowel sounds  present. No organomegaly or mass.  EXTREMITIES: No pedal edema, cyanosis, or clubbing.  NEUROLOGIC: Cranial nerves II through XII are intact. Muscle strength 5/5 in all extremities. Sensation intact. Gait not checked.  PSYCHIATRIC: The patient is alert and oriented x 3.  SKIN: No obvious rash, lesion, or ulcer.   LABORATORY PANEL:   CBC Recent Labs  Lab 11/28/2018 1643  WBC 8.1  HGB 10.9*  HCT 34.5*  PLT 182   ------------------------------------------------------------------------------------------------------------------  Chemistries  Recent Labs  Lab 11/27/2018 1643  NA 140  K 3.0*  CL 95*  CO2 33*  GLUCOSE 148*  BUN 31*  CREATININE 4.46*  CALCIUM 9.2  MG 2.2   ------------------------------------------------------------------------------------------------------------------  Cardiac Enzymes Recent Labs  Lab 11/09/2018 1643  TROPONINI 0.04*   ------------------------------------------------------------------------------------------------------------------  RADIOLOGY:  Dg Chest Port 1 View  Result Date: 11/16/2018 CLINICAL DATA:  Hemodialysis completed with bradycardia. EXAM: PORTABLE CHEST 1 VIEW COMPARISON:  02/27/2018 FINDINGS: Lungs are hypoinflated without focal airspace consolidation or effusion. Cardiomediastinal silhouette and remainder of the exam is unchanged. IMPRESSION: Hypoinflation without acute cardiopulmonary disease. Electronically Signed   By: Marin Olp M.D.   On: 11/06/2018 17:09      IMPRESSION AND PLAN:   Symptomatic bradycardia due to second or third-degree AV block. The patient will be admitted to medical floor with telemetry monitor. Hold Lopressor and clonidine, continue telemetry monitor for any worsening or complete heart block requiring further intervention including pacemaker placement, echocardiograph per Dr. Nehemiah Massed.  Hypertension.  Hold clonidine and Lopressor, continue other home medication.  Hypokalemia.  Potassium  supplement.  ESRD on hemodialysis.  Continue hemodialysis as scheduled. Diabetes.  Hold p.o. medication, start sliding scale.  All the records are reviewed and case discussed with ED provider. Management plans discussed with the patient, her son and they are in agreement.  CODE STATUS: Full code.  TOTAL TIME TAKING CARE OF THIS PATIENT: 33 minutes.    Demetrios Loll M.D on 11/20/2018 at 7:27 PM  Between 7am to 6pm - Pager - [phone]  After 6pm go to [email or web address] - Proofreader  [facility]  Office  [phone]  CC: Primary care physician; [name], MD   Note: This dictation was prepared with Dragon dictation along with smaller phrase technology. Any transcriptional errors that result from this process are unin

## 2018-11-24 NOTE — ED Triage Notes (Signed)
Pt to ED via ems from Hemodialysis. Pt finished her treatment and it was noted that her HR was in the 30's. Pt denies sx's other than feeling weak. A/O x 4. Denies pain.

## 2018-11-24 NOTE — Progress Notes (Signed)
Advanced Care Plan.  Purpose of Encounter: CODE STATUS. Parties in Attendance: The patient, his son and me. Patient's Decisional Capacity: Yes. Medical Story: Laurie Steele  is a 78 y.o. female with a known history of multiple medical problems including CAD, chronic anemia, gout, hypertension, hyperlipidemia, MI, ESRD on hemodialysis, diabetes, GERD and etc. the patient is being admitted for symptomatic bradycardia due to second or third-degree AV block.  I discussed with the patient about her current condition, prognosis and CODE STATUS.  The patient wants to be resuscitated and intubated if she has cardiopulmonary arrest. Plan:  Code Status: Full code. Time spent discussing advance care planning: 17 minutes.

## 2018-11-24 NOTE — ED Notes (Signed)
Pt cleared by ED MD to eat and drink. Son states he is going to get pt food.

## 2018-11-24 NOTE — ED Provider Notes (Signed)
[facility] Emergency Department Provider Note   ____________________________________________   First MD Initiated Contact with Patient 11/27/2018 1641     (approximate)  I have reviewed the triage vital signs and the nursing notes.   HISTORY  Chief Complaint Bradycardia and Weakness    HPI Laurie Steele is a 78 y.o. female here for evaluation of low heart rate  Patient reports she has been feeling off and on a little bit "woozy" from time to time for a long time, not related to notable today but attributes it to unknown causes over time.  Today she went to dialysis, she felt like she was in her normal health, but they noticed her heart rate was way too low.  They called EMS for further evaluation.  EMS reports the believe the patient to be in third-degree heart block.  She is fully awake and alert not complain any chest pain or shortness of breath.  She reports she feels fine.  Finished her dialysis treatment today prior to being evaluated the ER.  Patient denies any history of problems with her heart rate.   Past Medical History:  Diagnosis Date  . Anxiety   . CAD (coronary artery disease)   . Chronic anemia    Dr Inez Pilgrim  . Dysrhythmia   . GERD (gastroesophageal reflux disease)   . Glaucoma   . Gout   . Gout   . Hx of colonic polyps   . Hyperlipidemia   . Hypertension   . Lower back pain   . Miscarriage   . Myocardial infarction ([place])   . 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 Neuropathy   . 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 Peritoneal dialysis status ([place])   . Renal failure    Dr Holley Raring  . Renal insufficiency   . 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 Vaginal delivery    x 4    Patient Active Problem List   Diagnosis Date Noted  . Decreased pedal pulses 07/03/2018  . Tremors of nervous system 04/08/2018  . Altered mental  status 02/28/2018  . Complication of vascular access for dialysis 07/18/2017  . UTI (urinary tract infection) 01/22/2017  . CHF (congestive heart failure) ([place]) 12/11/2016  . Ds DNA antibody positive 07/09/2016  . Pneumonia 07/03/2016  . Anemia 06/17/2016  . Symptomatic anemia 06/15/2016  . Hypoglycemia 05/21/2016  . Chest pain 04/05/2016  . Hypercalcemia 03/14/2016  . Bronchitis 03/03/2012  . Type II or unspecified type diabetes mellitus with renal manifestations, not stated as uncontrolled(250.40) 12/18/2011  . END STAGE RENAL DISEASE 10/23/2010  . DM (diabetes mellitus), type 2 ([place]) 12/13/2009  . NEUROPATHY 12/13/2009  . GOUT 02/15/2009  . SLEEP DISORDER 02/15/2009  . GERD 02/23/2008  . Hyperlipidemia 05/14/2007  . ANEMIA, CHRONIC 05/14/2007  . Essential hypertension 05/14/2007    Past Surgical History:  Procedure Laterality Date  . ABDOMINAL HYSTERECTOMY  1983  . AV FISTULA PLACEMENT Left 09/21/2016   Procedure: ARTERIOVENOUS (AV) FISTULA CREATION ( BRACHIOCEPHALIC );  Surgeon: Katha Cabal, MD;  Location: [facility] ORS;  Service: Vascular;  Laterality: Left;  . BACK SURGERY  6/08   Dr Collier Salina  . BACK SURGERY  1987   disc  . CHOLECYSTECTOMY    . DIALYSIS/PERMA CATHETER REMOVAL N/A 02/25/2017   Procedure: Dialysis/Perma Catheter Removal;  Surgeon: Algernon Huxley, MD;  Location: [place] [facility];  Service: Cardiovascular;  Laterality: N/A;  . INSERTION OF DIALYSIS CATHETER  2017  . PERITONEAL CATHETER INSERTION    . REMOVAL OF A DIALYSIS CATHETER N/A 09/21/2016   Procedure: REMOVAL OF A DIALYSIS CATHETER ( PERITONEAL DIALYSIS CATH );  Surgeon: Katha Cabal, MD;  Location: [facility] ORS;  Service: Vascular;  Laterality: N/A;  . VITRECTOMY AND CATARACT Left 11/04/2017   Procedure: VITRECTOMY AND CATARACT;  Surgeon: Leandrew Koyanagi, MD;  Location: [facility] ORS;  Service: Ophthalmology;  Laterality: Left;  US00:51.6 AP%21.8 JKD32.67 FLUID LOT #[id number]    Prior to  Admission medications   Medication Sig Start Date End Date Taking? Authorizing Provider  allopurinol (ZYLOPRIM) 100 MG tablet Take 200 mg by mouth at bedtime.   Yes [provider]  amLODipine (NORVASC) 10 MG tablet Take 10 mg by mouth daily. 05/19/18  Yes [provider]  aspirin 325 MG tablet Take 325 mg by mouth daily.   Yes [provider]  brimonidine (ALPHAGAN) 0.2 % ophthalmic solution Apply to eye. 02/03/18  Yes [provider]  cloNIDine (CATAPRES) 0.1 MG tablet Take 0.1 mg by mouth 2 (two) times daily. TAKE 0.1MG  BY MOUTH ON Sunday,TUESDAY,THURSDAY, AND SATURDAY 11/08/16  Yes [provider]  clopidogrel (PLAVIX) 75 MG tablet Take 1 tablet (75 mg total) by mouth daily. 01/26/17  Yes Gouru, Illene Silver, MD  colchicine 0.6 MG tablet Take 0.5 tablets (0.3 mg total) by mouth 2 (two) times a week. 07/09/16  Yes Demetrios Loll, MD  dorzolamide-timolol (COSOPT) 22.3-6.8 MG/ML ophthalmic solution INSTILL 1 DROP IN LEFT EYE TWICE A DAY 06/20/18  Yes [provider]  famotidine (PEPCID) 20 MG tablet Take 20 mg by mouth at bedtime.  11/29/16  Yes [provider]  ferrous sulfate 325 (65 FE) MG tablet Take 1 tablet (325 mg total) by mouth daily with breakfast. 06/18/16  Yes Fritzi Mandes, MD  furosemide (LASIX) 40 MG tablet Take 1 tablet (40 mg total) by mouth daily. 07/05/16  Yes Demetrios Loll, MD  gabapentin (NEURONTIN) 300 MG capsule Take 300 mg by mouth 3 (three) times daily. 05/19/18  Yes [provider]  glimepiride (AMARYL) 1 MG tablet Take 1 mg by mouth daily with breakfast.   Yes [provider]  glipiZIDE (GLUCOTROL) 5 MG tablet Take 5 mg by mouth daily. 05/19/18  Yes [provider]  hydrALAZINE (APRESOLINE) 50 MG tablet Take 50 mg by mouth 2 (two) times daily. ONLY ON Tuesday,THURSDAY,SATURDAY ,AND SUNDAY 11/29/16  Yes [provider]  latanoprost (XALATAN) 0.005 % ophthalmic solution Place 1 drop into both eyes at  bedtime. 03/20/16  Yes [provider]  metoprolol succinate (TOPROL-XL) 25 MG 24 hr tablet Take 1 tablet (25 mg total) by mouth at bedtime. 12/13/16  Yes Wieting, Richard, MD  mirtazapine (REMERON) 15 MG tablet Take 15 mg by mouth at bedtime. 02/03/18  Yes [provider]  multivitamin (RENA-VIT) TABS tablet Take 1 tablet by mouth daily.   Yes [provider]  pravastatin (PRAVACHOL) 40 MG tablet Take 40 mg by mouth at bedtime.  06/21/15  Yes [provider]  sevelamer carbonate (RENVELA) 800 MG tablet Take 800 mg by mouth 3 (three) times daily with meals.   Yes [provider]  acetaminophen (TYLENOL) 650 MG CR tablet Take 650 mg by mouth every 8 (eight) hours as needed for pain.    [provider]  amitriptyline (ELAVIL) 25 MG tablet Take 25 mg by mouth at  bedtime as needed. for sleep 05/19/18   [provider]  ASPERCREME W/LIDOCAINE 4 % CREA Apply topically as needed 04/18/18   [provider]  docusate sodium (COLACE) 100 MG capsule Take 1 capsule (100 mg total) by mouth daily as needed for mild constipation. 12/13/16   Loletha Grayer, MD  feeding supplement, ENSURE ENLIVE, (ENSURE ENLIVE) LIQD Take 237 mLs by mouth 2 (two) times daily between meals. 01/25/17   Max Sane, MD  fluticasone (FLONASE) 50 MCG/ACT nasal spray Place into the nose. 04/17/18   [provider]  [place] CREA Apply topically. 06/19/18   [provider]  insulin aspart (NOVOLOG) 100 UNIT/ML injection Inject 0-5 Units into the skin at bedtime. CBG < 70: implement hypoglycemia protocol  CBG 70 - 120: 0 units  CBG 121 - 150: 2 units  CBG 151 - 200: 3 units  CBG 201 - 250: 5 units  CBG 251 - 300: 8 units  CBG 301 - 350: 11 units  CBG 351 - 400: 15 units Patient not taking: Reported on 11/16/2018 03/04/18   Bettey Costa, MD  ipratropium (ATROVENT) 0.03 % nasal spray Place 1 spray into both nostrils 3 (three) times daily.      [provider]  ipratropium-albuterol (DUONEB) 0.5-2.5 (3) MG/3ML SOLN Take 3 mLs by nebulization every 6 (six) hours as needed. Patient not taking: Reported on 12/03/2018 12/14/16   Gladstone Lighter, MD  oxyCODONE-acetaminophen (PERCOCET) 5-325 MG tablet Take 1 tablet by mouth every 4 (four) hours as needed for severe pain. 08/21/18   Schuyler Amor, MD  pantoprazole (PROTONIX) 40 MG tablet Take 1 tablet (40 mg total) by mouth daily. 06/18/16   Fritzi Mandes, MD  traMADol (ULTRAM) 50 MG tablet Take by mouth every 6 (six) hours as needed.    [provider]    Allergies Alprazolam  Family History  Problem Relation Age of Onset  . Heart attack Father   . Hypertension Mother   . Hyperlipidemia Mother   . Coronary artery disease Other        strong fam hx  . Kidney failure Brother   . Breast cancer Neg Hx     Social History Social History   Tobacco Use  . Smoking status: Former Smoker    Last attempt to quit: 11/05/1990    Years since quitting: 28.0  . Smokeless tobacco: Never Used  Substance Use Topics  . Alcohol use: No  . Drug use: No    Review of Systems Constitutional: No fever/chills gets a little "woozy" off and on at times Eyes: No visual changes. ENT: No sore throat. Cardiovascular: Denies chest pain. Respiratory: Denies shortness of breath. Gastrointestinal: No abdominal pain.   Genitourinary: Negative for dysuria. Musculoskeletal: Negative for back pain. Skin: Negative for rash. Neurological: Negative for headaches, areas of focal weakness or numbness.    ____________________________________________   PHYSICAL EXAM:  VITAL SIGNS: ED Triage Vitals  Enc Vitals Group     BP 11/12/2018 1634 (!) 147/48     Pulse Rate 11/11/2018 1635 70     Resp 11/23/2018 1635 13     Temp 11/19/2018 1635 97.8 F (36.6 C)     Temp Source 11/11/2018 1635 Oral     SpO2 11/06/2018 1635 100 %     Weight 11/13/2018 1636 187 lb 6.3 oz (85 kg)     Height 11/15/2018 1636  5\' 7"  (1.702 m)     Head Circumference --  Peak Flow --      Pain Score 11/14/2018 1636 0     Pain Loc --      Pain Edu? --      Excl. in [place]? --     Constitutional: Alert and oriented. Well appearing and in no acute distress. Eyes: Conjunctivae are normal. Head: Atraumatic. Nose: No congestion/rhinnorhea. Mouth/Throat: Mucous membranes are moist. Neck: No stridor.  Cardiovascular: Rate of about 35-45, bradycardic l rate, irregular rhythm. Grossly normal heart sounds.  Good peripheral circulation. Respiratory: Normal respiratory effort.  No retractions. Lungs CTAB. Gastrointestinal: Soft and nontender. No distention. Musculoskeletal: No lower extremity tenderness nor edema. Neurologic:  Normal speech and language. No gross focal neurologic deficits are appreciated.  Skin:  Skin is warm, dry and intact. No rash noted. Psychiatric: Mood and affect are normal. Speech and behavior are normal.  ____________________________________________   LABS (all labs ordered are listed, but only abnormal results are displayed)  Labs Reviewed  CBC - Abnormal; Notable for the following components:      Result Value   RBC 3.10 (*)    Hemoglobin 10.9 (*)    HCT 34.5 (*)    MCV 111.3 (*)    MCH 35.2 (*)    RDW 15.6 (*)    All other components within normal limits  BASIC METABOLIC PANEL - Abnormal; Notable for the following components:   Potassium 3.0 (*)    Chloride 95 (*)    CO2 33 (*)    Glucose, Bld 148 (*)    BUN 31 (*)    Creatinine, Ser 4.46 (*)    GFR calc non Af Amer 9 (*)    GFR calc Af Amer 10 (*)    All other components within normal limits  TROPONIN I - Abnormal; Notable for the following components:   Troponin I 0.04 (*)    All other components within normal limits  MAGNESIUM   ____________________________________________  EKG  Reviewed and interpreted at 1635 Heart rate about 60 on EKG, however of note there are times when her heart rate does appear to drop down  into the 40s to 30s on telemetry QRS 130 QTc 510 Probably third-degree heart block with high junctional QRSs and frequent PVCs. ____________________________________________  RADIOLOGY  Dg Chest Port 1 View  Result Date: 11/06/2018 CLINICAL DATA:  Hemodialysis completed with bradycardia. EXAM: PORTABLE CHEST 1 VIEW COMPARISON:  02/27/2018 FINDINGS: Lungs are hypoinflated without focal airspace consolidation or effusion. Cardiomediastinal silhouette and remainder of the exam is unchanged. IMPRESSION: Hypoinflation without acute cardiopulmonary disease. Electronically Signed   By: Marin Olp M.D.   On: 11/08/2018 17:09     Chest x-ray reviewed negative for acute ____________________________________________   PROCEDURES  Procedure(s) performed: None  Procedures  Critical Care performed: Yes  CRITICAL CARE Performed by: Delman Kitten   Total critical care time: 35 minutes  Critical care time was exclusive of separately billable procedures and treating other patients.  Critical care was necessary to treat or prevent imminent or life-threatening deterioration.  Critical care was time spent personally by me on the following activities: development of treatment plan with patient and/or surrogate as well as nursing, discussions with consultants, evaluation of patient's response to treatment, examination of patient, obtaining history from patient or surrogate, ordering and performing treatments and interventions, ordering and review of laboratory studies, ordering and review of radiographic studies, pulse oximetry and re-evaluation of patient's condition.  Patient presents for severe bradycardia, concerning findings for third-degree heart block ____________________________________________  INITIAL IMPRESSION / ASSESSMENT AND PLAN / ED COURSE  Pertinent labs & imaging results that were available during my care of the patient were reviewed by me and considered in my medical decision  making (see chart for details).   Patient events for relatively asymptomatic bradycardia, concerning for possible type III heart block however, but hemodynamically stable.  She was seen by Dr. Nehemiah Massed in the emergency department who recommends admission, further observation with holding of rate related cardiac active medications at this time.  Patient agreement.  Continues to rest comfortably.  Telemetry monitoring.  Hypokalemia noted, but discussed with Dr. Nehemiah Massed recommends not treating especially in the setting of end-stage renal disease which I think is reasonable.  ----------------------------------------- 6:23 PM on 11/16/2018 -----------------------------------------  Case and care discussed with Dr. Bridgett Larsson.  Patient will be admitted for further evaluation under hospitalist and cardiology services.      ____________________________________________   FINAL CLINICAL IMPRESSION(S) / ED DIAGNOSES  Final diagnoses:  Bradycardia        Note:  This document was prepared using Dragon voice recognition software and may include unintentional dictation errors       Delman Kitten, MD 11/23/2018 1824

## 2018-11-25 ENCOUNTER — Encounter: Payer: Self-pay | Admitting: Emergency Medicine

## 2018-11-25 ENCOUNTER — Inpatient Hospital Stay
Admit: 2018-11-25 | Discharge: 2018-11-25 | Disposition: A | Discharge status: Home / Self Care | Payer: Medicare Other | Attending: Internal Medicine | Admitting: Internal Medicine

## 2018-11-25 LAB — GLUCOSE, CAPILLARY
Glucose-Capillary: 120 mg/dL — ABNORMAL HIGH (ref 70–99)
Glucose-Capillary: 115 mg/dL — ABNORMAL HIGH (ref 70–99)
Glucose-Capillary: 173 mg/dL — ABNORMAL HIGH (ref 70–99)
Glucose-Capillary: 157 mg/dL — ABNORMAL HIGH (ref 70–99)

## 2018-11-25 LAB — BASIC METABOLIC PANEL
Anion gap: 9 (ref 5–15)
BUN: 44 mg/dL — AB (ref 8–23)
CO2: 32 mmol/L (ref 22–32)
CREATININE: 5.72 mg/dL — AB (ref 0.44–1.00)
Calcium: 9.2 mg/dL (ref 8.9–10.3)
Chloride: 100 mmol/L (ref 98–111)
GFR calc Af Amer: 8 mL/min — ABNORMAL LOW (ref >60)
GFR calc non Af Amer: 7 mL/min — ABNORMAL LOW (ref >60)
GLUCOSE: 127 mg/dL — AB (ref 70–99)
Potassium: 4 mmol/L (ref 3.5–5.1)
Sodium: 141 mmol/L (ref 135–145)

## 2018-11-25 LAB — ECHOCARDIOGRAM COMPLETE
Height: 67 in
Weight: 2816 oz

## 2018-11-25 MED ORDER — GABAPENTIN 300 MG PO CAPS
300.0000 mg | ORAL_CAPSULE | Freq: Every day | ORAL | Status: DC
  Administered 2018-11-26 – 2018-12-03 (×8): 300 mg via ORAL
  Filled 2018-11-25 (×9): qty 1

## 2018-11-25 MED ORDER — SEVELAMER CARBONATE 800 MG PO TABS
2400.0000 mg | ORAL_TABLET | Freq: Three times a day (TID) | ORAL | Status: DC
  Administered 2018-11-25 – 2018-12-05 (×18): 2400 mg via ORAL
  Filled 2018-11-25 (×23): qty 3

## 2018-11-25 NOTE — Plan of Care (Signed)

## 2018-11-25 NOTE — Progress Notes (Signed)
*  PRELIMINARY RESULTS* Echocardiogram 2D Echocardiogram has been performed.  Laurie Steele 11/25/2018, 9:02 AM

## 2018-11-25 NOTE — Progress Notes (Signed)
[facility]  ROUNDING NOTE   Subjective:   [name] was admitted to [facility] on 11/15/2018 for Bradycardia [R00.1]  Hemodialysis treatment was yesterday. Tolerated treatment well.   Patient's metoprolol and clonidine held this morning.   Objective:  Vital signs in last 24 hours:  Temp:  [97.8 F (36.6 C)-98.5 F (36.9 C)] 98.5 F (36.9 C) (01/21 0813) Pulse Rate:  [27-82] 32 (01/21 0813) Resp:  [13-20] 18 (01/21 0813) BP: (126-161)/(27-53) 127/27 (01/21 0813) SpO2:  [94 %-100 %] 96 % (01/21 0813) Weight:  [79.8 kg-85 kg] 79.8 kg (01/20 2033)  Weight change:  Filed Weights   11/25/2018 1636 11/14/2018 2033  Weight: 85 kg 79.8 kg    Intake/Output: I/O last 3 completed shifts: In: 3 [I.V.:3] Out: 0    Intake/Output this shift:  No intake/output data recorded.  Physical Exam: General: NAD,   Head: Normocephalic, atraumatic. Moist oral mucosal membranes  Eyes: Anicteric, PERRL  Neck: Supple, trachea midline  Lungs:  Clear to auscultation  Heart: bradycardia  Abdomen:  Soft, nontender,   Extremities:  no peripheral edema.  Neurologic: Nonfocal, moving all four extremities  Skin: No lesions  Access: Left AVF    Basic Metabolic Panel: Recent Labs  Lab 11/29/2018 1643 11/25/18 0518  NA 140 141  K 3.0* 4.0  CL 95* 100  CO2 33* 32  GLUCOSE 148* 127*  BUN 31* 44*  CREATININE 4.46* 5.72*  CALCIUM 9.2 9.2  MG 2.2  --     Liver Function Tests: No results for input(s): AST, ALT, ALKPHOS, BILITOT, PROT, ALBUMIN in the last 168 hours. No results for input(s): LIPASE, AMYLASE in the last 168 hours. No results for input(s): AMMONIA in the last 168 hours.  CBC: Recent Labs  Lab 11/09/2018 1643  WBC 8.1  HGB 10.9*  HCT 34.5*  MCV 111.3*  PLT 182    Cardiac Enzymes: Recent Labs  Lab 11/18/2018 1643  TROPONINI 0.04*    BNP: Invalid input(s): POCBNP  CBG: Recent Labs  Lab 11/30/2018 2102 11/25/18 0810  GLUCAP 175* 120*     Microbiology: Results for orders placed or performed during the hospital encounter of 04/08/18  MRSA PCR Screening     Status: None   Collection Time: 04/08/18 11:14 AM  Result Value Ref Range Status   MRSA by PCR NEGATIVE NEGATIVE Final    Comment:        The GeneXpert MRSA Assay (FDA approved for NASAL specimens only), is one component of a comprehensive MRSA colonization surveillance program. It is not intended to diagnose MRSA infection nor to guide or monitor treatment for MRSA infections. Performed at [facility], [address] [place], [place] [address]     Coagulation Studies: No results for input(s): LABPROT, INR in the last 72 hours.  Urinalysis: No results for input(s): COLORURINE, LABSPEC, PHURINE, GLUCOSEU, HGBUR, BILIRUBINUR, KETONESUR, PROTEINUR, UROBILINOGEN, NITRITE, LEUKOCYTESUR in the last 72 hours.  Invalid input(s): APPERANCEUR    Imaging: Dg Chest Port 1 View  Result Date: 11/09/2018 CLINICAL DATA:  Hemodialysis completed with bradycardia. EXAM: PORTABLE CHEST 1 VIEW COMPARISON:  02/27/2018 FINDINGS: Lungs are hypoinflated without focal airspace consolidation or effusion. Cardiomediastinal silhouette and remainder of the exam is unchanged. IMPRESSION: Hypoinflation without acute cardiopulmonary disease. Electronically Signed   By: Marin Olp M.D.   On: 11/11/2018 17:09     Medications:   . sodium chloride     . allopurinol  200 mg Oral QHS  . aspirin EC  325 mg Oral Daily  . clopidogrel  75 mg Oral Daily  . famotidine  20 mg Oral QHS  . feeding supplement (ENSURE ENLIVE)  237 mL Oral BID BM  . ferrous sulfate  325 mg Oral Q breakfast  . furosemide  40 mg Oral Daily  . gabapentin  300 mg Oral TID  . heparin  5,000 Units Subcutaneous Q8H  . hydrALAZINE  50 mg Oral 2 times per day on Sun Tue Thu Sat  . insulin aspart  0-5 Units Subcutaneous QHS  . insulin aspart  0-9 Units Subcutaneous TID WC  . latanoprost  1 drop Both Eyes  QHS  . mirtazapine  15 mg Oral QHS  . multivitamin  1 tablet Oral Daily  . pantoprazole  40 mg Oral Daily  . pravastatin  40 mg Oral QHS  . sevelamer carbonate  800 mg Oral TID WC  . sodium chloride flush  3 mL Intravenous Q12H   sodium chloride, acetaminophen **OR** acetaminophen, albuterol, amitriptyline, bisacodyl, docusate sodium, HYDROcodone-acetaminophen, ipratropium, ondansetron **OR** ondansetron (ZOFRAN) IV, senna-docusate, sodium chloride flush  Assessment/ Plan:  [name] is a 78 y.o. black female with end stage renal disease on hemodialysis, coronary artery disease, hypertension, diabetes mellitus type II, GERD, glaucoma, gout, who presents with bradycardia  [facility] MWF [place]. Left AVF 81.5  1. End Stage Renal Disease: MWF Dialysis yesterday. No indication for dialysis today.   2. Bradycardia: holding clonidine and metoprolol - appreciate cardiology input.  Currently blood pressure at goal. Hydralazine and furosemide ordered.   3. Secondary Hyperparathyroidism: outpatient labs: 11/17/18: PTH 743, phos 4.7, calcium 8.7 - sevelamer 3 tablets with meals and 1 with snacks.   4 Anemia of chronic kidney disease: hemoglobin 10.9 - EPO as outpatient   LOS: 1 [place] 1/21/202010:42 AM

## 2018-11-25 NOTE — Progress Notes (Addendum)
Patients HR is running in 30-40s.  She is alert, BP is stable and she has no complaints at this time.  Dr. Nehemiah Massed made aware via text page. Will continue to monitor.    Per Dr. Nehemiah Massed, patient may need pacemaker but that will be decided in the AM.

## 2018-11-25 NOTE — Progress Notes (Signed)
[facility] Encounter Note  Patient: Laurie Steele / Admit Date: 11/15/2018 / Date of Encounter: 11/25/2018, 8:29 AM   Subjective: No significant change in patient condition.  Patient still see feels somewhat weak and fatigued.  There is been no evidence of chest pain and or congestive heart failure type symptoms.  The patient does continue to have issues with tachycardia.  Telemetry shows normal sinus rhythm with second-degree block with 2-1 block at a heart rate of less than 40 bpm.  There is no evidence of current advanced heart block by telemetry including no evidence of third-degree block.  We are still potentially awaiting for metabolism of current medical regimen including clonidine and beta-blocker  Review of Systems: Positive for: Weakness fatigue Negative for: Vision change, hearing change, syncope, dizziness, nausea, vomiting,diarrhea, bloody stool, stomach pain, cough, congestion, diaphoresis, urinary frequency, urinary pain,skin lesions, skin rashes Others previously listed  Objective: Telemetry: Normal sinus rhythm with second-degree block with 2-1 block Physical Exam: Blood pressure (!) 127/27, pulse (!) 32, temperature 98.5 F (36.9 C), temperature source Oral, resp. rate 18, height 5\' 7"  (1.702 m), weight 79.8 kg, SpO2 96 %. Body mass index is 27.57 kg/m. General: Well developed, well nourished, in no acute distress. Head: Normocephalic, atraumatic, sclera non-icteric, no xanthomas, nares are without discharge. Neck: No apparent masses Lungs: Normal respirations with no wheezes, no rhonchi, no rales , no crackles   Heart: Regular rate and rhythm, normal S1 S2, no murmur, no rub, no gallop, PMI is normal size and placement, carotid upstroke normal without bruit, jugular venous pressure normal Abdomen: Soft, non-tender, non-distended with normoactive bowel sounds. No hepatosplenomegaly. Abdominal aorta is normal size without bruit Extremities: No edema,  no clubbing, no cyanosis, no ulcers,  Peripheral: 2+ radial, 2+ femoral, 2+ dorsal pedal pulses with left arm fistula Neuro: Alert and oriented. Moves all extremities spontaneously. Psych:  Responds to questions appropriately with a normal affect.   Intake/Output Summary (Last 24 hours) at 11/25/2018 0829 Last data filed at 11/25/2018 0447 Gross per 24 hour  Intake 3 ml  Output 0 ml  Net 3 ml    Inpatient Medications:  . allopurinol  200 mg Oral QHS  . aspirin EC  325 mg Oral Daily  . clopidogrel  75 mg Oral Daily  . famotidine  20 mg Oral QHS  . feeding supplement (ENSURE ENLIVE)  237 mL Oral BID BM  . ferrous sulfate  325 mg Oral Q breakfast  . furosemide  40 mg Oral Daily  . gabapentin  300 mg Oral TID  . heparin  5,000 Units Subcutaneous Q8H  . hydrALAZINE  50 mg Oral 2 times per day on Sun Tue Thu Sat  . insulin aspart  0-5 Units Subcutaneous QHS  . insulin aspart  0-9 Units Subcutaneous TID WC  . latanoprost  1 drop Both Eyes QHS  . mirtazapine  15 mg Oral QHS  . multivitamin  1 tablet Oral Daily  . pantoprazole  40 mg Oral Daily  . pravastatin  40 mg Oral QHS  . sevelamer carbonate  800 mg Oral TID WC  . sodium chloride flush  3 mL Intravenous Q12H   Infusions:  . sodium chloride      Labs: Recent Labs    11/11/2018 1643 11/25/18 0518  NA 140 141  K 3.0* 4.0  CL 95* 100  CO2 33* 32  GLUCOSE 148* 127*  BUN 31* 44*  CREATININE 4.46* 5.72*  CALCIUM 9.2 9.2  MG  2.2  --    No results for input(s): AST, ALT, ALKPHOS, BILITOT, PROT, ALBUMIN in the last 72 hours. Recent Labs    12/04/2018 1643  WBC 8.1  HGB 10.9*  HCT 34.5*  MCV 111.3*  PLT 182   Recent Labs    11/27/2018 1643  TROPONINI 0.04*   Invalid input(s): POCBNP No results for input(s): HGBA1C in the last 72 hours.   Weights: Filed Weights   11/11/2018 1636 11/29/2018 2033  Weight: 85 kg 79.8 kg     Radiology/Studies:  Dg Chest Port 1 View  Result Date: 12/02/2018 CLINICAL DATA:   Hemodialysis completed with bradycardia. EXAM: PORTABLE CHEST 1 VIEW COMPARISON:  02/27/2018 FINDINGS: Lungs are hypoinflated without focal airspace consolidation or effusion. Cardiomediastinal silhouette and remainder of the exam is unchanged. IMPRESSION: Hypoinflation without acute cardiopulmonary disease. Electronically Signed   By: Marin Olp M.D.   On: 11/10/2018 17:09     Assessment and Recommendation  78 y.o. female with known essential hypertension mixed hyperlipidemia chronic kidney disease stage V having significant bradycardia and heart block currently at 40 bpm and potentially needing further intervention. 1.  Continue to monitor throughout today following for worsening heart block or inability for improvements of heart block after discontinuation of medication management.  We have discussed at length the possibility of need for pacemaker placement if necessary.  The patient understands risk and benefits of pacemaker placement including the possibility of death stroke heart attack infection bleeding blood clot hemopericardium pneumothorax.  The patient is at low risk for conscious sedation 2.  Continue other antihypertensive medication management at this time 3.  Echocardiogram for LV systolic dysfunction valvular heart disease  Signed, Serafina Royals M.D. FACC

## 2018-11-25 NOTE — Progress Notes (Signed)
[place] [facility] [place] NAME: Laurie Steele    MR#:  [id number]  DATE OF BIRTH:  [birth year]  SUBJECTIVE: Patient is seen at bedside, admitted for symptomatic, bradycardia held beta-blockers, clonidine.  Still feels very weak and tired.  CHIEF COMPLAINT:   Chief Complaint  Patient presents with  . Bradycardia  . Weakness    REVIEW OF SYSTEMS:   Review of Systems  Unable to perform ROS: Medical condition   DRUG ALLERGIES:   Allergies  Allergen Reactions  . Alprazolam Other (See Comments)    Unable to sleep and confusion    VITALS:  Blood pressure (!) 127/27, pulse (!) 32, temperature 98.5 F (36.9 C), temperature source Oral, resp. rate 18, height 5\' 7"  (1.702 m), weight 79.8 kg, SpO2 96 %.  PHYSICAL EXAMINATION:  GENERAL:  78 y.o.-year-old patient lying in the bed with no acute distress.  EYES: Pupils equal, round, reactive to light  No scleral icterus. Extraocular muscles intact.  HEENT: Head atraumatic, normocephalic. Oropharynx and nasopharynx clear.  NECK:  Supple, no jugular venous distention. No thyroid enlargement, no tenderness.  LUNGS: Normal breath sounds bilaterally, no wheezing, rales,rhonchi or crepitation. No use of accessory muscles of respiration.  CARDIOVASCULAR: S1, S2 bradycardic.  No murmurs, rubs, or gallops.  ABDOMEN: Soft, nontender, nondistended. Bowel sounds present. No organomegaly or mass.  EXTREMITIES: No pedal edema, cyanosis, or clubbing.  NEUROLOGIC: No gross neurological deficit is observed.,. PSYCHIATRIC: The patient is slightly lethargic. SKIN: No obvious rash, lesion, or ulcer.    LABORATORY PANEL:   CBC Recent Labs  Lab 11/11/2018 1643  WBC 8.1  HGB 10.9*  HCT 34.5*  PLT 182   ------------------------------------------------------------------------------------------------------------------  Chemistries  Recent Labs  Lab 11/30/2018 1643 11/25/18 0518  NA 140 141  K 3.0* 4.0   CL 95* 100  CO2 33* 32  GLUCOSE 148* 127*  BUN 31* 44*  CREATININE 4.46* 5.72*  CALCIUM 9.2 9.2  MG 2.2  --    ------------------------------------------------------------------------------------------------------------------  Cardiac Enzymes Recent Labs  Lab 11/23/2018 1643  TROPONINI 0.04*   ------------------------------------------------------------------------------------------------------------------  RADIOLOGY:  Dg Chest Port 1 View  Result Date: 11/15/2018 CLINICAL DATA:  Hemodialysis completed with bradycardia. EXAM: PORTABLE CHEST 1 VIEW COMPARISON:  02/27/2018 FINDINGS: Lungs are hypoinflated without focal airspace consolidation or effusion. Cardiomediastinal silhouette and remainder of the exam is unchanged. IMPRESSION: Hypoinflation without acute cardiopulmonary disease. Electronically Signed   By: Laurie Steele M.D.   On: 12/05/2018 17:09    EKG:   Orders placed or performed during the hospital encounter of 11/27/2018  . EKG 12-Lead  . EKG 12-Lead  . EKG 12-Lead  . EKG 12-Lead  . EKG 12-Lead  . 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  #1 third-degree AV block significant bradycardia at dialysis yesterday, monitored on telemetry, off the beta-blockers, clonidine.  Seen by cardiology, patient is still weak, fatigued, patient still has bradycardia with close to 1 block, monitor on telemetry, if patient bradycardia does not resolve patient may need pacemaker.  Follow echocardiogram.   2.  ESRD on hemodialysis Monday, Wednesday, Friday.   All the records are reviewed and case discussed with Care Management/Social Workerr. Management plans discussed with the patient, family and they are in agreement.  CODE STATUS: Full code  TOTAL TIME TAKING CARE OF THIS PATIENT: 38 minutes.   POSSIBLE D/C  IN 1-2 DAYS, DEPENDING ON  CLINICAL CONDITION.  More than 50% time spent in counseling, coordination of care  Laurie Steele M.D on 11/25/2018 at 12:27 PM  Between 7am to 6pm - Pager - [phone]  After 6pm go to [email or web address] - password [email or web address] [facility]  Office  [phone]  CC: Primary care physician; [name], MD   Note: This dictation was prepared with Dragon dictation along with smaller phrase technology. Any transcriptional errors that result from this process are unintentional.

## 2018-11-26 ENCOUNTER — Inpatient Hospital Stay: Payer: Medicare Other | Admitting: Certified Registered"

## 2018-11-26 ENCOUNTER — Encounter: Payer: Self-pay | Admitting: Anesthesiology

## 2018-11-26 ENCOUNTER — Encounter: Admission: EM | Disposition: E | Payer: Self-pay | Source: Ambulatory Visit | Attending: Internal Medicine

## 2018-11-26 LAB — GLUCOSE, CAPILLARY
Glucose-Capillary: 121 mg/dL — ABNORMAL HIGH (ref 70–99)
Glucose-Capillary: 143 mg/dL — ABNORMAL HIGH (ref 70–99)
Glucose-Capillary: 134 mg/dL — ABNORMAL HIGH (ref 70–99)

## 2018-11-26 LAB — RENAL FUNCTION PANEL
Albumin: 3.2 g/dL — ABNORMAL LOW (ref 3.5–5.0)
Anion gap: 13 (ref 5–15)
BUN: 64 mg/dL — ABNORMAL HIGH (ref 8–23)
CO2: 29 mmol/L (ref 22–32)
Calcium: 9 mg/dL (ref 8.9–10.3)
Chloride: 97 mmol/L — ABNORMAL LOW (ref 98–111)
Creatinine, Ser: 7.21 mg/dL — ABNORMAL HIGH (ref 0.44–1.00)
GFR calc Af Amer: 6 mL/min — ABNORMAL LOW (ref >60)
GFR calc non Af Amer: 5 mL/min — ABNORMAL LOW (ref >60)
Glucose, Bld: 147 mg/dL — ABNORMAL HIGH (ref 70–99)
POTASSIUM: 4.3 mmol/L (ref 3.5–5.1)
Phosphorus: 5.5 mg/dL — ABNORMAL HIGH (ref 2.5–4.6)
Sodium: 139 mmol/L (ref 135–145)

## 2018-11-26 LAB — CBC
HCT: 28.4 % — ABNORMAL LOW (ref 36.0–46.0)
Hemoglobin: 8.8 g/dL — ABNORMAL LOW (ref 12.0–15.0)
MCH: 34.8 pg — ABNORMAL HIGH (ref 26.0–34.0)
MCHC: 31 g/dL (ref 30.0–36.0)
MCV: 112.3 fL — AB (ref 80.0–100.0)
Platelets: 154 10*3/uL (ref 150–400)
RBC: 2.53 MIL/uL — ABNORMAL LOW (ref 3.87–5.11)
RDW: 15.2 % (ref 11.5–15.5)
WBC: 6.5 10*3/uL (ref 4.0–10.5)
nRBC: 0 % (ref 0.0–0.2)

## 2018-11-26 LAB — MRSA PCR SCREENING: MRSA by PCR: NEGATIVE

## 2018-11-26 SURGERY — INSERTION, CARDIAC PACEMAKER
Anesthesia: Moderate Sedation | Laterality: Right

## 2018-11-26 SURGERY — INSERTION, CARDIAC PACEMAKER
Anesthesia: Monitor Anesthesia Care | Laterality: Left

## 2018-11-26 MED ORDER — EPOETIN ALFA 10000 UNIT/ML IJ SOLN
10000.0000 [IU] | INTRAMUSCULAR | Status: DC
  Administered 2018-11-26 – 2018-12-05 (×3): 10000 [IU] via INTRAVENOUS

## 2018-11-26 SURGICAL SUPPLY — 32 items
BAG DECANTER FOR FLEXI CONT (MISCELLANEOUS) ×3 IMPLANT
BRUSH SCRUB EZ  4% CHG (MISCELLANEOUS) ×2
BRUSH SCRUB EZ 4% CHG (MISCELLANEOUS) ×1 IMPLANT
CABLE SURG 12 DISP A/V CHANNEL (MISCELLANEOUS) ×3 IMPLANT
CANISTER SUCT 1200ML W/VALVE (MISCELLANEOUS) ×3 IMPLANT
CHLORAPREP W/TINT 26ML (MISCELLANEOUS) ×3 IMPLANT
COVER LIGHT HANDLE STERIS (MISCELLANEOUS) ×6 IMPLANT
COVER MAYO STAND STRL (DRAPES) ×3 IMPLANT
COVER WAND RF STERILE (DRAPES) ×3 IMPLANT
DRAPE C-ARM XRAY 36X54 (DRAPES) ×3 IMPLANT
DRSG TEGADERM 4X4.75 (GAUZE/BANDAGES/DRESSINGS) ×3 IMPLANT
DRSG TELFA 4X3 1S NADH ST (GAUZE/BANDAGES/DRESSINGS) ×3 IMPLANT
ELECT REM PT RETURN 9FT ADLT (ELECTROSURGICAL) ×3
ELECTRODE REM PT RTRN 9FT ADLT (ELECTROSURGICAL) ×1 IMPLANT
GLOVE BIO SURGEON STRL SZ7.5 (GLOVE) ×3 IMPLANT
GLOVE BIO SURGEON STRL SZ8 (GLOVE) ×3 IMPLANT
GOWN STRL REUS W/ TWL LRG LVL3 (GOWN DISPOSABLE) ×1 IMPLANT
GOWN STRL REUS W/ TWL XL LVL3 (GOWN DISPOSABLE) ×1 IMPLANT
GOWN STRL REUS W/TWL LRG LVL3 (GOWN DISPOSABLE) ×2
GOWN STRL REUS W/TWL XL LVL3 (GOWN DISPOSABLE) ×2
IMMOBILIZER SHDR MD LX WHT (SOFTGOODS) IMPLANT
IMMOBILIZER SHDR XL LX WHT (SOFTGOODS) IMPLANT
INTRO PACEMKR SHEATH II 7FR (MISCELLANEOUS) ×3
INTRODUCER PACEMKR SHTH II 7FR (MISCELLANEOUS) ×1 IMPLANT
IV NS 500ML (IV SOLUTION) ×3
IV NS 500ML BAXH (IV SOLUTION) ×1 IMPLANT
KIT TURNOVER KIT A (KITS) ×3 IMPLANT
LABEL OR SOLS (LABEL) ×3 IMPLANT
MARKER SKIN DUAL TIP RULER LAB (MISCELLANEOUS) ×3 IMPLANT
PACK PACE INSERTION (MISCELLANEOUS) ×3 IMPLANT
PAD ONESTEP ZOLL R SERIES ADT (MISCELLANEOUS) ×3 IMPLANT
SUT SILK 0 SH 30 (SUTURE) ×9 IMPLANT

## 2018-11-26 NOTE — Progress Notes (Signed)
HD Tx completed, tolerated well, pt stable. UF goal met.    11/16/2018 1400  Vital Signs  Pulse Rate (!) 28  Pulse Rate Source Monitor  Resp 19  BP (!) 170/42  BP Location Right Arm  BP Method Automatic  Patient Position (if appropriate) Lying  Oxygen Therapy  SpO2 97 %  O2 Device Room Air  During Hemodialysis Assessment  HD Safety Checks Performed Yes  KECN 68.4 KECN  Dialysis Fluid Bolus Normal Saline  Bolus Amount (mL) 250 mL  Intra-Hemodialysis Comments Tx completed;Tolerated well

## 2018-11-26 NOTE — Progress Notes (Signed)
Pre HD Assessment    11/20/2018 1020  Neurological  Level of Consciousness Alert  Orientation Level Oriented X4  Respiratory  Respiratory Pattern Regular  Chest Assessment Chest expansion symmetrical  Bilateral Breath Sounds Clear;Diminished  Cough Non-productive  Cardiac  Pulse Irregular  Heart Sounds S1, S2  ECG Monitor Yes  Cardiac Rhythm SB;Heart block  Heart Block Type 3rd degree AVB  Vascular  R Radial Pulse +2  L Radial Pulse +2  Edema Generalized  Psychosocial  Psychosocial (WDL) WDL

## 2018-11-26 NOTE — Anesthesia Preprocedure Evaluation (Deleted)
Anesthesia Evaluation  Patient identified by MRN, date of birth, ID band Patient awake    Reviewed: Allergy & Precautions, NPO status , Patient's Chart, lab work & pertinent test results, reviewed documented beta blocker date and time   Airway Mallampati: III  TM Distance: >3 FB     Dental  (+) Chipped   Pulmonary pneumonia, resolved, former smoker,           Cardiovascular hypertension, Pt. on medications and Pt. on home beta blockers + CAD, + Past MI and +CHF  + dysrhythmias      Neuro/Psych Anxiety  Neuromuscular disease CVA, Residual Symptoms    GI/Hepatic GERD  Controlled,  Endo/Other  diabetes, Type 2  Renal/GU ESRFRenal disease     Musculoskeletal   Abdominal   Peds negative pediatric ROS (+)  Hematology  (+) anemia ,   Anesthesia Other Findings Gout.L weakness. Uses walker. K 4.0.  Reproductive/Obstetrics                             Anesthesia Physical  Anesthesia Plan  ASA: III  Anesthesia Plan: MAC   Post-op Pain Management:    Induction: Intravenous  PONV Risk Score and Plan:   Airway Management Planned: Nasal Cannula  Additional Equipment:   Intra-op Plan:   Post-operative Plan:   Informed Consent: I have reviewed the patients History and Physical, chart, labs and discussed the procedure including the risks, benefits and alternatives for the proposed anesthesia with the patient or authorized representative who has indicated his/her understanding and acceptance.       Plan Discussed with: CRNA and Surgeon  Anesthesia Plan Comments:         Anesthesia Quick Evaluation

## 2018-11-26 NOTE — Plan of Care (Signed)
  Problem: Clinical Measurements: Goal: Cardiovascular complication will be avoided Outcome: Progressing   Problem: Safety: Goal: Ability to remain free from injury will improve Outcome: Progressing   Problem: Skin Integrity: Goal: Risk for impaired skin integrity will decrease Outcome: Progressing   

## 2018-11-26 NOTE — Progress Notes (Signed)
HD Tx started w/o issue, pt is alert and oriented aware of SB HR and aware of intermittent memory loss, forgetfull ness, pt is stable ok to begin [place].   11/09/2018 1021  Vital Signs  Temp 98.1 F (36.7 C)  Temp Source Oral  Pulse Rate (!) 44  Pulse Rate Source Monitor  Resp 20  BP (!) 182/58  BP Location Right Arm  BP Method Automatic  Patient Position (if appropriate) Lying  Oxygen Therapy  SpO2 99 %  O2 Device Room Air  Pain Assessment  Pain Scale 0-10  Pain Score 0  Dialysis Weight  Weight 85 kg  Type of Weight Pre-Dialysis  Time-Out for Hemodialysis  What Procedure? HD  Pt Identifiers(min of two) First/Last Name;MRN/Account#  Correct Site? Yes  Correct Side? Yes  Correct Procedure? Yes  Consents Verified? Yes  Rad Studies Available? N/A  Safety Precautions Reviewed? Yes  Engineer, civil (consulting) Number 2  Station Number 1  UF/Alarm Test Passed  Conductivity: Meter 14  Conductivity: Machine  14  pH 7.4  Reverse Osmosis Main  Normal Saline Lot Number [id number]  Dialyzer Lot Number [id number]  Disposable Set Lot Number [id number]  Machine Temperature 98.6 F (37 C)  Musician and Audible Yes  Blood Lines Intact and Secured Yes  Pre Treatment Patient Checks  Vascular access used during treatment Fistula  Hepatitis B Surface Antigen Results Negative  Hepatitis B Surface Antibody  (>10)  Hemodialysis Consent Verified Yes  Hemodialysis Standing Orders Initiated Yes  ECG (Telemetry) Monitor On Yes  Prime Ordered Normal Saline  Length of  DialysisTreatment -hour(s) 3.5 Hour(s)  Dialysis Treatment Comments Na 140  Dialyzer Elisio 17H NR  Dialysis Anticoagulant None  Dialysate Flow Ordered 600  Blood Flow Rate Ordered 400 mL/min  Ultrafiltration Goal 1 Liters  Dialysis Blood Pressure Support Ordered Normal Saline  During Hemodialysis Assessment  Blood Flow Rate (mL/min) 400 mL/min  Arterial Pressure (mmHg) -150 mmHg  Venous Pressure (mmHg) 220 mmHg   Transmembrane Pressure (mmHg) 60 mmHg  Ultrafiltration Rate (mL/min) 400 mL/min  Dialysate Flow Rate (mL/min) 600 ml/min  Conductivity: Machine  14.2  HD Safety Checks Performed Yes  Dialysis Fluid Bolus Normal Saline  Bolus Amount (mL) 250 mL  Intra-Hemodialysis Comments Tx initiated  Education / Care Plan  Dialysis Education Provided Yes  Documented Education in Care Plan Yes  Fistula / Graft Left Upper arm Arteriovenous fistula  No Placement Date or Time found.   Placed prior to admission: Yes  Orientation: Left  Access Location: Upper arm  Access Type: Arteriovenous fistula  Site Condition No complications  Fistula / Graft Assessment Present;Thrill;Bruit  Status Accessed;Flushed;Patent  Needle Size 15 g  Drainage Description None

## 2018-11-26 NOTE — Progress Notes (Signed)
[facility]  ROUNDING NOTE   Subjective:   Seen and examined on hemodialysis treatment.   Bradycardia during treatment. UF limited.   Some confusion    HEMODIALYSIS FLOWSHEET:  Blood Flow Rate (mL/min): 400 mL/min Arterial Pressure (mmHg): -150 mmHg Venous Pressure (mmHg): 220 mmHg Transmembrane Pressure (mmHg): 60 mmHg Ultrafiltration Rate (mL/min): 400 mL/min Dialysate Flow Rate (mL/min): 600 ml/min Conductivity: Machine : 14.2 Conductivity: Machine : 14.2 Dialysis Fluid Bolus: Normal Saline Bolus Amount (mL): 250 mL    Objective:  Vital signs in last 24 hours:  Temp:  [97.7 F (36.5 C)-98.7 F (37.1 C)] 98.1 F (36.7 C) (01/22 1021) Pulse Rate:  [31-114] 44 (01/22 1245) Resp:  [20-25] 24 (01/22 1245) BP: (128-182)/(40-74) 159/49 (01/22 1245) SpO2:  [94 %-100 %] 94 % (01/22 1245) Weight:  [80.7 kg-85 kg] 85 kg (01/22 1021)  Weight change: -4.26 kg Filed Weights   11/21/2018 2033 12/03/2018 0508 11/25/2018 1021  Weight: 79.8 kg 80.7 kg 85 kg    Intake/Output: I/O last 3 completed shifts: In: 3 [I.V.:3] Out: 0    Intake/Output this shift:  Total I/O In: 120 [P.O.:120] Out: 200 [Urine:200]  Physical Exam: General: NAD,   Head: Normocephalic, atraumatic. Moist oral mucosal membranes  Eyes: Anicteric, PERRL  Neck: Supple, trachea midline  Lungs:  Clear to auscultation  Heart: bradycardia  Abdomen:  Soft, nontender,   Extremities: no peripheral edema.  Neurologic: Nonfocal, moving all four extremities  Skin: No lesions  Access: Left AVF    Basic Metabolic Panel: Recent Labs  Lab 11/23/2018 1643 11/25/18 0518 11/25/18 2353  NA 140 141 139  K 3.0* 4.0 4.3  CL 95* 100 97*  CO2 33* 32 29  GLUCOSE 148* 127* 147*  BUN 31* 44* 64*  CREATININE 4.46* 5.72* 7.21*  CALCIUM 9.2 9.2 9.0  MG 2.2  --   --   PHOS  --   --  5.5*    Liver Function Tests: Recent Labs  Lab 11/25/18 2353  ALBUMIN 3.2*   No results for input(s): LIPASE, AMYLASE  in the last 168 hours. No results for input(s): AMMONIA in the last 168 hours.  CBC: Recent Labs  Lab 11/14/2018 1643 11/25/18 2353  WBC 8.1 6.5  HGB 10.9* 8.8*  HCT 34.5* 28.4*  MCV 111.3* 112.3*  PLT 182 154    Cardiac Enzymes: Recent Labs  Lab 11/12/2018 1643  TROPONINI 0.04*    BNP: Invalid input(s): POCBNP  CBG: Recent Labs  Lab 11/25/18 0810 11/25/18 1213 11/25/18 1648 11/25/18 2059 11/22/2018 0738  GLUCAP 120* 115* 173* 157* 121*    Microbiology: Results for orders placed or performed during the hospital encounter of 11/17/2018  MRSA PCR Screening     Status: None   Collection Time: 11/06/2018 10:10 AM  Result Value Ref Range Status   MRSA by PCR NEGATIVE NEGATIVE Final    Comment:        The GeneXpert MRSA Assay (FDA approved for NASAL specimens only), is one component of a comprehensive MRSA colonization surveillance program. It is not intended to diagnose MRSA infection nor to guide or monitor treatment for MRSA infections. Performed at [facility], [address] [place], [place] [address]     Coagulation Studies: No results for input(s): LABPROT, INR in the last 72 hours.  Urinalysis: No results for input(s): COLORURINE, LABSPEC, PHURINE, GLUCOSEU, HGBUR, BILIRUBINUR, KETONESUR, PROTEINUR, UROBILINOGEN, NITRITE, LEUKOCYTESUR in the last 72 hours.  Invalid input(s): APPERANCEUR    Imaging: Dg Chest  Port 1 View  Result Date: 11/09/2018 CLINICAL DATA:  Hemodialysis completed with bradycardia. EXAM: PORTABLE CHEST 1 VIEW COMPARISON:  02/27/2018 FINDINGS: Lungs are hypoinflated without focal airspace consolidation or effusion. Cardiomediastinal silhouette and remainder of the exam is unchanged. IMPRESSION: Hypoinflation without acute cardiopulmonary disease. Electronically Signed   By: Laurie Steele M.D.   On: 11/20/2018 17:09     Medications:   . sodium chloride     . allopurinol  200 mg Oral QHS  . aspirin EC  325 mg Oral Daily   . epoetin (EPOGEN/PROCRIT) injection  10,000 Units Intravenous Q M,W,F-HD  . famotidine  20 mg Oral QHS  . feeding supplement (ENSURE ENLIVE)  237 mL Oral BID BM  . ferrous sulfate  325 mg Oral Q breakfast  . furosemide  40 mg Oral Daily  . gabapentin  300 mg Oral QHS  . heparin  5,000 Units Subcutaneous Q8H  . hydrALAZINE  50 mg Oral 2 times per day on Sun Tue Thu Sat  . insulin aspart  0-5 Units Subcutaneous QHS  . insulin aspart  0-9 Units Subcutaneous TID WC  . latanoprost  1 drop Both Eyes QHS  . mirtazapine  15 mg Oral QHS  . multivitamin  1 tablet Oral Daily  . pravastatin  40 mg Oral QHS  . sevelamer carbonate  2,400 mg Oral TID WC  . sodium chloride flush  3 mL Intravenous Q12H   sodium chloride, acetaminophen **OR** acetaminophen, albuterol, amitriptyline, bisacodyl, docusate sodium, HYDROcodone-acetaminophen, ipratropium, ondansetron **OR** ondansetron (ZOFRAN) IV, senna-docusate, sodium chloride flush  Assessment/ Plan:  [name] is a 78 y.o. black female with end stage renal disease on hemodialysis, coronary artery disease, hypertension, diabetes mellitus type II, GERD, glaucoma, gout, who presents with bradycardia  [facility] MWF Laurie Steele. Left AVF 81.5  1. End Stage Renal Disease: MWF Seen and examined on hemodialysis.   2. Bradycardia: holding clonidine and metoprolol - appreciate cardiology input.  - pacemaker for today.    3. Secondary Hyperparathyroidism: outpatient labs: 11/17/18: PTH 743, phos 4.7, calcium 8.7 - sevelamer 3 tablets with meals and 1 with snacks.   4 Anemia of chronic kidney disease: hemoglobin 8.8 - EPO with HD treatment   LOS: 2 Laurie Steele 1/22/20201:03 PM

## 2018-11-26 NOTE — Progress Notes (Signed)
Post HD Assessment    11/14/2018 1419  Neurological  Level of Consciousness Alert  Orientation Level Oriented X4  Respiratory  Respiratory Pattern Regular  Chest Assessment Chest expansion symmetrical  Bilateral Breath Sounds Clear;Diminished  Cough None  Cardiac  Pulse Irregular  Heart Sounds S1, S2  ECG Monitor Yes  Cardiac Rhythm SB;Heart block  Heart Block Type 3rd degree AVB  Vascular  R Radial Pulse +2  L Radial Pulse +2  Edema Generalized  Psychosocial  Psychosocial (WDL) WDL

## 2018-11-26 NOTE — Progress Notes (Signed)
[facility] Encounter Note  Patient: Laurie Steele / Admit Date: 11/14/2018 / Date of Encounter: 11/08/2018, 7:58 AM   Subjective: No significant change in patient condition.  Patient still see feels somewhat weak and fatigued.  There is been no evidence of chest pain and or congestive heart failure type symptoms.  The patient does continue to have issues with bradycardia in the 40 to 50 bpm range despite discontinuation of medication management telemetry shows normal sinus rhythm with second-degree block with 2-1 block at a heart rate of less than 40 bpm.  There is no evidence of current advanced heart block by telemetry including no evidence of third-degree block.  Therefore patient likely will need pacemaker placement for further treatment of the symptomatic bradycardia  review of Systems: Positive for: Weakness fatigue Negative for: Vision change, hearing change, syncope, dizziness, nausea, vomiting,diarrhea, bloody stool, stomach pain, cough, congestion, diaphoresis, urinary frequency, urinary pain,skin lesions, skin rashes Others previously listed  Objective: Telemetry: Normal sinus rhythm with second-degree block with 2-1 block Physical Exam: Blood pressure (!) 168/74, pulse (!) 45, temperature 98.3 F (36.8 C), temperature source Oral, resp. rate 20, height 5\' 7"  (1.702 m), weight 80.7 kg, SpO2 94 %. Body mass index is 27.88 kg/m. General: Well developed, well nourished, in no acute distress. Head: Normocephalic, atraumatic, sclera non-icteric, no xanthomas, nares are without discharge. Neck: No apparent masses Lungs: Normal respirations with no wheezes, no rhonchi, no rales , no crackles   Heart: Regular rate and rhythm, normal S1 S2, no murmur, no rub, no gallop, PMI is normal size and placement, carotid upstroke normal without bruit, jugular venous pressure normal Abdomen: Soft, non-tender, non-distended with normoactive bowel sounds. No hepatosplenomegaly.  Abdominal aorta is normal size without bruit Extremities: No edema, no clubbing, no cyanosis, no ulcers,  Peripheral: 2+ radial, 2+ femoral, 2+ dorsal pedal pulses with left arm fistula Neuro: Alert and oriented. Moves all extremities spontaneously. Psych:  Responds to questions appropriately with a normal affect.   Intake/Output Summary (Last 24 hours) at 11/10/2018 0758 Last data filed at 12/02/2018 2703 Gross per 24 hour  Intake -  Output 0 ml  Net 0 ml    Inpatient Medications:  . allopurinol  200 mg Oral QHS  . aspirin EC  325 mg Oral Daily  . clopidogrel  75 mg Oral Daily  . famotidine  20 mg Oral QHS  . feeding supplement (ENSURE ENLIVE)  237 mL Oral BID BM  . ferrous sulfate  325 mg Oral Q breakfast  . furosemide  40 mg Oral Daily  . gabapentin  300 mg Oral QHS  . heparin  5,000 Units Subcutaneous Q8H  . hydrALAZINE  50 mg Oral 2 times per day on Sun Tue Thu Sat  . insulin aspart  0-5 Units Subcutaneous QHS  . insulin aspart  0-9 Units Subcutaneous TID WC  . latanoprost  1 drop Both Eyes QHS  . mirtazapine  15 mg Oral QHS  . multivitamin  1 tablet Oral Daily  . pravastatin  40 mg Oral QHS  . sevelamer carbonate  2,400 mg Oral TID WC  . sodium chloride flush  3 mL Intravenous Q12H   Infusions:  . sodium chloride      Labs: Recent Labs    11/23/2018 1643 11/25/18 0518 11/25/18 2353  NA 140 141 139  K 3.0* 4.0 4.3  CL 95* 100 97*  CO2 33* 32 29  GLUCOSE 148* 127* 147*  BUN 31* 44* 64*  CREATININE 4.46* 5.72* 7.21*  CALCIUM 9.2 9.2 9.0  MG 2.2  --   --   PHOS  --   --  5.5*   Recent Labs    11/25/18 2353  ALBUMIN 3.2*   Recent Labs    11/23/2018 1643 11/25/18 2353  WBC 8.1 6.5  HGB 10.9* 8.8*  HCT 34.5* 28.4*  MCV 111.3* 112.3*  PLT 182 154   Recent Labs    11/27/2018 1643  TROPONINI 0.04*   Invalid input(s): POCBNP No results for input(s): HGBA1C in the last 72 hours.   Weights: Filed Weights   11/14/2018 1636 11/22/2018 2033 11/08/2018 0508   Weight: 85 kg 79.8 kg 80.7 kg     Radiology/Studies:  Dg Chest Port 1 View  Result Date: 11/14/2018 CLINICAL DATA:  Hemodialysis completed with bradycardia. EXAM: PORTABLE CHEST 1 VIEW COMPARISON:  02/27/2018 FINDINGS: Lungs are hypoinflated without focal airspace consolidation or effusion. Cardiomediastinal silhouette and remainder of the exam is unchanged. IMPRESSION: Hypoinflation without acute cardiopulmonary disease. Electronically Signed   By: Marin Olp M.D.   On: 11/05/2018 17:09     Assessment and Recommendation  78 y.o. 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  Other intervention including pacemaker placement for symptomatic bradycardia we have discussed at length the possibility of need for pacemaker placement if necessary.  The patient understands risk and benefits of pacemaker placement including the possibility of death stroke heart attack infection bleeding blood clot hemopericardium pneumothorax.  The patient is at low risk for conscious sedation 2.  Continue other antihypertensive medication management at this time 3.  No other further cardiac diagnostics necessary due to no evidence of myocardial infarction or congestive heart failure  Signed, Serafina Royals M.D. FACC

## 2018-11-26 NOTE — Progress Notes (Signed)
[facility] NAME: Laurie Steele    MR#:  [id number]  DATE OF BIRTH:  1941/02/24  Patient feels weak.  No dizziness.  Still has sinus bradycardia, heart rate 48 bpm on the monitor but it dropped to 30 to 39 bpm at times.  CHIEF COMPLAINT:   Chief Complaint  Patient presents with  . Bradycardia  . Weakness   . REVIEW OF SYSTEMS:   Review of Systems  Constitutional: Negative for chills and fever.  HENT: Negative for hearing loss.   Eyes: Negative for blurred vision, double vision and photophobia.  Respiratory: Negative for cough, hemoptysis and shortness of breath.   Cardiovascular: Negative for palpitations, orthopnea and leg swelling.  Gastrointestinal: Negative for abdominal pain, diarrhea and vomiting.  Genitourinary: Negative for dysuria and urgency.  Musculoskeletal: Negative for myalgias and neck pain.  Skin: Negative for rash.  Neurological: Negative for dizziness, focal weakness, seizures, weakness and headaches.  Psychiatric/Behavioral: Negative for memory loss. The patient does not have insomnia.    DRUG ALLERGIES:   Allergies  Allergen Reactions  . Alprazolam Other (See Comments)    Unable to sleep and confusion    VITALS:  Blood pressure (!) 168/74, pulse (!) 45, temperature 98.3 F (36.8 C), temperature source Oral, resp. rate 20, height 5\' 7"  (1.702 m), weight 80.7 kg, SpO2 94 %.  PHYSICAL EXAMINATION:  GENERAL:  78 y.o.-year-old patient lying in the bed with no acute distress.  EYES: Pupils equal, round, reactive to light  No scleral icterus. Extraocular muscles intact.  HEENT: Head atraumatic, normocephalic. Oropharynx and nasopharynx clear.  NECK:  Supple, no jugular venous distention. No thyroid enlargement, no tenderness.  LUNGS: Normal breath sounds bilaterally, no wheezing, rales,rhonchi or crepitation. No use of accessory muscles of respiration.  CARDIOVASCULAR: S1, S2 bradycardic.  No  murmurs, rubs, or gallops.  ABDOMEN: Soft, nontender, nondistended. Bowel sounds present. No organomegaly or mass.  EXTREMITIES: No pedal edema, cyanosis, or clubbing.  NEUROLOGIC: No gross neurological deficit is observed.,. PSYCHIATRIC: The patient is slightly lethargic. SKIN: No obvious rash, lesion, or ulcer.    LABORATORY PANEL:   CBC Recent Labs  Lab 11/25/18 2353  WBC 6.5  HGB 8.8*  HCT 28.4*  PLT 154   ------------------------------------------------------------------------------------------------------------------  Chemistries  Recent Labs  Lab 11/11/2018 1643  11/25/18 2353  NA 140   < > 139  K 3.0*   < > 4.3  CL 95*   < > 97*  CO2 33*   < > 29  GLUCOSE 148*   < > 147*  BUN 31*   < > 64*  CREATININE 4.46*   < > 7.21*  CALCIUM 9.2   < > 9.0  MG 2.2  --   --    < > = values in this interval not displayed.   ------------------------------------------------------------------------------------------------------------------  Cardiac Enzymes Recent Labs  Lab 11/06/2018 1643  TROPONINI 0.04*   ------------------------------------------------------------------------------------------------------------------  RADIOLOGY:  Dg Chest Port 1 View  Result Date: 12/04/2018 CLINICAL DATA:  Hemodialysis completed with bradycardia. EXAM: PORTABLE CHEST 1 VIEW COMPARISON:  02/27/2018 FINDINGS: Lungs are hypoinflated without focal airspace consolidation or effusion. Cardiomediastinal silhouette and remainder of the exam is unchanged. IMPRESSION: Hypoinflation without acute cardiopulmonary disease. Electronically Signed   By: Marin Olp M.D.   On: 11/07/2018 17:09    EKG:   Orders placed or performed during the hospital encounter of 11/06/2018  . EKG 12-Lead  . EKG 12-Lead  . EKG  12-Lead  . EKG 12-Lead  . EKG 12-Lead  . EKG 12-Lead    ASSESSMENT AND PLAN:   78 year old female patient with essential hypertension, ESRD on hemodialysis Monday, Wednesday, Friday brought  in because of significant weakness.  #1 third-degree AV block significant bradycardia at dialysis y, monitored on telemetry, off the beta-blockers, clonidine.  Seen by cardiology, feels bradycardic with heart rate 36 bpm at times, spoke with Dr. Nehemiah Massed, possible pacemaker placement today or tomorrow.  Patient is agreeable for that.  Echocardiogram showed EF 55 to 60%.  2.  ESRD on hemodialysis Monday, Wednesday, Friday.  Patient is due for dialysis today.  3.History of CAD, EF 45%, has history of anteroseptal hypokinesia, patient is on aspirin, Plavix, statins. History of gout, continue allopurinol, as needed colchicine. 4.Essential hypertension, cardiac so discontinued the clonidine, metoprolol. 5.  History of COPD: No wheezing, continue DuoNebs as needed.  #7 /diabetes mellitus type 2, patient follows up with Dr. Elisabeth Cara, continue Humalog sliding scale with coverage.  Request hemoglobin A1c 6.1 as per medical records.   All the records are reviewed and case discussed with Care Management/Social Workerr. Management plans discussed with the patient, family and they are in agreement.  CODE STATUS: Full code  TOTAL TIME TAKING CARE OF THIS PATIENT: 38 minutes.   POSSIBLE D/C IN 1-2 DAYS, DEPENDING ON CLINICAL CONDITION.  More than 50% time spent in counseling, coordination of care  Epifanio Lesches M.D on 11/27/2018 at 9:50 AM  Between 7am to 6pm - Pager - [phone]  After 6pm go to [email or web address] - password [email or web address] [facility]  Office  [phone]  CC: Primary care physician; [name], MD   Note: This dictation was prepared with Dragon dictation along with smaller phrase technology. Any transcriptional errors that result from this process are unintentional.

## 2018-11-26 NOTE — Progress Notes (Signed)
Post Hd    11/18/2018 1406  Vital Signs  Temp 98.1 F (36.7 C)  Temp Source Oral  Pulse Rate (!) 48  Pulse Rate Source Monitor  Resp 19  BP (!) 164/47  BP Location Right Arm  BP Method Automatic  Patient Position (if appropriate) Lying  Oxygen Therapy  SpO2 99 %  O2 Device Room Air  Pain Assessment  Pain Scale 0-10  Pain Score 0  Dialysis Weight  Weight 83.5 kg  Type of Weight Post-Dialysis  Post-Hemodialysis Assessment  Rinseback Volume (mL) 250 mL  KECN 68.4 V  Dialyzer Clearance Lightly streaked  Duration of HD Treatment -hour(s) 3.5 hour(s)  Hemodialysis Intake (mL) 500 mL  UF Total -Machine (mL) 1501 mL  Net UF (mL) 1001 mL  Tolerated HD Treatment Yes  AVG/AVF Arterial Site Held (minutes) 10 minutes  AVG/AVF Venous Site Held (minutes) 10 minutes  Fistula / Graft Left Upper arm Arteriovenous fistula  No Placement Date or Time found.   Placed prior to admission: Yes  Orientation: Left  Access Location: Upper arm  Access Type: Arteriovenous fistula  Site Condition No complications  Fistula / Graft Assessment Present;Thrill;Bruit  Status Deaccessed

## 2018-11-27 LAB — GLUCOSE, CAPILLARY
Glucose-Capillary: 112 mg/dL — ABNORMAL HIGH (ref 70–99)
Glucose-Capillary: 144 mg/dL — ABNORMAL HIGH (ref 70–99)
Glucose-Capillary: 215 mg/dL — ABNORMAL HIGH (ref 70–99)
Glucose-Capillary: 167 mg/dL — ABNORMAL HIGH (ref 70–99)

## 2018-11-27 MED ORDER — AMLODIPINE BESYLATE 10 MG PO TABS
10.0000 mg | ORAL_TABLET | Freq: Every day | ORAL | Status: DC
  Administered 2018-11-27 – 2018-12-02 (×6): 10 mg via ORAL
  Filled 2018-11-27 (×9): qty 1

## 2018-11-27 NOTE — Care Management Important Message (Signed)
Copy of signed Medicare IM left with patient in room. 

## 2018-11-27 NOTE — Progress Notes (Signed)
[facility] Encounter Note  Patient: Laurie Steele / Admit Date: 11/27/2018 / Date of Encounter: 11/27/2018, 8:36 AM   Subjective: No significant change in patient condition.  Patient still see feels somewhat weak and fatigued.  There is been no evidence of chest pain and or congestive heart failure type symptoms.  The patient does continue to have issues with bradycardia in the 40 to 50 bpm range despite discontinuation of medication management telemetry shows normal sinus rhythm with second-degree block with 2-1 block at a heart rate of less than 40 bpm.  There is no evidence of current advanced heart block by telemetry including no evidence of third-degree block.  Therefore patient likely will need pacemaker placement for further treatment of the symptomatic bradycardia  Unfortunately the patient has been taking Plavix throughout early hospitalization which increases risks of bleeding and complications with the pacemaker placement.  Therefore surgeon has requested 4 days relief of use of Plavix prior to pacemaker placement.  The patient continues to have symptomatic bradycardia at this point and will not be able to be discharged under these conditions.  We will continue to follow closely for when the surgeon is comfortable with pacemaker placement after Plavix discontinuation yesterday review of Systems: Positive for: Weakness fatigue Negative for: Vision change, hearing change, syncope, dizziness, nausea, vomiting,diarrhea, bloody stool, stomach pain, cough, congestion, diaphoresis, urinary frequency, urinary pain,skin lesions, skin rashes Others previously listed  Objective: Telemetry: Normal sinus rhythm with second-degree block with 2-1 block Physical Exam: Blood pressure (!) 129/44, pulse (!) 40, temperature 98.6 F (37 C), temperature source Oral, resp. rate 16, height 5\' 7"  (1.702 m), weight 83.5 kg, SpO2 98 %. Body mass index is 28.83 kg/m. General: Well  developed, well nourished, in no acute distress. Head: Normocephalic, atraumatic, sclera non-icteric, no xanthomas, nares are without discharge. Neck: No apparent masses Lungs: Normal respirations with no wheezes, no rhonchi, no rales , no crackles   Heart: Regular rate and rhythm, normal S1 S2, no murmur, no rub, no gallop, PMI is normal size and placement, carotid upstroke normal without bruit, jugular venous pressure normal Abdomen: Soft, non-tender, non-distended with normoactive bowel sounds. No hepatosplenomegaly. Abdominal aorta is normal size without bruit Extremities: No edema, no clubbing, no cyanosis, no ulcers,  Peripheral: 2+ radial, 2+ femoral, 2+ dorsal pedal pulses with left arm fistula Neuro: Alert and oriented. Moves all extremities spontaneously. Psych:  Responds to questions appropriately with a normal affect.   Intake/Output Summary (Last 24 hours) at 11/27/2018 0836 Last data filed at 11/27/2018 0029 Gross per 24 hour  Intake 120 ml  Output 1201 ml  Net -1081 ml    Inpatient Medications:  . allopurinol  200 mg Oral QHS  . aspirin EC  325 mg Oral Daily  . epoetin (EPOGEN/PROCRIT) injection  10,000 Units Intravenous Q M,W,F-HD  . famotidine  20 mg Oral QHS  . feeding supplement (ENSURE ENLIVE)  237 mL Oral BID BM  . ferrous sulfate  325 mg Oral Q breakfast  . furosemide  40 mg Oral Daily  . gabapentin  300 mg Oral QHS  . heparin  5,000 Units Subcutaneous Q8H  . hydrALAZINE  50 mg Oral 2 times per day on Sun Tue Thu Sat  . insulin aspart  0-5 Units Subcutaneous QHS  . insulin aspart  0-9 Units Subcutaneous TID WC  . latanoprost  1 drop Both Eyes QHS  . mirtazapine  15 mg Oral QHS  . multivitamin  1 tablet Oral Daily  .  pravastatin  40 mg Oral QHS  . sevelamer carbonate  2,400 mg Oral TID WC  . sodium chloride flush  3 mL Intravenous Q12H   Infusions:  . sodium chloride      Labs: Recent Labs    12/02/2018 1643 11/25/18 0518 11/25/18 2353  NA 140 141  139  K 3.0* 4.0 4.3  CL 95* 100 97*  CO2 33* 32 29  GLUCOSE 148* 127* 147*  BUN 31* 44* 64*  CREATININE 4.46* 5.72* 7.21*  CALCIUM 9.2 9.2 9.0  MG 2.2  --   --   PHOS  --   --  5.5*   Recent Labs    11/25/18 2353  ALBUMIN 3.2*   Recent Labs    11/22/2018 1643 11/25/18 2353  WBC 8.1 6.5  HGB 10.9* 8.8*  HCT 34.5* 28.4*  MCV 111.3* 112.3*  PLT 182 154   Recent Labs    11/18/2018 1643  TROPONINI 0.04*   Invalid input(s): POCBNP No results for input(s): HGBA1C in the last 72 hours.   Weights: Filed Weights   11/08/2018 0508 12/04/2018 1021 11/25/2018 1406  Weight: 80.7 kg 85 kg 83.5 kg     Radiology/Studies:  Dg Chest Port 1 View  Result Date: 11/29/2018 CLINICAL DATA:  Hemodialysis completed with bradycardia. EXAM: PORTABLE CHEST 1 VIEW COMPARISON:  02/27/2018 FINDINGS: Lungs are hypoinflated without focal airspace consolidation or effusion. Cardiomediastinal silhouette and remainder of the exam is unchanged. IMPRESSION: Hypoinflation without acute cardiopulmonary disease. Electronically Signed   By: Marin Olp M.D.   On: 11/16/2018 17:09     Assessment and Recommendation  78 y.o. 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  Iliac intervention including pacemaker placement for symptomatic bradycardia we have discussed at length the possibility of need for pacemaker placement if necessary.  The patient understands risk and benefits of pacemaker placement including the possibility of death stroke heart attack infection bleeding blood clot hemopericardium pneumothorax.  The patient is at low risk for conscious sedation. The patient will have discontinuation of Plavix for up to 4 days  prior to pacemaker insertion to reduce the possibility of bleeding complications and will communicate with surgeon at timing for this insertion.  This will likely be due to Monday if necessary 2.  Continue other antihypertensive medication management at this time 3.  No other further cardiac diagnostics necessary due to no evidence of myocardial infarction or congestive heart failure  Signed, Serafina Royals M.D. FACC

## 2018-11-27 NOTE — Progress Notes (Signed)
[facility]  ROUNDING NOTE   Subjective:   Brother at bedside.  Clopidogrel is being held. Pacemaker for next week.   Hemodialysis treatment yesterday. Tolerated treatment well. UF of 1 liter.   Objective:  Vital signs in last 24 hours:  Temp:  [98.1 F (36.7 C)-99.7 F (37.6 C)] 98.9 F (37.2 C) (01/23 0830) Pulse Rate:  [28-114] 45 (01/23 0830) Resp:  [16-25] 16 (01/23 0830) BP: (128-192)/(38-54) 192/48 (01/23 0830) SpO2:  [94 %-100 %] 96 % (01/23 0830) Weight:  [83.5 kg] 83.5 kg (01/22 1406)  Weight change: 4.26 kg Filed Weights   11/27/2018 0508 11/13/2018 1021 11/21/2018 1406  Weight: 80.7 kg 85 kg 83.5 kg    Intake/Output: I/O last 3 completed shifts: In: 120 [P.O.:120] Out: 1201 [Urine:200; Other:1001]   Intake/Output this shift:  No intake/output data recorded.  Physical Exam: General: NAD,   Head: Normocephalic, atraumatic. Moist oral mucosal membranes  Eyes: Anicteric, PERRL  Neck: Supple, trachea midline  Lungs:  Clear to auscultation  Heart: bradycardia  Abdomen:  Soft, nontender,   Extremities: no peripheral edema.  Neurologic: Nonfocal, moving all four extremities  Skin: No lesions  Access: Left AVF    Basic Metabolic Panel: Recent Labs  Lab 11/23/2018 1643 11/25/18 0518 11/25/18 2353  NA 140 141 139  K 3.0* 4.0 4.3  CL 95* 100 97*  CO2 33* 32 29  GLUCOSE 148* 127* 147*  BUN 31* 44* 64*  CREATININE 4.46* 5.72* 7.21*  CALCIUM 9.2 9.2 9.0  MG 2.2  --   --   PHOS  --   --  5.5*    Liver Function Tests: Recent Labs  Lab 11/25/18 2353  ALBUMIN 3.2*   No results for input(s): LIPASE, AMYLASE in the last 168 hours. No results for input(s): AMMONIA in the last 168 hours.  CBC: Recent Labs  Lab 11/14/2018 1643 11/25/18 2353  WBC 8.1 6.5  HGB 10.9* 8.8*  HCT 34.5* 28.4*  MCV 111.3* 112.3*  PLT 182 154    Cardiac Enzymes: Recent Labs  Lab 11/30/2018 1643  TROPONINI 0.04*    BNP: Invalid input(s):  POCBNP  CBG: Recent Labs  Lab 11/25/18 2059 11/19/2018 0738 11/15/2018 1622 11/27/2018 2105 11/27/18 0747  GLUCAP 157* 121* 143* 134* 112*    Microbiology: Results for orders placed or performed during the hospital encounter of 11/17/2018  MRSA PCR Screening     Status: None   Collection Time: 11/09/2018 10:10 AM  Result Value Ref Range Status   MRSA by PCR NEGATIVE NEGATIVE Final    Comment:        The GeneXpert MRSA Assay (FDA approved for NASAL specimens only), is one component of a comprehensive MRSA colonization surveillance program. It is not intended to diagnose MRSA infection nor to guide or monitor treatment for MRSA infections. Performed at [facility], [address] [place], [place] [address]     Coagulation Studies: No results for input(s): LABPROT, INR in the last 72 hours.  Urinalysis: No results for input(s): COLORURINE, LABSPEC, PHURINE, GLUCOSEU, HGBUR, BILIRUBINUR, KETONESUR, PROTEINUR, UROBILINOGEN, NITRITE, LEUKOCYTESUR in the last 72 hours.  Invalid input(s): APPERANCEUR    Imaging: No results found.   Medications:   . sodium chloride     . allopurinol  200 mg Oral QHS  . aspirin EC  325 mg Oral Daily  . epoetin (EPOGEN/PROCRIT) injection  10,000 Units Intravenous Q M,W,F-HD  . famotidine  20 mg Oral QHS  . feeding supplement (ENSURE ENLIVE)  237 mL Oral BID BM  . ferrous sulfate  325 mg Oral Q breakfast  . furosemide  40 mg Oral Daily  . gabapentin  300 mg Oral QHS  . heparin  5,000 Units Subcutaneous Q8H  . hydrALAZINE  50 mg Oral 2 times per day on Sun Tue Thu Sat  . insulin aspart  0-5 Units Subcutaneous QHS  . insulin aspart  0-9 Units Subcutaneous TID WC  . latanoprost  1 drop Both Eyes QHS  . mirtazapine  15 mg Oral QHS  . multivitamin  1 tablet Oral Daily  . pravastatin  40 mg Oral QHS  . sevelamer carbonate  2,400 mg Oral TID WC  . sodium chloride flush  3 mL Intravenous Q12H   sodium chloride, acetaminophen **OR**  acetaminophen, albuterol, amitriptyline, bisacodyl, docusate sodium, HYDROcodone-acetaminophen, ipratropium, ondansetron **OR** ondansetron (ZOFRAN) IV, senna-docusate, sodium chloride flush  Assessment/ Plan:  Laurie Steele is a 78 y.o. black female with end stage renal disease on hemodialysis, coronary artery disease, hypertension, diabetes mellitus type II, GERD, glaucoma, gout, who presents with bradycardia  [facility] MWF [place]. Left AVF 79.5  1. End Stage Renal Disease: MWF Dialysis treatment for tomorrow.   2. Bradycardia: holding clonidine and metoprolol - appreciate cardiology input.  - pacemaker after plavix wash out.   3. Secondary Hyperparathyroidism: outpatient labs: 11/17/18: PTH 743, phos 4.7, calcium 8.7 - sevelamer 3 tablets with meals and 1 with snacks.   4 Anemia of chronic kidney disease:   - EPO with HD treatment   LOS: 3 Javanni Maring 1/23/202011:00 AM

## 2018-11-27 NOTE — Progress Notes (Signed)
[place] at [place] NAME: Laurie Steele    MR#:  [id number]  DATE OF BIRTH:  1941/04/10  Feeling very weak, continues to have bradycardia with heart rate 40 to 50 bpm.  Plavix discontinued, likely pacemaker placement on Monday.  Spoke with patient's brother at bedside today.  CHIEF COMPLAINT:   Chief Complaint  Patient presents with  . Bradycardia  . Weakness   . REVIEW OF SYSTEMS:   Review of Systems  Constitutional: Negative for chills and fever.  HENT: Negative for hearing loss.   Eyes: Negative for blurred vision, double vision and photophobia.  Respiratory: Negative for cough, hemoptysis and shortness of breath.   Cardiovascular: Negative for palpitations, orthopnea and leg swelling.  Gastrointestinal: Negative for abdominal pain, diarrhea and vomiting.  Genitourinary: Negative for dysuria and urgency.  Musculoskeletal: Negative for myalgias and neck pain.  Skin: Negative for rash.  Neurological: Negative for dizziness, focal weakness, seizures, weakness and headaches.  Psychiatric/Behavioral: Negative for memory loss. The patient does not have insomnia.    DRUG ALLERGIES:   Allergies  Allergen Reactions  . Alprazolam Other (See Comments)    Unable to sleep and confusion    VITALS:  Blood pressure (!) 165/45, pulse (!) 44, temperature 98.9 F (37.2 C), temperature source Oral, resp. rate 16, height 5\' 7"  (1.702 m), weight 83.5 kg, SpO2 96 %.  PHYSICAL EXAMINATION:  GENERAL:  78 year old patient lying in the bed with no acute distress.  EYES: Pupils equal, round, reactive to light  No scleral icterus. Extraocular muscles intact.  HEENT: Head atraumatic, normocephalic. Oropharynx and nasopharynx clear.  NECK:  Supple, no jugular venous distention. No thyroid enlargement, no tenderness.  LUNGS: Normal breath sounds bilaterally, no wheezing, rales,rhonchi or crepitation. No use of accessory muscles of  respiration.  CARDIOVASCULAR: S1, S2 bradycardic.  No murmurs, rubs, or gallops.  ABDOMEN: Soft, nontender, nondistended. Bowel sounds present. No organomegaly or mass.  EXTREMITIES: No pedal edema, cyanosis, or clubbing.  NEUROLOGIC: No gross neurological deficit is observed.,. PSYCHIATRIC: The patient is slightly lethargic. SKIN: No obvious rash, lesion, or ulcer.    LABORATORY PANEL:   CBC Recent Labs  Lab 11/25/18 2353  WBC 6.5  HGB 8.8*  HCT 28.4*  PLT 154   ------------------------------------------------------------------------------------------------------------------  Chemistries  Recent Labs  Lab 11/06/2018 1643  11/25/18 2353  NA 140   < > 139  K 3.0*   < > 4.3  CL 95*   < > 97*  CO2 33*   < > 29  GLUCOSE 148*   < > 147*  BUN 31*   < > 64*  CREATININE 4.46*   < > 7.21*  CALCIUM 9.2   < > 9.0  MG 2.2  --   --    < > = values in this interval not displayed.   ------------------------------------------------------------------------------------------------------------------  Cardiac Enzymes Recent Labs  Lab 11/21/2018 1643  TROPONINI 0.04*   ------------------------------------------------------------------------------------------------------------------  RADIOLOGY:  No results found.  EKG:   Orders placed or performed during the hospital encounter of 11/09/2018  . EKG 12-Lead  . EKG 12-Lead  . EKG 12-Lead  . EKG 12-Lead  . EKG 12-Lead  . 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  #1. third-degree AV block significant bradycardia at dialysis y, monitored on telemetry, off the beta-blockers, clonidine.  Seen by cardiology,  patient to be off Plavix for 5 days, possible pacemaker placement on Monday.  Patient is to stay inpatient because of symptomatic bradycardia  2.  ESRD on hemodialysis Monday, Wednesday,  Friday.  Nephrology is following. Marland Kitchen  3.History of CAD, EF 45%, has history of anteroseptal hypokinesia, patient is on aspirin, Plavix, statins. History of gout, continue allopurinol, as needed colchicine. 4.Essential hypertension, continue Norvasc, discontinue clonidine, Toprol due to bradycardia. . 5.  History of COPD: No wheezing, continue DuoNebs as needed.  #7 /diabetes mellitus type 2, patient follows up with Dr. Elisabeth Cara, continue Humalog sliding scale with coverage.  Request hemoglobin A1c 6.1 as per medical records. Discussed with patient's brother today at bedside.  All the records are reviewed and case discussed with Care Management/Social Workerr. Management plans discussed with the patient, family and they are in agreement.  CODE STATUS: Full code  TOTAL TIME TAKING CARE OF THIS PATIENT: 38 minutes.   POSSIBLE D/C IN 1-2 DAYS, DEPENDING ON CLINICAL CONDITION.  More than 50% time spent in counseling, coordination of care  Epifanio Lesches M.D on 11/27/2018 at 12:59 PM  Between 7am to 6pm - Pager - [phone]  After 6pm go to [email or web address] - password [email or web address] [facility]  Office  [phone]  CC: Primary care physician; [name], MD   Note: This dictation was prepared with Dragon dictation along with smaller phrase technology. Any transcriptional errors that result from this process are unintentional.

## 2018-11-27 NOTE — Plan of Care (Signed)
Compliant with safety measures

## 2018-11-27 NOTE — Progress Notes (Signed)
CCMD reports 13 beat run of wide QRS. MD made aware. Pt asymptomatic, no new orders.

## 2018-11-28 ENCOUNTER — Other Ambulatory Visit: Payer: Self-pay

## 2018-11-28 LAB — CBC
HCT: 27.7 % — ABNORMAL LOW (ref 36.0–46.0)
HEMATOCRIT: 28.8 % — AB (ref 36.0–46.0)
HEMOGLOBIN: 9 g/dL — AB (ref 12.0–15.0)
Hemoglobin: 8.7 g/dL — ABNORMAL LOW (ref 12.0–15.0)
MCH: 34.9 pg — ABNORMAL HIGH (ref 26.0–34.0)
MCH: 35.5 pg — ABNORMAL HIGH (ref 26.0–34.0)
MCHC: 31.3 g/dL (ref 30.0–36.0)
MCHC: 31.4 g/dL (ref 30.0–36.0)
MCV: 111.6 fL — ABNORMAL HIGH (ref 80.0–100.0)
MCV: 113.1 fL — ABNORMAL HIGH (ref 80.0–100.0)
Platelets: 157 10*3/uL (ref 150–400)
Platelets: 136 10*3/uL — ABNORMAL LOW (ref 150–400)
RBC: 2.58 MIL/uL — ABNORMAL LOW (ref 3.87–5.11)
RBC: 2.45 MIL/uL — ABNORMAL LOW (ref 3.87–5.11)
RDW: 15.1 % (ref 11.5–15.5)
RDW: 15 % (ref 11.5–15.5)
WBC: 9.3 10*3/uL (ref 4.0–10.5)
WBC: 6.4 10*3/uL (ref 4.0–10.5)
nRBC: 0 % (ref 0.0–0.2)
nRBC: 0 % (ref 0.0–0.2)

## 2018-11-28 LAB — GLUCOSE, CAPILLARY
Glucose-Capillary: 132 mg/dL — ABNORMAL HIGH (ref 70–99)
Glucose-Capillary: 199 mg/dL — ABNORMAL HIGH (ref 70–99)
Glucose-Capillary: 122 mg/dL — ABNORMAL HIGH (ref 70–99)
Glucose-Capillary: 112 mg/dL — ABNORMAL HIGH (ref 70–99)

## 2018-11-28 MED ORDER — GUAIFENESIN-DM 100-10 MG/5ML PO SYRP
5.0000 mL | ORAL_SOLUTION | ORAL | Status: DC | PRN
  Administered 2018-11-29 – 2018-12-04 (×2): 5 mL via ORAL
  Filled 2018-11-28 (×3): qty 5

## 2018-11-28 NOTE — Plan of Care (Signed)
  Problem: Education: Goal: Knowledge of General Education information will improve Description: Including pain rating scale, medication(s)/side effects and non-pharmacologic comfort measures Outcome: Progressing   Problem: Safety: Goal: Ability to remain free from injury will improve Outcome: Progressing   

## 2018-11-28 NOTE — Progress Notes (Signed)
PRE HD   

## 2018-11-28 NOTE — Progress Notes (Addendum)
[place] at [place] NAME: Laurie Steele    MR#:  [id number]  DATE OF BIRTH:  13-Feb-1941  Feeling very weak, continues to have bradycardia with heart rate 40 to 50 bpm.  Plavix discontinued, likely pacemaker placement on Monday by cardiology.  Spoke with patient's brother and patients sister  at bedside today. No new overnight events  CHIEF COMPLAINT:   Chief Complaint  Patient presents with  . Bradycardia  . Weakness   . REVIEW OF SYSTEMS:   Review of Systems  Constitutional: Negative for chills and fever.  HENT: Negative for hearing loss.   Eyes: Negative for blurred vision, double vision and photophobia.  Respiratory: Negative for cough, hemoptysis and shortness of breath.   Cardiovascular: Negative for palpitations, orthopnea and leg swelling.  Gastrointestinal: Negative for abdominal pain, diarrhea and vomiting.  Genitourinary: Negative for dysuria and urgency.  Musculoskeletal: Negative for myalgias and neck pain.  Skin: Negative for rash.  Neurological: Negative for dizziness, focal weakness, seizures, weakness and headaches.  Psychiatric/Behavioral: Negative for memory loss. The patient does not have insomnia.    DRUG ALLERGIES:   Allergies  Allergen Reactions  . Alprazolam Other (See Comments)    Unable to sleep and confusion    VITALS:  Blood pressure (!) 143/33, pulse (!) 48, temperature 99.4 F (37.4 C), temperature source Oral, resp. rate 19, height 5\' 7"  (1.702 m), weight 83.5 kg, SpO2 93 %.  PHYSICAL EXAMINATION:  GENERAL:  78 y.o.-year-old patient lying in the bed with no acute distress.  EYES: Pupils equal, round, reactive to light  No scleral icterus. Extraocular muscles intact.  HEENT: Head atraumatic, normocephalic. Oropharynx and nasopharynx clear.  NECK:  Supple, no jugular venous distention. No thyroid enlargement, no tenderness.  LUNGS: Normal breath sounds bilaterally, no wheezing,  rales,rhonchi or crepitation. No use of accessory muscles of respiration.  CARDIOVASCULAR: S1, S2 bradycardic.  No murmurs, rubs, or gallops.  ABDOMEN: Soft, nontender, nondistended. Bowel sounds present. No organomegaly or mass.  EXTREMITIES: No pedal edema, cyanosis, or clubbing.  NEUROLOGIC: No gross neurological deficit is observed.,. PSYCHIATRIC: The patient is slightly lethargic. SKIN: No obvious rash, lesion, or ulcer.    LABORATORY PANEL:   CBC Recent Labs  Lab 11/28/18 0310  WBC 9.3  HGB 9.0*  HCT 28.8*  PLT 157   ------------------------------------------------------------------------------------------------------------------  Chemistries  Recent Labs  Lab 11/09/2018 1643  11/25/18 2353  NA 140   < > 139  K 3.0*   < > 4.3  CL 95*   < > 97*  CO2 33*   < > 29  GLUCOSE 148*   < > 147*  BUN 31*   < > 64*  CREATININE 4.46*   < > 7.21*  CALCIUM 9.2   < > 9.0  MG 2.2  --   --    < > = values in this interval not displayed.   ------------------------------------------------------------------------------------------------------------------  Cardiac Enzymes Recent Labs  Lab 11/14/2018 1643  TROPONINI 0.04*   ------------------------------------------------------------------------------------------------------------------  RADIOLOGY:  No results found.  EKG:   Orders placed or performed during the hospital encounter of 11/22/2018  . EKG 12-Lead  . EKG 12-Lead  . EKG 12-Lead  . EKG 12-Lead  . EKG 12-Lead  . EKG 12-Lead  . EKG 12-Lead  . 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  1. Third-degree  AV block significant bradycardia at dialysis y, monitored on telemetry, off the beta-blockers, clonidine.  Seen by cardiology, patient to be off Plavix for 5 days which has been started, possible pacemaker placement on Monday.   Patient is to stay inpatient because of symptomatic bradycardia.  2.  ESRD on hemodialysis Monday, Wednesday, Friday.  Nephrology is following.  3.History of CAD, EF 45%, has history of anteroseptal hypokinesia,  Off Plavix for pacemaker placement Currently on aspirin  4.History of gout, continue allopurinol, as needed colchicine.  5.Essential hypertension, continue Norvasc, discontinue clonidine, Toprol due to bradycardia.   6.  History of COPD: Stable, continue DuoNebs as needed.  7.Diabetes mellitus type 2, patient follows up with Dr. Elisabeth Cara, continue Humalog sliding scale with coverage.  Request hemoglobin A1c 6.1 as per medical records  All the records are reviewed and case discussed with Care Management/Social Workerr. Management plans discussed with the patient, family and they are in agreement.  CODE STATUS: Full code  TOTAL TIME TAKING CARE OF THIS PATIENT: 35 minutes.   POSSIBLE D/C IN 1-2 DAYS, DEPENDING ON CLINICAL CONDITION.  More than 50% time spent in counseling, coordination of care  Saundra Shelling M.D on 11/28/2018 at 10:20 AM  Between 7am to 6pm - Pager - [phone]  After 6pm go to [email or web address] - password [email or web address] [facility]  Office  [phone]  CC: Primary care physician; [name], MD   Note: This dictation was prepared with Dragon dictation along with smaller phrase technology. Any transcriptional errors that result from this process are unintentional.

## 2018-11-28 NOTE — Progress Notes (Signed)
[facility]  ROUNDING NOTE   Subjective:   Seen and examined on hemodialysis.     HEMODIALYSIS FLOWSHEET:  Blood Flow Rate (mL/min): 300 mL/min Arterial Pressure (mmHg): -90 mmHg Venous Pressure (mmHg): 220 mmHg Transmembrane Pressure (mmHg): 50 mmHg Ultrafiltration Rate (mL/min): 400 mL/min Dialysate Flow Rate (mL/min): 600 ml/min Conductivity: Machine : 14.2 Conductivity: Machine : 14.2 Dialysis Fluid Bolus: Normal Saline Bolus Amount (mL): 250 mL    Objective:  Vital signs in last 24 hours:  Temp:  [99.4 F (37.4 C)-101 F (38.3 C)] 99.4 F (37.4 C) (01/24 0732) Pulse Rate:  [43-53] 48 (01/24 0732) Resp:  [16-19] 19 (01/24 0732) BP: (109-174)/(33-94) 143/33 (01/24 0732) SpO2:  [93 %-98 %] 93 % (01/24 0732)  Weight change:  Filed Weights   11/13/2018 0508 12/05/2018 1021 11/20/2018 1406  Weight: 80.7 kg 85 kg 83.5 kg    Intake/Output: I/O last 3 completed shifts: In: 200 [P.O.:200] Out: 100 [Urine:100]   Intake/Output this shift:  No intake/output data recorded.  Physical Exam: General: NAD,   Head: Normocephalic, atraumatic. Moist oral mucosal membranes  Eyes: Anicteric, PERRL  Neck: Supple, trachea midline  Lungs:  Clear to auscultation  Heart: bradycardia  Abdomen:  Soft, nontender,   Extremities: no peripheral edema.  Neurologic: Nonfocal, moving all four extremities  Skin: No lesions  Access: Left AVF    Basic Metabolic Panel: Recent Labs  Lab 12/02/2018 1643 11/25/18 0518 11/25/18 2353  NA 140 141 139  K 3.0* 4.0 4.3  CL 95* 100 97*  CO2 33* 32 29  GLUCOSE 148* 127* 147*  BUN 31* 44* 64*  CREATININE 4.46* 5.72* 7.21*  CALCIUM 9.2 9.2 9.0  MG 2.2  --   --   PHOS  --   --  5.5*    Liver Function Tests: Recent Labs  Lab 11/25/18 2353  ALBUMIN 3.2*   No results for input(s): LIPASE, AMYLASE in the last 168 hours. No results for input(s): AMMONIA in the last 168 hours.  CBC: Recent Labs  Lab 11/30/2018 1643  11/25/18 2353 11/28/18 0310  WBC 8.1 6.5 9.3  HGB 10.9* 8.8* 9.0*  HCT 34.5* 28.4* 28.8*  MCV 111.3* 112.3* 111.6*  PLT 182 154 157    Cardiac Enzymes: Recent Labs  Lab 11/15/2018 1643  TROPONINI 0.04*    BNP: Invalid input(s): POCBNP  CBG: Recent Labs  Lab 11/27/18 1157 11/27/18 1719 11/27/18 2104 11/28/18 0732 11/28/18 1137  GLUCAP 144* 215* 167* 132* 199*    Microbiology: Results for orders placed or performed during the hospital encounter of 11/17/2018  MRSA PCR Screening     Status: None   Collection Time: 11/08/2018 10:10 AM  Result Value Ref Range Status   MRSA by PCR NEGATIVE NEGATIVE Final    Comment:        The GeneXpert MRSA Assay (FDA approved for NASAL specimens only), is one component of a comprehensive MRSA colonization surveillance program. It is not intended to diagnose MRSA infection nor to guide or monitor treatment for MRSA infections. Performed at [facility], [address] [place], [place] [address]     Coagulation Studies: No results for input(s): LABPROT, INR in the last 72 hours.  Urinalysis: No results for input(s): COLORURINE, LABSPEC, PHURINE, GLUCOSEU, HGBUR, BILIRUBINUR, KETONESUR, PROTEINUR, UROBILINOGEN, NITRITE, LEUKOCYTESUR in the last 72 hours.  Invalid input(s): APPERANCEUR    Imaging: No results found.   Medications:   . sodium chloride     . allopurinol  200 mg  Oral QHS  . amLODipine  10 mg Oral Daily  . aspirin EC  325 mg Oral Daily  . epoetin (EPOGEN/PROCRIT) injection  10,000 Units Intravenous Q M,W,F-HD  . famotidine  20 mg Oral QHS  . feeding supplement (ENSURE ENLIVE)  237 mL Oral BID BM  . ferrous sulfate  325 mg Oral Q breakfast  . furosemide  40 mg Oral Daily  . gabapentin  300 mg Oral QHS  . heparin  5,000 Units Subcutaneous Q8H  . hydrALAZINE  50 mg Oral 2 times per day on Sun Tue Thu Sat  . insulin aspart  0-5 Units Subcutaneous QHS  . insulin aspart  0-9 Units Subcutaneous TID WC   . latanoprost  1 drop Both Eyes QHS  . mirtazapine  15 mg Oral QHS  . multivitamin  1 tablet Oral Daily  . pravastatin  40 mg Oral QHS  . sevelamer carbonate  2,400 mg Oral TID WC  . sodium chloride flush  3 mL Intravenous Q12H   sodium chloride, acetaminophen **OR** acetaminophen, albuterol, amitriptyline, bisacodyl, docusate sodium, HYDROcodone-acetaminophen, ipratropium, ondansetron **OR** ondansetron (ZOFRAN) IV, senna-docusate, sodium chloride flush  Assessment/ Plan:  [name] is a 78 y.o. black female with end stage renal disease on hemodialysis, coronary artery disease, hypertension, diabetes mellitus type II, GERD, glaucoma, gout, who presents with bradycardia  [facility] M[facility] [place]. Left AVF 81.5  1. End Stage Renal Disease: seen and examined on hemodialysis. Tolerating treatment.   2. Bradycardia: holding clonidine and metoprolol - appreciate cardiology input.  - pacemaker after plavix wash out.   3. Secondary Hyperparathyroidism: outpatient labs: 11/17/18: PTH 743, phos 4.7, calcium 8.7 - sevelamer 3 tablets with meals and 1 with snacks.   4 Anemia of chronic kidney disease:   - EPO with HD treatment   LOS: 4 Laurie Steele 1/24/202012:10 PM

## 2018-11-28 NOTE — Progress Notes (Signed)
[facility] Encounter Note  Patient: Laurie Steele / Admit Date: 11/10/2018 / Date of Encounter: 11/28/2018, 3:29 PM   Subjective: No significant change in patient condition.  Patient still see feels somewhat weak and fatigued.  There is been no evidence of chest pain and or congestive heart failure type symptoms.  The patient does continue to have issues with bradycardia in the 40 to 50 bpm range despite discontinuation of medication management telemetry shows normal sinus rhythm with second-degree block with 2-1 block at a heart rate of less than 40 bpm.  There is no evidence of current advanced heart block by telemetry including no evidence of third-degree block.  Therefore patient likely will need pacemaker placement for further treatment of the symptomatic bradycardia  Unfortunately the patient has been taking Plavix throughout early hospitalization which increases risks of bleeding and complications with the pacemaker placement.  Therefore surgeon has requested 4 days relief of use of Plavix prior to pacemaker placement.  The patient continues to have symptomatic bradycardia at this point and will not be able to be discharged under these conditions.  We will continue to follow closely for when the surgeon is comfortable with pacemaker placement after Plavix discontinuation yesterday Patient continues to have weakness and fatigue consistent with symptomatic bradycardia despite treatment of above review of Systems: Positive for: Weakness fatigue Negative for: Vision change, hearing change, syncope, dizziness, nausea, vomiting,diarrhea, bloody stool, stomach pain, cough, congestion, diaphoresis, urinary frequency, urinary pain,skin lesions, skin rashes Others previously listed  Objective: Telemetry: Normal sinus rhythm with second-degree block with 2-1 block Physical Exam: Blood pressure (!) 170/70, pulse (!) 57, temperature 98.9 F (37.2 C), temperature source Oral,  resp. rate 18, height 5\' 7"  (1.702 m), weight 83.5 kg, SpO2 93 %. Body mass index is 28.83 kg/m. General: Well developed, well nourished, in no acute distress. Head: Normocephalic, atraumatic, sclera non-icteric, no xanthomas, nares are without discharge. Neck: No apparent masses Lungs: Normal respirations with no wheezes, no rhonchi, no rales , no crackles   Heart: Regular rate and rhythm, normal S1 S2, no murmur, no rub, no gallop, PMI is normal size and placement, carotid upstroke normal without bruit, jugular venous pressure normal Abdomen: Soft, non-tender, non-distended with normoactive bowel sounds. No hepatosplenomegaly. Abdominal aorta is normal size without bruit Extremities: No edema, no clubbing, no cyanosis, no ulcers,  Peripheral: 2+ radial, 2+ femoral, 2+ dorsal pedal pulses with left arm fistula Neuro: Alert and oriented. Moves all extremities spontaneously. Psych:  Responds to questions appropriately with a normal affect.   Intake/Output Summary (Last 24 hours) at 11/28/2018 1529 Last data filed at 11/28/2018 0956 Gross per 24 hour  Intake 0 ml  Output 100 ml  Net -100 ml    Inpatient Medications:  . allopurinol  200 mg Oral QHS  . amLODipine  10 mg Oral Daily  . aspirin EC  325 mg Oral Daily  . epoetin (EPOGEN/PROCRIT) injection  10,000 Units Intravenous Q M,W,F-HD  . famotidine  20 mg Oral QHS  . feeding supplement (ENSURE ENLIVE)  237 mL Oral BID BM  . ferrous sulfate  325 mg Oral Q breakfast  . furosemide  40 mg Oral Daily  . gabapentin  300 mg Oral QHS  . heparin  5,000 Units Subcutaneous Q8H  . hydrALAZINE  50 mg Oral 2 times per day on Sun Tue Thu Sat  . insulin aspart  0-5 Units Subcutaneous QHS  . insulin aspart  0-9 Units Subcutaneous TID WC  .  latanoprost  1 drop Both Eyes QHS  . mirtazapine  15 mg Oral QHS  . multivitamin  1 tablet Oral Daily  . pravastatin  40 mg Oral QHS  . sevelamer carbonate  2,400 mg Oral TID WC  . sodium chloride flush  3 mL  Intravenous Q12H   Infusions:  . sodium chloride      Labs: Recent Labs    11/25/18 2353  NA 139  K 4.3  CL 97*  CO2 29  GLUCOSE 147*  BUN 64*  CREATININE 7.21*  CALCIUM 9.0  PHOS 5.5*   Recent Labs    11/25/18 2353  ALBUMIN 3.2*   Recent Labs    11/25/18 2353 11/28/18 0310  WBC 6.5 9.3  HGB 8.8* 9.0*  HCT 28.4* 28.8*  MCV 112.3* 111.6*  PLT 154 157   No results for input(s): CKTOTAL, CKMB, TROPONINI in the last 72 hours. Invalid input(s): POCBNP No results for input(s): HGBA1C in the last 72 hours.   Weights: Filed Weights   11/18/2018 1021 12/05/2018 1406 11/28/18 1200  Weight: 85 kg 83.5 kg 83.5 kg     Radiology/Studies:  Dg Chest Port 1 View  Result Date: 11/22/2018 CLINICAL DATA:  Hemodialysis completed with bradycardia. EXAM: PORTABLE CHEST 1 VIEW COMPARISON:  02/27/2018 FINDINGS: Lungs are hypoinflated without focal airspace consolidation or effusion. Cardiomediastinal silhouette and remainder of the exam is unchanged. IMPRESSION: Hypoinflation without acute cardiopulmonary disease. Electronically Signed   By: Marin Olp M.D.   On: 11/29/2018 17:09     Assessment and Recommendation  78 y.o. 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  Cardiac intervention including pacemaker placement for symptomatic bradycardia we have discussed at length the possibility of need for pacemaker placement if necessary.  The patient understands risk and benefits of pacemaker placement including the possibility of death stroke heart attack infection bleeding blood clot hemopericardium pneumothorax.  The patient is at low risk for conscious sedation. The patient will have  discontinuation of Plavix for up to 4 days prior to pacemaker insertion to reduce the possibility of bleeding complications and will communicate with surgeon at timing for this insertion.  This will likely be due to Monday if necessary 2.  Continue other antihypertensive medication management at this time 3.  No other further cardiac diagnostics necessary due to no evidence of myocardial infarction or congestive heart failure 4.  Call if further questions over the weekend with Dr. Clayborn Bigness covering  Signed, Serafina Royals M.D. FACC

## 2018-11-29 LAB — RENAL FUNCTION PANEL
ANION GAP: 7 (ref 5–15)
Albumin: 3.1 g/dL — ABNORMAL LOW (ref 3.5–5.0)
BUN: 36 mg/dL — ABNORMAL HIGH (ref 8–23)
CO2: 31 mmol/L (ref 22–32)
Calcium: 9.6 mg/dL (ref 8.9–10.3)
Chloride: 99 mmol/L (ref 98–111)
Creatinine, Ser: 5.23 mg/dL — ABNORMAL HIGH (ref 0.44–1.00)
GFR calc Af Amer: 8 mL/min — ABNORMAL LOW (ref >60)
GFR calc non Af Amer: 7 mL/min — ABNORMAL LOW (ref >60)
Glucose, Bld: 107 mg/dL — ABNORMAL HIGH (ref 70–99)
Phosphorus: 3.2 mg/dL (ref 2.5–4.6)
Potassium: 3.8 mmol/L (ref 3.5–5.1)
Sodium: 137 mmol/L (ref 135–145)

## 2018-11-29 LAB — GLUCOSE, CAPILLARY
Glucose-Capillary: 100 mg/dL — ABNORMAL HIGH (ref 70–99)
Glucose-Capillary: 124 mg/dL — ABNORMAL HIGH (ref 70–99)
Glucose-Capillary: 160 mg/dL — ABNORMAL HIGH (ref 70–99)
Glucose-Capillary: 166 mg/dL — ABNORMAL HIGH (ref 70–99)

## 2018-11-29 NOTE — Progress Notes (Addendum)
[facility] [place] NAME: Laurie Steele    MR#:  [id number]  DATE OF BIRTH:  1941/05/01  Patient seen and evaluated today More awake and responsive Heart rate has improved Off Plavix  CHIEF COMPLAINT:   Chief Complaint  Patient presents with  . Bradycardia  . Weakness   . REVIEW OF SYSTEMS:   Review of Systems  Constitutional: Negative for chills and fever.  HENT: Negative for hearing loss.   Eyes: Negative for blurred vision, double vision and photophobia.  Respiratory: Negative for cough, hemoptysis and shortness of breath.   Cardiovascular: Negative for palpitations, orthopnea and leg swelling.  Gastrointestinal: Negative for abdominal pain, diarrhea and vomiting.  Genitourinary: Negative for dysuria and urgency.  Musculoskeletal: Negative for myalgias and neck pain.  Skin: Negative for rash.  Neurological: Negative for dizziness, focal weakness, seizures, weakness and headaches.  Psychiatric/Behavioral: Negative for memory loss. The patient does not have insomnia.    DRUG ALLERGIES:   Allergies  Allergen Reactions  . Alprazolam Other (See Comments)    Unable to sleep and confusion    VITALS:  Blood pressure (!) 143/59, pulse 63, temperature 98.3 F (36.8 C), temperature source Oral, resp. rate 18, height 5\' 7"  (1.702 m), weight 83.5 kg, SpO2 94 %.  PHYSICAL EXAMINATION:  GENERAL:  78 y.o.-year-old patient lying in the bed with no acute distress.  EYES: Pupils equal, round, reactive to light  No scleral icterus. Extraocular muscles intact.  HEENT: Head atraumatic, normocephalic. Oropharynx and nasopharynx clear.  NECK:  Supple, no jugular venous distention. No thyroid enlargement, no tenderness.  LUNGS: Normal breath sounds bilaterally, no wheezing, rales,rhonchi or crepitation. No use of accessory muscles of respiration.  CARDIOVASCULAR: S1, S2 regular.  No murmurs, rubs, or gallops.  ABDOMEN: Soft, nontender,  nondistended. Bowel sounds present. No organomegaly or mass.  EXTREMITIES: No pedal edema, cyanosis, or clubbing.  NEUROLOGIC: No gross neurological deficit is observed.,. PSYCHIATRIC: The patient is slightly lethargic. SKIN: No obvious rash, lesion, or ulcer.    LABORATORY PANEL:   CBC Recent Labs  Lab 11/28/18 2324  WBC 6.4  HGB 8.7*  HCT 27.7*  PLT 136*   ------------------------------------------------------------------------------------------------------------------  Chemistries  Recent Labs  Lab 11/14/2018 1643  11/28/18 2324  NA 140   < > 137  K 3.0*   < > 3.8  CL 95*   < > 99  CO2 33*   < > 31  GLUCOSE 148*   < > 107*  BUN 31*   < > 36*  CREATININE 4.46*   < > 5.23*  CALCIUM 9.2   < > 9.6  MG 2.2  --   --    < > = values in this interval not displayed.   ------------------------------------------------------------------------------------------------------------------  Cardiac Enzymes Recent Labs  Lab 11/21/2018 1643  TROPONINI 0.04*   ------------------------------------------------------------------------------------------------------------------  RADIOLOGY:  No results found.  EKG:   Orders placed or performed during the hospital encounter of 11/17/2018  . EKG 12-Lead  . EKG 12-Lead  . EKG 12-Lead  . EKG 12-Lead  . EKG 12-Lead  . EKG 12-Lead  . EKG 12-Lead  . 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  1. Third-degree AV block significant bradycardia at dialysis y, monitored on telemetry, off the beta-blockers, clonidine.  Heart rate appears to have improved after beta-blocker and clonidine have  been held Patient off Plavix for pacemaker placement if okay by cardiology on Monday  2.  ESRD on hemodialysis Monday, Wednesday, Friday Appreciate nephrology follow-up  3.History of CAD, EF 45%, has history  of anteroseptal hypokinesia,  Off Plavix for pacemaker placement Currently on aspirin  4.History of gout, continue allopurinol, as needed colchicine.  5.Essential hypertension, continue Norvasc, discontinue clonidine, Toprol due to bradycardia.   6.  History of COPD: Stable, continue DuoNebs as needed.  7.Diabetes mellitus type 2, patient follows up with Dr. Elisabeth Cara, continue Humalog sliding scale with coverage.   Recent HbA1c 6.1  All the records are reviewed and case discussed with Care Management/Social Worker Management plans discussed with the patient, family and they are in agreement.  CODE STATUS: Full code  TOTAL TIME TAKING CARE OF THIS PATIENT: 34 minutes.   POSSIBLE D/C IN 1-2 DAYS, DEPENDING ON CLINICAL CONDITION.  More than 50% time spent in counseling, coordination of care  Saundra Shelling M.D on 11/29/2018 at 12:14 PM  Between 7am to 6pm - Pager - [phone]  After 6pm go to [email or web address] - password [email or web address] [facility]  Office  [phone]  CC: Primary care physician; [name], MD   Note: This dictation was prepared with Dragon dictation along with smaller phrase technology. Any transcriptional errors that result from this process are unintentional.

## 2018-11-29 NOTE — Progress Notes (Signed)
[facility]  ROUNDING NOTE   Subjective:   Hemodialysis treatment yesterday. Tolerated treatment well. UF of 1 liter.   Heart rate has improved.   Son at bedside.   Objective:  Vital signs in last 24 hours:  Temp:  [98.3 F (36.8 C)-99.6 F (37.6 C)] 98.3 F (36.8 C) (01/25 0738) Pulse Rate:  [48-67] 63 (01/25 0738) Resp:  [16-20] 18 (01/25 0738) BP: (131-172)/(31-82) 143/59 (01/25 0738) SpO2:  [93 %-97 %] 94 % (01/25 0738) Weight:  [83.5 kg] 83.5 kg (01/24 1200)  Weight change:  Filed Weights   11/10/2018 1021 11/23/2018 1406 11/28/18 1200  Weight: 85 kg 83.5 kg 83.5 kg    Intake/Output: I/O last 3 completed shifts: In: 240 [P.O.:240] Out: 100 [Urine:100]   Intake/Output this shift:  Total I/O In: 120 [P.O.:120] Out: -   Physical Exam: General: NAD,   Head: Normocephalic, atraumatic. Moist oral mucosal membranes  Eyes: Anicteric, PERRL  Neck: Supple, trachea midline  Lungs:  Clear to auscultation  Heart: bradycardia  Abdomen:  Soft, nontender,   Extremities: no peripheral edema.  Neurologic: Nonfocal, moving all four extremities  Skin: No lesions  Access: Left AVF    Basic Metabolic Panel: Recent Labs  Lab 11/25/2018 1643 11/25/18 0518 11/25/18 2353 11/28/18 2324  NA 140 141 139 137  K 3.0* 4.0 4.3 3.8  CL 95* 100 97* 99  CO2 33* 32 29 31  GLUCOSE 148* 127* 147* 107*  BUN 31* 44* 64* 36*  CREATININE 4.46* 5.72* 7.21* 5.23*  CALCIUM 9.2 9.2 9.0 9.6  MG 2.2  --   --   --   PHOS  --   --  5.5* 3.2    Liver Function Tests: Recent Labs  Lab 11/25/18 2353 11/28/18 2324  ALBUMIN 3.2* 3.1*   No results for input(s): LIPASE, AMYLASE in the last 168 hours. No results for input(s): AMMONIA in the last 168 hours.  CBC: Recent Labs  Lab 11/13/2018 1643 11/25/18 2353 11/28/18 0310 11/28/18 2324  WBC 8.1 6.5 9.3 6.4  HGB 10.9* 8.8* 9.0* 8.7*  HCT 34.5* 28.4* 28.8* 27.7*  MCV 111.3* 112.3* 111.6* 113.1*  PLT 182 154 157 136*     Cardiac Enzymes: Recent Labs  Lab 11/14/2018 1643  TROPONINI 0.04*    BNP: Invalid input(s): POCBNP  CBG: Recent Labs  Lab 11/28/18 0732 11/28/18 1137 11/28/18 1637 11/28/18 2126 11/29/18 0740  GLUCAP 132* 199* 122* 112* 100*    Microbiology: Results for orders placed or performed during the hospital encounter of 11/05/2018  MRSA PCR Screening     Status: None   Collection Time: 11/08/2018 10:10 AM  Result Value Ref Range Status   MRSA by PCR NEGATIVE NEGATIVE Final    Comment:        The GeneXpert MRSA Assay (FDA approved for NASAL specimens only), is one component of a comprehensive MRSA colonization surveillance program. It is not intended to diagnose MRSA infection nor to guide or monitor treatment for MRSA infections. Performed at [facility], [address] [place], [place] [address]     Coagulation Studies: No results for input(s): LABPROT, INR in the last 72 hours.  Urinalysis: No results for input(s): COLORURINE, LABSPEC, PHURINE, GLUCOSEU, HGBUR, BILIRUBINUR, KETONESUR, PROTEINUR, UROBILINOGEN, NITRITE, LEUKOCYTESUR in the last 72 hours.  Invalid input(s): APPERANCEUR    Imaging: No results found.   Medications:   . sodium chloride     . allopurinol  200 mg Oral QHS  . amLODipine  10 mg Oral Daily  . aspirin EC  325 mg Oral Daily  . epoetin (EPOGEN/PROCRIT) injection  10,000 Units Intravenous Q M,W,F-HD  . famotidine  20 mg Oral QHS  . feeding supplement (ENSURE ENLIVE)  237 mL Oral BID BM  . ferrous sulfate  325 mg Oral Q breakfast  . furosemide  40 mg Oral Daily  . gabapentin  300 mg Oral QHS  . heparin  5,000 Units Subcutaneous Q8H  . hydrALAZINE  50 mg Oral 2 times per day on Sun Tue Thu Sat  . insulin aspart  0-5 Units Subcutaneous QHS  . insulin aspart  0-9 Units Subcutaneous TID WC  . latanoprost  1 drop Both Eyes QHS  . mirtazapine  15 mg Oral QHS  . multivitamin  1 tablet Oral Daily  . pravastatin  40 mg Oral QHS   . sevelamer carbonate  2,400 mg Oral TID WC  . sodium chloride flush  3 mL Intravenous Q12H   sodium chloride, acetaminophen **OR** acetaminophen, albuterol, amitriptyline, bisacodyl, docusate sodium, guaiFENesin-dextromethorphan, HYDROcodone-acetaminophen, ipratropium, ondansetron **OR** ondansetron (ZOFRAN) IV, senna-docusate, sodium chloride flush  Assessment/ Plan:  [name] is a 78 y.o. black female with end stage renal disease on hemodialysis, coronary artery disease, hypertension, diabetes mellitus type II, GERD, glaucoma, gout, who presents with bradycardia  [facility] MWF [place]. Left AVF 81.5  1. End Stage Renal Disease: continue MWF schedule. Tolerated treatment well yesterday.   2. Bradycardia: holding clonidine and metoprolol - appreciate cardiology input.  - pacemaker after plavix wash out.   3. Secondary Hyperparathyroidism: outpatient labs: 11/17/18: PTH 743, phos 4.7, calcium 8.7 - sevelamer 3 tablets with meals and 1 with snacks.   4 Anemia of chronic kidney disease:   - EPO with HD treatment   LOS: 5 Laurie Steele 1/25/202010:18 AM

## 2018-11-29 NOTE — Progress Notes (Signed)
Tele tech called to inform RN that pt had previous run of inverted P-waves. Tech saved strip in computer. Tele tech called back at 1555 to say pt had become inverted again. Pt is asymptomatic. Dr Estanislado Pandy notified via epic chat. No new orders.

## 2018-11-29 NOTE — Plan of Care (Signed)
  Problem: Clinical Measurements: Goal: Will remain free from infection Outcome: Progressing   Problem: Safety: Goal: Ability to remain free from injury will improve Outcome: Progressing   

## 2018-11-30 LAB — GLUCOSE, CAPILLARY
GLUCOSE-CAPILLARY: 141 mg/dL — AB (ref 70–99)
Glucose-Capillary: 93 mg/dL (ref 70–99)
Glucose-Capillary: 140 mg/dL — ABNORMAL HIGH (ref 70–99)
Glucose-Capillary: 188 mg/dL — ABNORMAL HIGH (ref 70–99)

## 2018-11-30 NOTE — Progress Notes (Addendum)
[place] at [place] NAME: Laurie Steele    MR#:  [id number]  DATE OF BIRTH:  1941-06-23  Patient seen and evaluated today Mild hallucinations last night Off Plavix  CHIEF COMPLAINT:   Chief Complaint  Patient presents with  . Bradycardia  . Weakness   . REVIEW OF SYSTEMS:   Review of Systems  Constitutional: Negative for chills and fever.  HENT: Negative for hearing loss.   Eyes: Negative for blurred vision, double vision and photophobia.  Respiratory: Negative for cough, hemoptysis and shortness of breath.   Cardiovascular: Negative for palpitations, orthopnea and leg swelling.  Gastrointestinal: Negative for abdominal pain, diarrhea and vomiting.  Genitourinary: Negative for dysuria and urgency.  Musculoskeletal: Negative for myalgias and neck pain.  Skin: Negative for rash.  Neurological: Negative for dizziness, focal weakness, seizures, weakness and headaches.  Psychiatric/Behavioral: Negative for memory loss. The patient does not have insomnia.    DRUG ALLERGIES:   Allergies  Allergen Reactions  . Alprazolam Other (See Comments)    Unable to sleep and confusion    VITALS:  Blood pressure (!) 164/57, pulse 66, temperature 98.6 F (37 C), temperature source Oral, resp. rate 20, height 5\' 7"  (1.702 m), weight 83.5 kg, SpO2 95 %.  PHYSICAL EXAMINATION:  GENERAL:  78 y.o.-year-old patient lying in the bed with no acute distress.  EYES: Pupils equal, round, reactive to light  No scleral icterus. Extraocular muscles intact.  HEENT: Head atraumatic, normocephalic. Oropharynx and nasopharynx clear.  NECK:  Supple, no jugular venous distention. No thyroid enlargement, no tenderness.  LUNGS: Normal breath sounds bilaterally, no wheezing, rales,rhonchi or crepitation. No use of accessory muscles of respiration.  CARDIOVASCULAR: S1, S2 regular.  No murmurs, rubs, or gallops.  ABDOMEN: Soft, nontender, nondistended. Bowel  sounds present. No organomegaly or mass.  EXTREMITIES: No pedal edema, cyanosis, or clubbing.  NEUROLOGIC: No gross neurological deficit is observed.,. PSYCHIATRIC: The patient is slightly lethargic. SKIN: No obvious rash, lesion, or ulcer.    LABORATORY PANEL:   CBC Recent Labs  Lab 11/28/18 2324  WBC 6.4  HGB 8.7*  HCT 27.7*  PLT 136*   ------------------------------------------------------------------------------------------------------------------  Chemistries  Recent Labs  Lab 11/17/2018 1643  11/28/18 2324  NA 140   < > 137  K 3.0*   < > 3.8  CL 95*   < > 99  CO2 33*   < > 31  GLUCOSE 148*   < > 107*  BUN 31*   < > 36*  CREATININE 4.46*   < > 5.23*  CALCIUM 9.2   < > 9.6  MG 2.2  --   --    < > = values in this interval not displayed.   ------------------------------------------------------------------------------------------------------------------  Cardiac Enzymes Recent Labs  Lab 12/02/2018 1643  TROPONINI 0.04*   ------------------------------------------------------------------------------------------------------------------  RADIOLOGY:  No results found.  EKG:   Orders placed or performed during the hospital encounter of 11/30/2018  . EKG 12-Lead  . EKG 12-Lead  . EKG 12-Lead  . EKG 12-Lead  . EKG 12-Lead  . EKG 12-Lead  . EKG 12-Lead  . 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  1. Third-degree AV block with bradycardia   off the beta-blockers, clonidine.  Monitor patient on telemetry Patient off Plavix for pacemaker placement tomorrow by cardiology  2.  ESRD on hemodialysis  Monday, Wednesday, Friday Appreciate nephrology follow-up  3.History of CAD, EF 45%, has history of anteroseptal hypokinesia,  Off Plavix for pacemaker placement Currently on aspirin  4.History of gout, continue  allopurinol, as needed colchicine.  5.Essential hypertension, continue Norvasc, discontinue clonidine, Toprol due to bradycardia.   6.  History of COPD: Stable, continue DuoNebs as needed.  7.Diabetes mellitus type 2, patient follows up with Dr. Elisabeth Cara, continue Humalog sliding scale with coverage.   Recent HbA1c 6.1  8.  Secondary hyperparathyroidism Continue sevelamer with meals  9.  Anemia of chronic disease Continue EPO shots with hemodialysis treatment  All the records are reviewed and case discussed with Care Management/Social Worker Management plans discussed with the patient, family and they are in agreement.  CODE STATUS: Full code  TOTAL TIME TAKING CARE OF THIS PATIENT: 34 minutes.   POSSIBLE D/C IN 1-2 DAYS, DEPENDING ON CLINICAL CONDITION.  More than 50% time spent in counseling, coordination of care  Saundra Shelling M.D on 11/30/2018 at 10:27 AM  Between 7am to 6pm - Pager - [phone]  After 6pm go to [email or web address] - password [email or web address] [facility]  Office  [phone]  CC: Primary care physician; [name], MD   Note: This dictation was prepared with Dragon dictation along with smaller phrase technology. Any transcriptional errors that result from this process are unintentional.

## 2018-11-30 NOTE — Progress Notes (Signed)
[facility]  ROUNDING NOTE   Subjective:   Heart rate in the 50s today.   Patient reports visual hallucinations and confusion overnight.   Son at bedside.   Objective:  Vital signs in last 24 hours:  Temp:  [98.6 F (37 C)-99 F (37.2 C)] 98.6 F (37 C) (01/26 0807) Pulse Rate:  [63-66] 66 (01/26 0807) Resp:  [16-20] 20 (01/26 0807) BP: (132-167)/(36-67) 164/57 (01/26 0807) SpO2:  [95 %-97 %] 95 % (01/26 0807)  Weight change:  Filed Weights   11/14/2018 1021 11/07/2018 1406 11/28/18 1200  Weight: 85 kg 83.5 kg 83.5 kg    Intake/Output: I/O last 3 completed shifts: In: 600 [P.O.:600] Out: 200 [Urine:200]   Intake/Output this shift:  Total I/O In: 240 [P.O.:240] Out: -   Physical Exam: General: NAD,   Head: Normocephalic, atraumatic. Moist oral mucosal membranes  Eyes: Anicteric, PERRL  Neck: Supple, trachea midline  Lungs:  Clear to auscultation  Heart: bradycardia  Abdomen:  Soft, nontender,   Extremities: no peripheral edema.  Neurologic: Nonfocal, moving all four extremities  Skin: No lesions  Access: Left AVF    Basic Metabolic Panel: Recent Labs  Lab 11/18/2018 1643 11/25/18 0518 11/25/18 2353 11/28/18 2324  NA 140 141 139 137  K 3.0* 4.0 4.3 3.8  CL 95* 100 97* 99  CO2 33* 32 29 31  GLUCOSE 148* 127* 147* 107*  BUN 31* 44* 64* 36*  CREATININE 4.46* 5.72* 7.21* 5.23*  CALCIUM 9.2 9.2 9.0 9.6  MG 2.2  --   --   --   PHOS  --   --  5.5* 3.2    Liver Function Tests: Recent Labs  Lab 11/25/18 2353 11/28/18 2324  ALBUMIN 3.2* 3.1*   No results for input(s): LIPASE, AMYLASE in the last 168 hours. No results for input(s): AMMONIA in the last 168 hours.  CBC: Recent Labs  Lab 11/25/2018 1643 11/25/18 2353 11/28/18 0310 11/28/18 2324  WBC 8.1 6.5 9.3 6.4  HGB 10.9* 8.8* 9.0* 8.7*  HCT 34.5* 28.4* 28.8* 27.7*  MCV 111.3* 112.3* 111.6* 113.1*  PLT 182 154 157 136*    Cardiac Enzymes: Recent Labs  Lab 11/19/2018 1643   TROPONINI 0.04*    BNP: Invalid input(s): POCBNP  CBG: Recent Labs  Lab 11/29/18 0740 11/29/18 1107 11/29/18 1624 11/29/18 2113 11/30/18 0741  GLUCAP 100* 124* 160* 166* 70    Microbiology: Results for orders placed or performed during the hospital encounter of 11/28/2018  MRSA PCR Screening     Status: None   Collection Time: 11/28/2018 10:10 AM  Result Value Ref Range Status   MRSA by PCR NEGATIVE NEGATIVE Final    Comment:        The GeneXpert MRSA Assay (FDA approved for NASAL specimens only), is one component of a comprehensive MRSA colonization surveillance program. It is not intended to diagnose MRSA infection nor to guide or monitor treatment for MRSA infections. Performed at [facility], [address] [place], [place] [address]     Coagulation Studies: No results for input(s): LABPROT, INR in the last 72 hours.  Urinalysis: No results for input(s): COLORURINE, LABSPEC, PHURINE, GLUCOSEU, HGBUR, BILIRUBINUR, KETONESUR, PROTEINUR, UROBILINOGEN, NITRITE, LEUKOCYTESUR in the last 72 hours.  Invalid input(s): APPERANCEUR    Imaging: No results found.   Medications:   . sodium chloride     . allopurinol  200 mg Oral QHS  . amLODipine  10 mg Oral Daily  . aspirin EC  325 mg Oral Daily  . epoetin (EPOGEN/PROCRIT) injection  10,000 Units Intravenous Q M,W,F-HD  . famotidine  20 mg Oral QHS  . feeding supplement (ENSURE ENLIVE)  237 mL Oral BID BM  . ferrous sulfate  325 mg Oral Q breakfast  . furosemide  40 mg Oral Daily  . gabapentin  300 mg Oral QHS  . heparin  5,000 Units Subcutaneous Q8H  . hydrALAZINE  50 mg Oral 2 times per day on Sun Tue Thu Sat  . insulin aspart  0-5 Units Subcutaneous QHS  . insulin aspart  0-9 Units Subcutaneous TID WC  . latanoprost  1 drop Both Eyes QHS  . mirtazapine  15 mg Oral QHS  . multivitamin  1 tablet Oral Daily  . pravastatin  40 mg Oral QHS  . sevelamer carbonate  2,400 mg Oral TID WC  . sodium  chloride flush  3 mL Intravenous Q12H   sodium chloride, acetaminophen **OR** acetaminophen, albuterol, amitriptyline, bisacodyl, docusate sodium, guaiFENesin-dextromethorphan, HYDROcodone-acetaminophen, ipratropium, ondansetron **OR** ondansetron (ZOFRAN) IV, senna-docusate, sodium chloride flush  Assessment/ Plan:  [name] is a 78 y.o. black female with end stage renal disease on hemodialysis, coronary artery disease, hypertension, diabetes mellitus type II, GERD, glaucoma, gout, who presents with bradycardia  [facility] MWF [place]. Left AVF 81.5  1. End Stage Renal Disease: continue MWF schedule. Orders prepared.   2. Bradycardia: holding clonidine and metoprolol - appreciate cardiology input.  - pacemaker after plavix wash out.   3. Secondary Hyperparathyroidism: outpatient labs: 11/17/18: PTH 743, phos 4.7, calcium 8.7 - sevelamer 3 tablets with meals and 1 with snacks.   4 Anemia of chronic kidney disease:   - EPO with HD treatment   LOS: 6 Ahnesty Finfrock 1/26/20209:53 AM

## 2018-12-01 ENCOUNTER — Inpatient Hospital Stay: Payer: Medicare Other

## 2018-12-01 ENCOUNTER — Inpatient Hospital Stay: Payer: Medicare Other | Admitting: Anesthesiology

## 2018-12-01 ENCOUNTER — Encounter: Payer: Self-pay | Admitting: *Deleted

## 2018-12-01 ENCOUNTER — Encounter: Admission: EM | Disposition: E | Payer: Self-pay | Source: Ambulatory Visit | Attending: Internal Medicine

## 2018-12-01 HISTORY — PX: PACEMAKER INSERTION: SHX728

## 2018-12-01 LAB — PROTIME-INR
INR: 0.93
Prothrombin Time: 12.4 seconds (ref 11.4–15.2)

## 2018-12-01 LAB — SURGICAL PCR SCREEN
MRSA, PCR: NEGATIVE
Staphylococcus aureus: NEGATIVE

## 2018-12-01 LAB — GLUCOSE, CAPILLARY
Glucose-Capillary: 124 mg/dL — ABNORMAL HIGH (ref 70–99)
Glucose-Capillary: 114 mg/dL — ABNORMAL HIGH (ref 70–99)
Glucose-Capillary: 129 mg/dL — ABNORMAL HIGH (ref 70–99)
Glucose-Capillary: 120 mg/dL — ABNORMAL HIGH (ref 70–99)
Glucose-Capillary: 110 mg/dL — ABNORMAL HIGH (ref 70–99)

## 2018-12-01 LAB — RENAL FUNCTION PANEL
ANION GAP: 13 (ref 5–15)
Albumin: 3.3 g/dL — ABNORMAL LOW (ref 3.5–5.0)
BUN: 75 mg/dL — ABNORMAL HIGH (ref 8–23)
CO2: 28 mmol/L (ref 22–32)
Calcium: 9.6 mg/dL (ref 8.9–10.3)
Chloride: 93 mmol/L — ABNORMAL LOW (ref 98–111)
Creatinine, Ser: 8.96 mg/dL — ABNORMAL HIGH (ref 0.44–1.00)
GFR calc Af Amer: 4 mL/min — ABNORMAL LOW (ref >60)
GFR calc non Af Amer: 4 mL/min — ABNORMAL LOW (ref >60)
Glucose, Bld: 125 mg/dL — ABNORMAL HIGH (ref 70–99)
POTASSIUM: 4.3 mmol/L (ref 3.5–5.1)
Phosphorus: 4.8 mg/dL — ABNORMAL HIGH (ref 2.5–4.6)
Sodium: 134 mmol/L — ABNORMAL LOW (ref 135–145)

## 2018-12-01 LAB — CBC
HEMATOCRIT: 24.9 % — AB (ref 36.0–46.0)
HEMOGLOBIN: 8 g/dL — AB (ref 12.0–15.0)
MCH: 35.1 pg — ABNORMAL HIGH (ref 26.0–34.0)
MCHC: 32.1 g/dL (ref 30.0–36.0)
MCV: 109.2 fL — ABNORMAL HIGH (ref 80.0–100.0)
Platelets: 164 10*3/uL (ref 150–400)
RBC: 2.28 MIL/uL — AB (ref 3.87–5.11)
RDW: 14.6 % (ref 11.5–15.5)
WBC: 8.2 10*3/uL (ref 4.0–10.5)
nRBC: 0 % (ref 0.0–0.2)

## 2018-12-01 SURGERY — INSERTION, CARDIAC PACEMAKER
Anesthesia: Moderate Sedation | Laterality: Right

## 2018-12-01 SURGERY — INSERTION, CARDIAC PACEMAKER
Anesthesia: General | Laterality: Right

## 2018-12-01 MED ORDER — CEFAZOLIN SODIUM-DEXTROSE 1-4 GM/50ML-% IV SOLN: 1.0000 g | Freq: Four times a day (QID) | INTRAVENOUS | Status: DC

## 2018-12-01 MED ORDER — SODIUM CHLORIDE 0.9 % IV SOLN: INTRAVENOUS | Status: DC

## 2018-12-01 MED ORDER — PROPOFOL 500 MG/50ML IV EMUL
INTRAVENOUS | Status: DC | PRN
  Administered 2018-12-01: 50 ug/kg/min via INTRAVENOUS

## 2018-12-01 MED ORDER — ONDANSETRON HCL 4 MG/2ML IJ SOLN
4.0000 mg | Freq: Four times a day (QID) | INTRAMUSCULAR | Status: DC | PRN
  Administered 2018-12-03: 4 mg via INTRAVENOUS

## 2018-12-01 MED ORDER — FENTANYL CITRATE (PF) 100 MCG/2ML IJ SOLN
25.0000 ug | INTRAMUSCULAR | Status: DC | PRN
  Administered 2018-12-01: 25 ug via INTRAVENOUS

## 2018-12-01 MED ORDER — LIDOCAINE 1 % OPTIME INJ - NO CHARGE
INTRAMUSCULAR | Status: DC | PRN
  Administered 2018-12-01: 30 mL

## 2018-12-01 MED ORDER — SEVOFLURANE IN SOLN
RESPIRATORY_TRACT | Status: AC
  Filled 2018-12-01: qty 250

## 2018-12-01 MED ORDER — CEFAZOLIN SODIUM-DEXTROSE 2-3 GM-%(50ML) IV SOLR
INTRAVENOUS | Status: DC | PRN
  Administered 2018-12-01: 2 g via INTRAVENOUS

## 2018-12-01 MED ORDER — PROPOFOL 10 MG/ML IV BOLUS
INTRAVENOUS | Status: DC | PRN
  Administered 2018-12-01: 20 mg via INTRAVENOUS

## 2018-12-01 MED ORDER — PROPOFOL 10 MG/ML IV BOLUS
INTRAVENOUS | Status: AC
  Filled 2018-12-01: qty 20

## 2018-12-01 MED ORDER — SODIUM CHLORIDE 0.9 % IV SOLN
80.0000 mg | INTRAVENOUS | Status: DC
  Filled 2018-12-01: qty 2

## 2018-12-01 MED ORDER — CEFAZOLIN SODIUM-DEXTROSE 2-4 GM/100ML-% IV SOLN: 2.0000 g | INTRAVENOUS | Status: DC

## 2018-12-01 MED ORDER — FENTANYL CITRATE (PF) 100 MCG/2ML IJ SOLN
INTRAMUSCULAR | Status: AC
  Filled 2018-12-01: qty 2

## 2018-12-01 MED ORDER — SODIUM CHLORIDE 0.9 % IV SOLN
INTRAVENOUS | Status: DC | PRN
  Administered 2018-12-01: 300 mL

## 2018-12-01 MED ORDER — GENTAMICIN SULFATE 40 MG/ML IJ SOLN
INTRAMUSCULAR | Status: AC
  Filled 2018-12-01: qty 2

## 2018-12-01 MED ORDER — CEFAZOLIN SODIUM-DEXTROSE 1-4 GM/50ML-% IV SOLN
1.0000 g | INTRAVENOUS | Status: AC
  Administered 2018-12-01: 1 g via INTRAVENOUS
  Filled 2018-12-01: qty 50

## 2018-12-01 MED ORDER — ACETAMINOPHEN 325 MG PO TABS
325.0000 mg | ORAL_TABLET | ORAL | Status: DC | PRN
  Administered 2018-12-02: 325 mg via ORAL
  Filled 2018-12-01: qty 1

## 2018-12-01 MED ORDER — CEFAZOLIN SODIUM-DEXTROSE 2-4 GM/100ML-% IV SOLN
INTRAVENOUS | Status: AC
  Filled 2018-12-01: qty 100

## 2018-12-01 MED ORDER — FENTANYL CITRATE (PF) 100 MCG/2ML IJ SOLN
INTRAMUSCULAR | Status: DC | PRN
  Administered 2018-12-01 (×2): 25 ug via INTRAVENOUS

## 2018-12-01 MED ORDER — ONDANSETRON HCL 4 MG/2ML IJ SOLN: 4.0000 mg | Freq: Once | INTRAMUSCULAR | Status: DC | PRN

## 2018-12-01 MED ORDER — FENTANYL CITRATE (PF) 100 MCG/2ML IJ SOLN
INTRAMUSCULAR | Status: AC
  Administered 2018-12-01: 16:00:00
  Filled 2018-12-01: qty 2

## 2018-12-01 SURGICAL SUPPLY — 38 items
BAG DECANTER FOR FLEXI CONT (MISCELLANEOUS) ×3 IMPLANT
BRUSH SCRUB EZ  4% CHG (MISCELLANEOUS) ×2
BRUSH SCRUB EZ 4% CHG (MISCELLANEOUS) ×1 IMPLANT
CABLE SURG 12 DISP A/V CHANNEL (MISCELLANEOUS) ×3 IMPLANT
CANISTER SUCT 1200ML W/VALVE (MISCELLANEOUS) ×3 IMPLANT
CHLORAPREP W/TINT 26ML (MISCELLANEOUS) ×3 IMPLANT
COVER LIGHT HANDLE STERIS (MISCELLANEOUS) ×6 IMPLANT
COVER MAYO STAND STRL (DRAPES) ×3 IMPLANT
COVER WAND RF STERILE (DRAPES) ×3 IMPLANT
DRAPE C-ARM XRAY 36X54 (DRAPES) ×3 IMPLANT
DRSG TEGADERM 4X4.75 (GAUZE/BANDAGES/DRESSINGS) ×3 IMPLANT
DRSG TELFA 4X3 1S NADH ST (GAUZE/BANDAGES/DRESSINGS) ×3 IMPLANT
ELECT REM PT RETURN 9FT ADLT (ELECTROSURGICAL) ×3
ELECTRODE REM PT RTRN 9FT ADLT (ELECTROSURGICAL) ×1 IMPLANT
GLOVE BIO SURGEON STRL SZ7.5 (GLOVE) ×3 IMPLANT
GLOVE BIO SURGEON STRL SZ8 (GLOVE) ×3 IMPLANT
GOWN STRL REUS W/ TWL LRG LVL3 (GOWN DISPOSABLE) ×1 IMPLANT
GOWN STRL REUS W/ TWL XL LVL3 (GOWN DISPOSABLE) ×1 IMPLANT
GOWN STRL REUS W/TWL LRG LVL3 (GOWN DISPOSABLE) ×3
GOWN STRL REUS W/TWL XL LVL3 (GOWN DISPOSABLE) ×3
IMMOBILIZER SHDR XL LX WHT (SOFTGOODS) ×3 IMPLANT
INTRO PACEMAKR LEAD 9FR 13CM (INTRODUCER) ×3
INTRO PACEMKR SHEATH II 7FR (MISCELLANEOUS) ×6
INTRODUCER PACEMKR LD 9FR 13CM (INTRODUCER) ×1 IMPLANT
INTRODUCER PACEMKR SHTH II 7FR (MISCELLANEOUS) ×2 IMPLANT
IPG PACE AZUR XT DR MRI W1DR01 (Pacemaker) ×1 IMPLANT
IV NS 500ML (IV SOLUTION) ×3
IV NS 500ML BAXH (IV SOLUTION) ×1 IMPLANT
KIT TURNOVER KIT A (KITS) ×3 IMPLANT
LABEL OR SOLS (LABEL) ×3 IMPLANT
LEAD CAPSURE NOVUS 45CM (Lead) ×3 IMPLANT
LEAD CAPSURE NOVUS 5076-52CM (Lead) ×3 IMPLANT
LEAD PASSIVE MRI 4574-53 (Lead) ×3 IMPLANT
MARKER SKIN DUAL TIP RULER LAB (MISCELLANEOUS) ×3 IMPLANT
PACE AZURE XT DR MRI W1DR01 (Pacemaker) ×3 IMPLANT
PACK PACE INSERTION (MISCELLANEOUS) ×3 IMPLANT
PAD ONESTEP ZOLL R SERIES ADT (MISCELLANEOUS) ×3 IMPLANT
SUT SILK 0 SH 30 (SUTURE) ×9 IMPLANT

## 2018-12-01 NOTE — Progress Notes (Signed)
Patient with symptomatic bradycardia, secondary to second-degree AV block with 2-1 conduction and heart rate of 40 bpm, requires permanent pacemaker implantation.

## 2018-12-01 NOTE — Plan of Care (Signed)
  Problem: Nutrition: Goal: Adequate nutrition will be maintained Outcome: Progressing Note:  NPO after midnight for pacemaker placement today   Problem: Coping: Goal: Level of anxiety will decrease Outcome: Progressing   Problem: Pain Managment: Goal: General experience of comfort will improve Outcome: Progressing Note:  No complaints of pain this shift

## 2018-12-01 NOTE — Progress Notes (Signed)
Laurie Steele at [place] NAME: Laurie Steele    MR#:  [id number]  DATE OF BIRTH:  [birth year]  Patient seen and evaluated today Has low heart rate around 40/min Off Plavix  CHIEF COMPLAINT:   Chief Complaint  Patient presents with  . Bradycardia  . Weakness   . REVIEW OF SYSTEMS:   Review of Systems  Constitutional: Negative for chills and fever.  HENT: Negative for hearing loss.   Eyes: Negative for blurred vision, double vision and photophobia.  Respiratory: Negative for cough, hemoptysis and shortness of breath.   Cardiovascular: Negative for palpitations, orthopnea and leg swelling.  Gastrointestinal: Negative for abdominal pain, diarrhea and vomiting.  Genitourinary: Negative for dysuria and urgency.  Musculoskeletal: Negative for myalgias and neck pain.  Skin: Negative for rash.  Neurological: Negative for dizziness, focal weakness, seizures, weakness and headaches.  Psychiatric/Behavioral: Negative for memory loss. The patient does not have insomnia.    DRUG ALLERGIES:   Allergies  Allergen Reactions  . Alprazolam Other (See Comments)    Unable to sleep and confusion    VITALS:  Blood pressure (!) 145/36, pulse (!) 36, temperature 98.6 F (37 C), temperature source Oral, resp. rate 16, height 5\' 7"  (1.702 m), weight 83.5 kg, SpO2 95 %.  PHYSICAL EXAMINATION:  GENERAL:  78 y.o.-year-old patient lying in the bed with no acute distress.  EYES: Pupils equal, round, reactive to light  No scleral icterus. Extraocular muscles intact.  HEENT: Head atraumatic, normocephalic. Oropharynx and nasopharynx clear.  NECK:  Supple, no jugular venous distention. No thyroid enlargement, no tenderness.  LUNGS: Normal breath sounds bilaterally, no wheezing, rales,rhonchi or crepitation. No use of accessory muscles of respiration.  CARDIOVASCULAR: S1, S2 brady cardia noted.  No murmurs, rubs, or gallops.  ABDOMEN: Soft, nontender,  nondistended. Bowel sounds present. No organomegaly or mass.  EXTREMITIES: No pedal edema, cyanosis, or clubbing.  NEUROLOGIC: No gross neurological deficit is observed.,. PSYCHIATRIC: The patient is slightly lethargic. SKIN: No obvious rash, lesion, or ulcer.    LABORATORY PANEL:   CBC Recent Labs  Lab 11/19/2018 0509  WBC 8.2  HGB 8.0*  HCT 24.9*  PLT 164   ------------------------------------------------------------------------------------------------------------------  Chemistries  Recent Labs  Lab 11/13/2018 1643  11/23/2018 0509  NA 140   < > 134*  K 3.0*   < > 4.3  CL 95*   < > 93*  CO2 33*   < > 28  GLUCOSE 148*   < > 125*  BUN 31*   < > 75*  CREATININE 4.46*   < > 8.96*  CALCIUM 9.2   < > 9.6  MG 2.2  --   --    < > = values in this interval not displayed.   ------------------------------------------------------------------------------------------------------------------  Cardiac Enzymes Recent Labs  Lab 11/12/2018 1643  TROPONINI 0.04*   ------------------------------------------------------------------------------------------------------------------  RADIOLOGY:  No results found.  EKG:   Orders placed or performed during the hospital encounter of 11/16/2018  . EKG 12-Lead  . EKG 12-Lead  . EKG 12-Lead  . EKG 12-Lead  . EKG 12-Lead  . EKG 12-Lead  . EKG 12-Lead  . 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  1. Third-degree AV block with bradycardia   off the beta-blockers, clonidine.  Monitor patient on telemetry Patient off Plavix for pacemaker placement today by cardiology Monitor  her heart rate  2.  ESRD on hemodialysis Monday, Wednesday, Friday Appreciate nephrology follow-up  3.History of CAD, EF 45%, has history of anteroseptal hypokinesia,  Off Plavix for pacemaker placement Currently on  aspirin  4.History of gout, continue allopurinol, as needed colchicine.  5.Essential hypertension, continue Norvasc, discontinue clonidine, Toprol due to bradycardia.   6.  History of COPD: Stable, continue DuoNebs as needed.  7.Diabetes mellitus type 2, patient follows up with Dr. Elisabeth Cara, continue Humalog sliding scale with coverage.   Recent HbA1c 6.1  8.  Secondary hyperparathyroidism Continue sevelamer with meals  9.  Anemia of chronic disease Continue EPO shots with hemodialysis treatment  All the records are reviewed and case discussed with Care Management/Social Worker Management plans discussed with the patient, family and they are in agreement.  CODE STATUS: Full code  TOTAL TIME TAKING CARE OF THIS PATIENT: 33 minutes.   POSSIBLE D/C IN 1-2 DAYS, DEPENDING ON CLINICAL CONDITION.  More than 50% time spent in counseling, coordination of care  Saundra Shelling M.D on 11/30/2018 at 10:04 AM  Between 7am to 6pm - Pager - [phone]  After 6pm go to [email or web address] - password [email or web address] [facility]  Office  [phone]  CC: Primary care physician; [name], MD   Note: This dictation was prepared with Dragon dictation along with smaller phrase technology. Any transcriptional errors that result from this process are unintentional.

## 2018-12-01 NOTE — Transfer of Care (Signed)
Immediate Anesthesia Transfer of Care Note  Patient: Laurie Steele  Procedure(s) Performed: INSERTION PACEMAKER (Right )  Patient Location: PACU  Anesthesia Type:MAC  Level of Consciousness: awake, alert  and patient cooperative  Airway & Oxygen Therapy: Patient Spontanous Breathing and Patient connected to nasal cannula oxygen  Post-op Assessment: Report given to RN and Post -op Vital signs reviewed and stable  Post vital signs: Reviewed and stable  Last Vitals:  Vitals Value Taken Time  BP 157/115 12/04/2018  2:00 PM  Temp    Pulse 65 12/05/2018  2:00 PM  Resp 24 11/06/2018  2:02 PM  SpO2 100 % 11/16/2018  2:00 PM  Vitals shown include unvalidated device data.  Last Pain:  Vitals:   11/18/2018 1130  TempSrc: Temporal  PainSc: 0-No pain         Complications: No apparent anesthesia complications

## 2018-12-01 NOTE — Op Note (Signed)
[facility]   11/15/2018                     1:59 PM  PATIENT:  Laurie Steele    PRE-OPERATIVE DIAGNOSIS:  n/a  POST-OPERATIVE DIAGNOSIS:  Same  PROCEDURE:  INSERTION PACEMAKER  SURGEON:  Isaias Cowman, MD    ANESTHESIA:     PREOPERATIVE INDICATIONS:  Laurie Steele is a  78 y.o. female with a diagnosis of n/a who failed conservative measures and elected for surgical management.    The risks benefits and alternatives were discussed with the patient preoperatively including but not limited to the risks of infection, bleeding, cardiopulmonary complications, the need for revision surgery, among others, and the patient was willing to proceed.   OPERATIVE PROCEDURE: The patient was brought to the operating a fasting state.  Pectoral region was prepped and draped in usual sterile manner.  Anesthesia was obtained 1% lidocaine locally.  A 6 cm vision was performed the left pectoral region.  Access was obtained to the left subclavian vein by fine-needle aspiration.  MRI compatible lead was positioned into the right ventricular apex.  A preformed passive atrial lead was positioned into the right atrial appendix.  After proper thresholds were obtained the leads were sutured in place.  The leads were connected to an MRI compatible dual-chamber rate responsive pacemaker generator ( Medtronic [id number] ).  The pacemaker pocket was irrigated gentamicin solution.  The pacemaker generator was positioned into the pocket and the pocket was closed with 2-0 and 4-0 Vicryl, respectively.  Steri-Strips and pressure dressing were applied.  Postprocedural interrogation revealed appropriate dual-chamber atrial and ventricular sensing and pacing thresholds.  There were no periprocedural complications.

## 2018-12-01 NOTE — Progress Notes (Signed)
[facility]  ROUNDING NOTE   Subjective:   Patient had permanent pacemaker placed today for second-degree heart block that was symptomatic Currently denies any shortness of breath   Objective:  Vital signs in last 24 hours:  Temp:  [96.8 F (36 C)-98.6 F (37 C)] 96.8 F (36 C) (01/27 1402) Pulse Rate:  [36-102] 60 (01/27 1515) Resp:  [9-24] 17 (01/27 1515) BP: (122-165)/(20-71) 151/52 (01/27 1515) SpO2:  [95 %-100 %] 100 % (01/27 1515)  Weight change:  Filed Weights   11/25/2018 1021 11/10/2018 1406 11/28/18 1200  Weight: 85 kg 83.5 kg 83.5 kg    Intake/Output: I/O last 3 completed shifts: In: 213 [P.O.:717; I.V.:3] Out: 150 [Urine:150]   Intake/Output this shift:  Total I/O In: 250 [I.V.:250] Out: 0   Physical Exam: General: NAD,   Head: Normocephalic, atraumatic. Moist oral mucosal membranes  Eyes: Anicteric,  Neck: Supple, trachea midline  Lungs:  Clear to auscultation, [place] O2  Heart:  Regular  Abdomen:  Soft, nontender,   Extremities: no peripheral edema.  Neurologic: Nonfocal, moving all four extremities  Skin: No lesions  Access: Left arm AVF    Basic Metabolic Panel: Recent Labs  Lab 11/29/2018 1643 11/25/18 0518 11/25/18 2353 11/28/18 2324 11/22/2018 0509  NA 140 141 139 137 134*  K 3.0* 4.0 4.3 3.8 4.3  CL 95* 100 97* 99 93*  CO2 33* 32 29 31 28   GLUCOSE 148* 127* 147* 107* 125*  BUN 31* 44* 64* 36* 75*  CREATININE 4.46* 5.72* 7.21* 5.23* 8.96*  CALCIUM 9.2 9.2 9.0 9.6 9.6  MG 2.2  --   --   --   --   PHOS  --   --  5.5* 3.2 4.8*    Liver Function Tests: Recent Labs  Lab 11/25/18 2353 11/28/18 2324 11/21/2018 0509  ALBUMIN 3.2* 3.1* 3.3*   No results for input(s): LIPASE, AMYLASE in the last 168 hours. No results for input(s): AMMONIA in the last 168 hours.  CBC: Recent Labs  Lab 11/23/2018 1643 11/25/18 2353 11/28/18 0310 11/28/18 2324 11/22/2018 0509  WBC 8.1 6.5 9.3 6.4 8.2  HGB 10.9* 8.8* 9.0* 8.7* 8.0*  HCT 34.5*  28.4* 28.8* 27.7* 24.9*  MCV 111.3* 112.3* 111.6* 113.1* 109.2*  PLT 182 154 157 136* 164    Cardiac Enzymes: Recent Labs  Lab 11/28/2018 1643  TROPONINI 0.04*    BNP: Invalid input(s): POCBNP  CBG: Recent Labs  Lab 11/30/18 1657 11/30/18 2115 11/06/2018 0805 11/27/2018 1144 11/28/2018 1409  GLUCAP 141* 188* 124* 114* 129*    Microbiology: Results for orders placed or performed during the hospital encounter of 11/23/2018  MRSA PCR Screening     Status: None   Collection Time: 11/16/2018 10:10 AM  Result Value Ref Range Status   MRSA by PCR NEGATIVE NEGATIVE Final    Comment:        The GeneXpert MRSA Assay (FDA approved for NASAL specimens only), is one component of a comprehensive MRSA colonization surveillance program. It is not intended to diagnose MRSA infection nor to guide or monitor treatment for MRSA infections. Performed at [facility], [address] [place], [place] [address]   Surgical PCR screen     Status: None   Collection Time: 11/30/2018  5:13 AM  Result Value Ref Range Status   MRSA, PCR NEGATIVE NEGATIVE Final   Staphylococcus aureus NEGATIVE NEGATIVE Final    Comment: (NOTE) The Xpert SA Assay (FDA approved for NASAL specimens in patients  13 years of age and older), is one component of a comprehensive surveillance program. It is not intended to diagnose infection nor to guide or monitor treatment. Performed at [facility], [address] [place], [place] [address]     Coagulation Studies: Recent Labs    11/14/2018 0509  LABPROT 12.4  INR 0.93    Urinalysis: No results for input(s): COLORURINE, LABSPEC, PHURINE, GLUCOSEU, HGBUR, BILIRUBINUR, KETONESUR, PROTEINUR, UROBILINOGEN, NITRITE, LEUKOCYTESUR in the last 72 hours.  Invalid input(s): APPERANCEUR    Imaging: Dg Chest Port 1 View  Result Date: 11/20/2018 CLINICAL DATA:  Status post pacemaker placement. EXAM: PORTABLE CHEST 1 VIEW COMPARISON:  Radiograph of  November 24, 2018. FINDINGS: Stable cardiomegaly. Atherosclerosis of thoracic aorta is noted. Interval placement of right-sided pacemaker with leads in grossly good position. No pneumothorax is noted. No acute pulmonary disease is noted. Bony thorax is unremarkable. IMPRESSION: Interval placement of right-sided pacemaker with leads in grossly good position. No pneumothorax is noted. Aortic Atherosclerosis (ICD10-I70.0). Electronically Signed   By: Marijo Conception, M.D.   On: 11/16/2018 14:18   Dg C-arm 1-60 Min-no Report  Result Date: 11/30/2018 Fluoroscopy was utilized by the requesting physician.  No radiographic interpretation.     Medications:   . sodium chloride 500 mL (12/04/2018 1145)  . ceFAZolin     . allopurinol  200 mg Oral QHS  . amLODipine  10 mg Oral Daily  . aspirin EC  325 mg Oral Daily  . epoetin (EPOGEN/PROCRIT) injection  10,000 Units Intravenous Q M,W,F-HD  . famotidine  20 mg Oral QHS  . feeding supplement (ENSURE ENLIVE)  237 mL Oral BID BM  . ferrous sulfate  325 mg Oral Q breakfast  . furosemide  40 mg Oral Daily  . gabapentin  300 mg Oral QHS  . heparin  5,000 Units Subcutaneous Q8H  . hydrALAZINE  50 mg Oral 2 times per day on Sun Tue Thu Sat  . insulin aspart  0-5 Units Subcutaneous QHS  . insulin aspart  0-9 Units Subcutaneous TID WC  . latanoprost  1 drop Both Eyes QHS  . mirtazapine  15 mg Oral QHS  . multivitamin  1 tablet Oral Daily  . pravastatin  40 mg Oral QHS  . sevelamer carbonate  2,400 mg Oral TID WC  . sodium chloride flush  3 mL Intravenous Q12H   sodium chloride, acetaminophen **OR** acetaminophen, albuterol, amitriptyline, bisacodyl, docusate sodium, guaiFENesin-dextromethorphan, HYDROcodone-acetaminophen, ipratropium, ondansetron **OR** ondansetron (ZOFRAN) IV, senna-docusate, sodium chloride flush  Assessment/ Plan:  Laurie Steele is a 78 y.o. black female with end stage renal disease on hemodialysis, coronary artery disease,  hypertension, diabetes mellitus type II, GERD, glaucoma, gout, who presents with bradycardia, status post permanent pacemaker December 01, 2018  [place] Left AVF   1. End Stage Renal Disease: MWF schedule. Dialysis postponed to tomorrow due to being late in the day.   2. Bradycardia: Permanent pacemaker placed today.   3. Secondary Hyperparathyroidism:  - sevelamer 3 tablets with meals and 1 with snacks.   4 Anemia of chronic kidney disease:   - EPO with HD treatment   LOS: 7 [place] Kalbfleisch 1/27/20204:06 PM

## 2018-12-01 NOTE — Care Management Important Message (Signed)
Copy of signed Medicare IM left with patient in room. 

## 2018-12-01 NOTE — Anesthesia Preprocedure Evaluation (Signed)
Anesthesia Evaluation  Patient identified by MRN, date of birth, ID band Patient awake    Reviewed: Allergy & Precautions, NPO status , Patient's Chart, lab work & pertinent test results, reviewed documented beta blocker date and time   History of Anesthesia Complications Negative for: history of anesthetic complications  Airway Mallampati: III  TM Distance: >3 FB     Dental  (+) Chipped   Pulmonary neg shortness of breath, neg sleep apnea, pneumonia, resolved, neg COPD, Recent URI , Residual Cough, former smoker,           Cardiovascular Exercise Tolerance: Poor hypertension, Pt. on medications and Pt. on home beta blockers (-) angina+ CAD, + Past MI and +CHF  (-) Cardiac Stents and (-) CABG + dysrhythmias (-) Valvular Problems/Murmurs     Neuro/Psych Anxiety  Neuromuscular disease CVA, Residual Symptoms    GI/Hepatic GERD  Controlled,  Endo/Other  diabetes, Type 2  Renal/GU ESRFRenal disease     Musculoskeletal   Abdominal   Peds  (+) ADHD Hematology  (+) Blood dyscrasia, anemia ,   Anesthesia Other Findings Past Medical History: No date: Anxiety No date: CAD (coronary artery disease) No date: Chronic anemia     Comment:  Dr Inez Pilgrim No date: Dysrhythmia No date: GERD (gastroesophageal reflux disease) No date: Glaucoma No date: Gout No date: Hx of colonic polyps No date: Hyperlipidemia No date: Hypertension No date: Lower back pain No date: Miscarriage No date: Myocardial infarction ([place]) No date: 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 ([place])     Comment:  Dialysis M-W-F No date: Neuropathy No date: NIDDM (non-insulin dependent diabetes mellitus)     Comment:  with retinopathy , and nephropathy No date: Peritoneal dialysis status ([place]) No date: Renal failure     Comment:  Dr Holley Raring No date: Renal insufficiency No date: 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 ([place])     Comment:  Dr Tobe Sos (eyes) No date:  Vaginal delivery     Comment:  x 4   Reproductive/Obstetrics                             Anesthesia Physical  Anesthesia Plan  ASA: III  Anesthesia Plan: General   Post-op Pain Management:    Induction: Intravenous  PONV Risk Score and Plan: 3 and Propofol infusion and TIVA  Airway Management Planned: Simple Face Mask, Natural Airway and Nasal Cannula  Additional Equipment:   Intra-op Plan:   Post-operative Plan:   Informed Consent: I have reviewed the patients History and Physical, chart, labs and discussed the procedure including the risks, benefits and alternatives for the proposed anesthesia with the patient or authorized representative who has indicated his/her understanding and acceptance.       Plan Discussed with: CRNA  Anesthesia Plan Comments:         Anesthesia Quick Evaluation

## 2018-12-01 NOTE — Progress Notes (Signed)
PHARMACY NOTE:  ANTIMICROBIAL RENAL DOSAGE ADJUSTMENT  Current antimicrobial regimen includes a mismatch between antimicrobial dosage and estimated renal function.  As per policy approved by the Pharmacy & Therapeutics and Medical Executive Committees, the antimicrobial dosage will be adjusted accordingly.  Current antimicrobial dosage:  Cefazolin 1g IV Q8hr x 3 doses   Indication: Post-surgical prophylaxis   Renal Function:  Estimated Creatinine Clearance: 5.8 mL/min (A) (by C-G formula based on SCr of 8.96 mg/dL (H)).   [x]      On intermittent HD, scheduled: Monday/Wednesday/Friday     Antimicrobial dosage has been changed to:  Cefazolin 1g IV Q24hr x 1 dose.   Thank you for allowing pharmacy to be a part of this patient's care.  Galen Malkowski L, [facility] 11/30/2018 4:27 PM

## 2018-12-01 NOTE — Anesthesia Post-op Follow-up Note (Signed)
Anesthesia QCDR form completed.        

## 2018-12-02 ENCOUNTER — Encounter: Payer: Self-pay | Admitting: Cardiology

## 2018-12-02 LAB — GLUCOSE, CAPILLARY
GLUCOSE-CAPILLARY: 103 mg/dL — AB (ref 70–99)
Glucose-Capillary: 76 mg/dL (ref 70–99)
Glucose-Capillary: 217 mg/dL — ABNORMAL HIGH (ref 70–99)
Glucose-Capillary: 140 mg/dL — ABNORMAL HIGH (ref 70–99)

## 2018-12-02 LAB — RENAL FUNCTION PANEL
Albumin: 3.2 g/dL — ABNORMAL LOW (ref 3.5–5.0)
Anion gap: 15 (ref 5–15)
BUN: 84 mg/dL — ABNORMAL HIGH (ref 8–23)
CO2: 26 mmol/L (ref 22–32)
Calcium: 9.3 mg/dL (ref 8.9–10.3)
Chloride: 92 mmol/L — ABNORMAL LOW (ref 98–111)
Creatinine, Ser: 10.17 mg/dL — ABNORMAL HIGH (ref 0.44–1.00)
GFR calc Af Amer: 4 mL/min — ABNORMAL LOW (ref >60)
GFR calc non Af Amer: 3 mL/min — ABNORMAL LOW (ref >60)
GLUCOSE: 98 mg/dL (ref 70–99)
PHOSPHORUS: 5.6 mg/dL — AB (ref 2.5–4.6)
Potassium: 4.9 mmol/L (ref 3.5–5.1)
Sodium: 133 mmol/L — ABNORMAL LOW (ref 135–145)

## 2018-12-02 MED ORDER — CEPHALEXIN 250 MG PO CAPS
250.0000 mg | ORAL_CAPSULE | Freq: Four times a day (QID) | ORAL | Status: DC
  Administered 2018-12-02 – 2018-12-04 (×6): 250 mg via ORAL
  Filled 2018-12-02 (×10): qty 1

## 2018-12-02 MED ORDER — CEPHALEXIN 250 MG PO CAPS: 250.0000 mg | ORAL_CAPSULE | Freq: Four times a day (QID) | ORAL | 0 refills | Status: AC

## 2018-12-02 NOTE — Progress Notes (Signed)
HD initiated via L AVF using 15g needles x2 without issue. No heparin treatment. All vitals stable. Patient alert. No current complaints. Some drainage to R Chest dressing, marked by primary RN, will monitor.

## 2018-12-02 NOTE — Progress Notes (Addendum)
[place] at [place] NAME: Laurie Steele    MR#:  [id number]  DATE OF BIRTH:  10-22-1941  Patient seen and evaluated today S/p pace maker placement yesterday Has generalized weakness  CHIEF COMPLAINT:   Chief Complaint  Patient presents with  . Bradycardia  . Weakness   . REVIEW OF SYSTEMS:   Review of Systems  Constitutional: Negative for chills and fever.  HENT: Negative for hearing loss.   Eyes: Negative for blurred vision, double vision and photophobia.  Respiratory: Negative for cough, hemoptysis and shortness of breath.   Cardiovascular: Negative for palpitations, orthopnea and leg swelling.  Gastrointestinal: Negative for abdominal pain, diarrhea and vomiting.  Genitourinary: Negative for dysuria and urgency.  Musculoskeletal: Negative for myalgias and neck pain.  Skin: Negative for rash.  Neurological: Negative for dizziness, focal weakness, seizures, weakness and headaches.  Psychiatric/Behavioral: Negative for memory loss. The patient does not have insomnia.    DRUG ALLERGIES:   Allergies  Allergen Reactions  . Alprazolam Other (See Comments)    Unable to sleep and confusion    VITALS:  Blood pressure (!) 133/44, pulse 72, temperature 98.6 F (37 C), temperature source Oral, resp. rate 16, height 5\' 7"  (1.702 m), weight 84 kg, SpO2 98 %.  PHYSICAL EXAMINATION:  GENERAL:  79 y.o.-year-old patient lying in the bed with no acute distress.  EYES: Pupils equal, round, reactive to light  No scleral icterus. Extraocular muscles intact.  HEENT: Head atraumatic, normocephalic. Oropharynx and nasopharynx clear.  NECK:  Supple, no jugular venous distention. No thyroid enlargement, no tenderness.  LUNGS: Normal breath sounds bilaterally, no wheezing, rales,rhonchi or crepitation. No use of accessory muscles of respiration.  CARDIOVASCULAR: S1, S2 regular.  No murmurs, rubs, or gallops.  ABDOMEN: Soft, nontender,  nondistended. Bowel sounds present. No organomegaly or mass.  EXTREMITIES: No pedal edema, cyanosis, or clubbing.  NEUROLOGIC: No gross neurological deficit is observed.,. PSYCHIATRIC: The patient is slightly lethargic. SKIN: No obvious rash, lesion, or ulcer.    LABORATORY PANEL:   CBC Recent Labs  Lab 11/21/2018 0509  WBC 8.2  HGB 8.0*  HCT 24.9*  PLT 164   ------------------------------------------------------------------------------------------------------------------  Chemistries  Recent Labs  Lab 12/02/18 0413  NA 133*  K 4.9  CL 92*  CO2 26  GLUCOSE 98  BUN 84*  CREATININE 10.17*  CALCIUM 9.3   ------------------------------------------------------------------------------------------------------------------  Cardiac Enzymes No results for input(s): TROPONINI in the last 168 hours. ------------------------------------------------------------------------------------------------------------------  RADIOLOGY:  Dg Chest Port 1 View  Result Date: 11/19/2018 CLINICAL DATA:  Status post pacemaker placement. EXAM: PORTABLE CHEST 1 VIEW COMPARISON:  Radiograph of November 24, 2018. FINDINGS: Stable cardiomegaly. Atherosclerosis of thoracic aorta is noted. Interval placement of right-sided pacemaker with leads in grossly good position. No pneumothorax is noted. No acute pulmonary disease is noted. Bony thorax is unremarkable. IMPRESSION: Interval placement of right-sided pacemaker with leads in grossly good position. No pneumothorax is noted. Aortic Atherosclerosis (ICD10-I70.0). Electronically Signed   By: Marijo Conception, M.D.   On: 12/05/2018 14:18   Dg C-arm 1-60 Min-no Report  Result Date: 12/04/2018 Fluoroscopy was utilized by the requesting physician.  No radiographic interpretation.    EKG:   Orders placed or performed during the hospital encounter of 11/17/2018  . EKG 12-Lead  . EKG 12-Lead  . EKG 12-Lead  . EKG 12-Lead  . EKG 12-Lead  . EKG 12-Lead  . EKG  12-Lead  . EKG 12-Lead  . EKG  12-Lead in am (before 8am)  . EKG 12-Lead in am (before 8am)    ASSESSMENT AND PLAN:   78 year old female patient with essential hypertension, ESRD on hemodialysis Monday, Wednesday, Friday brought in because of significant weakness.  1.Third-degree AV block with bradycardia resolved S/P pace maker placement  off the beta-blockers, clonidine.  Monitor patient on telemetry Patient will be restarted on plavix S/p cardiology f/u Oral keflex started  2.  ESRD on hemodialysis Monday, Wednesday, Friday Appreciate nephrology follow-up  3.History of CAD, EF 45%, has history of anteroseptal hypokinesia,  Off Plavix for pacemaker placement Currently on aspirin  4.History of gout, continue allopurinol, as needed colchicine.  5.Essential hypertension, continue Norvasc, discontinue clonidine, Toprol due to bradycardia.   6. COPD: Stable  continue DuoNebs as needed.  7.Diabetes mellitus type 2, patient follows up with Dr. Elisabeth Cara, continue Humalog sliding scale with coverage.   Recent HbA1c 6.1  8.  Secondary hyperparathyroidism Continue sevelamer with meals  9.  Anemia of chronic disease Continue EPO shots with hemodialysis treatment  10.Ambulatory Dysfunction PT evaluation  All the records are reviewed and case discussed with Care Management/Social Worker Management plans discussed with the patient, family and they are in agreement.  CODE STATUS: Full code  TOTAL TIME TAKING CARE OF THIS PATIENT: 34 minutes.   POSSIBLE D/C IN 1-2 DAYS, DEPENDING ON CLINICAL CONDITION.  More than 50% time spent in counseling, coordination of care  Saundra Shelling M.D on 12/02/2018 at 3:03 PM  Between 7am to 6pm - Pager - [phone]  After 6pm go to [email or web address] - password [email or web address] [facility]  Office  [phone]  CC: Primary care physician; [name], MD   Note: This dictation was prepared with Dragon dictation along with  smaller phrase technology. Any transcriptional errors that result from this process are unintentional.

## 2018-12-02 NOTE — Progress Notes (Signed)
HD completed without issue. UF 1.5L. Patient tolerated well. Report given to primary RN.

## 2018-12-02 NOTE — NC FL2 (Signed)
[place] LEVEL OF CARE SCREENING TOOL     IDENTIFICATION  Patient Name: Laurie Steele Birthdate: [birth year] Sex: female Admission Date (Current Location): 11/20/2018  [place] and [place] Number:  Engineering geologist and Address:  [facility], [address] [place], [place] [address]      Provider Number: [id number]  Attending Physician Name and Address:  Saundra Shelling, MD  Relative Name and Phone Number:  Saniya, [place] [phone]     Current Level of Care: Hospital Recommended Level of Care: [place] Prior Approval Number:    Date Approved/Denied:   PASRR Number: [id number]  Discharge Plan: SNF    Current Diagnoses: Patient Active Problem List   Diagnosis Date Noted  . Symptomatic bradycardia 11/05/2018  . Decreased pedal pulses 07/03/2018  . Tremors of nervous system 04/08/2018  . Altered mental status 02/28/2018  . Complication of vascular access for dialysis 07/18/2017  . UTI (urinary tract infection) 01/22/2017  . CHF (congestive heart failure) ([place]) 12/11/2016  . Ds DNA antibody positive 07/09/2016  . Pneumonia 07/03/2016  . Anemia 06/17/2016  . Symptomatic anemia 06/15/2016  . Hypoglycemia 05/21/2016  . Chest pain 04/05/2016  . Hypercalcemia 03/14/2016  . Bronchitis 03/03/2012  . Type II or unspecified type diabetes mellitus with renal manifestations, not stated as uncontrolled(250.40) 12/18/2011  . END STAGE RENAL DISEASE 10/23/2010  . DM (diabetes mellitus), type 2 ([place]) 12/13/2009  . NEUROPATHY 12/13/2009  . GOUT 02/15/2009  . SLEEP DISORDER 02/15/2009  . GERD 02/23/2008  . Hyperlipidemia 05/14/2007  . ANEMIA, CHRONIC 05/14/2007  . Essential hypertension 05/14/2007    Orientation RESPIRATION BLADDER Height & Weight     Self  Normal Continent Weight: 185 lb 3 oz (84 kg) Height:  5\' 7"  (170.2 cm)  BEHAVIORAL SYMPTOMS/MOOD NEUROLOGICAL BOWEL NUTRITION STATUS      Continent  Diet(Cardiac)  AMBULATORY STATUS COMMUNICATION OF NEEDS Skin   Limited Assist Verbally Surgical wounds                       Personal Care Assistance Level of Assistance  Bathing, Feeding, Dressing Bathing Assistance: Limited assistance Feeding assistance: Limited assistance Dressing Assistance: Limited assistance     Functional Limitations Info  Sight, Hearing, Speech Sight Info: Adequate Hearing Info: Adequate Speech Info: Adequate    SPECIAL CARE FACTORS FREQUENCY  PT (By licensed PT), OT (By licensed OT)     PT Frequency: 5x a week OT Frequency: 5x a week            Contractures Contractures Info: Not present    Additional Factors Info  Code Status, Allergies, Insulin Sliding Scale, Psychotropic Code Status Info: Full Code Allergies Info: ALPRAZOLAM Psychotropic Info: mirtazapine (REMERON) tablet 15 mg  Insulin Sliding Scale Info: insulin aspart (novoLOG) injection 0-9 Units 3x a day with meals       Current Medications (12/02/2018):  This is the current hospital active medication list Current Facility-Administered Medications  Medication Dose Route Frequency Provider Last Rate Last Dose  . 0.9 %  sodium chloride infusion  250 mL Intravenous PRN Demetrios Loll, MD 20 mL/hr at 11/17/2018 1145    . acetaminophen (TYLENOL) tablet 325-650 mg  325-650 mg Oral Q4H PRN Paraschos, Alexander, MD      . albuterol (PROVENTIL) (2.5 MG/3ML) 0.083% nebulizer solution 2.5 mg  2.5 mg Nebulization Q2H PRN Demetrios Loll, MD      . allopurinol (ZYLOPRIM) tablet 200 mg  200 mg Oral  Armanda Heritage, Sheppard Evens, MD   200 mg at 11/11/2018 2200  . amitriptyline (ELAVIL) tablet 25 mg  25 mg Oral QHS PRN Demetrios Loll, MD      . amLODipine (NORVASC) tablet 10 mg  10 mg Oral Daily Epifanio Lesches, MD   10 mg at 12/02/18 1336  . aspirin EC tablet 325 mg  325 mg Oral Daily Demetrios Loll, MD   325 mg at 12/02/18 1336  . bisacodyl (DULCOLAX) EC tablet 5 mg  5 mg Oral Daily PRN Demetrios Loll, MD      . cephALEXin  [facility]) capsule 250 mg  250 mg Oral Q6H Paraschos, Alexander, MD      . docusate sodium (COLACE) capsule 100 mg  100 mg Oral Daily PRN Demetrios Loll, MD      . epoetin alfa (EPOGEN,PROCRIT) injection 10,000 Units  10,000 Units Intravenous Q M,W,F-HD Lavonia Dana, MD   10,000 Units at 11/18/2018 1343  . famotidine (PEPCID) tablet 20 mg  20 mg Oral QHS Demetrios Loll, MD   20 mg at 12/05/2018 2159  . feeding supplement (ENSURE ENLIVE) (ENSURE ENLIVE) liquid 237 mL  237 mL Oral BID BM Demetrios Loll, MD   237 mL at 12/02/18 1337  . ferrous sulfate tablet 325 mg  325 mg Oral Q breakfast Demetrios Loll, MD   325 mg at 12/02/18 1336  . furosemide (LASIX) tablet 40 mg  40 mg Oral Daily Demetrios Loll, MD   40 mg at 12/02/18 1336  . gabapentin (NEURONTIN) capsule 300 mg  300 mg Oral QHS Epifanio Lesches, MD   300 mg at 11/07/2018 2200  . guaiFENesin-dextromethorphan (ROBITUSSIN DM) 100-10 MG/5ML syrup 5 mL  5 mL Oral Q4H PRN Saundra Shelling, MD   5 mL at 11/29/18 0455  . heparin injection 5,000 Units  5,000 Units Subcutaneous Robynn Pane, MD   5,000 Units at 12/02/18 1336  . hydrALAZINE (APRESOLINE) tablet 50 mg  50 mg Oral 2 times per day on Sun Tue Thu Sat Demetrios Loll, MD   50 mg at 11/30/18 2150  . HYDROcodone-acetaminophen (NORCO/VICODIN) 5-325 MG per tablet 1-2 tablet  1-2 tablet Oral Q4H PRN Demetrios Loll, MD   2 tablet at 12/02/18 1420  . insulin aspart (novoLOG) injection 0-5 Units  0-5 Units Subcutaneous QHS Demetrios Loll, MD      . insulin aspart (novoLOG) injection 0-9 Units  0-9 Units Subcutaneous TID WC Demetrios Loll, MD   1 Units at 11/30/18 1734  . ipratropium (ATROVENT) 0.03 % nasal spray 1 spray  1 spray Each Nare TID PRN Demetrios Loll, MD      . latanoprost (XALATAN) 0.005 % ophthalmic solution 1 drop  1 drop Both Eyes QHS Demetrios Loll, MD   1 drop at 11/25/2018 2207  . mirtazapine (REMERON) tablet 15 mg  15 mg Oral QHS Demetrios Loll, MD   15 mg at 11/30/18 2143  . multivitamin (RENA-VIT) tablet 1 tablet  1 tablet Oral  Daily Demetrios Loll, MD   1 tablet at 12/02/18 1340  . ondansetron (ZOFRAN) injection 4 mg  4 mg Intravenous Q6H PRN Demetrios Loll, MD      . ondansetron [facility]) injection 4 mg  4 mg Intravenous Q6H PRN Paraschos, Alexander, MD      . pravastatin (PRAVACHOL) tablet 40 mg  40 mg Oral QHS Demetrios Loll, MD   40 mg at 11/18/2018 2159  . senna-docusate (Senokot-S) tablet 1 tablet  1 tablet Oral QHS PRN Demetrios Loll, MD   1  tablet at 11/28/18 2159  . sevelamer carbonate (RENVELA) tablet 2,400 mg  2,400 mg Oral TID WC Kolluru, Sarath, MD   2,400 mg at 11/30/18 1734  . sodium chloride flush (NS) 0.9 % injection 3 mL  3 mL Intravenous Q12H Demetrios Loll, MD   3 mL at 12/02/18 1341  . sodium chloride flush (NS) 0.9 % injection 3 mL  3 mL Intravenous PRN Demetrios Loll, MD         Discharge Medications: Please see discharge summary for a list of discharge medications.  Relevant Imaging Results:  Relevant Lab Results:   Additional Information SSN [id number]  Ross Ludwig, [place]

## 2018-12-02 NOTE — Care Management Note (Signed)
Case Management Note  Patient Details  Name: Laurie Steele MRN: [id number] Date of Birth: May 11, 1941  Subjective/Objective:        Patient is from home with son. Admitted for pacemaker placement.  Placed on 1-27.  Daughter Lexine Baton is at bedside.  Both daughter and patient feel STR would be most beneficial.  PT evaluation pending.  Obtaining medications at [facility].  Uses a walker and a wheelchair.  Current with PCP.  Her son [name] drives her to her appointments.  Notified Randall Hiss, CSW of patients preference for STR.  Will continue to follow and assist in discharge as needed.              Action/Plan:   Expected Discharge Date:  12/02/18               Expected Discharge Plan:  [place]  In-House Referral:     Discharge planning Services  CM Consult  Post Acute Care Choice:    Choice offered to:     DME Arranged:    DME Agency:     HH Arranged:    HH Agency:     Status of Service:  In process, will continue to follow  If discussed at Long Length of Stay Meetings, dates discussed:    Additional Comments:  Elza Rafter, RN 12/02/2018, 3:06 PM

## 2018-12-02 NOTE — Anesthesia Postprocedure Evaluation (Signed)
Anesthesia Post Note  Patient: Laurie Steele  Procedure(s) Performed: INSERTION PACEMAKER (Right )  Patient location during evaluation: PACU Anesthesia Type: General Level of consciousness: awake and alert Pain management: pain level controlled Vital Signs Assessment: post-procedure vital signs reviewed and stable Respiratory status: spontaneous breathing, nonlabored ventilation, respiratory function stable and patient connected to nasal cannula oxygen Cardiovascular status: blood pressure returned to baseline and stable Postop Assessment: no apparent nausea or vomiting Anesthetic complications: no     Last Vitals:  Vitals:   12/02/18 1213 12/02/18 1217  BP: (!) 116/58 (!) 127/40  Pulse: 67 70  Resp: 12 13  Temp:  36.6 C  SpO2: 99% 100%    Last Pain:  Vitals:   12/02/18 1217  TempSrc: Oral  PainSc: 0-No pain                 Martha Clan

## 2018-12-02 NOTE — Progress Notes (Signed)
[facility] Cardiology  SUBJECTIVE: Patient laying in bed, denies chest pain or shortness of breath   Vitals:   11/18/2018 1947 12/02/18 0326 12/02/18 0407 12/02/18 0744  BP: (!) 149/50 (!) 134/48  (!) 137/98  Pulse: 63 63  77  Resp: 16 16  18   Temp: 98.3 F (36.8 C) 98.8 F (37.1 C)  98.3 F (36.8 C)  TempSrc: Oral Oral  Oral  SpO2: 100% 100%  100%  Weight:   85.6 kg   Height:         Intake/Output Summary (Last 24 hours) at 12/02/2018 0813 Last data filed at 11/13/2018 2208 Gross per 24 hour  Intake 493 ml  Output 0 ml  Net 493 ml      PHYSICAL EXAM  General: Well developed, well nourished, in no acute distress HEENT:  Normocephalic and atramatic Neck:  No JVD.  Lungs: Clear bilaterally to auscultation and percussion. Heart: HRRR . Normal S1 and S2 without gallops or murmurs.  Abdomen: Bowel sounds are positive, abdomen soft and non-tender  Msk:  Back normal, normal gait. Normal strength and tone for age. Extremities: No clubbing, cyanosis or edema.   Neuro: Alert and oriented X 3. Psych:  Good affect, responds appropriately   LABS: Basic Metabolic Panel: Recent Labs    11/21/2018 0509 12/02/18 0413  NA 134* 133*  K 4.3 4.9  CL 93* 92*  CO2 28 26  GLUCOSE 125* 98  BUN 75* 84*  CREATININE 8.96* 10.17*  CALCIUM 9.6 9.3  PHOS 4.8* 5.6*   Liver Function Tests: Recent Labs    12/05/2018 0509 12/02/18 0413  ALBUMIN 3.3* 3.2*   No results for input(s): LIPASE, AMYLASE in the last 72 hours. CBC: Recent Labs    12/05/2018 0509  WBC 8.2  HGB 8.0*  HCT 24.9*  MCV 109.2*  PLT 164   Cardiac Enzymes: No results for input(s): CKTOTAL, CKMB, CKMBINDEX, TROPONINI in the last 72 hours. BNP: Invalid input(s): POCBNP D-Dimer: No results for input(s): DDIMER in the last 72 hours. Hemoglobin A1C: No results for input(s): HGBA1C in the last 72 hours. Fasting Lipid Panel: No results for input(s): CHOL, HDL, LDLCALC, TRIG, CHOLHDL, LDLDIRECT in the last 72  hours. Thyroid Function Tests: No results for input(s): TSH, T4TOTAL, T3FREE, THYROIDAB in the last 72 hours.  Invalid input(s): FREET3 Anemia Panel: No results for input(s): VITAMINB12, FOLATE, FERRITIN, TIBC, IRON, RETICCTPCT in the last 72 hours.  Dg Chest Port 1 View  Result Date: 11/12/2018 CLINICAL DATA:  Status post pacemaker placement. EXAM: PORTABLE CHEST 1 VIEW COMPARISON:  Radiograph of November 24, 2018. FINDINGS: Stable cardiomegaly. Atherosclerosis of thoracic aorta is noted. Interval placement of right-sided pacemaker with leads in grossly good position. No pneumothorax is noted. No acute pulmonary disease is noted. Bony thorax is unremarkable. IMPRESSION: Interval placement of right-sided pacemaker with leads in grossly good position. No pneumothorax is noted. Aortic Atherosclerosis (ICD10-I70.0). Electronically Signed   By: Marijo Conception, M.D.   On: 11/18/2018 14:18   Dg C-arm 1-60 Min-no Report  Result Date: 11/19/2018 Fluoroscopy was utilized by the requesting physician.  No radiographic interpretation.     Echo LVEF 55 to 60%  TELEMETRY: Atrial sensing with ventricular pacing:  ASSESSMENT AND PLAN:  Active Problems:   Symptomatic bradycardia    1.  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  Do not lift right arm above head 2.  Keflex 250 mg 4 times daily for  7 days 3.  May discharge home from Cardiology perspective 4.  Follow-up as outpatient in 1 week with Dr. Liane Comber, MD, PhD, [facility] 12/02/2018 8:13 AM

## 2018-12-02 NOTE — Plan of Care (Signed)
Status post pacemaker, right chest, dressing clean dry & intact, no drainage over night, immobilizer in place. Pt very lethargic over night Problem: Clinical Measurements: Goal: Respiratory complications will improve Outcome: Progressing Note:  Weaned off 2L O2, back on room air   Problem: Coping: Goal: Level of anxiety will decrease Outcome: Progressing   Problem: Pain Managment: Goal: General experience of comfort will improve Outcome: Progressing Note:  No complaints of pain this shift

## 2018-12-02 NOTE — Progress Notes (Signed)
Pre hd 

## 2018-12-02 NOTE — Progress Notes (Signed)
[facility]  ROUNDING NOTE   Subjective:   Patient had permanent pacemaker  This admission for second-degree heart block that was symptomatic Currently denies any shortness of breath Patient seen during dialysis Tolerating well    HEMODIALYSIS FLOWSHEET:  Blood Flow Rate (mL/min): 400 mL/min Arterial Pressure (mmHg): -110 mmHg Venous Pressure (mmHg): 180 mmHg Transmembrane Pressure (mmHg): 60 mmHg Ultrafiltration Rate (mL/min): 490 mL/min Dialysate Flow Rate (mL/min): 600 ml/min Conductivity: Machine : 14 Conductivity: Machine : 14 Dialysis Fluid Bolus: Normal Saline Bolus Amount (mL): 100 mL Dialysate Change: 2K     Objective:  Vital signs in last 24 hours:  Temp:  [96.8 F (36 C)-98.8 F (37.1 C)] 98.2 F (36.8 C) (01/28 0838) Pulse Rate:  [39-77] 68 (01/28 1115) Resp:  [9-24] 16 (01/28 1115) BP: (84-165)/(30-98) 104/50 (01/28 1103) SpO2:  [98 %-100 %] 100 % (01/28 1115) Weight:  [85.6 kg] 85.6 kg (01/28 0838)  Weight change:  Filed Weights   11/28/18 1200 12/02/18 0407 12/02/18 0838  Weight: 83.5 kg 85.6 kg 85.6 kg    Intake/Output: I/O last 3 completed shifts: In: 46 [P.O.:240; I.V.:256] Out: 0    Intake/Output this shift:  No intake/output data recorded.  Physical Exam: General: NAD,   Head: Normocephalic, atraumatic. Moist oral mucosal membranes  Eyes: Anicteric,  Neck: Supple, trachea midline  Lungs:  Clear to auscultation, [place] O2  Heart:  Regular, paced  Abdomen:  Soft, nontender,   Extremities: no peripheral edema.  Neurologic: Nonfocal, moving all four extremities  Skin: No lesions  Access: Left arm AVF    Basic Metabolic Panel: Recent Labs  Lab 11/25/18 2353 11/28/18 2324 11/17/2018 0509 12/02/18 0413  NA 139 137 134* 133*  K 4.3 3.8 4.3 4.9  CL 97* 99 93* 92*  CO2 29 31 28 26   GLUCOSE 147* 107* 125* 98  BUN 64* 36* 75* 84*  CREATININE 7.21* 5.23* 8.96* 10.17*  CALCIUM 9.0 9.6 9.6 9.3  PHOS 5.5* 3.2 4.8* 5.6*     Liver Function Tests: Recent Labs  Lab 11/25/18 2353 11/28/18 2324 11/18/2018 0509 12/02/18 0413  ALBUMIN 3.2* 3.1* 3.3* 3.2*   No results for input(s): LIPASE, AMYLASE in the last 168 hours. No results for input(s): AMMONIA in the last 168 hours.  CBC: Recent Labs  Lab 11/25/18 2353 11/28/18 0310 11/28/18 2324 11/25/2018 0509  WBC 6.5 9.3 6.4 8.2  HGB 8.8* 9.0* 8.7* 8.0*  HCT 28.4* 28.8* 27.7* 24.9*  MCV 112.3* 111.6* 113.1* 109.2*  PLT 154 157 136* 164    Cardiac Enzymes: No results for input(s): CKTOTAL, CKMB, CKMBINDEX, TROPONINI in the last 168 hours.  BNP: Invalid input(s): POCBNP  CBG: Recent Labs  Lab 11/13/2018 1144 11/13/2018 1409 11/21/2018 1625 11/30/2018 2059 12/02/18 0745  GLUCAP 114* 129* 120* 110* 103*    Microbiology: Results for orders placed or performed during the hospital encounter of 12/04/2018  MRSA PCR Screening     Status: None   Collection Time: 11/07/2018 10:10 AM  Result Value Ref Range Status   MRSA by PCR NEGATIVE NEGATIVE Final    Comment:        The GeneXpert MRSA Assay (FDA approved for NASAL specimens only), is one component of a comprehensive MRSA colonization surveillance program. It is not intended to diagnose MRSA infection nor to guide or monitor treatment for MRSA infections. Performed at [facility], [address] [place],  [address]   Surgical PCR screen     Status: None   Collection  Time: 11/08/2018  5:13 AM  Result Value Ref Range Status   MRSA, PCR NEGATIVE NEGATIVE Final   Staphylococcus aureus NEGATIVE NEGATIVE Final    Comment: (NOTE) The Xpert SA Assay (FDA approved for NASAL specimens in patients 17 years of age and older), is one component of a comprehensive surveillance program. It is not intended to diagnose infection nor to guide or monitor treatment. Performed at [facility], [address] [place], [place] [address]     Coagulation Studies: Recent Labs     11/25/2018 0509  LABPROT 12.4  INR 0.93    Urinalysis: No results for input(s): COLORURINE, LABSPEC, PHURINE, GLUCOSEU, HGBUR, BILIRUBINUR, KETONESUR, PROTEINUR, UROBILINOGEN, NITRITE, LEUKOCYTESUR in the last 72 hours.  Invalid input(s): APPERANCEUR    Imaging: Dg Chest Port 1 View  Result Date: 11/09/2018 CLINICAL DATA:  Status post pacemaker placement. EXAM: PORTABLE CHEST 1 VIEW COMPARISON:  Radiograph of November 24, 2018. FINDINGS: Stable cardiomegaly. Atherosclerosis of thoracic aorta is noted. Interval placement of right-sided pacemaker with leads in grossly good position. No pneumothorax is noted. No acute pulmonary disease is noted. Bony thorax is unremarkable. IMPRESSION: Interval placement of right-sided pacemaker with leads in grossly good position. No pneumothorax is noted. Aortic Atherosclerosis (ICD10-I70.0). Electronically Signed   By: Marijo Conception, M.D.   On: 11/10/2018 14:18   Dg C-arm 1-60 Min-no Report  Result Date: 11/19/2018 Fluoroscopy was utilized by the requesting physician.  No radiographic interpretation.     Medications:   . sodium chloride 500 mL (12/05/2018 1145)   . allopurinol  200 mg Oral QHS  . amLODipine  10 mg Oral Daily  . aspirin EC  325 mg Oral Daily  . cephALEXin  250 mg Oral Q6H  . epoetin (EPOGEN/PROCRIT) injection  10,000 Units Intravenous Q M,W,F-HD  . famotidine  20 mg Oral QHS  . feeding supplement (ENSURE ENLIVE)  237 mL Oral BID BM  . ferrous sulfate  325 mg Oral Q breakfast  . furosemide  40 mg Oral Daily  . gabapentin  300 mg Oral QHS  . heparin  5,000 Units Subcutaneous Q8H  . hydrALAZINE  50 mg Oral 2 times per day on Sun Tue Thu Sat  . insulin aspart  0-5 Units Subcutaneous QHS  . insulin aspart  0-9 Units Subcutaneous TID WC  . latanoprost  1 drop Both Eyes QHS  . mirtazapine  15 mg Oral QHS  . multivitamin  1 tablet Oral Daily  . pravastatin  40 mg Oral QHS  . sevelamer carbonate  2,400 mg Oral TID WC  . sodium  chloride flush  3 mL Intravenous Q12H   sodium chloride, acetaminophen, albuterol, amitriptyline, bisacodyl, docusate sodium, guaiFENesin-dextromethorphan, HYDROcodone-acetaminophen, ipratropium, [DISCONTINUED] ondansetron **OR** ondansetron (ZOFRAN) IV, ondansetron (ZOFRAN) IV, senna-docusate, sodium chloride flush  Assessment/ Plan:  [name] is a 78 y.o. black female with end stage renal disease on hemodialysis, coronary artery disease, hypertension, diabetes mellitus type II, GERD, glaucoma, gout, who presents with bradycardia, status post permanent pacemaker December 01, 2018  [place] MWF [place]. Left AVF   1. End Stage Renal Disease: MWF schedule. Next HD tomorrow to get her back on routine schedules  2. Bradycardia: Permanent pacemaker placed this admission  3. Secondary Hyperparathyroidism:  - sevelamer 3 tablets with meals and 1 with snacks.   4 Anemia of chronic kidney disease:   - EPO with HD treatment   LOS: 8 Linwood Gullikson 1/28/202011:26 AM

## 2018-12-03 ENCOUNTER — Other Ambulatory Visit: Payer: Self-pay

## 2018-12-03 LAB — CBC
HCT: 25.8 % — ABNORMAL LOW (ref 36.0–46.0)
Hemoglobin: 8.2 g/dL — ABNORMAL LOW (ref 12.0–15.0)
MCH: 35.7 pg — ABNORMAL HIGH (ref 26.0–34.0)
MCHC: 31.8 g/dL (ref 30.0–36.0)
MCV: 112.2 fL — ABNORMAL HIGH (ref 80.0–100.0)
Platelets: 152 10*3/uL (ref 150–400)
RBC: 2.3 MIL/uL — ABNORMAL LOW (ref 3.87–5.11)
RDW: 14.6 % (ref 11.5–15.5)
WBC: 6.3 10*3/uL (ref 4.0–10.5)
nRBC: 0 % (ref 0.0–0.2)

## 2018-12-03 LAB — PROTIME-INR
INR: 0.99
Prothrombin Time: 13 seconds (ref 11.4–15.2)

## 2018-12-03 LAB — GLUCOSE, CAPILLARY
GLUCOSE-CAPILLARY: 202 mg/dL — AB (ref 70–99)
Glucose-Capillary: 93 mg/dL (ref 70–99)
Glucose-Capillary: 99 mg/dL (ref 70–99)
Glucose-Capillary: 163 mg/dL — ABNORMAL HIGH (ref 70–99)

## 2018-12-03 LAB — APTT: APTT: 32 s (ref 24–36)

## 2018-12-03 LAB — TROPONIN I: Troponin I: 1.37 ng/mL (ref <0.03)

## 2018-12-03 MED ORDER — NITROGLYCERIN 0.4 MG SL SUBL
0.4000 mg | SUBLINGUAL_TABLET | SUBLINGUAL | Status: DC | PRN
  Administered 2018-12-03: 0.4 mg via SUBLINGUAL
  Filled 2018-12-03: qty 1

## 2018-12-03 MED ORDER — HEPARIN BOLUS VIA INFUSION
4000.0000 [IU] | Freq: Once | INTRAVENOUS | Status: AC
  Administered 2018-12-03: 4000 [IU] via INTRAVENOUS
  Filled 2018-12-03: qty 4000

## 2018-12-03 MED ORDER — HEPARIN (PORCINE) 25000 UT/250ML-% IV SOLN
950.0000 [IU]/h | INTRAVENOUS | Status: DC
  Administered 2018-12-03: 950 [IU]/h via INTRAVENOUS
  Filled 2018-12-03: qty 250

## 2018-12-03 NOTE — Plan of Care (Signed)
  Problem: Coping: Goal: Level of anxiety will decrease Outcome: Progressing   Problem: Pain Managment: Goal: General experience of comfort will improve Outcome: Progressing Note:  Complained of pain at pacemaker site once, treated with vicodin once with relief   Problem: Safety: Goal: Ability to remain free from injury will improve Outcome: Progressing   Problem: Skin Integrity: Goal: Risk for impaired skin integrity will decrease Outcome: Progressing

## 2018-12-03 NOTE — Progress Notes (Signed)
Post HD Assessment    12/03/18 1300  Neurological  Orientation Level Oriented to person;Disoriented to place;Disoriented to time;Disoriented to situation  Respiratory  Respiratory Pattern Regular;Unlabored  Chest Assessment Chest expansion symmetrical  Bilateral Breath Sounds Clear;Diminished  Cough None  Cardiac  Pulse Regular  Heart Sounds S1, S2  ECG Monitor Yes  Cardiac Rhythm A-V Sequential paced  Antiarrhythmic device  Antiarrhythmic device Permanent Pacemaker  Vascular  R Radial Pulse +2  L Radial Pulse +2  Edema Generalized  Generalized Edema +2  Psychosocial  Psychosocial (WDL) WDL

## 2018-12-03 NOTE — Progress Notes (Signed)
PT Cancellation Note  Patient Details Name: [name] MRN: [id number] DOB: 20-Jan-1941   Cancelled Treatment:    Reason Eval/Treat Not Completed: Patient at procedure or test/unavailable.  Pt currently off unit at dialysis.  Will re-attempt PT evaluation at a later date/time.  [name], PT 12/03/18, 10:55 AM [phone]

## 2018-12-03 NOTE — Progress Notes (Signed)
Pre HD assessment    12/03/18 0855  Neurological  Level of Consciousness Alert  Orientation Level Oriented to person;Disoriented to place;Disoriented to time;Disoriented to situation  Respiratory  Respiratory Pattern Regular;Unlabored  Chest Assessment Chest expansion symmetrical  Bilateral Breath Sounds Clear;Diminished  Cough None  Cardiac  Pulse Regular  Heart Sounds S1, S2  ECG Monitor Yes  Cardiac Rhythm A-V Sequential paced  Antiarrhythmic device  Antiarrhythmic device Permanent Pacemaker  Vascular  R Radial Pulse +2  L Radial Pulse +2  Edema Generalized  Generalized Edema +2  Psychosocial  Psychosocial (WDL) WDL

## 2018-12-03 NOTE — Progress Notes (Signed)
Pt refusing blood draws at this time. Lab will attempt to collect her troponin level later.

## 2018-12-03 NOTE — Progress Notes (Signed)
Talked to Dr. Estanislado Pandy about patient's complaints of 8/10 chest pain, non radiating. Patient denies SOB or dizziness. Order to give SL nitro, stat EKG and serial troponin. Patient is on AV pace, BP 134/53. RN will continue to monitor.

## 2018-12-03 NOTE — Progress Notes (Signed)
Post HD Tx    12/03/18 1251  Hand-Off documentation  Report given to (Full Name) Particia Nearing, RN   Report received from (Full Name) Beatris Ship, RN   Vital Signs  Temp 98.1 F (36.7 C)  Temp Source Oral  Pulse Rate 65  Pulse Rate Source Monitor  Resp 13  BP (!) 111/50  BP Location Right Arm  BP Method Automatic  Patient Position (if appropriate) Lying  Oxygen Therapy  SpO2 100 %  O2 Device Room Air  Pulse Oximetry Type Continuous  Pain Assessment  Pain Scale 0-10  Pain Score 0  Dialysis Weight  Weight 79.4 kg  Type of Weight Post-Dialysis  Post-Hemodialysis Assessment  Rinseback Volume (mL) 250 mL  KECN 70.2 V  Dialyzer Clearance Lightly streaked  Duration of HD Treatment -hour(s) 3.5 hour(s)  Hemodialysis Intake (mL) 500 mL  UF Total -Machine (mL) 2040 mL  Net UF (mL) 1540 mL  Tolerated HD Treatment Yes  AVG/AVF Arterial Site Held (minutes) 10 minutes  AVG/AVF Venous Site Held (minutes) 20 minutes  Fistula / Graft Left Upper arm Arteriovenous fistula  No Placement Date or Time found.   Placed prior to admission: Yes  Orientation: Left  Access Location: Upper arm  Access Type: Arteriovenous fistula  Site Condition No complications  Fistula / Graft Assessment Present;Thrill;Bruit  Status Deaccessed

## 2018-12-03 NOTE — Clinical Social Work Note (Signed)
Patient has been faxed out for SNF placement, awaiting PT recommendations, patient will need insurance authorization.  Patient is a dialysis patient MWF [address]  Jones Broom. [place], MSW, [place]  12/03/2018 10:08 AM

## 2018-12-03 NOTE — Clinical Social Work Note (Signed)
CSW spoke to patient to discuss SNF placement she stated she was tired and asked CSW to contact her son [name] who she lives with, [phone].  CSW contacted patient's son, and he is requesting that patient goes to [facility], CSW contacted [facility] and they will review patient's information, SNF placement will be dependent on insurance approval.  CSW to continue to follow patient's progress throughout discharge planning.  Jones Broom. [place], MSW, [place]  12/03/2018 4:12 PM

## 2018-12-03 NOTE — Consult Note (Signed)
ANTICOAGULATION CONSULT NOTE - Initial Consult  Pharmacy Consult for Heparin Indication: chest pain/ACS  Allergies  Allergen Reactions  . Alprazolam Other (See Comments)    Unable to sleep and confusion    Patient Measurements: Height: 5\' 7"  (170.2 cm) Weight: 175 lb 1.6 oz (79.4 kg) IBW/kg (Calculated) : 61.6 Heparin Dosing Weight: 77.9kg  Vital Signs: Temp: 97.9 F (36.6 C) (01/29 1613) Temp Source: Oral (01/29 1613) BP: 120/61 (01/29 1836) Pulse Rate: 102 (01/29 1836)  Labs: Recent Labs    11/22/2018 0509 12/02/18 0413 12/03/18 1822  HGB 8.0*  --   --   HCT 24.9*  --   --   PLT 164  --   --   LABPROT 12.4  --   --   INR 0.93  --   --   CREATININE 8.96* 10.17*  --   TROPONINI  --   --  1.37*    Estimated Creatinine Clearance: 4.9 mL/min (A) (by C-G formula based on SCr of 10.17 mg/dL (H)).   Medical History: Past Medical History:  Diagnosis Date  . Anxiety   . CAD (coronary artery disease)   . Chronic anemia    Dr Inez Pilgrim  . Dysrhythmia   . GERD (gastroesophageal reflux disease)   . Glaucoma   . Gout   . Hx of colonic polyps   . Hyperlipidemia   . Hypertension   . Lower back pain   . Miscarriage   . Myocardial infarction ([place])   . 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 Neuropathy   . 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 Peritoneal dialysis status ([place])   . Renal failure    Dr Holley Raring  . Renal insufficiency   . 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 Vaginal delivery    x 4    Medications:  No pta anticoagulant  Assessment: Pharmacy has been consulted for heparin drip for ACS/STEMI  Goal of Therapy:  INR 2-3 Heparin level 0.3-0.7 units/ml Monitor platelets by anticoagulation protocol: Yes   Plan:  Ordered Baseline APTT, INR and CBC   Will Give Heparin Bolus 4000 units, followed by 950 units per hour  Will check HL 1/30 @ [place], PharmD, BCPS Clinical Pharmacist 12/03/2018 7:44 PM

## 2018-12-03 NOTE — Progress Notes (Signed)
HD Tx started w/o complication.    12/03/18 0915  Vital Signs  Pulse Rate 84  Pulse Rate Source Monitor  Resp 18  BP 112/71  BP Location Right Wrist  BP Method Automatic  Patient Position (if appropriate) Lying  Oxygen Therapy  SpO2 100 %  O2 Device Room Air  Pulse Oximetry Type Continuous  During Hemodialysis Assessment  Blood Flow Rate (mL/min) 400 mL/min  Arterial Pressure (mmHg) -160 mmHg  Venous Pressure (mmHg) 230 mmHg  Transmembrane Pressure (mmHg) 50 mmHg  Ultrafiltration Rate (mL/min) 660 mL/min  Dialysate Flow Rate (mL/min) 600 ml/min  Conductivity: Machine  13.9  HD Safety Checks Performed Yes  Dialysis Fluid Bolus Normal Saline  Bolus Amount (mL) 250 mL  Intra-Hemodialysis Comments Tx initiated  Fistula / Graft Left Upper arm Arteriovenous fistula  No Placement Date or Time found.   Placed prior to admission: Yes  Orientation: Left  Access Location: Upper arm  Access Type: Arteriovenous fistula  Status Accessed;Flushed  Needle Size 15 g

## 2018-12-03 NOTE — Progress Notes (Signed)
[facility] [place] NAME: Laurie Steele    MR#:  [id number]  DATE OF BIRTH:  [birth year]  Patient seen and evaluated today Has generalized weakness Patient seen in dialysis center Appears little confused  CHIEF COMPLAINT:   Chief Complaint  Patient presents with  . Bradycardia  . Weakness   . REVIEW OF SYSTEMS:   Review of Systems  Constitutional: Negative for chills and fever.  HENT: Negative for hearing loss.   Eyes: Negative for blurred vision, double vision and photophobia.  Respiratory: Negative for cough, hemoptysis and shortness of breath.   Cardiovascular: Negative for palpitations, orthopnea and leg swelling.  Gastrointestinal: Negative for abdominal pain, diarrhea and vomiting.  Genitourinary: Negative for dysuria and urgency.  Musculoskeletal: Negative for myalgias and neck pain.  Skin: Negative for rash.  Neurological: Negative for dizziness, focal weakness, seizures, weakness and headaches.  Psychiatric/Behavioral: Negative for memory loss. The patient does not have insomnia.    DRUG ALLERGIES:   Allergies  Allergen Reactions  . Alprazolam Other (See Comments)    Unable to sleep and confusion    VITALS:  Blood pressure (!) 142/64, pulse 74, temperature 98.3 F (36.8 C), temperature source Oral, resp. rate 20, height 5\' 7"  (1.702 m), weight 79.4 kg, SpO2 100 %.  PHYSICAL EXAMINATION:  GENERAL:  78 y.o.-year-old patient lying in the bed with no acute distress.  EYES: Pupils equal, round, reactive to light  No scleral icterus. Extraocular muscles intact.  HEENT: Head atraumatic, normocephalic. Oropharynx and nasopharynx clear.  NECK:  Supple, no jugular venous distention. No thyroid enlargement, no tenderness.  LUNGS: Normal breath sounds bilaterally, no wheezing, rales,rhonchi or crepitation. No use of accessory muscles of respiration.  CARDIOVASCULAR: S1, S2 regular.  No murmurs, rubs, or gallops.   ABDOMEN: Soft, nontender, nondistended. Bowel sounds present. No organomegaly or mass.  EXTREMITIES: No pedal edema, cyanosis, or clubbing.  NEUROLOGIC: No gross neurological deficit is observed.,. PSYCHIATRIC: Alert awake oriented x2 SKIN: No obvious rash, lesion, or ulcer.    LABORATORY PANEL:   CBC Recent Labs  Lab 11/30/2018 0509  WBC 8.2  HGB 8.0*  HCT 24.9*  PLT 164   ------------------------------------------------------------------------------------------------------------------  Chemistries  Recent Labs  Lab 12/02/18 0413  NA 133*  K 4.9  CL 92*  CO2 26  GLUCOSE 98  BUN 84*  CREATININE 10.17*  CALCIUM 9.3   ------------------------------------------------------------------------------------------------------------------  Cardiac Enzymes No results for input(s): TROPONINI in the last 168 hours. ------------------------------------------------------------------------------------------------------------------  RADIOLOGY:  Dg Chest Port 1 View  Result Date: 11/27/2018 CLINICAL DATA:  Status post pacemaker placement. EXAM: PORTABLE CHEST 1 VIEW COMPARISON:  Radiograph of November 24, 2018. FINDINGS: Stable cardiomegaly. Atherosclerosis of thoracic aorta is noted. Interval placement of right-sided pacemaker with leads in grossly good position. No pneumothorax is noted. No acute pulmonary disease is noted. Bony thorax is unremarkable. IMPRESSION: Interval placement of right-sided pacemaker with leads in grossly good position. No pneumothorax is noted. Aortic Atherosclerosis (ICD10-I70.0). Electronically Signed   By: Marijo Conception, M.D.   On: 11/18/2018 14:18    EKG:   Orders placed or performed during the hospital encounter of 11/05/2018  . EKG 12-Lead  . EKG 12-Lead  . EKG 12-Lead  . EKG 12-Lead  . EKG 12-Lead  . EKG 12-Lead  . EKG 12-Lead  . EKG 12-Lead  . EKG 12-Lead in am (before 8am)  . EKG 12-Lead in am (before 8am)    ASSESSMENT AND PLAN:  78 year old female patient with essential hypertension, ESRD on hemodialysis Monday, Wednesday, Friday brought in because of significant weakness.  1.Third-degree AV block with bradycardia resolved S/P pace maker placement  off the beta-blockers, clonidine.  Monitor patient on telemetry Patient will be restarted on plavix S/p cardiology f/u Oral keflex started  2.  ESRD on hemodialysis Monday, Wednesday, Friday Appreciate nephrology follow-up  3.History of CAD, EF 45%, has history of anteroseptal hypokinesia,  Off Plavix for pacemaker placement Currently on aspirin  4.History of gout, continue allopurinol, as needed colchicine.  5.Essential hypertension, continue Norvasc, discontinue clonidine, Toprol due to bradycardia.   6. COPD: Stable  continue DuoNebs as needed.  7.Diabetes mellitus type 2, patient follows up with Dr. Elisabeth Cara, continue Humalog sliding scale with coverage.   Recent HbA1c 6.1  8.  Secondary hyperparathyroidism Continue sevelamer with meals  9.  Anemia of chronic disease Continue EPO shots with hemodialysis treatment  10. Ambulatory Dysfunction and gait instability PT evaluation pending  follow-up with family to check home situation  All the records are reviewed and case discussed with Care Management/Social Worker Management plans discussed with the patient, family and they are in agreement.  CODE STATUS: Full code  TOTAL TIME TAKING CARE OF THIS PATIENT: 33 minutes.   POSSIBLE D/C IN 1-2 DAYS, DEPENDING ON CLINICAL CONDITION.  More than 50% time spent in counseling, coordination of care  Saundra Shelling M.D on 12/03/2018 at 1:59 PM  Between 7am to 6pm - Pager - [phone]  After 6pm go to [email or web address] - password [email or web address] [facility]  Office  [phone]  CC: Primary care physician; [name], MD   Note: This dictation was prepared with Dragon dictation along with smaller phrase technology. Any  transcriptional errors that result from this process are unintentional.

## 2018-12-03 NOTE — Progress Notes (Signed)
[facility]  ROUNDING NOTE   Subjective:   Patient had permanent pacemaker -placed this admission for second-degree heart block that was symptomatic Currently denies any shortness of breath Patient seen during dialysis Tolerating well  Confused and disoriented    HEMODIALYSIS FLOWSHEET:  Blood Flow Rate (mL/min): 400 mL/min Arterial Pressure (mmHg): -110 mmHg Venous Pressure (mmHg): 210 mmHg Transmembrane Pressure (mmHg): 70 mmHg Ultrafiltration Rate (mL/min): 660 mL/min Dialysate Flow Rate (mL/min): 600 ml/min Conductivity: Machine : 15.3 Conductivity: Machine : 15.3 Dialysis Fluid Bolus: Normal Saline Bolus Amount (mL): 250 mL Dialysate Change: 2K     Objective:  Vital signs in last 24 hours:  Temp:  [97.6 F (36.4 C)-98.6 F (37 C)] 98.5 F (36.9 C) (01/29 0912) Pulse Rate:  [61-84] 69 (01/29 1015) Resp:  [12-22] 17 (01/29 1015) BP: (92-137)/(34-71) 105/52 (01/29 1015) SpO2:  [96 %-100 %] 98 % (01/29 1015) Weight:  [41 kg-84 kg] 83.2 kg (01/29 0912)  Weight change: 0 kg Filed Weights   12/02/18 1213 12/03/18 0533 12/03/18 0912  Weight: 84 kg 83 kg 83.2 kg    Intake/Output: I/O last 3 completed shifts: In: 62 [P.O.:240; I.V.:6] Out: 1500 [Other:1500]   Intake/Output this shift:  No intake/output data recorded.  Physical Exam: General: NAD,   Head: Normocephalic, atraumatic. Moist oral mucosal membranes  Neck: Supple, trachea midline  Lungs:  Clear to auscultation,   Heart:  Regular, paced  Abdomen:  Soft, nontender,   Extremities: no peripheral edema.  Neurologic: disoriented and confused  Skin: No lesions  Access: Left arm AVF    Basic Metabolic Panel: Recent Labs  Lab 11/28/18 2324 11/08/2018 0509 12/02/18 0413  NA 137 134* 133*  K 3.8 4.3 4.9  CL 99 93* 92*  CO2 31 28 26   GLUCOSE 107* 125* 98  BUN 36* 75* 84*  CREATININE 5.23* 8.96* 10.17*  CALCIUM 9.6 9.6 9.3  PHOS 3.2 4.8* 5.6*    Liver Function Tests: Recent  Labs  Lab 11/28/18 2324 11/13/2018 0509 12/02/18 0413  ALBUMIN 3.1* 3.3* 3.2*   No results for input(s): LIPASE, AMYLASE in the last 168 hours. No results for input(s): AMMONIA in the last 168 hours.  CBC: Recent Labs  Lab 11/28/18 0310 11/28/18 2324 11/18/2018 0509  WBC 9.3 6.4 8.2  HGB 9.0* 8.7* 8.0*  HCT 28.8* 27.7* 24.9*  MCV 111.6* 113.1* 109.2*  PLT 157 136* 164    Cardiac Enzymes: No results for input(s): CKTOTAL, CKMB, CKMBINDEX, TROPONINI in the last 168 hours.  BNP: Invalid input(s): POCBNP  CBG: Recent Labs  Lab 12/02/18 0745 12/02/18 1314 12/02/18 1653 12/02/18 2113 12/03/18 0742  GLUCAP 103* 76 217* 140* 93    Microbiology: Results for orders placed or performed during the hospital encounter of 12/05/2018  MRSA PCR Screening     Status: None   Collection Time: 11/27/2018 10:10 AM  Result Value Ref Range Status   MRSA by PCR NEGATIVE NEGATIVE Final    Comment:        The GeneXpert MRSA Assay (FDA approved for NASAL specimens only), is one component of a comprehensive MRSA colonization surveillance program. It is not intended to diagnose MRSA infection nor to guide or monitor treatment for MRSA infections. Performed at [facility], [address] [place], [place] [address]   Surgical PCR screen     Status: None   Collection Time: 11/25/2018  5:13 AM  Result Value Ref Range Status   MRSA, PCR NEGATIVE NEGATIVE Final   Staphylococcus  aureus NEGATIVE NEGATIVE Final    Comment: (NOTE) The Xpert SA Assay (FDA approved for NASAL specimens in patients 36 years of age and older), is one component of a comprehensive surveillance program. It is not intended to diagnose infection nor to guide or monitor treatment. Performed at [facility], [address] [place], [place] [address]     Coagulation Studies: Recent Labs    12/02/2018 0509  LABPROT 12.4  INR 0.93    Urinalysis: No results for input(s): COLORURINE, LABSPEC,  PHURINE, GLUCOSEU, HGBUR, BILIRUBINUR, KETONESUR, PROTEINUR, UROBILINOGEN, NITRITE, LEUKOCYTESUR in the last 72 hours.  Invalid input(s): APPERANCEUR    Imaging: Dg Chest Port 1 View  Result Date: 11/14/2018 CLINICAL DATA:  Status post pacemaker placement. EXAM: PORTABLE CHEST 1 VIEW COMPARISON:  Radiograph of November 24, 2018. FINDINGS: Stable cardiomegaly. Atherosclerosis of thoracic aorta is noted. Interval placement of right-sided pacemaker with leads in grossly good position. No pneumothorax is noted. No acute pulmonary disease is noted. Bony thorax is unremarkable. IMPRESSION: Interval placement of right-sided pacemaker with leads in grossly good position. No pneumothorax is noted. Aortic Atherosclerosis (ICD10-I70.0). Electronically Signed   By: Marijo Conception, M.D.   On: 11/11/2018 14:18   Dg C-arm 1-60 Min-no Report  Result Date: 11/27/2018 Fluoroscopy was utilized by the requesting physician.  No radiographic interpretation.     Medications:   . sodium chloride 500 mL (11/30/2018 1145)   . allopurinol  200 mg Oral QHS  . amLODipine  10 mg Oral Daily  . aspirin EC  325 mg Oral Daily  . cephALEXin  250 mg Oral Q6H  . epoetin (EPOGEN/PROCRIT) injection  10,000 Units Intravenous Q M,W,F-HD  . famotidine  20 mg Oral QHS  . feeding supplement (ENSURE ENLIVE)  237 mL Oral BID BM  . ferrous sulfate  325 mg Oral Q breakfast  . furosemide  40 mg Oral Daily  . gabapentin  300 mg Oral QHS  . heparin  5,000 Units Subcutaneous Q8H  . hydrALAZINE  50 mg Oral 2 times per day on Sun Tue Thu Sat  . insulin aspart  0-5 Units Subcutaneous QHS  . insulin aspart  0-9 Units Subcutaneous TID WC  . latanoprost  1 drop Both Eyes QHS  . mirtazapine  15 mg Oral QHS  . multivitamin  1 tablet Oral Daily  . pravastatin  40 mg Oral QHS  . sevelamer carbonate  2,400 mg Oral TID WC  . sodium chloride flush  3 mL Intravenous Q12H   sodium chloride, acetaminophen, albuterol, amitriptyline, bisacodyl,  docusate sodium, guaiFENesin-dextromethorphan, HYDROcodone-acetaminophen, ipratropium, [DISCONTINUED] ondansetron **OR** ondansetron (ZOFRAN) IV, ondansetron (ZOFRAN) IV, senna-docusate, sodium chloride flush  Assessment/ Plan:  [name] is a 78 y.o. black female with end stage renal disease on hemodialysis, coronary artery disease, hypertension, diabetes mellitus type II, GERD, glaucoma, gout, who presents with bradycardia, status post permanent pacemaker December 01, 2018  [facility] MWF [facility]. Left AVF   1. End Stage Renal Disease: MWF schedule. Patient seen during dialysis Tolerating well  2. Bradycardia: Permanent pacemaker placed this admission  3. Secondary Hyperparathyroidism:  - sevelamer 3 tablets with meals and 1 with snacks.   4 Anemia of chronic kidney disease:   - EPO with HD treatment  5. Confusion -  Patient appears to be not at baseline today   LOS: 9 Caryssa Elzey 1/29/202010:43 AM

## 2018-12-03 NOTE — Evaluation (Signed)
Physical Therapy Evaluation Patient Details Name: Laurie Steele MRN: [id number] DOB: [birth year] Today's Date: 12/03/2018   History of Present Illness  Pt is a 78 y.o. female presenting to hospital 11/12/2018 with HR in 30's at HD (HR 30's to 50's in ED).  Pt admitted with symptomatic bradycardia d/t 2nd or 3rd degree AV block.  Pt s/p pacemaker insertion 12/03/2018.  PMH includes anxiety, CAD, gout, htn, LBP, MI, neuropathy, ESRD, hemiparesis with R sided weakness, NIDDM, h/o back surgery.  Clinical Impression  Prior to hospital admission, pt was able to perform transfers and household ambulation with RW with minimal assistance.  Pt lives with family in 1 level house with 1 step to enter (use w/c to enter/exit home); goes to dialysis.  Currently pt is 1-2 assist with bed mobility and mod to max assist x1 to stand.  Pt requiring vc's and tactile cues to maintain R UE pacemaker precautions.  Pt unable to take any steps with 1 assist and L UE support so pt assisted back to bed.  Pt appearing with generalized weakness and decreased activity tolerance s/p dialysis today and from extended hospitalization.  Pt would benefit from skilled PT to address noted impairments and functional limitations (see below for any additional details).  Upon hospital discharge, recommend pt discharge to [facility].    Follow Up Recommendations SNF    Equipment Recommendations  Rolling walker with 5" wheels    Recommendations for Other Services OT consult     Precautions / Restrictions Precautions Precautions: Fall;Other (comment) Precaution Comments: Pacemaker precautions (R side); L UE AV fistula Restrictions Weight Bearing Restrictions: Yes Other Position/Activity Restrictions: R UE pacemaker precautions      Mobility  Bed Mobility Overal bed mobility: Needs Assistance Bed Mobility: Supine to Sit;Sit to Supine     Supine to sit: Mod assist;HOB elevated Sit to supine: Min assist;Mod assist;+2 for physical  assistance;HOB elevated   General bed mobility comments: assist for trunk and LE's semi-supine to sit; 2 assist for safety sit to supine and to boost up in bed end of session  Transfers Overall transfer level: Needs assistance Equipment used: None;Rolling walker (2 wheeled) Transfers: Sit to/from Stand Sit to Stand: Mod assist;Max assist         General transfer comment: mod assist to stand from bed (L UE support on bed rail); pt unable to take any steps with L UE support on bed rail so pt assisted back to sitting on edge of bed; max assist to stand up to walker; vc's and tactile cues required for positioning/technique  Ambulation/Gait             General Gait Details: pt unable to ambulate with single UE support on bedrail or with RW use (and 1 person assist)  Stairs            Wheelchair Mobility    Modified Rankin (Stroke Patients Only)       Balance Overall balance assessment: Needs assistance Sitting-balance support: No upper extremity supported;Feet supported Sitting balance-Leahy Scale: Fair Sitting balance - Comments: steady static sitting   Standing balance support: Single extremity supported Standing balance-Leahy Scale: Poor Standing balance comment: pt requiring at least single UE support for static standing balance                             Pertinent Vitals/Pain Pain Assessment: No/denies pain  Vitals (HR and O2 on room air) stable and WFL throughout treatment  session.    Home Living Family/patient expects to be discharged to:: Private residence Living Arrangements: Children(Pt's son) Available Help at Discharge: Family;Available 24 hours/day Type of Home: House Home Access: Stairs to enter Entrance Stairs-Rails: None Entrance Stairs-Number of Steps: 1 Home Layout: One level Home Equipment: Bedside commode;Walker - 2 wheels      Prior Function Level of Independence: Needs assistance   Gait / Transfers Assistance Needed:  Minimal assistance to stand and walk household distances with walker           Hand Dominance        Extremity/Trunk Assessment   Upper Extremity Assessment Upper Extremity Assessment: (Good B hand grip strength; deferred rest of R UE (s/p pacemaker); L UE WFL)    Lower Extremity Assessment Lower Extremity Assessment: RLE deficits/detail;LLE deficits/detail RLE Deficits / Details: hip flexion 3-/5; knee flexion/extension 3-/5; DF 3-/5 LLE Deficits / Details: hip flexion 4/5; knee flexion/extension 4/5; DF 4/5    Cervical / Trunk Assessment Cervical / Trunk Assessment: Normal  Communication   Communication: No difficulties  Cognition Arousal/Alertness: Awake/alert Behavior During Therapy: WFL for tasks assessed/performed Overall Cognitive Status: Impaired/Different from baseline Area of Impairment: Orientation                               General Comments: Oriented to person, time, and situation; pt reported she was in her grandson's bedroom      General Comments General comments (skin integrity, edema, etc.): drainage noted R pacemaker dressing beginning of session (no change noted at end of session).  Nursing cleared pt for participation in physical therapy.  Pt agreeable to PT session.  Pt's son present beginning and end of session (left during session).    Exercises  Transfer training   Assessment/Plan    PT Assessment Patient needs continued PT services  PT Problem List Decreased strength;Decreased activity tolerance;Decreased balance;Decreased mobility;Decreased cognition;Decreased knowledge of use of DME;Decreased knowledge of precautions;Pain;Decreased skin integrity       PT Treatment Interventions DME instruction;Gait training;Functional mobility training;Therapeutic activities;Therapeutic exercise;Balance training;Patient/family education    PT Goals (Current goals can be found in the Care Plan section)  Acute Rehab PT Goals Patient Stated  Goal: to improve strength and walking PT Goal Formulation: With patient/family Time For Goal Achievement: 12/17/18 Potential to Achieve Goals: Fair    Frequency Min 2X/week   Barriers to discharge Decreased caregiver support      Co-evaluation               AM-PAC PT "6 Clicks" Mobility  Outcome Measure Help needed turning from your back to your side while in a flat bed without using bedrails?: A Lot Help needed moving from lying on your back to sitting on the side of a flat bed without using bedrails?: A Lot Help needed moving to and from a bed to a chair (including a wheelchair)?: Total Help needed standing up from a chair using your arms (e.g., wheelchair or bedside chair)?: Total Help needed to walk in hospital room?: Total Help needed climbing 3-5 steps with a railing? : Total 6 Click Score: 8    End of Session Equipment Utilized During Treatment: Gait belt Activity Tolerance: Patient limited by fatigue Patient left: in bed;with call bell/phone within reach;with bed alarm set Nurse Communication: Mobility status;Precautions PT Visit Diagnosis: Other abnormalities of gait and mobility (R26.89);Muscle weakness (generalized) (M62.81);Difficulty in walking, not elsewhere classified (R26.2)    Time: 4944-9675  PT Time Calculation (min) (ACUTE ONLY): 25 min   Charges:   PT Evaluation $PT Eval Low Complexity: 1 Low PT Treatments $Therapeutic Activity: 8-22 mins       [name], PT 12/03/18, 4:05 PM [phone]

## 2018-12-03 NOTE — Plan of Care (Signed)
  Problem: Education: Goal: Knowledge of General Education information will improve Description Including pain rating scale, medication(s)/side effects and non-pharmacologic comfort measures Outcome: Progressing   Problem: Health Behavior/Discharge Planning: Goal: Ability to manage health-related needs will improve Outcome: Progressing   Problem: Clinical Measurements: Goal: Ability to maintain clinical measurements within normal limits will improve Outcome: Progressing Goal: Will remain free from infection Outcome: Progressing Note:  Remains afebrile Goal: Cardiovascular complication will be avoided Outcome: Progressing Note:  No arrhythmias over night, remains in a paced rhythm   Problem: Education: Goal: Knowledge of cardiac device and self-care will improve Outcome: Progressing Goal: Ability to safely manage health related needs after discharge will improve Outcome: Progressing   Problem: Cardiac: Goal: Ability to achieve and maintain adequate cardiopulmonary perfusion will improve Outcome: Progressing   Problem: Clinical Measurements: Goal: Diagnostic test results will improve Outcome: Not Progressing Note:  BUN/Crea this am 84/10.17

## 2018-12-03 NOTE — Progress Notes (Signed)
HD Tx completed, tolerated well, uf goal met.    12/03/18 1245  Vital Signs  Pulse Rate 61  Pulse Rate Source Monitor  Resp 20  BP (!) 113/48  BP Location Right Arm  BP Method Automatic  Patient Position (if appropriate) Lying  Oxygen Therapy  O2 Device Room Air  Pulse Oximetry Type Continuous  During Hemodialysis Assessment  HD Safety Checks Performed Yes  KECN 70.2 KECN  Dialysis Fluid Bolus Normal Saline  Bolus Amount (mL) 250 mL  Intra-Hemodialysis Comments Tx completed;Tolerated well

## 2018-12-03 NOTE — Progress Notes (Signed)
Pt had chest pain this evening, responded to Nitro tab. EKG- paced rhythm  Troponin in > 1 ( new rise)  Currently pt is pain free per nurse after gettign nitro tab.  I spoke to Dr. Ubaldo Glassing and he suggest to start on heparin drip for now. Cardiology will folllow tomorrow.

## 2018-12-03 NOTE — Progress Notes (Signed)
Pre HD Tx    12/03/18 0912  Vital Signs  Temp 98.5 F (36.9 C)  Temp Source Oral  Pulse Rate 78  Pulse Rate Source Monitor  Resp 16  BP (!) 93/52  BP Location Right Wrist  BP Method Automatic  Patient Position (if appropriate) Lying  Oxygen Therapy  SpO2 100 %  O2 Device Room Air  Pulse Oximetry Type Continuous  Pain Assessment  Pain Scale 0-10  Pain Score 0  Dialysis Weight  Weight 83.2 kg  Type of Weight Pre-Dialysis  Time-Out for Hemodialysis  What Procedure? HD  Pt Identifiers(min of two) First/Last Name;MRN/Account#  Correct Site? Yes  Correct Side? Yes  Correct Procedure? Yes  Consents Verified? Yes  Rad Studies Available? N/A  Safety Precautions Reviewed? Yes  Engineer, civil (consulting) Number 5  Station Number 3  UF/Alarm Test Passed  Conductivity: Meter 14  Conductivity: Machine  13.9  pH 7.4  Reverse Osmosis Main  Normal Saline Lot Number [id number]  Dialyzer Lot Number [id number]  Disposable Set Lot Number [id number]  Machine Temperature 98.6 F (37 C)  Musician and Audible Yes  Blood Lines Intact and Secured Yes  Pre Treatment Patient Checks  Vascular access used during treatment Fistula  Hepatitis B Surface Antigen Results Negative  Date Hepatitis B Surface Antigen Drawn 08/18/18  Hepatitis B Surface Antibody  (>10)  Date Hepatitis B Surface Antibody Drawn 08/18/18  Hemodialysis Consent Verified Yes  Hemodialysis Standing Orders Initiated Yes  ECG (Telemetry) Monitor On Yes  Prime Ordered Normal Saline  Length of  DialysisTreatment -hour(s) 3.5 Hour(s)  Dialysis Treatment Comments Na 140  Dialyzer Elisio 17H NR  Dialysate 2K, 2.5 Ca  Dialysis Anticoagulant None  Dialysate Flow Ordered 600  Blood Flow Rate Ordered 400 mL/min  Ultrafiltration Goal 1.5 Liters  Dialysis Blood Pressure Support Ordered Normal Saline  Education / Care Plan  Dialysis Education Provided Yes (Pt educated on Safety during dialysis, B/P )  Documented Education in  Care Plan Yes  Fistula / Graft Left Upper arm Arteriovenous fistula  No Placement Date or Time found.   Placed prior to admission: Yes  Orientation: Left  Access Location: Upper arm  Access Type: Arteriovenous fistula  Site Condition No complications  Fistula / Graft Assessment Present;Thrill;Bruit  Status Patent  Drainage Description None

## 2018-12-03 NOTE — Progress Notes (Signed)
CRITICAL VALUE ALERT  Critical Value:  Troponin 1.37  Date & Time Notied:  12/03/2018, 1932  Provider Notified: Dr. Anselm Jungling   Orders Received/Actions taken: Patient had chest pain and nausea, given nitro which help her chest pain and given zofran for nausea. Patient states no chest pain now. Per MD will start heparin drip. Waiting for pharmacy to evaluate. RN will continue to monitor.

## 2018-12-03 NOTE — Progress Notes (Signed)
To Dialysis via bed 

## 2018-12-03 NOTE — Progress Notes (Signed)
Pre HD assessment, pt is calm cooperative, ok to start Tx.    12/03/18 5643  Neurological  Level of Consciousness Alert  Orientation Level Oriented to person;Disoriented to place;Disoriented to time;Disoriented to situation  Respiratory  Respiratory Pattern Regular;Unlabored  Chest Assessment Chest expansion symmetrical  Bilateral Breath Sounds Clear;Diminished  Cough None  Cardiac  Pulse Regular  Heart Sounds S1, S2  ECG Monitor Yes  Cardiac Rhythm A-V Sequential paced  Antiarrhythmic device  Antiarrhythmic device Permanent Pacemaker  Vascular  R Radial Pulse +2  L Radial Pulse +2  Edema Generalized  Generalized Edema +2  Psychosocial  Psychosocial (WDL) WDL

## 2018-12-04 ENCOUNTER — Inpatient Hospital Stay: Payer: Medicare Other

## 2018-12-04 LAB — URINALYSIS, COMPLETE (UACMP) WITH MICROSCOPIC
BACTERIA UA: NONE SEEN
Bilirubin Urine: NEGATIVE
Glucose, UA: 50 mg/dL — AB
Ketones, ur: NEGATIVE mg/dL
Nitrite: NEGATIVE
Protein, ur: 100 mg/dL — AB
Specific Gravity, Urine: 1.011 (ref 1.005–1.030)
pH: 8 (ref 5.0–8.0)

## 2018-12-04 LAB — GLUCOSE, CAPILLARY
Glucose-Capillary: 123 mg/dL — ABNORMAL HIGH (ref 70–99)
Glucose-Capillary: 167 mg/dL — ABNORMAL HIGH (ref 70–99)
Glucose-Capillary: 122 mg/dL — ABNORMAL HIGH (ref 70–99)
Glucose-Capillary: 143 mg/dL — ABNORMAL HIGH (ref 70–99)

## 2018-12-04 LAB — CBC
HCT: 26.1 % — ABNORMAL LOW (ref 36.0–46.0)
HEMOGLOBIN: 8.2 g/dL — AB (ref 12.0–15.0)
MCH: 35.8 pg — ABNORMAL HIGH (ref 26.0–34.0)
MCHC: 31.4 g/dL (ref 30.0–36.0)
MCV: 114 fL — ABNORMAL HIGH (ref 80.0–100.0)
Platelets: 169 10*3/uL (ref 150–400)
RBC: 2.29 MIL/uL — ABNORMAL LOW (ref 3.87–5.11)
RDW: 14.5 % (ref 11.5–15.5)
WBC: 9.4 10*3/uL (ref 4.0–10.5)
nRBC: 0 % (ref 0.0–0.2)

## 2018-12-04 LAB — TROPONIN I
Troponin I: 1.32 ng/mL (ref <0.03)
Troponin I: 4.55 ng/mL (ref <0.03)

## 2018-12-04 LAB — HEPARIN LEVEL (UNFRACTIONATED)
Heparin Unfractionated: 0.37 IU/mL (ref 0.30–0.70)
Heparin Unfractionated: 0.28 IU/mL — ABNORMAL LOW (ref 0.30–0.70)

## 2018-12-04 MED ORDER — VITAMIN C 500 MG PO TABS
500.0000 mg | ORAL_TABLET | Freq: Two times a day (BID) | ORAL | Status: DC
  Filled 2018-12-04 (×2): qty 1

## 2018-12-04 MED ORDER — SODIUM CHLORIDE 0.9 % IV SOLN
1.0000 g | INTRAVENOUS | Status: DC
  Administered 2018-12-04 – 2018-12-05 (×2): 1 g via INTRAVENOUS
  Filled 2018-12-04 (×3): qty 1

## 2018-12-04 MED ORDER — ADULT MULTIVITAMIN W/MINERALS CH
1.0000 | ORAL_TABLET | Freq: Every day | ORAL | Status: DC
  Filled 2018-12-04 (×2): qty 1

## 2018-12-04 MED ORDER — CLOPIDOGREL BISULFATE 75 MG PO TABS
75.0000 mg | ORAL_TABLET | Freq: Every day | ORAL | Status: DC
  Administered 2018-12-04: 75 mg via ORAL
  Filled 2018-12-04 (×2): qty 1

## 2018-12-04 MED ORDER — HEPARIN SODIUM (PORCINE) 5000 UNIT/ML IJ SOLN
5000.0000 [IU] | Freq: Three times a day (TID) | INTRAMUSCULAR | Status: DC
  Administered 2018-12-04 – 2018-12-06 (×6): 5000 [IU] via SUBCUTANEOUS
  Filled 2018-12-04 (×6): qty 1

## 2018-12-04 MED ORDER — GUAIFENESIN-DM 100-10 MG/5ML PO SYRP
5.0000 mL | ORAL_SOLUTION | ORAL | Status: DC | PRN
  Filled 2018-12-04: qty 5

## 2018-12-04 NOTE — Progress Notes (Signed)
[facility] Cardiology  SUBJECTIVE: Mr. Laurie Steele is a 78 year old female who was admitted for symptomatic bradycardia.  Underwent dual chamber pacemaker implantation with Dr. Saralyn Pilar on 11/19/2018.  Complained of chest pain last night, 12/03/18 and was given sublingual nitroglycerin tablet with relief of symptoms.  Started on heparin drip.   Today, Laurie Steele is disoriented, unable to provide any history.    Vitals:   12/03/18 1836 12/03/18 2018 12/04/18 0344 12/04/18 0347  BP: 120/61 (!) 131/48  (!) 121/58  Pulse: (!) 102 73  92  Resp:  18    Temp:  98.4 F (36.9 C)  98.4 F (36.9 C)  TempSrc:  Oral  Oral  SpO2:  93%  99%  Weight:   79.7 kg   Height:         Intake/Output Summary (Last 24 hours) at 12/04/2018 1610 Last data filed at 12/04/2018 0350 Gross per 24 hour  Intake -  Output 1540 ml  Net -1540 ml      PHYSICAL EXAM  General: Well developed, well nourished, in no acute distress HEENT:  Normocephalic and atramatic Neck:  No JVD.  Lungs: Clear bilaterally to auscultation and percussion. Heart: HRRR . Normal S1 and S2 without gallops or murmurs.  Abdomen: Bowel sounds are positive, abdomen soft and non-tender  Msk:  Back normal, normal gait. Normal strength and tone for age. Extremities: No clubbing, cyanosis or edema.   Neuro: Alert and oriented X 3. Psych:  Good affect, responds appropriately   LABS: Basic Metabolic Panel: Recent Labs    12/02/18 0413  NA 133*  K 4.9  CL 92*  CO2 26  GLUCOSE 98  BUN 84*  CREATININE 10.17*  CALCIUM 9.3  PHOS 5.6*   Liver Function Tests: Recent Labs    12/02/18 0413  ALBUMIN 3.2*   No results for input(s): LIPASE, AMYLASE in the last 72 hours. CBC: Recent Labs    12/03/18 2010 12/04/18 0412  WBC 6.3 9.4  HGB 8.2* 8.2*  HCT 25.8* 26.1*  MCV 112.2* 114.0*  PLT 152 169   Cardiac Enzymes: Recent Labs    12/03/18 1822 12/04/18 0412  TROPONINI 1.37* 1.32*   BNP: Invalid input(s): POCBNP D-Dimer: No results for  input(s): DDIMER in the last 72 hours. Hemoglobin A1C: No results for input(s): HGBA1C in the last 72 hours. Fasting Lipid Panel: No results for input(s): CHOL, HDL, LDLCALC, TRIG, CHOLHDL, LDLDIRECT in the last 72 hours. Thyroid Function Tests: No results for input(s): TSH, T4TOTAL, T3FREE, THYROIDAB in the last 72 hours.  Invalid input(s): FREET3 Anemia Panel: No results for input(s): VITAMINB12, FOLATE, FERRITIN, TIBC, IRON, RETICCTPCT in the last 72 hours.  No results found.   Echo:  Normal LV function with an EF estimated between 55 and 60% with moderate LVH, trivial AI, mild MR.   TELEMETRY: Paced rhythm   ASSESSMENT AND PLAN:  Active Problems:   Symptomatic bradycardia   1.  Chest pain   -Patient delirious, unable to answer questioning   -Will trend third troponin and remain on heparin drip for now   -Not candidate for cardiac cath at present. Troponin is flat. 2.  Symptomatic bradycardia s/p pacemaker insertion  -No evidence of bleeding, drainage from incision site   -Will set up pacemaker clinic in an outpatient setting   The history, physical exam findings, and plan of care were all discussed with Dr. Bartholome Bill, and all decision making was made in collaboration.     Avie Arenas  PA-C 12/04/2018 8:08 AM

## 2018-12-04 NOTE — Clinical Social Work Note (Signed)
Clinical Social Work Assessment  Patient Details  Name: Laurie Steele MRN: [id number] Date of Birth: 1941/07/20  Date of referral:  12/03/18               Reason for consult:  Facility Placement                Permission sought to share information with:  Family Supports, Customer service manager Permission granted to share information::  Yes, Verbal Permission Granted  Name::     [place], [place] [phone]   Agency::  SNF Admissions  Relationship::     Contact Information:     Housing/Transportation Living arrangements for the past 2 months:  Single Family Home Source of Information:  Patient, Adult Children Patient Interpreter Needed:  None Criminal Activity/Legal Involvement Pertinent to Current Situation/Hospitalization:    Significant Relationships:  Adult Children Lives with:  Adult Children Do you feel safe going back to the place where you live?  No Need for family participation in patient care:  Yes (Comment)  Care giving concerns:  Patient and family feel that she needs some short term rehab before she is able to return back home.   Social Worker assessment / plan:  Patient is a 78 year old female who is alert and oriented x2.  Patient is from home with her son, and gets dialysis MWF.  Patient was cofused, she requested that CSW speak with her son [name] to discuss SNF placement options.  CSW contacted patient's son, he stated that patient has been to rehab in the past and she went to [facility].  CSW explained to patient's son PTs recommendation for her to go to SNF first before returning back home.  CSW explained to patient's son how insurance will pay for stay and what to expect at SNF.  Patient's son stated that normally she is fairly independent and uses a walker to get around the house.  Patient's son stated that she is not at her base line, and would like her to go to SNF first before returning back home.  CSW explained it will depend on insurance  authorization, patient's son gave CSW permission to begin bed search in [place].  Patient's son did not have any other questions or concerns.  Employment status:  Retired Nurse, adult PT Recommendations:  [place] / Referral to community resources:  [place]  Patient/Family's Response to care:  Patient and son agreeable to going to SNF for short term rehab.  Patient/Family's Understanding of and Emotional Response to Diagnosis, Current Treatment, and Prognosis: Patient's son is hopeful that she will not have to be in SNF for very long.  Emotional Assessment Appearance:  Appears stated age Attitude/Demeanor/Rapport:    Affect (typically observed):  Appropriate, Stable, Pleasant Orientation:  Oriented to Self Alcohol / Substance use:  Not Applicable Psych involvement (Current and /or in the community):  No (Comment)  Discharge Needs  Concerns to be addressed:  Cognitive Concerns, Care Coordination Readmission within the last 30 days:  No Current discharge risk:  Cognitively Impaired, Lack of support system Barriers to Discharge:  Continued Medical Work up, [employer]   Anell Barr 12/04/2018, 3:41 PM

## 2018-12-04 NOTE — Progress Notes (Signed)
BP is 97/45.  Confirmed with second reading.  Typical SBP is 120's.  Dr. Estanislado Pandy notified.

## 2018-12-04 NOTE — Care Management Important Message (Signed)
Copy of signed Medicare IM left with patient in room. 

## 2018-12-04 NOTE — Progress Notes (Addendum)
[place] [facility] [place] NAME: Tanea Moga    MR#:  [id number]  DATE OF BIRTH:  09-23-1941  Patient seen and evaluated today Has generalized weakness And complained of chest pain last evening EKG and serial troponins were done Cardiology has evaluated the patient Awake but not complaining of any pain today Has cough CT head no acute abnormality  CHIEF COMPLAINT:   Chief Complaint  Patient presents with  . Bradycardia  . Weakness   . REVIEW OF SYSTEMS:  Review of Systems  Constitutional: Negative.   HENT: Negative.   Eyes: Negative.   Respiratory: Has cough Cardiovascular: Negative.   Gastrointestinal: Negative.   Genitourinary: Negative.   Musculoskeletal: Negative.   Skin: Negative.   Neurological: Negative.   Endo/Heme/Allergies: Negative.   Psychiatric/Behavioral: Negative.   All other systems reviewed and are negative.  DRUG ALLERGIES:   Allergies  Allergen Reactions  . Alprazolam Other (See Comments)    Unable to sleep and confusion    VITALS:  Blood pressure (!) 97/45, pulse 86, temperature 98.7 F (37.1 C), temperature source Oral, resp. rate 18, height 5\' 7"  (1.702 m), weight 79.7 kg, SpO2 94 %.  PHYSICAL EXAMINATION:  GENERAL:  78 y.o.-year-old patient lying in the bed with no acute distress.  EYES: Pupils equal, round, reactive to light  No scleral icterus. Extraocular muscles intact.  HEENT: Head atraumatic, normocephalic. Oropharynx and nasopharynx clear.  NECK:  Supple, no jugular venous distention. No thyroid enlargement, no tenderness.  LUNGS: Normal breath sounds bilaterally, no wheezing,scattered  Rales, No rhonchi or crepitation. No use of accessory muscles of respiration.  CARDIOVASCULAR: S1, S2 regular.  No murmurs, rubs, or gallops.  ABDOMEN: Soft, nontender, nondistended. Bowel sounds present. No organomegaly or mass.  EXTREMITIES: No pedal edema, cyanosis, or clubbing.  NEUROLOGIC: No  gross neurological deficit is observed.,. PSYCHIATRIC: Alert awake oriented x2 SKIN: No obvious rash, lesion, or ulcer.    LABORATORY PANEL:   CBC Recent Labs  Lab 12/04/18 0412  WBC 9.4  HGB 8.2*  HCT 26.1*  PLT 169   ------------------------------------------------------------------------------------------------------------------  Chemistries  Recent Labs  Lab 12/02/18 0413  NA 133*  K 4.9  CL 92*  CO2 26  GLUCOSE 98  BUN 84*  CREATININE 10.17*  CALCIUM 9.3   ------------------------------------------------------------------------------------------------------------------  Cardiac Enzymes Recent Labs  Lab 12/04/18 1007  TROPONINI 4.55*   ------------------------------------------------------------------------------------------------------------------  RADIOLOGY:  Dg Chest Port 1 View  Result Date: 12/04/2018 CLINICAL DATA:  Pneumonia, inpatient EXAM: PORTABLE CHEST 1 VIEW COMPARISON:  December 01, 2018, chest x-ray December 11, 2016 FINDINGS: The heart size and mediastinal contours are stable. The heart size is enlarged. Cardiac pacemaker is unchanged. There is central pulmonary vascular congestion. Consolidation of the right perihilar region is identified unchanged. There is no pleural effusion. The visualized skeletal structures are stable. IMPRESSION: Central pulmonary vascular congestion. Patchy consolidation of the right perihilar region, supra and in postop pneumonia or mass is not excluded. Electronically Signed   By: Abelardo Diesel M.D.   On: 12/04/2018 12:50    EKG:   Orders placed or performed during the hospital encounter of 12/02/2018  . EKG 12-Lead  . EKG 12-Lead  . EKG 12-Lead  . EKG 12-Lead  . EKG 12-Lead  . EKG 12-Lead  . EKG 12-Lead  . EKG 12-Lead  . EKG 12-Lead in am (before 8am)  . EKG 12-Lead in am (before 8am)  . EKG 12-Lead  . EKG 12-Lead  ASSESSMENT AND PLAN:   78 year old female patient with essential hypertension, ESRD on  hemodialysis Monday, Wednesday, Friday brought in because of significant weakness.  1.  Chest pain atypical Has elevated troponin which could be from renal failure versus demand ischemia from pneumonia  Discussed with cardiology recommended to stop heparin drip Observe patient Continue aspirin, statin meds  2.Third-degree AV block with bradycardia resolved S/P pace maker placement  off the beta-blockers, clonidine.  Monitor patient on telemetry Patient will be restarted on plavix S/p cardiology f/u  3.  ESRD on hemodialysis Monday, Wednesday, Friday Appreciate nephrology follow-up  4.History of CAD, EF 45%, has history of anteroseptal hypokinesia,  Currently on aspirin,statin Plavix resumed  5.History of gout, continue allopurinol, as needed colchicine.  6.Essential hypertension, continue Norvasc, discontinued clonidine, Toprol due to bradycardia.   7. COPD: Stable  continue DuoNebs as needed.  8.Diabetes mellitus type 2, patient follows up with Dr. Elisabeth Cara, continue Humalog sliding scale with coverage.   Recent HbA1c 6.1  9.  Secondary hyperparathyroidism Continue sevelamer with meals  10.  Anemia of chronic disease Continue EPO shots with hemodialysis treatment  11. Ambulatory Dysfunction and gait instability PT evaluation pending  follow-up with family to check home situation  12.  Healthcare associated pneumonia MRSA pcr negative Start IV cefepime antibiotic  All the records are reviewed and case discussed with Care Management/Social Worker Management plans discussed with the patient, family and they are in agreement.  CODE STATUS: Full code  TOTAL TIME TAKING CARE OF THIS PATIENT: 35 minutes.   POSSIBLE D/C IN 1-2 DAYS, DEPENDING ON CLINICAL CONDITION.  More than 50% time spent in counseling, coordination of care  Reatha Harps  M.D on 12/04/2018 at 1:41 PM  Between 7am to 6pm - Pager - [phone]  After 6pm go to [email or web address] - password [email or web address]  [facility]  Office  [phone]  CC: Primary care physician; [name], MD   Note: This dictation was prepared with Dragon dictation along with smaller phrase technology. Any transcriptional errors that result from this process are unintentional.

## 2018-12-04 NOTE — Progress Notes (Signed)
Pharmacy Antibiotic Note  Laurie Steele is a 78 y.o. female admitted on 11/09/2018 with pneumonia.  Pharmacy has been consulted for cefepime and vancomycin dosing.  Plan: Pt is on HD. Will give cefepime 1g q24H. Pt was on keflex for ppx s/p pacemaker, which is now d/c'ed.   MRSA PCR negative on 11/25/2018. Will not start vancomycin at this time.   Height: 5\' 7"  (170.2 cm) Weight: 175 lb 12.8 oz (79.7 kg) IBW/kg (Calculated) : 61.6  Temp (24hrs), Avg:98.4 F (36.9 C), Min:97.9 F (36.6 C), Max:98.7 F (37.1 C)  Recent Labs  Lab 11/28/18 0310 11/28/18 2324 11/14/2018 0509 12/02/18 0413 12/03/18 2010 12/04/18 0412  WBC 9.3 6.4 8.2  --  6.3 9.4  CREATININE  --  5.23* 8.96* 10.17*  --   --     Estimated Creatinine Clearance: 5 mL/min (A) (by C-G formula based on SCr of 10.17 mg/dL (H)).    Allergies  Allergen Reactions  . Alprazolam Other (See Comments)    Unable to sleep and confusion    Antimicrobials this admission: 1/30 cefepime >>   Dose adjustments this admission: On HD.   Microbiology results: 1/22 MRSA PCR: negative  Thank you for allowing pharmacy to be a part of this patient's care.  Oswald Hillock, PharmD, BCPS Clinical Pharmacist  12/04/2018 1:57 PM

## 2018-12-04 NOTE — Progress Notes (Signed)
This RN noted that patients urine was slightly dark and cloudy. Patient has low grade temp. MD Sainani made aware. MD gave verbal order for UA. Will continue to monitor.   Iran Sizer M

## 2018-12-04 NOTE — Progress Notes (Signed)
[facility]  ROUNDING NOTE   Subjective:   Patient had permanent pacemaker -placed this admission for second-degree heart block that was symptomatic Patient became very lethargic yesterday, elevated troponin.  Placed on heparin drip. Has been hypotensive.  This morning blood pressure 97/45.    Objective:  Vital signs in last 24 hours:  Temp:  [97.9 F (36.6 C)-98.7 F (37.1 C)] 98.7 F (37.1 C) (01/30 0826) Pulse Rate:  [61-102] 86 (01/30 0826) Resp:  [13-20] 18 (01/29 2018) BP: (93-142)/(36-64) 97/45 (01/30 0826) SpO2:  [93 %-100 %] 94 % (01/30 0826) Weight:  [79.4 kg-79.7 kg] 79.7 kg (01/30 0344)  Weight change: -2.4 kg Filed Weights   12/03/18 1251 12/03/18 1341 12/04/18 0344  Weight: 79.4 kg 79.4 kg 79.7 kg    Intake/Output: I/O last 3 completed shifts: In: 3 [I.V.:3] Out: 1540 [Other:1540]   Intake/Output this shift:  No intake/output data recorded.  Physical Exam: General: NAD , chronically ill-appearing  Head:  Moist oral mucosal membranes  Neck: Supple, trachea midline  Lungs:   Limited exam, decreased breath sounds at bases  Heart:  Regular, paced  Abdomen:  Soft, nontender,   Extremities: no peripheral edema.  Neurologic: disoriented, did not answer any questions  Skin: No lesions  Access: Left arm AVF, good bruit    Basic Metabolic Panel: Recent Labs  Lab 11/28/18 2324 11/22/2018 0509 12/02/18 0413  NA 137 134* 133*  K 3.8 4.3 4.9  CL 99 93* 92*  CO2 31 28 26   GLUCOSE 107* 125* 98  BUN 36* 75* 84*  CREATININE 5.23* 8.96* 10.17*  CALCIUM 9.6 9.6 9.3  PHOS 3.2 4.8* 5.6*    Liver Function Tests: Recent Labs  Lab 11/28/18 2324 12/03/2018 0509 12/02/18 0413  ALBUMIN 3.1* 3.3* 3.2*   No results for input(s): LIPASE, AMYLASE in the last 168 hours. No results for input(s): AMMONIA in the last 168 hours.  CBC: Recent Labs  Lab 11/28/18 0310 11/28/18 2324 11/21/2018 0509 12/03/18 2010 12/04/18 0412  WBC 9.3 6.4 8.2 6.3 9.4   HGB 9.0* 8.7* 8.0* 8.2* 8.2*  HCT 28.8* 27.7* 24.9* 25.8* 26.1*  MCV 111.6* 113.1* 109.2* 112.2* 114.0*  PLT 157 136* 164 152 169    Cardiac Enzymes: Recent Labs  Lab 12/03/18 1822 12/04/18 0412 12/04/18 1007  TROPONINI 1.37* 1.32* 4.55*    BNP: Invalid input(s): POCBNP  CBG: Recent Labs  Lab 12/03/18 0742 12/03/18 1340 12/03/18 1647 12/03/18 2115 12/04/18 0823  GLUCAP 93 99 202* 163* 53*    Microbiology: Results for orders placed or performed during the hospital encounter of 11/21/2018  MRSA PCR Screening     Status: None   Collection Time: 11/10/2018 10:10 AM  Result Value Ref Range Status   MRSA by PCR NEGATIVE NEGATIVE Final    Comment:        The GeneXpert MRSA Assay (FDA approved for NASAL specimens only), is one component of a comprehensive MRSA colonization surveillance program. It is not intended to diagnose MRSA infection nor to guide or monitor treatment for MRSA infections. Performed at [facility], [address] [place], [place] [address]   Surgical PCR screen     Status: None   Collection Time: 11/25/2018  5:13 AM  Result Value Ref Range Status   MRSA, PCR NEGATIVE NEGATIVE Final   Staphylococcus aureus NEGATIVE NEGATIVE Final    Comment: (NOTE) The Xpert SA Assay (FDA approved for NASAL specimens in patients 13 years of age and older), is one  component of a comprehensive surveillance program. It is not intended to diagnose infection nor to guide or monitor treatment. Performed at [facility], [address] [place], [place] [address]     Coagulation Studies: Recent Labs    12/03/18 02-23-2009  LABPROT 13.0  INR 0.99    Urinalysis: No results for input(s): COLORURINE, LABSPEC, PHURINE, GLUCOSEU, HGBUR, BILIRUBINUR, KETONESUR, PROTEINUR, UROBILINOGEN, NITRITE, LEUKOCYTESUR in the last 72 hours.  Invalid input(s): APPERANCEUR    Imaging: No results found.   Medications:   . sodium chloride 500 mL (11/09/2018  1145)  . heparin 950 Units/hr (12/03/18 Feb 24, 2023)   . allopurinol  200 mg Oral QHS  . amLODipine  10 mg Oral Daily  . aspirin EC  325 mg Oral Daily  . cephALEXin  250 mg Oral Q6H  . epoetin (EPOGEN/PROCRIT) injection  10,000 Units Intravenous Q M,W,F-HD  . famotidine  20 mg Oral QHS  . feeding supplement (ENSURE ENLIVE)  237 mL Oral BID BM  . ferrous sulfate  325 mg Oral Q breakfast  . furosemide  40 mg Oral Daily  . gabapentin  300 mg Oral QHS  . hydrALAZINE  50 mg Oral 2 times per day on Sun Tue Thu Sat  . insulin aspart  0-5 Units Subcutaneous QHS  . insulin aspart  0-9 Units Subcutaneous TID WC  . latanoprost  1 drop Both Eyes QHS  . mirtazapine  15 mg Oral QHS  . multivitamin  1 tablet Oral Daily  . pravastatin  40 mg Oral QHS  . sevelamer carbonate  2,400 mg Oral TID WC  . sodium chloride flush  3 mL Intravenous Q12H   sodium chloride, acetaminophen, albuterol, amitriptyline, bisacodyl, docusate sodium, guaiFENesin-dextromethorphan, guaiFENesin-dextromethorphan, HYDROcodone-acetaminophen, ipratropium, nitroGLYCERIN, [DISCONTINUED] ondansetron **OR** ondansetron (ZOFRAN) IV, ondansetron (ZOFRAN) IV, senna-docusate, sodium chloride flush  Assessment/ Plan:  Ms. TAKEIRA YANES is a 78 y.o. black female with end stage renal disease on hemodialysis, coronary artery disease, hypertension, diabetes mellitus type II, GERD, glaucoma, gout, who presents with bradycardia, status post permanent pacemaker December 01, 2018  [facility] MWF [place]. Left AVF   1. End Stage Renal Disease: MWF schedule. Next hemodialysis is planned for tomorrow  2. Bradycardia: Permanent pacemaker placed this admission  3. Secondary Hyperparathyroidism:  - sevelamer 3 tablets with meals and 1 with snacks.   4 Anemia of chronic kidney disease:   - EPO with HD treatment  5. Confusion -Work-up in progress.  Non-STEMI.  Cardiology evaluation is ongoing   LOS: [place] 1/30/202011:09  AM

## 2018-12-04 NOTE — Progress Notes (Signed)
PT Cancellation Note  Patient Details Name: Laurie Steele MRN: [id number] DOB: 01-22-1941   Cancelled Treatment:    Reason Eval/Treat Not Completed: Patient not medically ready.  Pt with noted up-trending troponin to 4.55.  Will hold PT at this time and re-attempt PT treatment session at a later date/time as medically appropriate.  [name], PT 12/04/18, 4:23 PM [phone]

## 2018-12-04 NOTE — Progress Notes (Addendum)
Nutrition Follow UP Note   DOCUMENTATION CODES:   Not applicable  INTERVENTION:   Ensure Enlive po BID, each supplement provides 350 kcal and 20 grams of protein  MVI daily   Rena-vite daily   Liberalize diet   Vitamin C 51m po BID  NUTRITION DIAGNOSIS:   Inadequate oral intake related to acute illness as evidenced by meal completion < 25%.  GOAL:   Patient will meet greater than or equal to 90% of their needs  MONITOR:   PO intake, Supplement acceptance, Labs, Weight trends, Skin, I & O's  REASON FOR ASSESSMENT:   LOS    ASSESSMENT:   78year old female patient with essential hypertension, DM, COPD, ESRD on hemodialysis Monday, Wednesday, Friday brought in because of significant weakness and bradycardia    Pt s/p dual chamber pacemaker implantation with Dr. PSaralyn Pilaron 11/22/2018.  Met with pt in room today. Pt lethargic and telling RD she just wants to sleep. Pt's breakfast tray was sitting with only bites eaten from it. Pt's Ensure remained on breakfast tray with only sips from it. Pt eating 60-100% of meals up until 1/26. Pt is on mirtazapine. Recommend continue to encourage supplements. RD will liberalize pt's diet and add daily MVI. RD will also add vitamin C in setting of chronic HD. Per chart, pt with weight gain initially but is back down to her UBW today.     Medications reviewed and include: allopurinol, aspirin, cephalexin, lasix, insulin, epoetin, pepcid, ferrous sulfate, remeron, rena-vite, renvela  Labs reviewed: Na 133(L), Cl 92(L), BUN 84(H), creat 10.17(H), P 5.6(H) Hgb 8.2(L), Hct 26.1(L), MCV 114(H), MCH 35.8(H) Folate 32(H), B12 775 iPTH 111(H)- 2017  NUTRITION - FOCUSED PHYSICAL EXAM:    Most Recent Value  Orbital Region  No depletion  Upper Arm Region  No depletion  Thoracic and Lumbar Region  No depletion  Buccal Region  No depletion  Temple Region  No depletion  Clavicle Bone Region  No depletion  Clavicle and Acromion Bone Region   No depletion  Scapular Bone Region  No depletion  Dorsal Hand  No depletion  Patellar Region  No depletion  Anterior Thigh Region  No depletion  Posterior Calf Region  No depletion  Edema (RD Assessment)  Mild  Hair  Reviewed  Eyes  Reviewed  Mouth  Reviewed  Skin  Reviewed  Nails  Reviewed     Diet Order:   Diet Order            Diet regular Room service appropriate? Yes; Fluid consistency: Thin  Diet effective now             EDUCATION NEEDS:   Education needs have been addressed  Skin:  Skin Assessment: Reviewed RN Assessment(incision chest )  Last BM:  1/28- type 4  Height:   Ht Readings from Last 1 Encounters:  11/13/2018 _0  (1.702 m)    Weight:   Wt Readings from Last 1 Encounters:  12/04/18 79.7 kg    Ideal Body Weight:  61.36 kg  BMI:  Body mass index is 27.53 kg/m.  Estimated Nutritional Needs:   Kcal:  1700-1900kcal/day   Protein:  80-95g/day   Fluid:  UOP + 1L  CKoleen DistanceMS, RD, LDN Pager #- [phone]#- [phone] Hours Pager: [phone]

## 2018-12-04 NOTE — Consult Note (Addendum)
ANTICOAGULATION CONSULT NOTE - Initial Consult  Pharmacy Consult for Heparin Indication: chest pain/ACS  Allergies  Allergen Reactions  . Alprazolam Other (See Comments)    Unable to sleep and confusion    Patient Measurements: Height: 5\' 7"  (170.2 cm) Weight: 175 lb 12.8 oz (79.7 kg) IBW/kg (Calculated) : 61.6 Heparin Dosing Weight: 77.9kg  Vital Signs: Temp: 98.4 F (36.9 C) (01/30 0347) Temp Source: Oral (01/30 0347) BP: 121/58 (01/30 0347) Pulse Rate: 92 (01/30 0347)  Labs: Recent Labs    12/02/18 0413 12/03/18 1822 12/03/18 2010 12/04/18 0412  HGB  --   --  8.2* 8.2*  HCT  --   --  25.8* 26.1*  PLT  --   --  152 169  APTT  --   --  32  --   LABPROT  --   --  13.0  --   INR  --   --  0.99  --   HEPARINUNFRC  --   --   --  0.37  CREATININE 10.17*  --   --   --   TROPONINI  --  1.37*  --  1.32*    Estimated Creatinine Clearance: 5 mL/min (A) (by C-G formula based on SCr of 10.17 mg/dL (H)).   Medical History: Past Medical History:  Diagnosis Date  . Anxiety   . CAD (coronary artery disease)   . Chronic anemia    Dr Inez Pilgrim  . Dysrhythmia   . GERD (gastroesophageal reflux disease)   . Glaucoma   . Gout   . Hx of colonic polyps   . Hyperlipidemia   . Hypertension   . Lower back pain   . Miscarriage   . Myocardial infarction ([place])   . 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 Neuropathy   . 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 Peritoneal dialysis status ([place])   . Renal failure    Dr Holley Raring  . Renal insufficiency   . 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 Vaginal delivery    x 4    Medications:  No pta anticoagulant  Assessment: Pharmacy has been consulted for heparin drip for ACS/STEMI  Goal of Therapy:  INR 2-3 Heparin level 0.3-0.7 units/ml Monitor platelets by anticoagulation protocol: Yes   Plan:  01/30 @ 0400 HL 0.37 therapeutic.  Will continue at 950 units/hr and will recheck HL @ 1200. trops barely decreasing 1.37 >> 1.32, hgb low stable, will continue to monitor.  Tobie Lords, PharmD, BCPS Clinical Pharmacist 12/04/2018 5:15 AM

## 2018-12-05 DIAGNOSIS — R001 Bradycardia, unspecified: Secondary | ICD-10-CM

## 2018-12-05 LAB — GLUCOSE, CAPILLARY
Glucose-Capillary: 189 mg/dL — ABNORMAL HIGH (ref 70–99)
Glucose-Capillary: 139 mg/dL — ABNORMAL HIGH (ref 70–99)
Glucose-Capillary: 145 mg/dL — ABNORMAL HIGH (ref 70–99)
Glucose-Capillary: 165 mg/dL — ABNORMAL HIGH (ref 70–99)

## 2018-12-05 LAB — CBC
HCT: 24.1 % — ABNORMAL LOW (ref 36.0–46.0)
Hemoglobin: 7.4 g/dL — ABNORMAL LOW (ref 12.0–15.0)
MCH: 34.9 pg — ABNORMAL HIGH (ref 26.0–34.0)
MCHC: 30.7 g/dL (ref 30.0–36.0)
MCV: 113.7 fL — ABNORMAL HIGH (ref 80.0–100.0)
Platelets: 165 10*3/uL (ref 150–400)
RBC: 2.12 MIL/uL — ABNORMAL LOW (ref 3.87–5.11)
RDW: 14.8 % (ref 11.5–15.5)
WBC: 11.9 10*3/uL — ABNORMAL HIGH (ref 4.0–10.5)
nRBC: 0.3 % — ABNORMAL HIGH (ref 0.0–0.2)

## 2018-12-05 LAB — HEPATIC FUNCTION PANEL
ALT: 21 U/L (ref 0–44)
AST: 160 U/L — ABNORMAL HIGH (ref 15–41)
Albumin: 3.1 g/dL — ABNORMAL LOW (ref 3.5–5.0)
Alkaline Phosphatase: 88 U/L (ref 38–126)
Bilirubin, Direct: 0.2 mg/dL (ref 0.0–0.2)
Indirect Bilirubin: 0.6 mg/dL (ref 0.3–0.9)
Total Bilirubin: 0.8 mg/dL (ref 0.3–1.2)
Total Protein: 6.9 g/dL (ref 6.5–8.1)

## 2018-12-05 MED ORDER — VANCOMYCIN HCL IN DEXTROSE 750-5 MG/150ML-% IV SOLN: 750.0000 mg | INTRAVENOUS | Status: DC

## 2018-12-05 MED ORDER — VANCOMYCIN HCL 10 G IV SOLR
1750.0000 mg | Freq: Once | INTRAVENOUS | Status: AC
  Administered 2018-12-05: 1750 mg via INTRAVENOUS
  Filled 2018-12-05: qty 1750

## 2018-12-05 NOTE — Progress Notes (Signed)
Post HD Assessment    12/05/18 1225  Neurological  Orientation Level Oriented to person  Respiratory  Respiratory Pattern Regular;Unlabored  Chest Assessment Chest expansion symmetrical  Bilateral Breath Sounds Diminished  Cough Congested;Non-productive  Cardiac  Pulse Regular  Heart Sounds S1, S2  ECG Monitor Yes  Cardiac Rhythm Ventricular paced  Antiarrhythmic device Yes  Antiarrhythmic device  Antiarrhythmic device Permanent Pacemaker  Vascular  R Radial Pulse +2  L Radial Pulse +2  Edema Generalized  Generalized Edema +1  Psychosocial  Psychosocial (WDL) WDL

## 2018-12-05 NOTE — Consult Note (Signed)
Reason for Consult: Altered mental status Referring Physician: Candy Sledge, MD  CC: Altered mental status  HPI: Laurie Steele is an 78 y.o. female multiple medical issues most pertinent CAD, hyperlipidemia, hypertension, ESRD on HD, and myocardial infarction presenting to the ED from dialysis on 11/28/2018 with symptomatic bradycardia secondary to second-degree AV block with 2-1 conduction and heart rate of 40 bpm.  Patient had a dual-chamber pacemaker placement on 12/02/2018.  Patient is seen in consultation with hospitalist for evaluation of acute onset encephalopathy. Per patient's grand daughter who is currently at the bedside, patient has progressively become altered since admission.  Prior to hospital admission, patient was able to perform her own ADLs with minimal assistance, she lives with family and is usually more alert, oriented and talkative at baseline.  Patient was noted to be more lethargic and confused on 12/03/2018. On 12/04/2018 she developed low-grade temp and was hypotensive for chest x-ray and CT head was obtained for further evaluation.  X-ray showed central pulmonary vascular congestion and patchy consolidation of the right perihilar region concerning for possible pneumonia.  CT head did not show evidence of acute intracranial abnormality.  Labs yesterday revealed hemoglobin 7.4,  HCT 26.1, WBC 11.9, glucose 123, elevated troponins 4.55, UA positive for UTI, blood cultures pending.  Patient is therefore started on IV antibiotics.   Past Medical History:  Diagnosis Date  . Anxiety   . CAD (coronary artery disease)   . Chronic anemia    Dr Inez Pilgrim  . Dysrhythmia   . GERD (gastroesophageal reflux disease)   . Glaucoma   . Gout   . Hx of colonic polyps   . Hyperlipidemia   . Hypertension   . Lower back pain   . Miscarriage   . Myocardial infarction ([place])   . 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 Neuropathy   . NIDDM (non-insulin dependent  diabetes mellitus)    with retinopathy , and nephropathy  . Peritoneal dialysis status ([place])   . Renal failure    Dr Holley Raring  . Renal insufficiency   . 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 Vaginal delivery    x 4    Past Surgical History:  Procedure Laterality Date  . ABDOMINAL HYSTERECTOMY  1983  . AV FISTULA PLACEMENT Left 09/21/2016   Procedure: ARTERIOVENOUS (AV) FISTULA CREATION ( BRACHIOCEPHALIC );  Surgeon: Katha Cabal, MD;  Location: [facility] ORS;  Service: Vascular;  Laterality: Left;  . BACK SURGERY  6/08   Dr Collier Salina  . BACK SURGERY  1987   disc  . CHOLECYSTECTOMY    . DIALYSIS/PERMA CATHETER REMOVAL N/A 02/25/2017   Procedure: Dialysis/Perma Catheter Removal;  Surgeon: Algernon Huxley, MD;  Location: [facility];  Service: Cardiovascular;  Laterality: N/A;  . INSERTION OF DIALYSIS CATHETER  2017  . PACEMAKER INSERTION Right 11/30/2018   Procedure: INSERTION PACEMAKER;  Surgeon: Isaias Cowman, MD;  Location: [facility] ORS;  Service: Cardiovascular;  Laterality: Right;  . PERITONEAL CATHETER INSERTION    . REMOVAL OF A DIALYSIS CATHETER N/A 09/21/2016   Procedure: REMOVAL OF A DIALYSIS CATHETER ( PERITONEAL DIALYSIS CATH );  Surgeon: Katha Cabal, MD;  Location: [facility] ORS;  Service: Vascular;  Laterality: N/A;  . VITRECTOMY AND CATARACT Left 11/04/2017   Procedure: VITRECTOMY AND CATARACT;  Surgeon: Leandrew Koyanagi, MD;  Location: [facility] ORS;  Service: Ophthalmology;  Laterality: Left;  US00:51.6 AP%21.8 JSH70.26 FLUID LOT #  [id number] H    Family History  Problem Relation Age of Onset  . Heart attack Father   . Hypertension Mother   . Hyperlipidemia Mother   . Coronary artery disease Other        strong fam hx  . Kidney failure Brother   . Breast cancer Neg Hx     Social History:  reports that she quit smoking about 28 years ago. She has never used smokeless tobacco. She reports that she does not drink alcohol or use  drugs.  Allergies  Allergen Reactions  . Alprazolam Other (See Comments)    Unable to sleep and confusion   Medications:  I have reviewed the patient's current medications. Prior to Admission:  Medications Prior to Admission  Medication Sig Dispense Refill Last Dose  . allopurinol (ZYLOPRIM) 100 MG tablet Take 200 mg by mouth at bedtime.   11/23/2018 at 2200  . amLODipine (NORVASC) 10 MG tablet Take 10 mg by mouth daily.  0 11/23/2018 at 1000  . aspirin 325 MG tablet Take 325 mg by mouth daily.   11/23/2018 at 1000  . brimonidine (ALPHAGAN) 0.2 % ophthalmic solution Apply to eye.   11/23/2018 at 1000  . cloNIDine (CATAPRES) 0.1 MG tablet Take 0.1 mg by mouth 2 (two) times daily. TAKE 0.1MG  BY MOUTH ON Sunday,TUESDAY,THURSDAY, AND SATURDAY   11/23/2018 at 1000  . clopidogrel (PLAVIX) 75 MG tablet Take 1 tablet (75 mg total) by mouth daily. 30 tablet 0 11/23/2018 at 1000  . colchicine 0.6 MG tablet Take 0.5 tablets (0.3 mg total) by mouth 2 (two) times a week. 4 tablet 2 Past Week at Unknown time  . dorzolamide-timolol (COSOPT) 22.3-6.8 MG/ML ophthalmic solution INSTILL 1 DROP IN LEFT EYE TWICE A DAY  6 11/23/2018 at 1000  . famotidine (PEPCID) 20 MG tablet Take 20 mg by mouth at bedtime.    11/23/2018 at 2200  . ferrous sulfate 325 (65 FE) MG tablet Take 1 tablet (325 mg total) by mouth daily with breakfast. 30 tablet 3 11/23/2018 at 1000  . furosemide (LASIX) 40 MG tablet Take 1 tablet (40 mg total) by mouth daily. 30 tablet 2 11/23/2018 at 1000  . gabapentin (NEURONTIN) 300 MG capsule Take 300 mg by mouth 3 (three) times daily.  5 11/23/2018 at 2200  . glimepiride (AMARYL) 1 MG tablet Take 1 mg by mouth daily with breakfast.   11/23/2018 at 1000  . glipiZIDE (GLUCOTROL) 5 MG tablet Take 5 mg by mouth daily.  2 11/23/2018 at 1000  . hydrALAZINE (APRESOLINE) 50 MG tablet Take 50 mg by mouth 2 (two) times daily. ONLY ON Tuesday,THURSDAY,SATURDAY ,AND SUNDAY   11/23/2018 at 1000  . latanoprost (XALATAN)  0.005 % ophthalmic solution Place 1 drop into both eyes at bedtime.  6 11/23/2018 at 2200  . metoprolol succinate (TOPROL-XL) 25 MG 24 hr tablet Take 1 tablet (25 mg total) by mouth at bedtime. 30 tablet 0 11/23/2018 at 2000  . mirtazapine (REMERON) 15 MG tablet Take 15 mg by mouth at bedtime.  0 11/23/2018 at 2200  . multivitamin (RENA-VIT) TABS tablet Take 1 tablet by mouth daily.   11/23/2018 at 1000  . pravastatin (PRAVACHOL) 40 MG tablet Take 40 mg by mouth at bedtime.    11/23/2018 at 2200  . sevelamer carbonate (RENVELA) 800 MG tablet Take 800 mg by mouth 3 (three) times daily with meals.   11/23/2018 at 1800  . acetaminophen (TYLENOL) 650 MG CR tablet Take 650 mg by mouth every  8 (eight) hours as needed for pain.   prn at prn  . amitriptyline (ELAVIL) 25 MG tablet Take 25 mg by mouth at bedtime as needed. for sleep  0 prn at prn  . ASPERCREME W/LIDOCAINE 4 % CREA Apply topically as needed  5 Taking  . docusate sodium (COLACE) 100 MG capsule Take 1 capsule (100 mg total) by mouth daily as needed for mild constipation. 30 capsule 0 prn at prn  . feeding supplement, ENSURE ENLIVE, (ENSURE ENLIVE) LIQD Take 237 mLs by mouth 2 (two) times daily between meals. 237 mL 12 Taking  . fluticasone (FLONASE) 50 MCG/ACT nasal spray Place into the nose.   prn at prn  . Infant Care Products (DERMACLOUD) CREA Apply topically.   Taking  . insulin aspart (NOVOLOG) 100 UNIT/ML injection Inject 0-5 Units into the skin at bedtime. CBG < 70: implement hypoglycemia protocol  CBG 70 - 120: 0 units  CBG 121 - 150: 2 units  CBG 151 - 200: 3 units  CBG 201 - 250: 5 units  CBG 251 - 300: 8 units  CBG 301 - 350: 11 units  CBG 351 - 400: 15 units (Patient not taking: Reported on 11/06/2018) 10 mL 0 Not Taking at Unknown time  . ipratropium (ATROVENT) 0.03 % nasal spray Place 1 spray into both nostrils 3 (three) times daily.    prn at prn  . ipratropium-albuterol (DUONEB) 0.5-2.5 (3) MG/3ML SOLN Take 3 mLs by nebulization  every 6 (six) hours as needed. (Patient not taking: Reported on 11/13/2018) 360 mL 0 Not Taking at Unknown time  . oxyCODONE-acetaminophen (PERCOCET) 5-325 MG tablet Take 1 tablet by mouth every 4 (four) hours as needed for severe pain. 12 tablet 0   . pantoprazole (PROTONIX) 40 MG tablet Take 1 tablet (40 mg total) by mouth daily. 30 tablet 1 prn at prn  . traMADol (ULTRAM) 50 MG tablet Take by mouth every 6 (six) hours as needed.   prn at prn   Scheduled: . allopurinol  200 mg Oral QHS  . amLODipine  10 mg Oral Daily  . aspirin EC  325 mg Oral Daily  . clopidogrel  75 mg Oral Daily  . epoetin (EPOGEN/PROCRIT) injection  10,000 Units Intravenous Q M,W,F-HD  . famotidine  20 mg Oral QHS  . feeding supplement (ENSURE ENLIVE)  237 mL Oral BID BM  . ferrous sulfate  325 mg Oral Q breakfast  . furosemide  40 mg Oral Daily  . gabapentin  300 mg Oral QHS  . heparin  5,000 Units Subcutaneous Q8H  . hydrALAZINE  50 mg Oral 2 times per day on Sun Tue Thu Sat  . insulin aspart  0-5 Units Subcutaneous QHS  . insulin aspart  0-9 Units Subcutaneous TID WC  . latanoprost  1 drop Both Eyes QHS  . mirtazapine  15 mg Oral QHS  . multivitamin  1 tablet Oral Daily  . multivitamin with minerals  1 tablet Oral Daily  . pravastatin  40 mg Oral QHS  . sevelamer carbonate  2,400 mg Oral TID WC  . sodium chloride flush  3 mL Intravenous Q12H  . vitamin C  500 mg Oral BID    ROS: Unable to obtain from patient due to altered mental status.  Physical Examination: Blood pressure (!) 106/47, pulse 82, temperature 99.6 F (37.6 C), temperature source Oral, resp. rate 19, height 5\' 7"  (1.702 m), weight 81.8 kg, SpO2 95 %.  HEENT-  Normocephalic, no lesions, without  obvious abnormality.  Normal external eye and conjunctiva.  Normal TM's bilaterally.  Normal auditory canals and external ears. Normal external nose, mucus membranes and septum.  Normal pharynx. Cardiovascular- S1, S2 normal, pulses palpable  throughout   Lungs- chest clear, no wheezing, rales, normal symmetric air entry Abdomen- soft, non-tender; bowel sounds normal; no masses,  no organomegaly Extremities- no edema Lymph-no adenopathy palpable Musculoskeletal-no joint tenderness, deformity or swelling Skin-warm and dry, no hyperpigmentation, vitiligo, or suspicious lesions  Neurological Exam   Mental Status: Drowsy. Unable to assess orientation due to current mental status. No verbalizations noted. Opens eyes to deep sternal rub.  Does not follow commands Cranial Nerves: II: Discs flat bilaterally;,patient does not respond confrontation bilaterally  pupils equal, round, reactive to light and accommodation III,IV, VI: ptosis not present, patient unable to track V,VII: unable to assess VIII: hearing normal bilaterally IX,X: gag reflex present XI: unable to assess XII: Unable to assess Motor: Bilateral upper and lower extremity spontaneously and purposefully.  Patient able to break gravity Tone and bulk:normal tone throughout; no atrophy noted Sensory: Respond to noxious stimuli in all extremity. Deep Tendon Reflexes: 2+ and symmetric throughout Plantars: Right: mute                              Left: mute Cerebellar: Unable to accurately assess. Gait: not tested due to safety concerns  Data Reviewed  Laboratory Studies:   Basic Metabolic Panel: Recent Labs  Lab 11/28/18 2324 11/10/2018 0509 12/02/18 0413  NA 137 134* 133*  K 3.8 4.3 4.9  CL 99 93* 92*  CO2 31 28 26   GLUCOSE 107* 125* 98  BUN 36* 75* 84*  CREATININE 5.23* 8.96* 10.17*  CALCIUM 9.6 9.6 9.3  PHOS 3.2 4.8* 5.6*    Liver Function Tests: Recent Labs  Lab 11/28/18 2324 11/06/2018 0509 12/02/18 0413 12/05/18 1533  AST  --   --   --  160*  ALT  --   --   --  21  ALKPHOS  --   --   --  88  BILITOT  --   --   --  0.8  PROT  --   --   --  6.9  ALBUMIN 3.1* 3.3* 3.2* 3.1*   No results for input(s): LIPASE, AMYLASE in the last 168 hours. No  results for input(s): AMMONIA in the last 168 hours.  CBC: Recent Labs  Lab 11/28/18 2324 11/30/2018 0509 12/03/18 2010 12/04/18 0412 12/05/18 0539  WBC 6.4 8.2 6.3 9.4 11.9*  HGB 8.7* 8.0* 8.2* 8.2* 7.4*  HCT 27.7* 24.9* 25.8* 26.1* 24.1*  MCV 113.1* 109.2* 112.2* 114.0* 113.7*  PLT 136* 164 152 169 165    Cardiac Enzymes: Recent Labs  Lab 12/03/18 1822 12/04/18 0412 12/04/18 1007  TROPONINI 1.37* 1.32* 4.55*    BNP: Invalid input(s): POCBNP  CBG: Recent Labs  Lab 12/04/18 1637 12/04/18 2100 12/05/18 0731 12/05/18 1315 12/05/18 1644  GLUCAP 122* 143* 189* 139* 145*    Microbiology: Results for orders placed or performed during the hospital encounter of 11/20/2018  MRSA PCR Screening     Status: None   Collection Time: 11/21/2018 10:10 AM  Result Value Ref Range Status   MRSA by PCR NEGATIVE NEGATIVE Final    Comment:        The GeneXpert MRSA Assay (FDA approved for NASAL specimens only), is one component of a comprehensive MRSA colonization surveillance program. It is  not intended to diagnose MRSA infection nor to guide or monitor treatment for MRSA infections. Performed at [facility], [address] [place], [place] [address]   Surgical PCR screen     Status: None   Collection Time: 11/07/2018  5:13 AM  Result Value Ref Range Status   MRSA, PCR NEGATIVE NEGATIVE Final   Staphylococcus aureus NEGATIVE NEGATIVE Final    Comment: (NOTE) The Xpert SA Assay (FDA approved for NASAL specimens in patients 59 years of age and older), is one component of a comprehensive surveillance program. It is not intended to diagnose infection nor to guide or monitor treatment. Performed at [facility], [address] [place], [place] [address]     Coagulation Studies: Recent Labs    12/03/18 2010  LABPROT 13.0  INR 0.99    Urinalysis:  Recent Labs  Lab 12/04/18 2025  COLORURINE YELLOW*  LABSPEC 1.011  PHURINE 8.0  GLUCOSEU 50*   HGBUR MODERATE*  BILIRUBINUR NEGATIVE  KETONESUR NEGATIVE  PROTEINUR 100*  NITRITE NEGATIVE  LEUKOCYTESUR LARGE*    Lipid Panel:     Component Value Date/Time   CHOL 156 01/25/2017 0420   CHOL 164 11/29/2012 0251   TRIG 155 (H) 01/25/2017 0420   TRIG 293 (H) 11/29/2012 0251   HDL 37 (L) 01/25/2017 0420   HDL 37 (L) 11/29/2012 0251   CHOLHDL 4.2 01/25/2017 0420   VLDL 31 01/25/2017 0420   VLDL 59 (H) 11/29/2012 0251   LDLCALC 88 01/25/2017 0420   LDLCALC 68 11/29/2012 0251    HgbA1C:  Lab Results  Component Value Date   HGBA1C 6.6 (H) 01/24/2017    Urine Drug Screen:      Component Value Date/Time   LABOPIA NONE DETECTED 02/28/2018 1259   COCAINSCRNUR NONE DETECTED 02/28/2018 1259   LABBENZ NONE DETECTED 02/28/2018 1259   AMPHETMU NONE DETECTED 02/28/2018 1259   THCU NONE DETECTED 02/28/2018 1259   LABBARB NONE DETECTED 02/28/2018 1259    Alcohol Level: No results for input(s): ETH in the last 168 hours.  Other results: EKG: unchanged from previous tracings, sinus bradycardia.  Imaging: Ct Head Wo Contrast  Result Date: 12/04/2018 CLINICAL DATA:  Altered mental status EXAM: CT HEAD WITHOUT CONTRAST TECHNIQUE: Contiguous axial images were obtained from the base of the skull through the vertex without intravenous contrast. COMPARISON:  05/22/2018 FINDINGS: Brain: There is no mass, hemorrhage or extra-axial collection. The size and configuration of the ventricles and extra-axial CSF spaces are normal. There is hypoattenuation of the white matter, most commonly indicating chronic small vessel disease. Vascular: Atherosclerotic calcification of the vertebral and internal carotid arteries at the skull base. No abnormal hyperdensity of the major intracranial arteries or dural venous sinuses. Skull: The visualized skull base, calvarium and extracranial soft tissues are normal. Sinuses/Orbits: No fluid levels or advanced mucosal thickening of the visualized paranasal sinuses.  No mastoid or middle ear effusion. The orbits are normal. IMPRESSION: 1. No acute intracranial abnormality. 2. Findings of chronic small vessel disease. Electronically Signed   By: Ulyses Jarred M.D.   On: 12/04/2018 13:53   Dg Chest Port 1 View  Result Date: 12/04/2018 CLINICAL DATA:  Pneumonia, inpatient EXAM: PORTABLE CHEST 1 VIEW COMPARISON:  December 01, 2018, chest x-ray December 11, 2016 FINDINGS: The heart size and mediastinal contours are stable. The heart size is enlarged. Cardiac pacemaker is unchanged. There is central pulmonary vascular congestion. Consolidation of the right perihilar region is identified unchanged. There is no pleural effusion.  The visualized skeletal structures are stable. IMPRESSION: Central pulmonary vascular congestion. Patchy consolidation of the right perihilar region, supra and in postop pneumonia or mass is not excluded. Electronically Signed   By: Abelardo Diesel M.D.   On: 12/04/2018 12:50   Patient seen and examined.  Clinical course and management discussed.  Necessary edits performed.  I agree with the above.  Assessment and plan of care developed and discussed below.    Assessment: 78 y.o female with multiple medical issues most pertinent CAD, hyperlipidemia, hypertension, ESRD on HD, and myocardial infarction initially admitted on 11/15/2018 with symptomatic bradycardia secondary to second-degree AV block with 2-1 conduction and heart rate of 40 bpm.  Dual-chamber pacemaker placement on 11/06/2018.  Now altered in the setting of PNA.  She is currently being treated with IV antibiotics pending further work-up.  CT head reviewed and shows no acute intracranial abnormality.  Lab work pending.    Plan: 1. Agree with current medical management of underlying conditions 2. Serum LFT's 3. Will continue to follow   This patient was staffed with Dr. Magda Paganini, Doy Mince who personally evaluated patient, reviewed documentation and agreed with assessment and plan of care as  above.  Rufina Falco, DNP, FNP-BC Board certified Nurse Practitioner Neurology Department   Alexis Goodell, MD Neurology [phone]  12/05/2018  6:30 PM   12/05/2018, 6:24 PM

## 2018-12-05 NOTE — Progress Notes (Signed)
Pharmacy Antibiotic Note  Laurie Steele is a 78 y.o. female admitted on 11/28/2018 with pneumonia.  Pharmacy has been consulted for cefepime and vancomycin dosing. Pt is on HD MWF.   Plan: Patient returned from HD today. Will order a loading dose of vancomycin 1750 mg x 1 post dialysis. And will order a maintenance dose of 750 mg on post dialysis days. Will order vancomycin trough prior to the 4th dose. MRSA PCR negative on 1/22, but patient seems to be worsening.   Will continue cefepime 1 g q24.   Height: 5\' 7"  (170.2 cm) Weight: 180 lb 5.4 oz (81.8 kg) IBW/kg (Calculated) : 61.6  Temp (24hrs), Avg:99 F (37.2 C), Min:97.6 F (36.4 C), Max:99.9 F (37.7 C)  Recent Labs  Lab 11/28/18 2324 11/21/2018 0509 12/02/18 0413 12/03/18 2010 12/04/18 0412 12/05/18 0539  WBC 6.4 8.2  --  6.3 9.4 11.9*  CREATININE 5.23* 8.96* 10.17*  --   --   --     Estimated Creatinine Clearance: 5 mL/min (A) (by C-G formula based on SCr of 10.17 mg/dL (H)).    Allergies  Allergen Reactions  . Alprazolam Other (See Comments)    Unable to sleep and confusion    Antimicrobials this admission: 1/30 cefepime >>  1/31 vancomycin >>   Dose adjustments this admission: On HD.   Microbiology results: 1/22 MRSA PCR: negative Thank you for allowing pharmacy to be a part of this patient's care.  Oswald Hillock 12/05/2018 1:18 PM

## 2018-12-05 NOTE — Progress Notes (Signed)
SLP Cancellation Note  Patient Details Name: Laurie Steele MRN: [id number] DOB: 1941-08-27   Cancelled treatment:       Reason Eval/Treat Not Completed: Patient at procedure or test/unavailable(chart reviewed.). Pt is currently off floor at HD. Pt was seen ~1 year ago and placed on a pureed diet w/ thin liquids during the admission.  ST services will f/u w/ [facility] today if time permitting; tomorrow. Recommend general aspiration precautions.    Orinda Kenner, MS, CCC-SLP [name] 12/05/2018, 1:21 PM

## 2018-12-05 NOTE — Progress Notes (Signed)
Post HD Tx    12/05/18 1215  Vital Signs  Temp 98.7 F (37.1 C)  Temp Source Oral  Pulse Rate 86  Pulse Rate Source Monitor  Resp (!) 22  BP 107/77  BP Location Right Arm  BP Method Automatic  Patient Position (if appropriate) Lying  Pain Assessment  Pain Scale 0-10  Pain Score 0  Dialysis Weight  Weight 81.8 kg  Type of Weight Post-Dialysis  Post-Hemodialysis Assessment  Rinseback Volume (mL) 250 mL  KECN 65.7 V  Dialyzer Clearance Lightly streaked  Duration of HD Treatment -hour(s) 3 hour(s)  Hemodialysis Intake (mL) 1300 mL  UF Total -Machine (mL) 448 mL  Net UF (mL) -852 mL  Post-Hemodialysis Comments tx ended 30 min early r/t asymptomatic hypotension MD notified/Aware  AVG/AVF Arterial Site Held (minutes) 10 minutes  AVG/AVF Venous Site Held (minutes) 10 minutes  Fistula / Graft Left Upper arm Arteriovenous fistula  No Placement Date or Time found.   Placed prior to admission: Yes  Orientation: Left  Access Location: Upper arm  Access Type: Arteriovenous fistula  Site Condition No complications  Fistula / Graft Assessment Present;Thrill;Bruit  Status Deaccessed  Drainage Description None

## 2018-12-05 NOTE — Progress Notes (Signed)
HD Tx started w/o complication    07/25/79 0906  Vital Signs  Pulse Rate 82  Pulse Rate Source Monitor  Resp (!) 30  BP (!) 112/24  BP Location Right Arm  BP Method Automatic  Patient Position (if appropriate) Lying  During Hemodialysis Assessment  Blood Flow Rate (mL/min) 400 mL/min  Arterial Pressure (mmHg) -60 mmHg  Venous Pressure (mmHg) 150 mmHg  Transmembrane Pressure (mmHg) 70 mmHg  Ultrafiltration Rate (mL/min) 0 mL/min  Dialysate Flow Rate (mL/min) 600 ml/min  Conductivity: Machine  13.8  HD Safety Checks Performed Yes  Dialysis Fluid Bolus Normal Saline  Bolus Amount (mL) 250 mL  Intra-Hemodialysis Comments Tx initiated  Fistula / Graft Left Upper arm Arteriovenous fistula  No Placement Date or Time found.   Placed prior to admission: Yes  Orientation: Left  Access Location: Upper arm  Access Type: Arteriovenous fistula  Status Accessed

## 2018-12-05 NOTE — Progress Notes (Signed)
PT Cancellation Note  Patient Details Name: Laurie Steele MRN: [id number] DOB: [birth year]   Cancelled Treatment:    Reason Eval/Treat Not Completed: Patient at procedure or test/unavailable.  Pt currently off floor at dialysis.  Will re-attempt PT treatment session at a later date/time as medically appropriate.  [name], PT 12/05/18, 11:24 AM [phone]

## 2018-12-05 NOTE — Progress Notes (Signed)
Pre HD Assessment    12/05/18 0900  Neurological  Level of Consciousness Responds to Voice  Orientation Level Oriented to person  Respiratory  Respiratory Pattern Regular;Unlabored  Chest Assessment Chest expansion symmetrical  Bilateral Breath Sounds Diminished  Cough Congested;Non-productive  Cardiac  Pulse Regular  Heart Sounds S1, S2  ECG Monitor Yes  Cardiac Rhythm Ventricular paced  Antiarrhythmic device Yes  Antiarrhythmic device  Antiarrhythmic device Permanent Pacemaker  Vascular  R Radial Pulse +2  L Radial Pulse +2  Psychosocial  Psychosocial (WDL) WDL

## 2018-12-05 NOTE — Progress Notes (Addendum)
[facility] [place] NAME: Tinlee Navarrette    MR#:  [id number]  DATE OF BIRTH:  Nov 01, 1941  Patient seen and evaluated today at dialysis Has generalized weakness Lethargic again opens eyes to loud verbal commands Has cough CT head no acute abnormality  CHIEF COMPLAINT:   Chief Complaint  Patient presents with  . Bradycardia  . Weakness   . REVIEW OF SYSTEMS:  Could not be obtained secondary to lethargy and confusion  DRUG ALLERGIES:   Allergies  Allergen Reactions  . Alprazolam Other (See Comments)    Unable to sleep and confusion    VITALS:  Blood pressure 107/77, pulse 86, temperature 98.7 F (37.1 C), temperature source Oral, resp. rate (!) 22, height 5\' 7"  (1.702 m), weight 81.8 kg, SpO2 100 %.  PHYSICAL EXAMINATION:  GENERAL:  78 y.o.-year-old patient lying in the bed with no acute distress.  EYES: Pupils equal, round, reactive to light  No scleral icterus. Extraocular muscles intact.  HEENT: Head atraumatic, normocephalic. Oropharynx and nasopharynx clear.  NECK:  Supple, no jugular venous distention. No thyroid enlargement, no tenderness.  LUNGS: Normal breath sounds bilaterally, no wheezing,scattered  Rales, No rhonchi or crepitation. No use of accessory muscles of respiration.  CARDIOVASCULAR: S1, S2 regular.  No murmurs, rubs, or gallops.  ABDOMEN: Soft, nontender, nondistended. Bowel sounds present. No organomegaly or mass.  EXTREMITIES: No pedal edema, cyanosis, or clubbing.  NEUROLOGIC: No gross neurological deficit is observed.,. PSYCHIATRIC: Alert awake oriented x1 SKIN: No obvious rash, lesion, or ulcer.    LABORATORY PANEL:   CBC Recent Labs  Lab 12/05/18 0539  WBC 11.9*  HGB 7.4*  HCT 24.1*  PLT 165   ------------------------------------------------------------------------------------------------------------------  Chemistries  Recent Labs  Lab 12/02/18 0413  NA 133*  K 4.9  CL 92*   CO2 26  GLUCOSE 98  BUN 84*  CREATININE 10.17*  CALCIUM 9.3   ------------------------------------------------------------------------------------------------------------------  Cardiac Enzymes Recent Labs  Lab 12/04/18 1007  TROPONINI 4.55*   ------------------------------------------------------------------------------------------------------------------  RADIOLOGY:  Ct Head Wo Contrast  Result Date: 12/04/2018 CLINICAL DATA:  Altered mental status EXAM: CT HEAD WITHOUT CONTRAST TECHNIQUE: Contiguous axial images were obtained from the base of the skull through the vertex without intravenous contrast. COMPARISON:  05/22/2018 FINDINGS: Brain: There is no mass, hemorrhage or extra-axial collection. The size and configuration of the ventricles and extra-axial CSF spaces are normal. There is hypoattenuation of the white matter, most commonly indicating chronic small vessel disease. Vascular: Atherosclerotic calcification of the vertebral and internal carotid arteries at the skull base. No abnormal hyperdensity of the major intracranial arteries or dural venous sinuses. Skull: The visualized skull base, calvarium and extracranial soft tissues are normal. Sinuses/Orbits: No fluid levels or advanced mucosal thickening of the visualized paranasal sinuses. No mastoid or middle ear effusion. The orbits are normal. IMPRESSION: 1. No acute intracranial abnormality. 2. Findings of chronic small vessel disease. Electronically Signed   By: Ulyses Jarred M.D.   On: 12/04/2018 13:53   Dg Chest Port 1 View  Result Date: 12/04/2018 CLINICAL DATA:  Pneumonia, inpatient EXAM: PORTABLE CHEST 1 VIEW COMPARISON:  December 01, 2018, chest x-ray December 11, 2016 FINDINGS: The heart size and mediastinal contours are stable. The heart size is enlarged. Cardiac pacemaker is unchanged. There is central pulmonary vascular congestion. Consolidation of the right perihilar region is identified unchanged. There is no pleural  effusion. The visualized skeletal structures are stable. IMPRESSION: Central pulmonary vascular congestion. Patchy consolidation  of the right perihilar region, supra and in postop pneumonia or mass is not excluded. Electronically Signed   By: Abelardo Diesel M.D.   On: 12/04/2018 12:50    EKG:   Orders placed or performed during the hospital encounter of 11/25/2018  . EKG 12-Lead  . EKG 12-Lead  . EKG 12-Lead  . EKG 12-Lead  . EKG 12-Lead  . EKG 12-Lead  . EKG 12-Lead  . EKG 12-Lead  . EKG 12-Lead in am (before 8am)  . EKG 12-Lead in am (before 8am)  . EKG 12-Lead  . 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  1.  Healthcare associated pneumonia Start patient on IV vancomycin antibiotic IV cefepime antibiotic Follow-up cultures  2.  Acute metabolic encephalopathy probably secondary to pneumonia and urinary tract infection IV antibiotics CT head no acute abnormality  3.  Sepsis secondary to pneumonia and UTI Broad-spectrum antibiotics Follow-up WBC count and cultures Follow-up blood and urine cultures  4.  Chest pain atypical Has elevated troponin which could be from renal failure versus demand ischemia from pneumonia  Discussed with cardiology recommended to stop heparin drip Observe patient Continue aspirin, statin meds  5.Third-degree AV block with bradycardia resolved S/P pace maker placement  off the beta-blockers, clonidine.  Monitor patient on telemetry Patient will be restarted on plavix S/p cardiology f/u  6.  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  9.Essential hypertension, continue Norvasc,  discontinued clonidine, Toprol due to bradycardia.   10. COPD: Stable  continue DuoNebs as needed.  8.speech therapy to do swallow study Pured diet and further recommendation by speech therapy  9.  Secondary hyperparathyroidism Continue sevelamer with meals  10.  Anemia of chronic disease Continue EPO shots with hemodialysis treatment  11. Ambulatory Dysfunction and gait instability PT evaluation pending secondary to poor clinical condition  12.  Family updated of medical condition treatment plan  40.  2 diabetes mellitus Diabetic diet with sliding scale coverage with insulin  All the records are reviewed and case discussed with Care Management/Social Worker Management plans discussed with the patient, family and they are in agreement.  CODE STATUS: Full code  TOTAL TIME TAKING CARE OF THIS PATIENT: 35 minutes.   POSSIBLE D/C IN 1-2 DAYS, DEPENDING ON CLINICAL CONDITION.  More than 50% time spent in counseling, coordination of care  Saundra Shelling M.D on 12/05/2018 at 1:13 PM  Between 7am to 6pm - Pager - [phone]  After 6pm go to [email or web address] - password [email or web address] [facility]  Office  [phone]  CC: Primary care physician; [name], MD   Note: This dictation was prepared with Dragon dictation along with smaller phrase technology. Any transcriptional errors that result from this process are unintentional.

## 2018-12-05 NOTE — Progress Notes (Signed)
Pre HD Tx   12/05/18 0904  Vital Signs  Temp 98.8 F (37.1 C)  Temp Source Oral  Pulse Rate 80  Pulse Rate Source Monitor  Resp (!) 22  BP (!) 119/45  BP Location Right Arm  BP Method Automatic  Patient Position (if appropriate) Lying  Oxygen Therapy  SpO2 98 %  O2 Device Room Air  Pain Assessment  Pain Scale PAINAD  Pain Score 0  PAINAD (Pain Assessment in Advanced Dementia)  Breathing 0  Negative Vocalization 0  Facial Expression 0  Body Language 0  Consolability 0  PAINAD Score 0  Dialysis Weight  Weight 81.3 kg  Type of Weight Pre-Dialysis  Time-Out for Hemodialysis  What Procedure? HD   Pt Identifiers(min of two) First/Last Name;MRN/Account#  Correct Site? Yes  Correct Side? Yes  Correct Procedure? Yes  Consents Verified? Yes  Rad Studies Available? N/A  Safety Precautions Reviewed? Yes  Engineer, civil (consulting) Number 5  Station Number 3  UF/Alarm Test Passed  Conductivity: Meter 14  Conductivity: Machine  13.9  pH 7.4  Reverse Osmosis Main  Normal Saline Lot Number [id number]  Dialyzer Lot Number [id number]  Disposable Set Lot Number [id number]  Machine Temperature 98.6 F (37 C)  Musician and Audible Yes  Blood Lines Intact and Secured Yes  Pre Treatment Patient Checks  Vascular access used during treatment Fistula  Hepatitis B Surface Antigen Results Negative  Date Hepatitis B Surface Antigen Drawn 08/21/18  Hepatitis B Surface Antibody  (>10)  Date Hepatitis B Surface Antibody Drawn 08/18/18  Hemodialysis Consent Verified Yes  Hemodialysis Standing Orders Initiated Yes  ECG (Telemetry) Monitor On Yes  Prime Ordered Normal Saline  Length of  DialysisTreatment -hour(s) 3.5 Hour(s)  Dialysis Treatment Comments Na 140  Dialyzer Elisio 17H NR  Dialysate 2K, 2.5 Ca  Dialysis Anticoagulant None  Dialysate Flow Ordered 600  Blood Flow Rate Ordered 400 mL/min  Ultrafiltration Goal 0 Liters  Dialysis Blood Pressure Support Ordered Normal Saline   Education / Care Plan  Dialysis Education Provided No (Comment) (Pt not interactive today. )  Fistula / Graft Left Upper arm Arteriovenous fistula  No Placement Date or Time found.   Placed prior to admission: Yes  Orientation: Left  Access Location: Upper arm  Access Type: Arteriovenous fistula  Site Condition No complications  Fistula / Graft Assessment Present;Thrill;Bruit  Status Patent  Drainage Description None

## 2018-12-05 NOTE — Progress Notes (Signed)
HD Tx completed, ended 30 min short, pt6 hypotensive, but asymptomatic. MD aware.    12/05/18 1214  Vital Signs  Pulse Rate 85  Pulse Rate Source Monitor  Resp (!) 24  BP (!) 88/22  BP Location Right Arm  BP Method Automatic  Patient Position (if appropriate) Lying  During Hemodialysis Assessment  HD Safety Checks Performed Yes  KECN 65.7 KECN  Dialysis Fluid Bolus Normal Saline  Bolus Amount (mL) 250 mL  Intra-Hemodialysis Comments Tx completed (TX shortend for hypotension 30 min lost )

## 2018-12-05 NOTE — Progress Notes (Signed)
[facility]  ROUNDING NOTE   Subjective:   Patient had permanent pacemaker -placed this admission for second-degree heart block that was symptomatic Patient remains lethargic and became hypotensive during dialysis.  Had to be given fluid bolus of 1 L in increments.  Treatment was stopped early.  Patient has a wet sounding cough  Objective:  Vital signs in last 24 hours:  Temp:  [98.8 F (37.1 C)-99.9 F (37.7 C)] 98.8 F (37.1 C) (01/31 0904) Pulse Rate:  [60-104] 87 (01/31 1200) Resp:  [12-30] 29 (01/31 1200) BP: (78-119)/(24-103) 92/41 (01/31 1200) SpO2:  [94 %-100 %] 100 % (01/31 0945) Weight:  [80.9 kg-81.3 kg] 81.3 kg (01/31 0904)  Weight change: -2.278 kg Filed Weights   12/04/18 0344 12/05/18 0432 12/05/18 0904  Weight: 79.7 kg 80.9 kg 81.3 kg    Intake/Output: I/O last 3 completed shifts: In: 110 [I.V.:10; IV Piggyback:100] Out: 300 [Urine:300]   Intake/Output this shift:  No intake/output data recorded.  Physical Exam: General: NAD , chronically ill-appearing  Head:  Moist oral mucosal membranes  Neck: Supple, trachea midline  Lungs:   Limited exam, decreased breath sounds at bases  Heart:  Regular, paced  Abdomen:  Soft, nontender,   Extremities: no peripheral edema.  Neurologic:  Lethargic, did not answer any questions  Skin: No lesions  Access: Left arm AVF,    Basic Metabolic Panel: Recent Labs  Lab 11/28/18 2324 11/25/2018 0509 12/02/18 0413  NA 137 134* 133*  K 3.8 4.3 4.9  CL 99 93* 92*  CO2 31 28 26   GLUCOSE 107* 125* 98  BUN 36* 75* 84*  CREATININE 5.23* 8.96* 10.17*  CALCIUM 9.6 9.6 9.3  PHOS 3.2 4.8* 5.6*    Liver Function Tests: Recent Labs  Lab 11/28/18 2324 11/16/2018 0509 12/02/18 0413  ALBUMIN 3.1* 3.3* 3.2*   No results for input(s): LIPASE, AMYLASE in the last 168 hours. No results for input(s): AMMONIA in the last 168 hours.  CBC: Recent Labs  Lab 11/28/18 2324 11/27/2018 0509 12/03/18 2010  12/04/18 0412 12/05/18 0539  WBC 6.4 8.2 6.3 9.4 11.9*  HGB 8.7* 8.0* 8.2* 8.2* 7.4*  HCT 27.7* 24.9* 25.8* 26.1* 24.1*  MCV 113.1* 109.2* 112.2* 114.0* 113.7*  PLT 136* 164 152 169 165    Cardiac Enzymes: Recent Labs  Lab 12/03/18 1822 12/04/18 0412 12/04/18 1007  TROPONINI 1.37* 1.32* 4.55*    BNP: Invalid input(s): POCBNP  CBG: Recent Labs  Lab 12/04/18 0823 12/04/18 1142 12/04/18 1637 12/04/18 2100 12/05/18 0731  GLUCAP 123* 167* 122* 143* 189*    Microbiology: Results for orders placed or performed during the hospital encounter of 11/10/2018  MRSA PCR Screening     Status: None   Collection Time: 11/25/2018 10:10 AM  Result Value Ref Range Status   MRSA by PCR NEGATIVE NEGATIVE Final    Comment:        The GeneXpert MRSA Assay (FDA approved for NASAL specimens only), is one component of a comprehensive MRSA colonization surveillance program. It is not intended to diagnose MRSA infection nor to guide or monitor treatment for MRSA infections. Performed at [facility], [address] [place], [place] [address]   Surgical PCR screen     Status: None   Collection Time: 12/04/2018  5:13 AM  Result Value Ref Range Status   MRSA, PCR NEGATIVE NEGATIVE Final   Staphylococcus aureus NEGATIVE NEGATIVE Final    Comment: (NOTE) The Xpert SA Assay (FDA approved for NASAL specimens  in patients 51 years of age and older), is one component of a comprehensive surveillance program. It is not intended to diagnose infection nor to guide or monitor treatment. Performed at [facility], [address] [place], [place] [address]     Coagulation Studies: Recent Labs    12/03/18 Feb 08, 2009  LABPROT 13.0  INR 0.99    Urinalysis: Recent Labs    12/04/18 2025  COLORURINE YELLOW*  LABSPEC 1.011  PHURINE 8.0  GLUCOSEU 50*  HGBUR MODERATE*  BILIRUBINUR NEGATIVE  KETONESUR NEGATIVE  PROTEINUR 100*  NITRITE NEGATIVE  LEUKOCYTESUR LARGE*       Imaging: Ct Head Wo Contrast  Result Date: 12/04/2018 CLINICAL DATA:  Altered mental status EXAM: CT HEAD WITHOUT CONTRAST TECHNIQUE: Contiguous axial images were obtained from the base of the skull through the vertex without intravenous contrast. COMPARISON:  05/22/2018 FINDINGS: Brain: There is no mass, hemorrhage or extra-axial collection. The size and configuration of the ventricles and extra-axial CSF spaces are normal. There is hypoattenuation of the white matter, most commonly indicating chronic small vessel disease. Vascular: Atherosclerotic calcification of the vertebral and internal carotid arteries at the skull base. No abnormal hyperdensity of the major intracranial arteries or dural venous sinuses. Skull: The visualized skull base, calvarium and extracranial soft tissues are normal. Sinuses/Orbits: No fluid levels or advanced mucosal thickening of the visualized paranasal sinuses. No mastoid or middle ear effusion. The orbits are normal. IMPRESSION: 1. No acute intracranial abnormality. 2. Findings of chronic small vessel disease. Electronically Signed   By: Ulyses Jarred M.D.   On: 12/04/2018 13:53   Dg Chest Port 1 View  Result Date: 12/04/2018 CLINICAL DATA:  Pneumonia, inpatient EXAM: PORTABLE CHEST 1 VIEW COMPARISON:  December 01, 2018, chest x-ray December 11, 2016 FINDINGS: The heart size and mediastinal contours are stable. The heart size is enlarged. Cardiac pacemaker is unchanged. There is central pulmonary vascular congestion. Consolidation of the right perihilar region is identified unchanged. There is no pleural effusion. The visualized skeletal structures are stable. IMPRESSION: Central pulmonary vascular congestion. Patchy consolidation of the right perihilar region, supra and in postop pneumonia or mass is not excluded. Electronically Signed   By: Abelardo Diesel M.D.   On: 12/04/2018 12:50     Medications:   . sodium chloride Stopped (12/04/18 1830)  . ceFEPime  (MAXIPIME) IV Stopped (12/04/18 1510)   . allopurinol  200 mg Oral QHS  . amLODipine  10 mg Oral Daily  . aspirin EC  325 mg Oral Daily  . clopidogrel  75 mg Oral Daily  . epoetin (EPOGEN/PROCRIT) injection  10,000 Units Intravenous Q M,W,F-HD  . famotidine  20 mg Oral QHS  . feeding supplement (ENSURE ENLIVE)  237 mL Oral BID BM  . ferrous sulfate  325 mg Oral Q breakfast  . furosemide  40 mg Oral Daily  . gabapentin  300 mg Oral QHS  . heparin  5,000 Units Subcutaneous Q8H  . hydrALAZINE  50 mg Oral 2 times per day on Sun Tue Thu Sat  . insulin aspart  0-5 Units Subcutaneous QHS  . insulin aspart  0-9 Units Subcutaneous TID WC  . latanoprost  1 drop Both Eyes QHS  . mirtazapine  15 mg Oral QHS  . multivitamin  1 tablet Oral Daily  . multivitamin with minerals  1 tablet Oral Daily  . pravastatin  40 mg Oral QHS  . sevelamer carbonate  2,400 mg Oral TID WC  . sodium chloride flush  3  mL Intravenous Q12H  . vitamin C  500 mg Oral BID   sodium chloride, acetaminophen, albuterol, amitriptyline, bisacodyl, docusate sodium, guaiFENesin-dextromethorphan, guaiFENesin-dextromethorphan, HYDROcodone-acetaminophen, ipratropium, nitroGLYCERIN, [DISCONTINUED] ondansetron **OR** ondansetron (ZOFRAN) IV, ondansetron (ZOFRAN) IV, senna-docusate, sodium chloride flush  Assessment/ Plan:  [name] is a 78 y.o. black female with end stage renal disease on hemodialysis, coronary artery disease, hypertension, diabetes mellitus type II, GERD, glaucoma, gout, who presents with bradycardia, status post permanent pacemaker December 01, 2018  [place]. Left AVF   1. End Stage Renal Disease: MWF schedule. Hemodialysis today.  Complicated by intradialytic hypotension.  Required close to 1 L fluid bolus.  2. Bradycardia: Permanent pacemaker placed this admission  3. Secondary Hyperparathyroidism:  - sevelamer 3 tablets with meals and 1 with snacks.   4 Anemia of chronic  kidney disease:   - EPO with HD treatment  5. Confusion, Aspiration pneumonia -Work-up in progress.  Non-STEMI.    -Treatment for sepsis with cefepime, vancomycin   LOS: 11 Attie Nawabi 1/31/20201:00 PM

## 2018-12-05 NOTE — Plan of Care (Signed)
  Problem: Skin Integrity: Goal: Risk for impaired skin integrity will decrease Outcome: Progressing   Problem: Activity: Goal: Risk for activity intolerance will decrease Outcome: Not Progressing Note:  Patient is bed bound at this time.

## 2018-12-06 LAB — GLUCOSE, CAPILLARY
GLUCOSE-CAPILLARY: 173 mg/dL — AB (ref 70–99)
Glucose-Capillary: 118 mg/dL — ABNORMAL HIGH (ref 70–99)
Glucose-Capillary: 119 mg/dL — ABNORMAL HIGH (ref 70–99)
Glucose-Capillary: 80 mg/dL (ref 70–99)

## 2018-12-06 LAB — BASIC METABOLIC PANEL
Anion gap: 17 — ABNORMAL HIGH (ref 5–15)
BUN: 41 mg/dL — ABNORMAL HIGH (ref 8–23)
CO2: 23 mmol/L (ref 22–32)
Calcium: 9.9 mg/dL (ref 8.9–10.3)
Chloride: 97 mmol/L — ABNORMAL LOW (ref 98–111)
Creatinine, Ser: 5.85 mg/dL — ABNORMAL HIGH (ref 0.44–1.00)
GFR calc Af Amer: 7 mL/min — ABNORMAL LOW (ref >60)
GFR calc non Af Amer: 6 mL/min — ABNORMAL LOW (ref >60)
Glucose, Bld: 208 mg/dL — ABNORMAL HIGH (ref 70–99)
Potassium: 5.6 mmol/L — ABNORMAL HIGH (ref 3.5–5.1)
SODIUM: 137 mmol/L (ref 135–145)

## 2018-12-06 LAB — CBC
HCT: 23.1 % — ABNORMAL LOW (ref 36.0–46.0)
Hemoglobin: 7.3 g/dL — ABNORMAL LOW (ref 12.0–15.0)
MCH: 35.4 pg — ABNORMAL HIGH (ref 26.0–34.0)
MCHC: 31.6 g/dL (ref 30.0–36.0)
MCV: 112.1 fL — ABNORMAL HIGH (ref 80.0–100.0)
PLATELETS: 188 10*3/uL (ref 150–400)
RBC: 2.06 MIL/uL — ABNORMAL LOW (ref 3.87–5.11)
RDW: 15.1 % (ref 11.5–15.5)
WBC: 12.5 10*3/uL — AB (ref 4.0–10.5)
nRBC: 1.3 % — ABNORMAL HIGH (ref 0.0–0.2)

## 2018-12-06 MED ORDER — ACETAMINOPHEN 650 MG RE SUPP
650.0000 mg | RECTAL | Status: DC | PRN
  Administered 2018-12-06: 650 mg via RECTAL
  Filled 2018-12-06: qty 1

## 2018-12-06 MED ORDER — DEXTROSE 5 % AND 0.45 % NACL IV BOLUS: 500.0000 mL | Freq: Once | INTRAVENOUS | Status: DC

## 2018-12-06 MED ORDER — SODIUM CHLORIDE 0.9% IV SOLUTION: Freq: Once | INTRAVENOUS | Status: DC

## 2018-12-06 MED ORDER — DEXTROSE-NACL 5-0.45 % IV SOLN: INTRAVENOUS | Status: DC

## 2018-12-06 DEATH — deceased

## 2018-12-07 LAB — URINE CULTURE

## 2018-12-10 LAB — CULTURE, BLOOD (ROUTINE X 2)
Culture: NO GROWTH
Culture: NO GROWTH

## 2019-01-04 NOTE — Progress Notes (Signed)
Lost iv site, unable to start new iv. Stat Iv consult placed. Will continue to monitor closely

## 2019-01-04 NOTE — Progress Notes (Signed)
   12/14/18 1900  Clinical Encounter Type  Visited With Family  Visit Type Initial;Code  Referral From Nurse  Consult/Referral To Chaplain  Spiritual Encounters  Spiritual Needs Prayer;Grief support  Chaplain received page for code blue. Chaplain arrived and team was working on patient and family was outside the door and hallway. Patient brother and son. Chaplain maintain a pastoral presence and silent prayer. The care team worked really hard for the patient and unfortunately patient expired. Chaplain went out with doctor to tell the family and offered support. Chaplain went in with family to see patient after care team clean patient up. Chaplain maintain pastoral presence and prayer for strength and peace.Chaplain told family if there was anything else she could do please have her page. Family thank Chaplain for being here for them.

## 2019-01-04 NOTE — Progress Notes (Signed)
Spoke with dr. Jerelyn Charles to make aware patients blood pressure is 89/36 map 50 Per. Give d51/2ns 500cc bolus then continue at 115ml/hr

## 2019-01-04 NOTE — Discharge Summary (Signed)
[place] [facility] [place] NAME: Laurie Steele    MR#:  [id number]  DATE OF BIRTH:  [birth year]  DATE OF ADMISSION:  11/19/2018 ADMITTING PHYSICIAN: Demetrios Loll, MD  DATE OF DISCHARGE: 12-15-18  8:11 PM  PRIMARY CARE PHYSICIAN: [name], MD    ADMISSION DIAGNOSIS:  Bradycardia [R00.1]  DISCHARGE DIAGNOSIS:  Death  SECONDARY DIAGNOSIS:   Past Medical History:  Diagnosis Date  . Anxiety   . CAD (coronary artery disease)   . Chronic anemia    Dr Inez Pilgrim  . Dysrhythmia   . GERD (gastroesophageal reflux disease)   . Glaucoma   . Gout   . Hx of colonic polyps   . Hyperlipidemia   . Hypertension   . Lower back pain   . Miscarriage   . Myocardial infarction ([place])   . 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 Neuropathy   . 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 Peritoneal dialysis status ([place])   . Renal failure    Dr Holley Raring  . Renal insufficiency   . 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 Vaginal delivery    x 4    HOSPITAL COURSE:   Death note Patient developed acute cardiac arrest, after unsuccessful resuscitation attempt, patient was pronounced dead at Feb 03, 1903, patient without spontaneous respirations/pulse/cranial nerve reflexes/response to pain  Hospital course 78 year old female patient with essential hypertension, ESRD on hemodialysis Monday, Wednesday, February 03, 2023 brought in because of significant weakness.  *Acute HCAP  *Acute toxic metabolic encephalopathy   *Acute sepsis   *Acute symptomatic third-degree AV block/bradycardia  *Acute atypical chest pain with elevated troponins  *ESRD on hemodialysis Monday, Wednesday, 03-Feb-2023  *History of CAD, EF 45%, has history of anteroseptal hypokinesia  *History of gout  *Essential hypertension  *COPD without exacerbation  *Secondary  hyperparathyroidism  *Anemia of chronic disease  *Chronic diabetes mellitus type 2   DISCHARGE CONDITIONS:   Death  CONSULTS OBTAINED:  Treatment Team:  Lavonia Dana, MD Corey Skains, MD Isaias Cowman, MD Teodoro Spray, MD Catarina Hartshorn, MD Alexis Goodell, MD Jonathon Bellows, MD  DRUG ALLERGIES:   Allergies  Allergen Reactions  . Alprazolam Other (See Comments)    Unable to sleep and confusion    DISCHARGE MEDICATIONS:   Allergies as of 15-Dec-2018      Reactions   Alprazolam Other (See Comments)   Unable to sleep and confusion      Medication List    STOP taking these medications   cloNIDine 0.1 MG tablet Commonly known as:  CATAPRES   famotidine 20 MG tablet Commonly known as:  PEPCID   ipratropium-albuterol 0.5-2.5 (3) MG/3ML Soln Commonly known as:  DUONEB   metoprolol succinate 25 MG 24 hr tablet Commonly known as:  TOPROL-XL     TAKE these medications   acetaminophen 650 MG CR tablet Commonly known as:  TYLENOL Take 650 mg by mouth every 8 (eight) hours as needed for pain.   allopurinol 100 MG tablet Commonly known as:  ZYLOPRIM Take 200 mg by mouth at bedtime.   amitriptyline 25 MG tablet Commonly known as:  ELAVIL Take 25 mg by mouth at bedtime as needed. for sleep   amLODipine 10 MG tablet Commonly known as:  NORVASC Take 10 mg by mouth daily.   ASPERCREME W/LIDOCAINE 4 % Crea Generic drug:  Lidocaine HCl  Apply topically as needed   aspirin 325 MG tablet Take 325 mg by mouth daily.   brimonidine 0.2 % ophthalmic solution Commonly known as:  ALPHAGAN Apply to eye.   cephALEXin 250 MG capsule Commonly known as:  KEFLEX Take 1 capsule (250 mg total) by mouth every 6 (six) hours for 7 days.   clopidogrel 75 MG tablet Commonly known as:  PLAVIX Take 1 tablet (75 mg total) by mouth daily.   colchicine 0.6 MG tablet Take 0.5 tablets (0.3 mg total) by mouth 2 (two) times a week.   DERMACLOUD Crea Apply  topically.   docusate sodium 100 MG capsule Commonly known as:  COLACE Take 1 capsule (100 mg total) by mouth daily as needed for mild constipation.   dorzolamide-timolol 22.3-6.8 MG/ML ophthalmic solution Commonly known as:  COSOPT INSTILL 1 DROP IN LEFT EYE TWICE A DAY   feeding supplement (ENSURE ENLIVE) Liqd Take 237 mLs by mouth 2 (two) times daily between meals.   ferrous sulfate 325 (65 FE) MG tablet Take 1 tablet (325 mg total) by mouth daily with breakfast.   fluticasone 50 MCG/ACT nasal spray Commonly known as:  FLONASE Place into the nose.   furosemide 40 MG tablet Commonly known as:  LASIX Take 1 tablet (40 mg total) by mouth daily.   gabapentin 300 MG capsule Commonly known as:  NEURONTIN Take 300 mg by mouth 3 (three) times daily.   glimepiride 1 MG tablet Commonly known as:  AMARYL Take 1 mg by mouth daily with breakfast.   glipiZIDE 5 MG tablet Commonly known as:  GLUCOTROL Take 5 mg by mouth daily.   hydrALAZINE 50 MG tablet Commonly known as:  APRESOLINE Take 50 mg by mouth 2 (two) times daily. ONLY ON Tuesday,THURSDAY,SATURDAY ,AND SUNDAY   insulin aspart 100 UNIT/ML injection Commonly known as:  novoLOG Inject 0-5 Units into the skin at bedtime. CBG < 70: implement hypoglycemia protocol  CBG 70 - 120: 0 units  CBG 121 - 150: 2 units  CBG 151 - 200: 3 units  CBG 201 - 250: 5 units  CBG 251 - 300: 8 units  CBG 301 - 350: 11 units  CBG 351 - 400: 15 units   ipratropium 0.03 % nasal spray Commonly known as:  ATROVENT Place 1 spray into both nostrils 3 (three) times daily.   latanoprost 0.005 % ophthalmic solution Commonly known as:  XALATAN Place 1 drop into both eyes at bedtime.   mirtazapine 15 MG tablet Commonly known as:  REMERON Take 15 mg by mouth at bedtime.   multivitamin Tabs tablet Take 1 tablet by mouth daily.   oxyCODONE-acetaminophen 5-325 MG tablet Commonly known as:  PERCOCET Take 1 tablet by mouth every 4 (four) hours  as needed for severe pain.   pantoprazole 40 MG tablet Commonly known as:  PROTONIX Take 1 tablet (40 mg total) by mouth daily.   pravastatin 40 MG tablet Commonly known as:  PRAVACHOL Take 40 mg by mouth at bedtime.   sevelamer carbonate 800 MG tablet Commonly known as:  RENVELA Take 800 mg by mouth 3 (three) times daily with meals.   traMADol 50 MG tablet Commonly known as:  ULTRAM Take by mouth every 6 (six) hours as needed.        DISCHARGE INSTRUCTIONS:  If you experience worsening of your admission symptoms, develop shortness of breath, life threatening emergency, suicidal or homicidal thoughts you must seek medical attention immediately by calling 911 or calling your MD  immediately  if symptoms less severe.  You Must read complete instructions/literature along with all the possible adverse reactions/side effects for all the Medicines you take and that have been prescribed to you. Take any new Medicines after you have completely understood and accept all the possible adverse reactions/side effects.   Please note  You were cared for by a hospitalist during your hospital stay. If you have any questions about your discharge medications or the care you received while you were in the hospital after you are discharged, you can call the unit and asked to speak with the hospitalist on call if the hospitalist that took care of you is not available. Once you are discharged, your primary care physician will handle any further medical issues. Please note that NO REFILLS for any discharge medications will be authorized once you are discharged, as it is imperative that you return to your primary care physician (or establish a relationship with a primary care physician if you do not have one) for your aftercare needs so that they can reassess your need for medications and monitor your lab values.    Today   CHIEF COMPLAINT:   Chief Complaint  Patient presents with  . Bradycardia  .  Weakness    HISTORY OF PRESENT ILLNESS:  77 y.o. female with a known history of multiple medical problems including CAD, chronic anemia, gout, hypertension, hyperlipidemia, MI, ESRD on hemodialysis, diabetes, GERD and etc.  The patient feels dizzy and weak from time to time for long time but worsening during dialysis today.  She was found bradycardia and sent to ED for further evaluation.  She is found severe bradycardia from 30 to 50s.  ED physician request cardiology consult and admission.  The patient denies any other symptoms.  VITAL SIGNS:  Blood pressure (!) 89/36, pulse 60, temperature 99.4 F (37.4 C), temperature source Axillary, resp. rate 17, height 5\' 7"  (1.702 m), weight 80.3 kg, SpO2 100 %.  I/O:  No intake or output data in the 24 hours ending 12/07/18 1229  PHYSICAL EXAMINATION:  GENERAL:  78 y.o.-year-old patient lying in the bed with no acute distress.  EYES: Pupils equal, round, reactive to light and accommodation. No scleral icterus. Extraocular muscles intact.  HEENT: Head atraumatic, normocephalic. Oropharynx and nasopharynx clear.  NECK:  Supple, no jugular venous distention. No thyroid enlargement, no tenderness.  LUNGS: Normal breath sounds bilaterally, no wheezing, rales,rhonchi or crepitation. No use of accessory muscles of respiration.  CARDIOVASCULAR: S1, S2 normal. No murmurs, rubs, or gallops.  ABDOMEN: Soft, non-tender, non-distended. Bowel sounds present. No organomegaly or mass.  EXTREMITIES: No pedal edema, cyanosis, or clubbing.  NEUROLOGIC: Cranial nerves II through XII are intact. Muscle strength 5/5 in all extremities. Sensation intact. Gait not checked.  PSYCHIATRIC: The patient is alert and oriented x 3.  SKIN: No obvious rash, lesion, or ulcer.   DATA REVIEW:   CBC Recent Labs  Lab 2018/12/27 0539  WBC 12.5*  HGB 7.3*  HCT 23.1*  PLT 188    Chemistries  Recent Labs  Lab 12/05/18 1533 27-Dec-2018 0539  NA  --  137  K  --  5.6*  CL  --   97*  CO2  --  23  GLUCOSE  --  208*  BUN  --  41*  CREATININE  --  5.85*  CALCIUM  --  9.9  AST 160*  --   ALT 21  --   ALKPHOS 88  --   BILITOT 0.8  --  Cardiac Enzymes Recent Labs  Lab 12/04/18 1007  TROPONINI 4.55*    Microbiology Results  Results for orders placed or performed during the hospital encounter of 11/12/2018  MRSA PCR Screening     Status: None   Collection Time: 11/14/2018 10:10 AM  Result Value Ref Range Status   MRSA by PCR NEGATIVE NEGATIVE Final    Comment:        The GeneXpert MRSA Assay (FDA approved for NASAL specimens only), is one component of a comprehensive MRSA colonization surveillance program. It is not intended to diagnose MRSA infection nor to guide or monitor treatment for MRSA infections. Performed at [facility], [address] [place], [place] [address]   Surgical PCR screen     Status: None   Collection Time: 11/13/2018  5:13 AM  Result Value Ref Range Status   MRSA, PCR NEGATIVE NEGATIVE Final   Staphylococcus aureus NEGATIVE NEGATIVE Final    Comment: (NOTE) The Xpert SA Assay (FDA approved for NASAL specimens in patients 84 years of age and older), is one component of a comprehensive surveillance program. It is not intended to diagnose infection nor to guide or monitor treatment. Performed at [facility], [address], [place], [place] [address]   Urine Culture     Status: Abnormal   Collection Time: 12/04/18  8:25 PM  Result Value Ref Range Status   Specimen Description   Final    URINE, RANDOM Performed at [facility], [address] [place], [place] [address]    Special Requests   Final    NONE Performed at [facility], [address] [place], [place] [address]    Culture MULTIPLE SPECIES PRESENT, SUGGEST RECOLLECTION (A)  Final   Report Status 12/07/2018 FINAL  Final  CULTURE, BLOOD (ROUTINE X 2) w Reflex to ID Panel     Status: None (Preliminary result)    Collection Time: 12/05/18  2:39 PM  Result Value Ref Range Status   Specimen Description BLOOD RIGHT HAND  Final   Special Requests   Final    BOTTLES DRAWN AEROBIC AND ANAEROBIC Blood Culture results may not be optimal due to an inadequate volume of blood received in culture bottles   Culture   Final    NO GROWTH 2 DAYS Performed at [facility], [address] [place], [place] [address]    Report Status PENDING  Incomplete  CULTURE, BLOOD (ROUTINE X 2) w Reflex to ID Panel     Status: None (Preliminary result)   Collection Time: 12/05/18  4:04 PM  Result Value Ref Range Status   Specimen Description BLOOD RT ARM  Final   Special Requests   Final    BOTTLES DRAWN AEROBIC AND ANAEROBIC Blood Culture results may not be optimal due to an inadequate volume of blood received in culture bottles   Culture   Final    NO GROWTH 2 DAYS Performed at [facility], [address], [place],  [address]    Report Status PENDING  Incomplete    RADIOLOGY:  No results found.  EKG:   Orders placed or performed during the hospital encounter of 11/30/2018  . EKG 12-Lead  . EKG 12-Lead  . EKG 12-Lead  . EKG 12-Lead  . EKG 12-Lead  . EKG 12-Lead  . EKG 12-Lead  . EKG 12-Lead  . EKG 12-Lead in am (before 8am)  . EKG 12-Lead in am (before 8am)  . EKG 12-Lead  . EKG  12-Lead      Management plans discussed with the patient, family and they are in agreement.  CODE STATUS:  Code Status History    Date Active Date Inactive Code Status Order ID Comments User Context   11/12/2018 2025 01/01/19 2316 Full Code [id number]  Demetrios Loll, MD Inpatient   04/08/2018 1019 04/09/2018 2156 Full Code [id number]  Bettey Costa, MD Inpatient   02/28/2018 0126 03/04/2018 1856 Full Code [id number]  Arta Silence, MD Inpatient   01/22/2017 1902 01/26/2017 2158 Full Code [id number]  Hillary Bow, MD ED   12/11/2016 1344 12/14/2016 1705 Full Code [id number]  Gladstone Lighter, MD Inpatient    07/03/2016 2015 07/05/2016 1943 Full Code [id number]  Vaughan Basta, MD Inpatient   06/15/2016 1915 06/18/2016 2026 Full Code [id number]  Hugelmeyer, [place], DO Inpatient   05/21/2016 1628 05/28/2016 1811 Full Code [id number]  Henreitta Leber, MD Inpatient   04/05/2016 1614 04/06/2016 2107 Full Code [id number]  Loletha Grayer, MD ED   03/14/2016 2350 03/16/2016 1534 Full Code [id number]  Max Sane, MD Inpatient      TOTAL TIME TAKING CARE OF THIS PATIENT: 35 minutes.    Avel Peace Tamella Tuccillo M.D on 12/07/2018 at 12:29 PM  Between 7am to 6pm - Pager - [phone]  After 6pm go to [email or web address] - password [email or web address] [facility]  Office  [phone]  CC: Primary care physician; [name], MD   Note: This dictation was prepared with Dragon dictation along with smaller phrase technology. Any transcriptional errors that result from this process are unintentional.

## 2019-01-04 NOTE — Progress Notes (Signed)
Family Meeting Note  Advance Directive:yes  Today a meeting took place with the Patient and Patient sister, patient son Delfino Lovett.  Patient is unable to participate due TI:WPYKDX capacity Encephalopathy   The following clinical team members were present during this meeting:MD  The following were discussed:Patient's diagnosis: Multiorgan system failure, Patient's progosis: Unable to determine and Goals for treatment: Full Code  Additional follow-up to be provided: prn  Time spent during discussion:20 minutes  [place] Harms, MD

## 2019-01-04 NOTE — Progress Notes (Addendum)
Chart reviewed. Pt visited. Pt is lethargic and has congested cough. Not able to particpate woth swallow eval at this time. Lengthy discussion with grandaughter. Will alter liquids to honey thick liquids in the event that Pt becomes more alert. HOWEVER, rec. no PO's until more alert. May need to alter meds. to IV form. ST to follow up 2-3 days in hopes of overall improvement for assessment. Sign posted above bed regarding nothing by mouth unless fully alert.

## 2019-01-04 NOTE — Progress Notes (Signed)
PT Cancellation Note  Patient Details Name: Laurie Steele MRN: [id number] DOB: September 04, 1941   Cancelled Treatment:    Reason Eval/Treat Not Completed: Patient not medically ready   Chart reviewed.  Due to lab values and therapy guidelines will hold session at this time and continue as appropriate. Potassium 5.6  HgB 7.3 HCT 23.1   Chesley Noon 2019/01/03, 8:05 AM

## 2019-01-04 NOTE — Progress Notes (Signed)
Paged dr. Jerelyn Charles to make aware axillary temp is 100.3. patient unable to tolerate PO. Need rectal and if MD wants to do blood cultures

## 2019-01-04 NOTE — ED Provider Notes (Signed)
[place]  Department of Emergency Medicine   Code Blue CONSULT NOTE  Chief Complaint: Cardiac arrest/unresponsive   Level V Caveat: Unresponsive  History of present illness: I was contacted by the hospital for a CODE BLUE cardiac arrest upstairs and presented to the patient's bedside.    ROS: Unable to obtain, Level V caveat  Scheduled Meds: . sodium chloride   Intravenous Once  . allopurinol  200 mg Oral QHS  . amLODipine  10 mg Oral Daily  . aspirin EC  325 mg Oral Daily  . clopidogrel  75 mg Oral Daily  . epoetin (EPOGEN/PROCRIT) injection  10,000 Units Intravenous Q M,W,F-HD  . famotidine  20 mg Oral QHS  . feeding supplement (ENSURE ENLIVE)  237 mL Oral BID BM  . ferrous sulfate  325 mg Oral Q breakfast  . furosemide  40 mg Oral Daily  . gabapentin  300 mg Oral QHS  . heparin  5,000 Units Subcutaneous Q8H  . hydrALAZINE  50 mg Oral 2 times per day on Sun Tue Thu Sat  . insulin aspart  0-5 Units Subcutaneous QHS  . insulin aspart  0-9 Units Subcutaneous TID WC  . latanoprost  1 drop Both Eyes QHS  . mirtazapine  15 mg Oral QHS  . multivitamin  1 tablet Oral Daily  . multivitamin with minerals  1 tablet Oral Daily  . pravastatin  40 mg Oral QHS  . sodium chloride flush  3 mL Intravenous Q12H  . vitamin C  500 mg Oral BID   Continuous Infusions: . sodium chloride Stopped (12/04/18 1830)  . ceFEPime (MAXIPIME) IV 1 g (12/05/18 1804)  . dextrose 5 % and 0.45% NaCl    . dextrose 5 % and 0.45% NaCl    . [START ON 12/08/2018] vancomycin     PRN Meds:.sodium chloride, acetaminophen, acetaminophen, albuterol, amitriptyline, bisacodyl, docusate sodium, guaiFENesin-dextromethorphan, guaiFENesin-dextromethorphan, HYDROcodone-acetaminophen, ipratropium, nitroGLYCERIN, [DISCONTINUED] ondansetron **OR** ondansetron (ZOFRAN) IV, ondansetron (ZOFRAN) IV, senna-docusate, sodium chloride flush Past Medical History:  Diagnosis Date  . Anxiety   . CAD (coronary artery  disease)   . Chronic anemia    Dr Inez Pilgrim  . Dysrhythmia   . GERD (gastroesophageal reflux disease)   . Glaucoma   . Gout   . Hx of colonic polyps   . Hyperlipidemia   . Hypertension   . Lower back pain   . Miscarriage   . Myocardial infarction ([place])   . 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 Neuropathy   . 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 Peritoneal dialysis status ([place])   . Renal failure    Dr Holley Raring  . Renal insufficiency   . 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 Vaginal delivery    x 4   Past Surgical History:  Procedure Laterality Date  . ABDOMINAL HYSTERECTOMY  1983  . AV FISTULA PLACEMENT Left 09/21/2016   Procedure: ARTERIOVENOUS (AV) FISTULA CREATION ( BRACHIOCEPHALIC );  Surgeon: Katha Cabal, MD;  Location: [facility] ORS;  Service: Vascular;  Laterality: Left;  . BACK SURGERY  6/08   Dr Collier Salina  . BACK SURGERY  1987   disc  . CHOLECYSTECTOMY    . DIALYSIS/PERMA CATHETER REMOVAL N/A 02/25/2017   Procedure: Dialysis/Perma Catheter Removal;  Surgeon: Algernon Huxley, MD;  Location: [place] [facility] LAB;  Service: Cardiovascular;  Laterality: N/A;  .  INSERTION OF DIALYSIS CATHETER  2017  . PACEMAKER INSERTION Right 11/29/2018   Procedure: INSERTION PACEMAKER;  Surgeon: Isaias Cowman, MD;  Location: [facility] ORS;  Service: Cardiovascular;  Laterality: Right;  . PERITONEAL CATHETER INSERTION    . REMOVAL OF A DIALYSIS CATHETER N/A 09/21/2016   Procedure: REMOVAL OF A DIALYSIS CATHETER ( PERITONEAL DIALYSIS CATH );  Surgeon: Katha Cabal, MD;  Location: [facility] ORS;  Service: Vascular;  Laterality: N/A;  . VITRECTOMY AND CATARACT Left 11/04/2017   Procedure: VITRECTOMY AND CATARACT;  Surgeon: Leandrew Koyanagi, MD;  Location: [facility] ORS;  Service: Ophthalmology;  Laterality: Left;  US00:51.6 AP%21.8 ONG29.52 FLUID LOT #[id number] H   Social History    Socioeconomic History  . Marital status: Widowed    Spouse name: Not on file  . Number of children: 4  . Years of education: Not on file  . Highest education level: Not on file  Occupational History  . Occupation: retired    Fish farm manager: RETIRED    Comment: [employer]  Social Needs  . Financial resource strain: Not on file  . Food insecurity:    Worry: Not on file    Inability: Not on file  . Transportation needs:    Medical: Not on file    Non-medical: Not on file  Tobacco Use  . Smoking status: Former Smoker    Last attempt to quit: 11/05/1990    Years since quitting: 28.1  . Smokeless tobacco: Never Used  Substance and Sexual Activity  . Alcohol use: No  . Drug use: No  . Sexual activity: Not Currently  Lifestyle  . Physical activity:    Days per week: Not on file    Minutes per session: Not on file  . Stress: Not on file  Relationships  . Social connections:    Talks on phone: Not on file    Gets together: Not on file    Attends religious service: Not on file    Active member of club or organization: Not on file    Attends meetings of clubs or organizations: Not on file    Relationship status: Not on file  . Intimate partner violence:    Fear of current or ex partner: Not on file    Emotionally abused: Not on file    Physically abused: Not on file    Forced sexual activity: Not on file  Other Topics Concern  . Not on file  Social History Narrative   Lives at home with son. Has a walker. Dependent for dome daily activities   Allergies  Allergen Reactions  . Alprazolam Other (See Comments)    Unable to sleep and confusion    Last set of Vital Signs (not current) Vitals:   Dec 28, 2018 1520 12/28/18 1717  BP:  (!) 89/36  Pulse:  60  Resp:  17  Temp: 100.3 F (37.9 C) 99.4 F (37.4 C)  SpO2:  100%     Physical Exam  Gen: unresponsive Cardiovascular: pulseless  Resp: apneic. Breath sounds equal bilaterally with bagging  Abd: nondistended   Neuro: GCS 3, unresponsive to pain  HEENT: Blood is noted in the posterior pharynx, gag reflex absent  Neck: No crepitus  Musculoskeletal: No deformity  Skin: warm  Procedures  INTUBATION Performed by: Laurence Aly Required items: required blood products, implants, devices, and special equipment available Patient identity confirmed: provided demographic data and hospital-assigned identification number Time out: Immediately prior to procedure a "time out" was called to verify the correct  patient, procedure, equipment, support staff and site/side marked as required. Indications: Cardiopulmonary arrest Intubation method: Glide scope Preoxygenation: 100% BVM Sedatives: None Paralytic: None Tube Size: 7.5 cuffed Post-procedure assessment: chest rise and ETCO2 monitor Breath sounds: equal and absent over the epigastrium Tube secured by Respiratory Therapy Patient tolerated the procedure well with no immediate complications.  CRITICAL CARE Performed by: Laurence Aly Total critical care time: 30 Critical care time was exclusive of separately billable procedures and treating other patients. Critical care was necessary to treat or prevent imminent or life-threatening deterioration. Critical care was time spent personally by me on the following activities: development of treatment plan with patient and/or surrogate as well as nursing, discussions with consultants, evaluation of patient's response to treatment, examination of patient, obtaining history from patient or surrogate, ordering and performing treatments and interventions, ordering and review of laboratory studies, ordering and review of radiographic studies, pulse oximetry and re-evaluation of patient's condition.  Cardiopulmonary Resuscitation (CPR) Procedure Note Directed/Performed by: Laurence Aly I personally directed ancillary staff and/or performed CPR in an effort to regain return of spontaneous  circulation and to maintain cardiac, neuro and systemic perfusion.    Medical Decision making  Cardiopulmonary arrest, renal failure, electrolyte abnormality, sepsis, anemia, MI  Assessment and Plan  Cardiopulmonary arrest  I presented to the bedside after cardiopulmonary arrest.  Patient underwent extensive standard ACLS protocols.  Multiple rounds of epinephrine with occasional atropine were given.  Patient was given multiple doses of calcium as well as sodium bicarb.  She was also given amiodarone 300 mg IV when she was noted to go into V. tach and V. fib.  She was subsequently shocked numerous times with only transient but none lasting return of spontaneous circulation.  After an extensive work-up, time of death was called.  Patient alternated between PEA rhythm and asystole.   Earleen Newport, MD December 16, 2018 2008

## 2019-01-04 NOTE — Progress Notes (Signed)
[facility]  ROUNDING NOTE   Subjective:   Patient had permanent pacemaker -placed this admission for second-degree heart block that was symptomatic Patient remains lethargic  Did not tolerate HD on friday  Objective:  Vital signs in last 24 hours:  Temp:  [97.6 F (36.4 C)-101.2 F (38.4 C)] 101.2 F (38.4 C) (02/01 0839) Pulse Rate:  [80-95] 95 (02/01 0839) Resp:  [16-19] 16 (02/01 0417) BP: (96-125)/(47-62) 125/56 (02/01 0839) SpO2:  [95 %-100 %] 100 % (02/01 1152) Weight:  [80.3 kg] 80.3 kg (02/01 0417)  Weight change: 0.398 kg Filed Weights   12/05/18 0904 12/05/18 1215 11-Dec-2018 0417  Weight: 81.3 kg 81.8 kg 80.3 kg    Intake/Output: I/O last 3 completed shifts: In: -  Out: -752 [Urine:100]   Intake/Output this shift:  No intake/output data recorded.  Physical Exam: General: NAD , chronically ill-appearing  Head:  Moist oral mucosal membranes  Neck: Supple, trachea midline  Lungs:   Limited exam, coarse breath sounds b/l, tachypneic  Heart:  Regular, paced  Abdomen:  Soft, nontender,   Extremities: no peripheral edema.  Neurologic:  Lethargic, did not answer any questions  Skin: No lesions  Access: Left arm AVF,    Basic Metabolic Panel: Recent Labs  Lab 11/17/2018 0509 12/02/18 0413 12-11-18 0539  NA 134* 133* 137  K 4.3 4.9 5.6*  CL 93* 92* 97*  CO2 28 26 23   GLUCOSE 125* 98 208*  BUN 75* 84* 41*  CREATININE 8.96* 10.17* 5.85*  CALCIUM 9.6 9.3 9.9  PHOS 4.8* 5.6*  --     Liver Function Tests: Recent Labs  Lab 11/10/2018 0509 12/02/18 0413 12/05/18 1533  AST  --   --  160*  ALT  --   --  21  ALKPHOS  --   --  88  BILITOT  --   --  0.8  PROT  --   --  6.9  ALBUMIN 3.3* 3.2* 3.1*   No results for input(s): LIPASE, AMYLASE in the last 168 hours. No results for input(s): AMMONIA in the last 168 hours.  CBC: Recent Labs  Lab 11/12/2018 0509 12/03/18 2010 12/04/18 0412 12/05/18 0539 12/11/2018 0539  WBC 8.2 6.3 9.4 11.9*  12.5*  HGB 8.0* 8.2* 8.2* 7.4* 7.3*  HCT 24.9* 25.8* 26.1* 24.1* 23.1*  MCV 109.2* 112.2* 114.0* 113.7* 112.1*  PLT 164 152 169 165 188    Cardiac Enzymes: Recent Labs  Lab 12/03/18 1822 12/04/18 0412 12/04/18 1007  TROPONINI 1.37* 1.32* 4.55*    BNP: Invalid input(s): POCBNP  CBG: Recent Labs  Lab 12/05/18 1315 12/05/18 1644 12/05/18 2120 11-Dec-2018 0839 12-11-18 1205  GLUCAP 139* 145* 165* 173* 118*    Microbiology: Results for orders placed or performed during the hospital encounter of 12/02/2018  MRSA PCR Screening     Status: None   Collection Time: 11/16/2018 10:10 AM  Result Value Ref Range Status   MRSA by PCR NEGATIVE NEGATIVE Final    Comment:        The GeneXpert MRSA Assay (FDA approved for NASAL specimens only), is one component of a comprehensive MRSA colonization surveillance program. It is not intended to diagnose MRSA infection nor to guide or monitor treatment for MRSA infections. Performed at [facility], [address] [place], [place] [address]   Surgical PCR screen     Status: None   Collection Time: 11/20/2018  5:13 AM  Result Value Ref Range Status   MRSA, PCR NEGATIVE NEGATIVE  Final   Staphylococcus aureus NEGATIVE NEGATIVE Final    Comment: (NOTE) The Xpert SA Assay (FDA approved for NASAL specimens in patients 37 years of age and older), is one component of a comprehensive surveillance program. It is not intended to diagnose infection nor to guide or monitor treatment. Performed at [facility], [address] [place], [place] [address]   CULTURE, BLOOD (ROUTINE X 2) w Reflex to ID Panel     Status: None (Preliminary result)   Collection Time: 12/05/18  2:39 PM  Result Value Ref Range Status   Specimen Description BLOOD RIGHT HAND  Final   Special Requests   Final    BOTTLES DRAWN AEROBIC AND ANAEROBIC Blood Culture results may not be optimal due to an inadequate volume of blood received in culture bottles    Culture   Final    NO GROWTH < 24 HOURS Performed at [facility], [address] [place], [place] [address]    Report Status PENDING  Incomplete  CULTURE, BLOOD (ROUTINE X 2) w Reflex to ID Panel     Status: None (Preliminary result)   Collection Time: 12/05/18  4:04 PM  Result Value Ref Range Status   Specimen Description BLOOD RT ARM  Final   Special Requests   Final    BOTTLES DRAWN AEROBIC AND ANAEROBIC Blood Culture results may not be optimal due to an inadequate volume of blood received in culture bottles   Culture   Final    NO GROWTH < 24 HOURS Performed at [facility], [address], [place], [place] [address]    Report Status PENDING  Incomplete    Coagulation Studies: Recent Labs    12/03/18 2009-02-06  LABPROT 13.0  INR 0.99    Urinalysis: Recent Labs    12/04/18 2025  COLORURINE YELLOW*  LABSPEC 1.011  PHURINE 8.0  GLUCOSEU 50*  HGBUR MODERATE*  BILIRUBINUR NEGATIVE  KETONESUR NEGATIVE  PROTEINUR 100*  NITRITE NEGATIVE  LEUKOCYTESUR LARGE*      Imaging: Ct Head Wo Contrast  Result Date: 12/04/2018 CLINICAL DATA:  Altered mental status EXAM: CT HEAD WITHOUT CONTRAST TECHNIQUE: Contiguous axial images were obtained from the base of the skull through the vertex without intravenous contrast. COMPARISON:  05/22/2018 FINDINGS: Brain: There is no mass, hemorrhage or extra-axial collection. The size and configuration of the ventricles and extra-axial CSF spaces are normal. There is hypoattenuation of the white matter, most commonly indicating chronic small vessel disease. Vascular: Atherosclerotic calcification of the vertebral and internal carotid arteries at the skull base. No abnormal hyperdensity of the major intracranial arteries or dural venous sinuses. Skull: The visualized skull base, calvarium and extracranial soft tissues are normal. Sinuses/Orbits: No fluid levels or advanced mucosal thickening of the visualized paranasal sinuses. No  mastoid or middle ear effusion. The orbits are normal. IMPRESSION: 1. No acute intracranial abnormality. 2. Findings of chronic small vessel disease. Electronically Signed   By: Ulyses Jarred M.D.   On: 12/04/2018 13:53     Medications:   . sodium chloride Stopped (12/04/18 1830)  . ceFEPime (MAXIPIME) IV 1 g (12/05/18 1804)  . [START ON 12/08/2018] vancomycin     . allopurinol  200 mg Oral QHS  . amLODipine  10 mg Oral Daily  . aspirin EC  325 mg Oral Daily  . clopidogrel  75 mg Oral Daily  . epoetin (EPOGEN/PROCRIT) injection  10,000 Units Intravenous Q M,W,F-HD  . famotidine  20 mg Oral QHS  . feeding  supplement (ENSURE ENLIVE)  237 mL Oral BID BM  . ferrous sulfate  325 mg Oral Q breakfast  . furosemide  40 mg Oral Daily  . gabapentin  300 mg Oral QHS  . heparin  5,000 Units Subcutaneous Q8H  . hydrALAZINE  50 mg Oral 2 times per day on Sun Tue Thu Sat  . insulin aspart  0-5 Units Subcutaneous QHS  . insulin aspart  0-9 Units Subcutaneous TID WC  . latanoprost  1 drop Both Eyes QHS  . mirtazapine  15 mg Oral QHS  . multivitamin  1 tablet Oral Daily  . multivitamin with minerals  1 tablet Oral Daily  . pravastatin  40 mg Oral QHS  . sevelamer carbonate  2,400 mg Oral TID WC  . sodium chloride flush  3 mL Intravenous Q12H  . vitamin C  500 mg Oral BID   sodium chloride, acetaminophen, albuterol, amitriptyline, bisacodyl, docusate sodium, guaiFENesin-dextromethorphan, guaiFENesin-dextromethorphan, HYDROcodone-acetaminophen, ipratropium, nitroGLYCERIN, [DISCONTINUED] ondansetron **OR** ondansetron (ZOFRAN) IV, ondansetron (ZOFRAN) IV, senna-docusate, sodium chloride flush  Assessment/ Plan:  Laurie Steele is a 78 y.o. black female with end stage renal disease on hemodialysis, coronary artery disease, hypertension, diabetes mellitus type II, GERD, glaucoma, gout, who presents with bradycardia, status post permanent pacemaker December 01, 2018  [place] MWF [place].  Left AVF   1. End Stage Renal Disease: MWF schedule. Hemodialysis complicated by intradialytic hypotension.   2. Bradycardia: Permanent pacemaker placed this admission  3. Secondary Hyperparathyroidism:  - sevelamer 3 tablets with meals and 1 with snacks when able to eat normal diet  4 Anemia of chronic kidney disease:   - EPO with HD treatment  5. Confusion, Aspiration pneumonia -Work-up in progress.  Non-STEMI.    -Treatment for sepsis with cefepime, vancomycin   LOS: 12 Laurie Steele 12/31/2010:54 PM

## 2019-01-04 NOTE — Plan of Care (Signed)
  Problem: Clinical Measurements: Goal: Respiratory complications will improve Outcome: Not Progressing Note:  Patient continues to be tachypneic in setting of pneumonia.

## 2019-01-04 NOTE — Progress Notes (Signed)
Notified by ccmd patient went asystole then vtac/vfib. Upon entering room, iv team nurse at bedside attempting iv. Patient unresponsive, no respirations or pulse felt. CPR initiated by myself and called for a code blue. Code team arrived in a matter of minutes.

## 2019-01-04 NOTE — Progress Notes (Signed)
Code was ended by ER doctor Lenise Arena. Patients death was pronounced at 21.  Postmortem care initiated by staff, family outside the room with chaplin.

## 2019-01-04 NOTE — Progress Notes (Signed)
Per dr. Jerelyn Charles okay to order tylenol 650mg  rectal prn. No blood cultures due to patient having pneumonia and on antibiotics

## 2019-01-04 NOTE — Progress Notes (Signed)
[place] [facility] was called. Patient is not a candidate per [place] [facility]. Laurie Steele was representative service number [phone]

## 2019-01-04 NOTE — Progress Notes (Addendum)
[facility] [place] NAME: Laurie Steele    MR#:  [id number]  DATE OF BIRTH:  11-Aug-1941  Patient seen and evaluated today at dialysis Has generalized weakness Lethargic again opens eyes to loud verbal commands Has cough CT head no acute abnormality  CHIEF COMPLAINT:   Patient is encephalopathic/poor historian, the patient's sister at the bedside, discussed case with the patient's son via phone, discussed with nursing staff, discussed with nephrology-patient's clinical course continues to deteriorate, palliative care/hospice was discussed with all questions answered, advanced directives discussed as well-family to take time to consider options, patient was made n.p.o. given continued problems with oral secretions, consult gastroenterology per family request for possible PEG tube  REVIEW OF SYSTEMS:  Could not be obtained secondary to lethargy and confusion  DRUG ALLERGIES:   Allergies  Allergen Reactions  . Alprazolam Other (See Comments)    Unable to sleep and confusion    VITALS:  Blood pressure (!) 125/56, pulse 95, temperature (!) 101.2 F (38.4 C), temperature source Oral, resp. rate 16, height 5\' 7"  (1.702 m), weight 80.3 kg, SpO2 100 %.  PHYSICAL EXAMINATION:  GENERAL:  78 y.o.-year-old patient lying in the bed with no acute distress.  EYES: Pupils equal, round, reactive to light  No scleral icterus. Extraocular muscles intact.  HEENT: Head atraumatic, normocephalic. Oropharynx and nasopharynx clear.  NECK:  Supple, no jugular venous distention. No thyroid enlargement, no tenderness.  LUNGS: Normal breath sounds bilaterally, no wheezing,scattered  Rales, No rhonchi or crepitation. No use of accessory muscles of respiration.  CARDIOVASCULAR: S1, S2 regular.  No murmurs, rubs, or gallops.  ABDOMEN: Soft, nontender, nondistended. Bowel sounds present. No organomegaly or mass.  EXTREMITIES: No pedal edema, cyanosis, or  clubbing.  NEUROLOGIC: No gross neurological deficit is observed.,. PSYCHIATRIC: Alert awake oriented x1 SKIN: No obvious rash, lesion, or ulcer.    LABORATORY PANEL:   CBC Recent Labs  Lab Jan 03, 2019 0539  WBC 12.5*  HGB 7.3*  HCT 23.1*  PLT 188   ------------------------------------------------------------------------------------------------------------------  Chemistries  Recent Labs  Lab 12/05/18 1533 01/03/2019 0539  NA  --  137  K  --  5.6*  CL  --  97*  CO2  --  23  GLUCOSE  --  208*  BUN  --  41*  CREATININE  --  5.85*  CALCIUM  --  9.9  AST 160*  --   ALT 21  --   ALKPHOS 88  --   BILITOT 0.8  --    ------------------------------------------------------------------------------------------------------------------  Cardiac Enzymes Recent Labs  Lab 12/04/18 1007  TROPONINI 4.55*   ------------------------------------------------------------------------------------------------------------------  RADIOLOGY:  Ct Head Wo Contrast  Result Date: 12/04/2018 CLINICAL DATA:  Altered mental status EXAM: CT HEAD WITHOUT CONTRAST TECHNIQUE: Contiguous axial images were obtained from the base of the skull through the vertex without intravenous contrast. COMPARISON:  05/22/2018 FINDINGS: Brain: There is no mass, hemorrhage or extra-axial collection. The size and configuration of the ventricles and extra-axial CSF spaces are normal. There is hypoattenuation of the white matter, most commonly indicating chronic small vessel disease. Vascular: Atherosclerotic calcification of the vertebral and internal carotid arteries at the skull base. No abnormal hyperdensity of the major intracranial arteries or dural venous sinuses. Skull: The visualized skull base, calvarium and extracranial soft tissues are normal. Sinuses/Orbits: No fluid levels or advanced mucosal thickening of the visualized paranasal sinuses. No mastoid or middle ear effusion. The orbits are normal. IMPRESSION: 1. No  acute  intracranial abnormality. 2. Findings of chronic small vessel disease. Electronically Signed   By: Ulyses Jarred M.D.   On: 12/04/2018 13:53    EKG:   Orders placed or performed during the hospital encounter of 11/23/2018  . EKG 12-Lead  . EKG 12-Lead  . EKG 12-Lead  . EKG 12-Lead  . EKG 12-Lead  . EKG 12-Lead  . EKG 12-Lead  . EKG 12-Lead  . EKG 12-Lead in am (before 8am)  . EKG 12-Lead in am (before 8am)  . EKG 12-Lead  . EKG 12-Lead    ASSESSMENT AND PLAN:  78 year old female patient with essential hypertension, ESRD on hemodialysis Monday, Wednesday, Friday brought in because of significant weakness.  *Acute HCAP Stable Secondary to aspiration pneumonia  Continue empiric vancomycin/cefepime, follow-up on cultures, speech therapy input appreciated Noted problems with oral secretions-start scopolamine patch  *Acute toxic metabolic encephalopathy  Continued worsening noted Suspected due to pneumonia and urinary tract infection CT head no acute abnormality  *Acute sepsis  Resolving secondary to pneumonia and UTI Continue sepsis protocol, empiric antibiotics per above, follow-up on cultures   *Acute symptomatic third-degree AV block/bradycardia Stable Status post pace maker placement AV nodal blocking agents discontinued, cardiology following  *Acute atypical chest pain with elevated troponins Suspect due to demand ischemia due to sepsis/pneumonia/UTI cardiology following-recommended discontinuation of heparin drip  Continue aspirin, statin therapy  *ESRD on hemodialysis Monday, Wednesday, Friday Case discussed with nephrology this morning-prognosis is poor given continued general decline  *History of CAD, EF 45%, has history of anteroseptal hypokinesia Stable Continue aspirin, Plavix, statin  *History of gout Stable continue allopurinol, as needed colchicine.  *Essential hypertension continue Norvasc, discontinued clonidine/Toprol due to  bradycardia  *COPD without exacerbation stable  continue DuoNebs as needed  *Secondary hyperparathyroidism Continue sevelamer with meals  *Anemia of chronic disease Continue EPO shots with hemodialysis treatment  *Chronic diabetes mellitus type 2  Plan scale insulin Accu-Cheks per routine   Prognosis poor  Condition guarded  Case discussed with the patient's sister as well as the patient's son Laurie Steele-palliative care/hospice/advanced directives discussed with all questions answered given multiorgan system failure, family request GI consultation for feeding tube despite patient being a poor candidate given tenuous medical condition  All the records are reviewed and case discussed with Care Management/Social Worker Management plans discussed with the patient, family and they are in agreement.  CODE STATUS: Full code  TOTAL TIME TAKING CARE OF THIS PATIENT: 35 minutes.   POSSIBLE D/C 2-10 DAYS, DEPENDING ON CLINICAL CONDITION.  More than 50% time spent in counseling, coordination of care  Avel Peace Kirah Stice M.D on 12/07/2018 at 12:29 PM  Between 7am to 6pm - Pager - [phone]  After 6pm go to [email or web address] - password [email or web address] [facility]  Office  [phone]  CC: Primary care physician; [name], MD   Note: This dictation was prepared with Dragon dictation along with smaller phrase technology. Any transcriptional errors that result from this process are unintentional.

## 2019-01-04 NOTE — Consult Note (Signed)
Code Blue called during PIV attempt, unable to obtain even with [place],  MD in to start EJ

## 2019-01-04 DEATH — deceased

## 2019-11-25 IMAGING — CT CT HEAD W/O CM
3 of 4 series · 15 of 47 positions shown, 18 images · non-contrast
Comparison: 05/22/2018

CLINICAL DATA: Altered mental status

EXAM:
CT HEAD WITHOUT CONTRAST
TECHNIQUE: Contiguous axial images were obtained from the base of the skull
through the vertex without intravenous contrast.

[Series 3: head wo · axial · 0.42mm/px · z∈[-146,-26]mm · 9 of 29 slices shown, 12 images]
[im 3/29  brain]
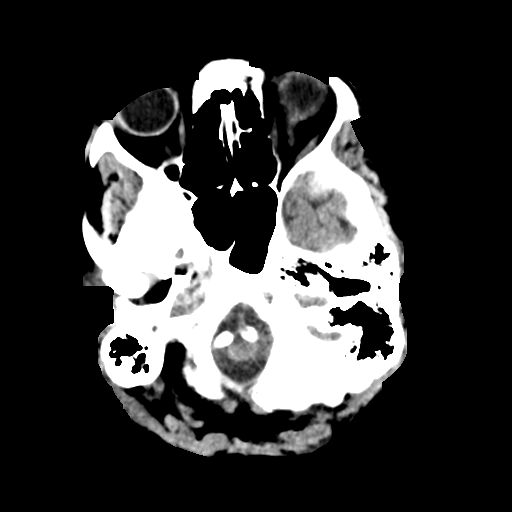
[im 3/29  bone]
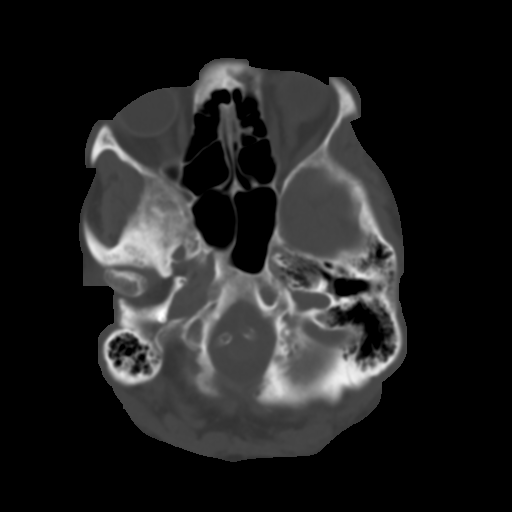
[im 7/29  brain]
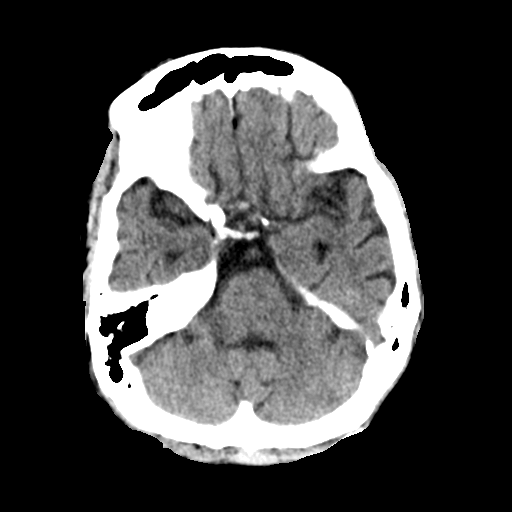
[im 9/29  brain]
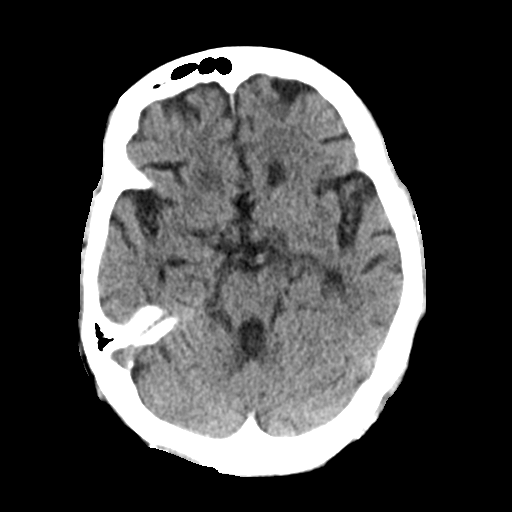
[im 13/29  brain]
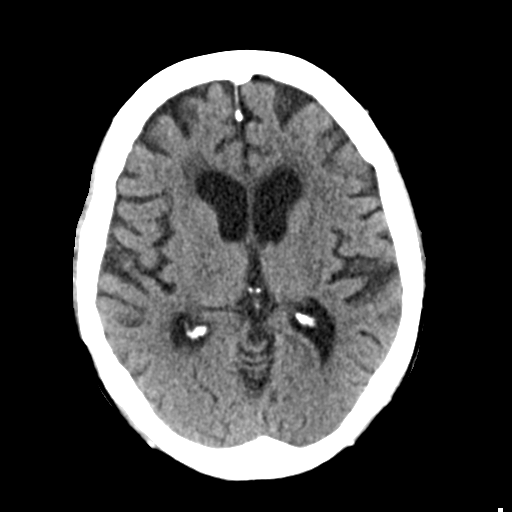
[im 15/29  brain]
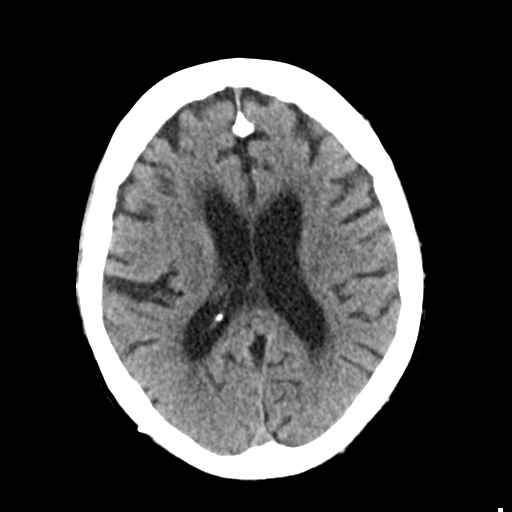
[im 15/29  bone]
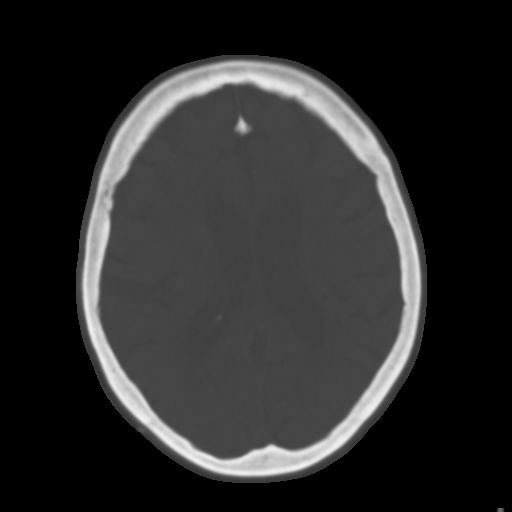
[im 17/29  brain]
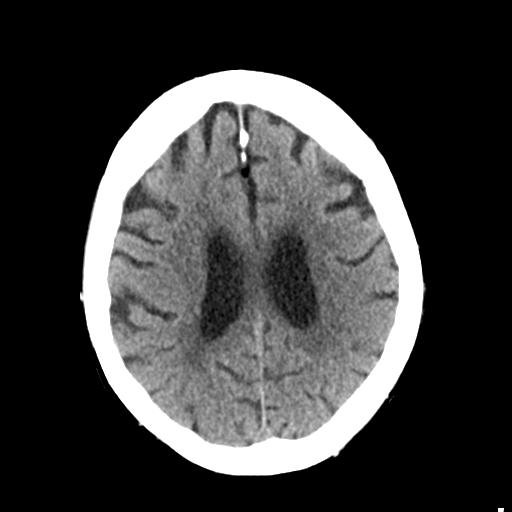
[im 21/29  brain]
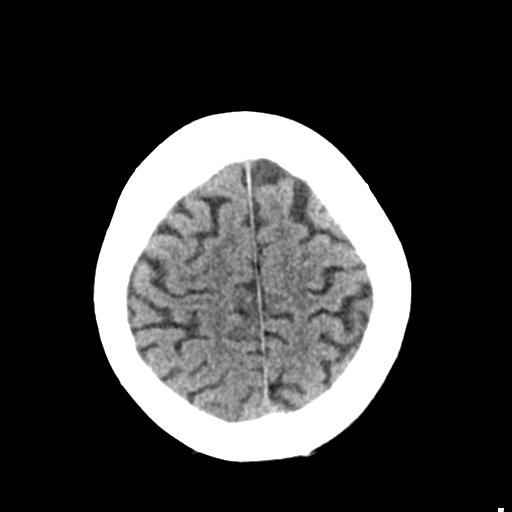
[im 23/29  brain]
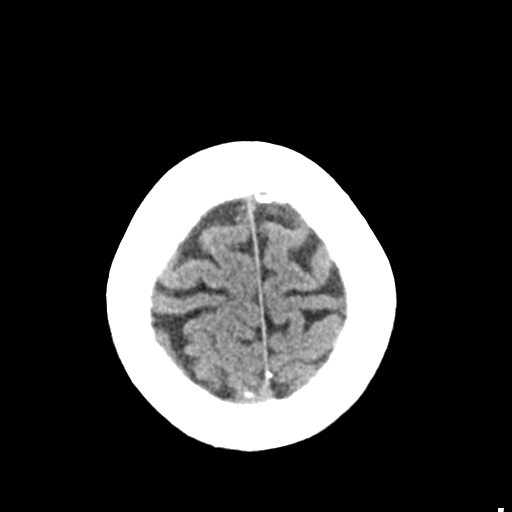
[im 27/29  brain]
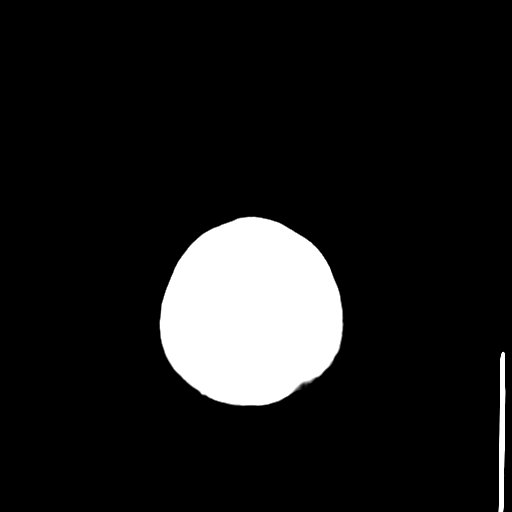
[im 27/29  bone]
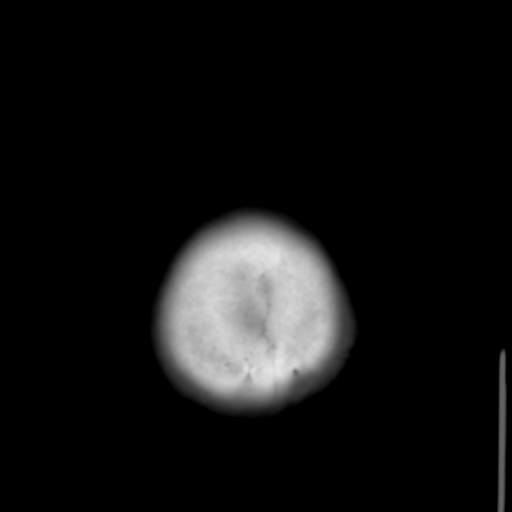

[Series 4: coronal soft tissue · coronal · 0.32mm/px · 3 of 64 slices shown]
[im 22/64  brain]
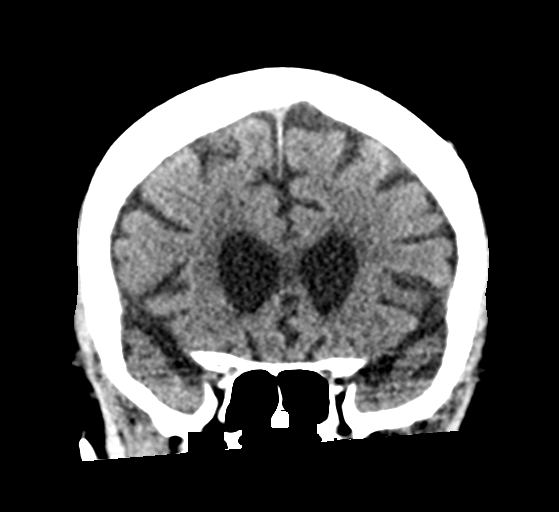
[im 29/64  brain]
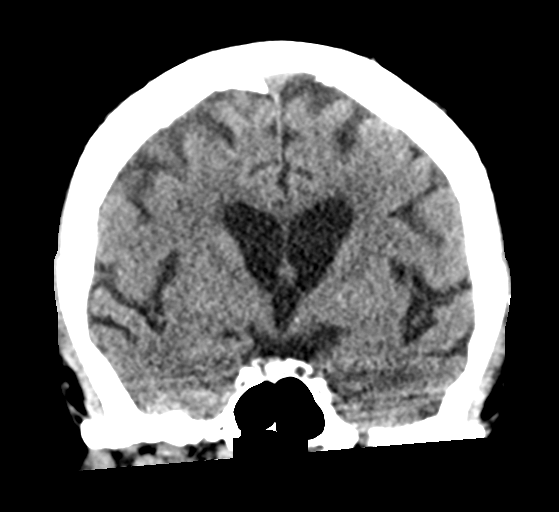
[im 36/64  brain]
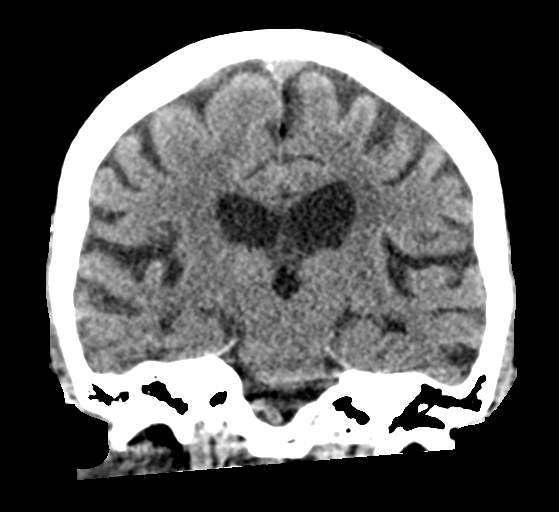

[Series 5: sagittal soft tissue · sagittal · 0.33mm/px · 3 of 54 slices shown]
[im 18/54  brain]
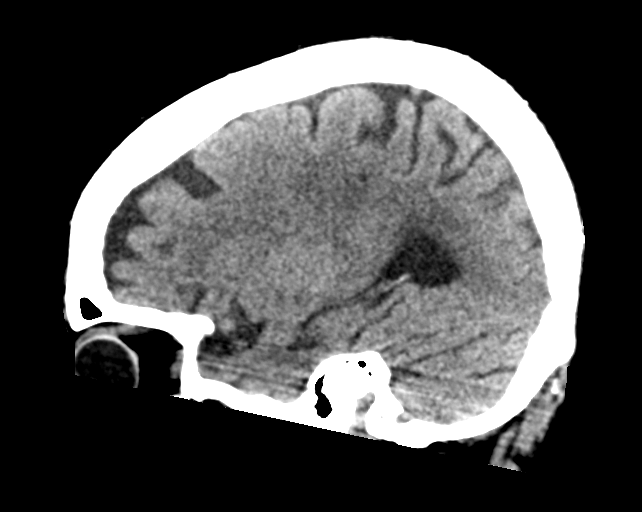
[im 27/54  brain]
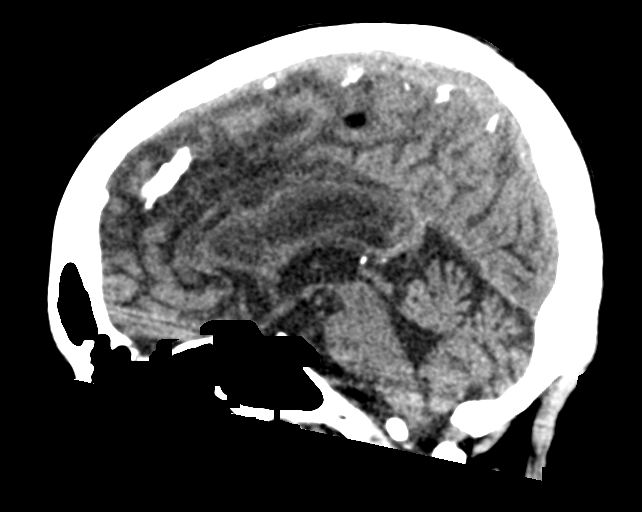
[im 36/54  brain]
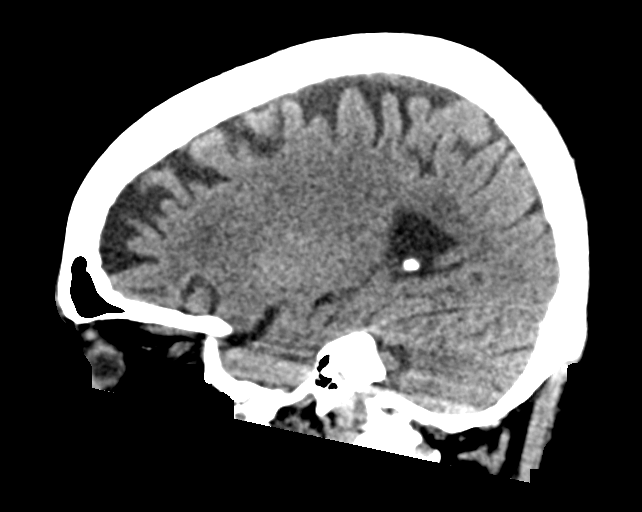

[15 of 47 positions shown; findings below may reference images not displayed]

FINDINGS: Brain: There is no mass, hemorrhage or extra-axial collection. The
size and configuration of the ventricles and extra-axial CSF spaces
are normal. There is hypoattenuation of the white matter, most
commonly indicating chronic small vessel disease.

Vascular: Atherosclerotic calcification of the vertebral and
internal carotid arteries at the skull base. No abnormal
hyperdensity of the major intracranial arteries or dural venous
sinuses.

Skull: The visualized skull base, calvarium and extracranial soft
tissues are normal.

Sinuses/Orbits: No fluid levels or advanced mucosal thickening of
the visualized paranasal sinuses. No mastoid or middle ear effusion.
The orbits are normal.
IMPRESSION: 1. No acute intracranial abnormality.
2. Findings of chronic small vessel disease.
